# Patient Record
Sex: Female | Born: 1950
Health system: Southern US, Community
[De-identification: ages and names within clinical notes are randomized; demographics above are authoritative.]

## PROBLEM LIST (undated history)

## (undated) DIAGNOSIS — K219 Gastro-esophageal reflux disease without esophagitis: Secondary | ICD-10-CM

## (undated) DIAGNOSIS — I5022 Chronic systolic (congestive) heart failure: Secondary | ICD-10-CM

## (undated) DIAGNOSIS — H269 Unspecified cataract: Secondary | ICD-10-CM

## (undated) DIAGNOSIS — I341 Nonrheumatic mitral (valve) prolapse: Secondary | ICD-10-CM

## (undated) DIAGNOSIS — I219 Acute myocardial infarction, unspecified: Secondary | ICD-10-CM

## (undated) DIAGNOSIS — G4733 Obstructive sleep apnea (adult) (pediatric): Secondary | ICD-10-CM

## (undated) DIAGNOSIS — F419 Anxiety disorder, unspecified: Secondary | ICD-10-CM

## (undated) DIAGNOSIS — Z974 Presence of external hearing-aid: Secondary | ICD-10-CM

## (undated) DIAGNOSIS — M5135 Other intervertebral disc degeneration, thoracolumbar region: Secondary | ICD-10-CM

## (undated) DIAGNOSIS — K7581 Nonalcoholic steatohepatitis (NASH): Secondary | ICD-10-CM

## (undated) DIAGNOSIS — K7402 Hepatic fibrosis, advanced fibrosis: Secondary | ICD-10-CM

## (undated) DIAGNOSIS — Z87442 Personal history of urinary calculi: Secondary | ICD-10-CM

## (undated) DIAGNOSIS — D869 Sarcoidosis, unspecified: Secondary | ICD-10-CM

## (undated) DIAGNOSIS — I252 Old myocardial infarction: Secondary | ICD-10-CM

## (undated) DIAGNOSIS — K760 Fatty (change of) liver, not elsewhere classified: Secondary | ICD-10-CM

## (undated) DIAGNOSIS — I10 Essential (primary) hypertension: Secondary | ICD-10-CM

## (undated) DIAGNOSIS — R748 Abnormal levels of other serum enzymes: Secondary | ICD-10-CM

## (undated) DIAGNOSIS — Z8719 Personal history of other diseases of the digestive system: Secondary | ICD-10-CM

## (undated) DIAGNOSIS — R7303 Prediabetes: Secondary | ICD-10-CM

## (undated) DIAGNOSIS — Z9889 Other specified postprocedural states: Secondary | ICD-10-CM

## (undated) DIAGNOSIS — K8020 Calculus of gallbladder without cholecystitis without obstruction: Secondary | ICD-10-CM

## (undated) DIAGNOSIS — C4490 Unspecified malignant neoplasm of skin, unspecified: Secondary | ICD-10-CM

## (undated) DIAGNOSIS — I4510 Unspecified right bundle-branch block: Secondary | ICD-10-CM

## (undated) DIAGNOSIS — I2510 Atherosclerotic heart disease of native coronary artery without angina pectoris: Secondary | ICD-10-CM

## (undated) DIAGNOSIS — M1990 Unspecified osteoarthritis, unspecified site: Secondary | ICD-10-CM

## (undated) DIAGNOSIS — T7840XA Allergy, unspecified, initial encounter: Secondary | ICD-10-CM

## (undated) HISTORY — PX: CARDIAC CATHETERIZATION: SHX172

## (undated) HISTORY — DX: Hepatic fibrosis, advanced fibrosis: K74.02

## (undated) HISTORY — DX: Atherosclerotic heart disease of native coronary artery without angina pectoris: I25.10

## (undated) HISTORY — DX: Unspecified right bundle-branch block: I45.10

## (undated) HISTORY — DX: Nonalcoholic steatohepatitis (NASH): K75.81

## (undated) HISTORY — DX: Calculus of gallbladder without cholecystitis without obstruction: K80.20

## (undated) HISTORY — DX: Fatty (change of) liver, not elsewhere classified: K76.0

## (undated) HISTORY — DX: Obstructive sleep apnea (adult) (pediatric): G47.33

## (undated) HISTORY — PX: ROTATOR CUFF REPAIR: SHX139

## (undated) HISTORY — DX: Presence of external hearing-aid: Z97.4

## (undated) HISTORY — DX: Old myocardial infarction: I25.2

## (undated) HISTORY — DX: Anxiety disorder, unspecified: F41.9

## (undated) HISTORY — DX: Chronic systolic (congestive) heart failure: I50.22

## (undated) HISTORY — DX: Other intervertebral disc degeneration, thoracolumbar region: M51.35

## (undated) HISTORY — DX: Nonrheumatic mitral (valve) prolapse: I34.1

## (undated) HISTORY — DX: Sarcoidosis, unspecified: D86.9

## (undated) HISTORY — DX: Allergy, unspecified, initial encounter: T78.40XA

## (undated) HISTORY — DX: Abnormal levels of other serum enzymes: R74.8

---

## 1995-11-15 HISTORY — PX: BLADDER SURGERY: SHX569

## 1998-06-25 ENCOUNTER — Other Ambulatory Visit: Admission: RE | Admit: 1998-06-25 | Discharge: 1998-06-25 | Payer: Self-pay | Admitting: Obstetrics & Gynecology

## 1999-09-24 ENCOUNTER — Other Ambulatory Visit: Admission: RE | Admit: 1999-09-24 | Discharge: 1999-09-24 | Payer: Self-pay | Admitting: Obstetrics and Gynecology

## 2000-08-19 ENCOUNTER — Ambulatory Visit (HOSPITAL_COMMUNITY): Admission: RE | Admit: 2000-08-19 | Discharge: 2000-08-19 | Payer: Self-pay | Admitting: Family Medicine

## 2000-08-19 ENCOUNTER — Encounter: Payer: Self-pay | Admitting: Family Medicine

## 2000-10-07 ENCOUNTER — Other Ambulatory Visit: Admission: RE | Admit: 2000-10-07 | Discharge: 2000-10-07 | Payer: Self-pay | Admitting: Obstetrics and Gynecology

## 2001-08-16 HISTORY — PX: KNEE SURGERY: SHX244

## 2001-10-20 ENCOUNTER — Other Ambulatory Visit: Admission: RE | Admit: 2001-10-20 | Discharge: 2001-10-20 | Payer: Self-pay | Admitting: Obstetrics and Gynecology

## 2002-03-16 ENCOUNTER — Ambulatory Visit (HOSPITAL_BASED_OUTPATIENT_CLINIC_OR_DEPARTMENT_OTHER): Admission: RE | Admit: 2002-03-16 | Discharge: 2002-03-16 | Payer: Self-pay | Admitting: Orthopedic Surgery

## 2002-11-29 ENCOUNTER — Ambulatory Visit (HOSPITAL_COMMUNITY): Admission: RE | Admit: 2002-11-29 | Discharge: 2002-11-29 | Payer: Self-pay | Admitting: Obstetrics and Gynecology

## 2002-11-29 ENCOUNTER — Encounter: Payer: Self-pay | Admitting: Obstetrics and Gynecology

## 2003-08-17 HISTORY — PX: SHOULDER SURGERY: SHX246

## 2004-07-13 ENCOUNTER — Ambulatory Visit: Payer: Self-pay | Admitting: Family Medicine

## 2004-07-20 ENCOUNTER — Ambulatory Visit: Payer: Self-pay | Admitting: Internal Medicine

## 2005-02-02 ENCOUNTER — Ambulatory Visit: Payer: Self-pay | Admitting: Family Medicine

## 2005-02-10 ENCOUNTER — Ambulatory Visit: Payer: Self-pay | Admitting: Family Medicine

## 2005-02-18 ENCOUNTER — Ambulatory Visit: Payer: Self-pay | Admitting: Family Medicine

## 2005-03-09 ENCOUNTER — Ambulatory Visit: Payer: Self-pay | Admitting: Family Medicine

## 2005-06-08 ENCOUNTER — Ambulatory Visit: Payer: Self-pay | Admitting: Family Medicine

## 2005-06-22 ENCOUNTER — Ambulatory Visit: Payer: Self-pay | Admitting: Family Medicine

## 2006-03-16 LAB — HM MAMMOGRAPHY: HM Mammogram: NORMAL

## 2006-03-29 ENCOUNTER — Ambulatory Visit: Payer: Self-pay | Admitting: Family Medicine

## 2006-04-29 ENCOUNTER — Ambulatory Visit: Payer: Self-pay | Admitting: Family Medicine

## 2006-11-08 ENCOUNTER — Ambulatory Visit: Payer: Self-pay | Admitting: Family Medicine

## 2006-11-10 ENCOUNTER — Ambulatory Visit: Payer: Self-pay | Admitting: Family Medicine

## 2006-11-11 DIAGNOSIS — M949 Disorder of cartilage, unspecified: Secondary | ICD-10-CM

## 2006-11-11 DIAGNOSIS — M899 Disorder of bone, unspecified: Secondary | ICD-10-CM

## 2009-06-19 ENCOUNTER — Ambulatory Visit: Payer: Self-pay | Admitting: Family Medicine

## 2009-06-19 DIAGNOSIS — N390 Urinary tract infection, site not specified: Secondary | ICD-10-CM

## 2009-06-19 LAB — CONVERTED CEMR LAB
Bilirubin Urine: NEGATIVE
Glucose, Urine, Semiquant: NEGATIVE
Ketones, urine, test strip: NEGATIVE
Nitrite: POSITIVE
Protein, U semiquant: 30
Specific Gravity, Urine: 1.015
Urobilinogen, UA: 0.2
pH: 6

## 2009-06-27 DIAGNOSIS — I059 Rheumatic mitral valve disease, unspecified: Secondary | ICD-10-CM

## 2009-06-27 DIAGNOSIS — J309 Allergic rhinitis, unspecified: Secondary | ICD-10-CM

## 2009-06-27 DIAGNOSIS — K649 Unspecified hemorrhoids: Secondary | ICD-10-CM | POA: Insufficient documentation

## 2009-06-27 DIAGNOSIS — F411 Generalized anxiety disorder: Secondary | ICD-10-CM

## 2009-06-27 DIAGNOSIS — I2510 Atherosclerotic heart disease of native coronary artery without angina pectoris: Secondary | ICD-10-CM

## 2010-01-30 ENCOUNTER — Emergency Department (HOSPITAL_COMMUNITY): Admission: EM | Admit: 2010-01-30 | Discharge: 2010-01-30 | Payer: Self-pay | Admitting: Emergency Medicine

## 2010-08-27 ENCOUNTER — Other Ambulatory Visit: Payer: Self-pay | Admitting: Family Medicine

## 2010-08-27 ENCOUNTER — Ambulatory Visit
Admission: RE | Admit: 2010-08-27 | Discharge: 2010-08-27 | Payer: Self-pay | Source: Home / Self Care | Attending: Family Medicine | Admitting: Family Medicine

## 2010-08-27 DIAGNOSIS — N949 Unspecified condition associated with female genital organs and menstrual cycle: Secondary | ICD-10-CM | POA: Insufficient documentation

## 2010-08-27 DIAGNOSIS — I10 Essential (primary) hypertension: Secondary | ICD-10-CM | POA: Insufficient documentation

## 2010-08-27 LAB — BASIC METABOLIC PANEL
BUN: 14 mg/dL (ref 6–23)
CO2: 30 mEq/L (ref 19–32)
Calcium: 9.4 mg/dL (ref 8.4–10.5)
Chloride: 100 mEq/L (ref 96–112)
Creatinine, Ser: 0.8 mg/dL (ref 0.4–1.2)
GFR: 77.27 mL/min (ref >60.00)
Glucose, Bld: 94 mg/dL (ref 70–99)
Potassium: 3.2 mEq/L — ABNORMAL LOW (ref 3.5–5.1)
Sodium: 140 mEq/L (ref 135–145)

## 2010-08-27 LAB — LIPID PANEL
Cholesterol: 182 mg/dL (ref 0–200)
HDL: 29 mg/dL — ABNORMAL LOW (ref >39.00)
LDL Cholesterol: 128 mg/dL — ABNORMAL HIGH (ref 0–99)
Total CHOL/HDL Ratio: 6
Triglycerides: 126 mg/dL (ref 0.0–149.0)
VLDL: 25.2 mg/dL (ref 0.0–40.0)

## 2010-09-01 ENCOUNTER — Encounter: Payer: Self-pay | Admitting: Family Medicine

## 2010-09-17 NOTE — Assessment & Plan Note (Signed)
Summary: CPX,TRANSFER FROM [facility]   Vital Signs:  Patient profile:   60 year old female Height:      62.25 inches Weight:      153.50 pounds BMI:     27.95 Temp:     98.1 degrees F oral Pulse rate:   63 / minute Pulse rhythm:   regular BP sitting:   140 / 90  (left arm) Cuff size:   regular  Vitals Entered By: Sherrian Divers CMA Deborra Medina) (August 27, 2010 8:25 AM) CC: complete physicial, transfer from [place]   History of Present Illness: 60 yo new to me here for CPX.  Was seeing [facility] years ago, last 2-3 years has been seeing Dr. Tamala Julian with [facility].  Well woman- s/p hysterectomy in 2007 for fibroids. Mammogram scheduled for next week. UTD on vaccinations (awaiting records) and had a colonoscopy in 2008.  Has had some vulvular irriation lately.  Increase in urge incontinence.  s/p bladder sling in 2007.  She is using new panty liners and wonders if that is the cause.  HTN- on Metoprolol 100 mg daily, HCTZ 12. 5 mg daily.  Has not had her medication today. NO CP, blurred vision or SOB.  No LE edema.  Osteopenia- stopped taking her Boniva because it was too expensive.  Supposed to be taking Fosamax but kept forgetting, needs refills.  Not sure when last DEXA was,.  Current Medications (verified): 1)  Glucosamine-Chondroitin  Caps (Glucosamine-Chondroit-Vit C-Mn) .... Otc As Directed. 2)  Calcium Carbonate-Vitamin D 600-400 Mg-Unit  Tabs (Calcium Carbonate-Vitamin D) .... One Daily 3)  Multivitamins   Tabs (Multiple Vitamin) .... One Daily 4)  Metoprolol Tartrate 100 Mg Tabs (Metoprolol Tartrate) .... Take One Tablet By Mouth Daily 5)  Hydrochlorothiazide 25 Mg Tabs (Hydrochlorothiazide) .... Take 1/2 Tablet By Mouth Daily 6)  Meloxicam 15 Mg Tabs (Meloxicam) .... Take One Tablet By Mouth Daily 7)  Ciprofloxacin Hcl 500 Mg Tabs (Ciprofloxacin Hcl) .... Take 1 Two Times A Day For Uti 8)  Fish Oil 1200 Mg Caps (Omega-3 Fatty Acids) .... Take One Tablet By Mouth  Daily 9)  Vitamin D3 1000 Unit Tabs (Cholecalciferol) .... Take One Tablet By Mouth Daily 10)  Claritin 10 Mg Tabs (Loratadine) .... Take One Tablet By Mouth Daily 11)  Womens Multivitamin Plus  Tabs (Multiple Vitamins-Minerals) .... Take One Tablet By Mouth Daily 12)  Clobetasol Propionate 0.05 % Crea (Clobetasol Propionate) .... Apply On Area Nightly X 4 Weeks or Until Rash Has Resolved. 13)  Alendronate Sodium 70 Mg  Tabs (Alendronate Sodium) .Marland Kitchen.. 1 By Mouth Q Week . Take With 8 Ounces of Water On and Empty Stomach  Allergies: 1)  ! Codeine 2)  ! Penicillin  Past History:  Past Medical History: Last updated: 06/27/2009 Mitral valve prolapse hemorrhoids CAD allergic rhinitis anxiety osteopenia 11/11/2006  Past Surgical History: Last updated: 06/27/2009 04-17-97 Echo EF 55% trivial MVP ant leaflet normal LVF 11-15-1995 TAH -HRT 11-15-1995 Anterior bladder neck suspension, stress urinary incontinence 12-14-1992 2 D Echo 1989 Flex sig 09-16-2000 Treadmill stress + 09-16-2000 Cardiolite negative 2003 Knee surgery torn meniscus 12-15-2002 colonoscopy 11-11-2006 Dexa  Family History: Last updated: 06/27/2009 Father: Hypertension Mother: MVP Siblings: one brother- no health issues noted  Social History: Last updated: 06/19/2009 Marital Status: Married--divorced Children:  Occupation:worked  at [employer], now in peds at front desk  Risk Factors: Smoking Status: never (06/27/2009)  Review of Systems      See HPI General:  Denies malaise. Eyes:  Denies blurring.  ENT:  Denies difficulty swallowing. CV:  Denies chest pain or discomfort. Resp:  Denies shortness of breath. GI:  Denies abdominal pain. GU:  Complains of nocturia and urinary frequency. MS:  Denies joint pain, joint redness, and joint swelling. Derm:  Complains of rash. Neuro:  Denies headaches. Psych:  Denies anxiety and depression. Endo:  Denies cold intolerance and heat intolerance. Heme:  Denies  abnormal bruising and bleeding.  Physical Exam  General:  alert, well-developed, well-nourished, and well-hydrated.   Head:  normocephalic and atraumatic.   Eyes:  vision grossly intact, pupils equal, pupils round, and pupils reactive to light.   Ears:  R ear normal and L ear normal.   Nose:  nose piercing noted.   Mouth:  good dentition.   Neck:  No deformities, masses, or tenderness noted. Lungs:  Normal respiratory effort, chest expands symmetrically. Lungs are clear to auscultation, no crackles or wheezes. Heart:  normal rate, regular rhythm, and no murmur.   Abdomen:  Bowel sounds positive,abdomen soft and non-tender without masses, organomegaly or hernias noted. Rectal:  no external abnormalities.   Genitalia:  irritated, raised plaque on left vuluvlar area.no vaginal discharge and mucosa pink and moist.   Msk:  No deformity or scoliosis noted of thoracic or lumbar spine.   Extremities:  no edema Neurologic:  alert & oriented X3 and gait normal.   Skin:  Intact without suspicious lesions or rashes Psych:  normally interactive and subdued.     Impression & Recommendations:  Problem # 1:  Preventive Health Care (ICD-V70.0) Reviewed preventive care protocols, scheduled due services, and updated immunizations Discussed nutrition, exercise, diet, and healthy lifestyle.  Awaiting records, will check labs today.  Problem # 2:  HYPERTENSION, BENIGN (ICD-401.1) Assessment: Unchanged BMET today. Her updated medication list for this problem includes:    Metoprolol Tartrate 100 Mg Tabs (Metoprolol tartrate) .Marland Kitchen... Take one tablet by mouth daily    Hydrochlorothiazide 25 Mg Tabs (Hydrochlorothiazide) .Marland Kitchen... Take 1/2 tablet by mouth daily  Orders: Venipuncture (49449) TLB-BMP (Basic Metabolic Panel-BMET) ([id number])  Problem # 3:  OSTEOPENIA (ICD-733.90)  The following medications were removed from the medication list:    Boniva 150 Mg Tabs (Ibandronate sodium) ..... One   tablet monthly Her updated medication list for this problem includes:    Calcium Carbonate-vitamin D 600-400 Mg-unit Tabs (Calcium carbonate-vitamin d) ..... One daily    Vitamin D3 1000 Unit Tabs (Cholecalciferol) .Marland Kitchen... Take one tablet by mouth daily    Alendronate Sodium 70 Mg Tabs (Alendronate sodium) .Marland Kitchen... 1 by mouth q week . take with 8 ounces of water on and empty stomach  Problem # 4:  OSTEOPENIA (ICD-733.90) Assessment: Unchanged refilled her Alendronate.  Likely needs repeat DEXA, awaiting records. The following medications were removed from the medication list:    Boniva 150 Mg Tabs (Ibandronate sodium) ..... One  tablet monthly Her updated medication list for this problem includes:    Calcium Carbonate-vitamin D 600-400 Mg-unit Tabs (Calcium carbonate-vitamin d) ..... One daily    Vitamin D3 1000 Unit Tabs (Cholecalciferol) .Marland Kitchen... Take one tablet by mouth daily    Alendronate Sodium 70 Mg Tabs (Alendronate sodium) .Marland Kitchen... 1 by mouth q week . take with 8 ounces of water on and empty stomach  Problem # 5:  UNSPEC SYMPTOM ASSOC W/FEMALE GENITAL ORGANS (ICD-625.9) Assessment: New ? lichen planus vs contact dermatitis. advised using scented free panty liners, clobetasol nightly.  Follow up in 1-2 months.  Complete Medication List: 1)  Glucosamine-chondroitin Caps (Glucosamine-chondroit-vit  c-mn) .... Otc as directed. 2)  Calcium Carbonate-vitamin D 600-400 Mg-unit Tabs (Calcium carbonate-vitamin d) .... One daily 3)  Multivitamins Tabs (Multiple vitamin) .... One daily 4)  Metoprolol Tartrate 100 Mg Tabs (Metoprolol tartrate) .... Take one tablet by mouth daily 5)  Hydrochlorothiazide 25 Mg Tabs (Hydrochlorothiazide) .... Take 1/2 tablet by mouth daily 6)  Meloxicam 15 Mg Tabs (Meloxicam) .... Take one tablet by mouth daily 7)  Ciprofloxacin Hcl 500 Mg Tabs (Ciprofloxacin hcl) .... Take 1 two times a day for uti 8)  Fish Oil 1200 Mg Caps (Omega-3 fatty acids) .... Take one tablet by  mouth daily 9)  Vitamin D3 1000 Unit Tabs (Cholecalciferol) .... Take one tablet by mouth daily 10)  Claritin 10 Mg Tabs (Loratadine) .... Take one tablet by mouth daily 11)  Womens Multivitamin Plus Tabs (Multiple vitamins-minerals) .... Take one tablet by mouth daily 12)  Clobetasol Propionate 0.05 % Crea (Clobetasol propionate) .... Apply on area nightly x 4 weeks or until rash has resolved. 13)  Alendronate Sodium 70 Mg Tabs (Alendronate sodium) .Marland Kitchen.. 1 by mouth q week . take with 8 ounces of water on and empty stomach  Other Orders: TLB-Lipid Panel ([id number])  Patient Instructions: 1)  Great to meet you. 2)  Please follow up in 1-2 months for your rash.   Prescriptions: ALENDRONATE SODIUM 70 MG  TABS (ALENDRONATE SODIUM) 1 by mouth q week . Take with 8 ounces of water on and empty stomach  #12 x 3   Entered and Authorized by:   Arnette Norris MD   Signed by:   Arnette Norris MD on 08/27/2010   Method used:   Electronically to        [facility] (retail)       [address]       [place], [place]  [address]       Ph: [phone]       Fax: [phone]   RxID:   [id number] CLOBETASOL PROPIONATE 0.05 % CREA (CLOBETASOL PROPIONATE) Apply on area nightly x 4 weeks or until rash has resolved.  #30 g x 0   Entered and Authorized by:   Arnette Norris MD   Signed by:   Arnette Norris MD on 08/27/2010   Method used:   Electronically to        [facility] (retail)       [address]       [place], [place]  [address]       Ph: [phone]       Fax: [phone]   RxID:   [id number]    Orders Added: 1)  Venipuncture [29924] 2)  TLB-Lipid Panel [[id number]] 3)  TLB-BMP (Basic Metabolic Panel-BMET) [[id number]] 4)  Est. Patient 40-64 years [99396] 5)  Est. Patient Level III [79892]    Current Allergies (reviewed today): ! CODEINE ! PENICILLIN  Herpes Zoster Result Date:  11/19/2009 Herpes Zoster Result:  historical Colonoscopy Result Date:  08/31/2006 Colonoscopy  Result:  historical PAP Next Due:  Not Indicated

## 2010-09-22 ENCOUNTER — Ambulatory Visit (INDEPENDENT_AMBULATORY_CARE_PROVIDER_SITE_OTHER): Payer: BC Managed Care – PPO | Admitting: Family Medicine

## 2010-09-22 ENCOUNTER — Encounter: Payer: Self-pay | Admitting: Family Medicine

## 2010-09-22 DIAGNOSIS — H5710 Ocular pain, unspecified eye: Secondary | ICD-10-CM | POA: Insufficient documentation

## 2010-09-23 NOTE — Miscellaneous (Signed)
Summary: ROI  ROI   Imported By: Jamelle Haring 09/18/2010 08:39:04  _____________________________________________________________________  External Attachment:    Type:   Image     Comment:   External Document

## 2010-09-25 ENCOUNTER — Telehealth: Payer: Self-pay | Admitting: Family Medicine

## 2010-10-01 NOTE — Assessment & Plan Note (Signed)
Summary: EYE/CLE  [employer]   Vital Signs:  Patient profile:   60 year old female Height:      62.25 inches Weight:      157.50 pounds BMI:     28.68 Temp:     98.6 degrees F oral Pulse rate:   67 / minute Pulse rhythm:   regular BP sitting:   110 / 84  (right arm) Cuff size:   regular  Vitals Entered By: Sherrian Divers CMA Deborra Medina) (September 22, 2010 8:20 AM) CC: eye irritated   History of Present Illness: 60 yo here for 1 week of right eye irritation.  Noticed it in the middle of the day. She wears contacts, so has not warn them for past few days. Conjunctiva is pink, sensitive to light, tearing a little. Yesterday, felt a little burred vision that has resolved.  Itches a little. No discharge.    Current Medications (verified): 1)  Glucosamine-Chondroitin  Caps (Glucosamine-Chondroit-Vit C-Mn) .... Otc As Directed. 2)  Calcium Carbonate-Vitamin D 600-400 Mg-Unit  Tabs (Calcium Carbonate-Vitamin D) .... One Daily 3)  Multivitamins   Tabs (Multiple Vitamin) .... One Daily 4)  Metoprolol Tartrate 100 Mg Tabs (Metoprolol Tartrate) .... Take One Tablet By Mouth Daily 5)  Hydrochlorothiazide 25 Mg Tabs (Hydrochlorothiazide) .... Take 1/2 Tablet By Mouth Daily 6)  Meloxicam 15 Mg Tabs (Meloxicam) .... Take One Tablet By Mouth Daily 7)  Ciprofloxacin Hcl 500 Mg Tabs (Ciprofloxacin Hcl) .... Take 1 Two Times A Day For Uti 8)  Fish Oil 1200 Mg Caps (Omega-3 Fatty Acids) .... Take One Tablet By Mouth Daily 9)  Vitamin D3 1000 Unit Tabs (Cholecalciferol) .... Take One Tablet By Mouth Daily 10)  Claritin 10 Mg Tabs (Loratadine) .... Take One Tablet By Mouth Daily 11)  Womens Multivitamin Plus  Tabs (Multiple Vitamins-Minerals) .... Take One Tablet By Mouth Daily 12)  Clobetasol Propionate 0.05 % Crea (Clobetasol Propionate) .... Apply On Area Nightly X 4 Weeks or Until Rash Has Resolved. 13)  Alendronate Sodium 70 Mg  Tabs (Alendronate Sodium) .Marland Kitchen.. 1 By Mouth Q Week . Take With 8 Ounces of  Water On and Empty Stomach 14)  Bleph-10 10 % Soln (Sulfacetamide Sodium) .Marland Kitchen.. 1-2 Drops in Affected Eye Three Times A Day X 7 Days  Allergies: 1)  ! Codeine 2)  ! Penicillin  Past History:  Past Medical History: Last updated: 06/27/2009 Mitral valve prolapse hemorrhoids CAD allergic rhinitis anxiety osteopenia 11/11/2006  Past Surgical History: Last updated: 06/27/2009 04-17-97 Echo EF 55% trivial MVP ant leaflet normal LVF 11-15-1995 TAH -HRT 11-15-1995 Anterior bladder neck suspension, stress urinary incontinence 12-14-1992 2 D Echo 1989 Flex sig 09-16-2000 Treadmill stress + 09-16-2000 Cardiolite negative 2003 Knee surgery torn meniscus 12-15-2002 colonoscopy 11-11-2006 Dexa  Family History: Last updated: 06/27/2009 Father: Hypertension Mother: MVP Siblings: one brother- no health issues noted  Social History: Last updated: 06/19/2009 Marital Status: Married--divorced Children:  Occupation:worked  at [employer], now in peds at front desk  Risk Factors: Smoking Status: never (06/27/2009)  Review of Systems      See HPI Eyes:  Complains of blurring, eye irritation, itching, and light sensitivity; denies discharge and double vision.  Physical Exam  General:  alert, well-developed, well-nourished, and well-hydrated.   Eyes:  Right conjunctival injection, some tearing. no photophobia. PERRL Psych:  normally interactive and subdued.     Impression & Recommendations:  Problem # 1:  EYE PAIN, RIGHT (ICD-379.91) Assessment New Most consistent with possible corneal abrasion.  Advised going  to see her optometrist right away for dilated eye exam.  Will give abx drops until she can see him. Red flags given requiring emergent follow up.  Complete Medication List: 1)  Glucosamine-chondroitin Caps (Glucosamine-chondroit-vit c-mn) .... Otc as directed. 2)  Calcium Carbonate-vitamin D 600-400 Mg-unit Tabs (Calcium carbonate-vitamin d) .... One daily 3)  Multivitamins  Tabs (Multiple vitamin) .... One daily 4)  Metoprolol Tartrate 100 Mg Tabs (Metoprolol tartrate) .... Take one tablet by mouth daily 5)  Hydrochlorothiazide 25 Mg Tabs (Hydrochlorothiazide) .... Take 1/2 tablet by mouth daily 6)  Meloxicam 15 Mg Tabs (Meloxicam) .... Take one tablet by mouth daily 7)  Ciprofloxacin Hcl 500 Mg Tabs (Ciprofloxacin hcl) .... Take 1 two times a day for uti 8)  Fish Oil 1200 Mg Caps (Omega-3 fatty acids) .... Take one tablet by mouth daily 9)  Vitamin D3 1000 Unit Tabs (Cholecalciferol) .... Take one tablet by mouth daily 10)  Claritin 10 Mg Tabs (Loratadine) .... Take one tablet by mouth daily 11)  Womens Multivitamin Plus Tabs (Multiple vitamins-minerals) .... Take one tablet by mouth daily 12)  Clobetasol Propionate 0.05 % Crea (Clobetasol propionate) .... Apply on area nightly x 4 weeks or until rash has resolved. 13)  Alendronate Sodium 70 Mg Tabs (Alendronate sodium) .Marland Kitchen.. 1 by mouth q week . take with 8 ounces of water on and empty stomach 14)  Bleph-10 10 % Soln (Sulfacetamide sodium) .Marland Kitchen.. 1-2 drops in affected eye three times a day x 7 days Prescriptions: BLEPH-10 10 % SOLN (SULFACETAMIDE SODIUM) 1-2 drops in affected eye three times a day x 7 days  #1 x 0   Entered and Authorized by:   Arnette Norris MD   Signed by:   Arnette Norris MD on 09/22/2010   Method used:   Electronically to        [facility] (retail)       [address]       [place], [place]  [address]       Ph: [phone]       Fax: [phone]   RxID:   [id number]    Orders Added: 1)  Est. Patient Level III [25427]    Current Allergies (reviewed today): ! CODEINE ! PENICILLIN

## 2010-10-01 NOTE — Progress Notes (Signed)
Summary: requests flonase, albuterol  Phone Note Refill Request Call back at Home Phone [phone] Message from:  Patient  Refills Requested: Medication #1:  albuterol inhaler  Medication #2:  flonase Phoned requests from pt.  She says she has gotten these in the past from [facility], they are not on med list.  Uses [facility].  OK to wait until 2/13.  Initial call taken by: Marty Heck CMA, AAMA,  September 25, 2010 11:35 AM    New/Updated Medications: FLUTICASONE PROPIONATE 50 MCG/ACT  SUSP (FLUTICASONE PROPIONATE) 2 sprays each nostril once daily PROAIR HFA 108 (90 BASE) MCG/ACT  AERS (ALBUTEROL SULFATE) 2 inh q4h as needed shortness of breath Prescriptions: PROAIR HFA 108 (90 BASE) MCG/ACT  AERS (ALBUTEROL SULFATE) 2 inh q4h as needed shortness of breath  #1 x 0   Entered and Authorized by:   Arnette Norris MD   Signed by:   Arnette Norris MD on 09/26/2010   Method used:   Electronically to        [facility] (retail)       [address]       [place], [place]  [address]       Ph: [phone]       Fax: [phone]   RxID:   [id number] FLUTICASONE PROPIONATE 50 MCG/ACT  SUSP (FLUTICASONE PROPIONATE) 2 sprays each nostril once daily  #1 vial x 3   Entered and Authorized by:   Arnette Norris MD   Signed by:   Arnette Norris MD on 09/26/2010   Method used:   Electronically to        [facility] (retail)       [address]       [place], [place]  [address]       Ph: [phone]       Fax: [phone]   RxID:   [id number]

## 2010-10-06 ENCOUNTER — Telehealth: Payer: Self-pay | Admitting: Family Medicine

## 2010-10-13 NOTE — Progress Notes (Signed)
Summary: regarding metoprolol  Phone Note Call from Patient Call back at Home Phone [phone]   Caller: Patient Summary of Call: Pt says when she got her metoprolol refilled it was for tartrate and she says she has always taken the succinate.  Chart says it was changed on 08/27/10, but I didnt see anything in the chart note from that date.  [facility]ses [facility]. Initial call taken by: Marty Heck CMA, [place],  October 06, 2010 9:26 AM  Follow-up for Phone Call        not sure why it was changed but should not cause harm.  Please refill her old refill and change in chart. Arnette Norris MD  October 06, 2010 9:30 AM  Spoke with April at [facility], according to there records patient has been getting succinate.  Refilled this medication # 30 with 6 refills and changed it on medication list.  Patient notified.  Follow-up by: Sherrian Divers CMA Deborra Medina),  October 06, 2010 9:49 AM    New/Updated Medications: METOPROLOL SUCCINATE 100 MG XR24H-TAB (METOPROLOL SUCCINATE) take one tablet by mouth daily  Current Allergies (reviewed today): ! CODEINE ! PENICILLIN

## 2010-10-14 ENCOUNTER — Encounter: Payer: Self-pay | Admitting: Family Medicine

## 2010-11-01 LAB — COMPREHENSIVE METABOLIC PANEL
ALT: 29 U/L (ref 0–35)
AST: 27 U/L (ref 0–37)
Albumin: 3.8 g/dL (ref 3.5–5.2)
Alkaline Phosphatase: 97 U/L (ref 39–117)
BUN: 14 mg/dL (ref 6–23)
Chloride: 98 mEq/L (ref 96–112)
GFR calc Af Amer: >60 mL/min (ref >60)
Potassium: 3.7 mEq/L (ref 3.5–5.1)
Sodium: 135 mEq/L (ref 135–145)
Total Bilirubin: 1.1 mg/dL (ref 0.3–1.2)

## 2010-11-01 LAB — URINALYSIS, ROUTINE W REFLEX MICROSCOPIC
Glucose, UA: NEGATIVE mg/dL
Hgb urine dipstick: NEGATIVE
Ketones, ur: NEGATIVE mg/dL
Protein, ur: NEGATIVE mg/dL
pH: 5.5 (ref 5.0–8.0)

## 2010-11-01 LAB — DIFFERENTIAL
Basophils Absolute: 0 10*3/uL (ref 0.0–0.1)
Basophils Relative: 0 % (ref 0–1)
Eosinophils Absolute: 0 10*3/uL (ref 0.0–0.7)
Eosinophils Relative: 0 % (ref 0–5)
Monocytes Absolute: 1.2 10*3/uL — ABNORMAL HIGH (ref 0.1–1.0)

## 2010-11-01 LAB — CK TOTAL AND CKMB (NOT AT ARMC): CK, MB: 0.6 ng/mL (ref 0.3–4.0)

## 2010-11-01 LAB — CBC
HCT: 41.6 % (ref 36.0–46.0)
Hemoglobin: 14 g/dL (ref 12.0–15.0)
RDW: 14.2 % (ref 11.5–15.5)

## 2011-01-01 ENCOUNTER — Other Ambulatory Visit: Payer: Self-pay | Admitting: *Deleted

## 2011-01-01 MED ORDER — HYDROCHLOROTHIAZIDE 25 MG PO TABS: ORAL_TABLET | ORAL | Status: DC

## 2011-01-01 NOTE — Op Note (Signed)
   [name], Hannah Vasquez                             ACCOUNT NO.:  [id number]   MEDICAL RECORD NO.:  [id number]                   PATIENT TYPE:   LOCATION:                                       FACILITY:   PHYSICIAN:  Yvette Rack., M.D.             DATE OF BIRTH:   DATE OF PROCEDURE:  DATE OF DISCHARGE:                                 OPERATIVE REPORT   INDICATIONS:  A 60 year old, MRI-proven left knee medial meniscus tear with  symptoms felt to be amenable to outpatient surgery.   PREOPERATIVE DIAGNOSIS:  Complex tear posterior horn of medial meniscus,  left knee.   POSTOPERATIVE DIAGNOSIS:  Complex tear posterior horn of medial meniscus,  left knee.   OPERATION:  Partial medial meniscectomy (40%).   SURGEON:  Lockie Pares, M.D.   ANESTHESIA:  General.   DESCRIPTION OF PROCEDURE:  Arthroscope through superior medial inferolateral  portal.  Systematic inspection of the patellofemoral joint, lateral  compartment of ACL, lateral meniscus was normal.  The pathology was confined  to the medial side of the knee, specifically the medial articular cartilage  was normal.  There was a complex tear of the posterior horn of the medial  meniscus.  It was more of a horizontal cleavage tear with extension into  some radial components.  This required resection of most of the posterior  horn, although a stable rim was left posteriorly.  It was estimated at most  that 40% of the meniscus was removed.  This was done with a combination of  basket forceps and motorized shaving instrument.  Again, the articular  cartilage was essentially normal.  Portals were closed with nylon and she  had Marcaine and morphine 25 cc with the majority of the local into the  joint.  Lightly compressive sterile dressing applied.                                               Yvette Rack., M.D.    WDC/MEDQ  D:  03/16/2002  T:  03/21/2002  Job:  [id number]

## 2011-01-04 ENCOUNTER — Other Ambulatory Visit: Payer: Self-pay | Admitting: *Deleted

## 2011-01-04 NOTE — Telephone Encounter (Signed)
Rx called to [facility] for HCTZ.

## 2011-05-17 ENCOUNTER — Other Ambulatory Visit: Payer: Self-pay | Admitting: *Deleted

## 2011-05-17 MED ORDER — METOPROLOL SUCCINATE ER 100 MG PO TB24: 100.0000 mg | ORAL_TABLET | Freq: Every day | ORAL | Status: DC

## 2011-05-17 NOTE — Telephone Encounter (Signed)
Rx sent electronically to pharmacy.  Medication added to med list.

## 2011-05-17 NOTE — Telephone Encounter (Signed)
Received refill request for Metoprolol, medication not on med list.  Patient last seen 09/2010.  Please advise.

## 2011-05-17 NOTE — Telephone Encounter (Signed)
Ok to refill. Please add to list.

## 2011-05-27 ENCOUNTER — Ambulatory Visit (INDEPENDENT_AMBULATORY_CARE_PROVIDER_SITE_OTHER): Payer: BC Managed Care – PPO

## 2011-05-27 DIAGNOSIS — Z23 Encounter for immunization: Secondary | ICD-10-CM

## 2011-09-08 ENCOUNTER — Encounter: Payer: Self-pay | Admitting: Family Medicine

## 2011-09-09 ENCOUNTER — Encounter: Payer: Self-pay | Admitting: Family Medicine

## 2011-09-09 ENCOUNTER — Ambulatory Visit (INDEPENDENT_AMBULATORY_CARE_PROVIDER_SITE_OTHER): Payer: BC Managed Care – PPO | Admitting: Family Medicine

## 2011-09-09 VITALS — BP 140/80 | HR 56 | Temp 97.7°F | Ht 62.0 in | Wt 142.8 lb

## 2011-09-09 DIAGNOSIS — Z0000 Encounter for general adult medical examination without abnormal findings: Secondary | ICD-10-CM

## 2011-09-09 DIAGNOSIS — F411 Generalized anxiety disorder: Secondary | ICD-10-CM

## 2011-09-09 DIAGNOSIS — Z1231 Encounter for screening mammogram for malignant neoplasm of breast: Secondary | ICD-10-CM

## 2011-09-09 DIAGNOSIS — M899 Disorder of bone, unspecified: Secondary | ICD-10-CM

## 2011-09-09 DIAGNOSIS — Z23 Encounter for immunization: Secondary | ICD-10-CM

## 2011-09-09 DIAGNOSIS — I10 Essential (primary) hypertension: Secondary | ICD-10-CM

## 2011-09-09 DIAGNOSIS — I2510 Atherosclerotic heart disease of native coronary artery without angina pectoris: Secondary | ICD-10-CM

## 2011-09-09 DIAGNOSIS — Z136 Encounter for screening for cardiovascular disorders: Secondary | ICD-10-CM

## 2011-09-09 LAB — BASIC METABOLIC PANEL
BUN: 17 mg/dL (ref 6–23)
Chloride: 103 mEq/L (ref 96–112)
Glucose, Bld: 98 mg/dL (ref 70–99)
Potassium: 3.5 mEq/L (ref 3.5–5.1)
Sodium: 141 mEq/L (ref 135–145)

## 2011-09-09 LAB — LIPID PANEL
Cholesterol: 216 mg/dL — ABNORMAL HIGH (ref 0–200)
HDL: 32.4 mg/dL — ABNORMAL LOW (ref >39.00)
Total CHOL/HDL Ratio: 7
VLDL: 30 mg/dL (ref 0.0–40.0)

## 2011-09-09 MED ORDER — TRAMADOL HCL 50 MG PO TABS: 50.0000 mg | ORAL_TABLET | Freq: Every day | ORAL | Status: DC

## 2011-09-09 NOTE — Progress Notes (Signed)
61 yo here for CPX.  Well woman- s/p hysterectomy in 2007 for fibroids. Mammogram scheduled for next week.  UTD on vaccinations and had a colonoscopy in 2008.  HTN- on Metoprolol 100 mg daily, HCTZ 12. 5 mg daily.  Has not had her medication today. NO CP, blurred vision or SOB.  No LE edema.  Osteopenia- stopped taking her Boniva because it was too expensive.  Taking Fosamax, due for DEXA scan.  Patient Active Problem List  Diagnoses  . ANXIETY STATE, UNSPECIFIED  . CAD  . MITRAL VALVE PROLAPSE  . HEMORRHOIDS  . ALLERGIC RHINITIS  . UTI  . OSTEOPENIA  . HYPERTENSION, BENIGN  . UNSPEC SYMPTOM ASSOC W/FEMALE GENITAL ORGANS  . EYE PAIN, RIGHT  . Routine general medical examination at a health care facility   Past Medical History  Diagnosis Date  . Mitral valve prolapse   . Hemorrhoids   . CAD (coronary artery disease)   . Allergy   . Anxiety   . Osteoporosis    Past Surgical History  Procedure Date  . Knee surgery 2003    torn meniscus  . Bladder surgery 11/15/1995    bladder neck suspension, urinary incontinence   History  Substance Use Topics  . Smoking status: Not on file  . Smokeless tobacco: Not on file  . Alcohol Use:    Family History  Problem Relation Age of Onset  . Mitral valve prolapse Mother   . Hypertension Father    Allergies  Allergen Reactions  . Codeine     REACTION: nausea and vomiting  . Penicillins     REACTION: rash   Current Outpatient Prescriptions on File Prior to Visit  Medication Sig Dispense Refill  . albuterol (PROVENTIL HFA;VENTOLIN HFA) 108 (90 BASE) MCG/ACT inhaler Inhale 2 puffs into the lungs every 4 (four) hours as needed.      Marland Kitchen alendronate (FOSAMAX) 70 MG tablet Take 70 mg by mouth every 7 (seven) days. Take with a full glass of water on an empty stomach.      . Calcium Carbonate-Vit D-Min 600-400 MG-UNIT TABS Take 1 tablet by mouth daily.      . Cholecalciferol (VITAMIN D3) 1000 UNITS CAPS Take 1 capsule by mouth  daily.      . fluticasone (FLONASE) 50 MCG/ACT nasal spray Place 2 sprays into the nose daily.      Marland Kitchen glucosamine-chondroitin 500-400 MG tablet OTC as directed      . hydrochlorothiazide 25 MG tablet Take one half by mouth daily  30 tablet  6  . loratadine (CLARITIN) 10 MG tablet Take 10 mg by mouth daily.      . meloxicam (MOBIC) 15 MG tablet Take 15 mg by mouth daily.      . metoprolol (TOPROL-XL) 100 MG 24 hr tablet Take 1 tablet (100 mg total) by mouth daily.  30 tablet  6  . Multiple Vitamin (MULTIVITAMIN) tablet Take 1 tablet by mouth daily.      . Omega-3 Fatty Acids (FISH OIL) 1200 MG CAPS Take 1 capsule by mouth daily.       The PMH, PSH, Social History, Family History, Medications, and allergies have been reviewed in [facility] and have been updated if relevant.  Review of Systems       See HPI Patient reports no  vision/ hearing changes,anorexia, weight change, fever ,adenopathy, persistant / recurrent hoarseness, swallowing issues, chest pain, edema,persistant / recurrent cough, hemoptysis, dyspnea(rest, exertional, paroxysmal nocturnal), gastrointestinal  bleeding (melena, rectal bleeding),  abdominal pain, excessive heart burn, GU symptoms(dysuria, hematuria, pyuria, voiding/incontinence  Issues) syncope, focal weakness, severe memory loss, concerning skin lesions, depression, anxiety, abnormal bruising/bleeding, major joint swelling, breast masses or abnormal vaginal bleeding.     Physical Exam BP 140/80  Pulse 56  Temp(Src) 97.7 F (36.5 C) (Oral)  Ht 5' 2"  (1.575 m)  Wt 142 lb 12 oz (64.751 kg)  BMI 26.11 kg/m2  General:  alert, well-developed, well-nourished, and well-hydrated.   Head:  normocephalic and atraumatic.   Eyes:  vision grossly intact, pupils equal, pupils round, and pupils reactive to light.   Ears:  R ear normal and L ear normal.   Nose:  nose piercing noted.   Mouth:  good dentition.   Neck:  No deformities, masses, or tenderness noted. Lungs:  Normal  respiratory effort, chest expands symmetrically. Lungs are clear to auscultation, no crackles or wheezes. Heart:  normal rate, regular rhythm, and no murmur.   Abdomen:  Bowel sounds positive,abdomen soft and non-tender without masses, organomegaly or hernias noted. Msk:  No deformity or scoliosis noted of thoracic or lumbar spine.   Extremities:  no edema Neurologic:  alert & oriented X3 and gait normal.   Skin:  Intact without suspicious lesions or rashes Psych:  normally interactive and subdued.    Assessment and Plan: 1. HYPERTENSION, BENIGN  Basic Metabolic Panel (BMET)  2. Routine general medical examination at a health care facility  Reviewed preventive care protocols, scheduled due services, and updated immunizations Discussed nutrition, exercise, diet, and healthy lifestyle.   MM Digital Screening Lipid Profile  3. OSTEOPENIA  DG Bone Density

## 2011-09-09 NOTE — Patient Instructions (Signed)
Good to see you. Please stop by to see Rosaria Ferries on your way out.

## 2011-09-09 NOTE — Progress Notes (Signed)
Addended by: Cathlean Cower on: 09/09/2011 09:09 AM   Modules accepted: Orders

## 2011-09-23 ENCOUNTER — Encounter: Payer: Self-pay | Admitting: Family Medicine

## 2011-12-06 ENCOUNTER — Other Ambulatory Visit: Payer: Self-pay | Admitting: *Deleted

## 2011-12-06 NOTE — Telephone Encounter (Signed)
Faxed request from [facility], last filled 11/09/11.

## 2011-12-07 MED ORDER — TRAMADOL HCL 50 MG PO TABS: 50.0000 mg | ORAL_TABLET | Freq: Every day | ORAL | Status: DC

## 2011-12-07 NOTE — Telephone Encounter (Signed)
Tramadol called to [facility].

## 2011-12-20 ENCOUNTER — Other Ambulatory Visit: Payer: Self-pay | Admitting: *Deleted

## 2011-12-20 MED ORDER — METOPROLOL SUCCINATE ER 100 MG PO TB24: 100.0000 mg | ORAL_TABLET | Freq: Every day | ORAL | Status: DC

## 2011-12-20 NOTE — Telephone Encounter (Signed)
Received faxed refill request from pharmacy. Refill sent to pharmacy electronically. 

## 2011-12-23 ENCOUNTER — Encounter: Payer: Self-pay | Admitting: Family Medicine

## 2011-12-23 ENCOUNTER — Encounter: Payer: Self-pay | Admitting: *Deleted

## 2011-12-24 ENCOUNTER — Telehealth: Payer: Self-pay | Admitting: Family Medicine

## 2011-12-24 MED ORDER — TRAMADOL HCL 50 MG PO TABS: 50.0000 mg | ORAL_TABLET | Freq: Three times a day (TID) | ORAL | Status: DC | PRN

## 2011-12-24 NOTE — Telephone Encounter (Signed)
Hannah Vasquez, Hannah Vasquez is calling about Medication: Tramadol 50 mgs 1 PO at bedtime. Prescribed by Dr. Deborra Medina. Pt states she was instructed that she could could take 2 tabs daily and she is out of medication. Issued 60 day supply on 11/09/11 and is asking for a refill. Pharmacy is not able to refill unless another order is faxed. PLEASE ASK DR. ARON IF 2 TABS HAVE BEEN ORDERED DAILY AND CALLBACK [phone],

## 2011-12-24 NOTE — Telephone Encounter (Signed)
Rx changed and sent electronically to [facility].

## 2012-01-19 ENCOUNTER — Other Ambulatory Visit: Payer: Self-pay | Admitting: *Deleted

## 2012-01-19 MED ORDER — HYDROCHLOROTHIAZIDE 25 MG PO TABS: ORAL_TABLET | ORAL | Status: DC

## 2012-04-19 ENCOUNTER — Encounter: Payer: Self-pay | Admitting: Family Medicine

## 2012-04-19 ENCOUNTER — Ambulatory Visit (INDEPENDENT_AMBULATORY_CARE_PROVIDER_SITE_OTHER): Payer: BC Managed Care – PPO | Admitting: Family Medicine

## 2012-04-19 VITALS — BP 124/82 | HR 62 | Temp 98.1°F | Wt 145.0 lb

## 2012-04-19 DIAGNOSIS — R21 Rash and other nonspecific skin eruption: Secondary | ICD-10-CM

## 2012-04-19 MED ORDER — DOXYCYCLINE HYCLATE 100 MG PO CAPS: 100.0000 mg | ORAL_CAPSULE | Freq: Two times a day (BID) | ORAL | Status: AC

## 2012-04-19 NOTE — Progress Notes (Signed)
  Subjective:    Patient ID: Hannah Vasquez, female    DOB: 10-31-1950, 61 y.o.   MRN: [id number]  HPI CC: new rash  Daughter of Rush Farmer.  1 wk h/o rash that started on left face.  Now seems to be spreading throughout body - arms, legs, abdomen, trunk.  Left side of face feels swollen.  Some feeling of fullness left lower cheek, some tenderness.  Feels like pimple coming.  Mildly itchy and "sunburn feeling".  No new medicines, lotions, detergents, soaps, shampoos.  No fevers/chills, abd pain, vomiting, oral lesions.  Mild joint aches and nausea intermittently.  Last night did have feeling of malaise associated with diarrhea and stomach upset.  Doesn't think has been bitten by insects. Has been sleeping at home, same bed. Did sleep on father's couch last week.  Has been in and out of hospital/ER with father.  Has had zostavax.  Has not had shingles in past.  No recent tick bites.  Past Medical History  Diagnosis Date  . Mitral valve prolapse   . Hemorrhoids   . CAD (coronary artery disease)   . Allergy   . Anxiety   . Osteoporosis     Review of Systems Per HPI    Objective:   Physical Exam  Nursing note and vitals reviewed. Constitutional: She appears well-developed and well-nourished. No distress.  Skin: Skin is warm and dry. Rash noted.  no oral lesions. Left face with erythematous macules on left forehead, under left eye and left lower chin (which is hardened).  Rest of body with diffuse slightly pruritic maculopapular rash on legs and feet, arms and abdomen, neck line, some on lower back.  Spares palms/soles.    Assessment & Plan:

## 2012-04-19 NOTE — Assessment & Plan Note (Signed)
Unclear etiology. I think 2 different things going on - left skin erythema concerning for cellulitis so will treat with doxycycline.   Rest of skin rash possible viral exanthem or bug bites/chiggers? Although not significantly pruritic.  Monitor for now.

## 2012-04-19 NOTE — Patient Instructions (Signed)
I think this is 2 different things going on. Some concern for skin infection on left side of face - so start antibiotic (doxycycline twice daily for 10 days.) I wonder if rest of skin rash is due to viral infection which should get better with time, or if it could be due to bug bites - wash all clothing and bedding. Let us know if not improving as expected.

## 2012-04-21 ENCOUNTER — Telehealth: Payer: Self-pay | Admitting: Family Medicine

## 2012-04-21 MED ORDER — VALACYCLOVIR HCL 1 G PO TABS: 1000.0000 mg | ORAL_TABLET | Freq: Three times a day (TID) | ORAL | Status: DC

## 2012-04-21 NOTE — Telephone Encounter (Signed)
Pt presents today with father - worsening rash on face, nonblanching.  ?shingles.  Itchy and burning continued, spreading.  Also with temporal HA. Doxy has not helped - will stop. Body rash improved. Start valtrex.  Discussed this with pt - sent valtrex 1gm tid x7 d to pharmacy. I've asked her to return early next week for recheck.

## 2012-04-28 ENCOUNTER — Telehealth: Payer: Self-pay

## 2012-04-28 NOTE — Telephone Encounter (Signed)
Pt said due to father in rehab she cannot come today for an appt. Advised pt about [facility]; pt would rather be seen early Monday morning. appt given 04/21/12 at 8:45 am with Dr Glori Bickers. Pt understands if condition changes or worsens to call for appt at [facility] or go to [facility] if needed.

## 2012-04-28 NOTE — Telephone Encounter (Signed)
If not better , I recommend she come in for re evaluation.

## 2012-04-28 NOTE — Telephone Encounter (Signed)
Let her know in light of new rash - I feel strongly that she not wait until Monday - and go to sat clinic (urgent care if that is not possible) Please sched with sat clinic if you can

## 2012-04-28 NOTE — Telephone Encounter (Signed)
Finished Valtrex 04/27/12; some places began to dry up but on 04/26/12 new break outs all over body. Places are itching and feel like sunburn. Please advise. [place].

## 2012-04-28 NOTE — Telephone Encounter (Signed)
Patient notified. She wants to wait til Monday

## 2012-05-01 ENCOUNTER — Encounter: Payer: Self-pay | Admitting: Family Medicine

## 2012-05-01 ENCOUNTER — Ambulatory Visit (INDEPENDENT_AMBULATORY_CARE_PROVIDER_SITE_OTHER): Payer: BC Managed Care – PPO | Admitting: Family Medicine

## 2012-05-01 ENCOUNTER — Other Ambulatory Visit: Payer: Self-pay | Admitting: *Deleted

## 2012-05-01 VITALS — BP 154/84 | HR 58 | Temp 97.9°F | Ht 62.0 in | Wt 144.5 lb

## 2012-05-01 DIAGNOSIS — R21 Rash and other nonspecific skin eruption: Secondary | ICD-10-CM

## 2012-05-01 MED ORDER — TRAMADOL HCL 50 MG PO TABS: 50.0000 mg | ORAL_TABLET | Freq: Three times a day (TID) | ORAL | Status: DC | PRN

## 2012-05-01 MED ORDER — PREDNISONE 10 MG PO TABS: ORAL_TABLET | ORAL | Status: DC

## 2012-05-01 NOTE — Progress Notes (Signed)
Subjective:    Patient ID: Hannah Vasquez, female    DOB: 09-28-1950, 61 y.o.   MRN: [id number]  HPI Here for f/u of rash on L face Seen by Dr Feliberto Gottron put on doxycyline for poss cellulitis- then valtrex after it worsened for poss shingles That is getting better Headache is overall improved , not completely gone   She also had another rash all over  ? What is causing it  No new products at all Takes claritin every day- that is not new   Finished px meds - done with valtrex and doxy   Tries not to scratch- but very itchy Has not been out in the sun  No fever  Does not feel sick   In distant past her dermatologist Dr Radford Pax- investigated rash ? Similar- did a bx-? Where those records are     Stress- father has a fracture in back  Also cva in ICU  Thinks stress may be a factor in her rash  Patient Active Problem List  Diagnosis  . ANXIETY STATE, UNSPECIFIED  . CAD  . MITRAL VALVE PROLAPSE  . HEMORRHOIDS  . ALLERGIC RHINITIS  . UTI  . OSTEOPENIA  . HYPERTENSION, BENIGN  . UNSPEC SYMPTOM ASSOC W/FEMALE GENITAL ORGANS  . EYE PAIN, RIGHT  . Routine general medical examination at a health care facility  . Skin rash   Past Medical History  Diagnosis Date  . Mitral valve prolapse   . Hemorrhoids   . CAD (coronary artery disease)   . Allergy   . Anxiety   . Osteoporosis    Past Surgical History  Procedure Date  . Knee surgery 2003    torn meniscus  . Bladder surgery 11/15/1995    bladder neck suspension, urinary incontinence   History  Substance Use Topics  . Smoking status: Never Smoker   . Smokeless tobacco: Not on file  . Alcohol Use: Not on file   Family History  Problem Relation Age of Onset  . Mitral valve prolapse Mother   . Hypertension Father    Allergies  Allergen Reactions  . Codeine     REACTION: nausea and vomiting  . Penicillins     REACTION: rash   Current Outpatient Prescriptions on File Prior to Visit  Medication Sig Dispense  Refill  . albuterol (PROVENTIL HFA;VENTOLIN HFA) 108 (90 BASE) MCG/ACT inhaler Inhale 2 puffs into the lungs every 4 (four) hours as needed.      Marland Kitchen aspirin 81 MG tablet Take 160 mg by mouth daily.      . Calcium Carbonate-Vit D-Min 600-400 MG-UNIT TABS Take 1 tablet by mouth daily.      . diclofenac (VOLTAREN) 50 MG EC tablet Take 50 mg by mouth as needed.       . fluticasone (FLONASE) 50 MCG/ACT nasal spray Place 2 sprays into the nose daily.      . hydrochlorothiazide (HYDRODIURIL) 25 MG tablet Take one half by mouth daily  30 tablet  7  . loratadine (CLARITIN) 10 MG tablet Take 10 mg by mouth daily.      . metoprolol succinate (TOPROL-XL) 100 MG 24 hr tablet Take 1 tablet (100 mg total) by mouth daily.  30 tablet  6  . Multiple Vitamin (MULTIVITAMIN) tablet Take 1 tablet by mouth daily.      . Omega-3 Fatty Acids (FISH OIL) 1200 MG CAPS Take 1 capsule by mouth daily.      . traMADol (ULTRAM) 50 MG tablet Take 1  tablet (50 mg total) by mouth every 8 (eight) hours as needed for pain.  90 tablet  3  . Cholecalciferol (VITAMIN D3) 1000 UNITS CAPS Take 1 capsule by mouth daily.      . valACYclovir (VALTREX) 1000 MG tablet Take 1 tablet (1,000 mg total) by mouth 3 (three) times daily.  21 tablet  0            Review of Systems    Review of Systems  Constitutional: Negative for fever, appetite change, fatigue and unexpected weight change.  Eyes: Negative for pain and visual disturbance.  Respiratory: Negative for cough and shortness of breath.   Cardiovascular: Negative for cp or palpitations    Gastrointestinal: Negative for nausea, diarrhea and constipation.  Genitourinary: Negative for urgency and frequency.  Skin: Negative for pallor and pos for rash and itching  Neurological: Negative for weakness, light-headedness, numbness and pos for headache that is improved  Hematological: Negative for adenopathy. Does not bruise/bleed easily.  Psychiatric/Behavioral: Negative for dysphoric  mood. The patient is not nervous/anxious.      Objective:   Physical Exam  Constitutional: She appears well-developed and well-nourished. No distress.  HENT:  Head: Normocephalic and atraumatic.  Mouth/Throat: Oropharynx is clear and moist.  Eyes: Conjunctivae normal and EOM are normal. Pupils are equal, round, and reactive to light. Right eye exhibits no discharge. Left eye exhibits no discharge. No scleral icterus.  Neck: Normal range of motion. Neck supple.  Cardiovascular: Normal rate and regular rhythm.   Pulmonary/Chest: Effort normal and breath sounds normal. No respiratory distress. She has no wheezes. She has no rales.  Abdominal: Soft. Bowel sounds are normal.  Musculoskeletal: She exhibits no edema and no tenderness.       No acute joint changes   Lymphadenopathy:    She has no cervical adenopathy.  Neurological: She is alert. She has normal reflexes. No cranial nerve deficit.  Skin: Skin is warm and dry. Rash noted. There is erythema. No pallor.       Rash on L face is resolved   Rash on neck and body- is maculopapular with some confluent areas, no vesicles or weeping  Apparent on neck/ chest/ trunk/ proximal arms and legs No excoriation    Psychiatric: She has a normal mood and affect.          Assessment & Plan:

## 2012-05-01 NOTE — Assessment & Plan Note (Signed)
2 rashes The facial rash - which was suspected to be zoster (or infx) is resolved after valtrex  The body rash is worse -maculopapular (perhaps a few whelps?) Disc poss etiol incl allergies or stress Will tx with low dose prednisone taper Disc product selection Also trial of zyrtec instead of claritin Get old records from Dr Radford Pax

## 2012-05-01 NOTE — Patient Instructions (Addendum)
Take the prednisone as directed  No fragrance or color in your products - use dove soap for sensitive skin, no fabric softener , and use detergents with the word "free", and lubriderm for sensitive skin for moisturizer  Update me if not a lot better within a week  You may want to try zyrtec instead of claritin just to see if it works better Keep cool

## 2012-05-01 NOTE — Telephone Encounter (Signed)
Faxed refill request from [facility], last filled 03/17/12.

## 2012-05-15 ENCOUNTER — Ambulatory Visit (INDEPENDENT_AMBULATORY_CARE_PROVIDER_SITE_OTHER): Payer: BC Managed Care – PPO | Admitting: Family Medicine

## 2012-05-15 ENCOUNTER — Encounter: Payer: Self-pay | Admitting: Family Medicine

## 2012-05-15 VITALS — BP 152/72 | HR 56 | Temp 97.8°F | Wt 145.0 lb

## 2012-05-15 DIAGNOSIS — R21 Rash and other nonspecific skin eruption: Secondary | ICD-10-CM

## 2012-05-15 MED ORDER — TRAMADOL HCL 50 MG PO TABS: 50.0000 mg | ORAL_TABLET | Freq: Three times a day (TID) | ORAL | Status: DC | PRN

## 2012-05-15 MED ORDER — FLUOCINONIDE-E 0.05 % EX CREA: TOPICAL_CREAM | Freq: Two times a day (BID) | CUTANEOUS | Status: DC

## 2012-05-15 NOTE — Patient Instructions (Addendum)
Great to see you. I am glad your dad is finally home. Continue Claritin, let's try topical lidex to the rash areas.  Keep me posted.

## 2012-05-15 NOTE — Progress Notes (Signed)
Subjective:    Patient ID: Hannah Vasquez, female    DOB: 08/11/1951, 61 y.o.   MRN: [id number]  HPI Here for f/u of rash and not feeling well.  Saw Dr Darnell Level on 9/4 for rash on L face.  Originally put on doxycyline for poss cellulitis- then valtrex after it worsened for poss shingles That is getting better  She also had another rash all over- saw Dr. Glori Bickers on 9/16 for this rash.  She had already finished both doxy and valtrex by the time she saw Dr. Glori Bickers. Rash was itchy- takes Claritin daily, switched to zyrtec to see if would help.  Did have zostavax when she turned 58.  Prednisone taper helped.  Rash almost completely resolved.  Does have a few areas still, very itching.    Under extreme stress-father has been sick.   Staying with him on and off in ICU- finally home.    Does have a little left sided facial headache.      Patient Active Problem List  Diagnosis  . ANXIETY STATE, UNSPECIFIED  . CAD  . MITRAL VALVE PROLAPSE  . HEMORRHOIDS  . ALLERGIC RHINITIS  . UTI  . OSTEOPENIA  . HYPERTENSION, BENIGN  . UNSPEC SYMPTOM ASSOC W/FEMALE GENITAL ORGANS  . EYE PAIN, RIGHT  . Routine general medical examination at a health care facility  . Skin rash   Past Medical History  Diagnosis Date  . Mitral valve prolapse   . Hemorrhoids   . CAD (coronary artery disease)   . Allergy   . Anxiety   . Osteoporosis    Past Surgical History  Procedure Date  . Knee surgery 2003    torn meniscus  . Bladder surgery 11/15/1995    bladder neck suspension, urinary incontinence   History  Substance Use Topics  . Smoking status: Never Smoker   . Smokeless tobacco: Not on file  . Alcohol Use: Not on file   Family History  Problem Relation Age of Onset  . Mitral valve prolapse Mother   . Hypertension Father    Allergies  Allergen Reactions  . Codeine     REACTION: nausea and vomiting  . Penicillins     REACTION: rash   Current Outpatient Prescriptions on File Prior to Visit    Medication Sig Dispense Refill  . albuterol (PROVENTIL HFA;VENTOLIN HFA) 108 (90 BASE) MCG/ACT inhaler Inhale 2 puffs into the lungs every 4 (four) hours as needed.      Marland Kitchen aspirin 81 MG tablet Take 160 mg by mouth daily.      . Calcium Carbonate-Vit D-Min 600-400 MG-UNIT TABS Take 1 tablet by mouth daily.      . Cholecalciferol (VITAMIN D3) 1000 UNITS CAPS Take 1 capsule by mouth daily.      . diclofenac (VOLTAREN) 50 MG EC tablet Take 50 mg by mouth as needed.       . fluticasone (FLONASE) 50 MCG/ACT nasal spray Place 2 sprays into the nose daily.      . hydrochlorothiazide (HYDRODIURIL) 25 MG tablet Take one half by mouth daily  30 tablet  7  . loratadine (CLARITIN) 10 MG tablet Take 10 mg by mouth daily.      . metoprolol succinate (TOPROL-XL) 100 MG 24 hr tablet Take 1 tablet (100 mg total) by mouth daily.  30 tablet  6  . Multiple Vitamin (MULTIVITAMIN) tablet Take 1 tablet by mouth daily.      . Omega-3 Fatty Acids (FISH OIL) 1200 MG CAPS  Take 1 capsule by mouth daily.      . predniSONE (DELTASONE) 10 MG tablet Take 3 pills once daily by mouth for 3 days, then 2 pills once daily for 3 days, then 1 pill once daily for 3 days then stop  18 tablet  0  . traMADol (ULTRAM) 50 MG tablet Take 1 tablet (50 mg total) by mouth every 8 (eight) hours as needed for pain.  90 tablet  0  . valACYclovir (VALTREX) 1000 MG tablet Take 1 tablet (1,000 mg total) by mouth 3 (three) times daily.  21 tablet  0            Review of Systems    Review of Systems  Constitutional: Negative for fever, appetite change, fatigue and unexpected weight change.  Eyes: Negative for pain and visual disturbance.  Respiratory: Negative for cough and shortness of breath.   Cardiovascular: Negative for cp or palpitations    Gastrointestinal: Negative for nausea, diarrhea and constipation.  Genitourinary: Negative for urgency and frequency.  Skin: Negative for pallor and pos for rash and itching  Neurological:  Negative for weakness, light-headedness, numbness and pos for headache that is improved  Hematological: Negative for adenopathy. Does not bruise/bleed easily.  Psychiatric/Behavioral: Negative for dysphoric mood. The patient is not nervous/anxious.      Objective:   Physical Exam  BP 152/72  Pulse 56  Temp 97.8 F (36.6 C)  Wt 145 lb (65.772 kg)  Constitutional: She appears well-developed and well-nourished. No distress.  HENT:  Head: Normocephalic and atraumatic.  Mouth/Throat: Oropharynx is clear and moist.  Eyes: Conjunctivae normal and EOM are normal. Pupils are equal, round, and reactive to light. Right eye exhibits no discharge. Left eye exhibits no discharge. No scleral icterus.  Neck: Normal range of motion. Neck supple.  Cardiovascular: Normal rate and regular rhythm.   Pulmonary/Chest: Effort normal and breath sounds normal. No respiratory distress. She has no wheezes. She has no rales.  Abdominal: Soft. Bowel sounds are normal.  Musculoskeletal: She exhibits no edema and no tenderness.       No acute joint changes   Lymphadenopathy:    She has no cervical adenopathy.  Neurological: She is alert. She has normal reflexes. No cranial nerve deficit.  Skin: Skin is warm and dry. Rash noted. There is erythema. No pallor.       Rash on L face is resolved  Maculopapaular rash on lower back, no weeping or erythema. Rest of rash resolved.  Psychiatric: She has a normal mood and affect.       Assessment & Plan:   1. Skin rash   New- improving. ?allergic vs stress. Try topical lidex, continue claritin. Will hopefully improve now that her dad is home. She will call me and update me with symptoms. Next step would be referral back to derm. The patient indicates understanding of these issues and agrees with the plan.

## 2012-06-16 ENCOUNTER — Other Ambulatory Visit: Payer: Self-pay

## 2012-06-16 MED ORDER — METOPROLOL SUCCINATE ER 100 MG PO TB24: 100.0000 mg | ORAL_TABLET | Freq: Every day | ORAL | Status: DC

## 2012-06-16 NOTE — Telephone Encounter (Signed)
Pt visiting [facility] for 5 days left Toprol XL at home asked for 5 pills called to [facility] [phone].Medication phoned to pharmacy as instructed.Pt notified by phone.

## 2012-07-18 ENCOUNTER — Other Ambulatory Visit: Payer: Self-pay | Admitting: *Deleted

## 2012-07-18 MED ORDER — TRAMADOL HCL 50 MG PO TABS: 50.0000 mg | ORAL_TABLET | Freq: Three times a day (TID) | ORAL | Status: DC | PRN

## 2012-07-18 MED ORDER — METOPROLOL SUCCINATE ER 100 MG PO TB24: 100.0000 mg | ORAL_TABLET | Freq: Every day | ORAL | Status: DC

## 2012-07-18 NOTE — Addendum Note (Signed)
Addended by: Daralene Milch C on: 07/18/2012 01:03 PM   Modules accepted: Orders

## 2012-07-18 NOTE — Addendum Note (Signed)
Addended by: Marchia Bond on: 07/18/2012 09:59 AM   Modules accepted: Orders

## 2012-08-03 ENCOUNTER — Other Ambulatory Visit: Payer: Self-pay | Admitting: *Deleted

## 2012-08-03 MED ORDER — DICLOFENAC SODIUM 75 MG PO TBEC: 75.0000 mg | DELAYED_RELEASE_TABLET | Freq: Two times a day (BID) | ORAL | Status: DC

## 2012-08-03 NOTE — Telephone Encounter (Signed)
Received refill request for diclofenac 75 mg bid, dosage and instructions were different than the historical dose we had on file of 50 mg qd prn, but pt last had the 75 mg bid dose filled on 06/21/12. Is it ok to refill?

## 2012-10-17 ENCOUNTER — Other Ambulatory Visit: Payer: Self-pay | Admitting: *Deleted

## 2012-10-17 NOTE — Telephone Encounter (Signed)
Faxed refill request from [facility], last filled 07/18/12.

## 2012-10-18 MED ORDER — TRAMADOL HCL 50 MG PO TABS: 50.0000 mg | ORAL_TABLET | Freq: Three times a day (TID) | ORAL | Status: DC | PRN

## 2012-10-18 NOTE — Telephone Encounter (Signed)
Refill phoned in to [facility].

## 2012-10-30 ENCOUNTER — Other Ambulatory Visit: Payer: Self-pay | Admitting: *Deleted

## 2012-10-30 MED ORDER — DICLOFENAC SODIUM 75 MG PO TBEC: 75.0000 mg | DELAYED_RELEASE_TABLET | Freq: Two times a day (BID) | ORAL | Status: DC

## 2012-10-30 NOTE — Telephone Encounter (Signed)
Faxed refill request from [facility], last filled 60 on 06/21/12.

## 2012-11-07 ENCOUNTER — Encounter: Payer: Self-pay | Admitting: Internal Medicine

## 2012-11-07 ENCOUNTER — Encounter: Payer: Self-pay | Admitting: Family Medicine

## 2012-11-07 ENCOUNTER — Ambulatory Visit (INDEPENDENT_AMBULATORY_CARE_PROVIDER_SITE_OTHER): Payer: BC Managed Care – PPO | Admitting: Family Medicine

## 2012-11-07 VITALS — BP 120/80 | HR 60 | Temp 97.6°F | Ht 62.25 in | Wt 147.0 lb

## 2012-11-07 DIAGNOSIS — I2510 Atherosclerotic heart disease of native coronary artery without angina pectoris: Secondary | ICD-10-CM

## 2012-11-07 DIAGNOSIS — Z1211 Encounter for screening for malignant neoplasm of colon: Secondary | ICD-10-CM

## 2012-11-07 DIAGNOSIS — M949 Disorder of cartilage, unspecified: Secondary | ICD-10-CM

## 2012-11-07 DIAGNOSIS — Z136 Encounter for screening for cardiovascular disorders: Secondary | ICD-10-CM

## 2012-11-07 DIAGNOSIS — E559 Vitamin D deficiency, unspecified: Secondary | ICD-10-CM

## 2012-11-07 DIAGNOSIS — F411 Generalized anxiety disorder: Secondary | ICD-10-CM

## 2012-11-07 DIAGNOSIS — Z0000 Encounter for general adult medical examination without abnormal findings: Secondary | ICD-10-CM

## 2012-11-07 DIAGNOSIS — I10 Essential (primary) hypertension: Secondary | ICD-10-CM

## 2012-11-07 LAB — COMPREHENSIVE METABOLIC PANEL
ALT: 23 U/L (ref 0–35)
Alkaline Phosphatase: 112 U/L (ref 39–117)
Glucose, Bld: 110 mg/dL — ABNORMAL HIGH (ref 70–99)
Sodium: 141 mEq/L (ref 135–145)
Total Bilirubin: 0.6 mg/dL (ref 0.3–1.2)
Total Protein: 7.3 g/dL (ref 6.0–8.3)

## 2012-11-07 LAB — CBC WITH DIFFERENTIAL/PLATELET
Basophils Absolute: 0 10*3/uL (ref 0.0–0.1)
Basophils Relative: 0.6 % (ref 0.0–3.0)
Eosinophils Absolute: 0.3 10*3/uL (ref 0.0–0.7)
Lymphocytes Relative: 36.5 % (ref 12.0–46.0)
MCHC: 34.3 g/dL (ref 30.0–36.0)
Neutrophils Relative %: 52 % (ref 43.0–77.0)
RBC: 4.89 Mil/uL (ref 3.87–5.11)

## 2012-11-07 LAB — LIPID PANEL
Cholesterol: 197 mg/dL (ref 0–200)
LDL Cholesterol: 138 mg/dL — ABNORMAL HIGH (ref 0–99)
VLDL: 33 mg/dL (ref 0.0–40.0)

## 2012-11-07 MED ORDER — FLUTICASONE PROPIONATE 50 MCG/ACT NA SUSP: 2.0000 | Freq: Every day | NASAL | Status: DC

## 2012-11-07 MED ORDER — FLUOCINONIDE-E 0.05 % EX CREA: TOPICAL_CREAM | Freq: Two times a day (BID) | CUTANEOUS | Status: DC

## 2012-11-07 MED ORDER — HYDROCHLOROTHIAZIDE 25 MG PO TABS: ORAL_TABLET | ORAL | Status: DC

## 2012-11-07 MED ORDER — METOPROLOL SUCCINATE ER 100 MG PO TB24: 100.0000 mg | ORAL_TABLET | Freq: Every day | ORAL | Status: DC

## 2012-11-07 MED ORDER — TRAMADOL HCL 50 MG PO TABS: 50.0000 mg | ORAL_TABLET | Freq: Three times a day (TID) | ORAL | Status: DC | PRN

## 2012-11-07 MED ORDER — ALBUTEROL SULFATE HFA 108 (90 BASE) MCG/ACT IN AERS: 2.0000 | INHALATION_SPRAY | RESPIRATORY_TRACT | Status: DC | PRN

## 2012-11-07 NOTE — Patient Instructions (Addendum)
Great to see you. We will call you with your lab results.  Please stop by to see Rosaria Ferries on your way out to set up your gastroenterology referral.

## 2012-11-07 NOTE — Progress Notes (Signed)
62 yo pleasant female here for CPX.    Well woman- s/p hysterectomy in 2007 for fibroids. Mammogram due in May.  UTD on vaccinations and had a colonoscopy in 2008.  HTN- on Metoprolol 100 mg daily, HCTZ 12. 5 mg daily.  H NO CP, blurred vision or SOB.  No LE edema.  Osteopenia-  Taking Fosamax, DEXA last year.  Patient Active Problem List  Diagnosis  . ANXIETY STATE, UNSPECIFIED  . CAD  . MITRAL VALVE PROLAPSE  . HEMORRHOIDS  . ALLERGIC RHINITIS  . OSTEOPENIA  . HYPERTENSION, BENIGN  . UNSPEC SYMPTOM ASSOC W/FEMALE GENITAL ORGANS  . EYE PAIN, RIGHT  . Routine general medical examination at a health care facility  . Skin rash   Past Medical History  Diagnosis Date  . Mitral valve prolapse   . Hemorrhoids   . CAD (coronary artery disease)   . Allergy   . Anxiety   . Osteoporosis    Past Surgical History  Procedure Laterality Date  . Knee surgery  2003    torn meniscus  . Bladder surgery  11/15/1995    bladder neck suspension, urinary incontinence   History  Substance Use Topics  . Smoking status: Never Smoker   . Smokeless tobacco: Not on file  . Alcohol Use: Not on file   Family History  Problem Relation Age of Onset  . Mitral valve prolapse Mother   . Hypertension Father    Allergies  Allergen Reactions  . Codeine     REACTION: nausea and vomiting  . Penicillins     REACTION: rash   Current Outpatient Prescriptions on File Prior to Visit  Medication Sig Dispense Refill  . albuterol (PROVENTIL HFA;VENTOLIN HFA) 108 (90 BASE) MCG/ACT inhaler Inhale 2 puffs into the lungs every 4 (four) hours as needed.      Marland Kitchen aspirin 81 MG tablet Take 160 mg by mouth daily.      . Calcium Carbonate-Vit D-Min 600-400 MG-UNIT TABS Take 1 tablet by mouth daily.      . Cholecalciferol (VITAMIN D3) 1000 UNITS CAPS Take 1 capsule by mouth daily.      . diclofenac (VOLTAREN) 75 MG EC tablet Take 1 tablet (75 mg total) by mouth 2 (two) times daily.  60 tablet  0  .  fluocinonide-emollient (LIDEX-E) 0.05 % cream Apply topically 2 (two) times daily.  30 g  0  . fluticasone (FLONASE) 50 MCG/ACT nasal spray Place 2 sprays into the nose daily.      . hydrochlorothiazide (HYDRODIURIL) 25 MG tablet Take one half by mouth daily  30 tablet  7  . loratadine (CLARITIN) 10 MG tablet Take 10 mg by mouth daily.      . metoprolol succinate (TOPROL-XL) 100 MG 24 hr tablet Take 1 tablet (100 mg total) by mouth daily.  30 tablet  6  . Multiple Vitamin (MULTIVITAMIN) tablet Take 1 tablet by mouth daily.      . Omega-3 Fatty Acids (FISH OIL) 1200 MG CAPS Take 1 capsule by mouth daily.      . traMADol (ULTRAM) 50 MG tablet Take 1 tablet (50 mg total) by mouth every 8 (eight) hours as needed for pain.  90 tablet  0  . valACYclovir (VALTREX) 1000 MG tablet Take 1 tablet (1,000 mg total) by mouth 3 (three) times daily.  21 tablet  0   No current facility-administered medications on file prior to visit.   The PMH, PSH, Social History, Family History, Medications, and allergies  have been reviewed in [facility], and have been updated if relevant.  Review of Systems       See HPI Patient reports no  vision/ hearing changes,anorexia, weight change, fever ,adenopathy, persistant / recurrent hoarseness, swallowing issues, chest pain, edema,persistant / recurrent cough, hemoptysis, dyspnea(rest, exertional, paroxysmal nocturnal), gastrointestinal  bleeding (melena, rectal bleeding), abdominal pain, excessive heart burn, GU symptoms(dysuria, hematuria, pyuria, voiding/incontinence  Issues) syncope, focal weakness, severe memory loss, concerning skin lesions, depression, anxiety, abnormal bruising/bleeding, major joint swelling, breast masses or abnormal vaginal bleeding.     Physical Exam BP 120/80  Pulse 60  Temp(Src) 97.6 F (36.4 C)  Ht 5' 2.25" (1.581 m)  Wt 147 lb (66.679 kg)  BMI 26.68 kg/m2 Wt Readings from Last 3 Encounters:  11/07/12 147 lb (66.679 kg)  05/15/12 145 lb (65.772  kg)  05/01/12 144 lb 8 oz (65.545 kg)     General:  alert, well-developed, well-nourished, and well-hydrated.   Head:  normocephalic and atraumatic.   Eyes:  vision grossly intact, pupils equal, pupils round, and pupils reactive to light.   Ears:  R ear normal and L ear normal.   Nose:  nose piercing noted.   Mouth:  good dentition.   Neck:  No deformities, masses, or tenderness noted. Lungs:  Normal respiratory effort, chest expands symmetrically. Lungs are clear to auscultation, no crackles or wheezes. Heart:  normal rate, regular rhythm, and no murmur.   Abdomen:  Bowel sounds positive,abdomen soft and non-tender without masses, organomegaly or hernias noted. Msk:  No deformity or scoliosis noted of thoracic or lumbar spine.   Extremities:  no edema Neurologic:  alert & oriented X3 and gait normal.   Skin:  Intact without suspicious lesions or rashes Psych:  normally interactive and subdued.    Assessment and Plan:  1. HYPERTENSION, BENIGN Stable on current meds.  2. OSTEOPENIA On Fosamax.   Repeat DEXA next year.  3. Routine general medical examination at a health care facility Reviewed preventive care protocols, scheduled due services, and updated immunizations Discussed nutrition, exercise, diet, and healthy lifestyle.  - Comprehensive metabolic panel - CBC with Differential  4. Special screening for malignant neoplasms, colon  - Ambulatory referral to Gastroenterology  5. Screening for ischemic heart disease  - Lipid Panel  6. Unspecified vitamin D deficiency  - Vitamin D 25 hydroxy

## 2012-11-21 ENCOUNTER — Ambulatory Visit (INDEPENDENT_AMBULATORY_CARE_PROVIDER_SITE_OTHER): Payer: BC Managed Care – PPO | Admitting: Family Medicine

## 2012-11-21 VITALS — BP 140/80 | HR 57 | Temp 98.1°F | Wt 146.0 lb

## 2012-11-21 DIAGNOSIS — N390 Urinary tract infection, site not specified: Secondary | ICD-10-CM

## 2012-11-21 DIAGNOSIS — J069 Acute upper respiratory infection, unspecified: Secondary | ICD-10-CM

## 2012-11-21 NOTE — Progress Notes (Signed)
SUBJECTIVE:  Hannah Vasquez is a 62 y.o. female who complains of coryza, congestion, sneezing, sore throat, headache and bilateral sinus pain for 2 days.  Her grand daughter was just diagnosed with strep throat.   She denies a history of fevers, sweats, vomiting and weakness and denies a history of asthma. Patient denies smoke cigarettes.   Patient Active Problem List  Diagnosis  . ANXIETY STATE, UNSPECIFIED  . CAD  . MITRAL VALVE PROLAPSE  . HEMORRHOIDS  . ALLERGIC RHINITIS  . OSTEOPENIA  . HYPERTENSION, BENIGN  . UNSPEC SYMPTOM ASSOC W/FEMALE GENITAL ORGANS  . EYE PAIN, RIGHT   Past Medical History  Diagnosis Date  . Mitral valve prolapse   . Hemorrhoids   . CAD (coronary artery disease)   . Allergy   . Anxiety   . Osteoporosis    Past Surgical History  Procedure Laterality Date  . Knee surgery  2003    torn meniscus  . Bladder surgery  11/15/1995    bladder neck suspension, urinary incontinence   History  Substance Use Topics  . Smoking status: Never Smoker   . Smokeless tobacco: Not on file  . Alcohol Use: Not on file   Family History  Problem Relation Age of Onset  . Mitral valve prolapse Mother   . Hypertension Father    Allergies  Allergen Reactions  . Codeine     REACTION: nausea and vomiting  . Penicillins     REACTION: rash   Current Outpatient Prescriptions on File Prior to Visit  Medication Sig Dispense Refill  . albuterol (PROVENTIL HFA;VENTOLIN HFA) 108 (90 BASE) MCG/ACT inhaler Inhale 2 puffs into the lungs every 4 (four) hours as needed.  1 Inhaler  1  . aspirin 81 MG tablet Take 160 mg by mouth daily.      . Calcium Carbonate-Vit D-Min 600-400 MG-UNIT TABS Take 1 tablet by mouth daily.      . Cholecalciferol (VITAMIN D3) 1000 UNITS CAPS Take 1 capsule by mouth daily.      . diclofenac (VOLTAREN) 75 MG EC tablet Take 1 tablet (75 mg total) by mouth 2 (two) times daily.  60 tablet  0  . fluocinonide-emollient (LIDEX-E) 0.05 % cream Apply topically  2 (two) times daily.  30 g  0  . fluticasone (FLONASE) 50 MCG/ACT nasal spray Place 2 sprays into the nose daily.  16 g  1  . hydrochlorothiazide (HYDRODIURIL) 25 MG tablet Take one half by mouth daily  30 tablet  7  . loratadine (CLARITIN) 10 MG tablet Take 10 mg by mouth daily.      . metoprolol succinate (TOPROL-XL) 100 MG 24 hr tablet Take 1 tablet (100 mg total) by mouth daily.  30 tablet  6  . Multiple Vitamin (MULTIVITAMIN) tablet Take 1 tablet by mouth daily.      . Omega-3 Fatty Acids (FISH OIL) 1200 MG CAPS Take 1 capsule by mouth daily.      . traMADol (ULTRAM) 50 MG tablet Take 1 tablet (50 mg total) by mouth every 8 (eight) hours as needed for pain.  90 tablet  0  . valACYclovir (VALTREX) 1000 MG tablet Take 1 tablet (1,000 mg total) by mouth 3 (three) times daily.  21 tablet  0   No current facility-administered medications on file prior to visit.   The PMH, PSH, Social History, Family History, Medications, and allergies have been reviewed in [facility], and have been updated if relevant.  OBJECTIVE: BP 140/80  Pulse 57  Temp(Src) 98.1 F (36.7 C)  Wt 146 lb (66.225 kg)  BMI 26.49 kg/m2  SpO2 98%  She appears well, vital signs are as noted. Ears normal.  Throat and pharynx normal.  Neck supple. No adenopathy in the neck. Nose is congested. Sinuses non tender. The chest is clear, without wheezes or rales, + wet cough.  ASSESSMENT:  viral upper respiratory illness  PLAN: Rapid strep neg and symptoms not consistent. Symptomatic therapy suggested: push fluids, rest and return office visit prn if symptoms persist or worsen. Lack of antibiotic effectiveness discussed with her. Call or return to clinic prn if these symptoms worsen or fail to improve as anticipated.

## 2012-11-21 NOTE — Addendum Note (Signed)
Addended by: Ricki Miller on: 11/21/2012 10:25 AM   Modules accepted: Orders

## 2012-11-21 NOTE — Patient Instructions (Addendum)
   Treat sympotmatically with Mucinex, nasal saline irrigation, and Tylenol/Ibuprofen for next 3 or 4 days.  Continue claritin and flonase as directed.     Call if not improving as expected in 5-7 days.

## 2012-11-21 NOTE — Addendum Note (Signed)
Addended by: Ricki Miller on: 11/21/2012 10:14 AM   Modules accepted: Orders

## 2012-11-23 ENCOUNTER — Telehealth: Payer: Self-pay

## 2012-11-23 LAB — CULTURE, GROUP A STREP: Organism ID, Bacteria: NORMAL

## 2012-11-23 MED ORDER — AZITHROMYCIN 250 MG PO TABS: ORAL_TABLET | ORAL | Status: DC

## 2012-11-23 NOTE — Telephone Encounter (Signed)
Sorry to hear that.  Rx for zpack sent to her pharmacy.  Please call us with an update.

## 2012-11-23 NOTE — Telephone Encounter (Signed)
Advised patient

## 2012-11-23 NOTE — Telephone Encounter (Signed)
Pt seen 11/21/12; pt said feeling worse, productive cough with brown phlegm; hurts both sides of chest when coughs and hard to breathe because coughing so much. Pt does not have a thermometer; pt said skin feels warm - hot and pt having chills. S/T is very sore and swollen; difficult to swallow food.pt has dizziness and continues with sinus pain. Pt taking mucinex,claritin and using flonase and nasal saline irrigation. Pt request antibiotic sent to [facility].Please advise.

## 2012-11-27 ENCOUNTER — Telehealth: Payer: Self-pay | Admitting: Family Medicine

## 2012-11-27 ENCOUNTER — Encounter: Payer: Self-pay | Admitting: Family Medicine

## 2012-11-27 ENCOUNTER — Ambulatory Visit (INDEPENDENT_AMBULATORY_CARE_PROVIDER_SITE_OTHER): Payer: BC Managed Care – PPO | Admitting: Family Medicine

## 2012-11-27 VITALS — BP 120/80 | HR 62 | Temp 97.8°F | Wt 143.0 lb

## 2012-11-27 DIAGNOSIS — J069 Acute upper respiratory infection, unspecified: Secondary | ICD-10-CM

## 2012-11-27 MED ORDER — PREDNISONE 20 MG PO TABS: ORAL_TABLET | ORAL | Status: DC

## 2012-11-27 MED ORDER — LEVOFLOXACIN 500 MG PO TABS: 500.0000 mg | ORAL_TABLET | Freq: Every day | ORAL | Status: DC

## 2012-11-27 NOTE — Telephone Encounter (Signed)
Patient has appt today with Dr. Deborra Medina.

## 2012-11-27 NOTE — Telephone Encounter (Signed)
Patient Information:  Caller Name: Hannah Vasquez  Phone: [phone]  Patient: Hannah Vasquez  Gender: Female  DOB: 13-Mar-1951  Age: 62 Years  PCP: Arnette Norris [facility])  Office Follow Up:  Does the office need to follow up with this patient?: No  Instructions For The Office: N/A  RN Note:  Cough began after returned for [employer].  Grandaughter has strep throat.  Albuterol helps wheezing for 4-6 hours. Using Albuterol BID because she is "afraid of it," does not want to "over medicate."  Chest feels tight with mild shortnesss of breath. Weakness present with brief dizziness when stands. No thermometer; certian she has intermittent fevers.  Advised to begin using Albuterol MDI 2 puffs by inhalation every 4-6 hours prn wheezing, chest tightness or hard coughing.  Symptoms  Reason For Call & Symptoms: Continued cough, worse at night and wheezing. Completed Zpack 11/27/12.  Sore throat recuring.  Reviewed Health History In EMR: Yes  Reviewed Medications In EMR: Yes  Reviewed Allergies In EMR: Yes  Reviewed Surgeries / Procedures: Yes  Date of Onset of Symptoms: 11/18/2012  Treatments Tried: Zpack, Nasal saline, Albuterol MDI  Treatments Tried Worked: No  Any Fever: Yes  Fever Taken: Tactile  Fever Time Of Reading: 06:00:00  Fever Last Reading: N/A  Guideline(s) Used:  Asthma Attack  Disposition Per Guideline:   See Today in Office  Reason For Disposition Reached:   Fever > 100.5 F (38.1 C) and over 15 years of age  Advice Given:  Quick-Relief Asthma Medicine:   Start your quick-relief medicine (e.g., albuterol, salbutamol) at the first sign of any coughing or shortness of breath (don't wait for wheezing). Use your inhaler (2 puffs each time) or nebulizer every 4 hours. Continue the quick-relief medicine until you have not wheezed or coughed for 48 hours.  The best "cough medicine" for an adult with asthma is always the asthma medicine (Note: Don't use cough suppressants, but  cough drops may help a tickly cough).  Call Back If:  You become worse.  Patient Will Follow Care Advice:  YES  Appointment Scheduled:  11/27/2012 12:15:00 Appointment Scheduled Provider:  Arnette Norris [facility])

## 2012-11-27 NOTE — Progress Notes (Signed)
SUBJECTIVE:  Hannah Vasquez is a 62 y.o. female here for follow up/not getting better.  I initially saw her on 4/8 for who complains of coryza, congestion, sneezing, sore throat, headache and bilateral sinus pain for 2 days.  Her grand daughter was just diagnosed with strep throat.  Rapid strep was neg.  Advised supportive care for viral respiratory illness.  She called back two days later with fever and worsening sore throat.  I sent rx for zpack which she finished yesterday.  She has been wheezing all weekend and feels very tired.  Cough is non productive.  No increased WOB. She is using her rescue inhaler as needed.  Patient Active Problem List  Diagnosis  . ANXIETY STATE, UNSPECIFIED  . CAD  . MITRAL VALVE PROLAPSE  . HEMORRHOIDS  . ALLERGIC RHINITIS  . OSTEOPENIA  . HYPERTENSION, BENIGN  . UNSPEC SYMPTOM ASSOC W/FEMALE GENITAL ORGANS  . EYE PAIN, RIGHT   Past Medical History  Diagnosis Date  . Mitral valve prolapse   . Hemorrhoids   . CAD (coronary artery disease)   . Allergy   . Anxiety   . Osteoporosis    Past Surgical History  Procedure Laterality Date  . Knee surgery  2003    torn meniscus  . Bladder surgery  11/15/1995    bladder neck suspension, urinary incontinence   History  Substance Use Topics  . Smoking status: Never Smoker   . Smokeless tobacco: Not on file  . Alcohol Use: Not on file   Family History  Problem Relation Age of Onset  . Mitral valve prolapse Mother   . Hypertension Father    Allergies  Allergen Reactions  . Codeine     REACTION: nausea and vomiting  . Penicillins     REACTION: rash   Current Outpatient Prescriptions on File Prior to Visit  Medication Sig Dispense Refill  . albuterol (PROVENTIL HFA;VENTOLIN HFA) 108 (90 BASE) MCG/ACT inhaler Inhale 2 puffs into the lungs every 4 (four) hours as needed.  1 Inhaler  1  . aspirin 81 MG tablet Take 160 mg by mouth daily.      Marland Kitchen azithromycin (ZITHROMAX) 250 MG tablet 2 tabs by  mouth on day 1 followed by 1 tab by mouth daily days 2-5  6 tablet  0  . Calcium Carbonate-Vit D-Min 600-400 MG-UNIT TABS Take 1 tablet by mouth daily.      . Cholecalciferol (VITAMIN D3) 1000 UNITS CAPS Take 1 capsule by mouth daily.      . diclofenac (VOLTAREN) 75 MG EC tablet Take 1 tablet (75 mg total) by mouth 2 (two) times daily.  60 tablet  0  . fluocinonide-emollient (LIDEX-E) 0.05 % cream Apply topically 2 (two) times daily.  30 g  0  . fluticasone (FLONASE) 50 MCG/ACT nasal spray Place 2 sprays into the nose daily.  16 g  1  . hydrochlorothiazide (HYDRODIURIL) 25 MG tablet Take one half by mouth daily  30 tablet  7  . loratadine (CLARITIN) 10 MG tablet Take 10 mg by mouth daily.      . metoprolol succinate (TOPROL-XL) 100 MG 24 hr tablet Take 1 tablet (100 mg total) by mouth daily.  30 tablet  6  . Multiple Vitamin (MULTIVITAMIN) tablet Take 1 tablet by mouth daily.      . Omega-3 Fatty Acids (FISH OIL) 1200 MG CAPS Take 1 capsule by mouth daily.      . traMADol (ULTRAM) 50 MG tablet Take 1 tablet (  50 mg total) by mouth every 8 (eight) hours as needed for pain.  90 tablet  0  . valACYclovir (VALTREX) 1000 MG tablet Take 1 tablet (1,000 mg total) by mouth 3 (three) times daily.  21 tablet  0   No current facility-administered medications on file prior to visit.   The PMH, PSH, Social History, Family History, Medications, and allergies have been reviewed in [facility], and have been updated if relevant.  OBJECTIVE: BP 120/80  Pulse 62  Temp(Src) 97.8 F (36.6 C)  Wt 143 lb (64.864 kg)  BMI 25.95 kg/m2  SpO2 97%  She appears well, vital signs are as noted. Ears normal.  Throat and pharynx normal.  Neck supple. No adenopathy in the neck. Nose is congested. Sinuses non tender. The chest is clear, without wheezes or rales, + wet cough.  ASSESSMENT/PLAN:  1.  URI  Lungs actually clear on exam.  She used her inhaler prior to coming in today. Oxygen saturation looks good and reassuring.   Will treat with course of prednisone for inflammation, Avelox. The patient indicates understanding of these issues and agrees with the plan.

## 2012-11-27 NOTE — Patient Instructions (Addendum)
Good to see you. We are starting Prednisone - take as directed. Take Levaquin 500 mg daily x 7 days.  Continue albuterol as needed.  Call me in the next day or two with an update.

## 2012-12-01 ENCOUNTER — Emergency Department (HOSPITAL_COMMUNITY): Payer: BC Managed Care – PPO

## 2012-12-01 ENCOUNTER — Emergency Department (HOSPITAL_COMMUNITY)
Admission: EM | Admit: 2012-12-01 | Discharge: 2012-12-01 | Disposition: A | Discharge status: Home / Self Care | Payer: BC Managed Care – PPO | Attending: Emergency Medicine | Admitting: Emergency Medicine

## 2012-12-01 ENCOUNTER — Encounter (HOSPITAL_COMMUNITY): Payer: Self-pay | Admitting: Emergency Medicine

## 2012-12-01 ENCOUNTER — Telehealth: Payer: Self-pay | Admitting: Family Medicine

## 2012-12-01 DIAGNOSIS — Z79899 Other long term (current) drug therapy: Secondary | ICD-10-CM | POA: Insufficient documentation

## 2012-12-01 DIAGNOSIS — Z7982 Long term (current) use of aspirin: Secondary | ICD-10-CM | POA: Insufficient documentation

## 2012-12-01 DIAGNOSIS — Z792 Long term (current) use of antibiotics: Secondary | ICD-10-CM | POA: Insufficient documentation

## 2012-12-01 DIAGNOSIS — F411 Generalized anxiety disorder: Secondary | ICD-10-CM | POA: Insufficient documentation

## 2012-12-01 DIAGNOSIS — R0609 Other forms of dyspnea: Secondary | ICD-10-CM | POA: Insufficient documentation

## 2012-12-01 DIAGNOSIS — Z8679 Personal history of other diseases of the circulatory system: Secondary | ICD-10-CM | POA: Insufficient documentation

## 2012-12-01 DIAGNOSIS — I2510 Atherosclerotic heart disease of native coronary artery without angina pectoris: Secondary | ICD-10-CM | POA: Insufficient documentation

## 2012-12-01 DIAGNOSIS — IMO0002 Reserved for concepts with insufficient information to code with codable children: Secondary | ICD-10-CM | POA: Insufficient documentation

## 2012-12-01 DIAGNOSIS — R0600 Dyspnea, unspecified: Secondary | ICD-10-CM

## 2012-12-01 DIAGNOSIS — I10 Essential (primary) hypertension: Secondary | ICD-10-CM | POA: Insufficient documentation

## 2012-12-01 DIAGNOSIS — R0989 Other specified symptoms and signs involving the circulatory and respiratory systems: Secondary | ICD-10-CM | POA: Insufficient documentation

## 2012-12-01 DIAGNOSIS — M810 Age-related osteoporosis without current pathological fracture: Secondary | ICD-10-CM | POA: Insufficient documentation

## 2012-12-01 HISTORY — DX: Essential (primary) hypertension: I10

## 2012-12-01 LAB — BASIC METABOLIC PANEL
BUN: 21 mg/dL (ref 6–23)
GFR calc non Af Amer: 74 mL/min — ABNORMAL LOW (ref >90)
Glucose, Bld: 178 mg/dL — ABNORMAL HIGH (ref 70–99)
Potassium: 3.2 mEq/L — ABNORMAL LOW (ref 3.5–5.1)

## 2012-12-01 LAB — CBC WITH DIFFERENTIAL/PLATELET
Eosinophils Absolute: 0 10*3/uL (ref 0.0–0.7)
Hemoglobin: 13.2 g/dL (ref 12.0–15.0)
Lymphs Abs: 2.2 10*3/uL (ref 0.7–4.0)
MCH: 28.3 pg (ref 26.0–34.0)
MCV: 83.1 fL (ref 78.0–100.0)
Monocytes Relative: 2 % — ABNORMAL LOW (ref 3–12)
Neutrophils Relative %: 77 % (ref 43–77)
RBC: 4.67 MIL/uL (ref 3.87–5.11)

## 2012-12-01 MED ORDER — POTASSIUM CHLORIDE CRYS ER 20 MEQ PO TBCR
40.0000 meq | EXTENDED_RELEASE_TABLET | Freq: Once | ORAL | Status: AC
  Administered 2012-12-01: 40 meq via ORAL
  Filled 2012-12-01: qty 2

## 2012-12-01 MED ORDER — IOHEXOL 350 MG/ML SOLN
100.0000 mL | Freq: Once | INTRAVENOUS | Status: AC | PRN
  Administered 2012-12-01: 100 mL via INTRAVENOUS

## 2012-12-01 MED ORDER — SODIUM CHLORIDE 0.9 % IV SOLN
INTRAVENOUS | Status: DC
  Administered 2012-12-01: 14:00:00 via INTRAVENOUS

## 2012-12-01 NOTE — ED Provider Notes (Signed)
History     CSN: [id number]  Arrival date & time 12/01/12  1339   First MD Initiated Contact with Patient 12/01/12 1430      Chief Complaint  Patient presents with  . Shortness of Breath    (Consider location/radiation/quality/duration/timing/severity/associated sxs/prior treatment) Patient is a 62 y.o. female presenting with shortness of breath. The history is provided by the patient.  Shortness of Breath  patient here complaining of shortness of breath and weakness x5 days. Does note increased cough and congestion. Saw her Dr. recently and was placed on Levaquin and symptoms have not improved. Does note chest tightness but only with coughing. Denies any anginal type pain. No vomiting or diarrhea. No leg pain or swelling. Symptoms are worse with exertion and better with rest. Denies any peripheral edema. Denies any rashes at this time. No headache or photophobia or neck pain.  Past Medical History  Diagnosis Date  . Mitral valve prolapse   . Hemorrhoids   . CAD (coronary artery disease)   . Allergy   . Anxiety   . Osteoporosis   . Hypertension     Past Surgical History  Procedure Laterality Date  . Knee surgery  2003    torn meniscus  . Bladder surgery  11/15/1995    bladder neck suspension, urinary incontinence    Family History  Problem Relation Age of Onset  . Mitral valve prolapse Mother   . Hypertension Father     History  Substance Use Topics  . Smoking status: Never Smoker   . Smokeless tobacco: Not on file  . Alcohol Use: Not on file    OB History   Grav Para Term Preterm Abortions TAB SAB Ect Mult Living                  Review of Systems  Respiratory: Positive for shortness of breath.   All other systems reviewed and are negative.    Allergies  Codeine and Penicillins  Home Medications   Current Outpatient Rx  Name  Route  Sig  Dispense  Refill  . albuterol (PROVENTIL HFA;VENTOLIN HFA) 108 (90 BASE) MCG/ACT inhaler   Inhalation  Inhale 2 puffs into the lungs every 4 (four) hours as needed.   1 Inhaler   1   . aspirin EC 81 MG tablet   Oral   Take 81 mg by mouth every morning.          . Calcium Carbonate-Vit D-Min 600-400 MG-UNIT TABS   Oral   Take 1 tablet by mouth every morning.          . Cholecalciferol (VITAMIN D3) 1000 UNITS CAPS   Oral   Take 1 capsule by mouth every morning.          . diclofenac (VOLTAREN) 75 MG EC tablet   Oral   Take 1 tablet (75 mg total) by mouth 2 (two) times daily.   60 tablet   0   . fluocinonide-emollient (LIDEX-E) 0.05 % cream   Topical   Apply 1 application topically daily as needed (to legs for rash).         . fluticasone (FLONASE) 50 MCG/ACT nasal spray   Nasal   Place 2 sprays into the nose daily.   16 g   1   . hydrochlorothiazide (HYDRODIURIL) 25 MG tablet   Oral   Take 12.5 mg by mouth every morning. Take one half by mouth daily         . levofloxacin (  LEVAQUIN) 500 MG tablet   Oral   Take 500 mg by mouth every morning.         . loratadine (CLARITIN) 10 MG tablet   Oral   Take 10 mg by mouth every morning.          . metoprolol succinate (TOPROL-XL) 100 MG 24 hr tablet   Oral   Take 100 mg by mouth every morning.         . Multiple Vitamin (MULTIVITAMIN) tablet   Oral   Take 1 tablet by mouth every morning.          . Omega-3 Fatty Acids (FISH OIL) 1200 MG CAPS   Oral   Take 1 capsule by mouth every morning.          . predniSONE (DELTASONE) 20 MG tablet   Oral   Take 10-40 mg by mouth daily. 2 tablets by mouth daily x 4 days, then 1 tablet by mouth daily x 3 days, then 1/2 tablet by mouth daily x 2 days and stop.         . traMADol (ULTRAM) 50 MG tablet   Oral   Take 1 tablet (50 mg total) by mouth every 8 (eight) hours as needed for pain.   90 tablet   0     BP 164/82  Pulse 62  Temp(Src) 99 F (37.2 C) (Oral)  SpO2 100%  Physical Exam  Nursing note and vitals reviewed. Constitutional: She is  oriented to person, place, and time. She appears well-developed and well-nourished.  Non-toxic appearance. No distress.  HENT:  Head: Normocephalic and atraumatic.  Eyes: Conjunctivae, EOM and lids are normal. Pupils are equal, round, and reactive to light.  Neck: Normal range of motion. Neck supple. No tracheal deviation present. No mass present.  Cardiovascular: Normal rate, regular rhythm and normal heart sounds.  Exam reveals no gallop.   No murmur heard. Pulmonary/Chest: Effort normal and breath sounds normal. No stridor. No respiratory distress. She has no decreased breath sounds. She has no wheezes. She has no rhonchi. She has no rales.  Abdominal: Soft. Normal appearance and bowel sounds are normal. She exhibits no distension. There is no tenderness. There is no rebound and no CVA tenderness.  Musculoskeletal: Normal range of motion. She exhibits no edema and no tenderness.  Neurological: She is alert and oriented to person, place, and time. She has normal strength. No cranial nerve deficit or sensory deficit. GCS eye subscore is 4. GCS verbal subscore is 5. GCS motor subscore is 6.  Skin: Skin is warm and dry. No abrasion and no rash noted.  Psychiatric: She has a normal mood and affect. Her speech is normal and behavior is normal.    ED Course  Procedures (including critical care time)  Labs Reviewed  CBC WITH DIFFERENTIAL  BASIC METABOLIC PANEL   Dg Chest 2 View  12/01/2012  *RADIOLOGY REPORT*  Clinical Data: Chest pain, cough  CHEST - 2 VIEW  Comparison: 01/30/2010  Findings: Cardiomediastinal silhouette is stable.  No acute infiltrate or pleural effusion.  No pulmonary edema.  Bony thorax is unremarkable.  IMPRESSION: No active disease.  No significant change.   Original Report Authenticated By: Lahoma Crocker, M.D.      No diagnosis found.    MDM   Date: 12/01/2012  Rate: 59  Rhythm: normal sinus rhythm  QRS Axis: normal  Intervals: normal  ST/T Wave abnormalities:  normal  Conduction Disutrbances:right bundle branch block  Narrative Interpretation:  Old EKG Reviewed: unchanged  3:58 PM Negative for PE or infiltrate. Patient has medications already for bronchitis and will continue those.Doubt that Patient has ACS.        Leota Jacobsen, MD 12/01/12 [phone]

## 2012-12-01 NOTE — ED Notes (Signed)
Patient transported to CT 

## 2012-12-01 NOTE — ED Notes (Signed)
Pt complains of shortness of breath and weakness and is currently being treated for pneumonia. Pt is currently taking antibiotics and steroids and states "I just feel like it is worse"

## 2012-12-01 NOTE — Telephone Encounter (Signed)
Patient Information:  Caller Name: Hannah Vasquez  Phone: [phone]  Patient: Hannah Vasquez  Gender: Female  DOB: 05/05/1951  Age: 62 Years  PCP: Arnette Norris [facility])  Office Follow Up:  Does the office need to follow up with this patient?: No  Instructions For The Office: N/A  RN Note:  Pt was diagnosed w/ URI on 4-14, MD thought Pt may have pneumonia.  Pt has finished Zpack and has 2 days of Levaquin remaining.  Pt is not feeling better.  States she has Chest tightness, fatique, headache and lightheadedness.  Cough is productive, yellow to brown sputum.  Pt has constant Chest tightness, ED dispo. No same appts on 4-18, discussed ED dispo, office agreed w/ dispo.  Pt will go to [facility].  Symptoms  Reason For Call & Symptoms: Pneumonia F/U  Reviewed Health History In EMR: Yes  Reviewed Medications In EMR: Yes  Reviewed Allergies In EMR: Yes  Reviewed Surgeries / Procedures: Yes  Date of Onset of Symptoms: 11/27/2012  Treatments Tried: Zpack and Levaquin, Mucinex  Treatments Tried Worked: No  Any Fever: Yes  Fever Taken: Tactile  Fever Time Of Reading: 12:06:29  Fever Last Reading: N/A  Guideline(s) Used:  Cough  Disposition Per Guideline:   Go to ED Now  Reason For Disposition Reached:   Chest pain present when not coughing  Advice Given:  N/A  Patient Will Follow Care Advice:  YES

## 2012-12-01 NOTE — ED Notes (Signed)
Patient transported to X-ray 

## 2012-12-04 ENCOUNTER — Encounter: Payer: Self-pay | Admitting: *Deleted

## 2012-12-06 ENCOUNTER — Other Ambulatory Visit: Payer: Self-pay | Admitting: *Deleted

## 2012-12-06 MED ORDER — DICLOFENAC SODIUM 75 MG PO TBEC: 75.0000 mg | DELAYED_RELEASE_TABLET | Freq: Two times a day (BID) | ORAL | Status: DC

## 2012-12-06 NOTE — Telephone Encounter (Signed)
Last filled 10/30/12

## 2012-12-07 ENCOUNTER — Telehealth: Payer: Self-pay

## 2012-12-07 NOTE — Telephone Encounter (Signed)
Pt left v/m; pt has completed antibiotic and prednisone. Pt has been treated for pneumonia; pt still tired and slight cough; pt request call back with further instructions or does pt need more medication.Please advise. [facility].

## 2012-12-07 NOTE — Telephone Encounter (Signed)
Advised patient as instructed.

## 2012-12-07 NOTE — Telephone Encounter (Signed)
Cough and fatigue can linger. i would not change anything for now as long as she is afebrile and symptoms are improving. Keep [place] posted.

## 2013-01-01 ENCOUNTER — Other Ambulatory Visit: Payer: Self-pay | Admitting: *Deleted

## 2013-01-01 MED ORDER — DICLOFENAC SODIUM 75 MG PO TBEC: 75.0000 mg | DELAYED_RELEASE_TABLET | Freq: Two times a day (BID) | ORAL | Status: DC

## 2013-01-01 MED ORDER — TRAMADOL HCL 50 MG PO TABS: 50.0000 mg | ORAL_TABLET | Freq: Three times a day (TID) | ORAL | Status: DC | PRN

## 2013-01-01 NOTE — Telephone Encounter (Signed)
Faxed refill requests from [facility] for voltaren, last filled 4/23, and tramadol, last filled 11/19/14.

## 2013-01-19 ENCOUNTER — Encounter: Payer: BC Managed Care – PPO | Admitting: Internal Medicine

## 2013-02-01 ENCOUNTER — Other Ambulatory Visit: Payer: Self-pay | Admitting: Family Medicine

## 2013-02-01 NOTE — Telephone Encounter (Signed)
Electronic refill request. Please advise.

## 2013-02-05 ENCOUNTER — Ambulatory Visit (INDEPENDENT_AMBULATORY_CARE_PROVIDER_SITE_OTHER): Payer: BC Managed Care – PPO | Admitting: Family Medicine

## 2013-02-05 ENCOUNTER — Ambulatory Visit (INDEPENDENT_AMBULATORY_CARE_PROVIDER_SITE_OTHER)
Admission: RE | Admit: 2013-02-05 | Discharge: 2013-02-05 | Disposition: A | Discharge status: Home / Self Care | Payer: BC Managed Care – PPO | Source: Ambulatory Visit | Attending: Family Medicine | Admitting: Family Medicine

## 2013-02-05 ENCOUNTER — Other Ambulatory Visit: Payer: Self-pay | Admitting: Family Medicine

## 2013-02-05 ENCOUNTER — Encounter: Payer: Self-pay | Admitting: Family Medicine

## 2013-02-05 VITALS — BP 138/80 | HR 68 | Temp 98.1°F | Wt 149.0 lb

## 2013-02-05 DIAGNOSIS — M5416 Radiculopathy, lumbar region: Secondary | ICD-10-CM

## 2013-02-05 DIAGNOSIS — M545 Low back pain: Secondary | ICD-10-CM

## 2013-02-05 DIAGNOSIS — IMO0002 Reserved for concepts with insufficient information to code with codable children: Secondary | ICD-10-CM

## 2013-02-05 MED ORDER — PREDNISONE 20 MG PO TABS: ORAL_TABLET | ORAL | Status: DC

## 2013-02-05 NOTE — Progress Notes (Signed)
SUBJECTIVE:  Hannah Vasquez is a 62 y.o. female who complains of low back pain for 1 month(s), positional with bending or lifting, with radiation down the legs. Precipitating factors: none recalled by the patient. Prior history of back problems: recurrent self limited episodes of low back pain in the past. There is no numbness in the legs.  Patient Active Problem List   Diagnosis Date Noted  . EYE PAIN, RIGHT 09/22/2010  . HYPERTENSION, BENIGN 08/27/2010  . UNSPEC SYMPTOM ASSOC W/FEMALE GENITAL ORGANS 08/27/2010  . ANXIETY STATE, UNSPECIFIED 06/27/2009  . CAD 06/27/2009  . MITRAL VALVE PROLAPSE 06/27/2009  . HEMORRHOIDS 06/27/2009  . ALLERGIC RHINITIS 06/27/2009  . OSTEOPENIA 11/11/2006   Past Medical History  Diagnosis Date  . Mitral valve prolapse   . Hemorrhoids   . CAD (coronary artery disease)   . Allergy   . Anxiety   . Osteoporosis   . Hypertension    Past Surgical History  Procedure Laterality Date  . Knee surgery  2003    torn meniscus  . Bladder surgery  11/15/1995    bladder neck suspension, urinary incontinence   History  Substance Use Topics  . Smoking status: Never Smoker   . Smokeless tobacco: Not on file  . Alcohol Use: Not on file   Family History  Problem Relation Age of Onset  . Mitral valve prolapse Mother   . Hypertension Father    Allergies  Allergen Reactions  . Codeine     REACTION: nausea and vomiting  . Penicillins     REACTION: rash   Current Outpatient Prescriptions on File Prior to Visit  Medication Sig Dispense Refill  . albuterol (PROVENTIL HFA;VENTOLIN HFA) 108 (90 BASE) MCG/ACT inhaler Inhale 2 puffs into the lungs every 4 (four) hours as needed.  1 Inhaler  1  . aspirin EC 81 MG tablet Take 81 mg by mouth every morning.       . Calcium Carbonate-Vit D-Min 600-400 MG-UNIT TABS Take 1 tablet by mouth every morning.       . Cholecalciferol (VITAMIN D3) 1000 UNITS CAPS Take 1 capsule by mouth every morning.       . diclofenac  (VOLTAREN) 75 MG EC tablet TAKE ONE (1) TABLET BY MOUTH TWO (2) TIMES DAILY  60 tablet  0  . fluocinonide-emollient (LIDEX-E) 0.05 % cream Apply 1 application topically daily as needed (to legs for rash).      . fluticasone (FLONASE) 50 MCG/ACT nasal spray Place 2 sprays into the nose daily.  16 g  1  . hydrochlorothiazide (HYDRODIURIL) 25 MG tablet Take 12.5 mg by mouth every morning. Take one half by mouth daily      . loratadine (CLARITIN) 10 MG tablet Take 10 mg by mouth every morning.       . metoprolol succinate (TOPROL-XL) 100 MG 24 hr tablet Take 100 mg by mouth every morning.      . Multiple Vitamin (MULTIVITAMIN) tablet Take 1 tablet by mouth every morning.       . Omega-3 Fatty Acids (FISH OIL) 1200 MG CAPS Take 1 capsule by mouth every morning.       . traMADol (ULTRAM) 50 MG tablet TAKE 1 TABLET EVERY 8 HOURS AS NEEDED FOR PAIN  90 tablet  0   No current facility-administered medications on file prior to visit.   The PMH, PSH, Social History, Family History, Medications, and allergies have been reviewed in [facility], and have been updated if relevant.  OBJECTIVE: BP  138/80  Pulse 68  Temp(Src) 98.1 F (36.7 C)  Wt 149 lb (67.586 kg)  BMI 27.04 kg/m2  Patient appears to be in mild to moderate pain, antalgic gait noted. Lumbosacral spine area reveals no local tenderness or mass.  Painful and reduced LS ROM noted. Straight leg raise is positive at 90 degrees on left. DTR's, motor strength and sensation normal, including heel and toe gait.  Peripheral pulses are palpable. X-Ray: ordered, but results not yet available.  ASSESSMENT:  herniated disc likely at L5-S1 and with radiculopathy  PLAN: For acute pain, rest, intermittent application of heat (do not sleep on heating pad), analgesics and muscle relaxants are recommended. Discussed longer term treatment plan of prn NSAID's and discussed a home back care exercise program with flexion exercise routine. Proper lifting with avoidance of  heavy lifting discussed.  Xray today given duration of symptoms along with prednisone dose pack. Likely will need MRI. The patient indicates understanding of these issues and agrees with the plan.

## 2013-02-05 NOTE — Patient Instructions (Addendum)
Good to see you. Please take Prednisone as directed. We will call you with your xray results.

## 2013-02-06 ENCOUNTER — Telehealth: Payer: Self-pay | Admitting: Family Medicine

## 2013-02-06 MED ORDER — ALPRAZOLAM 0.25 MG PO TABS: ORAL_TABLET | ORAL | Status: DC

## 2013-02-06 NOTE — Telephone Encounter (Signed)
Alprazolam called to [facility], left message on voice mail advising patient that script has been called to her pharmacy.

## 2013-02-06 NOTE — Telephone Encounter (Signed)
Patient is claustrophobic and needs meds to have the MRI, please call her in something for nerves to her pharmacy.

## 2013-02-06 NOTE — Telephone Encounter (Signed)
Please phone in rx as entered below.

## 2013-02-11 ENCOUNTER — Ambulatory Visit
Admission: RE | Admit: 2013-02-11 | Discharge: 2013-02-11 | Disposition: A | Discharge status: Home / Self Care | Payer: BC Managed Care – PPO | Source: Ambulatory Visit | Attending: Family Medicine | Admitting: Family Medicine

## 2013-02-11 DIAGNOSIS — M545 Low back pain: Secondary | ICD-10-CM

## 2013-02-12 ENCOUNTER — Telehealth: Payer: Self-pay | Admitting: *Deleted

## 2013-02-12 ENCOUNTER — Other Ambulatory Visit: Payer: Self-pay | Admitting: Family Medicine

## 2013-02-12 DIAGNOSIS — IMO0002 Reserved for concepts with insufficient information to code with codable children: Secondary | ICD-10-CM

## 2013-02-12 NOTE — Telephone Encounter (Signed)
Left message asking patient to call back

## 2013-02-12 NOTE — Telephone Encounter (Signed)
Pt was given prednisone for back pain.  This helped for awhile but pain is bad again today.  She has one dose left and is asking for something to help with the pain until she can get in with an orthopaedic doctor.  [facility].

## 2013-02-12 NOTE — Telephone Encounter (Signed)
I would advise Tylenol/Alleve for pain after she finishes the prednisone.

## 2013-02-12 NOTE — Telephone Encounter (Signed)
Advised patient.  She says she has been taking tramadol and diclofenac and that doesn't touch the pain, so she says she doubts tylenol or alleve will help either.  Please advise.

## 2013-02-13 NOTE — Telephone Encounter (Signed)
Pt is seeing ortho this afternoon.  She says pain is almost unbearable.

## 2013-02-13 NOTE — Telephone Encounter (Signed)
Sorry to hear that.  Let's see what ortho says since she is seeing them today.

## 2013-02-13 NOTE — Telephone Encounter (Signed)
When is her ortho appt?

## 2013-02-13 NOTE — Telephone Encounter (Signed)
Advised patient

## 2013-06-07 ENCOUNTER — Ambulatory Visit (INDEPENDENT_AMBULATORY_CARE_PROVIDER_SITE_OTHER): Payer: BC Managed Care – PPO

## 2013-06-07 DIAGNOSIS — Z23 Encounter for immunization: Secondary | ICD-10-CM

## 2013-06-12 ENCOUNTER — Encounter: Payer: Self-pay | Admitting: Family Medicine

## 2013-06-18 ENCOUNTER — Ambulatory Visit (INDEPENDENT_AMBULATORY_CARE_PROVIDER_SITE_OTHER): Payer: BC Managed Care – PPO | Admitting: Internal Medicine

## 2013-06-18 ENCOUNTER — Encounter: Payer: Self-pay | Admitting: Internal Medicine

## 2013-06-18 VITALS — BP 128/80 | HR 69 | Temp 98.6°F | Resp 14 | Wt 156.0 lb

## 2013-06-18 DIAGNOSIS — R0789 Other chest pain: Secondary | ICD-10-CM

## 2013-06-18 NOTE — Progress Notes (Signed)
Subjective:    Patient ID: Hannah Vasquez, female    DOB: May 22, 1951, 62 y.o.   MRN: [id number]  HPI In past year, had shingles, pneumonia, herniated disc (injections and PT)  In the past month, has felt exhausted Feels hoarse Gets sense of SOB---has to take a deep breath Gets feeling of nasal numbness and congestion -- "like after a dental visit" Sense of "film over my eyes--- everything is blurred and spins around"--- not true vertigo (more like lightheaded) Now with rash again--worried about shingles again (that was fairly widespread?)  Has had some chest tightness---uncomfortable feeling Has been sedentary due to back History of MVP---she isn't sure about ischemic heart disease. No [facility] records available Some palpitations---rest or exertion. Fairly brief No syncope but occasional dizziness No leg swelling  Current Outpatient Prescriptions on File Prior to Visit  Medication Sig Dispense Refill  . albuterol (PROVENTIL HFA;VENTOLIN HFA) 108 (90 BASE) MCG/ACT inhaler Inhale 2 puffs into the lungs every 4 (four) hours as needed.  1 Inhaler  1  . ALPRAZolam (XANAX) 0.25 MG tablet 1-2 tabs 30 minutes prior to procedure  10 tablet  0  . aspirin EC 81 MG tablet Take 81 mg by mouth every morning.       . Calcium Carbonate-Vit D-Min 600-400 MG-UNIT TABS Take 1 tablet by mouth every morning.       . Cholecalciferol (VITAMIN D3) 1000 UNITS CAPS Take 1 capsule by mouth every morning.       . fluocinonide-emollient (LIDEX-E) 0.05 % cream Apply 1 application topically daily as needed (to legs for rash).      . fluticasone (FLONASE) 50 MCG/ACT nasal spray Place 2 sprays into the nose daily.  16 g  1  . hydrochlorothiazide (HYDRODIURIL) 25 MG tablet Take 12.5 mg by mouth every morning. Take one half by mouth daily      . loratadine (CLARITIN) 10 MG tablet Take 10 mg by mouth every morning.       . metoprolol succinate (TOPROL-XL) 100 MG 24 hr tablet Take 100 mg by mouth every morning.      .  Multiple Vitamin (MULTIVITAMIN) tablet Take 1 tablet by mouth every morning.       . Omega-3 Fatty Acids (FISH OIL) 1200 MG CAPS Take 1 capsule by mouth every morning.        No current facility-administered medications on file prior to visit.    Allergies  Allergen Reactions  . Codeine     REACTION: nausea and vomiting  . Penicillins     REACTION: rash    Past Medical History  Diagnosis Date  . Mitral valve prolapse   . Hemorrhoids   . CAD (coronary artery disease)   . Allergy   . Anxiety   . Osteoporosis   . Hypertension     Past Surgical History  Procedure Laterality Date  . Knee surgery  2003    torn meniscus  . Bladder surgery  11/15/1995    bladder neck suspension, urinary incontinence    Family History  Problem Relation Age of Onset  . Mitral valve prolapse Mother   . Hypertension Father     History   Social History  . Marital Status: Divorced    Spouse Name: N/A    Number of Children: 2  . Years of Education: N/A   Occupational History  . Medical office---front desk     Retired   Social History Main Topics  . Smoking status: Never Smoker   .  Smokeless tobacco: Not on file  . Alcohol Use: Not on file  . Drug Use: Not on file  . Sexual Activity: Not on file   Other Topics Concern  . Not on file   Social History Narrative  . No narrative on file   Review of Systems Sleeps poorly---2-3 hours at night (this is clearly worse than her usual). Winds up napping briefly during the day Has gained 6# in the past month----though she joined weight watchers "everything is out of control" Appetite is okay Never depressed before but now down at times due to the pain in her back. No prolonged down mood No clear cut fever but feels hot at times    Objective:   Physical Exam  Constitutional: She appears well-developed and well-nourished. No distress.  HENT:  Mouth/Throat: Oropharynx is clear and moist. No oropharyngeal exudate.  Neck: Normal range of  motion. Neck supple. No thyromegaly present.  Cardiovascular: Normal rate, regular rhythm, normal heart sounds and intact distal pulses.  Exam reveals no gallop.   No murmur heard. Pulmonary/Chest: Effort normal and breath sounds normal. No respiratory distress. She has no wheezes. She has no rales.  Abdominal: Soft. She exhibits no mass. There is no tenderness.  No HSM  Musculoskeletal: She exhibits no edema and no tenderness.  Lymphadenopathy:    She has no cervical adenopathy.  Skin: No rash noted. No erythema.  Psychiatric:  Not overtly depressed but seems uneasy Appropriate speech and dress Insight and judgement do not appear to be impaired          Assessment & Plan:

## 2013-06-18 NOTE — Patient Instructions (Signed)
Please try over the counter melatonin to help sleep. Start with 31m and if no improvement after a week, increase to 667m If that doesn't help and you are still not sleeping better, please call and we can try a mild prescription medication for this.

## 2013-06-18 NOTE — Assessment & Plan Note (Addendum)
Very vague Doesn't sound ischemic EKG shows only RBBB which is unchanged  Has vague SOB as well Multiple other mild somatic complaints that may be related to multiple medical issues and back pain Not sleeping well Discussed regular hours---can try melatonin and consider trazodone Might consider tricyclic--may help her back pain also

## 2013-10-03 ENCOUNTER — Other Ambulatory Visit: Payer: Self-pay | Admitting: Family Medicine

## 2013-11-08 ENCOUNTER — Ambulatory Visit (INDEPENDENT_AMBULATORY_CARE_PROVIDER_SITE_OTHER): Payer: Medicare HMO | Admitting: Family Medicine

## 2013-11-08 ENCOUNTER — Encounter: Payer: Self-pay | Admitting: Family Medicine

## 2013-11-08 ENCOUNTER — Ambulatory Visit (INDEPENDENT_AMBULATORY_CARE_PROVIDER_SITE_OTHER)
Admission: RE | Admit: 2013-11-08 | Discharge: 2013-11-08 | Disposition: A | Discharge status: Home / Self Care | Payer: Medicare HMO | Source: Ambulatory Visit | Attending: Family Medicine | Admitting: Family Medicine

## 2013-11-08 VITALS — BP 138/78 | HR 58 | Temp 97.7°F | Ht 61.5 in | Wt 158.5 lb

## 2013-11-08 DIAGNOSIS — G609 Hereditary and idiopathic neuropathy, unspecified: Secondary | ICD-10-CM

## 2013-11-08 DIAGNOSIS — M899 Disorder of bone, unspecified: Secondary | ICD-10-CM

## 2013-11-08 DIAGNOSIS — R0789 Other chest pain: Secondary | ICD-10-CM

## 2013-11-08 DIAGNOSIS — M949 Disorder of cartilage, unspecified: Secondary | ICD-10-CM

## 2013-11-08 DIAGNOSIS — R0609 Other forms of dyspnea: Secondary | ICD-10-CM

## 2013-11-08 DIAGNOSIS — I10 Essential (primary) hypertension: Secondary | ICD-10-CM

## 2013-11-08 DIAGNOSIS — Z0000 Encounter for general adult medical examination without abnormal findings: Secondary | ICD-10-CM | POA: Insufficient documentation

## 2013-11-08 DIAGNOSIS — Z23 Encounter for immunization: Secondary | ICD-10-CM

## 2013-11-08 DIAGNOSIS — R0989 Other specified symptoms and signs involving the circulatory and respiratory systems: Secondary | ICD-10-CM

## 2013-11-08 DIAGNOSIS — Z1211 Encounter for screening for malignant neoplasm of colon: Secondary | ICD-10-CM

## 2013-11-08 DIAGNOSIS — Z136 Encounter for screening for cardiovascular disorders: Secondary | ICD-10-CM

## 2013-11-08 LAB — COMPREHENSIVE METABOLIC PANEL
ALK PHOS: 98 U/L (ref 39–117)
ALT: 21 U/L (ref 0–35)
AST: 20 U/L (ref 0–37)
Albumin: 4.1 g/dL (ref 3.5–5.2)
BUN: 19 mg/dL (ref 6–23)
CO2: 29 mEq/L (ref 19–32)
CREATININE: 0.9 mg/dL (ref 0.4–1.2)
Calcium: 9.4 mg/dL (ref 8.4–10.5)
Chloride: 104 mEq/L (ref 96–112)
GFR: 65.09 mL/min (ref >60.00)
Glucose, Bld: 101 mg/dL — ABNORMAL HIGH (ref 70–99)
Potassium: 3.6 mEq/L (ref 3.5–5.1)
Sodium: 140 mEq/L (ref 135–145)
Total Bilirubin: 0.6 mg/dL (ref 0.3–1.2)
Total Protein: 7.4 g/dL (ref 6.0–8.3)

## 2013-11-08 LAB — CBC WITH DIFFERENTIAL/PLATELET
BASOS PCT: 0.6 % (ref 0.0–3.0)
Basophils Absolute: 0 10*3/uL (ref 0.0–0.1)
EOS PCT: 4 % (ref 0.0–5.0)
Eosinophils Absolute: 0.3 10*3/uL (ref 0.0–0.7)
HEMATOCRIT: 42.5 % (ref 36.0–46.0)
HEMOGLOBIN: 14.1 g/dL (ref 12.0–15.0)
LYMPHS ABS: 3.2 10*3/uL (ref 0.7–4.0)
Lymphocytes Relative: 39 % (ref 12.0–46.0)
MCHC: 33.1 g/dL (ref 30.0–36.0)
MCV: 84.3 fl (ref 78.0–100.0)
MONO ABS: 0.6 10*3/uL (ref 0.1–1.0)
Monocytes Relative: 7.4 % (ref 3.0–12.0)
NEUTROS ABS: 4 10*3/uL (ref 1.4–7.7)
Neutrophils Relative %: 49 % (ref 43.0–77.0)
Platelets: 316 10*3/uL (ref 150.0–400.0)
RBC: 5.04 Mil/uL (ref 3.87–5.11)
RDW: 14.1 % (ref 11.5–14.6)
WBC: 8.2 10*3/uL (ref 4.5–10.5)

## 2013-11-08 LAB — BRAIN NATRIURETIC PEPTIDE: PRO B NATRI PEPTIDE: 20 pg/mL (ref 0.0–100.0)

## 2013-11-08 LAB — VITAMIN B12: VITAMIN B 12: 232 pg/mL (ref 211–911)

## 2013-11-08 LAB — TSH: TSH: 2.23 u[IU]/mL (ref 0.35–5.50)

## 2013-11-08 LAB — LIPID PANEL
CHOL/HDL RATIO: 6
Cholesterol: 189 mg/dL (ref 0–200)
HDL: 30.4 mg/dL — ABNORMAL LOW (ref >39.00)
LDL CALC: 124 mg/dL — AB (ref 0–99)
TRIGLYCERIDES: 175 mg/dL — AB (ref 0.0–149.0)
VLDL: 35 mg/dL (ref 0.0–40.0)

## 2013-11-08 MED ORDER — FLUTICASONE PROPIONATE 50 MCG/ACT NA SUSP: 2.0000 | Freq: Every day | NASAL | Status: DC

## 2013-11-08 MED ORDER — ALBUTEROL SULFATE HFA 108 (90 BASE) MCG/ACT IN AERS: 2.0000 | INHALATION_SPRAY | RESPIRATORY_TRACT | Status: DC | PRN

## 2013-11-08 MED ORDER — FLUOCINONIDE-E 0.05 % EX CREA: 1.0000 "application " | TOPICAL_CREAM | Freq: Every day | CUTANEOUS | Status: DC | PRN

## 2013-11-08 MED ORDER — LORATADINE 10 MG PO TABS: 10.0000 mg | ORAL_TABLET | Freq: Every morning | ORAL | Status: AC

## 2013-11-08 MED ORDER — HYDROCHLOROTHIAZIDE 25 MG PO TABS: 12.5000 mg | ORAL_TABLET | Freq: Every morning | ORAL | Status: DC

## 2013-11-08 MED ORDER — METOPROLOL SUCCINATE ER 100 MG PO TB24: ORAL_TABLET | ORAL | Status: DC

## 2013-11-08 NOTE — Assessment & Plan Note (Signed)
The patients weight, height, BMI and visual acuity have been recorded in the chart I have made referrals, counseling and provided education to the patient based review of the above and I have provided the pt with a written personalized care plan for preventive services.  Pneumovax today cologuard- declining colonoscopy at this time due to back pain.

## 2013-11-08 NOTE — Addendum Note (Signed)
Addended by: Modena Nunnery on: 11/08/2013 11:28 AM   Modules accepted: Orders

## 2013-11-08 NOTE — Progress Notes (Signed)
Pre visit review using our clinic review tool, if applicable. No additional management support is needed unless otherwise documented below in the visit note. 

## 2013-11-08 NOTE — Patient Instructions (Addendum)
Good to see you. We will call you with your lab and xray results.  Please stop by to see Rosaria Ferries on your way out to set up your referral.

## 2013-11-08 NOTE — Progress Notes (Signed)
63 yo pleasant female here for Welcome to medicare physical.  I have personally reviewed the Medicare Annual Wellness questionnaire and have noted 1. The patient's medical and social history 2. Their use of alcohol, tobacco or illicit drugs 3. Their current medications and supplements 4. The patient's functional ability including ADL's, fall risks, home safety risks and hearing or visual             impairment. 5. Diet and physical activities 6. Evidence for depression or mood disorders  End of life wishes discussed and updated in Social History.  Has had a rough year- has  had shingles, pneumonia, herniated disc (injections and PT). Saw Rich Letvak in 06/2013 while I was on maternity leave for SOB- EKG unchanged, felt it was more related to insomnia and chronic medical complaints.  Continues to have these symptoms.  DOE is persistent- "really bad" when exercises with silver sneakers.  Well woman- s/p hysterectomy in 2007 for fibroids. Mammogram 06/11/13 DEXA 09/24/11- osteopenia- due.  Stopped taking fosamax this year due to her back issues.  Colonoscopy 12/20/2002- 10 year recall- was due in 12/2012. Zoster 11/19/2009  Due for labs as well.   Patient Active Problem List   Diagnosis Date Noted  . Chest tightness 06/18/2013  . EYE PAIN, RIGHT 09/22/2010  . HYPERTENSION, BENIGN 08/27/2010  . UNSPEC SYMPTOM ASSOC W/FEMALE GENITAL ORGANS 08/27/2010  . ANXIETY STATE, UNSPECIFIED 06/27/2009  . CAD 06/27/2009  . MITRAL VALVE PROLAPSE 06/27/2009  . HEMORRHOIDS 06/27/2009  . ALLERGIC RHINITIS 06/27/2009  . OSTEOPENIA 11/11/2006   Past Medical History  Diagnosis Date  . Mitral valve prolapse   . Hemorrhoids   . CAD (coronary artery disease)   . Allergy   . Anxiety   . Osteoporosis   . Hypertension    Past Surgical History  Procedure Laterality Date  . Knee surgery  2003    torn meniscus  . Bladder surgery  11/15/1995    bladder neck suspension, urinary incontinence   History   Substance Use Topics  . Smoking status: Never Smoker   . Smokeless tobacco: Not on file  . Alcohol Use: Not on file   Family History  Problem Relation Age of Onset  . Mitral valve prolapse Mother   . Hypertension Father    Allergies  Allergen Reactions  . Codeine     REACTION: nausea and vomiting  . Penicillins     REACTION: rash   Current Outpatient Prescriptions on File Prior to Visit  Medication Sig Dispense Refill  . albuterol (PROVENTIL HFA;VENTOLIN HFA) 108 (90 BASE) MCG/ACT inhaler Inhale 2 puffs into the lungs every 4 (four) hours as needed.  1 Inhaler  1  . ALPRAZolam (XANAX) 0.25 MG tablet 1-2 tabs 30 minutes prior to procedure  10 tablet  0  . aspirin EC 81 MG tablet Take 81 mg by mouth every morning.       . Calcium Carbonate-Vit D-Min 600-400 MG-UNIT TABS Take 1 tablet by mouth every morning.       . Cholecalciferol (VITAMIN D3) 1000 UNITS CAPS Take 1 capsule by mouth every morning.       . fluocinonide-emollient (LIDEX-E) 0.05 % cream Apply 1 application topically daily as needed (to legs for rash).      . fluticasone (FLONASE) 50 MCG/ACT nasal spray Place 2 sprays into the nose daily.  16 g  1  . hydrochlorothiazide (HYDRODIURIL) 25 MG tablet Take 12.5 mg by mouth every morning. Take one half by mouth daily      .  loratadine (CLARITIN) 10 MG tablet Take 10 mg by mouth every morning.       . metoprolol succinate (TOPROL-XL) 100 MG 24 hr tablet TAKE ONE (1) TABLET BY MOUTH EACH DAY  30 tablet  0  . Multiple Vitamin (MULTIVITAMIN) tablet Take 1 tablet by mouth every morning.       . Omega-3 Fatty Acids (FISH OIL) 1200 MG CAPS Take 1 capsule by mouth every morning.        No current facility-administered medications on file prior to visit.   The PMH, PSH, Social History, Family History, Medications, and allergies have been reviewed in [facility], and have been updated if relevant.  Review of Systems       See HPI  +cough, no hemoptysis +fatigue +reflux +no blood in her  stool +anxiety, no depression  +HA +back pain -no Weakness    Physical Exam Ht 5' 1.5" (1.562 m)  Wt 158 lb 8 oz (71.895 kg)  BMI 29.47 kg/m2 Wt Readings from Last 3 Encounters:  11/08/13 158 lb 8 oz (71.895 kg)  06/18/13 156 lb (70.761 kg)  02/05/13 149 lb (67.586 kg)     General:  alert, well-developed, well-nourished, and well-hydrated.   Head:  normocephalic and atraumatic.   Eyes:  vision grossly intact, pupils equal, pupils round, and pupils reactive to light.   Ears:  R ear normal and L ear normal.   Nose:  nose piercing noted.   Mouth:  good dentition.   Neck:  No deformities, masses, or tenderness noted. Lungs:  Normal respiratory effort, chest expands symmetrically. Lungs are clear to auscultation, no crackles or wheezes. Heart:  normal rate, regular rhythm, and no murmur.   Abdomen:  Bowel sounds positive,abdomen soft and non-tender without masses, organomegaly or hernias noted. Msk:  No deformity or scoliosis noted of thoracic or lumbar spine.   Extremities:  no edema Neurologic:  alert & oriented X3 and gait normal.   Skin:  Intact without suspicious lesions or rashes Psych:  normally interactive and subdued.    Assessment and Plan:

## 2013-11-08 NOTE — Assessment & Plan Note (Signed)
Off fosamax. Will re order dexa.

## 2013-11-08 NOTE — Assessment & Plan Note (Signed)
Well controlled. No changes.

## 2013-11-08 NOTE — Assessment & Plan Note (Signed)
With DOE- persistent, if not progressive. EKG- RBBB unchanged.  Symptoms do warrant cardio referral/stress test. Check labs as well today.  CXR.  Orders Placed This Encounter  Procedures  . DG Bone Density  . DG Chest 2 View  . Cologuard  . Comprehensive metabolic panel  . TSH  . CBC with Differential  . Lipid panel  . Brain natriuretic peptide  . Vitamin B12  . Ambulatory referral to Cardiac Electrophysiology  . EKG 12-Lead

## 2013-11-14 ENCOUNTER — Telehealth: Payer: Self-pay

## 2013-11-14 NOTE — Telephone Encounter (Signed)
Pt left v/m requesting cb about recent lab results and testing.

## 2013-11-15 NOTE — Telephone Encounter (Signed)
Spoke to pt who states that she is wanting more information about her cologuard.

## 2013-11-15 NOTE — Telephone Encounter (Signed)
I do not see results from cologuard in Epic.

## 2013-11-15 NOTE — Telephone Encounter (Signed)
Pt left v/m requesting cb.

## 2013-11-15 NOTE — Telephone Encounter (Signed)
Lm on pts vm requesting a call back

## 2013-11-20 NOTE — Telephone Encounter (Signed)
Spoke to pt and advised per Dr Deborra Medina that we will not be using cologuard but instead will be using the normal hemoccult cards.

## 2013-11-26 ENCOUNTER — Institutional Professional Consult (permissible substitution): Payer: Medicare HMO | Admitting: Cardiology

## 2013-11-28 ENCOUNTER — Encounter: Payer: Self-pay | Admitting: Internal Medicine

## 2013-12-13 ENCOUNTER — Other Ambulatory Visit: Payer: Self-pay | Admitting: Family Medicine

## 2013-12-13 ENCOUNTER — Institutional Professional Consult (permissible substitution): Payer: Medicare HMO | Admitting: Cardiology

## 2013-12-19 ENCOUNTER — Ambulatory Visit (INDEPENDENT_AMBULATORY_CARE_PROVIDER_SITE_OTHER): Payer: Medicare HMO | Admitting: Cardiology

## 2013-12-19 ENCOUNTER — Encounter: Payer: Self-pay | Admitting: Cardiology

## 2013-12-19 VITALS — BP 144/66 | HR 64 | Ht 61.5 in | Wt 158.4 lb

## 2013-12-19 DIAGNOSIS — I341 Nonrheumatic mitral (valve) prolapse: Secondary | ICD-10-CM | POA: Insufficient documentation

## 2013-12-19 DIAGNOSIS — I10 Essential (primary) hypertension: Secondary | ICD-10-CM

## 2013-12-19 DIAGNOSIS — I059 Rheumatic mitral valve disease, unspecified: Secondary | ICD-10-CM

## 2013-12-19 DIAGNOSIS — R0989 Other specified symptoms and signs involving the circulatory and respiratory systems: Secondary | ICD-10-CM

## 2013-12-19 DIAGNOSIS — R0600 Dyspnea, unspecified: Secondary | ICD-10-CM

## 2013-12-19 DIAGNOSIS — R0609 Other forms of dyspnea: Secondary | ICD-10-CM

## 2013-12-19 DIAGNOSIS — I4510 Unspecified right bundle-branch block: Secondary | ICD-10-CM

## 2013-12-19 NOTE — Patient Instructions (Signed)
Your physician has requested that you have an echocardiogram. Echocardiography is a painless test that uses sound waves to create images of your heart. It provides your doctor with information about the size and shape of your heart and how well your heart's chambers and valves are working. This procedure takes approximately one hour. There are no restrictions for this procedure.  Your physician recommends that you continue on your current medications as directed. Please refer to the Current Medication list given to you today.    Your physician wants you to follow-up in: 1 year with Dr. Dawna Part will receive a reminder letter in the mail two months in advance. If you don't receive a letter, please call our office to schedule the follow-up appointment.

## 2013-12-19 NOTE — Progress Notes (Signed)
[facility] [address], [address] [place], [place]  [address] Phone: [phone] Fax:  [phone]  Date:  12/19/2013   ID:  Raegan, Winders [birth year], MRN [id number]  PCP:  Arnette Norris, MD   History of Present Illness: Hannah Vasquez is a 63 y.o. female here for the evaluation of chest tightness. In the prior year she has had shingles, pneumonia, back pain, exhaustion.  9/13 - shingles then PNA. Said she had shingles all over?. Started on leg. A doctor at the hospital when she was visiting her father turned to her and asked her what was wrong. He then got a second opinion and told her that she had shingles. 5/14 - back pain, injections, PT 3/15 - feeling so out of breath, walking from here to door. Fatigue.   3 weeks ago just to walk was complete out of breath. No chest pain. At times feels chest tightness. When SOB happens, sometimes just talking at rest, needs to stop and take a deep breath. Felt winded.   Currently, she appears very comfortable. I witnessed her walking through the clinic without difficulty.  Her father, 72 has had a rough year as well. He is now back to exercise.  In 2008 she underwent nuclear stress test which was low risk with no ischemia and normal ejection fraction. She also had an echocardiogram which showed normal ejection fraction and mild mitral valve prolapse.   Wt Readings from Last 3 Encounters:  12/19/13 158 lb 6.4 oz (71.85 kg)  11/08/13 158 lb 8 oz (71.895 kg)  06/18/13 156 lb (70.761 kg)     Past Medical History  Diagnosis Date  . Mitral valve prolapse   . Hemorrhoids   . CAD (coronary artery disease)   . Allergy   . Anxiety   . Osteoporosis   . Hypertension     Past Surgical History  Procedure Laterality Date  . Knee surgery  2003    torn meniscus  . Bladder surgery  11/15/1995    bladder neck suspension, urinary incontinence    Current Outpatient Prescriptions  Medication Sig Dispense Refill  . albuterol (PROVENTIL  HFA;VENTOLIN HFA) 108 (90 BASE) MCG/ACT inhaler Inhale 2 puffs into the lungs every 4 (four) hours as needed.  1 Inhaler  1  . ALPRAZolam (XANAX) 0.25 MG tablet 1-2 tabs 30 minutes prior to procedure  10 tablet  0  . aspirin EC 81 MG tablet Take 81 mg by mouth every morning.       . Calcium Carbonate-Vit D-Min 600-400 MG-UNIT TABS Take 1 tablet by mouth every morning.       . Cholecalciferol (VITAMIN D3) 1000 UNITS CAPS Take 1 capsule by mouth every morning.       . fluocinonide-emollient (LIDEX-E) 0.05 % cream Apply 1 application topically daily as needed (to legs for rash).  30 g  2  . fluticasone (FLONASE) 50 MCG/ACT nasal spray Place 2 sprays into both nostrils as needed.      . hydrochlorothiazide (HYDRODIURIL) 25 MG tablet Take 0.5 tablets (12.5 mg total) by mouth every morning. Take one half by mouth daily  30 tablet  1  . loratadine (CLARITIN) 10 MG tablet Take 1 tablet (10 mg total) by mouth every morning.  30 tablet  2  . metoprolol succinate (TOPROL-XL) 100 MG 24 hr tablet TAKE ONE (1) TABLET BY MOUTH EACH DAY  30 tablet  2  . Multiple Vitamin (MULTIVITAMIN) tablet Take 1 tablet by  mouth every morning.       . Omega-3 Fatty Acids (FISH OIL) 1200 MG CAPS Take 1 capsule by mouth every morning.        No current facility-administered medications for this visit.    Allergies:    Allergies  Allergen Reactions  . Codeine     REACTION: nausea and vomiting  . Penicillins     REACTION: rash    Social History:  The patient  reports that she has never smoked. She does not have any smokeless tobacco history on file.  Retired Family History  Problem Relation Age of Onset  . Mitral valve prolapse Mother   . Hypertension Father     ROS:  Please see the history of present illness.   No HA, no fevers.    All other systems reviewed and negative.   PHYSICAL EXAM: VS:  BP 144/66  Pulse 64  Ht 5' 1.5" (1.562 m)  Wt 158 lb 6.4 oz (71.85 kg)  BMI 29.45 kg/m2  SpO2 99% Well nourished,  well developed, in no acute distress HEENT: normal, [place]/AT, EOMI Neck: no JVD, normal carotid upstroke, no bruit Cardiac:  normal S1, S2; RRR; soft systolic murmur apex Lungs:  clear to auscultation bilaterally, no wheezing, rhonchi or rales Abd: soft, nontender, no hepatomegaly, no bruits Ext: no edema, 2+ distal pulses Skin: warm and dry GU: deferred Neuro: no focal abnormalities noted, AAO x 3, mildly anxious  EKG:  SR RBBB.   Chronic LABS: LDL 124, creatinine 0.9, hemoglobin 14.1, vitamin B12 232, TSH 2.23  ASSESSMENT AND PLAN:  1. Shortness of breath-check an echocardiogram given her prior history of mitral valve prolapse with mild mitral regurgitation. I do not hear any worsening murmur however I would like to make sure that she does not have more significant mitral regurgitation. 2. Continue to encourage exercise, [employer], silver sneakers. I'm comfortable with her proceeding with this gradually increasing her stamina. 3. Mitral valve prolapse-previously mild in 2008 4. Hypertension-medications reviewed. 5. RBBB - chronic 6. 1 year follow up.  Signed, Candee Furbish, MD [facility]  12/19/2013 1:53 PM

## 2013-12-25 ENCOUNTER — Encounter: Payer: Self-pay | Admitting: Family Medicine

## 2014-01-03 ENCOUNTER — Other Ambulatory Visit: Payer: Self-pay

## 2014-01-03 ENCOUNTER — Ambulatory Visit (HOSPITAL_COMMUNITY): Payer: Medicare HMO | Attending: Cardiovascular Disease | Admitting: Radiology

## 2014-01-03 ENCOUNTER — Telehealth: Payer: Self-pay

## 2014-01-03 DIAGNOSIS — R0989 Other specified symptoms and signs involving the circulatory and respiratory systems: Principal | ICD-10-CM | POA: Insufficient documentation

## 2014-01-03 DIAGNOSIS — R079 Chest pain, unspecified: Secondary | ICD-10-CM

## 2014-01-03 DIAGNOSIS — R0609 Other forms of dyspnea: Secondary | ICD-10-CM | POA: Insufficient documentation

## 2014-01-03 DIAGNOSIS — R0602 Shortness of breath: Secondary | ICD-10-CM

## 2014-01-03 DIAGNOSIS — I341 Nonrheumatic mitral (valve) prolapse: Secondary | ICD-10-CM

## 2014-01-03 DIAGNOSIS — I059 Rheumatic mitral valve disease, unspecified: Secondary | ICD-10-CM

## 2014-01-03 NOTE — Telephone Encounter (Signed)
Spoke to pt and informed her of results.

## 2014-01-03 NOTE — Telephone Encounter (Signed)
Pt left v/m requesting cb with results of Bone density test.

## 2014-01-03 NOTE — Progress Notes (Signed)
Echocardiogram performed.  

## 2014-01-03 NOTE — Telephone Encounter (Signed)
She has low bone mass (osteopenia)- continue good intake of calcium and vit d, weight bearing exercise and repeat bone density in 2 years.

## 2014-03-05 ENCOUNTER — Other Ambulatory Visit: Payer: Self-pay | Admitting: Family Medicine

## 2014-04-05 ENCOUNTER — Other Ambulatory Visit: Payer: Self-pay | Admitting: Family Medicine

## 2014-05-03 ENCOUNTER — Other Ambulatory Visit: Payer: Self-pay | Admitting: Family Medicine

## 2014-06-12 ENCOUNTER — Ambulatory Visit (INDEPENDENT_AMBULATORY_CARE_PROVIDER_SITE_OTHER): Payer: Medicare HMO

## 2014-06-12 DIAGNOSIS — Z23 Encounter for immunization: Secondary | ICD-10-CM

## 2014-06-25 ENCOUNTER — Telehealth: Payer: Self-pay | Admitting: Family Medicine

## 2014-06-25 ENCOUNTER — Encounter: Payer: Self-pay | Admitting: Internal Medicine

## 2014-06-25 ENCOUNTER — Ambulatory Visit (INDEPENDENT_AMBULATORY_CARE_PROVIDER_SITE_OTHER): Payer: Medicare HMO | Admitting: Internal Medicine

## 2014-06-25 VITALS — BP 130/70 | HR 58 | Temp 98.5°F | Ht 61.5 in | Wt 159.8 lb

## 2014-06-25 DIAGNOSIS — T7840XA Allergy, unspecified, initial encounter: Secondary | ICD-10-CM

## 2014-06-25 DIAGNOSIS — I10 Essential (primary) hypertension: Secondary | ICD-10-CM

## 2014-06-25 MED ORDER — METHYLPREDNISOLONE ACETATE 40 MG/ML IJ SUSP
40.0000 mg | Freq: Once | INTRAMUSCULAR | Status: AC
  Administered 2014-06-25: 40 mg via INTRAMUSCULAR

## 2014-06-25 NOTE — Patient Instructions (Addendum)
Take the benadryl as we discussed.    Steroid taper as directed.    Call tomorrow with update.

## 2014-06-25 NOTE — Telephone Encounter (Signed)
Patient Information:  Caller Name: [name]  Phone: [phone]  Patient: Hannah Vasquez  Gender: Female  DOB: [birth year]  Age: 63 Years  PCP: Arnette Norris [facility])  Office Follow Up:  Does the office need to follow up with this patient?: No  Instructions For The Office: N/A  RN Note:  Onset 11/7 with raised skin on face.  Pt reports that face neck and scalp are involved.  Left side of face has a "pimple", Right side of face is the raised skin areas.  She reports that pain is moderate and burning.  Pt is Afebrile.She report that she does have a history of shingles in this same place.  Review of Epic on 04/2012 she was seen and at first Clindamycin was given and followed with Valtrex.  [facility] does not have appt for today.  Pt agrees to [place] at [facility] [place] station.  Appt scheduled for 11:15 with Dr Nicki Reaper for evaluation.  Call back information given  Symptoms  Reason For Call & Symptoms: Rash -facem scalp and neck  Reviewed Health History In EMR: Yes  Reviewed Medications In EMR: Yes  Reviewed Allergies In EMR: Yes  Reviewed Surgeries / Procedures: Yes  Date of Onset of Symptoms: 06/22/2014  Guideline(s) Used:  Rash or Redness - Localized  Disposition Per Guideline:   See Today in Office  Reason For Disposition Reached:   Painful rash and has multiple small blisters grouped together in one area of body (i.e., dermatomal distribution or "band" or "stripe")  Advice Given:  Call Back If:  You become worse.  Patient Will Follow Care Advice:  YES  Appointment Scheduled:  06/25/2014 11:15:00 Appointment Scheduled Provider:  Other

## 2014-06-25 NOTE — Progress Notes (Signed)
Pre visit review using our clinic review tool, if applicable. No additional management support is needed unless otherwise documented below in the visit note. 

## 2014-06-26 ENCOUNTER — Telehealth: Payer: Self-pay | Admitting: *Deleted

## 2014-06-26 ENCOUNTER — Telehealth: Payer: Self-pay | Admitting: Internal Medicine

## 2014-06-26 ENCOUNTER — Encounter: Payer: Self-pay | Admitting: Internal Medicine

## 2014-06-26 DIAGNOSIS — T7840XA Allergy, unspecified, initial encounter: Secondary | ICD-10-CM | POA: Insufficient documentation

## 2014-06-26 NOTE — Telephone Encounter (Signed)
The patient called to inform the physician that everything is the same and that she is no worse . She stated hopefully when she finishes her dose today she will see improvement.

## 2014-06-26 NOTE — Progress Notes (Signed)
Subjective:    Patient ID: Hannah Vasquez, female    DOB: Jan 04, 1951, 63 y.o.   MRN: [id number]  HPI 63 year old female with past history of hypertension and MVP who comes in today as a work in with concerns regarding a rash.  Pt of Dr Deborra Medina.  She states that starting Sunday, she noticed a lesion on her right cheek.  Since then, she has developed a more diffuse rash on her cheeks, under her chin and spread to her forehead.  She also feels like her scalp is itching and having some itching her her lower legs and arms.  No rash noted on her back, abdomen, chest, arms or legs.  She does describe that the left side of her throat feels like when has had novacaine.  No tongue swelling.  No sob.  Has occurred since she ate breakfast this am.  She denies any new soaps, shampoos, hair dye or other products.  No new medication.  She has not eaten anything unusual.  Not on ACE inhibitor.  Put alcohol on her face yesterday, to help the rash.  No vomiting.     Past Medical History  Diagnosis Date  . Mitral valve prolapse   . Hemorrhoids   . CAD (coronary artery disease)   . Allergy   . Anxiety   . Osteoporosis   . Hypertension     Current Outpatient Prescriptions on File Prior to Visit  Medication Sig Dispense Refill  . albuterol (PROVENTIL HFA;VENTOLIN HFA) 108 (90 BASE) MCG/ACT inhaler Inhale 2 puffs into the lungs every 4 (four) hours as needed. 1 Inhaler 1  . aspirin EC 81 MG tablet Take 81 mg by mouth every morning.     . Calcium Carbonate-Vit D-Min 600-400 MG-UNIT TABS Take 1 tablet by mouth every morning.     . Cholecalciferol (VITAMIN D3) 1000 UNITS CAPS Take 1 capsule by mouth every morning.     . fluocinonide-emollient (LIDEX-E) 0.05 % cream Apply 1 application topically daily as needed (to legs for rash). 30 g 2  . fluticasone (FLONASE) 50 MCG/ACT nasal spray Place 2 sprays into both nostrils as needed.    . hydrochlorothiazide (HYDRODIURIL) 25 MG tablet TAKE 1/2 (HALF) A TABLET BY MOUTH  EVERY MORNING. 45 tablet 0  . loratadine (CLARITIN) 10 MG tablet Take 1 tablet (10 mg total) by mouth every morning. 30 tablet 2  . metoprolol succinate (TOPROL-XL) 100 MG 24 hr tablet TAKE 1 TABLET BY MOUTH DAILY 30 tablet 2  . Multiple Vitamin (MULTIVITAMIN) tablet Take 1 tablet by mouth every morning.     . Omega-3 Fatty Acids (FISH OIL) 1200 MG CAPS Take 1 capsule by mouth every morning.      No current facility-administered medications on file prior to visit.    Review of Systems No headache or dizziness.  Rash over face and below chin (including neck) as outlined.  Sensation change in her throat, which she feels has worsened over the last two hours.  No tongue swelling.  Eating.  No vomiting.  No sob.  No chest pain or tightness.  States was diagnosed with shingles a couple of years ago and felt like this when first started.  No new contacts or exposures.       Objective:   Physical Exam Filed Vitals:   06/25/14 1113  BP: 130/70  Pulse: 58  Temp: 98.5 F (28.46 C)   63 year old female in no acute distress.   HEENT:  Nares-  clear.  Oropharynx - without lesions.  No tongue swelling.  No lip swelling.   NECK:  Supple.  Nontender.   HEART:  Appears to be regular. LUNGS:  No crackles or wheezing audible.  Respirations even and unlabored.  SKIN:  Diffuse erythematous rash noted over there face, forehead and up into her hair line.  Also noted beneath her chin and on her neck.  No rash noted on her trunk, arms or legs.  Appears to be c/w an allergic reaction.           Assessment & Plan:  1. Allergic reaction, initial encounter Rash appears to be more c/w an allergic reaction.  Does not appear to be c/w shingles.  She is experiencing a change in sensation in her throat.  She was given methylPREDNISolone acetate (DEPO-MEDROL) injection 40 mg; Inject 1 mL (40 mg total) into the muscle once here in the office.  Was observed for approximately 1.5-2 hours.  No progression of her symptoms.   She actually felt some better prior to leaving.  Rash looked improved.  Will have her continue her antihistamine in the am and add low dose benadryl in the evening.  Call with update in the am.  Follow closely.  Any change or worsening symptoms, she is to be evaluated.  She was comfortable with this plan.    2. HYPERTENSION, BENIGN Blood pressure controlled.  Follow.

## 2014-06-26 NOTE — Telephone Encounter (Signed)
Since she is on steroids, I would hold on a topical steroid cream (since involving the face).  Do no put any more alcohol on her face.  Let us know if persistent problems.

## 2014-06-26 NOTE — Telephone Encounter (Signed)
Pt.notified

## 2014-06-26 NOTE — Telephone Encounter (Signed)
See below

## 2014-06-26 NOTE — Telephone Encounter (Signed)
Sent to wrong provider (re-routed to Dr. Nicki Reaper since she saw patient this week)

## 2014-06-26 NOTE — Telephone Encounter (Signed)
Pt wants to know if there is anything that she can use topically on her rash. Pt was seen yesterday, please advise. (pt afraid that it will leave a scar or something because it is so dry)

## 2014-06-26 NOTE — Telephone Encounter (Signed)
[place].  Thanks for update.  Keep [place] posted.

## 2014-07-03 ENCOUNTER — Other Ambulatory Visit: Payer: Self-pay | Admitting: Family Medicine

## 2014-07-05 ENCOUNTER — Ambulatory Visit (INDEPENDENT_AMBULATORY_CARE_PROVIDER_SITE_OTHER): Payer: Medicare HMO | Admitting: Family Medicine

## 2014-07-05 ENCOUNTER — Encounter: Payer: Self-pay | Admitting: Family Medicine

## 2014-07-05 ENCOUNTER — Telehealth: Payer: Self-pay | Admitting: Internal Medicine

## 2014-07-05 VITALS — BP 128/74 | HR 55 | Temp 98.3°F | Wt 158.0 lb

## 2014-07-05 DIAGNOSIS — R21 Rash and other nonspecific skin eruption: Secondary | ICD-10-CM | POA: Insufficient documentation

## 2014-07-05 MED ORDER — PREDNISONE 20 MG PO TABS: ORAL_TABLET | ORAL | Status: DC

## 2014-07-05 NOTE — Progress Notes (Signed)
BP 128/74 mmHg  Pulse 55  Temp(Src) 98.3 F (36.8 C) (Oral)  Wt 158 lb (71.668 kg)  SpO2 98%   CC: rash  Subjective:    Patient ID: Hannah Vasquez, female    DOB: 1950/09/11, 63 y.o.   MRN: [id number]  HPI: Hannah Vasquez is a 63 y.o. female presenting on 07/05/2014 for Rash   2 wk h/o rash to upper chest.   Seen 06/25/2014 by Dr Nicki Reaper with facial rash and dx allergic reaction of unknown trigger, treated with depo medrol injection and steroid taper and antihistamine. Finished steroid taper on Monday.  Now facial rash has resolved but persistent itching. Now with chest rash that has worsened over last 2-3 days. Describes burning pain.  No new lotions, detergents, soaps, shampoos, foods, medications.  No sun exposure.  No bonfires, no outdoor exposure. No similar rash in family members.   No fevers/chills, nausea, oral lesions.  She had similar event 04/2012 ?folliculitis vs shingles treated with doxy then valtrex course. H/o remote derm eval for spot on lip by Dr Radford Pax - but that was different to current rash.  She did receive zostavax at age 35yo.   Relevant past medical, surgical, family and social history reviewed and updated as indicated.  Allergies and medications reviewed and updated. Current Outpatient Prescriptions on File Prior to Visit  Medication Sig  . albuterol (PROVENTIL HFA;VENTOLIN HFA) 108 (90 BASE) MCG/ACT inhaler Inhale 2 puffs into the lungs every 4 (four) hours as needed.  Marland Kitchen aspirin EC 81 MG tablet Take 81 mg by mouth every morning.   . Calcium Carbonate-Vit D-Min 600-400 MG-UNIT TABS Take 1 tablet by mouth every morning.   . Cholecalciferol (VITAMIN D3) 1000 UNITS CAPS Take 1 capsule by mouth every morning.   . fluocinonide-emollient (LIDEX-E) 0.05 % cream Apply 1 application topically daily as needed (to legs for rash).  . fluticasone (FLONASE) 50 MCG/ACT nasal spray Place 2 sprays into both nostrils as needed.  . hydrochlorothiazide (HYDRODIURIL) 25 MG  tablet TAKE 1/2 (HALF) A TABLET BY MOUTH EVERY MORNING.  Marland Kitchen loratadine (CLARITIN) 10 MG tablet Take 1 tablet (10 mg total) by mouth every morning.  . metoprolol succinate (TOPROL-XL) 100 MG 24 hr tablet TAKE 1 TABLET BY MOUTH DAILY  . Multiple Vitamin (MULTIVITAMIN) tablet Take 1 tablet by mouth every morning.   . Omega-3 Fatty Acids (FISH OIL) 1200 MG CAPS Take 1 capsule by mouth every morning.    No current facility-administered medications on file prior to visit.    Review of Systems Per HPI unless specifically indicated above    Objective:    BP 128/74 mmHg  Pulse 55  Temp(Src) 98.3 F (36.8 C) (Oral)  Wt 158 lb (71.668 kg)  SpO2 98%  Physical Exam  Constitutional: She appears well-developed and well-nourished. No distress.  Skin: Skin is warm and dry. Rash noted. There is erythema.  Erythematous maculopapular rash on anterior chest with spreading into neck and behind bilateral ears and with some confluent areas on chest, pruritic and tender  Nursing note and vitals reviewed.      Assessment & Plan:   Problem List Items Addressed This Visit    Rash and nonspecific skin eruption - Primary    Still consistent with irritant or contact dermatitis (steroid responsive)but unknown trigger. This is similar to prior prolonged skin rash episode she had 04/2012. Will extend steroid taper and refer to derm for further evaluation. Pt agrees with plan.    Relevant Orders  Ambulatory referral to Dermatology       Follow up plan: Return if symptoms worsen or fail to improve.

## 2014-07-05 NOTE — Assessment & Plan Note (Signed)
Still consistent with irritant or contact dermatitis (steroid responsive)but unknown trigger. This is similar to prior prolonged skin rash episode she had 04/2012. Will extend steroid taper and refer to derm for further evaluation. Pt agrees with plan.

## 2014-07-05 NOTE — Telephone Encounter (Signed)
Pt has already been scheduled with Dr Danise Mina for today

## 2014-07-05 NOTE — Telephone Encounter (Signed)
I did not see her for this and I am not in the office today.  Unfortunately she would need to be seen for further tx.  Please schedule her with one of my partners today.

## 2014-07-05 NOTE — Telephone Encounter (Signed)
Patient Information:  Caller Name: Legna  Phone: [phone]  Patient: Hannah Vasquez  Gender: Female  DOB: 11-Feb-1951  Age: 63 Years  PCP: Einar Pheasant  Office Follow Up:  Does the office need to follow up with this patient?: Yes  Instructions For The Office: Rash on face  RN Note:  Patient calling regarding recent diagnosis of allergic reaction.  She has completed all med's and denies throat discomfort now, but continues with rash on face which has now spread to neck.  States "I have had shingles in the past and this feels like it did at that time."  describes as itchy, painful and feels like a bad sunburn."  Symptoms  Reason For Call & Symptoms: rash of face and neck  Reviewed Health History In EMR: Yes  Reviewed Medications In EMR: Yes  Reviewed Allergies In EMR: Yes  Reviewed Surgeries / Procedures: Yes  Date of Onset of Symptoms: 06/25/2014  Guideline(s) Used:  Rash or Redness - Localized  Disposition Per Guideline:   See Today in Office  Reason For Disposition Reached:   Painful rash and has multiple small blisters grouped together in one area of body (i.e., dermatomal distribution or "band" or "stripe")  Advice Given:  Call Back If:  Rash spreads or becomes worse  You become worse.  Patient Will Follow Care Advice:  YES

## 2014-07-05 NOTE — Telephone Encounter (Signed)
Please advise. Pt notified that this message will be sent to Dr. Deborra Medina for follow-up & she should be hearing from her office soon.

## 2014-07-05 NOTE — Progress Notes (Signed)
Pre visit review using our clinic review tool, if applicable. No additional management support is needed unless otherwise documented below in the visit note. 

## 2014-07-05 NOTE — Patient Instructions (Signed)
I think this is a contact or irritant dermatitis (reaction to something you're being exposed to). Let's extend steroid course and we will refer you back to [facility] dermatology. Pass by Rosaria Ferries or Alayasia's office for this appointment.

## 2014-07-30 ENCOUNTER — Ambulatory Visit (INDEPENDENT_AMBULATORY_CARE_PROVIDER_SITE_OTHER): Payer: Medicare HMO | Admitting: Family Medicine

## 2014-07-30 ENCOUNTER — Ambulatory Visit: Payer: Medicare HMO | Admitting: Internal Medicine

## 2014-07-30 ENCOUNTER — Encounter: Payer: Self-pay | Admitting: Family Medicine

## 2014-07-30 VITALS — BP 110/68 | HR 62 | Temp 98.2°F | Wt 160.5 lb

## 2014-07-30 DIAGNOSIS — J209 Acute bronchitis, unspecified: Secondary | ICD-10-CM

## 2014-07-30 MED ORDER — BENZONATATE 200 MG PO CAPS: 200.0000 mg | ORAL_CAPSULE | Freq: Two times a day (BID) | ORAL | Status: DC | PRN

## 2014-07-30 MED ORDER — AZITHROMYCIN 250 MG PO TABS: ORAL_TABLET | ORAL | Status: DC

## 2014-07-30 NOTE — Progress Notes (Signed)
Pre visit review using our clinic review tool, if applicable. No additional management support is needed unless otherwise documented below in the visit note. 

## 2014-07-30 NOTE — Progress Notes (Signed)
SUBJECTIVE:  Hannah Vasquez is a 63 y.o. female who complains of coryza, congestion, sore throat, post nasal drip, dry cough, SOB, fatigue and bilateral sinus pain for 15 days. She denies a history of fevers and myalgias and admits to a history of asthma. Patient denies smoke cigarettes.   Using her albuterol prn more frequently past few days.  Still using inhaled steroid (flonase) and Claritin daily. Had a recent "allergic rash" per dermatology.  Was seen for this rash last month by Dr. Nicki Reaper and Dr. Darnell Level and was eventually sent to dermatology. Notes reviewed.  Current Outpatient Prescriptions on File Prior to Visit  Medication Sig Dispense Refill  . albuterol (PROVENTIL HFA;VENTOLIN HFA) 108 (90 BASE) MCG/ACT inhaler Inhale 2 puffs into the lungs every 4 (four) hours as needed. 1 Inhaler 1  . aspirin EC 81 MG tablet Take 81 mg by mouth every morning.     . Calcium Carbonate-Vit D-Min 600-400 MG-UNIT TABS Take 1 tablet by mouth every morning.     . Cholecalciferol (VITAMIN D3) 1000 UNITS CAPS Take 1 capsule by mouth every morning.     . fluocinonide-emollient (LIDEX-E) 0.05 % cream Apply 1 application topically daily as needed (to legs for rash). 30 g 2  . fluticasone (FLONASE) 50 MCG/ACT nasal spray Place 2 sprays into both nostrils as needed.    . hydrochlorothiazide (HYDRODIURIL) 25 MG tablet TAKE 1/2 (HALF) A TABLET BY MOUTH EVERY MORNING. 45 tablet 0  . loratadine (CLARITIN) 10 MG tablet Take 1 tablet (10 mg total) by mouth every morning. 30 tablet 2  . metoprolol succinate (TOPROL-XL) 100 MG 24 hr tablet TAKE 1 TABLET BY MOUTH DAILY 30 tablet 0  . Multiple Vitamin (MULTIVITAMIN) tablet Take 1 tablet by mouth every morning.     . Omega-3 Fatty Acids (FISH OIL) 1200 MG CAPS Take 1 capsule by mouth every morning.      No current facility-administered medications on file prior to visit.    Allergies  Allergen Reactions  . Codeine     REACTION: nausea and vomiting  . Penicillins    REACTION: rash    Past Medical History  Diagnosis Date  . Mitral valve prolapse   . Hemorrhoids   . CAD (coronary artery disease)   . Allergy   . Anxiety   . Osteoporosis   . Hypertension     Past Surgical History  Procedure Laterality Date  . Knee surgery  2003    torn meniscus  . Bladder surgery  11/15/1995    bladder neck suspension, urinary incontinence    Family History  Problem Relation Age of Onset  . Mitral valve prolapse Mother   . Hypertension Father     History   Social History  . Marital Status: Divorced    Spouse Name: N/A    Number of Children: 2  . Years of Education: N/A   Occupational History  . Medical office---front desk     Retired   Social History Main Topics  . Smoking status: Never Smoker   . Smokeless tobacco: Not on file  . Alcohol Use: Not on file  . Drug Use: Not on file  . Sexual Activity: Not on file   Other Topics Concern  . Not on file   Social History Narrative   Has a living will.   HPOA- daughter.   She would desire CPR.  Would desire life support if necessary and not futile.         The PMH,  PSH, Social History, Family History, Medications, and allergies have been reviewed in [facility], and have been updated if relevant.  OBJECTIVE: BP 110/68 mmHg  Pulse 62  Temp(Src) 98.2 F (36.8 C) (Oral)  Wt 160 lb 8 oz (72.802 kg)  SpO2 96%  She appears well, vital signs are as noted. Ears normal.  Throat and pharynx normal.  Neck supple. No adenopathy in the neck. Nose is congested. Sinuses non tender. Faint exp wheezes on right  ASSESSMENT:  bronchitis  PLAN: Given duration and progression of symptoms, will treat for bacterial sinusitis. PCN allergic- Zpack and as needed Tesslon perles. Symptomatic therapy suggested: push fluids, rest and return office visit prn if symptoms persist or worsen. Call or return to clinic prn if these symptoms worsen or fail to improve as anticipated.

## 2014-07-30 NOTE — Patient Instructions (Signed)
Great to see you. Happy Holidays.  Take Zpack as directed. Continue your other medications.  Tessalon perles as needed for cough. Keep me updated.

## 2014-08-01 ENCOUNTER — Telehealth: Payer: Self-pay

## 2014-08-01 NOTE — Telephone Encounter (Signed)
Pt left v/m; pt was seen on 07/30/14 with bronchitis and pt has been taking zpak;today pt started with S/T scratchy early morning and has worsened as the day has gone on and rt earache. Pt has slight h/a and dizziness on and off; no fever; the cough has improved. Pt wants to know if can get med especially for earache at pharmacy or OTC. Pt request cb. [place]. Dr Deborra Medina does not return to office until 08/05/14.

## 2014-08-01 NOTE — Telephone Encounter (Signed)
Would suggest ibuprofen 44m twice daily with meals for R earache. If persistent pain despite this let [place] may need stronger oral pain med or re evaluation. Unfortunately there's no longer an ear drop specific for earache that I know of.

## 2014-08-01 NOTE — Telephone Encounter (Signed)
Message left advising patient.

## 2014-08-20 ENCOUNTER — Other Ambulatory Visit: Payer: Self-pay

## 2014-08-20 MED ORDER — METOPROLOL SUCCINATE ER 100 MG PO TB24: 100.0000 mg | ORAL_TABLET | Freq: Every day | ORAL | Status: DC

## 2014-08-20 NOTE — Telephone Encounter (Signed)
Pt left v/m requesting refill toprol to [facility]; pt previously advised needed appt prior to more refills; pt has CPX scheduled 11/27/2014. Is it OK to refill until CPX? Pt request cb when refilled.

## 2014-10-15 ENCOUNTER — Other Ambulatory Visit: Payer: Self-pay | Admitting: Family Medicine

## 2014-11-27 ENCOUNTER — Encounter: Payer: Self-pay | Admitting: Family Medicine

## 2014-11-27 ENCOUNTER — Ambulatory Visit (INDEPENDENT_AMBULATORY_CARE_PROVIDER_SITE_OTHER): Payer: Medicare HMO | Admitting: Family Medicine

## 2014-11-27 VITALS — BP 130/72 | HR 65 | Temp 98.1°F | Ht 62.25 in | Wt 159.2 lb

## 2014-11-27 DIAGNOSIS — Z0000 Encounter for general adult medical examination without abnormal findings: Secondary | ICD-10-CM

## 2014-11-27 DIAGNOSIS — F411 Generalized anxiety disorder: Secondary | ICD-10-CM

## 2014-11-27 DIAGNOSIS — I059 Rheumatic mitral valve disease, unspecified: Secondary | ICD-10-CM | POA: Diagnosis not present

## 2014-11-27 DIAGNOSIS — Z23 Encounter for immunization: Secondary | ICD-10-CM

## 2014-11-27 DIAGNOSIS — R5383 Other fatigue: Secondary | ICD-10-CM | POA: Diagnosis not present

## 2014-11-27 DIAGNOSIS — Z1211 Encounter for screening for malignant neoplasm of colon: Secondary | ICD-10-CM

## 2014-11-27 DIAGNOSIS — I10 Essential (primary) hypertension: Secondary | ICD-10-CM

## 2014-11-27 LAB — CBC WITH DIFFERENTIAL/PLATELET
BASOS ABS: 0.1 10*3/uL (ref 0.0–0.1)
Basophils Relative: 0.6 % (ref 0.0–3.0)
EOS ABS: 0.5 10*3/uL (ref 0.0–0.7)
EOS PCT: 5 % (ref 0.0–5.0)
HEMATOCRIT: 42 % (ref 36.0–46.0)
Hemoglobin: 14.3 g/dL (ref 12.0–15.0)
Lymphocytes Relative: 32.8 % (ref 12.0–46.0)
Lymphs Abs: 3 10*3/uL (ref 0.7–4.0)
MCHC: 34 g/dL (ref 30.0–36.0)
MCV: 80.3 fl (ref 78.0–100.0)
MONO ABS: 0.5 10*3/uL (ref 0.1–1.0)
Monocytes Relative: 5.9 % (ref 3.0–12.0)
Neutro Abs: 5.1 10*3/uL (ref 1.4–7.7)
Neutrophils Relative %: 55.7 % (ref 43.0–77.0)
Platelets: 302 10*3/uL (ref 150.0–400.0)
RBC: 5.23 Mil/uL — ABNORMAL HIGH (ref 3.87–5.11)
RDW: 14.1 % (ref 11.5–15.5)
WBC: 9.1 10*3/uL (ref 4.0–10.5)

## 2014-11-27 LAB — COMPREHENSIVE METABOLIC PANEL
ALBUMIN: 4 g/dL (ref 3.5–5.2)
ALK PHOS: 126 U/L — AB (ref 39–117)
ALT: 19 U/L (ref 0–35)
AST: 18 U/L (ref 0–37)
BUN: 15 mg/dL (ref 6–23)
CALCIUM: 9.5 mg/dL (ref 8.4–10.5)
CO2: 28 mEq/L (ref 19–32)
Chloride: 102 mEq/L (ref 96–112)
Creatinine, Ser: 0.89 mg/dL (ref 0.40–1.20)
GFR: 67.41 mL/min (ref >60.00)
Glucose, Bld: 107 mg/dL — ABNORMAL HIGH (ref 70–99)
Potassium: 3.7 mEq/L (ref 3.5–5.1)
Sodium: 139 mEq/L (ref 135–145)
Total Bilirubin: 0.5 mg/dL (ref 0.2–1.2)
Total Protein: 7.5 g/dL (ref 6.0–8.3)

## 2014-11-27 LAB — VITAMIN D 25 HYDROXY (VIT D DEFICIENCY, FRACTURES): VITD: 39.48 ng/mL (ref 30.00–100.00)

## 2014-11-27 LAB — LIPID PANEL
CHOLESTEROL: 168 mg/dL (ref 0–200)
HDL: 27.9 mg/dL — AB (ref >39.00)
LDL Cholesterol: 102 mg/dL — ABNORMAL HIGH (ref 0–99)
NONHDL: 140.1
Total CHOL/HDL Ratio: 6
Triglycerides: 189 mg/dL — ABNORMAL HIGH (ref 0.0–149.0)
VLDL: 37.8 mg/dL (ref 0.0–40.0)

## 2014-11-27 LAB — TSH: TSH: 2.93 u[IU]/mL (ref 0.35–4.50)

## 2014-11-27 LAB — VITAMIN B12: Vitamin B-12: 183 pg/mL — ABNORMAL LOW (ref 211–911)

## 2014-11-27 MED ORDER — METOPROLOL SUCCINATE ER 100 MG PO TB24: 100.0000 mg | ORAL_TABLET | Freq: Every day | ORAL | Status: DC

## 2014-11-27 NOTE — Patient Instructions (Signed)
Great to see you. We will call you with your lab results. Please schedule your mammogram and return your stool cards as advised.

## 2014-11-27 NOTE — Assessment & Plan Note (Signed)
Well controlled.  NO changes made.

## 2014-11-27 NOTE — Progress Notes (Signed)
64 yo pleasant female here for medicare wellness visit.  I have personally reviewed the Medicare Annual Wellness questionnaire and have noted 1. The patient's medical and social history 2. Their use of alcohol, tobacco or illicit drugs 3. Their current medications and supplements 4. The patient's functional ability including ADL's, fall risks, home safety risks and hearing or visual             impairment. 5. Diet and physical activities 6. Evidence for depression or mood disorders  End of life wishes discussed and updated in Social History.  The roster of all physicians providing medical care to patient - is listed in the CareTeams section of the chart.  Remote h/o hysterectomy Influenza vaccine 06/12/14 Mammogram 06/08/14 Tdap 09/09/11 Zoster 11/19/09 Pneumovax 11/08/13 Colonoscopy 12/20/2002 (Dr. Olevia Perches)- 10 year recall- was due in 12/2012.  Has been more tired lately.  Father is on home hospice now- she is his primary care giver.  Still remaining active with her grandchildren as well to keep the normalciy in their lives.  Not much time for herself.  Saw Dr. Marlou Porch last year- echo from 01/03/14 reviewed- Normal EF.  HTN- BP has been well controlled with HCTZ at current dose. Denies HA, blurred vision, CP or SOB.  Lab Results  Component Value Date   CHOL 189 11/08/2013   HDL 30.40* 11/08/2013   LDLCALC 124* 11/08/2013   LDLDIRECT 151.0 09/09/2011   TRIG 175.0* 11/08/2013   CHOLHDL 6 11/08/2013   Lab Results  Component Value Date   CREATININE 0.9 11/08/2013   Lab Results  Component Value Date   WBC 8.2 11/08/2013   HGB 14.1 11/08/2013   HCT 42.5 11/08/2013   MCV 84.3 11/08/2013   PLT 316.0 11/08/2013   Lab Results  Component Value Date   TSH 2.23 11/08/2013      Patient Active Problem List   Diagnosis Date Noted  . Medicare annual wellness visit, subsequent 11/27/2014  . Rash and nonspecific skin eruption 07/05/2014  . Allergic reaction 06/26/2014  . RBBB  12/19/2013  . Mitral valve prolapse   . Welcome to Medicare preventive visit 11/08/2013  . Chest tightness 06/18/2013  . HYPERTENSION, BENIGN 08/27/2010  . UNSPEC SYMPTOM ASSOC W/FEMALE GENITAL ORGANS 08/27/2010  . Anxiety state 06/27/2009  . CAD 06/27/2009  . MITRAL VALVE PROLAPSE 06/27/2009  . HEMORRHOIDS 06/27/2009  . ALLERGIC RHINITIS 06/27/2009  . OSTEOPENIA 11/11/2006   Past Medical History  Diagnosis Date  . Mitral valve prolapse   . Hemorrhoids   . CAD (coronary artery disease)   . Allergy   . Anxiety   . Osteoporosis   . Hypertension    Past Surgical History  Procedure Laterality Date  . Knee surgery  2003    torn meniscus  . Bladder surgery  11/15/1995    bladder neck suspension, urinary incontinence   History  Substance Use Topics  . Smoking status: Never Smoker   . Smokeless tobacco: Not on file  . Alcohol Use: Not on file   Family History  Problem Relation Age of Onset  . Mitral valve prolapse Mother   . Hypertension Father    Allergies  Allergen Reactions  . Codeine     REACTION: nausea and vomiting  . Penicillins     REACTION: rash   Current Outpatient Prescriptions on File Prior to Visit  Medication Sig Dispense Refill  . albuterol (PROVENTIL HFA;VENTOLIN HFA) 108 (90 BASE) MCG/ACT inhaler Inhale 2 puffs into the lungs every 4 (four) hours as  needed. 1 Inhaler 1  . aspirin EC 81 MG tablet Take 81 mg by mouth every morning.     . Calcium Carbonate-Vit D-Min 600-400 MG-UNIT TABS Take 1 tablet by mouth every morning.     . Cholecalciferol (VITAMIN D3) 1000 UNITS CAPS Take 1 capsule by mouth every morning.     Marland Kitchen desonide (DESOWEN) 0.05 % cream Apply topically 2 (two) times daily.    . fluocinonide-emollient (LIDEX-E) 0.05 % cream Apply 1 application topically daily as needed (to legs for rash). 30 g 2  . fluticasone (FLONASE) 50 MCG/ACT nasal spray Place 2 sprays into both nostrils as needed.    . hydrochlorothiazide (HYDRODIURIL) 25 MG tablet TAKE  ONE-HALF TABLET BY MOUTH EVERY MORNING 45 tablet 0  . ketoconazole (NIZORAL) 2 % cream Apply 1 application topically daily.    Marland Kitchen loratadine (CLARITIN) 10 MG tablet Take 1 tablet (10 mg total) by mouth every morning. 30 tablet 2  . Multiple Vitamin (MULTIVITAMIN) tablet Take 1 tablet by mouth every morning.     . Omega-3 Fatty Acids (FISH OIL) 1200 MG CAPS Take 1 capsule by mouth every morning.      No current facility-administered medications on file prior to visit.   The PMH, PSH, Social History, Family History, Medications, and allergies have been reviewed in [facility], and have been updated if relevant.  Review of Systems       See HPI  Review of Systems  Constitutional: Positive for fatigue.  HENT: Negative.   Eyes: Negative.   Respiratory: Negative.   Cardiovascular: Negative.   Gastrointestinal: Negative.   Endocrine: Negative.   Genitourinary: Negative.   Musculoskeletal: Negative.   Skin: Negative.   Allergic/Immunologic: Negative.   Neurological: Negative.   Hematological: Negative.   Psychiatric/Behavioral: Negative.   All other systems reviewed and are negative.      Physical Exam BP 130/72 mmHg  Pulse 65  Temp(Src) 98.1 F (36.7 C) (Oral)  Ht 5' 2.25" (1.581 m)  Wt 159 lb 4 oz (72.235 kg)  BMI 28.90 kg/m2  SpO2 97% Wt Readings from Last 3 Encounters:  11/27/14 159 lb 4 oz (72.235 kg)  07/30/14 160 lb 8 oz (72.802 kg)  07/05/14 158 lb (71.668 kg)     General:  Well-developed,well-nourished,in no acute distress; alert,appropriate and cooperative throughout examination Head:  normocephalic and atraumatic.   Eyes:  vision grossly intact, pupils equal, pupils round, and pupils reactive to light.   Ears:  R ear normal and L ear normal.   Nose:  no external deformity.   Mouth:  good dentition.   Neck:  No deformities, masses, or tenderness noted. Breasts:  No mass, nodules, thickening, tenderness, bulging, retraction, inflamation, nipple discharge or skin  changes noted.   Lungs:  Normal respiratory effort, chest expands symmetrically. Lungs are clear to auscultation, no crackles or wheezes. Heart:  Normal rate and regular rhythm. Systolic murmur, unchanged. Abdomen:  Bowel sounds positive,abdomen soft and non-tender without masses, organomegaly or hernias noted. Msk:  No deformity or scoliosis noted of thoracic or lumbar spine.   Extremities:  No clubbing, cyanosis, edema, or deformity noted with normal full range of motion of all joints.   Neurologic:  alert & oriented X3 and gait normal.   Skin:  Intact without suspicious lesions or rashes Cervical Nodes:  No lymphadenopathy noted Axillary Nodes:  No palpable lymphadenopathy Psych:  Cognition and judgment appear intact. Alert and cooperative with normal attention span and concentration. No apparent delusions, illusions, hallucinations

## 2014-11-27 NOTE — Progress Notes (Signed)
Pre visit review using our clinic review tool, if applicable. No additional management support is needed unless otherwise documented below in the visit note. 

## 2014-11-27 NOTE — Assessment & Plan Note (Addendum)
The patients weight, height, BMI and visual acuity have been recorded in the chart I have made referrals, counseling and provided education to the patient based review of the above and I have provided the pt with a written personalized care plan for preventive services.  Prevnar 13 today.  Mammogram ordered.  IFOB.  Orders Placed This Encounter  Procedures  . Fecal occult blood, imunochemical  . CBC with Differential/Platelet  . Comprehensive metabolic panel  . Lipid panel  . TSH  . Vitamin B12  . Vitamin D, 25-hydroxy

## 2014-11-27 NOTE — Assessment & Plan Note (Signed)
Under more stress but she feels she is coping ok.  Offered my support and suggested she talk with hospice counselors as well. The patient indicates understanding of these issues and agrees with the plan.

## 2014-11-27 NOTE — Addendum Note (Signed)
Addended by: Modena Nunnery on: 11/27/2014 09:08 AM   Modules accepted: Orders

## 2014-11-27 NOTE — Addendum Note (Signed)
Addended by: Ellamae Sia on: 11/27/2014 11:45 AM   Modules accepted: Orders

## 2014-11-27 NOTE — Assessment & Plan Note (Signed)
New- likely multifactorial. Feels she is sleeping ok but she is under more stress. Will check other labs today to rule out other possible contributing factors. The patient indicates understanding of these issues and agrees with the plan.

## 2014-11-27 NOTE — Assessment & Plan Note (Signed)
Followed by Dr. Marlou Porch.

## 2014-12-02 ENCOUNTER — Encounter: Payer: Self-pay | Admitting: *Deleted

## 2014-12-03 ENCOUNTER — Other Ambulatory Visit (INDEPENDENT_AMBULATORY_CARE_PROVIDER_SITE_OTHER): Payer: Medicare HMO

## 2014-12-03 DIAGNOSIS — Z1211 Encounter for screening for malignant neoplasm of colon: Secondary | ICD-10-CM | POA: Diagnosis not present

## 2014-12-03 LAB — FECAL OCCULT BLOOD, IMMUNOCHEMICAL: FECAL OCCULT BLD: NEGATIVE

## 2015-01-14 ENCOUNTER — Other Ambulatory Visit: Payer: Self-pay | Admitting: Family Medicine

## 2015-04-08 ENCOUNTER — Other Ambulatory Visit (INDEPENDENT_AMBULATORY_CARE_PROVIDER_SITE_OTHER): Payer: Medicare HMO

## 2015-04-08 DIAGNOSIS — E538 Deficiency of other specified B group vitamins: Secondary | ICD-10-CM

## 2015-04-08 LAB — VITAMIN B12: Vitamin B-12: 870 pg/mL (ref 211–911)

## 2015-04-09 ENCOUNTER — Encounter: Payer: Self-pay | Admitting: Family Medicine

## 2015-05-09 ENCOUNTER — Ambulatory Visit (INDEPENDENT_AMBULATORY_CARE_PROVIDER_SITE_OTHER): Payer: Medicare HMO

## 2015-05-09 DIAGNOSIS — Z23 Encounter for immunization: Secondary | ICD-10-CM | POA: Diagnosis not present

## 2015-07-28 ENCOUNTER — Ambulatory Visit (INDEPENDENT_AMBULATORY_CARE_PROVIDER_SITE_OTHER): Payer: Medicare HMO | Admitting: Family Medicine

## 2015-07-28 ENCOUNTER — Encounter: Payer: Self-pay | Admitting: Family Medicine

## 2015-07-28 VITALS — BP 142/80 | HR 61 | Temp 97.4°F | Wt 144.2 lb

## 2015-07-28 DIAGNOSIS — R059 Cough, unspecified: Secondary | ICD-10-CM

## 2015-07-28 DIAGNOSIS — R05 Cough: Secondary | ICD-10-CM

## 2015-07-28 NOTE — Progress Notes (Signed)
SUBJECTIVE:  Hannah Vasquez is a 64 y.o. female who complains of post nasal drip and dry cough for 8 days. She denies a history of anorexia, chest pain, chills, dizziness, fatigue, fevers, myalgias, nausea, shortness of breath, sweats and vomiting and denies a history of asthma. Patient denies smoke cigarettes.   Current Outpatient Prescriptions on File Prior to Visit  Medication Sig Dispense Refill  . albuterol (PROVENTIL HFA;VENTOLIN HFA) 108 (90 BASE) MCG/ACT inhaler Inhale 2 puffs into the lungs every 4 (four) hours as needed. 1 Inhaler 1  . aspirin EC 81 MG tablet Take 81 mg by mouth every morning.     . Calcium Carbonate-Vit D-Min 600-400 MG-UNIT TABS Take 1 tablet by mouth every morning.     . Cholecalciferol (VITAMIN D3) 1000 UNITS CAPS Take 1 capsule by mouth every morning.     Marland Kitchen desonide (DESOWEN) 0.05 % cream Apply topically 2 (two) times daily.    . fluocinonide-emollient (LIDEX-E) 0.05 % cream Apply 1 application topically daily as needed (to legs for rash). 30 g 2  . fluticasone (FLONASE) 50 MCG/ACT nasal spray Place 2 sprays into both nostrils as needed.    . hydrochlorothiazide (HYDRODIURIL) 25 MG tablet TAKE ONE-HALF TABLET BY MOUTH EVERY MORNING 45 tablet 1  . ketoconazole (NIZORAL) 2 % cream Apply 1 application topically daily.    Marland Kitchen loratadine (CLARITIN) 10 MG tablet Take 1 tablet (10 mg total) by mouth every morning. 30 tablet 2  . metoprolol succinate (TOPROL-XL) 100 MG 24 hr tablet Take 1 tablet (100 mg total) by mouth daily. Take with or immediately following a meal. 30 tablet 10  . Multiple Vitamin (MULTIVITAMIN) tablet Take 1 tablet by mouth every morning.     . naproxen (NAPROSYN) 500 MG tablet Take 500 mg by mouth 2 (two) times daily with a meal.    . Omega-3 Fatty Acids (FISH OIL) 1200 MG CAPS Take 1 capsule by mouth every morning.      No current facility-administered medications on file prior to visit.    Allergies  Allergen Reactions  . Codeine     REACTION:  nausea and vomiting  . Penicillins     REACTION: rash    Past Medical History  Diagnosis Date  . Mitral valve prolapse   . Hemorrhoids   . CAD (coronary artery disease)   . Allergy   . Anxiety   . Osteoporosis   . Hypertension     Past Surgical History  Procedure Laterality Date  . Knee surgery  2003    torn meniscus  . Bladder surgery  11/15/1995    bladder neck suspension, urinary incontinence    Family History  Problem Relation Age of Onset  . Mitral valve prolapse Mother   . Hypertension Father     Social History   Social History  . Marital Status: Divorced    Spouse Name: N/A  . Number of Children: 2  . Years of Education: N/A   Occupational History  . Medical office---front desk     Retired   Social History Main Topics  . Smoking status: Never Smoker   . Smokeless tobacco: Not on file  . Alcohol Use: Not on file  . Drug Use: Not on file  . Sexual Activity: Not on file   Other Topics Concern  . Not on file   Social History Narrative   Has a living will.   HPOA- daughter.   She would desire CPR.  Would desire life support if  necessary and not futile.         The PMH, PSH, Social History, Family History, Medications, and allergies have been reviewed in [facility], and have been updated if relevant.  OBJECTIVE: BP 142/80 mmHg  Pulse 61  Temp(Src) 97.4 F (36.3 C) (Oral)  Wt 144 lb 4 oz (65.431 kg)  SpO2 98%  She appears well, vital signs are as noted. Ears normal.  Throat and pharynx normal.  Neck supple. No adenopathy in the neck. Nose is congested. Sinuses non tender. The chest is clear, without wheezes or rales.  ASSESSMENT:  viral upper respiratory illness  PLAN: Symptomatic therapy suggested: push fluids, rest and return office visit prn if symptoms persist or worsen. Lack of antibiotic effectiveness discussed with her. Call or return to clinic prn if these symptoms worsen or fail to improve as anticipated.

## 2015-07-28 NOTE — Patient Instructions (Signed)
Great to see you. Please drink lots of fluids. Take mucinex for next few days.  Keep me updated.  Happy Holidays.

## 2015-07-28 NOTE — Progress Notes (Signed)
Pre visit review using our clinic review tool, if applicable. No additional management support is needed unless otherwise documented below in the visit note. 

## 2015-07-30 ENCOUNTER — Other Ambulatory Visit: Payer: Self-pay | Admitting: Family Medicine

## 2015-08-17 HISTORY — PX: MOHS SURGERY: SUR867

## 2015-09-16 ENCOUNTER — Encounter: Payer: Self-pay | Admitting: Family Medicine

## 2015-09-16 ENCOUNTER — Ambulatory Visit (INDEPENDENT_AMBULATORY_CARE_PROVIDER_SITE_OTHER): Payer: PPO | Admitting: Family Medicine

## 2015-09-16 VITALS — BP 130/82 | HR 65 | Temp 98.3°F | Ht 62.0 in | Wt 145.5 lb

## 2015-09-16 DIAGNOSIS — Z0000 Encounter for general adult medical examination without abnormal findings: Secondary | ICD-10-CM

## 2015-09-16 DIAGNOSIS — R94120 Abnormal auditory function study: Secondary | ICD-10-CM | POA: Diagnosis not present

## 2015-09-16 DIAGNOSIS — Z1159 Encounter for screening for other viral diseases: Secondary | ICD-10-CM | POA: Diagnosis not present

## 2015-09-16 DIAGNOSIS — I10 Essential (primary) hypertension: Secondary | ICD-10-CM

## 2015-09-16 LAB — CBC WITH DIFFERENTIAL/PLATELET
BASOS ABS: 0 10*3/uL (ref 0.0–0.1)
Basophils Relative: 0.4 % (ref 0.0–3.0)
EOS ABS: 0.3 10*3/uL (ref 0.0–0.7)
EOS PCT: 3.5 % (ref 0.0–5.0)
HCT: 42.3 % (ref 36.0–46.0)
Hemoglobin: 14 g/dL (ref 12.0–15.0)
LYMPHS ABS: 3 10*3/uL (ref 0.7–4.0)
Lymphocytes Relative: 34.9 % (ref 12.0–46.0)
MCHC: 33.1 g/dL (ref 30.0–36.0)
MCV: 81.6 fl (ref 78.0–100.0)
MONOS PCT: 5.3 % (ref 3.0–12.0)
Monocytes Absolute: 0.5 10*3/uL (ref 0.1–1.0)
Neutro Abs: 4.9 10*3/uL (ref 1.4–7.7)
Neutrophils Relative %: 55.9 % (ref 43.0–77.0)
PLATELETS: 309 10*3/uL (ref 150.0–400.0)
RBC: 5.18 Mil/uL — ABNORMAL HIGH (ref 3.87–5.11)
RDW: 14 % (ref 11.5–15.5)
WBC: 8.7 10*3/uL (ref 4.0–10.5)

## 2015-09-16 LAB — COMPREHENSIVE METABOLIC PANEL
ALT: 14 U/L (ref 0–35)
AST: 18 U/L (ref 0–37)
Albumin: 4.2 g/dL (ref 3.5–5.2)
Alkaline Phosphatase: 106 U/L (ref 39–117)
BILIRUBIN TOTAL: 0.6 mg/dL (ref 0.2–1.2)
BUN: 15 mg/dL (ref 6–23)
CO2: 31 meq/L (ref 19–32)
Calcium: 9.7 mg/dL (ref 8.4–10.5)
Chloride: 106 mEq/L (ref 96–112)
Creatinine, Ser: 0.74 mg/dL (ref 0.40–1.20)
GFR: 83.21 mL/min (ref >60.00)
Glucose, Bld: 98 mg/dL (ref 70–99)
Potassium: 3.8 mEq/L (ref 3.5–5.1)
SODIUM: 143 meq/L (ref 135–145)
Total Protein: 7.5 g/dL (ref 6.0–8.3)

## 2015-09-16 LAB — LIPID PANEL
CHOLESTEROL: 178 mg/dL (ref 0–200)
HDL: 31.5 mg/dL — ABNORMAL LOW (ref >39.00)
LDL Cholesterol: 109 mg/dL — ABNORMAL HIGH (ref 0–99)
NonHDL: 146.81
Total CHOL/HDL Ratio: 6
Triglycerides: 188 mg/dL — ABNORMAL HIGH (ref 0.0–149.0)
VLDL: 37.6 mg/dL (ref 0.0–40.0)

## 2015-09-16 LAB — TSH: TSH: 3.34 u[IU]/mL (ref 0.35–4.50)

## 2015-09-16 NOTE — Patient Instructions (Signed)
Great to see you. We will call you with your results from today and you can view them online as well.

## 2015-09-16 NOTE — Addendum Note (Signed)
Addended by: Lucille Passy on: 09/16/2015 08:01 AM   Modules accepted: Orders, SmartSet

## 2015-09-16 NOTE — Progress Notes (Signed)
Pre visit review using our clinic review tool, if applicable. No additional management support is needed unless otherwise documented below in the visit note. 

## 2015-09-16 NOTE — Progress Notes (Addendum)
65 yo pleasant female here for medicare wellness visit.  I have personally reviewed the Medicare Annual Wellness questionnaire and have noted 1. The patient's medical and social history 2. Their use of alcohol, tobacco or illicit drugs 3. Their current medications and supplements 4. The patient's functional ability including ADL's, fall risks, home safety risks and hearing or visual             impairment. 5. Diet and physical activities 6. Evidence for depression or mood disorders  End of life wishes discussed and updated in Social History.  The roster of all physicians providing medical care to patient - is listed in the CareTeams section of the chart.  Remote h/o hysterectomy Influenza vaccine 05/09/15 Mammogram 04/08/15 Tdap 09/09/11 Zoster 11/19/09 Pneumovax 11/08/13 Prevnar 13 11/27/14 Colonoscopy 08/2006  Saw her dermatologist in 08/10/15  Father passed away in 02/07/15.  Feels she is coping well with this.  He was sick for some time.  CAD- Last saw Dr. Marlou Porch 01/03/14 reviewed- Normal EF.  HTN- BP has been well controlled with HCTZ at current dose. Denies HA, blurred vision, CP or SOB.   Working on her weight.  Just joined [employer] and has been cutting back on calories.  Lab Results  Component Value Date   CHOL 168 11/27/2014   HDL 27.90* 11/27/2014   LDLCALC 102* 11/27/2014   LDLDIRECT 151.0 09/09/2011   TRIG 189.0* 11/27/2014   CHOLHDL 6 11/27/2014   Lab Results  Component Value Date   CREATININE 0.89 11/27/2014   Lab Results  Component Value Date   WBC 9.1 11/27/2014   HGB 14.3 11/27/2014   HCT 42.0 11/27/2014   MCV 80.3 11/27/2014   PLT 302.0 11/27/2014   Lab Results  Component Value Date   TSH 2.93 11/27/2014      Patient Active Problem List   Diagnosis Date Noted  . Medicare annual wellness visit, initial 09/16/2015  . Fatigue 11/27/2014  . Allergic reaction 06/26/2014  . RBBB 12/19/2013  . Mitral valve prolapse   . HYPERTENSION, BENIGN  08/27/2010  . UNSPEC SYMPTOM ASSOC W/FEMALE GENITAL ORGANS 08/27/2010  . Anxiety state 06/27/2009  . CAD 06/27/2009  . Mitral valve disorder 06/27/2009  . HEMORRHOIDS 06/27/2009  . ALLERGIC RHINITIS 06/27/2009  . OSTEOPENIA 11/11/2006   Past Medical History  Diagnosis Date  . Mitral valve prolapse   . Hemorrhoids   . CAD (coronary artery disease)   . Allergy   . Anxiety   . Osteoporosis   . Hypertension    Past Surgical History  Procedure Laterality Date  . Knee surgery  2003    torn meniscus  . Bladder surgery  11/15/1995    bladder neck suspension, urinary incontinence   Social History  Substance Use Topics  . Smoking status: Never Smoker   . Smokeless tobacco: None  . Alcohol Use: None   Family History  Problem Relation Age of Onset  . Mitral valve prolapse Mother   . Hypertension Father    Allergies  Allergen Reactions  . Codeine     REACTION: nausea and vomiting  . Penicillins     REACTION: rash   Current Outpatient Prescriptions on File Prior to Visit  Medication Sig Dispense Refill  . albuterol (PROVENTIL HFA;VENTOLIN HFA) 108 (90 BASE) MCG/ACT inhaler Inhale 2 puffs into the lungs every 4 (four) hours as needed. 1 Inhaler 1  . aspirin EC 81 MG tablet Take 81 mg by mouth every morning.     . Calcium  Carbonate-Vit D-Min 600-400 MG-UNIT TABS Take 1 tablet by mouth every morning.     . Cholecalciferol (VITAMIN D3) 1000 UNITS CAPS Take 1 capsule by mouth every morning.     Marland Kitchen desonide (DESOWEN) 0.05 % cream Apply topically 2 (two) times daily.    . fluocinonide-emollient (LIDEX-E) 0.05 % cream Apply 1 application topically daily as needed (to legs for rash). 30 g 2  . fluticasone (FLONASE) 50 MCG/ACT nasal spray Place 2 sprays into both nostrils as needed.    . hydrochlorothiazide (HYDRODIURIL) 25 MG tablet TAKE ONE-HALF TABLET BY MOUTH EVERY MORNING 45 tablet 1  . ketoconazole (NIZORAL) 2 % cream Apply 1 application topically daily.    Marland Kitchen loratadine (CLARITIN)  10 MG tablet Take 1 tablet (10 mg total) by mouth every morning. 30 tablet 2  . metoprolol succinate (TOPROL-XL) 100 MG 24 hr tablet Take 1 tablet (100 mg total) by mouth daily. Take with or immediately following a meal. 30 tablet 10  . Multiple Vitamin (MULTIVITAMIN) tablet Take 1 tablet by mouth every morning.     . naproxen (NAPROSYN) 500 MG tablet Take 500 mg by mouth 2 (two) times daily with a meal.    . Omega-3 Fatty Acids (FISH OIL) 1200 MG CAPS Take 1 capsule by mouth every morning.      No current facility-administered medications on file prior to visit.   The PMH, PSH, Social History, Family History, Medications, and allergies have been reviewed in [facility], and have been updated if relevant.  Review of Systems       See HPI  Review of Systems  Constitutional: Negative for fatigue.  HENT: Negative.   Eyes: Negative.   Respiratory: Negative.   Cardiovascular: Negative.   Gastrointestinal: Negative.   Endocrine: Negative.   Genitourinary: Negative.   Musculoskeletal: Negative.   Skin: Negative.   Allergic/Immunologic: Negative.   Neurological: Negative.   Hematological: Negative.   Psychiatric/Behavioral: Negative.   All other systems reviewed and are negative.      Physical Exam BP 130/82 mmHg  Pulse 65  Temp(Src) 98.3 F (36.8 C) (Oral)  Ht 5' 2"  (1.575 m)  Wt 145 lb 8 oz (65.998 kg)  BMI 26.61 kg/m2  SpO2 97% Wt Readings from Last 3 Encounters:  09/16/15 145 lb 8 oz (65.998 kg)  07/28/15 144 lb 4 oz (65.431 kg)  11/27/14 159 lb 4 oz (72.235 kg)     General:  Well-developed,well-nourished,in no acute distress; alert,appropriate and cooperative throughout examination Head:  normocephalic and atraumatic.   Eyes:  vision grossly intact, pupils equal, pupils round, and pupils reactive to light.   Ears:  R ear normal and L ear normal.   Nose:  no external deformity.   Mouth:  good dentition.   Neck:  No deformities, masses, or tenderness noted. Breasts:  No  mass, nodules, thickening, tenderness, bulging, retraction, inflamation, nipple discharge or skin changes noted.   Lungs:  Normal respiratory effort, chest expands symmetrically. Lungs are clear to auscultation, no crackles or wheezes. Heart:  Normal rate and regular rhythm. Systolic murmur, unchanged. Abdomen:  Bowel sounds positive,abdomen soft and non-tender without masses, organomegaly or hernias noted. Msk:  No deformity or scoliosis noted of thoracic or lumbar spine.   Extremities:  No clubbing, cyanosis, edema, or deformity noted with normal full range of motion of all joints.   Neurologic:  alert & oriented X3 and gait normal.   Skin:  Intact without suspicious lesions or rashes Cervical Nodes:  No lymphadenopathy noted  Axillary Nodes:  No palpable lymphadenopathy Psych:  Cognition and judgment appear intact. Alert and cooperative with normal attention span and concentration. No apparent delusions, illusions, hallucinations

## 2015-09-16 NOTE — Assessment & Plan Note (Signed)
Well controlled on current rx. No changes made today. 

## 2015-09-16 NOTE — Assessment & Plan Note (Addendum)
The patients weight, height, BMI and visual acuity have been recorded in the chart.  Cognitive function assessed.   I have made referrals, counseling and provided education to the patient based review of the above and I have provided the pt with a written personalized care plan for preventive services.  Orders Placed This Encounter  Procedures  . CBC with Differential/Platelet  . Comprehensive metabolic panel  . Lipid panel  . TSH  . Hepatitis C Antibody

## 2015-09-17 ENCOUNTER — Encounter: Payer: Self-pay | Admitting: *Deleted

## 2015-09-17 LAB — HEPATITIS C ANTIBODY: HCV Ab: NEGATIVE

## 2015-09-26 ENCOUNTER — Encounter: Payer: Self-pay | Admitting: Primary Care

## 2015-09-26 ENCOUNTER — Other Ambulatory Visit: Payer: Self-pay | Admitting: Family Medicine

## 2015-09-26 ENCOUNTER — Ambulatory Visit (INDEPENDENT_AMBULATORY_CARE_PROVIDER_SITE_OTHER): Payer: PPO | Admitting: Primary Care

## 2015-09-26 VITALS — BP 144/84 | HR 65 | Temp 98.0°F | Ht 62.0 in | Wt 143.4 lb

## 2015-09-26 DIAGNOSIS — R110 Nausea: Secondary | ICD-10-CM

## 2015-09-26 MED ORDER — ONDANSETRON 4 MG PO TBDP: 4.0000 mg | ORAL_TABLET | Freq: Three times a day (TID) | ORAL | Status: DC | PRN

## 2015-09-26 NOTE — Progress Notes (Signed)
Pre visit review using our clinic review tool, if applicable. No additional management support is needed unless otherwise documented below in the visit note. 

## 2015-09-26 NOTE — Progress Notes (Signed)
Subjective:    Patient ID: Hannah Vasquez, female    DOB: 05/16/1951, 65 y.o.   MRN: [id number]  HPI  Hannah Vasquez is a 65 year old female who presents today with a chief complaint of abdominal pain. She describes her pain as cramping that is located to the generlized abdomen and has been present since 3 am on Thursday morning. She also reports nausea, diarrhea, fatigue, and headache.   She realized that she ate some pimento cheese on Wednesday night that had been recalled. She was recently treated (and completed Sunday) with a 10 day course of antibiotics for a recently dental procedure. Overall her diarrhea has improved but she continues to feel nauseated and fatigued.  Her last episode of diarrhea was this morning. She's eaten scrambled eggs and drank some Coke today without diarrhea or vomiting.   Review of Systems  Constitutional: Positive for fatigue. Negative for fever and chills.  Gastrointestinal: Positive for nausea, abdominal pain and diarrhea. Negative for vomiting.  Neurological: Negative for weakness.       Past Medical History  Diagnosis Date  . Mitral valve prolapse   . Hemorrhoids   . CAD (coronary artery disease)   . Allergy   . Anxiety   . Osteoporosis   . Hypertension     Social History   Social History  . Marital Status: Divorced    Spouse Name: N/A  . Number of Children: 2  . Years of Education: N/A   Occupational History  . Medical office---front desk     Retired   Social History Main Topics  . Smoking status: Never Smoker   . Smokeless tobacco: Not on file  . Alcohol Use: Not on file  . Drug Use: Not on file  . Sexual Activity: Not on file   Other Topics Concern  . Not on file   Social History Narrative   Has a living will.   HPOA- daughter.   She would desire CPR.  Would desire life support if necessary and not futile.          Past Surgical History  Procedure Laterality Date  . Knee surgery  2003    torn meniscus  . Bladder surgery   11/15/1995    bladder neck suspension, urinary incontinence    Family History  Problem Relation Age of Onset  . Mitral valve prolapse Mother   . Hypertension Father     Allergies  Allergen Reactions  . Codeine     REACTION: nausea and vomiting  . Penicillins     REACTION: rash    Current Outpatient Prescriptions on File Prior to Visit  Medication Sig Dispense Refill  . aspirin EC 81 MG tablet Take 81 mg by mouth every morning.     . Calcium Carbonate-Vit D-Min 600-400 MG-UNIT TABS Take 1 tablet by mouth every morning.     . Cholecalciferol (VITAMIN D3) 1000 UNITS CAPS Take 1 capsule by mouth every morning.     Marland Kitchen desonide (DESOWEN) 0.05 % cream Apply topically 2 (two) times daily.    . fluocinonide-emollient (LIDEX-E) 0.05 % cream Apply 1 application topically daily as needed (to legs for rash). 30 g 2  . fluticasone (FLONASE) 50 MCG/ACT nasal spray Place 2 sprays into both nostrils as needed.    . hydrochlorothiazide (HYDRODIURIL) 25 MG tablet TAKE ONE-HALF TABLET BY MOUTH EVERY MORNING 45 tablet 1  . ketoconazole (NIZORAL) 2 % cream Apply 1 application topically daily.    Marland Kitchen loratadine (CLARITIN)  10 MG tablet Take 1 tablet (10 mg total) by mouth every morning. 30 tablet 2  . metoprolol succinate (TOPROL-XL) 100 MG 24 hr tablet Take 1 tablet (100 mg total) by mouth daily. Take with or immediately following a meal. 30 tablet 10  . Multiple Vitamin (MULTIVITAMIN) tablet Take 1 tablet by mouth every morning.     . naproxen (NAPROSYN) 500 MG tablet Take 500 mg by mouth 2 (two) times daily with a meal.    . Omega-3 Fatty Acids (FISH OIL) 1200 MG CAPS Take 1 capsule by mouth every morning.      No current facility-administered medications on file prior to visit.    BP 144/84 mmHg  Pulse 65  Temp(Src) 98 F (36.7 C) (Oral)  Ht 5' 2"  (1.575 m)  Wt 143 lb 6.4 oz (65.046 kg)  BMI 26.22 kg/m2  SpO2 99%    Objective:   Physical Exam  Constitutional: She appears well-nourished.  She does not appear ill.  HENT:  Right Ear: Tympanic membrane and ear canal normal.  Left Ear: Tympanic membrane and ear canal normal.  Nose: Right sinus exhibits no maxillary sinus tenderness and no frontal sinus tenderness. Left sinus exhibits no maxillary sinus tenderness and no frontal sinus tenderness.  Mouth/Throat: Oropharynx is clear and moist.  Neck: Neck supple.  Cardiovascular: Normal rate and regular rhythm.   Pulmonary/Chest: Effort normal and breath sounds normal.  Abdominal: Soft. Bowel sounds are normal.  Generalized discomfort, no clear place for tenderness.  Skin: Skin is warm and dry.          Assessment & Plan:  Diarrhea:  Also with abdominal cramping, fatigue, and nausea since 3 am Thursday morning. She ate Pimento cheese on Wednesday night that has since been recalled. Overall diarrhea improved. She's tolerating liquids and solids. Exam unremarkable which is reassuring, does not appear ill.  Will treat with supportive measures. Clear liquid diet, Zofran PRN, advanced diet slowly with bland foods. Return precautions provided.

## 2015-09-26 NOTE — Patient Instructions (Signed)
You may take the ondansetron medication as needed for nausea. Place one tablet in your mouth every 8 hours as needed for nausea.  Slowly advance your diet and increase consumption of water to stay hydrated.  Take a look at the information below regarding a bland diet.  Please call if you develop fevers >101, persistent vomiting, unable to keep fluids or solids today, bloody diarrhea.  It was a pleasure meeting you!  Food Choices to Help Relieve Diarrhea, Adult When you have diarrhea, the foods you eat and your eating habits are very important. Choosing the right foods and drinks can help relieve diarrhea. Also, because diarrhea can last up to 7 days, you need to replace lost fluids and electrolytes (such as sodium, potassium, and chloride) in order to help prevent dehydration.  WHAT GENERAL GUIDELINES DO I NEED TO FOLLOW?  Slowly drink 1 cup (8 oz) of fluid for each episode of diarrhea. If you are getting enough fluid, your urine will be clear or pale yellow.  Eat starchy foods. Some good choices include white rice, white toast, pasta, low-fiber cereal, baked potatoes (without the skin), saltine crackers, and bagels.  Avoid large servings of any cooked vegetables.  Limit fruit to two servings per day. A serving is  cup or 1 small piece.  Choose foods with less than 2 g of fiber per serving.  Limit fats to less than 8 tsp (38 g) per day.  Avoid fried foods.  Eat foods that have probiotics in them. Probiotics can be found in certain dairy products.  Avoid foods and beverages that may increase the speed at which food moves through the stomach and intestines (gastrointestinal tract). Things to avoid include:  High-fiber foods, such as dried fruit, raw fruits and vegetables, nuts, seeds, and whole grain foods.  Spicy foods and high-fat foods.  Foods and beverages sweetened with high-fructose corn syrup, honey, or sugar alcohols such as xylitol, sorbitol, and mannitol. WHAT FOODS ARE  RECOMMENDED? Grains White rice. White, [place], or pita breads (fresh or toasted), including plain rolls, buns, or bagels. White pasta. Saltine, soda, or graham crackers. Pretzels. Low-fiber cereal. Cooked cereals made with water (such as cornmeal, farina, or cream cereals). Plain muffins. Matzo. Melba toast. Zwieback.  Vegetables Potatoes (without the skin). Strained tomato and vegetable juices. Most well-cooked and canned vegetables without seeds. Tender lettuce. Fruits Cooked or canned applesauce, apricots, cherries, fruit cocktail, grapefruit, peaches, pears, or plums. Fresh bananas, apples without skin, cherries, grapes, cantaloupe, grapefruit, peaches, oranges, or plums.  Meat and Other Protein Products Baked or boiled chicken. Eggs. Tofu. Fish. Seafood. Smooth peanut butter. Ground or well-cooked tender beef, ham, veal, lamb, pork, or poultry.  Dairy Plain yogurt, kefir, and unsweetened liquid yogurt. Lactose-free milk, buttermilk, or soy milk. Plain hard cheese. Beverages Sport drinks. Clear broths. Diluted fruit juices (except prune). Regular, caffeine-free sodas such as ginger ale. Water. Decaffeinated teas. Oral rehydration solutions. Sugar-free beverages not sweetened with sugar alcohols. Other Bouillon, broth, or soups made from recommended foods.  The items listed above may not be a complete list of recommended foods or beverages. Contact your dietitian for more options. WHAT FOODS ARE NOT RECOMMENDED? Grains Whole grain, whole wheat, bran, or rye breads, rolls, pastas, crackers, and cereals. Wild or brown rice. Cereals that contain more than 2 g of fiber per serving. Corn tortillas or taco shells. Cooked or dry oatmeal. Granola. Popcorn. Vegetables Raw vegetables. Cabbage, broccoli, Brussels sprouts, artichokes, baked beans, beet greens, corn, kale, legumes, peas, sweet potatoes,  and yams. Potato skins. Cooked spinach and cabbage. Fruits Dried fruit, including raisins and dates.  Raw fruits. Stewed or dried prunes. Fresh apples with skin, apricots, mangoes, pears, raspberries, and strawberries.  Meat and Other Protein Products Chunky peanut butter. Nuts and seeds. Beans and lentils. Berniece Salines.  Dairy High-fat cheeses. Milk, chocolate milk, and beverages made with milk, such as milk shakes. Cream. Ice cream. Sweets and Desserts Sweet rolls, doughnuts, and sweet breads. Pancakes and waffles. Fats and Oils Butter. Cream sauces. Margarine. Salad oils. Plain salad dressings. Olives. Avocados.  Beverages Caffeinated beverages (such as coffee, tea, soda, or energy drinks). Alcoholic beverages. Fruit juices with pulp. Prune juice. Soft drinks sweetened with high-fructose corn syrup or sugar alcohols. Other Coconut. Hot sauce. Chili powder. Mayonnaise. Gravy. Cream-based or milk-based soups.  The items listed above may not be a complete list of foods and beverages to avoid. Contact your dietitian for more information. WHAT SHOULD I DO IF I BECOME DEHYDRATED? Diarrhea can sometimes lead to dehydration. Signs of dehydration include dark urine and dry mouth and skin. If you think you are dehydrated, you should rehydrate with an oral rehydration solution. These solutions can be purchased at pharmacies, retail stores, or online.  Drink -1 cup (120-240 mL) of oral rehydration solution each time you have an episode of diarrhea. If drinking this amount makes your diarrhea worse, try drinking smaller amounts more often. For example, drink 1-3 tsp (5-15 mL) every 5-10 minutes.  A general rule for staying hydrated is to drink 1-2 L of fluid per day. Talk to your health care provider about the specific amount you should be drinking each day. Drink enough fluids to keep your urine clear or pale yellow.   This information is not intended to replace advice given to you by your health care provider. Make sure you discuss any questions you have with your health care provider.   Document Released:  10/23/2003 Document Revised: 08/23/2014 Document Reviewed: 06/25/2013 Elsevier Interactive Patient Education [employer].

## 2015-10-16 ENCOUNTER — Other Ambulatory Visit: Payer: Self-pay | Admitting: Physical Medicine and Rehabilitation

## 2015-10-16 DIAGNOSIS — M545 Low back pain: Secondary | ICD-10-CM

## 2015-10-16 DIAGNOSIS — M5416 Radiculopathy, lumbar region: Secondary | ICD-10-CM | POA: Diagnosis not present

## 2015-10-21 DIAGNOSIS — H9313 Tinnitus, bilateral: Secondary | ICD-10-CM | POA: Diagnosis not present

## 2015-10-21 DIAGNOSIS — J387 Other diseases of larynx: Secondary | ICD-10-CM | POA: Diagnosis not present

## 2015-10-21 DIAGNOSIS — H903 Sensorineural hearing loss, bilateral: Secondary | ICD-10-CM | POA: Diagnosis not present

## 2015-10-25 ENCOUNTER — Ambulatory Visit
Admission: RE | Admit: 2015-10-25 | Discharge: 2015-10-25 | Disposition: A | Discharge status: Home / Self Care | Payer: PPO | Source: Ambulatory Visit | Attending: Physical Medicine and Rehabilitation | Admitting: Physical Medicine and Rehabilitation

## 2015-10-25 DIAGNOSIS — M5136 Other intervertebral disc degeneration, lumbar region: Secondary | ICD-10-CM | POA: Diagnosis not present

## 2015-10-25 DIAGNOSIS — M545 Low back pain: Secondary | ICD-10-CM

## 2015-11-04 DIAGNOSIS — M545 Low back pain: Secondary | ICD-10-CM | POA: Diagnosis not present

## 2015-11-04 DIAGNOSIS — G609 Hereditary and idiopathic neuropathy, unspecified: Secondary | ICD-10-CM | POA: Diagnosis not present

## 2015-11-04 DIAGNOSIS — M5416 Radiculopathy, lumbar region: Secondary | ICD-10-CM | POA: Diagnosis not present

## 2015-11-25 ENCOUNTER — Encounter: Payer: Medicare HMO | Admitting: Family Medicine

## 2015-12-15 ENCOUNTER — Other Ambulatory Visit: Payer: Self-pay | Admitting: Family Medicine

## 2015-12-17 DIAGNOSIS — D1801 Hemangioma of skin and subcutaneous tissue: Secondary | ICD-10-CM | POA: Diagnosis not present

## 2015-12-17 DIAGNOSIS — C44319 Basal cell carcinoma of skin of other parts of face: Secondary | ICD-10-CM | POA: Diagnosis not present

## 2015-12-17 DIAGNOSIS — D235 Other benign neoplasm of skin of trunk: Secondary | ICD-10-CM | POA: Diagnosis not present

## 2015-12-17 DIAGNOSIS — L720 Epidermal cyst: Secondary | ICD-10-CM | POA: Diagnosis not present

## 2016-01-05 DIAGNOSIS — C44311 Basal cell carcinoma of skin of nose: Secondary | ICD-10-CM | POA: Diagnosis not present

## 2016-01-07 ENCOUNTER — Telehealth: Payer: Self-pay | Admitting: *Deleted

## 2016-01-07 DIAGNOSIS — E2839 Other primary ovarian failure: Secondary | ICD-10-CM

## 2016-01-07 NOTE — Telephone Encounter (Signed)
Patient received a call from [facility] that she was due for her next Bone Density Scan.  Her last one was done 11/29/13.  She will call back to schedule if needed.  Please advise.

## 2016-01-13 NOTE — Telephone Encounter (Signed)
Order placed

## 2016-02-05 DIAGNOSIS — C44319 Basal cell carcinoma of skin of other parts of face: Secondary | ICD-10-CM | POA: Diagnosis not present

## 2016-02-05 DIAGNOSIS — Z85828 Personal history of other malignant neoplasm of skin: Secondary | ICD-10-CM | POA: Diagnosis not present

## 2016-02-05 HISTORY — PX: BASAL CELL CARCINOMA EXCISION: SHX1214

## 2016-02-11 ENCOUNTER — Other Ambulatory Visit: Payer: Self-pay | Admitting: Family Medicine

## 2016-03-17 ENCOUNTER — Encounter: Payer: Self-pay | Admitting: Family Medicine

## 2016-03-17 ENCOUNTER — Telehealth: Payer: Self-pay | Admitting: Family Medicine

## 2016-03-17 ENCOUNTER — Ambulatory Visit (INDEPENDENT_AMBULATORY_CARE_PROVIDER_SITE_OTHER): Payer: PPO | Admitting: Family Medicine

## 2016-03-17 VITALS — BP 177/88 | HR 56 | Temp 98.0°F | Ht 62.0 in | Wt 155.0 lb

## 2016-03-17 DIAGNOSIS — H8110 Benign paroxysmal vertigo, unspecified ear: Secondary | ICD-10-CM | POA: Diagnosis not present

## 2016-03-17 MED ORDER — MECLIZINE HCL 25 MG PO TABS: 25.0000 mg | ORAL_TABLET | ORAL | 0 refills | Status: DC | PRN

## 2016-03-17 NOTE — Telephone Encounter (Signed)
Patient Name: Hannah Vasquez DOB: 06/07/1951 Initial Comment Has been light headed for the last week. Nurse Assessment Nurse: Vallery Sa, RN, Cathy Date/Time (Eastern Time): 03/17/2016 9:27:22 AM Confirm and document reason for call. If symptomatic, describe symptoms. You must click the next button to save text entered. ---[name] states she has had dizziness on and off the past week that became Vertigo yesterday. No severe breathing difficulty. No chest pain. No fever. Alert and responsive. She developed pain with swallowing on and off several weeks ago that became worse recently. Has the patient traveled out of the country within the last 30 days? ---No Does the patient have any new or worsening symptoms? ---Yes Will a triage be completed? ---Yes Related visit to physician within the last 2 weeks? ---No Does the PT have any chronic conditions? (i.e. diabetes, asthma, etc.) ---Yes List chronic conditions. ---Mitral Valve Prolapse, High Blood Pressure, Basal Cell Carcinoma removed a few months ago Is this a behavioral health or substance abuse call? ---No Guidelines Guideline Title Affirmed Question Affirmed Notes Dizziness - Vertigo [1] Dizziness (vertigo) present now AND [2] one or more stroke risk factors (i.e., hypertension, diabetes, prior stroke/TIA/ heart attack) (Exception: prior physician evaluation for this AND no different/worse than usual) Final Disposition User Go to ED Now (or PCP triage) Vallery Sa, RN, Tye [place] Comments Scheduled for 11am appointment today with Dr. Sarajane Jews. Referrals REFERRED TO PCP OFFICE Disagree/Comply: Comply

## 2016-03-17 NOTE — Progress Notes (Signed)
   Subjective:    Patient ID: Hannah Vasquez, female    DOB: [birth year], 65 y.o.   MRN: [id number]  HPI Here for 2 days of intermittent dizziness that makes it seem like the room is spinning around. She has had some sinus pressure as well but not a headache or ST or cough or fever. She takes Claritin daily. No nausea or vomiting.    Review of Systems  Constitutional: Negative.   HENT: Positive for congestion and sinus pressure. Negative for ear pain, postnasal drip, sore throat and tinnitus.   Eyes: Negative.   Respiratory: Negative.   Cardiovascular: Negative.   Neurological: Positive for dizziness. Negative for tremors, seizures, syncope, facial asymmetry, speech difficulty, weakness, light-headedness, numbness and headaches.       Objective:   Physical Exam  Constitutional: She is oriented to person, place, and time. She appears well-developed and well-nourished. No distress.  HENT:  Right Ear: External ear normal.  Left Ear: External ear normal.  Nose: Nose normal.  Mouth/Throat: Oropharynx is clear and moist.  Eyes: Conjunctivae and EOM are normal. Pupils are equal, round, and reactive to light.  Neck: No thyromegaly present.  Cardiovascular: Normal rate, regular rhythm, normal heart sounds and intact distal pulses.   Pulmonary/Chest: Effort normal and breath sounds normal.  Lymphadenopathy:    She has no cervical adenopathy.  Neurological: She is alert and oriented to person, place, and time. No cranial nerve deficit. Coordination normal.          Assessment & Plan:  Vertigo. She will drink plenty of fluids and use Meclizine prn.  Laurey Morale, MD

## 2016-03-17 NOTE — Telephone Encounter (Signed)
Pt has appt 03/17/16 with Dr Sarajane Jews

## 2016-03-17 NOTE — Progress Notes (Signed)
Pre visit review using our clinic review tool, if applicable. No additional management support is needed unless otherwise documented below in the visit note. 

## 2016-03-22 ENCOUNTER — Encounter: Payer: Self-pay | Admitting: Family Medicine

## 2016-03-22 ENCOUNTER — Ambulatory Visit (INDEPENDENT_AMBULATORY_CARE_PROVIDER_SITE_OTHER): Payer: PPO | Admitting: Family Medicine

## 2016-03-22 DIAGNOSIS — H8111 Benign paroxysmal vertigo, right ear: Secondary | ICD-10-CM

## 2016-03-22 DIAGNOSIS — H8110 Benign paroxysmal vertigo, unspecified ear: Secondary | ICD-10-CM | POA: Insufficient documentation

## 2016-03-22 DIAGNOSIS — R42 Dizziness and giddiness: Secondary | ICD-10-CM | POA: Diagnosis not present

## 2016-03-22 MED ORDER — DEXAMETHASONE SODIUM PHOSPHATE 10 MG/ML IJ SOLN
10.0000 mg | Freq: Once | INTRAMUSCULAR | Status: AC
  Administered 2016-03-22: 10 mg via INTRAMUSCULAR

## 2016-03-22 NOTE — Assessment & Plan Note (Signed)
Likely secondary to fluid behind right TM IM decadron given in office to help with inflammation of canal. Refer to vestibular rehab. Call or return to clinic prn if these symptoms worsen or fail to improve as anticipated. The patient indicates understanding of these issues and agrees with the plan.

## 2016-03-22 NOTE — Progress Notes (Signed)
Subjective:   Patient ID: Hannah Vasquez, female    DOB: 1951/06/28, 65 y.o.   MRN: [id number]  Hannah Vasquez is a pleasant 65 y.o. year old female who presents to clinic today with Follow-up  on 03/22/2016  HPI:  Saw Dr. Sarajane Jews on 03/17/16 for dizziness.  Note reviewed.  At that [place], she complained of intermittent dizziness/sensation of room spinning x 2 days.  No nausea or vomiting.  Exam unremarkable. Advised to push fluids, meclizine prn.  Feels a little better but still very symptomatic- dizziness worse with changes in head positions.  Right ear is hurting as well.  Feels unsteady when she walks.  Current Outpatient Prescriptions on File Prior to Visit  Medication Sig Dispense Refill  . aspirin EC 81 MG tablet Take 81 mg by mouth every morning.     . Calcium Carbonate-Vit D-Min 600-400 MG-UNIT TABS Take 1 tablet by mouth every morning.     . Cholecalciferol (VITAMIN D3) 1000 UNITS CAPS Take 1 capsule by mouth every morning.     Marland Kitchen desonide (DESOWEN) 0.05 % cream Apply topically 2 (two) times daily.    . fluocinonide-emollient (LIDEX-E) 0.05 % cream Apply 1 application topically daily as needed (to legs for rash). 30 g 2  . fluticasone (FLONASE) 50 MCG/ACT nasal spray Place 2 sprays into both nostrils as needed.    . hydrochlorothiazide (HYDRODIURIL) 12.5 MG tablet TAKE ONE TABLET BY MOUTH EVERY MORNING 90 tablet 1  . ketoconazole (NIZORAL) 2 % cream Apply 1 application topically daily.    Marland Kitchen loratadine (CLARITIN) 10 MG tablet Take 1 tablet (10 mg total) by mouth every morning. 30 tablet 2  . meclizine (ANTIVERT) 25 MG tablet Take 1 tablet (25 mg total) by mouth every 4 (four) hours as needed for dizziness. 60 tablet 0  . metoprolol succinate (TOPROL-XL) 100 MG 24 hr tablet TAKE 1 TABLET BY MOUTH DAILY. TAKE WITH OR IMMEDIATELY FOLLOWING A MEAL 30 tablet 5  . Multiple Vitamin (MULTIVITAMIN) tablet Take 1 tablet by mouth every morning.     . naproxen (NAPROSYN) 500 MG tablet Take 500  mg by mouth 2 (two) times daily with a meal.    . Omega-3 Fatty Acids (FISH OIL) 1200 MG CAPS Take 1 capsule by mouth every morning.     . ondansetron (ZOFRAN ODT) 4 MG disintegrating tablet Take 1 tablet (4 mg total) by mouth every 8 (eight) hours as needed for nausea or vomiting. 20 tablet 0  . PROAIR HFA 108 (90 Base) MCG/ACT inhaler INHALE 2 PUFFS INTO THE LUNGS EVERY 4 HOURS AS NEEDED 8.5 g 5   No current facility-administered medications on file prior to visit.     Allergies  Allergen Reactions  . Codeine     REACTION: nausea and vomiting  . Penicillins     REACTION: rash    Past Medical History:  Diagnosis Date  . Allergy   . Anxiety   . CAD (coronary artery disease)   . Hemorrhoids   . Hypertension   . Mitral valve prolapse   . Osteoporosis     Past Surgical History:  Procedure Laterality Date  . BLADDER SURGERY  11/15/1995   bladder neck suspension, urinary incontinence  . KNEE SURGERY  2003   torn meniscus    Family History  Problem Relation Age of Onset  . Mitral valve prolapse Mother   . Hypertension Father     Social History   Social History  . Marital status: Divorced  Spouse name: N/A  . Number of children: 2  . Years of education: N/A   Occupational History  . Medical office---front desk     Retired   Social History Main Topics  . Smoking status: Never Smoker  . Smokeless tobacco: Not on file  . Alcohol use Not on file  . Drug use: Unknown  . Sexual activity: Not on file   Other Topics Concern  . Not on file   Social History Narrative   Has a living will.   HPOA- daughter.   She would desire CPR.  Would desire life support if necessary and not futile.         The PMH, PSH, Social History, Family History, Medications, and allergies have been reviewed in [facility], and have been updated if relevant.    Review of Systems  HENT: Positive for ear pain. Negative for tinnitus.   Gastrointestinal: Negative for nausea.  Neurological:  Positive for dizziness. Negative for tremors, syncope, facial asymmetry, speech difficulty, weakness, light-headedness, numbness and headaches.  All other systems reviewed and are negative.      Objective:    BP 136/82   Pulse 63   Temp 98.7 F (37.1 C) (Oral)   Wt 154 lb 12 oz (70.2 kg)   SpO2 98%   BMI 28.30 kg/m    Physical Exam  Constitutional: She is oriented to person, place, and time. She appears well-developed and well-nourished. No distress.  HENT:  Head: Normocephalic.  Right Ear: A middle ear effusion is present.  Left Ear:  No middle ear effusion.  Eyes:  + dix hallpike With nystagmus  Cardiovascular: Normal rate.   Pulmonary/Chest: Effort normal.  Musculoskeletal: Normal range of motion.  Neurological: She is alert and oriented to person, place, and time. No cranial nerve deficit.  Skin: Skin is warm. She is not diaphoretic.  Psychiatric: She has a normal mood and affect. Her behavior is normal. Judgment and thought content normal.  Nursing note and vitals reviewed.         Assessment & Plan:   Dizziness and giddiness No Follow-up on file.

## 2016-03-22 NOTE — Progress Notes (Signed)
Pre visit review using our clinic review tool, if applicable. No additional management support is needed unless otherwise documented below in the visit note. 

## 2016-03-22 NOTE — Patient Instructions (Signed)
Good to see you. Please stop by to see Rosaria Ferries on your way out.

## 2016-03-22 NOTE — Addendum Note (Signed)
Addended by: Modena Nunnery on: 03/22/2016 12:20 PM   Modules accepted: Orders

## 2016-03-23 DIAGNOSIS — H2512 Age-related nuclear cataract, left eye: Secondary | ICD-10-CM | POA: Diagnosis not present

## 2016-03-23 DIAGNOSIS — H2511 Age-related nuclear cataract, right eye: Secondary | ICD-10-CM | POA: Diagnosis not present

## 2016-03-23 DIAGNOSIS — H02839 Dermatochalasis of unspecified eye, unspecified eyelid: Secondary | ICD-10-CM | POA: Diagnosis not present

## 2016-03-23 DIAGNOSIS — H18411 Arcus senilis, right eye: Secondary | ICD-10-CM | POA: Diagnosis not present

## 2016-03-23 DIAGNOSIS — H2513 Age-related nuclear cataract, bilateral: Secondary | ICD-10-CM | POA: Diagnosis not present

## 2016-03-29 ENCOUNTER — Ambulatory Visit: Payer: PPO | Attending: Family Medicine | Admitting: Physical Therapy

## 2016-03-29 ENCOUNTER — Encounter: Payer: Self-pay | Admitting: Physical Therapy

## 2016-03-29 ENCOUNTER — Telehealth: Payer: Self-pay | Admitting: Family Medicine

## 2016-03-29 DIAGNOSIS — H8111 Benign paroxysmal vertigo, right ear: Secondary | ICD-10-CM | POA: Insufficient documentation

## 2016-03-29 DIAGNOSIS — R42 Dizziness and giddiness: Secondary | ICD-10-CM | POA: Diagnosis not present

## 2016-03-29 MED ORDER — ONDANSETRON HCL 4 MG PO TABS: 4.0000 mg | ORAL_TABLET | Freq: Three times a day (TID) | ORAL | 0 refills | Status: DC | PRN

## 2016-03-29 NOTE — Patient Instructions (Signed)
Tip Card 1.The goal of habituation training is to assist in decreasing symptoms of vertigo, dizziness, or nausea provoked by specific head and body motions. 2.These exercises may initially increase symptoms; however, be persistent and work through symptoms. With repetition and time, the exercises will assist in reducing or eliminating symptoms. 3.Exercises should be stopped and discussed with the therapist if you experience any of the following: - Sudden change or fluctuation in hearing - New onset of ringing in the ears, or increase in current intensity - Any fluid discharge from the ear - Severe pain in neck or back - Extreme nausea  Copyright  VHI. All rights reserved.  Rolling   With pillow under head, start on back. Roll to your right side.  Hold until dizziness stops, plus 20 seconds and then roll to the left side.  Hold until dizziness stops, plus 20 seconds.  Repeat sequence 5 times per session. Do 2 sessions per day.  Copyright  VHI. All rights reserved.

## 2016-03-29 NOTE — Telephone Encounter (Signed)
Pt called to request a refill on her Zofran.  Can you please call her or refill as appropriate.  Thanks!

## 2016-03-29 NOTE — Therapy (Signed)
[facility] [address] [place], [place], [address] Phone: [phone]   Fax:  [phone]  Physical Therapy Evaluation  Patient Details  Name: ATIYAH BAUER MRN: [id number] Date of Birth: 1951/03/26 Referring Provider: Arnette Norris, MD  Encounter Date: 03/29/2016      PT End of Session - 03/29/16 1245    Visit Number 1   Number of Visits 7  eval + 6 visits   Date for PT Re-Evaluation 04/28/16   Authorization Type [employer]   Authorization Time Period G Codes required   PT Start Time 1148   PT Stop Time 1232   PT Time Calculation (min) 44 min   Activity Tolerance Other (comment)  Patient limited by nausea   Behavior During Therapy [facility] for tasks assessed/performed      Past Medical History:  Diagnosis Date  . Allergy   . Anxiety   . CAD (coronary artery disease)   . Hemorrhoids   . Hypertension   . Mitral valve prolapse   . Osteoporosis     Past Surgical History:  Procedure Laterality Date  . BLADDER SURGERY  11/15/1995   bladder neck suspension, urinary incontinence  . KNEE SURGERY  2003   torn meniscus  . MOHS SURGERY Right 2017   right side of face  . SHOULDER SURGERY Right 2005    There were no vitals filed for this visit.       Subjective Assessment - 03/29/16 1156    Subjective Vertigo started approx 2 weeks ago when pt was out shopping. Nausea and dizziness worse with head movement (to R > L), with sit > stand, and with getting OOB.    Pertinent History PMH significant for: CAD, mitral valve prolapse, HTN, osteopenia, RBBB, L cataract surgery scheduled for 04/2014   Patient Stated Goals "To get rid of the dizziness and nausea."   Currently in Pain? No/denies  No pain, but a throbbing sensation in head            [facility] PT Assessment - 03/29/16 0001      Assessment   Medical Diagnosis Benign Paroxysmal Positional Vertigo, Right   Referring Provider Arnette Norris, MD   Onset  Date/Surgical Date 03/16/16     Precautions   Precautions None     Restrictions   Weight Bearing Restrictions No     Balance Screen   Has the patient fallen in the past 6 months Yes   How many times? 2   Has the patient had a decrease in activity level because of a fear of falling?  Yes   Is the patient reluctant to leave their home because of a fear of falling?  No     [employer] residence   Living Arrangements Other (Comment)  lives with daughter and grandaughter   Type of [place] Access Level entry   [place] None   Additional Comments Has 1 small step with 2 rails to enter kitchen.            Vestibular Assessment - 03/29/16 0001      Vestibular Assessment   General Observation No meclizine today.      Symptom Behavior   Type of Dizziness "Funny feeling in head"   Frequency of Dizziness all the time   Duration of Dizziness 3-5 minutes   Aggravating Factors Activity in general;Supine to sit;Sit to stand;Turning body quickly;Turning head  quickly   Relieving Factors Lying supine;Head stationary     Occulomotor Exam   Occulomotor Alignment Normal   Spontaneous Absent   Gaze-induced Absent   Smooth Pursuits Intact   Saccades Intact   Comment (+) and symptomatic R Head Thrust Test     Vestibulo-Occular Reflex   VOR 1 Head Only (x 1 viewing) Increased dizziness, nausea with horizontal x1 viewing.   VOR Cancellation Normal   Comment Convergence appears WNL.     Positional Testing   Sidelying Test Sidelying Right;Sidelying Left   Horizontal Canal Testing Horizontal Canal Right;Horizontal Canal Left;Horizontal Canal Right Intensity;Horizontal Canal Left Intensity     Sidelying Right   Sidelying Right Duration "Sick on the stomach," per patient; but no nystagmus visible   Sidelying Right Symptoms No nystagmus     Sidelying Left   Sidelying Left Duration < 5 seconds of symptoms; no  nystagmus visible   Sidelying Left Symptoms No nystagmus     Horizontal Canal Right   Horizontal Canal Right Duration <10 seconds    Horizontal Canal Right Symptoms Geotrophic;Nystagmus     Horizontal Canal Left   Horizontal Canal Left Duration NA   Horizontal Canal Left Symptoms Normal     Horizontal Canal Right Intensity   Horizontal Canal Right Intensity Moderate     Horizontal Canal Left Intensity   Left Intensity Comment NA                Vestibular Treatment/Exercise - 03/29/16 0001      Vestibular Treatment/Exercise   Vestibular Treatment Provided Canalith Repositioning   Canalith Repositioning Canal Roll Right     Canal Roll Right   Number of Reps  1   Overall Response  Improved Symptoms   Response Details  Reassessment reveals trace nystagmus (x3 beats) on R side accompanied by nausea.               PT Education - 03/29/16 1239    Education provided Yes   Education Details PT eval findings, goals, and POC. Explained BPPV and what to expect after this session. Initiated habituation HEP; see Pt Instructions for details.    Person(s) Educated Patient   Methods Explanation;Demonstration;Handout   Comprehension Verbalized understanding          PT Short Term Goals - 03/29/16 1251      PT SHORT TERM GOAL #1   Title STG's = LTG's           PT Long Term Goals - 03/29/16 1251      PT LONG TERM GOAL #1   Title Positional vertigo testing will be negative to indicate resolved BPPV.   (Target: 04/26/16)     PT LONG TERM GOAL #2   Title Pt will demonstrate independence with habituation HEP to maximize functional gains made in PT.   (04/26/16)     PT LONG TERM GOAL #3   Title Pt will decrease DHI score from 76 to < / = 58 to indicate significant decrease in pt-perceived disability due to dizziness.  (04/26/16)     PT LONG TERM GOAL #4   Title Assess strength and balance, if indicated, after vertigo clears.  (04/26/16)               Plan  - 03/29/16 1247    Clinical Impression Statement Pt is a 65 y/o F referred to vestibular PT to address vertigo. PMH significant for: CAD, mitral valve prolapse, HTN, osteopenia, RBBB. PT evaluation reveals the following: (+)  and symptomatic R Head Thrust Test; horizontal canal testing with geotropic nystagmus on R accompanied by concordant dizziness. Unable to rule out R horizontal canalithiasis and vestibular hypofunction. Pt will benefit from skilled outpatient PT 2x/week for 2 weeks followed by 1x/week for 2 weeks to address vertigo.    Rehab Potential Good   PT Frequency Other (comment)  2x/week for 2 weeks, then 1x/week for 2 weeks   PT Duration Other (comment)  See above.   PT Treatment/Interventions ADLs/Self Care Home Management;Canalith Repostioning;Vestibular;Gait training;Stair training;Functional mobility training;Therapeutic activities;Patient/family education;Neuromuscular re-education;Therapeutic exercise;Balance training   PT Next Visit Plan Reassess for BPPV (R HC) and treat prn. Expand on vestibular HEP (x1 viewing) prn.   PT Home Exercise Plan rolling for HC habituation   Consulted and Agree with Plan of Care Patient      Patient will benefit from skilled therapeutic intervention in order to improve the following deficits and impairments:  Dizziness  Visit Diagnosis: BPPV (benign paroxysmal positional vertigo), right - Plan: PT plan of care cert/re-cert  Dizziness and giddiness - Plan: PT plan of care cert/re-cert      G-Codes - 82/50/03 1254    Functional Assessment Tool Used DHI = 76   Functional Limitation Self care   Self Care Current Status (B0488) At least 60 percent but less than 80 percent impaired, limited or restricted   Self Care Goal Status (Q9169) At least 40 percent but less than 60 percent impaired, limited or restricted       Problem List Patient Active Problem List   Diagnosis Date Noted  . Dizziness and giddiness 03/22/2016  . Benign paroxysmal  positional vertigo 03/22/2016  . Medicare annual wellness visit, initial 09/16/2015  . Abnormal hearing screen 09/16/2015  . Fatigue 11/27/2014  . Allergic reaction 06/26/2014  . RBBB 12/19/2013  . Mitral valve prolapse   . HYPERTENSION, BENIGN 08/27/2010  . UNSPEC SYMPTOM ASSOC W/FEMALE GENITAL ORGANS 08/27/2010  . Anxiety state 06/27/2009  . CAD 06/27/2009  . Mitral valve disorder 06/27/2009  . HEMORRHOIDS 06/27/2009  . ALLERGIC RHINITIS 06/27/2009  . OSTEOPENIA 11/11/2006    Billie Ruddy, PT, DPT [facility] [address] [place], [place], [address] Phone: [phone]   Fax:  [phone] 03/29/16, 12:57 PM  Name: LUWANA BUTRICK MRN: [id number] Date of Birth: Oct 19, 1950

## 2016-03-29 NOTE — Telephone Encounter (Signed)
eRx sent to [facility].

## 2016-04-05 ENCOUNTER — Ambulatory Visit: Payer: PPO | Admitting: Rehabilitative and Restorative Service Providers"

## 2016-04-05 ENCOUNTER — Ambulatory Visit: Payer: PPO | Admitting: Physical Therapy

## 2016-04-05 DIAGNOSIS — H8111 Benign paroxysmal vertigo, right ear: Secondary | ICD-10-CM

## 2016-04-05 DIAGNOSIS — R42 Dizziness and giddiness: Secondary | ICD-10-CM

## 2016-04-05 NOTE — Patient Instructions (Signed)
Tip Card 1.The goal of habituation training is to assist in decreasing symptoms of vertigo, dizziness, or nausea provoked by specific head and body motions. 2.These exercises may initially increase symptoms; however, be persistent and work through symptoms. With repetition and time, the exercises will assist in reducing or eliminating symptoms. 3.Exercises should be stopped and discussed with the therapist if you experience any of the following: - Sudden change or fluctuation in hearing - New onset of ringing in the ears, or increase in current intensity - Any fluid discharge from the ear - Severe pain in neck or back - Extreme nausea  Copyright  VHI. All rights reserved.  Rolling   With pillow under head, start on back. Roll to your right side.  Hold until dizziness stops, plus 20 seconds and then roll to the left side.  Hold until dizziness stops, plus 20 seconds.  Repeat sequence 5 times per session. Do 2 sessions per day.  Copyright  VHI. All rights reserved.  Sit to Side-Lying   Sit on edge of bed. Lie down onto the right side and hold until dizziness stops, plus 20 seconds.  Return to sitting and wait until dizziness stops, plus 20 seconds.  Repeat to the left side. Repeat sequence 5 times per session. Do 2 sessions per day.  Copyright  VHI. All rights reserved.  Gaze Stabilization: Tip Card 1.Target must remain in focus, not blurry, and appear stationary while head is in motion. 2.Perform exercises with small head movements (45 to either side of midline). 3.Increase speed of head motion so long as target is in focus. 4.If you wear eyeglasses, be sure you can see target through lens (therapist will give specific instructions for bifocal / progressive lenses). 5.These exercises may provoke dizziness or nausea. Work through these symptoms. If too dizzy, slow head movement slightly. Rest between each exercise. 6.Exercises demand concentration; avoid distractions. 7.For safety,  perform standing exercises close to a counter, wall, corner, or next to someone.  Copyright  VHI. All rights reserved.  Gaze Stabilization: Standing Feet Apart   Feet shoulder width apart, keeping eyes on target on wall 3 feet away, tilt head down slightly and move head side to side for 30 seconds. Repeat while moving head up and down for 30 seconds. Do 2 sessions per day.   Copyright  VHI. All rights reserved.  Feet Together (Compliant Surface) Varied Arm Positions - Eyes Closed    Stand on compliant surface: _pillow_______ with feet together and arms at your side. Close eyes and visualize upright position. Hold_30___ seconds. Repeat __3__ times per session. Do _1-2___ sessions per day.  Copyright  VHI. All rights reserved.   Feet Together (Compliant Surface) Head Motion - Eyes Open    With eyes open, standing on compliant surface: __pillow______, feet together, move head slowly: side to side. Repeat __10__ times per session. Do __1-2__ sessions per day.  Copyright  VHI. All rights reserved.

## 2016-04-05 NOTE — Therapy (Signed)
[facility] [address] [place], [place], [address] Phone: [phone]   Fax:  [phone]  Physical Therapy Treatment  Patient Details  Name: Hannah Vasquez MRN: [id number] Date of Birth: [birth year] Referring Provider: Arnette Norris, MD  Encounter Date: 04/05/2016      PT End of Session - 04/05/16 1934    Visit Number 2   Number of Visits 7  eval + 6 visits   Date for PT Re-Evaluation 04/28/16   Authorization Type [employer]   Authorization Time Period G Codes required   PT Start Time [phone]   PT Stop Time 1017   PT Time Calculation (min) 39 min   Activity Tolerance Patient tolerated treatment well   Behavior During Therapy [facility] for tasks assessed/performed      Past Medical History:  Diagnosis Date  . Allergy   . Anxiety   . CAD (coronary artery disease)   . Hemorrhoids   . Hypertension   . Mitral valve prolapse   . Osteoporosis     Past Surgical History:  Procedure Laterality Date  . BLADDER SURGERY  11/15/1995   bladder neck suspension, urinary incontinence  . KNEE SURGERY  2003   torn meniscus  . MOHS SURGERY Right 2017   right side of face  . SHOULDER SURGERY Right 2005    There were no vitals filed for this visit.      Subjective Assessment - 04/05/16 0940    Subjective The patient is scheduled for L cateracts surgery on 04/30/2016.  She feels overall dizziness is improved (less and less, but not gone).  She notices dizziness when standing up or moving her head.  She still notes minimal symptoms with rolling in bed.                  Vestibular Assessment - 04/05/16 0945      Vestibular Assessment   General Observation The patient did not take vertigo medication / meclizine today.       Positional Testing   Sidelying Test Sidelying Right;Sidelying Left   Horizontal Canal Testing Horizontal Canal Right;Horizontal Canal Left     Sidelying Right   Sidelying Right Duration nausea  with minimal geotropic nystagmus noted  spinning with return to sitting   Sidelying Right Symptoms --  R horizontal/geotropic nystagmus     Sidelying Left   Sidelying Left Duration mild dizziness, no nausea x 20 seconds   Sidelying Left Symptoms No nystagmus  viewed in room light     Horizontal Canal Right   Horizontal Canal Right Duration reports trace sensation of mild dizziness, no nystagmus viewed in room light   Horizontal Canal Right Symptoms Normal     Horizontal Canal Left   Horizontal Canal Left Duration none   Horizontal Canal Left Symptoms Normal                 [facility] Adult PT Treatment/Exercise - 04/05/16 1884      Neuro Re-ed    Neuro Re-ed Details  Performed corner balance activities with eyes closed on compliant surfaces and provided for HEP.  Education re: goal of exercises for habituation of trace dizziness with positonal changes as well as addressing sensitivity to movement and diminished gaze that was noted during initial evaluation.          Vestibular Treatment/Exercise - 04/05/16 0955      Vestibular Treatment/Exercise   Vestibular Treatment Provided Habituation;Gaze   Habituation Exercises Laruth Bouchard Daroff;Horizontal Roll;Standing Horizontal Head  Turns   Gaze Exercises X1 Viewing Horizontal;X1 Viewing Vertical     Nestor Lewandowsky   Number of Reps  3   Symptom Description  patient reports symptom improvement with repetition     Horizontal Roll   Symptom Description  Recommended  continue horizontal roll at home.      Standing Horizontal Head Turns   Number of Reps  10   Symptom Description  with feet together on pillow in corner for balance.      X1 Viewing Horizontal   Foot Position standing with feet apart   Comments performed x 30 seconds with cues on ROM and speed of movement + visual fixation on target.     X1 Viewing Vertical   Foot Position standing with feet apart   Comments performed x 30 seconds x 1 rep               PT  Education - 04/05/16 1933    Education provided Yes   Education Details HEP: Added brandt daroff habituation, standing VOR x 1 gaze horiz/vertical, standing in corner with feet together + eyes closed, standing with feet together + head turns.   Person(s) Educated Patient   Methods Explanation;Handout   Comprehension Verbalized understanding;Returned demonstration          PT Short Term Goals - 03/29/16 1251      PT SHORT TERM GOAL #1   Title STG's = LTG's           PT Long Term Goals - 03/29/16 1251      PT LONG TERM GOAL #1   Title Positional vertigo testing will be negative to indicate resolved BPPV.   (Target: 04/26/16)     PT LONG TERM GOAL #2   Title Pt will demonstrate independence with habituation HEP to maximize functional gains made in PT.   (04/26/16)     PT LONG TERM GOAL #3   Title Pt will decrease DHI score from 76 to < / = 58 to indicate significant decrease in pt-perceived disability due to dizziness.  (04/26/16)     PT LONG TERM GOAL #4   Title Assess strength and balance, if indicated, after vertigo clears.  (04/26/16)               Plan - 04/05/16 1937    Clinical Impression Statement The patient has no nystagmus viewed today during horiozntal roll test, however has mild sensation of dizziness with nausea worse with R sidelying.  PT added brandt daroff habituation, as well as gaze activities.  PT also addressed functional use of vestibular system for balance with corner balance activities.  The patient expresses improvement through diminished intensity of symptoms, as well as dec'd need for nausea/vertigo meds since first treatment.  Continue to LTGs.    Rehab Potential Good   PT Treatment/Interventions ADLs/Self Care Home Management;Canalith Repostioning;Vestibular;Gait training;Stair training;Functional mobility training;Therapeutic activities;Patient/family education;Neuromuscular re-education;Therapeutic exercise;Balance training   PT Next Visit Plan  Reassess positional symptoms, check HEP and modify as indicated.     Consulted and Agree with Plan of Care Patient      Patient will benefit from skilled therapeutic intervention in order to improve the following deficits and impairments:     Visit Diagnosis: BPPV (benign paroxysmal positional vertigo), right  Dizziness and giddiness     Problem List Patient Active Problem List   Diagnosis Date Noted  . Dizziness and giddiness 03/22/2016  . Benign paroxysmal positional vertigo 03/22/2016  . Medicare annual wellness visit,  initial 09/16/2015  . Abnormal hearing screen 09/16/2015  . Fatigue 11/27/2014  . Allergic reaction 06/26/2014  . RBBB 12/19/2013  . Mitral valve prolapse   . HYPERTENSION, BENIGN 08/27/2010  . UNSPEC SYMPTOM ASSOC W/FEMALE GENITAL ORGANS 08/27/2010  . Anxiety state 06/27/2009  . CAD 06/27/2009  . Mitral valve disorder 06/27/2009  . HEMORRHOIDS 06/27/2009  . ALLERGIC RHINITIS 06/27/2009  . OSTEOPENIA 11/11/2006    Ameliyah Sarno, PT 04/05/2016, 7:39 PM  [facility] [address] [place], [place], [address] Phone: [phone]   Fax:  [phone]  Name: Hannah Vasquez MRN: [id number] Date of Birth: January 15, 1951

## 2016-04-07 ENCOUNTER — Ambulatory Visit: Payer: PPO | Admitting: Physical Therapy

## 2016-04-12 ENCOUNTER — Other Ambulatory Visit: Payer: Self-pay

## 2016-04-12 ENCOUNTER — Ambulatory Visit: Payer: PPO | Admitting: Physical Therapy

## 2016-04-12 DIAGNOSIS — R42 Dizziness and giddiness: Secondary | ICD-10-CM

## 2016-04-12 DIAGNOSIS — M8589 Other specified disorders of bone density and structure, multiple sites: Secondary | ICD-10-CM | POA: Diagnosis not present

## 2016-04-12 DIAGNOSIS — H8111 Benign paroxysmal vertigo, right ear: Secondary | ICD-10-CM | POA: Diagnosis not present

## 2016-04-12 DIAGNOSIS — M8580 Other specified disorders of bone density and structure, unspecified site: Secondary | ICD-10-CM | POA: Diagnosis not present

## 2016-04-12 DIAGNOSIS — Z1231 Encounter for screening mammogram for malignant neoplasm of breast: Secondary | ICD-10-CM | POA: Diagnosis not present

## 2016-04-12 MED ORDER — MECLIZINE HCL 25 MG PO TABS: 25.0000 mg | ORAL_TABLET | ORAL | 0 refills | Status: DC | PRN

## 2016-04-12 MED ORDER — ONDANSETRON HCL 4 MG PO TABS: 4.0000 mg | ORAL_TABLET | Freq: Three times a day (TID) | ORAL | 0 refills | Status: DC | PRN

## 2016-04-12 NOTE — Therapy (Addendum)
[facility] [address] [place], [place], [address] Phone: [phone]   Fax:  [phone]  Physical Therapy Treatment  Patient Details  Name: Hannah Vasquez MRN: [id number] Date of Birth: 02-16-1951 Referring Provider: Arnette Norris, MD  Encounter Date: 04/12/2016      PT End of Session - 04/12/16 2011    Visit Number 3   Number of Visits 7   Date for PT Re-Evaluation 04/28/16   Authorization Type [employer]   Authorization Time Period G Codes required   PT Start Time 1539   PT Stop Time 1611  no further treatment indicated at this time   PT Time Calculation (min) 32 min   Activity Tolerance Patient tolerated treatment well   Behavior During Therapy [facility] for tasks assessed/performed      Past Medical History:  Diagnosis Date  . Allergy   . Anxiety   . CAD (coronary artery disease)   . Hemorrhoids   . Hypertension   . Mitral valve prolapse   . Osteoporosis     Past Surgical History:  Procedure Laterality Date  . BLADDER SURGERY  11/15/1995   bladder neck suspension, urinary incontinence  . KNEE SURGERY  2003   torn meniscus  . MOHS SURGERY Right 2017   right side of face  . SHOULDER SURGERY Right 2005    There were no vitals filed for this visit.      Subjective Assessment - 04/12/16 1543    Subjective "There are days when I feel so much better, but on days like today, I don't feel great."   Pertinent History PMH significant for: CAD, mitral valve prolapse, HTN, osteopenia, RBBB, L cataract surgery scheduled for 04/2014   Patient Stated Goals "To get rid of the dizziness and nausea."   Currently in Pain? Yes   Pain Score 5    Pain Location Hand   Pain Orientation Posterior;Lower   Pain Descriptors / Indicators Headache   Pain Type Chronic pain   Pain Onset Today   Pain Frequency Constant   Aggravating Factors  movement   Pain Relieving Factors relaxation                Vestibular  Assessment - 04/12/16 0001      Positional Testing   Sidelying Test Sidelying Right;Sidelying Left   Horizontal Canal Testing Horizontal Canal Right;Horizontal Canal Left     Sidelying Right   Sidelying Right Duration nausea    Sidelying Right Symptoms No nystagmus     Sidelying Left   Sidelying Left Duration mild dizziness   Sidelying Left Symptoms No nystagmus     Horizontal Canal Right   Horizontal Canal Right Duration Mild nausea; no spinning   Horizontal Canal Right Symptoms Normal     Horizontal Canal Left   Horizontal Canal Left Duration Moderate nausea; no spinning   Horizontal Canal Left Symptoms Normal                  Vestibular Treatment/Exercise - 04/12/16 0001      Vestibular Treatment/Exercise   Vestibular Treatment Provided Habituation;Gaze   Habituation Exercises Brandt Daroff;Horizontal Roll   Gaze Exercises X1 Viewing Horizontal;X1 Viewing Vertical     Nestor Lewandowsky   Number of Reps  2   Symptom Description  After finishing the second rep, pt states, "I'm feeling much better; even the headache is not as bad."     Horizontal Roll   Number of Reps  2  Symptom Description  Reviewed horizontal rolling for habituation, as pt continues to experience symptoms with rolling.     Standing Horizontal Head Turns   Number of Reps  10   Symptom Description  feet together, standing on 1 pillow     X1 Viewing Horizontal   Foot Position standing; feet shoulder width apart   Reps 1   Comments x30 seconds; cueing for technique     X1 Viewing Vertical   Foot Position standing; feet shoulder width apart   Reps 1   Comments x30 seconds               PT Education - 04/12/16 2007    Education provided Yes   Education Details HEP reviewed/progressed; see Pt Instructions.   Person(s) Educated Patient   Methods Explanation;Demonstration;Verbal cues;Handout   Comprehension Verbalized understanding;Returned demonstration          PT Short Term  Goals - 03/29/16 1251      PT SHORT TERM GOAL #1   Title STG's = LTG's           PT Long Term Goals - 03/29/16 1251      PT LONG TERM GOAL #1   Title Positional vertigo testing will be negative to indicate resolved BPPV.   (Target: 04/26/16)     PT LONG TERM GOAL #2   Title Pt will demonstrate independence with habituation HEP to maximize functional gains made in PT.   (04/26/16)     PT LONG TERM GOAL #3   Title Pt will decrease DHI score from 76 to < / = 58 to indicate significant decrease in pt-perceived disability due to dizziness.  (04/26/16)     PT LONG TERM GOAL #4   Title Assess strength and balance, if indicated, after vertigo clears.  (04/26/16)               Plan - 04/12/16 2011    Clinical Impression Statement Pt continues to experience symptoms of motion sensitivity (no true vertigo, no nystagmus) with testing of B posterior and horizontal canals. Session focused on reviewing current HEP; pt required cueing for technique with exercises added at previous session. Progressed corner balance, as appropriate. Note that pt's headache pain rating decreased from 5/10 to 0/10 within session after performing habituation exercises.    Rehab Potential Good   PT Frequency Other (comment)  2x/week for 2 weeks, then 1x/week for 2 weeks   PT Duration Other (comment)  See above   PT Treatment/Interventions ADLs/Self Care Home Management;Canalith Repostioning;Vestibular;Gait training;Stair training;Functional mobility training;Therapeutic activities;Patient/family education;Neuromuscular re-education;Therapeutic exercise;Balance training   PT Next Visit Plan Reassess positional symptoms, check HEP and modify as indicated.     Consulted and Agree with Plan of Care Patient      Patient will benefit from skilled therapeutic intervention in order to improve the following deficits and impairments:  Dizziness  Visit Diagnosis: Dizziness and giddiness     Problem List Patient  Active Problem List   Diagnosis Date Noted  . Dizziness and giddiness 03/22/2016  . Benign paroxysmal positional vertigo 03/22/2016  . Medicare annual wellness visit, initial 09/16/2015  . Abnormal hearing screen 09/16/2015  . Fatigue 11/27/2014  . Allergic reaction 06/26/2014  . RBBB 12/19/2013  . Mitral valve prolapse   . HYPERTENSION, BENIGN 08/27/2010  . UNSPEC SYMPTOM ASSOC W/FEMALE GENITAL ORGANS 08/27/2010  . Anxiety state 06/27/2009  . CAD 06/27/2009  . Mitral valve disorder 06/27/2009  . HEMORRHOIDS 06/27/2009  . ALLERGIC RHINITIS 06/27/2009  .  OSTEOPENIA 11/11/2006    Billie Ruddy, PT, DPT [facility] [address] [place], [place], [address] Phone: [phone]   Fax:  [phone] 04/12/16, 8:18 PM  Name: REILLY MOLCHAN MRN: [id number] Date of Birth: 06/25/1951

## 2016-04-12 NOTE — Telephone Encounter (Signed)
Patient requests refills on Zofran 70m last filled on 03/29/16 for #20 and on Meclizine last filled on 03/17/16 for #60.  Last seen 03/22/16.

## 2016-04-12 NOTE — Patient Instructions (Addendum)
Tip Card 1.The goal of habituation training is to assist in decreasing symptoms of vertigo, dizziness, or nausea provoked by specific head and body motions. 2.These exercises may initially increase symptoms; however, be persistent and work through symptoms. With repetition and time, the exercises will assist in reducing or eliminating symptoms. 3.Exercises should be stopped and discussed with the therapist if you experience any of the following: - Sudden change or fluctuation in hearing - New onset of ringing in the ears, or increase in current intensity - Any fluid discharge from the ear - Severe pain in neck or back - Extreme nausea  Copyright  VHI. All rights reserved.  Rolling   With pillow under head, start on back. Roll to your right side.  Hold until dizziness stops, plus 20 seconds and then roll to the left side.  Hold until dizziness stops, plus 20 seconds.  Repeat sequence 5 times per session. Do 2 sessions per day. You can stop performing this exercise after you have no symptoms with the exercise for 3 consecutive days. Copyright  VHI. All rights reserved.   Sit to Side-Lying   Sit on edge of bed. Lie down onto the right side and hold until dizziness stops, plus 20 seconds.  Return to sitting and wait until dizziness stops, plus 20 seconds.  Repeat to the left side. Repeat sequence 5 times per session. Do 2 sessions per day. You can stop performing this exercise after you have no symptoms with the exercise for 3 consecutive days.  Copyright  VHI. All rights reserved.  Gaze Stabilization: Tip Card 1.Target must remain in focus, not blurry, and appear stationary while head is in motion. 2.Perform exercises with small head movements (45 to either side of midline). 3.Increase speed of head motion so long as target is in focus. 4.If you wear eyeglasses, be sure you can see target through lens (therapist will give specific instructions for bifocal / progressive lenses). 5.These  exercises may provoke dizziness or nausea. Work through these symptoms. If too dizzy, slow head movement slightly. Rest between each exercise. 6.Exercises demand concentration; avoid distractions. 7.For safety, perform standing exercises close to a counter, wall, corner, or next to someone.  Copyright  VHI. All rights reserved.  Gaze Stabilization: Standing Feet Apart   Feet shoulder width apart, keeping eyes on target on wall 3 feet away, tilt head down slightly and move head side to side (as though you're shaking your head "no") for 30 seconds. Repeat while moving head up and down (as though nodding head "yes") for 30 seconds. Do 2 sessions per day.   Feet Together (Compliant Surface) Head Motion - Eyes Open    While standing on compliant surface: __pillow______, feet together, perform the following:  - With eyes open: move head slowly: right to left 10 times.  - With eyes closed: move head slowly: right to left 10 times.   Do __1-2__ sessions per day.  Copyright  VHI. All rights reserved.

## 2016-04-15 ENCOUNTER — Encounter: Payer: Self-pay | Admitting: Family Medicine

## 2016-04-15 ENCOUNTER — Ambulatory Visit (INDEPENDENT_AMBULATORY_CARE_PROVIDER_SITE_OTHER): Payer: PPO | Admitting: Cardiology

## 2016-04-15 ENCOUNTER — Ambulatory Visit: Payer: PPO | Admitting: Physical Therapy

## 2016-04-15 ENCOUNTER — Encounter: Payer: Self-pay | Admitting: Cardiology

## 2016-04-15 ENCOUNTER — Encounter (INDEPENDENT_AMBULATORY_CARE_PROVIDER_SITE_OTHER): Payer: Self-pay

## 2016-04-15 VITALS — BP 120/74 | HR 50 | Ht 62.0 in | Wt 157.8 lb

## 2016-04-15 DIAGNOSIS — I4510 Unspecified right bundle-branch block: Secondary | ICD-10-CM | POA: Diagnosis not present

## 2016-04-15 DIAGNOSIS — I10 Essential (primary) hypertension: Secondary | ICD-10-CM | POA: Diagnosis not present

## 2016-04-15 DIAGNOSIS — R42 Dizziness and giddiness: Secondary | ICD-10-CM

## 2016-04-15 DIAGNOSIS — Z01810 Encounter for preprocedural cardiovascular examination: Secondary | ICD-10-CM | POA: Diagnosis not present

## 2016-04-15 DIAGNOSIS — H8111 Benign paroxysmal vertigo, right ear: Secondary | ICD-10-CM | POA: Diagnosis not present

## 2016-04-15 DIAGNOSIS — R001 Bradycardia, unspecified: Secondary | ICD-10-CM

## 2016-04-15 DIAGNOSIS — I341 Nonrheumatic mitral (valve) prolapse: Secondary | ICD-10-CM

## 2016-04-15 MED ORDER — METOPROLOL SUCCINATE ER 50 MG PO TB24
50.0000 mg | ORAL_TABLET | Freq: Every day | ORAL | 3 refills | Status: DC
Start: 2016-04-15 — End: 2017-04-15

## 2016-04-15 NOTE — Therapy (Signed)
[facility] [address] [place], [place], [address] Phone: [phone]   Fax:  [phone]  Physical Therapy Treatment  Patient Details  Name: Hannah Vasquez MRN: [id number] Date of Birth: 07/14/1951 Referring Provider: Arnette Norris, MD  Encounter Date: 04/15/2016      PT End of Session - 04/15/16 1144    Visit Number 4   Number of Visits 7   Date for PT Re-Evaluation 04/28/16   Authorization Type [employer]   Authorization Time Period G Codes required   PT Start Time 1101   PT Stop Time 1130  Session ended early due to no further treatment indicated at this time   PT Time Calculation (min) 29 min   Activity Tolerance Patient tolerated treatment well   Behavior During Therapy [facility] for tasks assessed/performed      Past Medical History:  Diagnosis Date  . Allergy   . Anxiety   . CAD (coronary artery disease)   . Hemorrhoids   . Hypertension   . Mitral valve prolapse   . Osteoporosis     Past Surgical History:  Procedure Laterality Date  . BLADDER SURGERY  11/15/1995   bladder neck suspension, urinary incontinence  . KNEE SURGERY  2003   torn meniscus  . MOHS SURGERY Right 2017   right side of face  . SHOULDER SURGERY Right 2005    There were no vitals filed for this visit.      Subjective Assessment - 04/15/16 1103    Subjective "I'm still having some dizziness, but it's definitely not as bad. It's getting better little by little."   Pertinent History PMH significant for: CAD, mitral valve prolapse, HTN, osteopenia, RBBB, L cataract surgery scheduled for 04/2014   Patient Stated Goals "To get rid of the dizziness and nausea."   Currently in Pain? No/denies                Vestibular Assessment - 04/15/16 0001      Vestibular Assessment   General Observation No meclizine and no Zofran x2 days.     Positional Testing   Sidelying Test Sidelying Right;Sidelying Left     Sidelying Right    Sidelying Right Duration NA   Sidelying Right Symptoms No nystagmus     Sidelying Left   Sidelying Left Duration NA   Sidelying Left Symptoms No nystagmus     Positional Sensitivities   Rolling Right Mild dizziness   Rolling Left Lightheadedness   Positional Sensitivities Comments 5/10 dizziness with transiton from L sidelying > sit; 4/10 dizziness with transition from R sidelying > sit.                 [facility] PT Treatment/Exercise - 04/15/16 0001      Standardized Balance Assessment   Standardized Balance Assessment Dynamic Gait Index     Dynamic Gait Index   Level Surface Normal   Change in Gait Speed Normal   Gait with Horizontal Head Turns Mild Impairment   Gait with Vertical Head Turns Mild Impairment   Gait and Pivot Turn Mild Impairment  pt reports disequilibrium   Step Over Obstacle Normal   Step Around Obstacles Normal   Steps Mild Impairment   Total Score 20         Vestibular Treatment/Exercise - 04/15/16 0001      Vestibular Treatment/Exercise   Vestibular Treatment Provided Gaze   Gaze Exercises X1 Viewing Horizontal;X1 Viewing Vertical     X1 Viewing Horizontal  Foot Position standing; feet shoulder width apart   Reps 2   Comments x30 seconds, then x45 seconds      X1 Viewing Vertical   Foot Position standing; feet shoulder width   Reps 1   Comments x45 seconds            Balance Exercises - 04/15/16 1139      Balance Exercises: Standing   Standing Eyes Opened Narrow base of support (BOS);Head turns;Foam/compliant surface;5 reps  2 pillows; horiz, vertical head turns x5 each   Standing Eyes Closed Narrow base of support (BOS);Foam/compliant surface;Head turns;Other reps (comment)  2 pillows; horiz, vertical head turns x10 each             PT Short Term Goals - 03/29/16 1251      PT SHORT TERM GOAL #1   Title STG's = LTG's           PT Long Term Goals - 04/15/16 1152      PT LONG TERM GOAL #1   Title  Positional vertigo testing will be negative to indicate resolved BPPV.   (Target: 04/26/16)   Baseline 9/31: positional testing (-) for nystagmus but pt continues to experience motion sensitivity.   Status On-going     PT LONG TERM GOAL #2   Title Pt will demonstrate independence with habituation HEP to maximize functional gains made in PT.   (04/26/16)   Baseline Met 8/31.   Status Achieved     PT LONG TERM GOAL #3   Title Pt will decrease DHI score from 76 to < / = 58 to indicate significant decrease in pt-perceived disability due to dizziness.  (04/26/16)   Status On-going     PT LONG TERM GOAL #4   Title Assess strength and balance, if indicated, after vertigo clears.  (04/26/16)   Baseline 8/31: DGI = 20/24   Status Achieved               Plan - 04/15/16 1145    Clinical Impression Statement All positional testing (-) for nystagmus; however, pt continues to report motion sensitivity with B horizontal rolling and R/L sidelying > sit. Completed DHI with score of 20/24; increased disequilibirum with 180-degree turn during gait and mildly decreased gait stability with horizontal and vertical head turns. Progressed gaze stabilization and corner balance exercises. DGI score = 20/24; no significant dynamic gait instability, but dysequilibrium with turning 180 degrees.   Rehab Potential Good   PT Frequency Other (comment)  2x/week for 2 weeks, then 1x/week for 2 weeks   PT Duration Other (comment)  See above   PT Treatment/Interventions ADLs/Self Care Home Management;Canalith Repostioning;Vestibular;Gait training;Stair training;Functional mobility training;Therapeutic activities;Patient/family education;Neuromuscular re-education;Therapeutic exercise;Balance training   PT Next Visit Plan Consider modifying HEP remove corner balance and add 180 or 360-degree turns. POC: decrease frequency vs. DC   Consulted and Agree with Plan of Care Patient      Patient will benefit from skilled  therapeutic intervention in order to improve the following deficits and impairments:  Dizziness  Visit Diagnosis: Dizziness and giddiness     Problem List Patient Active Problem List   Diagnosis Date Noted  . Dizziness and giddiness 03/22/2016  . Benign paroxysmal positional vertigo 03/22/2016  . Medicare annual wellness visit, initial 09/16/2015  . Abnormal hearing screen 09/16/2015  . Fatigue 11/27/2014  . Allergic reaction 06/26/2014  . RBBB 12/19/2013  . Mitral valve prolapse   . HYPERTENSION, BENIGN 08/27/2010  . UNSPEC SYMPTOM ASSOC W/FEMALE  GENITAL ORGANS 08/27/2010  . Anxiety state 06/27/2009  . CAD 06/27/2009  . Mitral valve disorder 06/27/2009  . HEMORRHOIDS 06/27/2009  . ALLERGIC RHINITIS 06/27/2009  . OSTEOPENIA 11/11/2006   Billie Ruddy, PT, DPT [facility] [address] [place], [place], [address] Phone: [phone]   Fax:  [phone] 04/15/16, 11:55 AM  Name: Hannah Vasquez MRN: [id number] Date of Birth: 1950-12-14

## 2016-04-15 NOTE — Patient Instructions (Addendum)
Tip Card 1.The goal of habituation training is to assist in decreasing symptoms of vertigo, dizziness, or nausea provoked by specific head and body motions. 2.These exercises may initially increase symptoms; however, be persistent and work through symptoms. With repetition and time, the exercises will assist in reducing or eliminating symptoms. 3.Exercises should be stopped and discussed with the therapist if you experience any of the following: - Sudden change or fluctuation in hearing - New onset of ringing in the ears, or increase in current intensity - Any fluid discharge from the ear - Severe pain in neck or back - Extreme nausea  Copyright  VHI. All rights reserved. Rolling   With pillow under head, start on back. Roll to your right side. Hold until dizziness stops, plus 20 seconds and then roll to the left side. Hold until dizziness stops, plus 20 seconds. Repeat sequence 5 times per session. Do 2 sessions per day. You can stop performing this exercise after you have no symptoms with the exercise for 3 consecutive days. Copyright  VHI. All rights reserved.  Sit to Side-Lying   Sit on edge of bed. Lie down onto the right side and hold until dizziness stops, plus 20 seconds. Return to sitting and wait until dizziness stops, plus 20 seconds. Repeat to the left side. Repeat sequence 5 times per session. Do 2 sessions per day. You can stop performing this exercise after you have no symptoms with the exercise for 3 consecutive days.  Copyright  VHI. All rights reserved. Gaze Stabilization: Tip Card 1.Target must remain in focus, not blurry, and appear stationary while head is in motion. 2.Perform exercises with small head movements (45 to either side of midline). 3.Increase speed of head motion so long as target is in focus. 4.If you wear eyeglasses, be sure you can see target through lens (therapist will give specific instructions for bifocal / progressive lenses). 5.These  exercises may provoke dizziness or nausea. Work through these symptoms. If too dizzy, slow head movement slightly. Rest between each exercise. 6.Exercises demand concentration; avoid distractions. 7.For safety, perform standing exercises close to a counter, wall, corner, or next to someone.  Copyright  VHI. All rights reserved. Gaze Stabilization: Standing Feet Apart   Feet shoulder width apart, keeping eyes on target on wall 3 feet away, tilt head down slightly and move head side to side (as though you're shaking your head "no") for 45 seconds. Repeat while moving head up and down (as though nodding head "yes") for 45 seconds. Do 2 sessions per day.   Feet Together (Compliant Surface) Head Motion - Eyes Open    While standing on compliant surface: __2 pillows_____, feet together, perform the following:  - With eyes open: move head slowly: right to left 10 times; up and down 10 times.  - With eyes closed: move head slowly: right to left 10 times. up and down 10 times.   Do __1-2__ sessions per day.

## 2016-04-15 NOTE — Progress Notes (Signed)
[facility]. [address] [place], [place]  [address] Phone: [phone] Fax:  [phone]  Date:  04/15/2016   ID:  Hannah Vasquez, Hannah Vasquez 1951/04/16, MRN [id number]  PCP:  Arnette Norris, MD   History of Present Illness: Hannah Vasquez is a 65 y.o. female here for clinic follow up. Overall she's been doing quite well. She lost a significant amount of weight through exercise down to 142 pounds. She's gained some of this back. Her shortness of breath felt better with decreased weight and increased stamina. Reassuring.  She recently had a dental implant and she will be undergoing eye surgery/cataract surgery soon with Dr. Talbert Forest, 04/30/16. She may proceed with the surgery with low overall cardiac risk.  She also has been battling with occasional episodes of positional vertigo. She is seeing therapy for that. I also noticed that her heart rate upon laying down was 50 bpm. We decided to cut back her Toprol from 100 down to 50.  Previous visits: 9/13 - shingles then PNA. Said she had shingles all over?. Started on leg. A doctor at the hospital when she was visiting her father turned to her and asked her what was wrong. He then got a second opinion and told her that she had shingles. 5/14 - back pain, injections, PT 3/15 - feeling so out of breath, walking from here to door. Fatigue.  Previously out of breath. No chest pain. At times feels chest tightness. When SOB happens, sometimes just talking at rest, needs to stop and take a deep breath. Felt winded.    In 2008 she underwent nuclear stress test which was low risk with no ischemia and normal ejection fraction. She also had an echocardiogram which showed normal ejection fraction and mild mitral valve prolapse.      Wt Readings from Last 3 Encounters:  04/15/16 157 lb 12.8 oz (71.6 kg)  03/22/16 154 lb 12 oz (70.2 kg)  03/17/16 155 lb (70.3 kg)     Past Medical History:  Diagnosis Date  . Allergy   . Anxiety   . CAD (coronary  artery disease)   . Hemorrhoids   . Hypertension   . Mitral valve prolapse   . Osteoporosis     Past Surgical History:  Procedure Laterality Date  . BLADDER SURGERY  11/15/1995   bladder neck suspension, urinary incontinence  . KNEE SURGERY  2003   torn meniscus  . MOHS SURGERY Right 2017   right side of face  . SHOULDER SURGERY Right 2005    Current Outpatient Prescriptions  Medication Sig Dispense Refill  . aspirin EC 81 MG tablet Take 81 mg by mouth every morning.     . Calcium Carbonate-Vit D-Min 600-400 MG-UNIT TABS Take 1 tablet by mouth every morning.     . Cholecalciferol (VITAMIN D3) 1000 UNITS CAPS Take 1 capsule by mouth every morning.     Marland Kitchen desonide (DESOWEN) 0.05 % cream Apply topically 2 (two) times daily.    . fluocinonide-emollient (LIDEX-E) 0.05 % cream Apply 1 application topically daily as needed (to legs for rash). 30 g 2  . fluticasone (FLONASE) 50 MCG/ACT nasal spray Place 2 sprays into both nostrils as needed.    . hydrochlorothiazide (HYDRODIURIL) 12.5 MG tablet TAKE ONE TABLET BY MOUTH EVERY MORNING 90 tablet 1  . ketoconazole (NIZORAL) 2 % cream Apply 1 application topically daily.    Marland Kitchen loratadine (CLARITIN) 10 MG tablet Take 1 tablet (10 mg total) by  mouth every morning. 30 tablet 2  . meclizine (ANTIVERT) 25 MG tablet Take 1 tablet (25 mg total) by mouth every 4 (four) hours as needed for dizziness. 60 tablet 0  . metoprolol succinate (TOPROL-XL) 50 MG 24 hr tablet Take 1 tablet (50 mg total) by mouth daily. Take with or immediately following a meal. 90 tablet 3  . Multiple Vitamin (MULTIVITAMIN) tablet Take 1 tablet by mouth every morning.     . naproxen (NAPROSYN) 500 MG tablet Take 500 mg by mouth 2 (two) times daily with a meal.    . Omega-3 Fatty Acids (FISH OIL) 1200 MG CAPS Take 1 capsule by mouth every morning.     . ondansetron (ZOFRAN ODT) 4 MG disintegrating tablet Take 1 tablet (4 mg total) by mouth every 8 (eight) hours as needed for nausea  or vomiting. 20 tablet 0  . ondansetron (ZOFRAN) 4 MG tablet Take 1 tablet (4 mg total) by mouth every 8 (eight) hours as needed for nausea or vomiting. 20 tablet 0  . pregabalin (LYRICA) 75 MG capsule Take 75 mg by mouth 2 (two) times daily.    Marland Kitchen PROAIR HFA 108 (90 Base) MCG/ACT inhaler INHALE 2 PUFFS INTO THE LUNGS EVERY 4 HOURS AS NEEDED 8.5 g 5   No current facility-administered medications for this visit.     Allergies:    Allergies  Allergen Reactions  . Codeine     REACTION: nausea and vomiting  . Penicillins     REACTION: rash    Social History:  The patient  reports that she has never smoked. She has never used smokeless tobacco.  Retired Family History  Problem Relation Age of Onset  . Mitral valve prolapse Mother   . Hypertension Father     ROS:  Please see the history of present illness.   No HA, no fevers.    All other systems reviewed and negative.   PHYSICAL EXAM: VS:  BP 120/74   Pulse (!) 50   Ht 5' 2"  (1.575 m)   Wt 157 lb 12.8 oz (71.6 kg)   BMI 28.86 kg/m  Well nourished, well developed, in no acute distress  HEENT: normal, [place]/AT, EOMI Neck: no JVD, normal carotid upstroke, no bruit Cardiac:  normal S1, S2; RRR; soft systolic murmur apex, barely audible Lungs:  clear to auscultation bilaterally, no wheezing, rhonchi or rales  Abd: soft, nontender, no hepatomegaly, no bruits  Ext: no edema, 2+ distal pulses Skin: warm and dry  GU: deferred Neuro: no focal abnormalities noted, AAO x 3  EKG:  EKG was ordered today-04/15/16-sinus bradycardia, 50 right bundle branch block personally viewed-no significant change from prior SR RBBB.   Chronic  NUC 2008 - low risk no ischemia.   ECHO: 01/03/14: - The LV function is normal. There is minimal MVP with trace and insignificant MR.  LABS: LDL 124, creatinine 0.9, hemoglobin 14.1, vitamin B12 232, TSH 2.23  ASSESSMENT AND PLAN:  1. Preoperative cardiovascular evaluation-low risk cataract surgery, Dr.  Talbert Forest mid-September. She may proceed with low overall cardiac risk. No medications need to be held. 2. Shortness of breath-resolved-reassuring echo last check. Felt improvement with her symptoms on exercise and weight loss. She is currently doing well. 3. Continue to encourage exercise. I'm comfortable with her proceeding with this gradually increasing her stamina. 4. Mitral valve prolapse-previously mild in 2015, trace MR. Realistically, likely does not need repeat echocardiogram unless murmur significantly changes or symptoms significantly change. 5. Hypertension-medications reviewed. 6. RBBB -  chronic. Given her heart rate of 50, sinus bradycardia, we will go ahead and decrease her Toprol-XL from 100 mg down to 50 mg. She has been on this 100 mg dose for quite some time. This should not significantly affect her blood pressure. 7. BPPV - rehab. Dr. Deborra Medina.  8. When necessary follow up. Please let me know if I can be of any further assistance.  Signed, Candee Furbish, MD [facility]  04/15/2016 8:35 AM

## 2016-04-15 NOTE — Patient Instructions (Signed)
Medication Instructions:  DECREASE: Toprol XL to 50 mg daily. Continue all other medications as listed.    Follow-Up: Follow-up with Dr. Marlou Porch as needed.    If you need a refill on your cardiac medications before your next appointment, please call your pharmacy.

## 2016-04-16 ENCOUNTER — Encounter: Payer: Self-pay | Admitting: Family Medicine

## 2016-04-21 ENCOUNTER — Ambulatory Visit: Payer: PPO | Attending: Family Medicine | Admitting: Physical Therapy

## 2016-04-21 ENCOUNTER — Encounter: Payer: Self-pay | Admitting: *Deleted

## 2016-04-21 DIAGNOSIS — R42 Dizziness and giddiness: Secondary | ICD-10-CM | POA: Diagnosis not present

## 2016-04-21 NOTE — Patient Instructions (Signed)
Hannah Vasquez, Start the exercise below as soon as your eye doctor/surgeon says it's okay to return to normal activity.  Feet Together (Compliant Surface) Head Motion - Eyes Open    Stand with your back to a corner with a stable chair in front of you. While standing on 2 pillows (or 1 couch cushion) with feet together and eyes open, move head slowly: up and down 10 times; right to left 10 times; diagonally from up-right to down-left 10 times; and diagonally from up-left to down-right 10 times. Do _2__ sessions per day.  Copyright  VHI. All rights reserved.

## 2016-04-21 NOTE — Therapy (Signed)
[facility] [address] [place], [place], [address] Phone: [phone]   Fax:  [phone]  Physical Therapy Treatment and Discharge Summary  Patient Details  Name: Hannah Vasquez MRN: [id number] Date of Birth: 1950/09/17 Referring Provider: Arnette Norris, MD  Encounter Date: 04/21/2016      PT End of Session - 04/21/16 0951    Visit Number 5   Number of Visits 7   Date for PT Re-Evaluation 04/28/16   Authorization Type [employer]   Authorization Time Period G Codes required   PT Start Time 0844   PT Stop Time 0927   PT Time Calculation (min) 43 min   Activity Tolerance Patient tolerated treatment well   Behavior During Therapy [facility] for tasks assessed/performed      Past Medical History:  Diagnosis Date  . Allergy   . Anxiety   . CAD (coronary artery disease)   . Hemorrhoids   . Hypertension   . Mitral valve prolapse   . Osteoporosis     Past Surgical History:  Procedure Laterality Date  . BLADDER SURGERY  11/15/1995   bladder neck suspension, urinary incontinence  . KNEE SURGERY  2003   torn meniscus  . MOHS SURGERY Right 2017   right side of face  . SHOULDER SURGERY Right 2005    There were no vitals filed for this visit.      Subjective Assessment - 04/21/16 0845    Subjective Pt reports she is experiencing "hardly any dizziness" at this time. Pt states, "On Saturday and Sunday, I felt better than I've felt in months; but I'm having trouble differentiating the vertigo-dizziness from the imbalance related to my vision." Pt planning to have cataract extraction surgery next week.   Pertinent History PMH significant for: CAD, mitral valve prolapse, HTN, osteopenia, RBBB, L cataract surgery scheduled for 04/2014   Patient Stated Goals "To get rid of the dizziness and nausea."   Currently in Pain? No/denies                Vestibular Assessment - 04/21/16 0001      Positional Testing   Sidelying Test Sidelying Right;Sidelying Left   Horizontal Canal Testing Horizontal Canal Right;Horizontal Canal Left     Sidelying Right   Sidelying Right Duration NA   Sidelying Right Symptoms No nystagmus     Sidelying Left   Sidelying Left Duration NA   Sidelying Left Symptoms No nystagmus     Horizontal Canal Right   Horizontal Canal Right Duration NA   Horizontal Canal Right Symptoms Normal     Horizontal Canal Left   Horizontal Canal Left Duration NA   Horizontal Canal Left Symptoms Normal     Equities trader Comment SOT composite score = 65% (below age/height norm of 68%). Sensory analysis: somatosensory WNL, vestibular WNL; visual = 54%, which is below age/height matched normative value of 85%. SOT findings suggest abnormal ability to use visual input for balance. Strategy analysis: ankle dominant during Condition 4; COG alignment to L of midline with slight posterior preference.                       Balance Exercises - 04/21/16 0948      Balance Exercises: Standing   Standing Eyes Opened Narrow base of support (BOS);Foam/compliant surface;Head turns;Other reps (comment)  2 pillows; horiz, vert, diag turns x10 each   Gait with Head Turns Forward;2 reps;Other (  comment)  2 x50' with horiz head turns; no difficulty           PT Education - 2016-05-20 0955    Education provided Yes   Education Details PT goals, findings, progress, and DC plan. Explained SOT findings and functional implications. HEP provided for visual reliance (to be performed when activity restrictions lifted after surgery).   Person(s) Educated Patient   Methods Explanation;Demonstration;Handout;Verbal cues   Comprehension Verbalized understanding;Returned demonstration          PT Short Term Goals - 03/29/16 1251      PT SHORT TERM GOAL #1   Title STG's = LTG's           PT Long Term Goals - 2016-05-20 4270      PT  LONG TERM GOAL #1   Title Positional vertigo testing will be negative to indicate resolved BPPV.   (Target: 04/26/16)   Baseline 2023-05-21: positional testing (-) and asymptomatic   Status Achieved     PT LONG TERM GOAL #2   Title Pt will demonstrate independence with habituation HEP to maximize functional gains made in PT.   (04/26/16)   Baseline Met 8/31.   Status Achieved     PT LONG TERM GOAL #3   Title Pt will decrease DHI score from 76 to < / = 58 to indicate significant decrease in pt-perceived disability due to dizziness.  (04/26/16)   Baseline 21-May-2023: DHI = 26   Status Achieved     PT LONG TERM GOAL #4   Title Assess strength and balance, if indicated, after vertigo clears.  (04/26/16)   Baseline 8/31: DGI = 20/24   Status Achieved               Plan - May 20, 2016 0956    Clinical Impression Statement All positional testing (-) and asymptomatic. Due to pt report of difficulty discerning vertiginous dizziness of vision-related imbalance, completred SOT today. SOT composite score = 65%, which is below age/height normative value. Pt ability to use somatosensory and vestibular input for balance both WNL. Pt ability to use visual input for balance is below age/height norm. Therefore, conclude that balance impairments/disequilibrium are now related to visual impairments associated with cataracts rather than vestibular impairments. Will DC from PT at this time; pt in full agreement with DC plan. Provided patient with HEP to perform after surgical restrictions limited to increase reliance on visual input for balance.    Consulted and Agree with Plan of Care Patient      Patient will benefit from skilled therapeutic intervention in order to improve the following deficits and impairments:     Visit Diagnosis: Dizziness and giddiness       G-Codes - 05-20-2016 0959    Functional Assessment Tool Used DHI = 26   Functional Limitation Self care   Self Care Goal Status (W2376) At least 40  percent but less than 60 percent impaired, limited or restricted   Self Care Discharge Status [phone]) At least 20 percent but less than 40 percent impaired, limited or restricted      Problem List Patient Active Problem List   Diagnosis Date Noted  . Dizziness and giddiness 03/22/2016  . Benign paroxysmal positional vertigo 03/22/2016  . Medicare annual wellness visit, initial 09/16/2015  . Abnormal hearing screen 09/16/2015  . Fatigue 11/27/2014  . Allergic reaction 06/26/2014  . RBBB 12/19/2013  . Mitral valve prolapse   . HYPERTENSION, BENIGN 08/27/2010  . UNSPEC SYMPTOM ASSOC W/FEMALE GENITAL ORGANS  08/27/2010  . Anxiety state 06/27/2009  . CAD 06/27/2009  . Mitral valve disorder 06/27/2009  . HEMORRHOIDS 06/27/2009  . ALLERGIC RHINITIS 06/27/2009  . OSTEOPENIA 11/11/2006    PHYSICAL THERAPY DISCHARGE SUMMARY  Visits from Start of Care: 5  Current functional level related to goals / functional outcomes: See above.   Remaining deficits: See above.   Education / Equipment: Vestibular/balance HEP and progression. Education on SOT findings, implications.   Plan: Patient agrees to discharge.  Patient goals were met. Patient is being discharged due to meeting the stated rehab goals.  ?????         Billie Ruddy, PT, DPT [facility] [address] [place], [place], [address] Phone: [phone]   Fax:  [phone] 04/21/16, 10:01 AM  Name: Hannah Vasquez MRN: [id number] Date of Birth: 27-Mar-1951

## 2016-04-26 ENCOUNTER — Ambulatory Visit (INDEPENDENT_AMBULATORY_CARE_PROVIDER_SITE_OTHER): Payer: PPO | Admitting: Family Medicine

## 2016-04-26 ENCOUNTER — Encounter: Payer: Self-pay | Admitting: Family Medicine

## 2016-04-26 DIAGNOSIS — R1310 Dysphagia, unspecified: Secondary | ICD-10-CM | POA: Insufficient documentation

## 2016-04-26 NOTE — Progress Notes (Signed)
Subjective:   Patient ID: Hannah Vasquez, female    DOB: 1950/09/21, 65 y.o.   MRN: [id number]  Hannah Vasquez is a pleasant 65 y.o. year old female who presents to clinic today with Gastroesophageal Reflux  on 04/26/2016  HPI:  Dysphagia- intermittent for past 3 months.  Being more frequent.  Occurring with both solids and liquids.  At times, has had to regurgitate the food. Does not smoke or drink ETOH.  Has never had an endoscopy.  Does not have GERD that she is aware of.  Current Outpatient Prescriptions on File Prior to Visit  Medication Sig Dispense Refill  . aspirin EC 81 MG tablet Take 81 mg by mouth every morning.     . Calcium Carbonate-Vit D-Min 600-400 MG-UNIT TABS Take 1 tablet by mouth every morning.     . Cholecalciferol (VITAMIN D3) 1000 UNITS CAPS Take 1 capsule by mouth every morning.     Marland Kitchen desonide (DESOWEN) 0.05 % cream Apply topically 2 (two) times daily.    . fluocinonide-emollient (LIDEX-E) 0.05 % cream Apply 1 application topically daily as needed (to legs for rash). 30 g 2  . fluticasone (FLONASE) 50 MCG/ACT nasal spray Place 2 sprays into both nostrils as needed.    . hydrochlorothiazide (HYDRODIURIL) 12.5 MG tablet TAKE ONE TABLET BY MOUTH EVERY MORNING 90 tablet 1  . ketoconazole (NIZORAL) 2 % cream Apply 1 application topically daily.    Marland Kitchen loratadine (CLARITIN) 10 MG tablet Take 1 tablet (10 mg total) by mouth every morning. 30 tablet 2  . meclizine (ANTIVERT) 25 MG tablet Take 1 tablet (25 mg total) by mouth every 4 (four) hours as needed for dizziness. 60 tablet 0  . metoprolol succinate (TOPROL-XL) 50 MG 24 hr tablet Take 1 tablet (50 mg total) by mouth daily. Take with or immediately following a meal. 90 tablet 3  . Multiple Vitamin (MULTIVITAMIN) tablet Take 1 tablet by mouth every morning.     . naproxen (NAPROSYN) 500 MG tablet Take 500 mg by mouth 2 (two) times daily with a meal.    . Omega-3 Fatty Acids (FISH OIL) 1200 MG CAPS Take 1 capsule by mouth  every morning.     . ondansetron (ZOFRAN ODT) 4 MG disintegrating tablet Take 1 tablet (4 mg total) by mouth every 8 (eight) hours as needed for nausea or vomiting. 20 tablet 0  . ondansetron (ZOFRAN) 4 MG tablet Take 1 tablet (4 mg total) by mouth every 8 (eight) hours as needed for nausea or vomiting. 20 tablet 0  . pregabalin (LYRICA) 75 MG capsule Take 75 mg by mouth 2 (two) times daily.    Marland Kitchen PROAIR HFA 108 (90 Base) MCG/ACT inhaler INHALE 2 PUFFS INTO THE LUNGS EVERY 4 HOURS AS NEEDED 8.5 g 5   No current facility-administered medications on file prior to visit.     Allergies  Allergen Reactions  . Codeine     REACTION: nausea and vomiting  . Penicillins     REACTION: rash    Past Medical History:  Diagnosis Date  . Allergy   . Anxiety   . CAD (coronary artery disease)   . Hemorrhoids   . Hypertension   . Mitral valve prolapse   . Osteoporosis     Past Surgical History:  Procedure Laterality Date  . BLADDER SURGERY  11/15/1995   bladder neck suspension, urinary incontinence  . KNEE SURGERY  2003   torn meniscus  . MOHS SURGERY Right 2017  right side of face  . SHOULDER SURGERY Right 2005    Family History  Problem Relation Age of Onset  . Mitral valve prolapse Mother   . Hypertension Father     Social History   Social History  . Marital status: Divorced    Spouse name: N/A  . Number of children: 2  . Years of education: N/A   Occupational History  . Medical office---front desk     Retired   Social History Main Topics  . Smoking status: Never Smoker  . Smokeless tobacco: Never Used  . Alcohol use Not on file  . Drug use: Unknown  . Sexual activity: Not on file   Other Topics Concern  . Not on file   Social History Narrative   Has a living will.   HPOA- daughter.   She would desire CPR.  Would desire life support if necessary and not futile.         The PMH, PSH, Social History, Family History, Medications, and allergies have been reviewed  in [facility], and have been updated if relevant.   Review of Systems  Constitutional: Negative.   HENT: Positive for trouble swallowing. Negative for sore throat and voice change.   Gastrointestinal: Negative.   All other systems reviewed and are negative.      Objective:    BP 126/68   Pulse 62   Temp 98.1 F (36.7 C) (Oral)   Wt 156 lb (70.8 kg)   SpO2 99%   BMI 28.53 kg/m    Physical Exam  Constitutional: She is oriented to person, place, and time. She appears well-developed and well-nourished. No distress.  HENT:  Head: Normocephalic.  Eyes: Conjunctivae are normal.  Cardiovascular: Normal rate.   Pulmonary/Chest: Effort normal.  Abdominal: Soft. Bowel sounds are normal. She exhibits no distension. There is no tenderness.  Musculoskeletal: Normal range of motion.  Neurological: She is alert and oriented to person, place, and time. No cranial nerve deficit.  Skin: Skin is warm and dry. She is not diaphoretic.  Psychiatric: She has a normal mood and affect. Her behavior is normal. Judgment and thought content normal.  Nursing note and vitals reviewed.         Assessment & Plan:   Dysphagia - Plan: Ambulatory referral to Gastroenterology No Follow-up on file.

## 2016-04-26 NOTE — Patient Instructions (Signed)
Great to see you. Please stop by to see Rosaria Ferries on your out.

## 2016-04-26 NOTE — Assessment & Plan Note (Signed)
New, progressive. Refer to GI for endoscopy- re: rule out esophageal stricture/ malignancy. The patient indicates understanding of these issues and agrees with the plan. Orders Placed This Encounter  Procedures  . Ambulatory referral to Gastroenterology

## 2016-04-26 NOTE — Progress Notes (Signed)
Pre visit review using our clinic review tool, if applicable. No additional management support is needed unless otherwise documented below in the visit note. 

## 2016-04-30 DIAGNOSIS — H2512 Age-related nuclear cataract, left eye: Secondary | ICD-10-CM | POA: Diagnosis not present

## 2016-04-30 DIAGNOSIS — Z961 Presence of intraocular lens: Secondary | ICD-10-CM | POA: Diagnosis not present

## 2016-04-30 HISTORY — PX: CATARACT EXTRACTION: SUR2

## 2016-05-06 ENCOUNTER — Encounter: Payer: PPO | Admitting: Physical Therapy

## 2016-05-21 DIAGNOSIS — R1310 Dysphagia, unspecified: Secondary | ICD-10-CM | POA: Diagnosis not present

## 2016-05-31 ENCOUNTER — Telehealth: Payer: Self-pay | Admitting: Family Medicine

## 2016-05-31 NOTE — Telephone Encounter (Signed)
Pt is aware and will call back next week to schedule

## 2016-05-31 NOTE — Telephone Encounter (Signed)
We will be in clinic tomorrow during that time frame. There is a flu clinic that starts at 2pm and she is more than welcome to come during that time. Otherwise, she may schedule on a day that the flu clinic is open all day and may better suit her needs

## 2016-05-31 NOTE — Telephone Encounter (Signed)
Pt called she has an appointment tomorrow in the area at 9:30 and wanted to know if she could get her flu shot between 8:45 and 9.  Is it ok to schedule

## 2016-06-10 ENCOUNTER — Other Ambulatory Visit: Payer: Self-pay

## 2016-06-10 MED ORDER — PREGABALIN 75 MG PO CAPS: 75.0000 mg | ORAL_CAPSULE | Freq: Two times a day (BID) | ORAL | 0 refills | Status: DC

## 2016-06-10 MED ORDER — NAPROXEN 500 MG PO TABS: 500.0000 mg | ORAL_TABLET | Freq: Two times a day (BID) | ORAL | 0 refills | Status: DC

## 2016-06-10 NOTE — Telephone Encounter (Signed)
Rx faxed to requested pharmacy

## 2016-06-10 NOTE — Telephone Encounter (Signed)
Pt left v/m requesting refill naproxen and lyrica to [facility] pharmacy; Dr Deborra Medina has not filled before; last annual 09/16/15.

## 2016-06-11 ENCOUNTER — Ambulatory Visit (INDEPENDENT_AMBULATORY_CARE_PROVIDER_SITE_OTHER): Payer: PPO

## 2016-06-11 DIAGNOSIS — Z23 Encounter for immunization: Secondary | ICD-10-CM | POA: Diagnosis not present

## 2016-06-15 ENCOUNTER — Encounter: Payer: Self-pay | Admitting: Internal Medicine

## 2016-06-15 ENCOUNTER — Ambulatory Visit (INDEPENDENT_AMBULATORY_CARE_PROVIDER_SITE_OTHER): Payer: PPO | Admitting: Internal Medicine

## 2016-06-15 VITALS — BP 124/82 | HR 58 | Temp 98.4°F | Wt 158.5 lb

## 2016-06-15 DIAGNOSIS — B370 Candidal stomatitis: Secondary | ICD-10-CM

## 2016-06-15 DIAGNOSIS — K1370 Unspecified lesions of oral mucosa: Secondary | ICD-10-CM | POA: Diagnosis not present

## 2016-06-15 MED ORDER — NYSTATIN 100000 UNIT/ML MT SUSP: 5.0000 mL | Freq: Four times a day (QID) | OROMUCOSAL | 0 refills | Status: DC

## 2016-06-15 NOTE — Progress Notes (Signed)
Subjective:    Patient ID: Hannah Vasquez, female    DOB: 1951/02/21, 65 y.o.   MRN: [id number]  HPI  Pt presents to the clinic today with c/o tongue discomfort, discomfort with swallowing, dry mouth and bad odor to breath. She reports this started 4 days ago. She noticed a white coating on her tongue yesterday that she was not able to get off by brushing her tongue and using Listerine. She denies runny nose, ear pain or cough. She denies fever, chills or body aches. She has not had sick contacts that she is aware of. She did recently get her flu shot and not sure if this contributed to it or not.  Review of Systems  Past Medical History:  Diagnosis Date  . Allergy   . Anxiety   . CAD (coronary artery disease)   . Hemorrhoids   . Hypertension   . Mitral valve prolapse   . Osteoporosis     Current Outpatient Prescriptions  Medication Sig Dispense Refill  . aspirin EC 81 MG tablet Take 81 mg by mouth every morning.     . Calcium Carbonate-Vit D-Min 600-400 MG-UNIT TABS Take 1 tablet by mouth every morning.     . Cholecalciferol (VITAMIN D3) 1000 UNITS CAPS Take 1 capsule by mouth every morning.     Marland Kitchen desonide (DESOWEN) 0.05 % cream Apply topically 2 (two) times daily.    . fluocinonide-emollient (LIDEX-E) 0.05 % cream Apply 1 application topically daily as needed (to legs for rash). 30 g 2  . fluticasone (FLONASE) 50 MCG/ACT nasal spray Place 2 sprays into both nostrils as needed.    . hydrochlorothiazide (HYDRODIURIL) 12.5 MG tablet TAKE ONE TABLET BY MOUTH EVERY MORNING 90 tablet 1  . ketoconazole (NIZORAL) 2 % cream Apply 1 application topically daily.    Marland Kitchen loratadine (CLARITIN) 10 MG tablet Take 1 tablet (10 mg total) by mouth every morning. 30 tablet 2  . meclizine (ANTIVERT) 25 MG tablet Take 1 tablet (25 mg total) by mouth every 4 (four) hours as needed for dizziness. 60 tablet 0  . metoprolol succinate (TOPROL-XL) 50 MG 24 hr tablet Take 1 tablet (50 mg total) by mouth daily.  Take with or immediately following a meal. 90 tablet 3  . Multiple Vitamin (MULTIVITAMIN) tablet Take 1 tablet by mouth every morning.     . naproxen (NAPROSYN) 500 MG tablet Take 1 tablet (500 mg total) by mouth 2 (two) times daily with a meal. 60 tablet 0  . Omega-3 Fatty Acids (FISH OIL) 1200 MG CAPS Take 1 capsule by mouth every morning.     . ondansetron (ZOFRAN ODT) 4 MG disintegrating tablet Take 1 tablet (4 mg total) by mouth every 8 (eight) hours as needed for nausea or vomiting. 20 tablet 0  . ondansetron (ZOFRAN) 4 MG tablet Take 1 tablet (4 mg total) by mouth every 8 (eight) hours as needed for nausea or vomiting. 20 tablet 0  . pregabalin (LYRICA) 75 MG capsule Take 1 capsule (75 mg total) by mouth 2 (two) times daily. 60 capsule 0  . PROAIR HFA 108 (90 Base) MCG/ACT inhaler INHALE 2 PUFFS INTO THE LUNGS EVERY 4 HOURS AS NEEDED 8.5 g 5  . omeprazole (PRILOSEC) 40 MG capsule      No current facility-administered medications for this visit.     Allergies  Allergen Reactions  . Codeine     REACTION: nausea and vomiting  . Penicillins     REACTION: rash  Family History  Problem Relation Age of Onset  . Mitral valve prolapse Mother   . Hypertension Father     Social History   Social History  . Marital status: Divorced    Spouse name: N/A  . Number of children: 2  . Years of education: N/A   Occupational History  . Medical office---front desk     Retired   Social History Main Topics  . Smoking status: Never Smoker  . Smokeless tobacco: Never Used  . Alcohol use Not on file  . Drug use: Unknown  . Sexual activity: Not on file   Other Topics Concern  . Not on file   Social History Narrative   Has a living will.   HPOA- daughter.   She would desire CPR.  Would desire life support if necessary and not futile.           Constitutional: Denies fever, malaise, fatigue, headache or abrupt weight changes.  HEENT: Pt reports mouth discomfort. Denies eye pain,  eye redness, ear pain, ringing in the ears, wax buildup, runny nose, nasal congestion, bloody nose, or sore throat. Respiratory: Denies difficulty breathing, shortness of breath, cough or sputum production.    No other specific complaints in a complete review of systems (except as listed in HPI above).     Objective:   Physical Exam  BP 124/82   Pulse (!) 58   Temp 98.4 F (36.9 C) (Oral)   Wt 158 lb 8 oz (71.9 kg)   SpO2 97%   BMI 28.99 kg/m  Wt Readings from Last 3 Encounters:  06/15/16 158 lb 8 oz (71.9 kg)  04/26/16 156 lb (70.8 kg)  04/15/16 157 lb 12.8 oz (71.6 kg)    General: Appears her stated age, well developed, well nourished in NAD. HEENT: Throat/Mouth: Teeth present, mucosa pink but dry. Small scattered white patches noted on tongue.  Neck:  No adenopathy noted. Pulmonary/Chest: Normal effort and positive vesicular breath sounds. No respiratory distress. No wheezes, rales or ronchi noted.   BMET    Component Value Date/Time   NA 143 09/16/2015 0828   K 3.8 09/16/2015 0828   CL 106 09/16/2015 0828   CO2 31 09/16/2015 0828   GLUCOSE 98 09/16/2015 0828   BUN 15 09/16/2015 0828   CREATININE 0.74 09/16/2015 0828   CALCIUM 9.7 09/16/2015 0828   GFRNONAA 74 (L) 12/01/2012 1425   GFRAA 86 (L) 12/01/2012 1425    Lipid Panel     Component Value Date/Time   CHOL 178 09/16/2015 0828   TRIG 188.0 (H) 09/16/2015 0828   HDL 31.50 (L) 09/16/2015 0828   CHOLHDL 6 09/16/2015 0828   VLDL 37.6 09/16/2015 0828   LDLCALC 109 (H) 09/16/2015 0828    CBC    Component Value Date/Time   WBC 8.7 09/16/2015 0828   RBC 5.18 (H) 09/16/2015 0828   HGB 14.0 09/16/2015 0828   HCT 42.3 09/16/2015 0828   PLT 309.0 09/16/2015 0828   MCV 81.6 09/16/2015 0828   MCH 28.3 12/01/2012 1425   MCHC 33.1 09/16/2015 0828   RDW 14.0 09/16/2015 0828   LYMPHSABS 3.0 09/16/2015 0828   MONOABS 0.5 09/16/2015 0828   EOSABS 0.3 09/16/2015 0828   BASOSABS 0.0 09/16/2015 0828    Hgb  A1C No results found for: HGBA1C       Assessment & Plan:   Mouth discomfort, likely oral thrush:  eRx for Nystatin swish and swallow 5 ml's 4 x day Drink plenty of  water  RTC as needed or if symptoms persist or worsen BAITY, REGINA, NP

## 2016-06-15 NOTE — Patient Instructions (Signed)
Thrush, Adult  Thrush, also called oral candidiasis, is a fungal infection that develops in the mouth and throat and on the tongue. It causes white patches to form on the mouth and tongue. Thrush is most common in older adults, but it can occur at any age.   Many cases of thrush are mild, but this infection can also be more serious. Thrush can be a recurring problem for people who have chronic illnesses or who take medicines that limit the body's ability to fight infection. Because these people have difficulty fighting infections, the fungus that causes thrush can spread throughout the body. This can cause life-threatening blood or organ infections.  CAUSES   Thrush is usually caused by a yeast called Candida albicans. This fungus is normally present in small amounts in the mouth and on other mucous membranes. It usually causes no harm. However, when conditions are present that allow the fungus to grow uncontrolled, it invades surrounding tissues and becomes an infection. Less often, other Candida species can also lead to thrush.   RISK FACTORS  Thrush is more likely to develop in the following people:  · People with an impaired ability to fight infection (weakened immune system).    · Older adults.    · People with HIV.    · People with diabetes.    · People with dry mouth (xerostomia).    · Pregnant women.    · People with poor dental care, especially those who have false teeth.    · People who use antibiotic medicines.    SIGNS AND SYMPTOMS   Thrush can be a mild infection that causes no symptoms. If symptoms develop, they may include:   · A burning feeling in the mouth and throat. This can occur at the start of a thrush infection.    · White patches that adhere to the mouth and tongue. The tissue around the patches may be red, raw, and painful. If rubbed (during tooth brushing, for example), the patches and the tissue of the mouth may bleed easily.    · A bad taste in the mouth or difficulty tasting foods.     · Cottony feeling in the mouth.    · Pain during eating and swallowing.  DIAGNOSIS   Your health care provider can usually diagnose thrush by looking in your mouth and asking you questions about your health.   TREATMENT   Medicines that help prevent the growth of fungi (antifungals) are the standard treatment for thrush. These medicines are either applied directly to the affected area (topical) or swallowed (oral). The treatment will depend on the severity of the condition.   Mild Thrush  Mild cases of thrush may clear up with the use of an antifungal mouth rinse or lozenges. Treatment usually lasts about 14 days.   Moderate to Severe Thrush  · More severe thrush infections that have spread to the esophagus are treated with an oral antifungal medicine. A topical antifungal medicine may also be used.    · For some severe infections, a treatment period longer than 14 days may be needed.    · Oral antifungal medicines are almost never used during pregnancy because the fetus may be harmed. However, if a pregnant woman has a rare, severe thrush infection that has spread to her blood, oral antifungal medicines may be used. In this case, the risk of harm to the mother and fetus from the severe thrush infection may be greater than the risk posed by the use of antifungal medicines.    Persistent or Recurrent Thrush  For cases of   thrush that do not go away or keep coming back, treatment may involve the following:   · Treatment may be needed twice as long as the symptoms last.    · Treatment will include both oral and topical antifungal medicines.    · People with weakened immune systems can take an antifungal medicine on a continuous basis to prevent thrush infections.    It is important to treat conditions that make you more likely to get thrush, such as diabetes or HIV.   HOME CARE INSTRUCTIONS   · Only take over-the-counter or prescription medicine as directed by your health care provider. Talk to your health care  provider about an over-the-counter medicine called gentian violet, which kills bacteria and fungi.    · Eat plain, unflavored yogurt as directed by your health care provider. Check the label to make sure the yogurt contains live cultures. This yogurt can help healthy bacteria grow in the mouth that can stop the growth of the fungus that causes thrush.    · Try these measures to help reduce the discomfort of thrush:      Drink cold liquids such as water or iced tea.      Try flavored ice treats or frozen juices.      Eat foods that are easy to swallow, such as gelatin, ice cream, or custard.      If the patches in your mouth are painful, try drinking from a straw.    · Rinse your mouth several times a day with a warm saltwater rinse. You can make the saltwater mixture with 1 tsp (6 g) of salt in 8 fl oz (0.2 L) of warm water.    · If you wear dentures, remove the dentures before going to bed, brush them vigorously, and soak them in a cleaning solution as directed by your health care provider.    · Women who are breastfeeding should clean their nipples with an antifungal medicine as directed by their health care provider. Dry the nipples after breastfeeding. Applying lanolin-containing body lotion may help relieve nipple soreness.    SEEK MEDICAL CARE IF:  · Your symptoms are getting worse or are not improving within 7 days of starting treatment.    · You have symptoms of spreading infection, such as white patches on the skin outside of the mouth.    · You are nursing and you have redness, burning, or pain in the nipples that is not relieved with treatment.    MAKE SURE YOU:  · Understand these instructions.  · Will watch your condition.  · Will get help right away if you are not doing well or get worse.     This information is not intended to replace advice given to you by your health care provider. Make sure you discuss any questions you have with your health care provider.     Document Released: 04/27/2004 Document  Revised: 08/23/2014 Document Reviewed: 03/05/2013  Elsevier Interactive Patient Education ©2016 [employer]

## 2016-06-20 HISTORY — PX: DENTAL SURGERY: SHX609

## 2016-07-07 ENCOUNTER — Telehealth: Payer: Self-pay | Admitting: Family Medicine

## 2016-07-07 NOTE — Telephone Encounter (Signed)
[place] faxed a note to [facility]; did not put note to my desktop or int [facility] portal. Placed note in Dr Deborra Medina in box; content was pt wanted to schedule appt; using inhaler more often; wants to talk with Dr Deborra Medina about breathing. Her legs are getting weaker and has occasional wheezing. Pt has appt with Dr Deborra Medina on 07/12/16 at 12noon.

## 2016-07-07 NOTE — Telephone Encounter (Signed)
I spoke with pt and she made appointment see below reason.  I asked pt twice if she spoke to [facility] about the problems she was having.  She stated she did   heavy breathing wheezing weak legs discuss meds/rbh pt spoke to [facility] and they sent her back to make appointment/rbh [phone]

## 2016-07-12 ENCOUNTER — Ambulatory Visit (INDEPENDENT_AMBULATORY_CARE_PROVIDER_SITE_OTHER): Payer: PPO | Admitting: Family Medicine

## 2016-07-12 ENCOUNTER — Encounter: Payer: Self-pay | Admitting: Family Medicine

## 2016-07-12 DIAGNOSIS — R531 Weakness: Secondary | ICD-10-CM | POA: Diagnosis not present

## 2016-07-12 LAB — CBC WITH DIFFERENTIAL/PLATELET
BASOS ABS: 0.1 10*3/uL (ref 0.0–0.1)
Basophils Relative: 0.5 % (ref 0.0–3.0)
Eosinophils Absolute: 0.3 10*3/uL (ref 0.0–0.7)
Eosinophils Relative: 2.7 % (ref 0.0–5.0)
HEMATOCRIT: 41.9 % (ref 36.0–46.0)
Hemoglobin: 14 g/dL (ref 12.0–15.0)
LYMPHS PCT: 32 % (ref 12.0–46.0)
Lymphs Abs: 3.5 10*3/uL (ref 0.7–4.0)
MCHC: 33.3 g/dL (ref 30.0–36.0)
MCV: 80.6 fl (ref 78.0–100.0)
MONOS PCT: 5.9 % (ref 3.0–12.0)
Monocytes Absolute: 0.7 10*3/uL (ref 0.1–1.0)
Neutro Abs: 6.5 10*3/uL (ref 1.4–7.7)
Neutrophils Relative %: 58.9 % (ref 43.0–77.0)
Platelets: 314 10*3/uL (ref 150.0–400.0)
RBC: 5.2 Mil/uL — AB (ref 3.87–5.11)
RDW: 14.3 % (ref 11.5–15.5)
WBC: 11.1 10*3/uL — ABNORMAL HIGH (ref 4.0–10.5)

## 2016-07-12 LAB — COMPREHENSIVE METABOLIC PANEL
ALK PHOS: 96 U/L (ref 39–117)
ALT: 22 U/L (ref 0–35)
AST: 25 U/L (ref 0–37)
Albumin: 4.3 g/dL (ref 3.5–5.2)
BILIRUBIN TOTAL: 0.4 mg/dL (ref 0.2–1.2)
BUN: 22 mg/dL (ref 6–23)
CO2: 30 meq/L (ref 19–32)
CREATININE: 0.8 mg/dL (ref 0.40–1.20)
Calcium: 9.7 mg/dL (ref 8.4–10.5)
Chloride: 104 mEq/L (ref 96–112)
GFR: 75.87 mL/min (ref >60.00)
GLUCOSE: 83 mg/dL (ref 70–99)
Potassium: 3.7 mEq/L (ref 3.5–5.1)
SODIUM: 142 meq/L (ref 135–145)
TOTAL PROTEIN: 7.6 g/dL (ref 6.0–8.3)

## 2016-07-12 LAB — VITAMIN B12: Vitamin B-12: 1407 pg/mL — ABNORMAL HIGH (ref 211–911)

## 2016-07-12 LAB — TSH: TSH: 2.72 u[IU]/mL (ref 0.35–4.50)

## 2016-07-12 NOTE — Progress Notes (Signed)
Pre visit review using our clinic review tool, if applicable. No additional management support is needed unless otherwise documented below in the visit note. 

## 2016-07-12 NOTE — Progress Notes (Signed)
SUBJECTIVE:  Hannah Vasquez is a 65 y.o. female who complains of wheezing and SOB for 5 days. She denies a history of anorexia and chest pain and admits to a history of asthma. Patient denies smoke cigarettes.  She also complains of feeling weak for the past couple of months. Lab Results  Component Value Date   WBC 8.7 09/16/2015   HGB 14.0 09/16/2015   HCT 42.3 09/16/2015   MCV 81.6 09/16/2015   PLT 309.0 09/16/2015      Current Outpatient Prescriptions on File Prior to Visit  Medication Sig Dispense Refill  . aspirin EC 81 MG tablet Take 81 mg by mouth every morning.     . Calcium Carbonate-Vit D-Min 600-400 MG-UNIT TABS Take 1 tablet by mouth every morning.     . Cholecalciferol (VITAMIN D3) 1000 UNITS CAPS Take 1 capsule by mouth every morning.     Marland Kitchen desonide (DESOWEN) 0.05 % cream Apply topically 2 (two) times daily.    . fluocinonide-emollient (LIDEX-E) 0.05 % cream Apply 1 application topically daily as needed (to legs for rash). 30 g 2  . fluticasone (FLONASE) 50 MCG/ACT nasal spray Place 2 sprays into both nostrils as needed.    . hydrochlorothiazide (HYDRODIURIL) 12.5 MG tablet TAKE ONE TABLET BY MOUTH EVERY MORNING 90 tablet 1  . ketoconazole (NIZORAL) 2 % cream Apply 1 application topically daily.    Marland Kitchen loratadine (CLARITIN) 10 MG tablet Take 1 tablet (10 mg total) by mouth every morning. 30 tablet 2  . meclizine (ANTIVERT) 25 MG tablet Take 1 tablet (25 mg total) by mouth every 4 (four) hours as needed for dizziness. 60 tablet 0  . metoprolol succinate (TOPROL-XL) 50 MG 24 hr tablet Take 1 tablet (50 mg total) by mouth daily. Take with or immediately following a meal. 90 tablet 3  . Multiple Vitamin (MULTIVITAMIN) tablet Take 1 tablet by mouth every morning.     . naproxen (NAPROSYN) 500 MG tablet Take 1 tablet (500 mg total) by mouth 2 (two) times daily with a meal. 60 tablet 0  . nystatin (MYCOSTATIN) 100000 UNIT/ML suspension Take 5 mLs (500,000 Units total) by mouth 4  (four) times daily. 100 mL 0  . Omega-3 Fatty Acids (FISH OIL) 1200 MG CAPS Take 1 capsule by mouth every morning.     Marland Kitchen omeprazole (PRILOSEC) 40 MG capsule     . pregabalin (LYRICA) 75 MG capsule Take 1 capsule (75 mg total) by mouth 2 (two) times daily. 60 capsule 0  . PROAIR HFA 108 (90 Base) MCG/ACT inhaler INHALE 2 PUFFS INTO THE LUNGS EVERY 4 HOURS AS NEEDED 8.5 g 5   No current facility-administered medications on file prior to visit.     Allergies  Allergen Reactions  . Codeine     REACTION: nausea and vomiting  . Penicillins     REACTION: rash    Past Medical History:  Diagnosis Date  . Allergy   . Anxiety   . CAD (coronary artery disease)   . Hemorrhoids   . Hypertension   . Mitral valve prolapse   . Osteoporosis     Past Surgical History:  Procedure Laterality Date  . BLADDER SURGERY  11/15/1995   bladder neck suspension, urinary incontinence  . KNEE SURGERY  2003   torn meniscus  . MOHS SURGERY Right 2017   right side of face  . SHOULDER SURGERY Right 2005    Family History  Problem Relation Age of Onset  . Mitral valve  prolapse Mother   . Hypertension Father     Social History   Social History  . Marital status: Divorced    Spouse name: N/A  . Number of children: 2  . Years of education: N/A   Occupational History  . Medical office---front desk     Retired   Social History Main Topics  . Smoking status: Never Smoker  . Smokeless tobacco: Never Used  . Alcohol use Not on file  . Drug use: Unknown  . Sexual activity: Not on file   Other Topics Concern  . Not on file   Social History Narrative   Has a living will.   HPOA- daughter.   She would desire CPR.  Would desire life support if necessary and not futile.         The PMH, PSH, Social History, Family History, Medications, and allergies have been reviewed in [facility], and have been updated if relevant.  OBJECTIVE: BP (!) 146/82   Pulse 61   Temp 97.8 F (36.6 C) (Oral)   Wt 162  lb 4 oz (73.6 kg)   SpO2 98%   BMI 29.68 kg/m   She appears well, vital signs are as noted. Ears normal.  Throat and pharynx normal.  Neck supple. No adenopathy in the neck. Nose is congested. Sinuses non tender. The chest is clear, without wheezes or rales.

## 2016-07-12 NOTE — Assessment & Plan Note (Signed)
With SOB. Lungs clear on exam, o2 sats and other VSS reassuring. Will check labs for further evaluation. The patient indicates understanding of these issues and agrees with the plan. Orders Placed This Encounter  Procedures  . CBC with Differential/Platelet  . Vitamin B12  . TSH  . Comprehensive metabolic panel

## 2016-07-14 ENCOUNTER — Encounter: Payer: Self-pay | Admitting: *Deleted

## 2016-07-14 ENCOUNTER — Other Ambulatory Visit: Payer: Self-pay | Admitting: *Deleted

## 2016-07-14 MED ORDER — PREGABALIN 75 MG PO CAPS: 75.0000 mg | ORAL_CAPSULE | Freq: Two times a day (BID) | ORAL | 0 refills | Status: DC

## 2016-07-14 NOTE — Telephone Encounter (Signed)
Rx faxed to requested pharmacy. Lyrica cannot be sent electronically

## 2016-07-14 NOTE — Telephone Encounter (Signed)
Pt had CPE in 08/2015, but anxiety has not been discussed, per chart, since 11/2014

## 2016-07-14 NOTE — Telephone Encounter (Signed)
Sent electronically 

## 2016-07-15 DIAGNOSIS — D225 Melanocytic nevi of trunk: Secondary | ICD-10-CM | POA: Diagnosis not present

## 2016-07-15 DIAGNOSIS — L918 Other hypertrophic disorders of the skin: Secondary | ICD-10-CM | POA: Diagnosis not present

## 2016-07-15 DIAGNOSIS — D2239 Melanocytic nevi of other parts of face: Secondary | ICD-10-CM | POA: Diagnosis not present

## 2016-07-15 DIAGNOSIS — D1801 Hemangioma of skin and subcutaneous tissue: Secondary | ICD-10-CM | POA: Diagnosis not present

## 2016-07-15 DIAGNOSIS — L720 Epidermal cyst: Secondary | ICD-10-CM | POA: Diagnosis not present

## 2016-07-15 DIAGNOSIS — Z85828 Personal history of other malignant neoplasm of skin: Secondary | ICD-10-CM | POA: Diagnosis not present

## 2016-07-15 DIAGNOSIS — L821 Other seborrheic keratosis: Secondary | ICD-10-CM | POA: Diagnosis not present

## 2016-08-06 ENCOUNTER — Other Ambulatory Visit: Payer: Self-pay | Admitting: Family Medicine

## 2016-08-19 ENCOUNTER — Other Ambulatory Visit: Payer: Self-pay | Admitting: Family Medicine

## 2016-08-20 ENCOUNTER — Other Ambulatory Visit: Payer: Self-pay | Admitting: Family Medicine

## 2016-08-20 NOTE — Telephone Encounter (Signed)
CALLED IN

## 2016-08-20 NOTE — Telephone Encounter (Signed)
Last refill 07/14/16, last OV 07/12/16. Ok to refill?

## 2016-08-22 ENCOUNTER — Other Ambulatory Visit: Payer: Self-pay | Admitting: Family Medicine

## 2016-09-06 ENCOUNTER — Encounter: Payer: Self-pay | Admitting: Family Medicine

## 2016-09-06 ENCOUNTER — Ambulatory Visit (INDEPENDENT_AMBULATORY_CARE_PROVIDER_SITE_OTHER): Payer: PPO | Admitting: Family Medicine

## 2016-09-06 VITALS — BP 148/72 | HR 69 | Temp 99.0°F | Wt 160.5 lb

## 2016-09-06 DIAGNOSIS — J111 Influenza due to unidentified influenza virus with other respiratory manifestations: Secondary | ICD-10-CM

## 2016-09-06 MED ORDER — OSELTAMIVIR PHOSPHATE 75 MG PO CAPS: 75.0000 mg | ORAL_CAPSULE | Freq: Two times a day (BID) | ORAL | 0 refills | Status: DC

## 2016-09-06 NOTE — Progress Notes (Signed)
SUBJECTIVE:  Hannah Vasquez is a 66 y.o. female who present complaining of flu-like symptoms: fevers, chills, myalgias, congestion, sore throat and cough for 2 days. Denies dyspnea or wheezing.  Daughter has influenza A.   Current Outpatient Prescriptions on File Prior to Visit  Medication Sig Dispense Refill  . aspirin EC 81 MG tablet Take 81 mg by mouth every morning.     . Calcium Carbonate-Vit D-Min 600-400 MG-UNIT TABS Take 1 tablet by mouth every morning.     . Cholecalciferol (VITAMIN D3) 1000 UNITS CAPS Take 1 capsule by mouth every morning.     Marland Kitchen desonide (DESOWEN) 0.05 % cream Apply topically 2 (two) times daily.    . fluocinonide-emollient (LIDEX-E) 0.05 % cream Apply 1 application topically daily as needed (to legs for rash). 30 g 2  . fluticasone (FLONASE) 50 MCG/ACT nasal spray Place 2 sprays into both nostrils as needed.    . hydrochlorothiazide (MICROZIDE) 12.5 MG capsule Take 1 capsule (12.5 mg total) by mouth every morning. COMPLETE PHYSICAL EXAM REQUIRED FOR ANY ADDITIONAL REFILLS 30 capsule 0  . ketoconazole (NIZORAL) 2 % cream Apply 1 application topically daily.    Marland Kitchen loratadine (CLARITIN) 10 MG tablet Take 1 tablet (10 mg total) by mouth every morning. 30 tablet 2  . LYRICA 75 MG capsule TAKE ONE (1) CAPSULE BY MOUTH 2 TIMES DAILY 60 capsule 3  . meclizine (ANTIVERT) 25 MG tablet Take 1 tablet (25 mg total) by mouth every 4 (four) hours as needed for dizziness. 60 tablet 0  . metoprolol succinate (TOPROL-XL) 50 MG 24 hr tablet Take 1 tablet (50 mg total) by mouth daily. Take with or immediately following a meal. 90 tablet 3  . Multiple Vitamin (MULTIVITAMIN) tablet Take 1 tablet by mouth every morning.     . naproxen (NAPROSYN) 500 MG tablet TAKE 1 TABLET BY MOUTH TWICE A DAY WITH A MEAL. 60 tablet 3  . nystatin (MYCOSTATIN) 100000 UNIT/ML suspension Take 5 mLs (500,000 Units total) by mouth 4 (four) times daily. 100 mL 0  . Omega-3 Fatty Acids (FISH OIL) 1200 MG CAPS Take  1 capsule by mouth every morning.     Marland Kitchen omeprazole (PRILOSEC) 40 MG capsule     . PROAIR HFA 108 (90 Base) MCG/ACT inhaler INHALE 2 PUFFS INTO THE LUNGS EVERY 4 HOURS AS NEEDED 8.5 g 5   No current facility-administered medications on file prior to visit.     Allergies  Allergen Reactions  . Codeine     REACTION: nausea and vomiting  . Penicillins     REACTION: rash    Past Medical History:  Diagnosis Date  . Allergy   . Anxiety   . CAD (coronary artery disease)   . Hemorrhoids   . Hypertension   . Mitral valve prolapse   . Osteoporosis     Past Surgical History:  Procedure Laterality Date  . BLADDER SURGERY  11/15/1995   bladder neck suspension, urinary incontinence  . KNEE SURGERY  2003   torn meniscus  . MOHS SURGERY Right 2017   right side of face  . SHOULDER SURGERY Right 2005    Family History  Problem Relation Age of Onset  . Mitral valve prolapse Mother   . Hypertension Father     Social History   Social History  . Marital status: Divorced    Spouse name: N/A  . Number of children: 2  . Years of education: N/A   Occupational History  . Medical  office---front desk     Retired   Social History Main Topics  . Smoking status: Never Smoker  . Smokeless tobacco: Never Used  . Alcohol use Not on file  . Drug use: Unknown  . Sexual activity: Not on file   Other Topics Concern  . Not on file   Social History Narrative   Has a living will.   HPOA- daughter.   She would desire CPR.  Would desire life support if necessary and not futile.         The PMH, PSH, Social History, Family History, Medications, and allergies have been reviewed in [facility], and have been updated if relevant.  OBJECTIVE: BP (!) 148/72   Vasquez 67   Temp 99 F (37.2 C) (Oral)   Wt 160 lb 8 oz (72.8 kg)   SpO2 98%   BMI 29.36 kg/m   Appears moderately ill but not toxic; temperature as noted in vitals. Ears normal. Throat and pharynx normal.  Neck supple. No adenopathy in  the neck. Sinuses non tender. The chest is clear.  ASSESSMENT: Influenza  PLAN: Tamiflu 75 mg twice daily x 5 days.  Symptomatic therapy suggested: call prn if symptoms persist or worsen. Call or return to clinic prn if these symptoms worsen or fail to improve as anticipated.

## 2016-09-06 NOTE — Patient Instructions (Signed)

## 2016-09-06 NOTE — Progress Notes (Signed)
Pre visit review using our clinic review tool, if applicable. No additional management support is needed unless otherwise documented below in the visit note. 

## 2016-09-08 ENCOUNTER — Other Ambulatory Visit: Payer: Self-pay | Admitting: Family Medicine

## 2016-09-08 DIAGNOSIS — I10 Essential (primary) hypertension: Secondary | ICD-10-CM

## 2016-09-08 DIAGNOSIS — Z01419 Encounter for gynecological examination (general) (routine) without abnormal findings: Secondary | ICD-10-CM | POA: Insufficient documentation

## 2016-09-17 ENCOUNTER — Other Ambulatory Visit: Payer: PPO

## 2016-09-17 ENCOUNTER — Ambulatory Visit: Payer: PPO

## 2016-09-19 ENCOUNTER — Other Ambulatory Visit: Payer: Self-pay | Admitting: Family Medicine

## 2016-09-20 ENCOUNTER — Ambulatory Visit (INDEPENDENT_AMBULATORY_CARE_PROVIDER_SITE_OTHER): Payer: PPO | Admitting: Internal Medicine

## 2016-09-20 ENCOUNTER — Encounter: Payer: Self-pay | Admitting: Internal Medicine

## 2016-09-20 ENCOUNTER — Ambulatory Visit (INDEPENDENT_AMBULATORY_CARE_PROVIDER_SITE_OTHER): Payer: PPO

## 2016-09-20 VITALS — BP 128/82 | HR 62 | Temp 98.5°F | Ht 62.0 in | Wt 162.5 lb

## 2016-09-20 VITALS — BP 122/76 | HR 70 | Temp 97.9°F | Resp 12 | Wt 162.0 lb

## 2016-09-20 DIAGNOSIS — J0100 Acute maxillary sinusitis, unspecified: Secondary | ICD-10-CM | POA: Insufficient documentation

## 2016-09-20 DIAGNOSIS — Z01419 Encounter for gynecological examination (general) (routine) without abnormal findings: Secondary | ICD-10-CM | POA: Diagnosis not present

## 2016-09-20 DIAGNOSIS — Z0000 Encounter for general adult medical examination without abnormal findings: Secondary | ICD-10-CM

## 2016-09-20 LAB — COMPREHENSIVE METABOLIC PANEL
ALT: 21 U/L (ref 0–35)
AST: 23 U/L (ref 0–37)
Albumin: 4 g/dL (ref 3.5–5.2)
Alkaline Phosphatase: 106 U/L (ref 39–117)
BUN: 17 mg/dL (ref 6–23)
CO2: 29 meq/L (ref 19–32)
Calcium: 9.2 mg/dL (ref 8.4–10.5)
Chloride: 107 mEq/L (ref 96–112)
Creatinine, Ser: 0.81 mg/dL (ref 0.40–1.20)
GFR: 74.74 mL/min (ref >60.00)
GLUCOSE: 113 mg/dL — AB (ref 70–99)
POTASSIUM: 3.3 meq/L — AB (ref 3.5–5.1)
SODIUM: 144 meq/L (ref 135–145)
TOTAL PROTEIN: 7.4 g/dL (ref 6.0–8.3)
Total Bilirubin: 0.5 mg/dL (ref 0.2–1.2)

## 2016-09-20 LAB — LIPID PANEL
CHOL/HDL RATIO: 6
Cholesterol: 147 mg/dL (ref 0–200)
HDL: 26.3 mg/dL — AB (ref >39.00)
LDL CALC: 84 mg/dL (ref 0–99)
NONHDL: 120.31
Triglycerides: 184 mg/dL — ABNORMAL HIGH (ref 0.0–149.0)
VLDL: 36.8 mg/dL (ref 0.0–40.0)

## 2016-09-20 LAB — CBC WITH DIFFERENTIAL/PLATELET
BASOS ABS: 0 10*3/uL (ref 0.0–0.1)
Basophils Relative: 0.4 % (ref 0.0–3.0)
EOS PCT: 2.5 % (ref 0.0–5.0)
Eosinophils Absolute: 0.3 10*3/uL (ref 0.0–0.7)
HCT: 39.9 % (ref 36.0–46.0)
Hemoglobin: 13.5 g/dL (ref 12.0–15.0)
LYMPHS ABS: 2.9 10*3/uL (ref 0.7–4.0)
Lymphocytes Relative: 28.8 % (ref 12.0–46.0)
MCHC: 33.8 g/dL (ref 30.0–36.0)
MCV: 78.8 fl (ref 78.0–100.0)
MONO ABS: 0.7 10*3/uL (ref 0.1–1.0)
MONOS PCT: 7.1 % (ref 3.0–12.0)
NEUTROS ABS: 6.2 10*3/uL (ref 1.4–7.7)
NEUTROS PCT: 61.2 % (ref 43.0–77.0)
PLATELETS: 388 10*3/uL (ref 150.0–400.0)
RBC: 5.06 Mil/uL (ref 3.87–5.11)
RDW: 14.7 % (ref 11.5–15.5)
WBC: 10.1 10*3/uL (ref 4.0–10.5)

## 2016-09-20 LAB — TSH: TSH: 2.95 u[IU]/mL (ref 0.35–4.50)

## 2016-09-20 MED ORDER — DOXYCYCLINE HYCLATE 100 MG PO TABS: 100.0000 mg | ORAL_TABLET | Freq: Two times a day (BID) | ORAL | 0 refills | Status: DC

## 2016-09-20 NOTE — Progress Notes (Signed)
Pre visit review using our clinic review tool, if applicable. No additional management support is needed unless otherwise documented below in the visit note. 

## 2016-09-20 NOTE — Progress Notes (Signed)
Subjective:   Hannah Vasquez is a 66 y.o. female who presents for Medicare Annual (Subsequent) preventive examination.  Review of Systems:  N/A Cardiac Risk Factors include: advanced age (>85mn, >>29women);hypertension     Objective:     Vitals: BP 128/82 (BP Location: Right Arm, Patient Position: Sitting, Cuff Size: Normal)   Pulse 62   Temp 98.5 F (36.9 C) (Oral)   Ht 5' 2"  (1.575 m) Comment: no shoes  Wt 162 lb 8 oz (73.7 kg)   SpO2 96%   BMI 29.72 kg/m   Body mass index is 29.72 kg/m.   Tobacco History  Smoking Status  . Never Smoker  Smokeless Tobacco  . Never Used     Counseling given: No   Past Medical History:  Diagnosis Date  . Allergy   . Anxiety   . CAD (coronary artery disease)   . Hemorrhoids   . Hypertension   . Mitral valve prolapse   . Osteoporosis    Past Surgical History:  Procedure Laterality Date  . BASAL CELL CARCINOMA EXCISION  02/05/2016   [facility]  . BLADDER SURGERY  11/15/1995   bladder neck suspension, urinary incontinence  . CATARACT EXTRACTION Left 04/30/2016   [facility] DENT[facility]  06/20/2016   Tooth Implant; Dr. BKalman Shan . KNEE SURGERY  2003   torn meniscus  . MOHS SURGERY Right 2017   right side of face  . SHOULDER SURGERY Right 2005   Family History  Problem Relation Age of Onset  . Mitral valve prolapse Mother   . Hypertension Father    History  Sexual Activity  . Sexual activity: Yes    Outpatient Encounter Prescriptions as of 09/20/2016  Medication Sig  . aspirin EC 81 MG tablet Take 81 mg by mouth every morning.   . Calcium Carbonate-Vit D-Min 600-400 MG-UNIT TABS Take 1 tablet by mouth every morning.   . Cholecalciferol (VITAMIN D3) 1000 UNITS CAPS Take 1 capsule by mouth every morning.   .Marland Kitchendesonide (DESOWEN) 0.05 % cream Apply topically 2 (two) times daily.  . fluocinonide-emollient (LIDEX-E) 0.05 % cream Apply 1 application topically daily as needed (to  legs for rash).  . fluticasone (FLONASE) 50 MCG/ACT nasal spray Place 2 sprays into both nostrils as needed.  .Marland Kitchenketoconazole (NIZORAL) 2 % cream Apply 1 application topically daily.  .Marland Kitchenloratadine (CLARITIN) 10 MG tablet Take 1 tablet (10 mg total) by mouth every morning.  .Marland KitchenLYRICA 75 MG capsule TAKE ONE (1) CAPSULE BY MOUTH 2 TIMES DAILY  . meclizine (ANTIVERT) 25 MG tablet Take 1 tablet (25 mg total) by mouth every 4 (four) hours as needed for dizziness.  . metoprolol succinate (TOPROL-XL) 50 MG 24 hr tablet Take 1 tablet (50 mg total) by mouth daily. Take with or immediately following a meal.  . Multiple Vitamin (MULTIVITAMIN) tablet Take 1 tablet by mouth every morning.   . naproxen (NAPROSYN) 500 MG tablet TAKE 1 TABLET BY MOUTH TWICE A DAY WITH A MEAL.  .Marland Kitchennystatin (MYCOSTATIN) 100000 UNIT/ML suspension Take 5 mLs (500,000 Units total) by mouth 4 (four) times daily.  . Omega-3 Fatty Acids (FISH OIL) 1200 MG CAPS Take 1 capsule by mouth every morning.   .Marland Kitchenomeprazole (PRILOSEC) 40 MG capsule   . PROAIR HFA 108 (90 Base) MCG/ACT inhaler INHALE 2 PUFFS INTO THE LUNGS EVERY 4 HOURS AS NEEDED  . [DISCONTINUED] hydrochlorothiazide (MICROZIDE) 12.5 MG capsule Take 1 capsule (12.5  mg total) by mouth every morning. COMPLETE PHYSICAL EXAM REQUIRED FOR ANY ADDITIONAL REFILLS  . [DISCONTINUED] oseltamivir (TAMIFLU) 75 MG capsule Take 1 capsule (75 mg total) by mouth 2 (two) times daily.   No facility-administered encounter medications on file as of 09/20/2016.     Activities of Daily Living In your present state of health, do you have any difficulty performing the following activities: 09/20/2016  Hearing? N  Vision? N  Difficulty concentrating or making decisions? N  Walking or climbing stairs? N  Dressing or bathing? N  Doing errands, shopping? N  Preparing Food and eating ? N  Using the Toilet? N  In the past six months, have you accidently leaked urine? N  Do you have problems with loss of bowel  control? N  Managing your Medications? N  Managing your Finances? N  Housekeeping or managing your Housekeeping? N  Some recent data might be hidden    Patient Care Team: Lucille Passy, MD as PCP - General Jerline Pain, MD as Consulting Physician (Cardiology) Darleen Crocker, MD as Consulting Physician (Ophthalmology) Thelma Comp, OD as Consulting Physician (Optometry) Griselda Miner, MD as Consulting Physician (Dermatology) Renee Pain, DDS as Consulting Physician (Dentistry)    Assessment:     Hearing Screening   125Hz  250Hz  500Hz  1000Hz  2000Hz  3000Hz  4000Hz  6000Hz  8000Hz   Right ear:   40 0 0  40    Left ear:   40 0 0  40    Vision Screening Comments: Last vision exam in Nov 2017  @ [facility]   Exercise Activities and Dietary recommendations Current Exercise Habits: Home exercise routine, Type of exercise: walking, Time (Minutes): 30, Frequency (Times/Week): 3, Weekly Exercise (Minutes/Week): 90, Intensity: Mild, Exercise limited by: None identified  Goals    . Increase physical activity          When weather permits, I will resume walking 30-60 min 2-3 days per week.       Fall Risk Fall Risk  09/20/2016 09/16/2015 11/27/2014 11/27/2014  Falls in the past year? No Yes No No  Number falls in past yr: - 2 or more - -  Injury with Fall? - No - -   Depression Screen PHQ 2/9 Scores 09/20/2016 09/16/2015 11/27/2014 11/27/2014  PHQ - 2 Score 0 0 0 0     Cognitive Function MMSE - Mini Mental State Exam 09/20/2016  Orientation to time 5  Orientation to Place 5  Registration 3  Attention/ Calculation 0  Recall 3  Language- name 2 objects 0  Language- repeat 1  Language- follow 3 step command 3  Language- read & follow direction 0  Write a sentence 0  Copy design 0  Total score 20     PLEASE NOTE: A Mini-Cog screen was completed. Maximum score is 20. A value of 0 denotes this part of Folstein MMSE was not completed or the patient failed this part of the  Mini-Cog screening.   Mini-Cog Screening Orientation to Time - Max 5 pts Orientation to Place - Max 5 pts Registration - Max 3 pts Recall - Max 3 pts Language Repeat - Max 1 pts Language Follow 3 Step Command - Max 3 pts     Immunization History  Administered Date(s) Administered  . Influenza Split 05/27/2011  . Influenza Whole 06/04/2009  . Influenza,inj,Quad PF,36+ Mos 06/07/2013, 06/12/2014, 05/09/2015, 06/11/2016  . Pneumococcal Conjugate-13 11/27/2014  . Pneumococcal Polysaccharide-23 11/08/2013  . Td 08/16/1993  . Tdap 09/09/2011  .  Zoster 11/19/2009   Screening Tests Health Maintenance  Topic Date Due  . COLONOSCOPY  09/07/2026 (Originally 09/08/2016)  . MAMMOGRAM  04/12/2017  . TETANUS/TDAP  09/08/2021  . INFLUENZA VACCINE  Completed  . DEXA SCAN  Completed  . ZOSTAVAX  Completed  . Hepatitis C Screening  Completed  . PNA vac Low Risk Adult  Completed      Plan:     I have personally reviewed and addressed the Medicare Annual Wellness questionnaire and have noted the following in the patient's chart:  A. Medical and social history B. Use of alcohol, tobacco or illicit drugs  C. Current medications and supplements D. Functional ability and status E.  Nutritional status F.  Physical activity G. Advance directives H. List of other physicians I.  Hospitalizations, surgeries, and ER visits in previous 12 months J.  [place] to include hearing, vision, cognitive, depression L. Referrals and appointments - none  In addition, I have reviewed and discussed with patient certain preventive protocols, quality metrics, and best practice recommendations. A written personalized care plan for preventive services as well as general preventive health recommendations were provided to patient.  See attached scanned questionnaire for additional information.   Signed,   Lindell Noe, MHA, BS, LPN Health Coach

## 2016-09-20 NOTE — Progress Notes (Signed)
I reviewed health advisor's note, was available for consultation, and agree with documentation and plan.  

## 2016-09-20 NOTE — Patient Instructions (Signed)
Please start the antibiotic only if you are worsening in the next few days.

## 2016-09-20 NOTE — Progress Notes (Signed)
Subjective:    Patient ID: Hannah Vasquez, female    DOB: Mar 28, 1951, 66 y.o.   MRN: [id number]  HPI Here due to persistent respiratory symptoms Seen ~2 weeks ago for influenza Took the tamiflu-- did finally feel a little better early last week  Then started feeling worse later in the week Continuous headache Nausea and then diarrhea--no vomiting 1 day with frequent loose stools (2 days ago)--this has improved No one else at home with GI symptoms Feels congestion in chest--- bringing up some green sputum still  Low grade fever---feels warm at times No chills or sweats---or rare No SOB No sore throat or ear pain--- but ears feel full  Current Outpatient Prescriptions on File Prior to Visit  Medication Sig Dispense Refill  . aspirin EC 81 MG tablet Take 81 mg by mouth every morning.     . Calcium Carbonate-Vit D-Min 600-400 MG-UNIT TABS Take 1 tablet by mouth every morning.     . Cholecalciferol (VITAMIN D3) 1000 UNITS CAPS Take 1 capsule by mouth every morning.     Marland Kitchen desonide (DESOWEN) 0.05 % cream Apply topically 2 (two) times daily.    . fluocinonide-emollient (LIDEX-E) 0.05 % cream Apply 1 application topically daily as needed (to legs for rash). 30 g 2  . fluticasone (FLONASE) 50 MCG/ACT nasal spray Place 2 sprays into both nostrils as needed.    . hydrochlorothiazide (MICROZIDE) 12.5 MG capsule TAKE ONE CAPSULE BY MOUTH EVERY MORNING 90 capsule 2  . ketoconazole (NIZORAL) 2 % cream Apply 1 application topically daily.    Marland Kitchen loratadine (CLARITIN) 10 MG tablet Take 1 tablet (10 mg total) by mouth every morning. 30 tablet 2  . LYRICA 75 MG capsule TAKE ONE (1) CAPSULE BY MOUTH 2 TIMES DAILY 60 capsule 3  . meclizine (ANTIVERT) 25 MG tablet Take 1 tablet (25 mg total) by mouth every 4 (four) hours as needed for dizziness. 60 tablet 0  . metoprolol succinate (TOPROL-XL) 50 MG 24 hr tablet Take 1 tablet (50 mg total) by mouth daily. Take with or immediately following a meal. 90 tablet  3  . Multiple Vitamin (MULTIVITAMIN) tablet Take 1 tablet by mouth every morning.     . naproxen (NAPROSYN) 500 MG tablet TAKE 1 TABLET BY MOUTH TWICE A DAY WITH A MEAL. 60 tablet 3  . nystatin (MYCOSTATIN) 100000 UNIT/ML suspension Take 5 mLs (500,000 Units total) by mouth 4 (four) times daily. 100 mL 0  . Omega-3 Fatty Acids (FISH OIL) 1200 MG CAPS Take 1 capsule by mouth every morning.     Marland Kitchen omeprazole (PRILOSEC) 40 MG capsule     . oseltamivir (TAMIFLU) 75 MG capsule Take 1 capsule (75 mg total) by mouth 2 (two) times daily. 10 capsule 0  . PROAIR HFA 108 (90 Base) MCG/ACT inhaler INHALE 2 PUFFS INTO THE LUNGS EVERY 4 HOURS AS NEEDED 8.5 g 5   No current facility-administered medications on file prior to visit.     Allergies  Allergen Reactions  . Codeine     REACTION: nausea and vomiting  . Penicillins     REACTION: rash    Past Medical History:  Diagnosis Date  . Allergy   . Anxiety   . CAD (coronary artery disease)   . Hemorrhoids   . Hypertension   . Mitral valve prolapse   . Osteoporosis     Past Surgical History:  Procedure Laterality Date  . BLADDER SURGERY  11/15/1995   bladder neck suspension, urinary  incontinence  . KNEE SURGERY  2003   torn meniscus  . MOHS SURGERY Right 2017   right side of face  . SHOULDER SURGERY Right 2005    Family History  Problem Relation Age of Onset  . Mitral valve prolapse Mother   . Hypertension Father     Social History   Social History  . Marital status: Divorced    Spouse name: N/A  . Number of children: 2  . Years of education: N/A   Occupational History  . Medical office---front desk     Retired   Social History Main Topics  . Smoking status: Never Smoker  . Smokeless tobacco: Never Used  . Alcohol use Not on file  . Drug use: Unknown  . Sexual activity: Yes   Other Topics Concern  . Not on file   Social History Narrative   Has a living will.   HPOA- daughter.   She would desire CPR.  Would desire  life support if necessary and not futile.         Review of Systems  Eating but appetite is off No rash Has used the proair a few times recently---for wheezing Some left maxillary pressure Notes some post nasal drip     Objective:   Physical Exam  Constitutional: She appears well-nourished. No distress.  HENT:  Mouth/Throat: Oropharynx is clear and moist. No oropharyngeal exudate.  No sinus tenderness TMs normal Moderate nasal inflammation  Neck: Neck supple. No thyromegaly present.  Pulmonary/Chest: Effort normal and breath sounds normal. No respiratory distress. She has no wheezes. She has no rales.  Lymphadenopathy:    She has no cervical adenopathy.          Assessment & Plan:

## 2016-09-20 NOTE — Progress Notes (Signed)
PCP notes:   Health maintenance:  Colonscopy - addressed; future appt scheduled per patient  Abnormal screenings:   Hearing - failed  Patient concerns:   Pt reports continued respiratory concerns after acquiring flu approx. 2 wks ago. Pt was seen 09/06/16 by PCP.   Pt also reports increased episodes of diarrhea.  PCP notified. No availability today for acute visits.   Pt agreed to see another provider in office. Same day appt scheduled with Dr. Silvio Pate @ 1045.   Nurse concerns:  None   Next PCP appt:   None; scheduled CPE for 09/21/16 but pt cancelled appt at this time.

## 2016-09-20 NOTE — Assessment & Plan Note (Signed)
Probably the reason for the persistent cough, etc Better than before then Main new illness was apparent GI bug this weekend  Discussed supportive care Start doxy only if worsening in the next few days

## 2016-09-20 NOTE — Patient Instructions (Addendum)
Ms. Blystone , Thank you for taking time to come for your Medicare Wellness Visit. I appreciate your ongoing commitment to your health goals. Please review the following plan we discussed and let me know if I can assist you in the future.   These are the goals we discussed: Goals    . Increase physical activity          When weather permits, I will resume walking 30-60 min 2-3 days per week.        This is a list of the screening recommended for you and due dates:  Health Maintenance  Topic Date Due  . Colon Cancer Screening  09/07/2026*  . Mammogram  04/12/2017  . Tetanus Vaccine  09/08/2021  . Flu Shot  Completed  . DEXA scan (bone density measurement)  Completed  . Shingles Vaccine  Completed  .  Hepatitis C: One time screening is recommended by Center for Disease Control  (CDC) for  adults born from [birth year] through 1967.   Completed  . Pneumonia vaccines  Completed  *Topic was postponed. The date shown is not the original due date.   Preventive Care for Adults  A healthy lifestyle and preventive care can promote health and wellness. Preventive health guidelines for adults include the following key practices.  . A routine yearly physical is a good way to check with your health care provider about your health and preventive screening. It is a chance to share any concerns and updates on your health and to receive a thorough exam.  . Visit your dentist for a routine exam and preventive care every 6 months. Brush your teeth twice a day and floss once a day. Good oral hygiene prevents tooth decay and gum disease.  . The frequency of eye exams is based on your age, health, family medical history, use  of contact lenses, and other factors. Follow your health care provider's ecommendations for frequency of eye exams.  . Eat a healthy diet. Foods like vegetables, fruits, whole grains, low-fat dairy products, and lean protein foods contain the nutrients you need without too many calories.  Decrease your intake of foods high in solid fats, added sugars, and salt. Eat the right amount of calories for you. Get information about a proper diet from your health care provider, if necessary.  . Regular physical exercise is one of the most important things you can do for your health. Most adults should get at least 150 minutes of moderate-intensity exercise (any activity that increases your heart rate and causes you to sweat) each week. In addition, most adults need muscle-strengthening exercises on 2 or more days a week.  Silver Sneakers may be a benefit available to you. To determine eligibility, you may visit the website: www.silversneakers.com or contact program at [phone] Mon-Fri between 8AM-8PM.   . Maintain a healthy weight. The body mass index (BMI) is a screening tool to identify possible weight problems. It provides an estimate of body fat based on height and weight. Your health care provider can find your BMI and can help you achieve or maintain a healthy weight.   For adults 20 years and older: ? A BMI below 18.5 is considered underweight. ? A BMI of 18.5 to 24.9 is normal. ? A BMI of 25 to 29.9 is considered overweight. ? A BMI of 30 and above is considered obese.   . Maintain normal blood lipids and cholesterol levels by exercising and minimizing your intake of saturated fat. Eat a balanced diet  with plenty of fruit and vegetables. Blood tests for lipids and cholesterol should begin at age 65 and be repeated every 5 years. If your lipid or cholesterol levels are high, you are over 50, or you are at high risk for heart disease, you may need your cholesterol levels checked more frequently. Ongoing high lipid and cholesterol levels should be treated with medicines if diet and exercise are not working.  . If you smoke, find out from your health care provider how to quit. If you do not use tobacco, please do not start.  . If you choose to drink alcohol, please do not consume  more than 2 drinks per day. One drink is considered to be 12 ounces (355 mL) of beer, 5 ounces (148 mL) of wine, or 1.5 ounces (44 mL) of liquor.  . If you are 101-35 years old, ask your health care provider if you should take aspirin to prevent strokes.  . Use sunscreen. Apply sunscreen liberally and repeatedly throughout the day. You should seek shade when your shadow is shorter than you. Protect yourself by wearing long sleeves, pants, a wide-brimmed hat, and sunglasses year round, whenever you are outdoors.  . Once a month, do a whole body skin exam, using a mirror to look at the skin on your back. Tell your health care provider of new moles, moles that have irregular borders, moles that are larger than a pencil eraser, or moles that have changed in shape or color.

## 2016-09-21 ENCOUNTER — Encounter: Payer: PPO | Admitting: Family Medicine

## 2016-09-30 ENCOUNTER — Ambulatory Visit (INDEPENDENT_AMBULATORY_CARE_PROVIDER_SITE_OTHER): Payer: PPO | Admitting: Family Medicine

## 2016-09-30 ENCOUNTER — Encounter: Payer: Self-pay | Admitting: Family Medicine

## 2016-09-30 ENCOUNTER — Ambulatory Visit (INDEPENDENT_AMBULATORY_CARE_PROVIDER_SITE_OTHER)
Admission: RE | Admit: 2016-09-30 | Discharge: 2016-09-30 | Disposition: A | Discharge status: Home / Self Care | Payer: PPO | Source: Ambulatory Visit | Attending: Family Medicine | Admitting: Family Medicine

## 2016-09-30 VITALS — BP 142/74 | HR 70 | Temp 98.2°F | Wt 163.5 lb

## 2016-09-30 DIAGNOSIS — J0100 Acute maxillary sinusitis, unspecified: Secondary | ICD-10-CM

## 2016-09-30 DIAGNOSIS — R059 Cough, unspecified: Secondary | ICD-10-CM

## 2016-09-30 DIAGNOSIS — R05 Cough: Secondary | ICD-10-CM | POA: Diagnosis not present

## 2016-09-30 NOTE — Assessment & Plan Note (Signed)
S/p doxycyline- symptoms overall improving with persistent SOB. Will check CXR today. Also advised to call her dentist ASAP today as I do have concerns that her tooth pain is a dental issue at this point. The patient indicates understanding of these issues and agrees with the plan.

## 2016-09-30 NOTE — Progress Notes (Signed)
SUBJECTIVE:  Hannah Vasquez is a 65 y.o. female who complains of persistent cough and left sided tooth pain.  Saw Dr. Silvio Pate on 09/20/16 for persistent URI symptoms- saw Dr. Silvio Pate. Note reviewed. Given rx for doxycyline for sinusitis- finished this 4 days ago.  Cough is a little better but chest still tight and left sided upper tooth pain persists.  No fevers or chills.  Cough remains productive of brownish sputum. She is not a smoker.  Diarrhea has resolved.   Current Outpatient Prescriptions on File Prior to Visit  Medication Sig Dispense Refill  . aspirin EC 81 MG tablet Take 81 mg by mouth every morning.     . Calcium Carbonate-Vit D-Min 600-400 MG-UNIT TABS Take 1 tablet by mouth every morning.     . Cholecalciferol (VITAMIN D3) 1000 UNITS CAPS Take 1 capsule by mouth every morning.     Marland Kitchen desonide (DESOWEN) 0.05 % cream Apply topically 2 (two) times daily.    . fluocinonide-emollient (LIDEX-E) 0.05 % cream Apply 1 application topically daily as needed (to legs for rash). 30 g 2  . fluticasone (FLONASE) 50 MCG/ACT nasal spray Place 2 sprays into both nostrils as needed.    . hydrochlorothiazide (MICROZIDE) 12.5 MG capsule TAKE ONE CAPSULE BY MOUTH EVERY MORNING 90 capsule 2  . ketoconazole (NIZORAL) 2 % cream Apply 1 application topically daily.    Marland Kitchen loratadine (CLARITIN) 10 MG tablet Take 1 tablet (10 mg total) by mouth every morning. 30 tablet 2  . LYRICA 75 MG capsule TAKE ONE (1) CAPSULE BY MOUTH 2 TIMES DAILY 60 capsule 3  . meclizine (ANTIVERT) 25 MG tablet Take 1 tablet (25 mg total) by mouth every 4 (four) hours as needed for dizziness. 60 tablet 0  . metoprolol succinate (TOPROL-XL) 50 MG 24 hr tablet Take 1 tablet (50 mg total) by mouth daily. Take with or immediately following a meal. 90 tablet 3  . Multiple Vitamin (MULTIVITAMIN) tablet Take 1 tablet by mouth every morning.     . naproxen (NAPROSYN) 500 MG tablet TAKE 1 TABLET BY MOUTH TWICE A DAY WITH A MEAL. 60 tablet  3  . nystatin (MYCOSTATIN) 100000 UNIT/ML suspension Take 5 mLs (500,000 Units total) by mouth 4 (four) times daily. 100 mL 0  . Omega-3 Fatty Acids (FISH OIL) 1200 MG CAPS Take 1 capsule by mouth every morning.     Marland Kitchen omeprazole (PRILOSEC) 40 MG capsule     . PROAIR HFA 108 (90 Base) MCG/ACT inhaler INHALE 2 PUFFS INTO THE LUNGS EVERY 4 HOURS AS NEEDED 8.5 g 5   No current facility-administered medications on file prior to visit.     Allergies  Allergen Reactions  . Codeine     REACTION: nausea and vomiting  . Penicillins     REACTION: rash    Past Medical History:  Diagnosis Date  . Allergy   . Anxiety   . CAD (coronary artery disease)   . Hemorrhoids   . Hypertension   . Mitral valve prolapse   . Osteoporosis     Past Surgical History:  Procedure Laterality Date  . BASAL CELL CARCINOMA EXCISION  02/05/2016   Dr. Sarajane Jews, [facility]  . BLADDER SURGERY  11/15/1995   bladder neck suspension, urinary incontinence  . CATARACT EXTRACTION Left 04/30/2016   Dr. Tommy Rainwater, [facility]  . DENTAL SURGERY  06/20/2016   Tooth Implant; Dr. Kalman Shan  . KNEE SURGERY  2003   torn meniscus  . MOHS SURGERY Right  2017   right side of face  . SHOULDER SURGERY Right 2005    Family History  Problem Relation Age of Onset  . Mitral valve prolapse Mother   . Hypertension Father     Social History   Social History  . Marital status: Divorced    Spouse name: N/A  . Number of children: 2  . Years of education: N/A   Occupational History  . Medical office---front desk     Retired   Social History Main Topics  . Smoking status: Never Smoker  . Smokeless tobacco: Never Used  . Alcohol use Not on file  . Drug use: Unknown  . Sexual activity: Yes   Other Topics Concern  . Not on file   Social History Narrative   Has a living will.   HPOA- daughter.   She would desire CPR.  Would desire life support if necessary and not futile.         The PMH, PSH, Social  History, Family History, Medications, and allergies have been reviewed in [facility], and have been updated if relevant.   OBJECTIVE:  BP (!) 142/74   Pulse 70   Temp 98.2 F (36.8 C) (Oral)   Wt 163 lb 8 oz (74.2 kg)   SpO2 97%   BMI 29.90 kg/m   She appears well, vital signs are as noted. Ears normal.  Throat and pharynx normal.  Mouth is tender to outer palpation- left upper teeth. Neck supple. No adenopathy in the neck. Nose is congested. Sinuses non tender. The chest is clear, without wheezes or rales.

## 2016-09-30 NOTE — Progress Notes (Signed)
Pre visit review using our clinic review tool, if applicable. No additional management support is needed unless otherwise documented below in the visit note. 

## 2016-09-30 NOTE — Patient Instructions (Signed)
Great to see you.  I will call you with your xray results.   Please call your denitst today.

## 2016-10-01 ENCOUNTER — Telehealth: Payer: Self-pay | Admitting: Family Medicine

## 2016-10-01 DIAGNOSIS — Z1211 Encounter for screening for malignant neoplasm of colon: Secondary | ICD-10-CM | POA: Diagnosis not present

## 2016-10-01 DIAGNOSIS — R1314 Dysphagia, pharyngoesophageal phase: Secondary | ICD-10-CM | POA: Diagnosis not present

## 2016-10-01 DIAGNOSIS — R1310 Dysphagia, unspecified: Secondary | ICD-10-CM | POA: Diagnosis not present

## 2016-10-01 NOTE — Telephone Encounter (Signed)
See results note. 

## 2016-10-01 NOTE — Telephone Encounter (Signed)
Pt called about chest xray. Please call [phone] Thanks

## 2016-10-05 DIAGNOSIS — Z01419 Encounter for gynecological examination (general) (routine) without abnormal findings: Secondary | ICD-10-CM | POA: Diagnosis not present

## 2016-10-26 ENCOUNTER — Ambulatory Visit: Payer: PPO | Admitting: Family Medicine

## 2016-10-28 ENCOUNTER — Encounter: Payer: Self-pay | Admitting: Family Medicine

## 2016-10-28 ENCOUNTER — Ambulatory Visit (INDEPENDENT_AMBULATORY_CARE_PROVIDER_SITE_OTHER): Payer: PPO | Admitting: Family Medicine

## 2016-10-28 ENCOUNTER — Encounter (INDEPENDENT_AMBULATORY_CARE_PROVIDER_SITE_OTHER): Payer: Self-pay

## 2016-10-28 VITALS — BP 122/84 | HR 72 | Temp 97.8°F | Wt 163.0 lb

## 2016-10-28 DIAGNOSIS — Z01419 Encounter for gynecological examination (general) (routine) without abnormal findings: Secondary | ICD-10-CM | POA: Diagnosis not present

## 2016-10-28 DIAGNOSIS — R300 Dysuria: Secondary | ICD-10-CM | POA: Diagnosis not present

## 2016-10-28 DIAGNOSIS — I10 Essential (primary) hypertension: Secondary | ICD-10-CM | POA: Diagnosis not present

## 2016-10-28 LAB — POC URINALSYSI DIPSTICK (AUTOMATED)
BILIRUBIN UA: NEGATIVE
Blood, UA: NEGATIVE
GLUCOSE UA: NEGATIVE
Ketones, UA: NEGATIVE
Nitrite, UA: NEGATIVE
Protein, UA: NEGATIVE
SPEC GRAV UA: 1.03
UROBILINOGEN UA: 0.2
pH, UA: 5.5

## 2016-10-28 MED ORDER — CIPROFLOXACIN HCL 250 MG PO TABS: 250.0000 mg | ORAL_TABLET | Freq: Two times a day (BID) | ORAL | 0 refills | Status: DC

## 2016-10-28 NOTE — Progress Notes (Signed)
Pre visit review using our clinic review tool, if applicable. No additional management support is needed unless otherwise documented below in the visit note. 

## 2016-10-28 NOTE — Addendum Note (Signed)
Addended by: Pilar Grammes on: 10/28/2016 09:37 AM   Modules accepted: Orders

## 2016-10-28 NOTE — Assessment & Plan Note (Signed)
Well controlled on current rxs. No changes made today.

## 2016-10-28 NOTE — Patient Instructions (Addendum)
Great to see you.  Please take cipro as directed- 1 tablet twice daily for 5 days. We will call you with the results of your urine culture.    Urinary Tract Infection, Adult A urinary tract infection (UTI) is an infection of any part of the urinary tract. The urinary tract includes the:  Kidneys.  Ureters.  Bladder.  Urethra. These organs make, store, and get rid of pee (urine) in the body. Follow these instructions at home:  Take over-the-counter and prescription medicines only as told by your doctor.  If you were prescribed an antibiotic medicine, take it as told by your doctor. Do not stop taking the antibiotic even if you start to feel better.  Avoid the following drinks:  Alcohol.  Caffeine.  Tea.  Carbonated drinks.  Drink enough fluid to keep your pee clear or pale yellow.  Keep all follow-up visits as told by your doctor. This is important.  Make sure to:  Empty your bladder often and completely. Do not to hold pee for long periods of time.  Empty your bladder before and after sex.  Wipe from front to back after a bowel movement if you are female. Use each tissue one time when you wipe. Contact a doctor if:  You have back pain.  You have a fever.  You feel sick to your stomach (nauseous).  You throw up (vomit).  Your symptoms do not get better after 3 days.  Your symptoms go away and then come back. Get help right away if:  You have very bad back pain.  You have very bad lower belly (abdominal) pain.  You are throwing up and cannot keep down any medicines or water. This information is not intended to replace advice given to you by your health care provider. Make sure you discuss any questions you have with your health care provider. Document Released: 01/19/2008 Document Revised: 01/08/2016 Document Reviewed: 06/23/2015 Elsevier Interactive Patient Education  2017 [employer].

## 2016-10-28 NOTE — Assessment & Plan Note (Signed)
UA positive for LE and has classic symptoms of cystitis. Will send in rx for cipro 250 mg twice daily x 5 days. Send urine for cx. The patient indicates understanding of these issues and agrees with the plan.

## 2016-10-28 NOTE — Assessment & Plan Note (Signed)
Reviewed preventive care protocols, scheduled due services, and updated immunizations Discussed nutrition, exercise, diet, and healthy lifestyle.  

## 2016-10-28 NOTE — Progress Notes (Signed)
Subjective:   Patient ID: Hannah Vasquez, female    DOB: 1951-01-10, 66 y.o.   MRN: [id number]  Hannah Vasquez is a pleasant 66 y.o. year old female who presents to clinic today with Medicare Wellness (Part 2. Saw Katha Cabal 09-30-16.); Dysuria (Started 10-25-16. Also having some frequency.); and Mitral Valve Prolapse (Does she still need pre-meds for dental work)  on 10/28/2016  HPI:  Saw Candis Musa, RN for annual medicare wellness visit on 09/20/16. Note reviewed.  She is due for colonoscopy which has already been scheduled.  Dysuria- with increased frequency with urgency for 4 days. No fevers, chills, nausea or vomiting.  HTN- BP has been well controlled with HCTZ and Toprol. Lab Results  Component Value Date   CREATININE 0.81 09/20/2016   BP Readings from Last 3 Encounters:  10/28/16 122/84  09/30/16 (!) 142/74  09/20/16 122/76   GERD- symptoms controlled with PPI  Lab Results  Component Value Date   CHOL 147 09/20/2016   HDL 26.30 (L) 09/20/2016   LDLCALC 84 09/20/2016   LDLDIRECT 151.0 09/09/2011   TRIG 184.0 (H) 09/20/2016   CHOLHDL 6 09/20/2016   Lab Results  Component Value Date   NA 144 09/20/2016   K 3.3 (L) 09/20/2016   CL 107 09/20/2016   CO2 29 09/20/2016   Lab Results  Component Value Date   WBC 10.1 09/20/2016   HGB 13.5 09/20/2016   HCT 39.9 09/20/2016   MCV 78.8 09/20/2016   PLT 388.0 09/20/2016   Lab Results  Component Value Date   TSH 2.95 09/20/2016   Current Outpatient Prescriptions on File Prior to Visit  Medication Sig Dispense Refill  . aspirin EC 81 MG tablet Take 81 mg by mouth every morning.     . Calcium Carbonate-Vit D-Min 600-400 MG-UNIT TABS Take 1 tablet by mouth every morning.     . Cholecalciferol (VITAMIN D3) 1000 UNITS CAPS Take 1 capsule by mouth every morning.     Marland Kitchen desonide (DESOWEN) 0.05 % cream Apply topically 2 (two) times daily.    . fluocinonide-emollient (LIDEX-E) 0.05 % cream Apply 1 application topically daily as  needed (to legs for rash). 30 g 2  . fluticasone (FLONASE) 50 MCG/ACT nasal spray Place 2 sprays into both nostrils as needed.    . hydrochlorothiazide (MICROZIDE) 12.5 MG capsule TAKE ONE CAPSULE BY MOUTH EVERY MORNING 90 capsule 2  . ketoconazole (NIZORAL) 2 % cream Apply 1 application topically daily.    Marland Kitchen loratadine (CLARITIN) 10 MG tablet Take 1 tablet (10 mg total) by mouth every morning. 30 tablet 2  . LYRICA 75 MG capsule TAKE ONE (1) CAPSULE BY MOUTH 2 TIMES DAILY 60 capsule 3  . meclizine (ANTIVERT) 25 MG tablet Take 1 tablet (25 mg total) by mouth every 4 (four) hours as needed for dizziness. 60 tablet 0  . metoprolol succinate (TOPROL-XL) 50 MG 24 hr tablet Take 1 tablet (50 mg total) by mouth daily. Take with or immediately following a meal. 90 tablet 3  . Multiple Vitamin (MULTIVITAMIN) tablet Take 1 tablet by mouth every morning.     . naproxen (NAPROSYN) 500 MG tablet TAKE 1 TABLET BY MOUTH TWICE A DAY WITH A MEAL. 60 tablet 3  . nystatin (MYCOSTATIN) 100000 UNIT/ML suspension Take 5 mLs (500,000 Units total) by mouth 4 (four) times daily. 100 mL 0  . Omega-3 Fatty Acids (FISH OIL) 1200 MG CAPS Take 1 capsule by mouth every morning.     Marland Kitchen  omeprazole (PRILOSEC) 40 MG capsule     . PROAIR HFA 108 (90 Base) MCG/ACT inhaler INHALE 2 PUFFS INTO THE LUNGS EVERY 4 HOURS AS NEEDED 8.5 g 5   No current facility-administered medications on file prior to visit.     Allergies  Allergen Reactions  . Codeine     REACTION: nausea and vomiting  . Penicillins     REACTION: rash    Past Medical History:  Diagnosis Date  . Allergy   . Anxiety   . CAD (coronary artery disease)   . Hemorrhoids   . Hypertension   . Mitral valve prolapse   . Osteoporosis     Past Surgical History:  Procedure Laterality Date  . BASAL CELL CARCINOMA EXCISION  02/05/2016   Dr. Sarajane Jews, [facility]  . BLADDER SURGERY  11/15/1995   bladder neck suspension, urinary incontinence  . CATARACT  EXTRACTION Left 04/30/2016   Dr. Tommy Rainwater, [facility]  . DENTAL SURGERY  06/20/2016   Tooth Implant; Dr. Kalman Shan  . KNEE SURGERY  2003   torn meniscus  . MOHS SURGERY Right 2017   right side of face  . SHOULDER SURGERY Right 2005    Family History  Problem Relation Age of Onset  . Mitral valve prolapse Mother   . Hypertension Father     Social History   Social History  . Marital status: Divorced    Spouse name: N/A  . Number of children: 2  . Years of education: N/A   Occupational History  . Medical office---front desk     Retired   Social History Main Topics  . Smoking status: Never Smoker  . Smokeless tobacco: Never Used  . Alcohol use Not on file  . Drug use: Unknown  . Sexual activity: Yes   Other Topics Concern  . Not on file   Social History Narrative   Has a living will.   HPOA- daughter.   She would desire CPR.  Would desire life support if necessary and not futile.         .reveiwed    Review of Systems  Constitutional: Negative.   HENT: Negative.   Eyes: Negative.   Respiratory: Negative.   Cardiovascular: Negative.   Gastrointestinal: Negative.   Endocrine: Negative.   Genitourinary: Positive for dysuria, frequency and urgency.  Musculoskeletal: Negative.   Skin: Negative.   Allergic/Immunologic: Negative.   Neurological: Negative.   Hematological: Negative.   Psychiatric/Behavioral: Negative.   All other systems reviewed and are negative.      Objective:    BP 122/84 (BP Location: Left Arm, Patient Position: Sitting, Cuff Size: Normal)   Pulse 72   Temp 97.8 F (36.6 C) (Oral)   Wt 163 lb (73.9 kg)   SpO2 96%   BMI 29.81 kg/m    Physical Exam   General:  Well-developed,well-nourished,in no acute distress; alert,appropriate and cooperative throughout examination Head:  normocephalic and atraumatic.   Eyes:  vision grossly intact, PERRL Ears:  R ear normal and L ear normal externally, TMs clear bilaterally Nose:  no  external deformity.   Mouth:  good dentition.   Neck:  No deformities, masses, or tenderness noted. Breasts:  No mass, nodules, thickening, tenderness, bulging, retraction, inflamation, nipple discharge or skin changes noted.   Lungs:  Normal respiratory effort, chest expands symmetrically. Lungs are clear to auscultation, no crackles or wheezes. Heart:  Normal rate and regular rhythm. S1 and S2 normal without gallop, murmur, click, rub or other extra sounds.  Abdomen:  Bowel sounds positive,abdomen soft and non-tender without masses, organomegaly or hernias noted. Msk:  No deformity or scoliosis noted of thoracic or lumbar spine.   Extremities:  No clubbing, cyanosis, edema, or deformity noted with normal full range of motion of all joints.   Neurologic:  alert & oriented X3 and gait normal.   Skin:  Intact without suspicious lesions or rashes Cervical Nodes:  No lymphadenopathy noted Axillary Nodes:  No palpable lymphadenopathy Psych:  Cognition and judgment appear intact. Alert and cooperative with normal attention span and concentration. No apparent delusions, illusions, hallucinations       Assessment & Plan:   Well woman exam  HYPERTENSION, BENIGN No Follow-up on file.

## 2016-10-30 LAB — URINE CULTURE

## 2016-11-01 ENCOUNTER — Other Ambulatory Visit: Payer: Self-pay | Admitting: Family Medicine

## 2016-11-01 MED ORDER — CIPROFLOXACIN HCL 250 MG PO TABS: 250.0000 mg | ORAL_TABLET | Freq: Two times a day (BID) | ORAL | 0 refills | Status: DC

## 2016-11-18 ENCOUNTER — Other Ambulatory Visit: Payer: Self-pay | Admitting: Family Medicine

## 2016-11-23 DIAGNOSIS — H25041 Posterior subcapsular polar age-related cataract, right eye: Secondary | ICD-10-CM | POA: Diagnosis not present

## 2016-11-23 DIAGNOSIS — H02839 Dermatochalasis of unspecified eye, unspecified eyelid: Secondary | ICD-10-CM | POA: Diagnosis not present

## 2016-11-23 DIAGNOSIS — H2511 Age-related nuclear cataract, right eye: Secondary | ICD-10-CM | POA: Diagnosis not present

## 2016-11-23 DIAGNOSIS — H25011 Cortical age-related cataract, right eye: Secondary | ICD-10-CM | POA: Diagnosis not present

## 2016-11-26 ENCOUNTER — Encounter (INDEPENDENT_AMBULATORY_CARE_PROVIDER_SITE_OTHER): Payer: Self-pay

## 2016-11-26 ENCOUNTER — Ambulatory Visit (INDEPENDENT_AMBULATORY_CARE_PROVIDER_SITE_OTHER): Payer: PPO | Admitting: Internal Medicine

## 2016-11-26 ENCOUNTER — Encounter: Payer: Self-pay | Admitting: Internal Medicine

## 2016-11-26 VITALS — BP 130/82 | HR 75 | Temp 98.3°F | Resp 12 | Wt 161.0 lb

## 2016-11-26 DIAGNOSIS — J0100 Acute maxillary sinusitis, unspecified: Secondary | ICD-10-CM | POA: Diagnosis not present

## 2016-11-26 MED ORDER — DOXYCYCLINE HYCLATE 100 MG PO TABS: 100.0000 mg | ORAL_TABLET | Freq: Two times a day (BID) | ORAL | 0 refills | Status: DC

## 2016-11-26 NOTE — Assessment & Plan Note (Signed)
Has been sick only a few days but the skin lesions may be bacterial Will go ahead with Rx with doxy empirically Discussed analgesics

## 2016-11-26 NOTE — Progress Notes (Signed)
Subjective:    Patient ID: Hannah Vasquez, female    DOB: 04/18/1951, 66 y.o.   MRN: [id number]  HPI Here due to respiratory infection  Feels weak all over Some nausea Lots of cough Started 3-4 days ago No fever Some green sputum--seems to come from post nasal drip Can taste blood with PND Ears feel uncomfortable and stuffed Pain in anterior neck  Also with sore on inside of nostril and right ear Goes back about a week  No Rx as yet  Current Outpatient Prescriptions on File Prior to Visit  Medication Sig Dispense Refill  . aspirin EC 81 MG tablet Take 81 mg by mouth every morning.     . Calcium Carbonate-Vit D-Min 600-400 MG-UNIT TABS Take 1 tablet by mouth every morning.     . Cholecalciferol (VITAMIN D3) 1000 UNITS CAPS Take 1 capsule by mouth every morning.     Marland Kitchen desonide (DESOWEN) 0.05 % cream Apply topically 2 (two) times daily.    . fluocinonide-emollient (LIDEX-E) 0.05 % cream Apply 1 application topically daily as needed (to legs for rash). 30 g 2  . fluticasone (FLONASE) 50 MCG/ACT nasal spray Place 2 sprays into both nostrils as needed.    . hydrochlorothiazide (MICROZIDE) 12.5 MG capsule TAKE ONE CAPSULE BY MOUTH EVERY MORNING 90 capsule 2  . ketoconazole (NIZORAL) 2 % cream Apply 1 application topically daily.    Marland Kitchen loratadine (CLARITIN) 10 MG tablet Take 1 tablet (10 mg total) by mouth every morning. 30 tablet 2  . LYRICA 75 MG capsule TAKE ONE (1) CAPSULE BY MOUTH 2 TIMES DAILY 60 capsule 3  . meclizine (ANTIVERT) 25 MG tablet Take 1 tablet (25 mg total) by mouth every 4 (four) hours as needed for dizziness. 60 tablet 0  . metoprolol succinate (TOPROL-XL) 50 MG 24 hr tablet Take 1 tablet (50 mg total) by mouth daily. Take with or immediately following a meal. 90 tablet 3  . Multiple Vitamin (MULTIVITAMIN) tablet Take 1 tablet by mouth every morning.     . naproxen (NAPROSYN) 500 MG tablet TAKE 1 TABLET BY MOUTH TWICE A DAY WITH A MEAL. 60 tablet 3  . nystatin  (MYCOSTATIN) 100000 UNIT/ML suspension Take 5 mLs (500,000 Units total) by mouth 4 (four) times daily. 100 mL 0  . Omega-3 Fatty Acids (FISH OIL) 1200 MG CAPS Take 1 capsule by mouth every morning.     Marland Kitchen omeprazole (PRILOSEC) 40 MG capsule     . PROAIR HFA 108 (90 Base) MCG/ACT inhaler INHALE 2 PUFFS INTO THE LUNGS EVERY 4 HOURS AS NEEDED 8.5 g 5   No current facility-administered medications on file prior to visit.     Allergies  Allergen Reactions  . Codeine     REACTION: nausea and vomiting  . Penicillins     REACTION: rash    Past Medical History:  Diagnosis Date  . Allergy   . Anxiety   . CAD (coronary artery disease)   . Hemorrhoids   . Hypertension   . Mitral valve prolapse   . Osteoporosis     Past Surgical History:  Procedure Laterality Date  . BASAL CELL CARCINOMA EXCISION  02/05/2016   Dr. Sarajane Jews, [facility]  . BLADDER SURGERY  11/15/1995   bladder neck suspension, urinary incontinence  . CATARACT EXTRACTION Left 04/30/2016   Dr. Tommy Rainwater, [facility]  . DENTAL SURGERY  06/20/2016   Tooth Implant; Dr. Kalman Shan  . KNEE SURGERY  2003   torn meniscus  .  MOHS SURGERY Right 2017   right side of face  . SHOULDER SURGERY Right 2005    Family History  Problem Relation Age of Onset  . Mitral valve prolapse Mother   . Hypertension Father     Social History   Social History  . Marital status: Divorced    Spouse name: N/A  . Number of children: 2  . Years of education: N/A   Occupational History  . Medical office---front desk     Retired   Social History Main Topics  . Smoking status: Never Smoker  . Smokeless tobacco: Never Used  . Alcohol use Not on file  . Drug use: Unknown  . Sexual activity: Yes   Other Topics Concern  . Not on file   Social History Narrative   Has a living will.   HPOA- daughter.   She would desire CPR.  Would desire life support if necessary and not futile.           Review of Systems Slight sweats and  chills Gets SOB easy when walking No rash Some headache No vomiting or diarrhea Appetite is off some    Objective:   Physical Exam  Constitutional: She appears well-nourished. No distress.  HENT:   No sinus tenderness Moderate nasal inflammation Can't seen nasal lesion well Slight pharyngeal injection TMs normal  Neck: Neck supple.  Pulmonary/Chest: Effort normal and breath sounds normal. No respiratory distress. She has no wheezes. She has no rales.  Lymphadenopathy:    She has no cervical adenopathy.  Skin:  Crusted lesion on inside of right pinna          Assessment & Plan:

## 2016-11-26 NOTE — Progress Notes (Signed)
Pre visit review using our clinic review tool, if applicable. No additional management support is needed unless otherwise documented below in the visit note. 

## 2016-11-30 ENCOUNTER — Encounter: Payer: Self-pay | Admitting: Family Medicine

## 2016-11-30 ENCOUNTER — Ambulatory Visit (INDEPENDENT_AMBULATORY_CARE_PROVIDER_SITE_OTHER): Payer: PPO | Admitting: Family Medicine

## 2016-11-30 DIAGNOSIS — J069 Acute upper respiratory infection, unspecified: Secondary | ICD-10-CM | POA: Insufficient documentation

## 2016-11-30 MED ORDER — PROMETHAZINE-DM 6.25-15 MG/5ML PO SYRP: 5.0000 mL | ORAL_SOLUTION | Freq: Four times a day (QID) | ORAL | 0 refills | Status: DC | PRN

## 2016-11-30 NOTE — Assessment & Plan Note (Signed)
Likely viral. Exam reassuring. Nasal lesions improved- advised to finish course of doxycyline. Send in rx for promethazine dm- discussed sedation precautions. Call or return to clinic prn if these symptoms worsen or fail to improve as anticipated. The patient indicates understanding of these issues and agrees with the plan.

## 2016-11-30 NOTE — Patient Instructions (Signed)
Great to see you.  Finish doxycyline, take promethazine dm as needed cough (when not driving).  Drink plenty of fluids.  Keep me updated.

## 2016-11-30 NOTE — Progress Notes (Signed)
Pre visit review using our clinic review tool, if applicable. No additional management support is needed unless otherwise documented below in the visit note. 

## 2016-11-30 NOTE — Progress Notes (Signed)
Subjective:   Patient ID: Hannah Vasquez, female    DOB: 04/15/1951, 66 y.o.   MRN: [id number]  Hannah Vasquez is a pleasant 66 y.o. year old female who presents to clinic today with Cough (Has had a cough for a while. Has been seen several times recently. Says she can hear herself breathing. Chest feels heavy/tight)  on 11/30/2016  HPI:  Has had intermittent respiratory illnesses since 08/2016.  Has been seen here three times for this.  Most recently was seen by Dr. Silvio Pate on 11/26/16. Note reviewed. At that time, she was complaining of 3-4 days of feeling week, productive cough, pain in anterior neck and ears.  He felt likely viral but had some skin lesions on her nare that could be bacterial so placed her on doxycycline.  Today, nasal lesions have resolved but other symptoms are worsened- more congestion, more achiness, cough is keeping you up.     Current Outpatient Prescriptions on File Prior to Visit  Medication Sig Dispense Refill  . aspirin EC 81 MG tablet Take 81 mg by mouth every morning.     . Calcium Carbonate-Vit D-Min 600-400 MG-UNIT TABS Take 1 tablet by mouth every morning.     . Cholecalciferol (VITAMIN D3) 1000 UNITS CAPS Take 1 capsule by mouth every morning.     Marland Kitchen desonide (DESOWEN) 0.05 % cream Apply topically 2 (two) times daily.    Marland Kitchen doxycycline (VIBRA-TABS) 100 MG tablet Take 1 tablet (100 mg total) by mouth 2 (two) times daily. 14 tablet 0  . fluocinonide-emollient (LIDEX-E) 0.05 % cream Apply 1 application topically daily as needed (to legs for rash). 30 g 2  . fluticasone (FLONASE) 50 MCG/ACT nasal spray Place 2 sprays into both nostrils as needed.    . hydrochlorothiazide (MICROZIDE) 12.5 MG capsule TAKE ONE CAPSULE BY MOUTH EVERY MORNING 90 capsule 2  . ketoconazole (NIZORAL) 2 % cream Apply 1 application topically daily.    Marland Kitchen loratadine (CLARITIN) 10 MG tablet Take 1 tablet (10 mg total) by mouth every morning. 30 tablet 2  . LYRICA 75 MG capsule TAKE ONE  (1) CAPSULE BY MOUTH 2 TIMES DAILY 60 capsule 3  . meclizine (ANTIVERT) 25 MG tablet Take 1 tablet (25 mg total) by mouth every 4 (four) hours as needed for dizziness. 60 tablet 0  . metoprolol succinate (TOPROL-XL) 50 MG 24 hr tablet Take 1 tablet (50 mg total) by mouth daily. Take with or immediately following a meal. 90 tablet 3  . Multiple Vitamin (MULTIVITAMIN) tablet Take 1 tablet by mouth every morning.     . naproxen (NAPROSYN) 500 MG tablet TAKE 1 TABLET BY MOUTH TWICE A DAY WITH A MEAL. 60 tablet 3  . nystatin (MYCOSTATIN) 100000 UNIT/ML suspension Take 5 mLs (500,000 Units total) by mouth 4 (four) times daily. 100 mL 0  . Omega-3 Fatty Acids (FISH OIL) 1200 MG CAPS Take 1 capsule by mouth every morning.     Marland Kitchen omeprazole (PRILOSEC) 40 MG capsule     . PROAIR HFA 108 (90 Base) MCG/ACT inhaler INHALE 2 PUFFS INTO THE LUNGS EVERY 4 HOURS AS NEEDED 8.5 g 5   No current facility-administered medications on file prior to visit.     Allergies  Allergen Reactions  . Codeine     REACTION: nausea and vomiting  . Penicillins     REACTION: rash    Past Medical History:  Diagnosis Date  . Allergy   . Anxiety   . CAD (  coronary artery disease)   . Hemorrhoids   . Hypertension   . Mitral valve prolapse   . Osteoporosis     Past Surgical History:  Procedure Laterality Date  . BASAL CELL CARCINOMA EXCISION  02/05/2016   Dr. Sarajane Jews, [facility]  . BLADDER SURGERY  11/15/1995   bladder neck suspension, urinary incontinence  . CATARACT EXTRACTION Left 04/30/2016   Dr. Tommy Rainwater, [facility]  . DENTAL SURGERY  06/20/2016   Tooth Implant; Dr. Kalman Shan  . KNEE SURGERY  2003   torn meniscus  . MOHS SURGERY Right 2017   right side of face  . SHOULDER SURGERY Right 2005    Family History  Problem Relation Age of Onset  . Mitral valve prolapse Mother   . Hypertension Father     Social History   Social History  . Marital status: Divorced    Spouse name: N/A  . Number  of children: 2  . Years of education: N/A   Occupational History  . Medical office---front desk     Retired   Social History Main Topics  . Smoking status: Never Smoker  . Smokeless tobacco: Never Used  . Alcohol use Not on file  . Drug use: Unknown  . Sexual activity: Yes   Other Topics Concern  . Not on file   Social History Narrative   Has a living will.   HPOA- daughter.   She would desire CPR.  Would desire life support if necessary and not futile.         The PMH, PSH, Social History, Family History, Medications, and allergies have been reviewed in [facility], and have been updated if relevant.   Review of Systems  Constitutional: Negative for fever.  HENT: Positive for congestion and rhinorrhea.   Respiratory: Positive for cough and shortness of breath.   Cardiovascular: Negative.   Gastrointestinal: Negative.   Musculoskeletal: Negative.   Neurological: Negative.   Hematological: Negative.   Psychiatric/Behavioral: Negative.   All other systems reviewed and are negative.      Objective:    BP 116/80 (BP Location: Left Arm, Patient Position: Sitting, Cuff Size: Large)   Pulse 72   Temp 97.9 F (36.6 C) (Oral)   Wt 159 lb (72.1 kg)   SpO2 96%   BMI 29.08 kg/m    Physical Exam   She appears well, vital signs are as noted. Ears normal.  Throat and pharynx normal.  Neck supple. No adenopathy in the neck. Nose is congested. Sinuses non tender. The chest is clear, without wheezes or rales. .      Assessment & Plan:   Upper respiratory tract infection, unspecified type No Follow-up on file.

## 2016-12-06 DIAGNOSIS — H2511 Age-related nuclear cataract, right eye: Secondary | ICD-10-CM | POA: Diagnosis not present

## 2016-12-06 DIAGNOSIS — Z961 Presence of intraocular lens: Secondary | ICD-10-CM | POA: Diagnosis not present

## 2016-12-14 DIAGNOSIS — M25562 Pain in left knee: Secondary | ICD-10-CM | POA: Diagnosis not present

## 2016-12-14 DIAGNOSIS — M545 Low back pain: Secondary | ICD-10-CM | POA: Diagnosis not present

## 2016-12-14 DIAGNOSIS — M25561 Pain in right knee: Secondary | ICD-10-CM | POA: Diagnosis not present

## 2016-12-21 ENCOUNTER — Other Ambulatory Visit: Payer: Self-pay | Admitting: Family Medicine

## 2016-12-21 NOTE — Telephone Encounter (Signed)
Lyrica called into [place], [place] Phone: [phone]

## 2016-12-21 NOTE — Telephone Encounter (Signed)
Last office visit 11/30/2016.  Last refilled 08/20/2016 for #60 with 3 refills.  Ok to refill?

## 2016-12-28 DIAGNOSIS — R1310 Dysphagia, unspecified: Secondary | ICD-10-CM | POA: Diagnosis not present

## 2016-12-28 DIAGNOSIS — Z1211 Encounter for screening for malignant neoplasm of colon: Secondary | ICD-10-CM | POA: Diagnosis not present

## 2017-01-01 ENCOUNTER — Emergency Department (HOSPITAL_COMMUNITY): Payer: PPO

## 2017-01-01 ENCOUNTER — Encounter (HOSPITAL_COMMUNITY): Payer: Self-pay | Admitting: Emergency Medicine

## 2017-01-01 ENCOUNTER — Inpatient Hospital Stay (HOSPITAL_COMMUNITY)
Admission: EM | Admit: 2017-01-01 | Discharge: 2017-01-04 | DRG: 247 | Disposition: A | Discharge status: Home / Self Care | Payer: PPO | Attending: Cardiovascular Disease | Admitting: Cardiovascular Disease

## 2017-01-01 DIAGNOSIS — I214 Non-ST elevation (NSTEMI) myocardial infarction: Principal | ICD-10-CM | POA: Diagnosis present

## 2017-01-01 DIAGNOSIS — Z85828 Personal history of other malignant neoplasm of skin: Secondary | ICD-10-CM | POA: Diagnosis not present

## 2017-01-01 DIAGNOSIS — R079 Chest pain, unspecified: Secondary | ICD-10-CM | POA: Diagnosis not present

## 2017-01-01 DIAGNOSIS — I255 Ischemic cardiomyopathy: Secondary | ICD-10-CM | POA: Diagnosis present

## 2017-01-01 DIAGNOSIS — Z955 Presence of coronary angioplasty implant and graft: Secondary | ICD-10-CM

## 2017-01-01 DIAGNOSIS — Z6830 Body mass index (BMI) 30.0-30.9, adult: Secondary | ICD-10-CM

## 2017-01-01 DIAGNOSIS — Z7982 Long term (current) use of aspirin: Secondary | ICD-10-CM

## 2017-01-01 DIAGNOSIS — I25110 Atherosclerotic heart disease of native coronary artery with unstable angina pectoris: Secondary | ICD-10-CM | POA: Diagnosis not present

## 2017-01-01 DIAGNOSIS — T502X5A Adverse effect of carbonic-anhydrase inhibitors, benzothiadiazides and other diuretics, initial encounter: Secondary | ICD-10-CM | POA: Diagnosis present

## 2017-01-01 DIAGNOSIS — E876 Hypokalemia: Secondary | ICD-10-CM | POA: Diagnosis not present

## 2017-01-01 DIAGNOSIS — I10 Essential (primary) hypertension: Secondary | ICD-10-CM | POA: Diagnosis present

## 2017-01-01 DIAGNOSIS — M810 Age-related osteoporosis without current pathological fracture: Secondary | ICD-10-CM | POA: Diagnosis present

## 2017-01-01 DIAGNOSIS — I2510 Atherosclerotic heart disease of native coronary artery without angina pectoris: Secondary | ICD-10-CM | POA: Diagnosis not present

## 2017-01-01 DIAGNOSIS — Z9842 Cataract extraction status, left eye: Secondary | ICD-10-CM

## 2017-01-01 DIAGNOSIS — I420 Dilated cardiomyopathy: Secondary | ICD-10-CM | POA: Diagnosis not present

## 2017-01-01 DIAGNOSIS — E785 Hyperlipidemia, unspecified: Secondary | ICD-10-CM | POA: Diagnosis present

## 2017-01-01 DIAGNOSIS — I341 Nonrheumatic mitral (valve) prolapse: Secondary | ICD-10-CM | POA: Diagnosis not present

## 2017-01-01 DIAGNOSIS — K219 Gastro-esophageal reflux disease without esophagitis: Secondary | ICD-10-CM | POA: Diagnosis not present

## 2017-01-01 DIAGNOSIS — I4510 Unspecified right bundle-branch block: Secondary | ICD-10-CM | POA: Diagnosis present

## 2017-01-01 DIAGNOSIS — I252 Old myocardial infarction: Secondary | ICD-10-CM | POA: Diagnosis present

## 2017-01-01 DIAGNOSIS — E669 Obesity, unspecified: Secondary | ICD-10-CM | POA: Diagnosis present

## 2017-01-01 DIAGNOSIS — Z79899 Other long term (current) drug therapy: Secondary | ICD-10-CM | POA: Diagnosis not present

## 2017-01-01 DIAGNOSIS — F411 Generalized anxiety disorder: Secondary | ICD-10-CM | POA: Diagnosis not present

## 2017-01-01 HISTORY — DX: Other specified postprocedural states: Z98.890

## 2017-01-01 HISTORY — DX: Unspecified cataract: H26.9

## 2017-01-01 HISTORY — DX: Personal history of other diseases of the digestive system: Z87.19

## 2017-01-01 LAB — BASIC METABOLIC PANEL
Anion gap: 12 (ref 5–15)
BUN: 15 mg/dL (ref 6–20)
CALCIUM: 8.9 mg/dL (ref 8.9–10.3)
CO2: 25 mmol/L (ref 22–32)
CREATININE: 0.89 mg/dL (ref 0.44–1.00)
Chloride: 104 mmol/L (ref 101–111)
GFR calc Af Amer: >60 mL/min (ref >60)
GLUCOSE: 173 mg/dL — AB (ref 65–99)
POTASSIUM: 2.8 mmol/L — AB (ref 3.5–5.1)
SODIUM: 141 mmol/L (ref 135–145)

## 2017-01-01 LAB — D-DIMER, QUANTITATIVE: D-Dimer, Quant: <0.27 ug/mL-FEU (ref 0.00–0.50)

## 2017-01-01 LAB — PROTIME-INR
INR: 1.13
Prothrombin Time: 14.5 seconds (ref 11.4–15.2)

## 2017-01-01 LAB — CBC
HCT: 38.8 % (ref 36.0–46.0)
Hemoglobin: 12.7 g/dL (ref 12.0–15.0)
MCH: 26.5 pg (ref 26.0–34.0)
MCHC: 32.7 g/dL (ref 30.0–36.0)
MCV: 81 fL (ref 78.0–100.0)
PLATELETS: 292 10*3/uL (ref 150–400)
RBC: 4.79 MIL/uL (ref 3.87–5.11)
RDW: 14.5 % (ref 11.5–15.5)
WBC: 10.4 10*3/uL (ref 4.0–10.5)

## 2017-01-01 LAB — APTT: APTT: 160 s — AB (ref 24–36)

## 2017-01-01 LAB — I-STAT TROPONIN, ED: TROPONIN I, POC: 0.16 ng/mL — AB (ref 0.00–0.08)

## 2017-01-01 MED ORDER — POTASSIUM CHLORIDE CRYS ER 20 MEQ PO TBCR
40.0000 meq | EXTENDED_RELEASE_TABLET | Freq: Once | ORAL | Status: AC
  Administered 2017-01-01: 40 meq via ORAL
  Filled 2017-01-01: qty 2

## 2017-01-01 MED ORDER — FENTANYL CITRATE (PF) 100 MCG/2ML IJ SOLN
50.0000 ug | Freq: Once | INTRAMUSCULAR | Status: AC
  Administered 2017-01-01: 50 ug via INTRAVENOUS
  Filled 2017-01-01: qty 2

## 2017-01-01 MED ORDER — HEPARIN BOLUS VIA INFUSION
4000.0000 [IU] | Freq: Once | INTRAVENOUS | Status: AC
Start: 2017-01-01 — End: 2017-01-01
  Administered 2017-01-01: 4000 [IU] via INTRAVENOUS
  Filled 2017-01-01: qty 4000

## 2017-01-01 MED ORDER — NITROGLYCERIN 0.4 MG SL SUBL
0.4000 mg | SUBLINGUAL_TABLET | SUBLINGUAL | Status: DC | PRN
  Administered 2017-01-01 – 2017-01-03 (×8): 0.4 mg via SUBLINGUAL
  Filled 2017-01-01 (×7): qty 1

## 2017-01-01 MED ORDER — ASPIRIN 81 MG PO CHEW
324.0000 mg | CHEWABLE_TABLET | Freq: Once | ORAL | Status: AC
Start: 2017-01-01 — End: 2017-01-01
  Administered 2017-01-01: 324 mg via ORAL
  Filled 2017-01-01: qty 4

## 2017-01-01 MED ORDER — HEPARIN (PORCINE) IN NACL 100-0.45 UNIT/ML-% IJ SOLN
1000.0000 [IU]/h | INTRAMUSCULAR | Status: DC
  Administered 2017-01-01: 800 [IU]/h via INTRAVENOUS
  Administered 2017-01-02: 1000 [IU]/h via INTRAVENOUS
  Filled 2017-01-01 (×2): qty 250

## 2017-01-01 NOTE — ED Triage Notes (Signed)
Pt reports intermittent midline CP accompanied by lightheadedness for the past 2 weeks. Symptoms began again 2 hours ago. No SOB.

## 2017-01-01 NOTE — Progress Notes (Signed)
ANTICOAGULATION CONSULT NOTE - Initial Consult  Pharmacy Consult for IV heparin Indication: chest pain/ACS  Allergies  Allergen Reactions  . Codeine     REACTION: nausea and vomiting  . Penicillins     REACTION: rash    Patient Measurements: Weight: 165 lb (74.8 kg) Heparin Dosing Weight: 66.3 kg   Vital Signs: Temp: 98 F (36.7 C) (05/19 1845) Temp Source: Oral (05/19 1845) BP: 142/76 (05/19 2030) Pulse Rate: 73 (05/19 2030)  Labs:  Recent Labs  01/01/17 1900  HGB 12.7  HCT 38.8  PLT 292  CREATININE 0.89    Estimated Creatinine Clearance: 57.3 mL/min (by C-G formula based on SCr of 0.89 mg/dL).   Medical History: Past Medical History:  Diagnosis Date  . Allergy   . Anxiety   . CAD (coronary artery disease)   . Cataract   . Hemorrhoids   . Hypertension   . Mitral valve prolapse   . Osteoporosis   . Status post dilation of esophageal narrowing     Medications:  Scheduled:  . fentaNYL (SUBLIMAZE) injection  50 mcg Intravenous Once  . potassium chloride  40 mEq Oral Once    Assessment: Pharmacy is consulted to dose heparin in pt with ACS. Per home med list, pt is not on any blood thinners. Pt is on aspirin 81 mg PO daily. Pt  presents to the Emergency Department complaining of chest pain.  Over the last 2 weeks she has experienced intermittent central chest pain described as a squeezing squeezing sensation. At times the pain radiates to the left or the right. Over the last day her pain has increased in frequency and severity.   Baseline labs:   Hgb 12.7, plt 292  Scr 0.89  PT/INR pending    Goal of Therapy:  Heparin level 0.3-0.7 units/ml Monitor platelets by anticoagulation protocol: Yes   Plan:   Give 4000 units bolus x 1  Start heparin infusion at 800 units/hr  Obtain heparin level 6 hours after start  Monitor for signs and symptoms of bleeding   Royetta Asal, PharmD, BCPS Pager [phone] 01/01/2017 9:12 PM

## 2017-01-01 NOTE — ED Provider Notes (Signed)
[facility] DEPT Provider Note   CSN: [id number] Arrival date & time: 01/01/17  1836     History   Chief Complaint Chief Complaint  Patient presents with  . Chest Pain    HPI Hannah Vasquez is a 66 y.o. female.  The history is provided by the patient. No language interpreter was used.  Chest Pain      Hannah Vasquez is a 66 y.o. female who presents to the Emergency Department complaining of chest pain.  Over the last 2 weeks she has experienced intermittent central chest pain described as a squeezing squeezing sensation. At times the pain radiates to the left or the right. Over the last day her pain has increased in frequency and severity. She has occasional associated dizziness as well as mild headache. No fevers, cough, shortness of breath, nausea, vomiting, pain, leg swelling or pain. 3 days ago she had an esophageal dilation as well as colonoscopy. No biopsies were taken at that time. She does take an aspirin daily and sees Dr. Marlou Porch with cardiology for mitral valve prolapse.   Past Medical History:  Diagnosis Date  . Allergy   . Anxiety   . CAD (coronary artery disease)   . Cataract   . Hemorrhoids   . Hypertension   . Mitral valve prolapse   . Osteoporosis   . Status post dilation of esophageal narrowing     Patient Active Problem List   Diagnosis Date Noted  . NSTEMI (non-ST elevated myocardial infarction) ([place]) 01/01/2017  . Upper respiratory infection 11/30/2016  . Acute non-recurrent maxillary sinusitis 09/20/2016  . RBBB 12/19/2013  . Mitral valve prolapse   . HYPERTENSION, BENIGN 08/27/2010  . Anxiety state 06/27/2009  . CAD 06/27/2009  . Mitral valve disorder 06/27/2009  . HEMORRHOIDS 06/27/2009  . ALLERGIC RHINITIS 06/27/2009  . OSTEOPENIA 11/11/2006    Past Surgical History:  Procedure Laterality Date  . BASAL CELL CARCINOMA EXCISION  02/05/2016   Dr. Sarajane Jews, [facility]  . BLADDER SURGERY  11/15/1995   bladder neck suspension,  urinary incontinence  . CATARACT EXTRACTION Left 04/30/2016   Dr. Tommy Rainwater, [facility]  . DENTAL SURGERY  06/20/2016   Tooth Implant; Dr. Kalman Shan  . KNEE SURGERY  2003   torn meniscus  . MOHS SURGERY Right 2017   right side of face  . SHOULDER SURGERY Right 2005    OB History    No data available       Home Medications    Prior to Admission medications   Medication Sig Start Date End Date Taking? Authorizing Provider  aspirin EC 81 MG tablet Take 81 mg by mouth every morning.    Yes [provider]  Calcium Carbonate-Vit D-Min 600-400 MG-UNIT TABS Take 1 tablet by mouth every morning.    Yes [provider]  Cholecalciferol (VITAMIN D3) 1000 UNITS CAPS Take 1 capsule by mouth every morning.    Yes [provider]  hydrochlorothiazide (MICROZIDE) 12.5 MG capsule TAKE ONE CAPSULE BY MOUTH EVERY MORNING 09/20/16  Yes Lucille Passy, MD  LYRICA 75 MG capsule TAKE ONE CAPSULE BY MOUTH TWICE A DAY 12/21/16  Yes Lucille Passy, MD  metoprolol succinate (TOPROL-XL) 50 MG 24 hr tablet Take 1 tablet (50 mg total) by mouth daily. Take with or immediately following a meal. 04/15/16  Yes Jerline Pain, MD  Multiple Vitamin (MULTIVITAMIN) tablet Take 1 tablet by mouth every morning.    Yes [provider]  Omega-3 Fatty Acids (  FISH OIL) 1200 MG CAPS Take 1 capsule by mouth every morning.    Yes [provider]  omeprazole (PRILOSEC) 40 MG capsule  05/21/16  Yes [provider]  doxycycline (VIBRA-TABS) 100 MG tablet Take 1 tablet (100 mg total) by mouth 2 (two) times daily. Patient not taking: Reported on 01/01/2017 11/26/16   Venia Carbon, MD  fluocinonide-emollient (LIDEX-E) 0.05 % cream Apply 1 application topically daily as needed (to legs for rash). Patient not taking: Reported on 01/01/2017 11/08/13   Lucille Passy, MD  loratadine (CLARITIN) 10 MG tablet Take 1 tablet (10 mg total) by mouth every morning. Patient not taking: Reported on  01/01/2017 11/08/13   Lucille Passy, MD  meclizine (ANTIVERT) 25 MG tablet Take 1 tablet (25 mg total) by mouth every 4 (four) hours as needed for dizziness. 04/12/16   Lucille Passy, MD  naproxen (NAPROSYN) 500 MG tablet TAKE 1 TABLET BY MOUTH TWICE A DAY WITH A MEAL. Patient not taking: Reported on 01/01/2017 08/24/16   Lucille Passy, MD  nystatin (MYCOSTATIN) 100000 UNIT/ML suspension Take 5 mLs (500,000 Units total) by mouth 4 (four) times daily. Patient not taking: Reported on 01/01/2017 06/15/16   Jearld Fenton, NP  PROAIR HFA 108 [phone] Base) MCG/ACT inhaler INHALE 2 PUFFS INTO THE LUNGS EVERY 4 HOURS AS NEEDED Patient taking differently: INHALE 2 PUFFS INTO THE LUNGS EVERY 4 HOURS AS NEEDED FOR SOB AND WHEEZING 09/26/15   Lucille Passy, MD  promethazine-dextromethorphan (PROMETHAZINE-DM) 6.25-15 MG/5ML syrup Take 5 mLs by mouth 4 (four) times daily as needed. Patient not taking: Reported on 01/01/2017 11/30/16   Lucille Passy, MD    Family History Family History  Problem Relation Age of Onset  . Mitral valve prolapse Mother   . Hypertension Father     Social History Social History  Substance Use Topics  . Smoking status: Never Smoker  . Smokeless tobacco: Never Used  . Alcohol use Not on file     Allergies   Codeine and Penicillins   Review of Systems Review of Systems  Cardiovascular: Positive for chest pain.  All other systems reviewed and are negative.    Physical Exam Updated Vital Signs BP 134/63   Pulse 62   Temp 98 F (36.7 C) (Oral)   Resp 16   Wt 165 lb (74.8 kg)   SpO2 96%   BMI 30.18 kg/m   Physical Exam  Constitutional: She is oriented to person, place, and time. She appears well-developed and well-nourished.  HENT:  Head: Normocephalic and atraumatic.  Cardiovascular: Normal rate and regular rhythm.   No murmur heard. Pulmonary/Chest: Effort normal and breath sounds normal. No respiratory distress.  Abdominal: Soft. There is no tenderness. There is  no rebound and no guarding.  Musculoskeletal: She exhibits no edema or tenderness.  Neurological: She is alert and oriented to person, place, and time.  Skin: Skin is warm and dry.  Psychiatric: She has a normal mood and affect. Her behavior is normal.  Nursing note and vitals reviewed.    ED Treatments / Results  Labs (all labs ordered are listed, but only abnormal results are displayed) Labs Reviewed  BASIC METABOLIC PANEL - Abnormal; Notable for the following:       Result Value   Potassium 2.8 (*)    Glucose, Bld 173 (*)    All other components within normal limits  APTT - Abnormal; Notable for the following:    aPTT 160 (*)  All other components within normal limits  I-STAT TROPOININ, ED - Abnormal; Notable for the following:    Troponin i, poc 0.16 (*)    All other components within normal limits  CBC  PROTIME-INR  D-DIMER, QUANTITATIVE (NOT AT [facility])  MAGNESIUM  HEPARIN LEVEL (UNFRACTIONATED)  TROPONIN I    EKG  EKG Interpretation  Date/Time:  Saturday Jan 01 2017 19:28:14 EDT Ventricular Rate:  76 PR Interval:    QRS Duration: 147 QT Interval:  435 QTC Calculation: 490 R Axis:   61 Text Interpretation:  Sinus rhythm Right bundle branch block Confirmed by Hazle Coca [phone]) on 01/01/2017 7:33:47 PM       Radiology Dg Chest 2 View  Result Date: 01/01/2017 CLINICAL DATA:  Chest pain 4 2 weeks EXAM: CHEST  2 VIEW COMPARISON:  09/30/2016 FINDINGS: Cardiac shadow is stable. The lungs are well aerated bilaterally. Minimal left basilar atelectasis is seen. No sizable effusion is noted. No acute bony abnormality is seen. Old left rib fracture is noted. IMPRESSION: Minimal left basilar atelectasis. Electronically Signed   By: Inez Catalina M.D.   On: 01/01/2017 19:02    Procedures Procedures (including critical care time)  Medications Ordered in ED Medications  nitroGLYCERIN (NITROSTAT) SL tablet 0.4 mg (0.4 mg Sublingual Given 01/01/17 2014)  heparin ADULT  infusion 100 units/mL (25000 units/22m sodium chloride 0.45%) (800 Units/hr Intravenous Transfusing/Transfer 01/01/17 2250)  aspirin chewable tablet 324 mg (324 mg Oral Given 01/01/17 1947)  heparin bolus via infusion 4,000 Units (4,000 Units Intravenous Bolus from Bag 01/01/17 2019)  potassium chloride SA (K-DUR,KLOR-CON) CR tablet 40 mEq (40 mEq Oral Given 01/01/17 2147)  fentaNYL (SUBLIMAZE) injection 50 mcg (50 mcg Intravenous Given 01/01/17 2147)     Initial Impression / Assessment and Plan / ED Course  I have reviewed the triage vital signs and the nursing notes.  Pertinent labs & imaging results that were available during my care of the patient were reviewed by me and considered in my medical decision making (see chart for details).     Patient here for evaluation of episodic chest pain. EKG with right bundle branch block that is similar to priors. Patient does have mild pain in the department that did not significantly change with nitroglycerin. Troponin is mildly elevated. Concern for non-ST elevation MI. Discussed with patient findings of studies and need for further evaluation by cardiology. [facility]  Final Clinical Impressions(s) / ED Diagnoses   Final diagnoses:  None    New Prescriptions New Prescriptions   No medications on file     RQuintella Reichert MD 01/01/17 2337

## 2017-01-01 NOTE — ED Notes (Signed)
Dr. Ralene Bathe verbally requested for a repeat EKG.

## 2017-01-02 DIAGNOSIS — E876 Hypokalemia: Secondary | ICD-10-CM

## 2017-01-02 DIAGNOSIS — I10 Essential (primary) hypertension: Secondary | ICD-10-CM

## 2017-01-02 DIAGNOSIS — I4510 Unspecified right bundle-branch block: Secondary | ICD-10-CM

## 2017-01-02 DIAGNOSIS — I341 Nonrheumatic mitral (valve) prolapse: Secondary | ICD-10-CM

## 2017-01-02 DIAGNOSIS — I214 Non-ST elevation (NSTEMI) myocardial infarction: Principal | ICD-10-CM

## 2017-01-02 LAB — LIPID PANEL
CHOL/HDL RATIO: 6.3 ratio
Cholesterol: 144 mg/dL (ref 0–200)
HDL: 23 mg/dL — AB (ref >40)
LDL CALC: 82 mg/dL (ref 0–99)
Triglycerides: 193 mg/dL — ABNORMAL HIGH (ref <150)
VLDL: 39 mg/dL (ref 0–40)

## 2017-01-02 LAB — CBC
HCT: 37 % (ref 36.0–46.0)
Hemoglobin: 12 g/dL (ref 12.0–15.0)
MCH: 26.5 pg (ref 26.0–34.0)
MCHC: 32.4 g/dL (ref 30.0–36.0)
MCV: 81.9 fL (ref 78.0–100.0)
PLATELETS: 239 10*3/uL (ref 150–400)
RBC: 4.52 MIL/uL (ref 3.87–5.11)
RDW: 14.7 % (ref 11.5–15.5)
WBC: 10.2 10*3/uL (ref 4.0–10.5)

## 2017-01-02 LAB — PROTIME-INR
INR: 1.12
PROTHROMBIN TIME: 14.4 s (ref 11.4–15.2)

## 2017-01-02 LAB — TROPONIN I
TROPONIN I: 0.39 ng/mL — AB (ref <0.03)
TROPONIN I: 0.47 ng/mL — AB (ref <0.03)
TROPONIN I: 0.58 ng/mL — AB (ref <0.03)
TROPONIN I: 1.43 ng/mL — AB (ref <0.03)

## 2017-01-02 LAB — BASIC METABOLIC PANEL
Anion gap: 9 (ref 5–15)
BUN: 13 mg/dL (ref 6–20)
CALCIUM: 8.5 mg/dL — AB (ref 8.9–10.3)
CO2: 28 mmol/L (ref 22–32)
CREATININE: 0.74 mg/dL (ref 0.44–1.00)
Chloride: 105 mmol/L (ref 101–111)
GFR calc Af Amer: >60 mL/min (ref >60)
GFR calc non Af Amer: >60 mL/min (ref >60)
GLUCOSE: 112 mg/dL — AB (ref 65–99)
Potassium: 3.3 mmol/L — ABNORMAL LOW (ref 3.5–5.1)
Sodium: 142 mmol/L (ref 135–145)

## 2017-01-02 LAB — HEPARIN LEVEL (UNFRACTIONATED)
HEPARIN UNFRACTIONATED: 0.24 [IU]/mL — AB (ref 0.30–0.70)
HEPARIN UNFRACTIONATED: 0.31 [IU]/mL (ref 0.30–0.70)
HEPARIN UNFRACTIONATED: 0.3 [IU]/mL (ref 0.30–0.70)

## 2017-01-02 LAB — MAGNESIUM: MAGNESIUM: 2 mg/dL (ref 1.7–2.4)

## 2017-01-02 MED ORDER — SODIUM CHLORIDE 0.9 % WEIGHT BASED INFUSION
1.0000 mL/kg/h | INTRAVENOUS | Status: DC
  Administered 2017-01-03: 1 mL/kg/h via INTRAVENOUS

## 2017-01-02 MED ORDER — ASPIRIN EC 81 MG PO TBEC
81.0000 mg | DELAYED_RELEASE_TABLET | Freq: Every morning | ORAL | Status: AC
  Administered 2017-01-02: 81 mg via ORAL
  Filled 2017-01-02: qty 1

## 2017-01-02 MED ORDER — OMEGA-3-ACID ETHYL ESTERS 1 G PO CAPS
1.0000 g | ORAL_CAPSULE | Freq: Every day | ORAL | Status: DC
  Administered 2017-01-02 – 2017-01-04 (×3): 1 g via ORAL
  Filled 2017-01-02 (×3): qty 1

## 2017-01-02 MED ORDER — LORATADINE 10 MG PO TABS
10.0000 mg | ORAL_TABLET | Freq: Every morning | ORAL | Status: DC
  Administered 2017-01-02 – 2017-01-04 (×3): 10 mg via ORAL
  Filled 2017-01-02 (×3): qty 1

## 2017-01-02 MED ORDER — FISH OIL 1200 MG PO CAPS: 1.0000 | ORAL_CAPSULE | Freq: Every morning | ORAL | Status: DC

## 2017-01-02 MED ORDER — SODIUM CHLORIDE 0.9% FLUSH
3.0000 mL | INTRAVENOUS | Status: DC | PRN
Start: 2017-01-02 — End: 2017-01-03

## 2017-01-02 MED ORDER — POTASSIUM CHLORIDE CRYS ER 20 MEQ PO TBCR
40.0000 meq | EXTENDED_RELEASE_TABLET | Freq: Once | ORAL | Status: AC
  Administered 2017-01-02: 40 meq via ORAL
  Filled 2017-01-02: qty 2

## 2017-01-02 MED ORDER — ATORVASTATIN CALCIUM 80 MG PO TABS
80.0000 mg | ORAL_TABLET | Freq: Every day | ORAL | Status: DC
  Administered 2017-01-02 – 2017-01-03 (×2): 80 mg via ORAL
  Filled 2017-01-02 (×2): qty 1

## 2017-01-02 MED ORDER — SODIUM CHLORIDE 0.9% FLUSH
3.0000 mL | Freq: Two times a day (BID) | INTRAVENOUS | Status: DC
  Administered 2017-01-02: 3 mL via INTRAVENOUS

## 2017-01-02 MED ORDER — ONDANSETRON HCL 4 MG/2ML IJ SOLN
4.0000 mg | Freq: Four times a day (QID) | INTRAMUSCULAR | Status: DC | PRN
  Administered 2017-01-03: 4 mg via INTRAVENOUS
  Filled 2017-01-02: qty 2

## 2017-01-02 MED ORDER — ASPIRIN 81 MG PO CHEW
81.0000 mg | CHEWABLE_TABLET | ORAL | Status: AC
  Administered 2017-01-03: 81 mg via ORAL
  Filled 2017-01-02: qty 1

## 2017-01-02 MED ORDER — CALCIUM CARBONATE-VITAMIN D 500-200 MG-UNIT PO TABS
1.0000 | ORAL_TABLET | Freq: Every day | ORAL | Status: DC
  Administered 2017-01-02 – 2017-01-04 (×3): 1 via ORAL
  Filled 2017-01-02 (×3): qty 1

## 2017-01-02 MED ORDER — PREGABALIN 25 MG PO CAPS
75.0000 mg | ORAL_CAPSULE | Freq: Two times a day (BID) | ORAL | Status: DC
  Administered 2017-01-02 – 2017-01-04 (×5): 75 mg via ORAL
  Filled 2017-01-02: qty 1
  Filled 2017-01-02 (×2): qty 3
  Filled 2017-01-02 (×2): qty 1

## 2017-01-02 MED ORDER — SODIUM CHLORIDE 0.9 % IV SOLN: 250.0000 mL | INTRAVENOUS | Status: DC | PRN

## 2017-01-02 MED ORDER — ALBUTEROL SULFATE (2.5 MG/3ML) 0.083% IN NEBU
2.5000 mg | INHALATION_SOLUTION | RESPIRATORY_TRACT | Status: DC
  Filled 2017-01-02: qty 3

## 2017-01-02 MED ORDER — PANTOPRAZOLE SODIUM 40 MG PO TBEC
40.0000 mg | DELAYED_RELEASE_TABLET | Freq: Every day | ORAL | Status: DC
  Administered 2017-01-02 – 2017-01-04 (×3): 40 mg via ORAL
  Filled 2017-01-02 (×3): qty 1

## 2017-01-02 MED ORDER — SODIUM CHLORIDE 0.9 % WEIGHT BASED INFUSION
3.0000 mL/kg/h | INTRAVENOUS | Status: DC
  Administered 2017-01-03: 3 mL/kg/h via INTRAVENOUS

## 2017-01-02 MED ORDER — VITAMIN D 1000 UNITS PO TABS
1000.0000 [IU] | ORAL_TABLET | Freq: Every morning | ORAL | Status: DC
Start: 2017-01-02 — End: 2017-01-04
  Administered 2017-01-02 – 2017-01-04 (×3): 1000 [IU] via ORAL
  Filled 2017-01-02 (×3): qty 1

## 2017-01-02 MED ORDER — CALCIUM CARBONATE-VIT D-MIN 600-400 MG-UNIT PO TABS: 1.0000 | ORAL_TABLET | Freq: Every morning | ORAL | Status: DC

## 2017-01-02 MED ORDER — HYDROCHLOROTHIAZIDE 12.5 MG PO CAPS: 12.5000 mg | ORAL_CAPSULE | Freq: Every morning | ORAL | Status: DC

## 2017-01-02 MED ORDER — ADULT MULTIVITAMIN W/MINERALS CH
1.0000 | ORAL_TABLET | Freq: Every morning | ORAL | Status: DC
Start: 2017-01-02 — End: 2017-01-04
  Administered 2017-01-02 – 2017-01-04 (×3): 1 via ORAL
  Filled 2017-01-02 (×3): qty 1

## 2017-01-02 MED ORDER — MECLIZINE HCL 25 MG PO TABS
25.0000 mg | ORAL_TABLET | ORAL | Status: DC | PRN
  Filled 2017-01-02: qty 1

## 2017-01-02 MED ORDER — METOPROLOL SUCCINATE ER 50 MG PO TB24
50.0000 mg | ORAL_TABLET | Freq: Every day | ORAL | Status: DC
Start: 2017-01-02 — End: 2017-01-04
  Administered 2017-01-02 – 2017-01-04 (×3): 50 mg via ORAL
  Filled 2017-01-02 (×3): qty 1

## 2017-01-02 MED ORDER — NITROGLYCERIN 2 % TD OINT
0.5000 [in_us] | TOPICAL_OINTMENT | Freq: Three times a day (TID) | TRANSDERMAL | Status: DC
  Administered 2017-01-02 – 2017-01-03 (×3): 0.5 [in_us] via TOPICAL
  Filled 2017-01-02: qty 30

## 2017-01-02 MED ORDER — ASPIRIN EC 81 MG PO TBEC
81.0000 mg | DELAYED_RELEASE_TABLET | Freq: Every day | ORAL | Status: DC
  Administered 2017-01-02 – 2017-01-04 (×2): 81 mg via ORAL
  Filled 2017-01-02: qty 1

## 2017-01-02 MED ORDER — ALBUTEROL SULFATE (2.5 MG/3ML) 0.083% IN NEBU: 2.5000 mg | INHALATION_SOLUTION | RESPIRATORY_TRACT | Status: DC | PRN

## 2017-01-02 MED ORDER — ACETAMINOPHEN 325 MG PO TABS
650.0000 mg | ORAL_TABLET | ORAL | Status: DC | PRN
  Administered 2017-01-02 – 2017-01-03 (×5): 650 mg via ORAL
  Filled 2017-01-02 (×5): qty 2

## 2017-01-02 NOTE — H&P (Signed)
Patient ID: Hannah Vasquez MRN: [id number], DOB/AGE: [birth year]   Admit date: 01/01/2017   Primary Physician: Lucille Passy, MD Primary Cardiologist: Marlou Porch Reason for admission: NSTEMI  HPI: The pt is a 66yo female with PMH significant for HTN, MVP, a chronic RBBB, and a recent colonoscopy who presented to the [facility] emergency department with substernal CP. She had been experiencing intermittent CP for the past two weeks, described as a "pressure" over the center of her chest, and occurring both at rest and with exertion.  The discomfort would typically last several minutes before self-resolving, and was intermittently associated with mild nausea.  On the day of presentation she was at rest when the CP returned but did not resolve, prompting presentation to the OSH ED. The discomfort then waxed and waned for several hours before fully resolving, but was not appreciably improved after receiving either SLNG or fentanyl.  Initial w/u was remarkable for a hypokalemia with K=2.8 and a troponin of 0.16.  Given this mildly elevated troponin and risk factors of age and HTN she was started on a heparin gtt and given a full dose ASA, with subsequent request for admission to [facility] for further management.  On arrival here she was CP free and has no other acute complaints   Problem List  Past Medical History:  Diagnosis Date  . Allergy   . Anxiety   . CAD (coronary artery disease)   . Cataract   . Hemorrhoids   . Hypertension   . Mitral valve prolapse   . Osteoporosis   . Status post dilation of esophageal narrowing     Past Surgical History:  Procedure Laterality Date  . BASAL CELL CARCINOMA EXCISION  02/05/2016   Dr. Sarajane Jews, [facility]  . BLADDER SURGERY  11/15/1995   bladder neck suspension, urinary incontinence  . CATARACT EXTRACTION Left 04/30/2016   Dr. Tommy Rainwater, [facility]  . DENTAL SURGERY  06/20/2016   Tooth Implant; Dr. Kalman Shan  . KNEE SURGERY  2003   torn meniscus  . MOHS SURGERY Right 2017   right side of face  . SHOULDER SURGERY Right 2005     Allergies  Allergies  Allergen Reactions  . Codeine     REACTION: nausea and vomiting  . Penicillins     REACTION: rash     Home Medications  Prior to Admission medications   Medication Sig Start Date End Date Taking? Authorizing Provider  aspirin EC 81 MG tablet Take 81 mg by mouth every morning.    Yes [provider]  Calcium Carbonate-Vit D-Min 600-400 MG-UNIT TABS Take 1 tablet by mouth every morning.    Yes [provider]  Cholecalciferol (VITAMIN D3) 1000 UNITS CAPS Take 1 capsule by mouth every morning.    Yes [provider]  hydrochlorothiazide (MICROZIDE) 12.5 MG capsule TAKE ONE CAPSULE BY MOUTH EVERY MORNING 09/20/16  Yes Lucille Passy, MD  LYRICA 75 MG capsule TAKE ONE CAPSULE BY MOUTH TWICE A DAY 12/21/16  Yes Lucille Passy, MD  metoprolol succinate (TOPROL-XL) 50 MG 24 hr tablet Take 1 tablet (50 mg total) by mouth daily. Take with or immediately following a meal. 04/15/16  Yes Jerline Pain, MD  Multiple Vitamin (MULTIVITAMIN) tablet Take 1 tablet by mouth every morning.    Yes [provider]  Omega-3 Fatty Acids (FISH OIL) 1200 MG CAPS Take 1 capsule by mouth every morning.    Yes [provider]  omeprazole ([place])  40 MG capsule  05/21/16  Yes [provider]  doxycycline (VIBRA-TABS) 100 MG tablet Take 1 tablet (100 mg total) by mouth 2 (two) times daily. Patient not taking: Reported on 01/01/2017 11/26/16   Venia Carbon, MD  fluocinonide-emollient (LIDEX-E) 0.05 % cream Apply 1 application topically daily as needed (to legs for rash). Patient not taking: Reported on 01/01/2017 11/08/13   Lucille Passy, MD  loratadine (CLARITIN) 10 MG tablet Take 1 tablet (10 mg total) by mouth every morning. Patient not taking: Reported on 01/01/2017 11/08/13   Lucille Passy, MD  meclizine (ANTIVERT) 25 MG tablet Take 1 tablet  (25 mg total) by mouth every 4 (four) hours as needed for dizziness. 04/12/16   Lucille Passy, MD  naproxen (NAPROSYN) 500 MG tablet TAKE 1 TABLET BY MOUTH TWICE A DAY WITH A MEAL. Patient not taking: Reported on 01/01/2017 08/24/16   Lucille Passy, MD  nystatin (MYCOSTATIN) 100000 UNIT/ML suspension Take 5 mLs (500,000 Units total) by mouth 4 (four) times daily. Patient not taking: Reported on 01/01/2017 06/15/16   Jearld Fenton, NP  PROAIR HFA 108 [phone] Base) MCG/ACT inhaler INHALE 2 PUFFS INTO THE LUNGS EVERY 4 HOURS AS NEEDED Patient taking differently: INHALE 2 PUFFS INTO THE LUNGS EVERY 4 HOURS AS NEEDED FOR SOB AND WHEEZING 09/26/15   Lucille Passy, MD  promethazine-dextromethorphan (PROMETHAZINE-DM) 6.25-15 MG/5ML syrup Take 5 mLs by mouth 4 (four) times daily as needed. Patient not taking: Reported on 01/01/2017 11/30/16   Lucille Passy, MD    Family History  Family History  Problem Relation Age of Onset  . Mitral valve prolapse Mother   . Hypertension Father     Family Status  Relation Status  . Mother (Not Specified)  . Father (Not Specified)     Social History  Social History   Social History  . Marital status: Divorced    Spouse name: N/A  . Number of children: 2  . Years of education: N/A   Occupational History  . Medical office---front desk     Retired   Social History Main Topics  . Smoking status: Never Smoker  . Smokeless tobacco: Never Used  . Alcohol use Not on file  . Drug use: Unknown  . Sexual activity: Yes   Other Topics Concern  . Not on file   Social History Narrative   Has a living will.   HPOA- daughter.   She would desire CPR.  Would desire life support if necessary and not futile.           Review of Systems General:  No chills, fever, night sweats or weight changes.  Cardiovascular:  No chest pain, dyspnea on exertion, edema, orthopnea, palpitations, paroxysmal nocturnal dyspnea. Dermatological: No rash, lesions/masses Respiratory:  No cough, dyspnea Urologic: No hematuria, dysuria Abdominal:   No nausea, vomiting, diarrhea, bright red blood per rectum, melena, or hematemesis Neurologic:  No visual changes, wkns, changes in mental status. All other systems reviewed and are otherwise negative except as noted above.  Physical Exam  Blood pressure (!) 138/52, pulse 64, temperature 98.4 F (36.9 C), temperature source Oral, resp. rate 17, height 5' 2"  (1.575 m), weight 75.6 kg (166 lb 9.6 oz), SpO2 98 %.  General: Pleasant, NAD Psych: Normal affect. Neuro: Alert and oriented X 3. Moves all extremities spontaneously. HEENT: Normal  Neck: Supple without bruits or JVD. Lungs:  CTAB, no w/r/c Heart: RRR, +S1 +S2, no appreciable m/r/g Abdomen: Soft, non-tender,  non-distended, BS + x 4.  Extremities: No clubbing, cyanosis or edema. DP/PT/Radials 2+ and equal bilaterally.  Labs  No results for input(s): CKTOTAL, CKMB, TROPONINI in the last 72 hours. Lab Results  Component Value Date   WBC 10.4 01/01/2017   HGB 12.7 01/01/2017   HCT 38.8 01/01/2017   MCV 81.0 01/01/2017   PLT 292 01/01/2017    Recent Labs Lab 01/01/17 1900  NA 141  K 2.8*  CL 104  CO2 25  BUN 15  CREATININE 0.89  CALCIUM 8.9  GLUCOSE 173*   Lab Results  Component Value Date   CHOL 147 09/20/2016   HDL 26.30 (L) 09/20/2016   LDLCALC 84 09/20/2016   TRIG 184.0 (H) 09/20/2016   Lab Results  Component Value Date   DDIMER <0.27 01/01/2017     Radiology/Studies  Dg Chest 2 View  Result Date: 01/01/2017 CLINICAL DATA:  Chest pain 4 2 weeks EXAM: CHEST  2 VIEW COMPARISON:  09/30/2016 FINDINGS: Cardiac shadow is stable. The lungs are well aerated bilaterally. Minimal left basilar atelectasis is seen. No sizable effusion is noted. No acute bony abnormality is seen. Old left rib fracture is noted. IMPRESSION: Minimal left basilar atelectasis. Electronically Signed   By: Inez Catalina M.D.   On: 01/01/2017 19:02    ECG  Per my review,  tonight's tracing shows NSR with normal axis, a RBBB, and no acute ischemic changes  ASSESSMENT AND PLAN  The pt is a 66yo female with PMH significant for HTN, MVP, a chronic RBBB, and a recent colonoscopy who [facility] with substernal CP after experiencing two weeks of intermittent pain. The pt's CP has both typical and atypical features, but given the mildly elevated troponin level and her the character of her pain today a treatment course for NSTEMI was initiated.  Of note, CAD is auto-populated as a diagnosis in EPIC, but this is not reflected in any of her cardiology or prior cardiac procedures.  She does carry risk factors of age, obesity, and HTN.  # CP; both typical and atypical features, given elevated troponin will treat for NSTEMI as per below - ASA 329m x 1, then 859mPO QDAY - heparin gtt  - hold P2Y12 inhibition for now - metoprolol succinate 5053mO QDAY - atorvastatin 66m7m QDAY - trend troponin until peak - repeat ECG with recurrent CP - TTE in am - NPO for possible cath pending troponin trend  # HTN - metoprolol as per above - HCTZ 12.5mg 18mQDAY  # Hypokalemia; GI losses with recent colon prep - repleted at OSH, recheck now - goal K>4 and Mg>2  # GERD - continue omeprazole   Signed, Yarethzi Branan Clayborne Dana5/20/2018, 1:00 AM

## 2017-01-02 NOTE — Progress Notes (Signed)
Progress Note  Patient Name: ERISHA PAUGH Date of Encounter: 01/02/2017  Primary Cardiologist: Marlou Porch (2015)  Subjective   She has had another episode of chest pressure around 7:30 this morning. Resolved within a few minutes of receiving acetaminophen. Right now pain-free  Describes accelerating angina pectoris for the last 2 weeks.  Inpatient Medications    Scheduled Meds: . albuterol  2.5 mg Inhalation Q4H  . aspirin EC  81 mg Oral q morning - 10a  . atorvastatin  80 mg Oral q1800  . calcium-vitamin D  1 tablet Oral Q breakfast  . cholecalciferol  1,000 Units Oral q morning - 10a  . hydrochlorothiazide  12.5 mg Oral q morning - 10a  . loratadine  10 mg Oral q morning - 10a  . metoprolol succinate  50 mg Oral Daily  . multivitamin with minerals  1 tablet Oral q morning - 10a  . omega-3 acid ethyl esters  1 g Oral Daily  . pantoprazole  40 mg Oral Daily  . pregabalin  75 mg Oral BID   Continuous Infusions: . heparin 950 Units/hr (01/02/17 0315)   PRN Meds: acetaminophen, meclizine, nitroGLYCERIN, ondansetron (ZOFRAN) IV   Vital Signs    Vitals:   01/01/17 2230 01/01/17 2300 01/02/17 0005 01/02/17 0300  BP: (!) 139/55 134/63 (!) 138/52 127/63  Pulse: 63 62 64 (!) 57  Resp: 20 16 17 17   Temp:   98.4 F (36.9 C) 98.2 F (36.8 C)  TempSrc:   Oral Oral  SpO2: 95% 96% 98% 95%  Weight:   166 lb 9.6 oz (75.6 kg)   Height:   5' 2"  (1.575 m)     Intake/Output Summary (Last 24 hours) at 01/02/17 0755 Last data filed at 01/02/17 0300  Gross per 24 hour  Intake            53.33 ml  Output                0 ml  Net            53.33 ml   Filed Weights   01/01/17 1945 01/02/17 0005  Weight: 165 lb (74.8 kg) 166 lb 9.6 oz (75.6 kg)    Telemetry    Sinus rhythm - Personally Reviewed  ECG    Sinus rhythm, old right bundle branch block, nonspecific ST depression in the lateral leads, similar to previous tracing from August 2017 - Personally Reviewed  Physical  Exam  Mildly obese GEN: No acute distress.   Neck: No JVD Cardiac: RRR, widely split S2, no murmurs, rubs, or gallops.  Respiratory: Clear to auscultation bilaterally. GI: Soft, nontender, non-distended  MS: No edema; No deformity. Neuro:  Nonfocal  Psych: Normal affect   Labs    Chemistry Recent Labs Lab 01/01/17 1900  NA 141  K 2.8*  CL 104  CO2 25  GLUCOSE 173*  BUN 15  CREATININE 0.89  CALCIUM 8.9  GFRNONAA >60  GFRAA >60  ANIONGAP 12     Hematology Recent Labs Lab 01/01/17 1900 01/02/17 0641  WBC 10.4 10.2  RBC 4.79 4.52  HGB 12.7 12.0  HCT 38.8 37.0  MCV 81.0 81.9  MCH 26.5 26.5  MCHC 32.7 32.4  RDW 14.5 14.7  PLT 292 239    Cardiac Enzymes Recent Labs Lab 01/01/17 2144 01/02/17 0144  TROPONINI 0.39* 0.47*    Recent Labs Lab 01/01/17 1910  TROPIPOC 0.16*     BNPNo results for input(s): BNP, PROBNP in the  last 168 hours.   DDimer  Recent Labs Lab 01/01/17 2144  DDIMER <0.27     Radiology    Dg Chest 2 View  Result Date: 01/01/2017 CLINICAL DATA:  Chest pain 4 2 weeks EXAM: CHEST  2 VIEW COMPARISON:  09/30/2016 FINDINGS: Cardiac shadow is stable. The lungs are well aerated bilaterally. Minimal left basilar atelectasis is seen. No sizable effusion is noted. No acute bony abnormality is seen. Old left rib fracture is noted. IMPRESSION: Minimal left basilar atelectasis. Electronically Signed   By: Inez Catalina M.D.   On: 01/01/2017 19:02    Cardiac Studies   ECG shows normal sinus rhythm, old right bundle branch block, pre-existing nonspecific lateral ST segment depression  Patient Profile     66 y.o. female without previous known coronary disease presents with unstable angina pectoris and abnormal cardiac enzymes. Also has severe hypokalemia that is likely related to chronic thiazide diuretic therapy.  Assessment & Plan    1. NSTEMI - Small amount of myocardial injury so far, but symptoms are compelling for unstable angina  pectoris. I would proceed directly to coronary angiography. Percutaneous revascularization with recent of stent if indicated.This procedure has been fully reviewed with the patient and written informed consent has been obtained. Meanwhile continue intravenous heparin and will add long-acting nitrates. She is already taking beta blockers. Recent lipid profile showed a fairly normal LDL cholesterol at 82, but very low HDL at 23. We'll place on high-dose statin regardless. 2. HypoK - secondary to thiazide diuretic. Will stop this medication. Consider adding RAAS inhibitor after cath, especially if there is any abnormality in LV function. 3. HTN - fair control 4. RBBB - pre-existing 5. MVP - trivial posterior leaflet prolapse and minimal mitral regurgitation by echo 2015, not evident on physical exam   Signed, Sanda Klein, MD  01/02/2017, 7:55 AM

## 2017-01-02 NOTE — Progress Notes (Signed)
ANTICOAGULATION CONSULT NOTE - Follow-up Consult  Pharmacy Consult for IV heparin Indication: chest pain/ACS  Allergies  Allergen Reactions  . Codeine     REACTION: nausea and vomiting  . Penicillins     REACTION: rash    Patient Measurements: Height: 5' 2"  (157.5 cm) Weight: 166 lb 9.6 oz (75.6 kg) IBW/kg (Calculated) : 50.1 Heparin Dosing Weight: 66.3 kg   Vital Signs: Temp: 98.4 F (36.9 C) (05/20 0005) Temp Source: Oral (05/20 0005) BP: 138/52 (05/20 0005) Pulse Rate: 64 (05/20 0005)  Labs:  Recent Labs  01/01/17 1900 01/01/17 2144 01/02/17 0144  HGB 12.7  --   --   HCT 38.8  --   --   PLT 292  --   --   APTT  --  160*  --   LABPROT  --  14.5  --   INR  --  1.13  --   HEPARINUNFRC  --   --  0.24*  CREATININE 0.89  --   --   TROPONINI  --  0.39* 0.47*    Estimated Creatinine Clearance: 57.6 mL/min (by C-G formula based on SCr of 0.89 mg/dL).  Assessment: 66 y.o. F on heparin for NSTEMI. Heparin level subtherapeutic (0.24) on gtt at 800 units/hr. No issues with line or bleeding reported per RN.  Goal of Therapy:  Heparin level 0.3-0.7 units/ml Monitor platelets by anticoagulation protocol: Yes   Plan: Increase heparin gtt to 950 units/hr  Will f/u 6 hr heparin level  Sherlon Handing, PharmD, BCPS Clinical pharmacist, pager [phone] 01/02/2017 2:58 AM

## 2017-01-02 NOTE — Progress Notes (Signed)
ANTICOAGULATION CONSULT NOTE - Follow-up Consult  Pharmacy Consult for IV heparin Indication: chest pain/ACS  Allergies  Allergen Reactions  . Codeine     REACTION: nausea and vomiting  . Penicillins     REACTION: rash    Patient Measurements: Height: 5' 2"  (157.5 cm) Weight: 166 lb 9.6 oz (75.6 kg) IBW/kg (Calculated) : 50.1 Heparin Dosing Weight: 66.3 kg   Vital Signs: Temp: 98.2 F (36.8 C) (05/20 0300) Temp Source: Oral (05/20 0300) BP: 127/63 (05/20 0300) Pulse Rate: 57 (05/20 0300)  Labs:  Recent Labs  01/01/17 1900 01/01/17 2144 01/02/17 0144 01/02/17 0641  HGB 12.7  --   --  12.0  HCT 38.8  --   --  37.0  PLT 292  --   --  239  APTT  --  160*  --   --   LABPROT  --  14.5  --  14.4  INR  --  1.13  --  1.12  HEPARINUNFRC  --   --  0.24* 0.31  CREATININE 0.89  --   --  0.74  TROPONINI  --  0.39* 0.47* 0.58*    Estimated Creatinine Clearance: 64.1 mL/min (by C-G formula based on SCr of 0.74 mg/dL).  Assessment: 66 y.o. F on heparin for NSTEMI. Heparin level therapeutic (0.31) on gtt at 950 units/hr. This level was drawn ~ 2 hours early based on rate change. H/H stable/ platelets down slightly and no overt signs of bleeding have been noted in chart. Will re-check level ~ 6 hours after last draw to verify that patient will be therapeutic on this rate.   Goal of Therapy:  Heparin level 0.3-0.7 units/ml Monitor platelets by anticoagulation protocol: Yes   Plan: Continue heparin at 950 units/hr 6 hour heparin level Daily heparin level/CBC Monitor for signs/symptoms of bleeding  Uvaldo Bristle, PharmD PGY1 Pharmacy Resident Pager: [phone] 01/02/2017 9:33 AM

## 2017-01-02 NOTE — Progress Notes (Signed)
Received pt from [facility]. Pt alert and oriented, VSS, with heparin gtt infusing. Assessment and admission documentation complete. See flowsheet. Cardiologist fellow paged on pt's arrival. Will continue to monitor.  Jacqlyn Larsen, RN

## 2017-01-02 NOTE — Plan of Care (Signed)
Problem: Pain Managment: Goal: General experience of comfort will improve Outcome: Not Progressing Pt states CP is better controlled. Pain/Pressure rated 3/10 but tolerable. Will closely monitor.

## 2017-01-02 NOTE — Progress Notes (Signed)
Denies CP and SOB at this time. No further c/o expressed at this time.

## 2017-01-02 NOTE — Progress Notes (Signed)
ANTICOAGULATION CONSULT NOTE - Follow-up Consult  Pharmacy Consult for IV heparin Indication: chest pain/ACS  Allergies  Allergen Reactions  . Codeine     REACTION: nausea and vomiting  . Penicillins     REACTION: rash    Patient Measurements: Height: 5' 2"  (157.5 cm) Weight: 166 lb 9.6 oz (75.6 kg) IBW/kg (Calculated) : 50.1 Heparin Dosing Weight: 66.3 kg   Vital Signs: Temp: 98.4 F (36.9 C) (05/20 1329) Temp Source: Oral (05/20 1329) BP: 147/72 (05/20 1329) Pulse Rate: 79 (05/20 1329)  Labs:  Recent Labs  01/01/17 1900 01/01/17 2144 01/02/17 0144 01/02/17 0641 01/02/17 1241  HGB 12.7  --   --  12.0  --   HCT 38.8  --   --  37.0  --   PLT 292  --   --  239  --   APTT  --  160*  --   --   --   LABPROT  --  14.5  --  14.4  --   INR  --  1.13  --  1.12  --   HEPARINUNFRC  --   --  0.24* 0.31 0.30  CREATININE 0.89  --   --  0.74  --   TROPONINI  --  0.39* 0.47* 0.58*  --     Estimated Creatinine Clearance: 64.1 mL/min (by C-G formula based on SCr of 0.74 mg/dL).  Assessment: 66 y.o. F on heparin for NSTEMI. Heparin level therapeutic (0.30) on gtt at 950 units/hr but at the lower end of the range.  No signs of bleeding noted. Will bump up heparin slightly to prevent patient from being subtherapeutic.   Goal of Therapy:  Heparin level 0.3-0.7 units/ml Monitor platelets by anticoagulation protocol: Yes   Plan: Increase heparin to 1000 units/hr Daily heparin level/CBC Monitor for signs/symptoms of bleeding  Uvaldo Bristle, PharmD PGY1 Pharmacy Resident Pager: [phone] 01/02/2017 1:59 PM

## 2017-01-02 NOTE — Progress Notes (Addendum)
1329 - C/O chest pressure 5/10 non radiating associated with mild SOB. Ntg sl 0.4 mg given and O2 applied at 2 l via [place].

## 2017-01-03 ENCOUNTER — Inpatient Hospital Stay (HOSPITAL_COMMUNITY): Payer: PPO

## 2017-01-03 ENCOUNTER — Encounter (HOSPITAL_COMMUNITY)
Admission: EM | Disposition: A | Discharge status: Home / Self Care | Payer: Self-pay | Source: Home / Self Care | Attending: Cardiovascular Disease

## 2017-01-03 DIAGNOSIS — I2510 Atherosclerotic heart disease of native coronary artery without angina pectoris: Secondary | ICD-10-CM

## 2017-01-03 HISTORY — PX: CORONARY STENT INTERVENTION: CATH118234

## 2017-01-03 HISTORY — PX: LEFT HEART CATH AND CORONARY ANGIOGRAPHY: CATH118249

## 2017-01-03 HISTORY — PX: CORONARY ULTRASOUND/IVUS: CATH118244

## 2017-01-03 LAB — BASIC METABOLIC PANEL
ANION GAP: 8 (ref 5–15)
BUN: 11 mg/dL (ref 6–20)
CALCIUM: 8.8 mg/dL — AB (ref 8.9–10.3)
CHLORIDE: 106 mmol/L (ref 101–111)
CO2: 27 mmol/L (ref 22–32)
CREATININE: 0.75 mg/dL (ref 0.44–1.00)
GFR calc non Af Amer: >60 mL/min (ref >60)
Glucose, Bld: 119 mg/dL — ABNORMAL HIGH (ref 65–99)
Potassium: 4 mmol/L (ref 3.5–5.1)
SODIUM: 141 mmol/L (ref 135–145)

## 2017-01-03 LAB — HEMOGLOBIN A1C
Hgb A1c MFr Bld: 6.1 % — ABNORMAL HIGH (ref 4.8–5.6)
Mean Plasma Glucose: 128 mg/dL

## 2017-01-03 LAB — ECHOCARDIOGRAM COMPLETE
HEIGHTINCHES: 62 in
Weight: 2646.4 oz

## 2017-01-03 LAB — POCT ACTIVATED CLOTTING TIME
Activated Clotting Time: 257 seconds
Activated Clotting Time: 307 seconds
Activated Clotting Time: 340 seconds

## 2017-01-03 LAB — HEPARIN LEVEL (UNFRACTIONATED): Heparin Unfractionated: 0.39 IU/mL (ref 0.30–0.70)

## 2017-01-03 SURGERY — LEFT HEART CATH AND CORONARY ANGIOGRAPHY
Anesthesia: LOCAL

## 2017-01-03 MED ORDER — TICAGRELOR 90 MG PO TABS
ORAL_TABLET | ORAL | Status: DC | PRN
  Administered 2017-01-03: 180 mg via ORAL

## 2017-01-03 MED ORDER — TICAGRELOR 90 MG PO TABS
90.0000 mg | ORAL_TABLET | Freq: Two times a day (BID) | ORAL | Status: DC
  Administered 2017-01-03 – 2017-01-04 (×2): 90 mg via ORAL
  Filled 2017-01-03 (×2): qty 1

## 2017-01-03 MED ORDER — MIDAZOLAM HCL 2 MG/2ML IJ SOLN
INTRAMUSCULAR | Status: AC
  Filled 2017-01-03: qty 2

## 2017-01-03 MED ORDER — IOPAMIDOL (ISOVUE-370) INJECTION 76%
INTRAVENOUS | Status: AC
  Filled 2017-01-03: qty 50

## 2017-01-03 MED ORDER — TICAGRELOR 90 MG PO TABS
ORAL_TABLET | ORAL | Status: AC
  Filled 2017-01-03: qty 2

## 2017-01-03 MED ORDER — NITROGLYCERIN 1 MG/10 ML FOR IR/CATH LAB
INTRA_ARTERIAL | Status: AC
  Filled 2017-01-03: qty 10

## 2017-01-03 MED ORDER — ENOXAPARIN SODIUM 40 MG/0.4ML ~~LOC~~ SOLN
40.0000 mg | SUBCUTANEOUS | Status: DC
  Administered 2017-01-04: 40 mg via SUBCUTANEOUS
  Filled 2017-01-03: qty 0.4

## 2017-01-03 MED ORDER — ANGIOPLASTY BOOK
Freq: Once | Status: AC
  Administered 2017-01-03: 22:00:00
  Filled 2017-01-03: qty 1

## 2017-01-03 MED ORDER — FENTANYL CITRATE (PF) 100 MCG/2ML IJ SOLN
INTRAMUSCULAR | Status: DC | PRN
  Administered 2017-01-03: 25 ug via INTRAVENOUS

## 2017-01-03 MED ORDER — IOPAMIDOL (ISOVUE-370) INJECTION 76%
INTRAVENOUS | Status: AC
  Filled 2017-01-03: qty 125

## 2017-01-03 MED ORDER — HEPARIN (PORCINE) IN NACL 2-0.9 UNIT/ML-% IJ SOLN
INTRAMUSCULAR | Status: AC | PRN
  Administered 2017-01-03: 1000 mL

## 2017-01-03 MED ORDER — TIROFIBAN HCL IN NACL 5-0.9 MG/100ML-% IV SOLN
INTRAVENOUS | Status: DC | PRN
  Administered 2017-01-03: 0.15 ug/kg/min via INTRAVENOUS

## 2017-01-03 MED ORDER — HYDRALAZINE HCL 20 MG/ML IJ SOLN: 5.0000 mg | INTRAMUSCULAR | Status: AC | PRN

## 2017-01-03 MED ORDER — SODIUM CHLORIDE 0.9% FLUSH
3.0000 mL | Freq: Two times a day (BID) | INTRAVENOUS | Status: DC
  Administered 2017-01-03: 22:00:00 3 mL via INTRAVENOUS

## 2017-01-03 MED ORDER — LIDOCAINE HCL (PF) 1 % IJ SOLN
INTRAMUSCULAR | Status: DC | PRN
  Administered 2017-01-03: 2 mL

## 2017-01-03 MED ORDER — IOPAMIDOL (ISOVUE-370) INJECTION 76%
INTRAVENOUS | Status: DC | PRN
  Administered 2017-01-03: 175 mL via INTRA_ARTERIAL

## 2017-01-03 MED ORDER — ISOSORBIDE MONONITRATE ER 30 MG PO TB24
15.0000 mg | ORAL_TABLET | Freq: Every day | ORAL | Status: DC
Start: 2017-01-03 — End: 2017-01-04
  Administered 2017-01-03 – 2017-01-04 (×2): 15 mg via ORAL
  Filled 2017-01-03 (×2): qty 1

## 2017-01-03 MED ORDER — NITROGLYCERIN 1 MG/10 ML FOR IR/CATH LAB
INTRA_ARTERIAL | Status: DC | PRN
  Administered 2017-01-03 (×3): 200 ug via INTRACORONARY

## 2017-01-03 MED ORDER — VERAPAMIL HCL 2.5 MG/ML IV SOLN
INTRAVENOUS | Status: AC
  Filled 2017-01-03: qty 2

## 2017-01-03 MED ORDER — SODIUM CHLORIDE 0.9% FLUSH: 3.0000 mL | INTRAVENOUS | Status: DC | PRN

## 2017-01-03 MED ORDER — HEPARIN SODIUM (PORCINE) 1000 UNIT/ML IJ SOLN
INTRAMUSCULAR | Status: DC | PRN
  Administered 2017-01-03: 2000 [IU] via INTRAVENOUS
  Administered 2017-01-03 (×2): 4000 [IU] via INTRAVENOUS

## 2017-01-03 MED ORDER — FUROSEMIDE 10 MG/ML IJ SOLN
20.0000 mg | Freq: Every day | INTRAMUSCULAR | Status: DC
  Administered 2017-01-03: 13:00:00 20 mg via INTRAVENOUS
  Filled 2017-01-03: qty 2

## 2017-01-03 MED ORDER — TIROFIBAN HCL IN NACL 5-0.9 MG/100ML-% IV SOLN
INTRAVENOUS | Status: AC
  Filled 2017-01-03: qty 100

## 2017-01-03 MED ORDER — SODIUM CHLORIDE 0.9 % IV SOLN: 250.0000 mL | INTRAVENOUS | Status: DC | PRN

## 2017-01-03 MED ORDER — HEPARIN SODIUM (PORCINE) 1000 UNIT/ML IJ SOLN
INTRAMUSCULAR | Status: AC
  Filled 2017-01-03: qty 1

## 2017-01-03 MED ORDER — FENTANYL CITRATE (PF) 100 MCG/2ML IJ SOLN
INTRAMUSCULAR | Status: AC
  Filled 2017-01-03: qty 2

## 2017-01-03 MED ORDER — VERAPAMIL HCL 2.5 MG/ML IV SOLN
INTRAVENOUS | Status: DC | PRN
  Administered 2017-01-03: 10 mL via INTRA_ARTERIAL

## 2017-01-03 MED ORDER — MIDAZOLAM HCL 2 MG/2ML IJ SOLN
INTRAMUSCULAR | Status: DC | PRN
Start: 2017-01-03 — End: 2017-01-03
  Administered 2017-01-03: 1 mg via INTRAVENOUS

## 2017-01-03 MED ORDER — TIROFIBAN (AGGRASTAT) BOLUS VIA INFUSION
INTRAVENOUS | Status: DC | PRN
Start: 2017-01-03 — End: 2017-01-03
  Administered 2017-01-03: 1875 ug via INTRAVENOUS

## 2017-01-03 MED ORDER — HEART ATTACK BOUNCING BOOK
Freq: Once | Status: AC
  Administered 2017-01-03: 22:00:00
  Filled 2017-01-03: qty 1

## 2017-01-03 MED ORDER — HEPARIN (PORCINE) IN NACL 2-0.9 UNIT/ML-% IJ SOLN
INTRAMUSCULAR | Status: AC
Start: 2017-01-03 — End: 2017-01-03
  Filled 2017-01-03: qty 1000

## 2017-01-03 MED ORDER — LIDOCAINE HCL (PF) 1 % IJ SOLN
INTRAMUSCULAR | Status: AC
  Filled 2017-01-03: qty 30

## 2017-01-03 SURGICAL SUPPLY — 25 items
BALLN MOZEC 2.0X12 (BALLOONS) ×2
BALLN ~~LOC~~ EUPHORA RX 2.5X15 (BALLOONS) ×2
BALLN ~~LOC~~ EUPHORA RX 2.75X15 (BALLOONS) ×2
BALLN ~~LOC~~ MOZEC 2.25X15 (BALLOONS) ×2
BALLOON MOZEC 2.0X12 (BALLOONS) ×1 IMPLANT
BALLOON ~~LOC~~ EUPHORA RX 2.5X15 (BALLOONS) ×1 IMPLANT
BALLOON ~~LOC~~ EUPHORA RX 2.75X15 (BALLOONS) ×1 IMPLANT
BALLOON ~~LOC~~ MOZEC 2.25X15 (BALLOONS) ×1 IMPLANT
CATH 5FR JL3.5 JR4 ANG PIG MP (CATHETERS) ×2 IMPLANT
CATH LAUNCHER 6FR EBU 3 (CATHETERS) ×2 IMPLANT
CATH OPTICROSS 40MHZ (CATHETERS) ×2 IMPLANT
DEVICE RAD COMP TR BAND LRG (VASCULAR PRODUCTS) ×2 IMPLANT
GLIDESHEATH SLEND SS 6F .021 (SHEATH) ×2 IMPLANT
GUIDEWIRE INQWIRE 1.5J.035X260 (WIRE) ×1 IMPLANT
INQWIRE 1.5J .035X260CM (WIRE) ×2
KIT ENCORE 26 ADVANTAGE (KITS) ×2 IMPLANT
KIT HEART LEFT (KITS) ×2 IMPLANT
PACK CARDIAC CATHETERIZATION (CUSTOM PROCEDURE TRAY) ×2 IMPLANT
SLED PULL BACK IVUS (MISCELLANEOUS) ×2 IMPLANT
STENT RESOLUTE ONYX 2.0X22 (Permanent Stent) ×2 IMPLANT
STENT RESOLUTE ONYX 2.25X30 (Permanent Stent) ×2 IMPLANT
SYR MEDRAD MARK V 150ML (SYRINGE) ×2 IMPLANT
TRANSDUCER W/STOPCOCK (MISCELLANEOUS) ×2 IMPLANT
TUBING CIL FLEX 10 FLL-RA (TUBING) ×2 IMPLANT
WIRE RUNTHROUGH .014X180CM (WIRE) ×2 IMPLANT

## 2017-01-03 NOTE — Plan of Care (Signed)
Problem: Education: Goal: Knowledge of [facility] Education information/materials will improve Outcome: Progressing Pre-Cath education/prep  Problem: Safety: Goal: Ability to remain free from injury will improve Outcome: Progressing Family remains at bedside. Steady while ambulating to bedside.   Problem: Tissue Perfusion: Goal: Risk factors for ineffective tissue perfusion will decrease Outcome: Progressing Intermittent chest pain, resolved with sublingual nitro and 2L [place]. Aggravating factors were talking on phone.

## 2017-01-03 NOTE — Interval H&P Note (Signed)
History and Physical Interval Note:  01/03/2017 9:42 AM  Hannah Vasquez  has presented today for cardiac catheterization, with the diagnosis of NSTEMI. The various methods of treatment have been discussed with the patient and family. After consideration of risks, benefits and other options for treatment, the patient has consented to  Procedure(s): Left Heart Cath and Coronary Angiography (N/A) as a surgical intervention .  The patient's history has been reviewed, patient examined, no change in status, stable for surgery.  I have reviewed the patient's chart and labs.  Questions were answered to the patient's satisfaction.    Cath Lab Visit (complete for each Cath Lab visit)  Clinical Evaluation Leading to the Procedure:   ACS: Yes.   (NSTEMI)  Non-ACS:  N/A  Eben Choinski

## 2017-01-03 NOTE — Progress Notes (Signed)
Pt copay will be $45. Prior auth not required

## 2017-01-03 NOTE — Progress Notes (Signed)
Post Catheterization Note  Date: 01/03/17 Time: 6:15 PM  S: Patient assessed following PCI to LAD in the setting of NSTEMI. She reports feeling better but continues to have mild (1/10) chest discomfort and shortness of breath.  O: Temp:  [98.2 F (36.8 C)-98.8 F (37.1 C)] 98.2 F (36.8 C) (05/21 0300) Pulse Rate:  [54-71] 71 (05/21 1300) Resp:  [9-19] 17 (05/21 1300) BP: (116-172)/(57-85) 164/83 (05/21 1300) SpO2:  [96 %-100 %] 100 % (05/21 1300) Weight:  [75 kg (165 lb 6.4 oz)] 75 kg (165 lb 6.4 oz) (05/21 0300) Gen: Well-developed woman, lying in bed. Multiple family members are at the bedside. Lungs: Diminished breath sounds throughout without wheezes or crackles. Heart: Distant heart sounds. Regular without murmurs. Extremities: Right wrist with TR band in place. No bleeding or hematoma.  EKG: Post PCI EKG demonstrates NSR with RBBB and low voltage.  Echo: Normal LV size with LVEF 40-45% with anterior, anteroseptal, and apical hypokinesis. Grade 1 diastolic dysfunction. Trivial pericardial effusion.  A/P: 66 y/o woman admitted with NSTEMI complicated by acute systolic heart failure secondary to ischemic cardiomyopathy, s/p PCI to the LAD earlier today. CP has improved but is not entirely gone. This may in part be due to residual disease involving ramus and D1. I favor medical therapy of these lesions, given small vessel size. I will add isosorbide mononitrate 15 mg daily, to be uptitrated as tolerated given recent headaches. Continued diuresis will be helpful; pt had vigorous diuresis following furosemide 20 mg IV x 1 today. Addition of ACEI/ARB should be considered tomorrow based on BP and renal function.  Nelva Bush, MD [facility] Pager: [phone]

## 2017-01-03 NOTE — Care Management Note (Addendum)
Case Management Note  Patient Details  Name: Hannah SENSENEY MRN: [id number] Date of Birth: 05/31/1951  Subjective/Objective:   S/p stent intervention, will be on brilintal.  Gave patient co pay amt, and 30 day savings card ,she will get 30 day free from the [employer].  pta indep.                 Action/Plan: NCM awaiting benefit check for brilinta.  Expected Discharge Date:                  Expected Discharge Plan:  Home/Self Care  In-House Referral:     Discharge planning Services  CM Consult  Post Acute Care Choice:    Choice offered to:     DME Arranged:    DME Agency:     HH Arranged:    HH Agency:     Status of Service:  In process, will continue to follow  If discussed at Long Length of Stay Meetings, dates discussed:    Additional Comments:  Zenon Mayo, RN 01/03/2017, 2:46 PM

## 2017-01-03 NOTE — Consult Note (Signed)
           [facility] CM Primary Care Navigator  01/03/2017  Hannah Vasquez 1950/08/23 [id number]   Went to see patientat the bedside to identify possible discharge needsbut staffreports that patient is in Cath Lab for a procedure at this time.   Will attempt to meet with patient at another time when she is available in the room.   For questions, please contact:  Dannielle Huh, BSN, RN- [facility] Primary Care Navigator  Telephone: [phone] [place]

## 2017-01-03 NOTE — Progress Notes (Signed)
  Echocardiogram 2D Echocardiogram has been performed.  Hannah Vasquez 01/03/2017, 4:34 PM

## 2017-01-03 NOTE — Progress Notes (Addendum)
1255 patient complained of mid sternal chest pain described as pressure, burning, in the same area as pain was prior to procedure and started after transfer to this unit. Also feels short of breath. Answered yes when asked, does it feel like you can't catch your breath, lungs clear and O2sat 100 % on room air. Increased pain mid sternal chest with palpation. Patient given coke for possible side effect from Brilinta. Lasix given for fluid overload. Patient short of breath with ambulation to bathroom to void, O2sats remain at 100% on room air after ambulation. Patient states that symptoms are subsiding, chest pressure, burning, nausea and shortness of breath. Voided 700 mls clear yellow urine.  1425 Patient states she feels better, voided 700 mls additional clear yellow urine. Shortness of breath continues, encouraged to drink coke to decrease side effect from Brilinta. 1540 Patient states chest pressure/burning 1/10. 900 mls clear yellow urine, good result from 20 IV lasix, resting quietly.

## 2017-01-03 NOTE — Progress Notes (Signed)
ANTICOAGULATION CONSULT NOTE - Follow-up Consult  Pharmacy Consult for IV heparin Indication: chest pain/ACS  Allergies  Allergen Reactions  . Codeine     REACTION: nausea and vomiting  . Penicillins     REACTION: rash    Patient Measurements: Height: 5' 2"  (157.5 cm) Weight: 165 lb 6.4 oz (75 kg) IBW/kg (Calculated) : 50.1 Heparin Dosing Weight: 66.3 kg   Vital Signs: Temp: 98.2 F (36.8 C) (05/21 0300) Temp Source: Oral (05/21 0300) BP: 142/70 (05/21 0300) Pulse Rate: 67 (05/21 0300)  Labs:  Recent Labs  01/01/17 1900  01/01/17 2144  01/02/17 0144 01/02/17 0641 01/02/17 1241 01/03/17 0413  HGB 12.7  --   --   --   --  12.0  --   --   HCT 38.8  --   --   --   --  37.0  --   --   PLT 292  --   --   --   --  239  --   --   APTT  --   --  160*  --   --   --   --   --   LABPROT  --   --  14.5  --   --  14.4  --   --   INR  --   --  1.13  --   --  1.12  --   --   HEPARINUNFRC  --   --   --   < > 0.24* 0.31 0.30 0.39  CREATININE 0.89  --   --   --   --  0.74  --  0.75  TROPONINI  --   < > 0.39*  --  0.47* 0.58* 1.43*  --   < > = values in this interval not displayed.  Estimated Creatinine Clearance: 63.9 mL/min (by C-G formula based on SCr of 0.75 mg/dL).  Assessment: 66 y.o. F on heparin for NSTEMI.  Heparin level therapeutic, no bleeding noted  Goal of Therapy:  Heparin level 0.3-0.7 units/ml Monitor platelets by anticoagulation protocol: Yes   Plan: Continue heparin at 1000 units / hr Follow up after cath  Thank you Anette Guarneri, PharmD [phone] 01/03/2017 8:23 AM

## 2017-01-03 NOTE — H&P (View-Only) (Signed)
Progress Note  Patient Name: Hannah Vasquez Date of Encounter: 01/02/2017  Primary Cardiologist: Marlou Porch (2015)  Subjective   She has had another episode of chest pressure around 7:30 this morning. Resolved within a few minutes of receiving acetaminophen. Right now pain-free  Describes accelerating angina pectoris for the last 2 weeks.  Inpatient Medications    Scheduled Meds: . albuterol  2.5 mg Inhalation Q4H  . aspirin EC  81 mg Oral q morning - 10a  . atorvastatin  80 mg Oral q1800  . calcium-vitamin D  1 tablet Oral Q breakfast  . cholecalciferol  1,000 Units Oral q morning - 10a  . hydrochlorothiazide  12.5 mg Oral q morning - 10a  . loratadine  10 mg Oral q morning - 10a  . metoprolol succinate  50 mg Oral Daily  . multivitamin with minerals  1 tablet Oral q morning - 10a  . omega-3 acid ethyl esters  1 g Oral Daily  . pantoprazole  40 mg Oral Daily  . pregabalin  75 mg Oral BID   Continuous Infusions: . heparin 950 Units/hr (01/02/17 0315)   PRN Meds: acetaminophen, meclizine, nitroGLYCERIN, ondansetron (ZOFRAN) IV   Vital Signs    Vitals:   01/01/17 2230 01/01/17 2300 01/02/17 0005 01/02/17 0300  BP: (!) 139/55 134/63 (!) 138/52 127/63  Pulse: 63 62 64 (!) 57  Resp: 20 16 17 17   Temp:   98.4 F (36.9 C) 98.2 F (36.8 C)  TempSrc:   Oral Oral  SpO2: 95% 96% 98% 95%  Weight:   166 lb 9.6 oz (75.6 kg)   Height:   5' 2"  (1.575 m)     Intake/Output Summary (Last 24 hours) at 01/02/17 0755 Last data filed at 01/02/17 0300  Gross per 24 hour  Intake            53.33 ml  Output                0 ml  Net            53.33 ml   Filed Weights   01/01/17 1945 01/02/17 0005  Weight: 165 lb (74.8 kg) 166 lb 9.6 oz (75.6 kg)    Telemetry    Sinus rhythm - Personally Reviewed  ECG    Sinus rhythm, old right bundle branch block, nonspecific ST depression in the lateral leads, similar to previous tracing from August 2017 - Personally Reviewed  Physical  Exam  Mildly obese GEN: No acute distress.   Neck: No JVD Cardiac: RRR, widely split S2, no murmurs, rubs, or gallops.  Respiratory: Clear to auscultation bilaterally. GI: Soft, nontender, non-distended  MS: No edema; No deformity. Neuro:  Nonfocal  Psych: Normal affect   Labs    Chemistry Recent Labs Lab 01/01/17 1900  NA 141  K 2.8*  CL 104  CO2 25  GLUCOSE 173*  BUN 15  CREATININE 0.89  CALCIUM 8.9  GFRNONAA >60  GFRAA >60  ANIONGAP 12     Hematology Recent Labs Lab 01/01/17 1900 01/02/17 0641  WBC 10.4 10.2  RBC 4.79 4.52  HGB 12.7 12.0  HCT 38.8 37.0  MCV 81.0 81.9  MCH 26.5 26.5  MCHC 32.7 32.4  RDW 14.5 14.7  PLT 292 239    Cardiac Enzymes Recent Labs Lab 01/01/17 2144 01/02/17 0144  TROPONINI 0.39* 0.47*    Recent Labs Lab 01/01/17 1910  TROPIPOC 0.16*     BNPNo results for input(s): BNP, PROBNP in the  last 168 hours.   DDimer  Recent Labs Lab 01/01/17 2144  DDIMER <0.27     Radiology    Dg Chest 2 View  Result Date: 01/01/2017 CLINICAL DATA:  Chest pain 4 2 weeks EXAM: CHEST  2 VIEW COMPARISON:  09/30/2016 FINDINGS: Cardiac shadow is stable. The lungs are well aerated bilaterally. Minimal left basilar atelectasis is seen. No sizable effusion is noted. No acute bony abnormality is seen. Old left rib fracture is noted. IMPRESSION: Minimal left basilar atelectasis. Electronically Signed   By: Inez Catalina M.D.   On: 01/01/2017 19:02    Cardiac Studies   ECG shows normal sinus rhythm, old right bundle branch block, pre-existing nonspecific lateral ST segment depression  Patient Profile     66 y.o. female without previous known coronary disease presents with unstable angina pectoris and abnormal cardiac enzymes. Also has severe hypokalemia that is likely related to chronic thiazide diuretic therapy.  Assessment & Plan    1. NSTEMI - Small amount of myocardial injury so far, but symptoms are compelling for unstable angina  pectoris. I would proceed directly to coronary angiography. Percutaneous revascularization with recent of stent if indicated.This procedure has been fully reviewed with the patient and written informed consent has been obtained. Meanwhile continue intravenous heparin and will add long-acting nitrates. She is already taking beta blockers. Recent lipid profile showed a fairly normal LDL cholesterol at 82, but very low HDL at 23. We'll place on high-dose statin regardless. 2. HypoK - secondary to thiazide diuretic. Will stop this medication. Consider adding RAAS inhibitor after cath, especially if there is any abnormality in LV function. 3. HTN - fair control 4. RBBB - pre-existing 5. MVP - trivial posterior leaflet prolapse and minimal mitral regurgitation by echo 2015, not evident on physical exam   Signed, Sanda Klein, MD  01/02/2017, 7:55 AM

## 2017-01-03 NOTE — Brief Op Note (Signed)
Brief Cardiac Catheterization Note (Full Report to Follow)  Date: 01/03/2017 Time: 11:43 AM  PATIENT:  Hannah Vasquez  66 y.o. female  PRE-OPERATIVE DIAGNOSIS:  NSTEMI  POST-OPERATIVE DIAGNOSIS:  Same  PROCEDURE:  Procedure(s): Left Heart Cath and Coronary Angiography (N/A)  SURGEON:  Surgeon(s) and Role:    Nelva Bush, MD - Primary  FINDINGS: 1. Significant 2-vessel coronary artery disease, including sequential 99% and 80% mid LAD stenoses with TIMI-2 flow, 90% proximal ramus stenosis (small vessel), and 50-60% mid LCx disease. 20% proximal/mid RCA disease is also noted. 2. Moderately elevated LVEDP. 3. Moderately reduced LVEF with anterior and apical akinesis. 4. Successful PCI to mid and distal LAD with 2 overlapping Resolute Onyx drug-eluting stents.  RECOMMENDATIONS: 1. DAPT with ASA and ticagrelor for at least 12 months, ideally longer. 2. Aggressive secondary prevention. 3. Echo to further evaluate LVEF. 4. Medical therapy for ischemic cardiomyopathy, including diuresis.  Nelva Bush, MD [facility] Pager: [phone]

## 2017-01-04 ENCOUNTER — Encounter (HOSPITAL_COMMUNITY): Payer: Self-pay | Admitting: Internal Medicine

## 2017-01-04 ENCOUNTER — Telehealth: Payer: Self-pay | Admitting: Cardiology

## 2017-01-04 ENCOUNTER — Telehealth: Payer: Self-pay

## 2017-01-04 DIAGNOSIS — I255 Ischemic cardiomyopathy: Secondary | ICD-10-CM

## 2017-01-04 DIAGNOSIS — I420 Dilated cardiomyopathy: Secondary | ICD-10-CM

## 2017-01-04 LAB — CBC
HCT: 38 % (ref 36.0–46.0)
HEMOGLOBIN: 12 g/dL (ref 12.0–15.0)
MCH: 26.1 pg (ref 26.0–34.0)
MCHC: 31.6 g/dL (ref 30.0–36.0)
MCV: 82.8 fL (ref 78.0–100.0)
PLATELETS: 280 10*3/uL (ref 150–400)
RBC: 4.59 MIL/uL (ref 3.87–5.11)
RDW: 14.8 % (ref 11.5–15.5)
WBC: 11.4 10*3/uL — ABNORMAL HIGH (ref 4.0–10.5)

## 2017-01-04 LAB — BASIC METABOLIC PANEL
Anion gap: 9 (ref 5–15)
BUN: 9 mg/dL (ref 6–20)
CALCIUM: 9 mg/dL (ref 8.9–10.3)
CO2: 27 mmol/L (ref 22–32)
CREATININE: 0.99 mg/dL (ref 0.44–1.00)
Chloride: 104 mmol/L (ref 101–111)
GFR calc Af Amer: >60 mL/min (ref >60)
GFR calc non Af Amer: 57 mL/min — ABNORMAL LOW (ref >60)
Glucose, Bld: 115 mg/dL — ABNORMAL HIGH (ref 65–99)
Potassium: 3.4 mmol/L — ABNORMAL LOW (ref 3.5–5.1)
Sodium: 140 mmol/L (ref 135–145)

## 2017-01-04 MED ORDER — NITROGLYCERIN 0.4 MG SL SUBL: 0.4000 mg | SUBLINGUAL_TABLET | SUBLINGUAL | 3 refills | Status: DC | PRN

## 2017-01-04 MED ORDER — ISOSORBIDE MONONITRATE ER 30 MG PO TB24: 15.0000 mg | ORAL_TABLET | Freq: Every day | ORAL | 3 refills | Status: DC

## 2017-01-04 MED ORDER — FUROSEMIDE 20 MG PO TABS
20.0000 mg | ORAL_TABLET | Freq: Every day | ORAL | Status: DC
Start: 2017-01-04 — End: 2017-01-04
  Administered 2017-01-04: 09:00:00 20 mg via ORAL
  Filled 2017-01-04: qty 1

## 2017-01-04 MED ORDER — LOSARTAN POTASSIUM 25 MG PO TABS: 25.0000 mg | ORAL_TABLET | Freq: Every day | ORAL | 3 refills | Status: DC

## 2017-01-04 MED ORDER — TICAGRELOR 90 MG PO TABS: 90.0000 mg | ORAL_TABLET | Freq: Two times a day (BID) | ORAL | 0 refills | Status: DC

## 2017-01-04 MED ORDER — TICAGRELOR 90 MG PO TABS: 90.0000 mg | ORAL_TABLET | Freq: Two times a day (BID) | ORAL | 3 refills | Status: DC

## 2017-01-04 MED ORDER — FUROSEMIDE 20 MG PO TABS: 20.0000 mg | ORAL_TABLET | Freq: Every day | ORAL | 3 refills | Status: DC

## 2017-01-04 MED ORDER — POTASSIUM CHLORIDE CRYS ER 10 MEQ PO TBCR: 10.0000 meq | EXTENDED_RELEASE_TABLET | Freq: Every day | ORAL | 3 refills | Status: DC

## 2017-01-04 MED ORDER — ATORVASTATIN CALCIUM 80 MG PO TABS: 80.0000 mg | ORAL_TABLET | Freq: Every day | ORAL | 3 refills | Status: DC

## 2017-01-04 MED ORDER — POTASSIUM CHLORIDE CRYS ER 20 MEQ PO TBCR
20.0000 meq | EXTENDED_RELEASE_TABLET | Freq: Every day | ORAL | Status: DC
  Administered 2017-01-04: 09:00:00 20 meq via ORAL
  Filled 2017-01-04: qty 1

## 2017-01-04 MED ORDER — LOSARTAN POTASSIUM 50 MG PO TABS
25.0000 mg | ORAL_TABLET | Freq: Every day | ORAL | Status: DC
  Administered 2017-01-04: 25 mg via ORAL
  Filled 2017-01-04: qty 1

## 2017-01-04 NOTE — Telephone Encounter (Signed)
New message    1-2 week TOC appt per Janace Hoard Duke with [facility] Merle on 5/30 at 1130am.

## 2017-01-04 NOTE — Consult Note (Signed)
           [facility] CM Primary Care Navigator  01/04/2017  ADAYA GARMANY 02/28/1951 [id number]   Jeryl Columbia to see patient at her new room location to identify possible discharge needs but she was alreadydischarged.  Patient was discharged home per staff.    Primary care provider's office called (Rena)to notify of patient's discharge and need for post hospital follow-up and transition of care. Notified also of health issues needing follow-up. Made aware to refer patient to [facility] care management ifdeemed necessaryfor services.   For questions, please contact:  Dannielle Huh, BSN, RN- [facility] Primary Care Navigator  Telephone: [phone] [place]

## 2017-01-04 NOTE — Discharge Summary (Signed)
Discharge Summary    Patient ID: Hannah Vasquez,  MRN: [id number], DOB/AGE: 1950-09-25 66 y.o.  Admit date: 01/01/2017 Discharge date: 01/04/2017   Primary Care Provider: Lucille Passy Primary Cardiologist: Dr. Marlou Porch  Discharge Diagnoses    Principal Problem:   NSTEMI (non-ST elevated myocardial infarction) [facility]) Active Problems:   Anxiety state   HYPERTENSION, BENIGN   Allergies Allergies  Allergen Reactions  . Codeine     REACTION: nausea and vomiting  . Penicillins     REACTION: rash     History of Present Illness     The pt is a 66yo female with PMH significant for HTN, MVP, a chronic RBBB, and a recent colonoscopy who presented to the [facility] emergency department on 01/02/17 with substernal CP. She had been experiencing intermittent CP for the past two weeks, described as a "pressure" over the center of her chest, and occurring both at rest and with exertion.  The discomfort would typically last several minutes before self-resolving, and was intermittently associated with mild nausea.  On the day of presentation she was at rest when the CP returned but did not resolve, prompting presentation to the OSH ED. The discomfort then waxed and waned for several hours before fully resolving, but was not appreciably improved after receiving either SLNG or fentanyl.  Initial w/u was remarkable for a hypokalemia with K=2.8 and a troponin of 0.16.  Given this mildly elevated troponin and risk factors of age and HTN she was started on a heparin gtt and given a full dose ASA, with subsequent request for admission to [facility] for further management.  On arrival to [facility] she was CP free and had no other acute complaints  Hospital Course     Consultants: none   She was taken to the cath lab and received 2 stents to her LAD. The patient tolerated the procedure well and has ambulated in the halls without complications. Echocardiogram with ischemic cardiomyopathy and a reduced EF of  40-45%. She was started on lipitor, brilinta, imdur, lasix/K, and losartan at discharge and continued toprol.  Spironolactone was not yet started. Given these new medications, her home HCTZ was held at this time. She was instructed to weigh daily and to record daily blood pressure readings.  Patient seen and examined by Dr. Marlou Porch today and was stable for discharge. All follow up has been arranged.  _____________  Discharge Vitals Blood pressure (!) 149/79, pulse 95, temperature 97.9 F (36.6 C), temperature source Oral, resp. rate (!) 26, height 5' 2"  (1.575 m), weight 160 lb 15 oz (73 kg), SpO2 98 %.  Filed Weights   01/02/17 0005 01/03/17 0300 01/04/17 0609  Weight: 166 lb 9.6 oz (75.6 kg) 165 lb 6.4 oz (75 kg) 160 lb 15 oz (73 kg)    Labs & Radiologic Studies    CBC  Recent Labs  01/02/17 0641 01/04/17 0231  WBC 10.2 11.4*  HGB 12.0 12.0  HCT 37.0 38.0  MCV 81.9 82.8  PLT 239 182   Basic Metabolic Panel  Recent Labs  01/01/17 2144  01/03/17 0413 01/04/17 0231  NA  --   < > 141 140  K  --   < > 4.0 3.4*  CL  --   < > 106 104  CO2  --   < > 27 27  GLUCOSE  --   < > 119* 115*  BUN  --   < > 11 9  CREATININE  --   < >  0.75 0.99  CALCIUM  --   < > 8.8* 9.0  MG 2.0  --   --   --   < > = values in this interval not displayed. Liver Function Tests No results for input(s): AST, ALT, ALKPHOS, BILITOT, PROT, ALBUMIN in the last 72 hours. No results for input(s): LIPASE, AMYLASE in the last 72 hours. Cardiac Enzymes  Recent Labs  01/02/17 0144 01/02/17 0641 01/02/17 1241  TROPONINI 0.47* 0.58* 1.43*   BNP Invalid input(s): POCBNP D-Dimer  Recent Labs  01/01/17 2144  DDIMER <0.27   Hemoglobin A1C  Recent Labs  01/02/17 0144  HGBA1C 6.1*   Fasting Lipid Panel  Recent Labs  01/02/17 0144  CHOL 144  HDL 23*  LDLCALC 82  TRIG 193*  CHOLHDL 6.3   Thyroid Function Tests No results for input(s): TSH, T4TOTAL, T3FREE, THYROIDAB in the last 72  hours.  Invalid input(s): FREET3 _____________  Dg Chest 2 View  Result Date: 01/01/2017 CLINICAL DATA:  Chest pain 4 2 weeks EXAM: CHEST  2 VIEW COMPARISON:  09/30/2016 FINDINGS: Cardiac shadow is stable. The lungs are well aerated bilaterally. Minimal left basilar atelectasis is seen. No sizable effusion is noted. No acute bony abnormality is seen. Old left rib fracture is noted. IMPRESSION: Minimal left basilar atelectasis. Electronically Signed   By: Inez Catalina M.D.   On: 01/01/2017 19:02     Diagnostic Studies/Procedures    Cath 01/03/17 Conclusions: 1. Significant 2-vessel coronary artery disease, including diffuse LAD disease with up to 99% stenosis in the mid portion and TIMI-2 flow, as well as 90% small ramus lesion and 40% proximal/mid LCx disease. 2. Moderately reduced LV contraction (LVEF 35-45%) with anterior and apical akinesis. 3. Moderately elevated left ventricular filling pressure. 4. Successful IVUS-guided PCI to the mid and distal LAD with placement of 2 overlapping Resolute Onyx drug-eluting stents with 0% residual stenosis and TIMI-3 flow.  Recommendations: 1. Dual antiplatelet therapy with aspirin and ticagrelor for at least 12 months, ideally longer. 2. Aggressive secondary prevention. 3. Medical therapy for small ramus intermedius and jailed diagonal branch, as PCI would be challenging given small vessel size. 4. Diuresis and titration of evidence-based heart failure therapy, given moderately reduced LV contraction with elevated filling pressure.    Echocardiogram 01/03/17: Study Conclusions - Left ventricle: The cavity size was normal. Wall thickness was   normal. Systolic function was mildly to moderately reduced. The   estimated ejection fraction was in the range of 40% to 45%.   Hypokinesis of the mid-apicalanteroseptal, anterior, and apical   myocardium. Doppler parameters are consistent with abnormal left   ventricular relaxation (grade 1 diastolic  dysfunction). Doppler   parameters are consistent with elevated mean left atrial filling   pressure. - Pericardium, extracardiac: A trivial pericardial effusion was   identified.   Disposition   Pt is being discharged home today in good condition.  Follow-up Plans & Appointments    Follow-up Information    Burtis Junes, NP Follow up on 01/12/2017.   Specialties:  Nurse Practitioner, Interventional Cardiology, Cardiology, Radiology Why:  11:30am for hospital follow up and TOC appt Contact information: [place]. [address] [place] [address] [phone]          Discharge Instructions    Amb Referral to Cardiac Rehabilitation    Complete by:  As directed    Diagnosis:   Coronary Stents NSTEMI     Diet - low sodium heart healthy    Complete  by:  As directed    Discharge instructions    Complete by:  As directed    No driving for 1 week. No lifting over 10 lbs for 2 weeks. No sexual activity for 2 weeks. You may return to work in 1 week. Keep procedure site clean & dry. If you notice increased pain, swelling, bleeding or pus, call/return!  You may shower, but no soaking baths/hot tubs/pools for 1 week.   Increase activity slowly    Complete by:  As directed       Discharge Medications   Current Discharge Medication List    START taking these medications   Details  atorvastatin (LIPITOR) 80 MG tablet Take 1 tablet (80 mg total) by mouth daily at 6 PM. Qty: 30 tablet, Refills: 3    furosemide (LASIX) 20 MG tablet Take 1 tablet (20 mg total) by mouth daily. Qty: 30 tablet, Refills: 3    isosorbide mononitrate (IMDUR) 30 MG 24 hr tablet Take 0.5 tablets (15 mg total) by mouth daily. Qty: 30 tablet, Refills: 3    losartan (COZAAR) 25 MG tablet Take 1 tablet (25 mg total) by mouth daily. Qty: 30 tablet, Refills: 3    nitroGLYCERIN (NITROSTAT) 0.4 MG SL tablet Place 1 tablet (0.4 mg total) under the tongue every 5 (five) minutes as needed for chest  pain. Qty: 25 tablet, Refills: 3    potassium chloride SA (K-DUR,KLOR-CON) 10 MEQ tablet Take 1 tablet (10 mEq total) by mouth daily. Qty: 30 tablet, Refills: 3    ticagrelor (BRILINTA) 90 MG TABS tablet Take 1 tablet (90 mg total) by mouth 2 (two) times daily. Qty: 60 tablet, Refills: 0      CONTINUE these medications which have NOT CHANGED   Details  aspirin EC 81 MG tablet Take 81 mg by mouth every morning.     Calcium Carbonate-Vit D-Min 600-400 MG-UNIT TABS Take 1 tablet by mouth every morning.     Cholecalciferol (VITAMIN D3) 1000 UNITS CAPS Take 1 capsule by mouth every morning.     LYRICA 75 MG capsule TAKE ONE CAPSULE BY MOUTH TWICE A DAY Qty: 60 capsule, Refills: 0    metoprolol succinate (TOPROL-XL) 50 MG 24 hr tablet Take 1 tablet (50 mg total) by mouth daily. Take with or immediately following a meal. Qty: 90 tablet, Refills: 3    Multiple Vitamin (MULTIVITAMIN) tablet Take 1 tablet by mouth every morning.     Omega-3 Fatty Acids (FISH OIL) 1200 MG CAPS Take 1 capsule by mouth every morning.     omeprazole (PRILOSEC) 40 MG capsule     doxycycline (VIBRA-TABS) 100 MG tablet Take 1 tablet (100 mg total) by mouth 2 (two) times daily. Qty: 14 tablet, Refills: 0    fluocinonide-emollient (LIDEX-E) 0.05 % cream Apply 1 application topically daily as needed (to legs for rash). Qty: 30 g, Refills: 2    loratadine (CLARITIN) 10 MG tablet Take 1 tablet (10 mg total) by mouth every morning. Qty: 30 tablet, Refills: 2    meclizine (ANTIVERT) 25 MG tablet Take 1 tablet (25 mg total) by mouth every 4 (four) hours as needed for dizziness. Qty: 60 tablet, Refills: 0    nystatin (MYCOSTATIN) 100000 UNIT/ML suspension Take 5 mLs (500,000 Units total) by mouth 4 (four) times daily. Qty: 100 mL, Refills: 0    PROAIR HFA 108 (90 Base) MCG/ACT inhaler INHALE 2 PUFFS INTO THE LUNGS EVERY 4 HOURS AS NEEDED Qty: 8.5 g, Refills: 5  promethazine-dextromethorphan (PROMETHAZINE-DM)  6.25-15 MG/5ML syrup Take 5 mLs by mouth 4 (four) times daily as needed. Qty: 118 mL, Refills: 0      STOP taking these medications     hydrochlorothiazide (MICROZIDE) 12.5 MG capsule      naproxen (NAPROSYN) 500 MG tablet          Aspirin prescribed at discharge?  Yes High Intensity Statin Prescribed? (Lipitor 40-26m or Crestor 20-470m: Yes Beta Blocker Prescribed? Yes For EF <40%, was ACEI/ARB Prescribed? Yes ADP Receptor Inhibitor Prescribed? (i.e. Plavix etc.-Includes Medically Managed Patients): Yes For EF <40%, Aldosterone Inhibitor Prescribed? No: assess BP Was EF assessed during THIS hospitalization? Yes Was Cardiac Rehab II ordered? (Included Medically managed Patients): Yes   Outstanding Labs/Studies   BMP for K at follow up.  Repeat echocardiogram in 3 months  Duration of Discharge Encounter   Greater than 30 minutes including physician time.  Signed, AnTami Linuke PA-C 01/04/2017, 9:13 AM  Personally seen and examined. Agree with above.  Primary Cardiologist: SkMarlou PorchSubjective   Mild Dyspnea on exertion, no chest pressure.   Inpatient Medications    Scheduled Meds: . aspirin EC  81 mg Oral Daily  . atorvastatin  80 mg Oral q1800  . calcium-vitamin D  1 tablet Oral Q breakfast  . cholecalciferol  1,000 Units Oral q morning - 10a  . enoxaparin (LOVENOX) injection  40 mg Subcutaneous Q24H  . furosemide  20 mg Intravenous Daily  . isosorbide mononitrate  15 mg Oral Daily  . loratadine  10 mg Oral q morning - 10a  . metoprolol succinate  50 mg Oral Daily  . multivitamin with minerals  1 tablet Oral q morning - 10a  . omega-3 acid ethyl esters  1 g Oral Daily  . pantoprazole  40 mg Oral Daily  . pregabalin  75 mg Oral BID  . sodium chloride flush  3 mL Intravenous Q12H  . ticagrelor  90 mg Oral BID   Continuous Infusions: . sodium chloride     PRN Meds: sodium chloride, acetaminophen, albuterol, meclizine, nitroGLYCERIN, ondansetron  (ZOFRAN) IV, sodium chloride flush   Vital Signs          Vitals:   01/03/17 1500 01/03/17 1600 01/03/17 2006 01/04/17 0609  BP: (!) 151/76 (!) 147/67 (!) 140/50 140/76  Pulse: 74 73 79 81  Resp: 20 15 19 18   Temp:   98.2 F (36.8 C) 97.8 F (36.6 C)  TempSrc:   Oral Oral  SpO2: 96% 98% 96% 96%  Weight:    160 lb 15 oz (73 kg)  Height:        Intake/Output Summary (Last 24 hours) at 01/04/17 0829 Last data filed at 01/04/17 [id number] per 24 hour  Intake             1200 ml  Output             2700 ml  Net            -1500 ml        Filed Weights   01/02/17 0005 01/03/17 0300 01/04/17 0609  Weight: 166 lb 9.6 oz (75.6 kg) 165 lb 6.4 oz (75 kg) 160 lb 15 oz (73 kg)    Telemetry    NSR - Personally Reviewed  ECG    NSR - Personally Reviewed  Physical Exam   GEN:No acute distress.   Neck:No JVD Cardiac:RRR, no murmurs, rubs, or gallops.  Respiratory:Clear to auscultation bilaterally. GIYQ:MVHQ  nontender, non-distended  MS:No edema; No deformity. Cath site normal Neuro:Nonfocal  Psych: Normal affect   Labs    Chemistry Last Labs    Recent Labs Lab 01/02/17 0641 01/03/17 0413 01/04/17 0231  NA 142 141 140  K 3.3* 4.0 3.4*  CL 105 106 104  CO2 28 27 27   GLUCOSE 112* 119* 115*  BUN 13 11 9   CREATININE 0.74 0.75 0.99  CALCIUM 8.5* 8.8* 9.0  GFRNONAA >60 >60 57*  GFRAA >60 >60 >60  ANIONGAP 9 8 9        Hematology Last Labs    Recent Labs Lab 01/01/17 1900 01/02/17 0641 01/04/17 0231  WBC 10.4 10.2 11.4*  RBC 4.79 4.52 4.59  HGB 12.7 12.0 12.0  HCT 38.8 37.0 38.0  MCV 81.0 81.9 82.8  MCH 26.5 26.5 26.1  MCHC 32.7 32.4 31.6  RDW 14.5 14.7 14.8  PLT 292 239 280      Cardiac Enzymes Last Labs    Recent Labs Lab 01/01/17 2144 01/02/17 0144 01/02/17 0641 01/02/17 1241  TROPONINI 0.39* 0.47* 0.58* 1.43*      Recent Labs Lab 01/01/17 1910  TROPIPOC 0.16*     BNP Last Labs   No results  for input(s): BNP, PROBNP in the last 168 hours.     DDimer  Last Labs    Recent Labs Lab 01/01/17 2144  DDIMER <0.27       Radiology    Imaging Results (Last 48 hours)  No results found.    Cardiac Studies   Cath 01/03/17  Conclusions: 5. Significant 2-vessel coronary artery disease, including diffuse LAD disease with up to 99% stenosis in the mid portion and TIMI-2 flow, as well as 90% small ramus lesion and 40% proximal/mid LCx disease. 6. Moderately reduced LV contraction (LVEF 35-45%) with anterior and apical akinesis. 7. Moderately elevated left ventricular filling pressure. 8. Successful IVUS-guided PCI to the mid and distal LAD with placement of 2 overlapping Resolute Onyx drug-eluting stents with 0% residual stenosis and TIMI-3 flow.  Recommendations: 5. Dual antiplatelet therapy with aspirin and ticagrelor for at least 12 months, ideally longer. 6. Aggressive secondary prevention. 7. Medical therapy for small ramus intermedius and jailed diagonal branch, as PCI would be challenging given small vessel size. 8. Diuresis and titration of evidence-based heart failure therapy, given moderately reduced LV contraction with elevated filling pressure.   Patient Profile     66 y.o. female with non-ST elevation myocardial infarction, ischemic cardio myopathy ejection fraction approximate 40% with anterior and apical akinesis with ECI to the mid LAD 2 separate long stents, DES  Assessment & Plan    Non-ST elevation myocardial infarction  - PCI to LAD 2  - Residual small caliber ramus lesion, medically managed.  - Continue dual antiplatelet therapy, high intensity statin, beta blocker  Ischemic cardiomyopathy/dilated cardio myopathy  - Beta blocker, we will add angiotensin receptor blocker, losartan 25 mg once a day  - EF approximate 40%  - We will reassess ejection fraction in approximately 3 months  - Adding Lasix 20 mg once a day. LVEDP was elevated. Mild  dyspnea on exertion noted.  Obesity  - Encourage weight loss  Hyperlipidemia  - Statin  Right bundle branch block  - Chronic  Essential hypertension  - Adding losartan 25 mg. Lasix 20 mg.  Okay with discharge with close clinic follow-up in 7 days TOC with me or APP  Signed, Candee Furbish, MD

## 2017-01-04 NOTE — Progress Notes (Signed)
CARDIAC REHAB PHASE I   PRE:  Rate/Rhythm: 92 SR  BP:  Supine:   Sitting: 149/79  Standing:    SaO2: 99%RA  MODE:  Ambulation: 540 ft   POST:  Rate/Rhythm: 108-112 ST  BP:  Supine:   Sitting: 183/79  Standing:    SaO2: 100%RA 0750-0915 Pt walked 540 ft with steady gait. A little lightheaded and a little SOB. Chest pressure light and did not increase with activity. MI and CHF ed completed with pt and daughters. Stressed importance of brilinta with stent, 2000 mg sodium restriction, daily weights. Reviewed NTG use, MI restrictions, watching carbs with A1C of 6.1, ex ed, signs of CHF. Discussed CRP 2 and will refer to [facility]. Dr Marlou Porch in to see pt and aware of BP and slightly SOB.   Hannah Good, RN BSN  01/04/2017 9:11 AM

## 2017-01-04 NOTE — Telephone Encounter (Signed)
Primary care navigator with [facility] left v/m that pt was discharged from [facility] 01/04/17 to home and will need call for TCM with f/u appt with Dr Deborra Medina. While in the hospital pt developed hypocalemia, low K. A potassium f/u labs and repeat EKG in 3 months. If pt has any [facility] care needs that is determined by the doctor refer pt to [facility]. FYI to Dr Deborra Medina and Earl Lagos for TCM.

## 2017-01-04 NOTE — Progress Notes (Signed)
Progress Note  Patient Name: Hannah Vasquez Date of Encounter: 01/04/2017  Primary Cardiologist: Marlou Porch  Subjective   Mild Dyspnea on exertion, no chest pressure.   Inpatient Medications    Scheduled Meds: . aspirin EC  81 mg Oral Daily  . atorvastatin  80 mg Oral q1800  . calcium-vitamin D  1 tablet Oral Q breakfast  . cholecalciferol  1,000 Units Oral q morning - 10a  . enoxaparin (LOVENOX) injection  40 mg Subcutaneous Q24H  . furosemide  20 mg Intravenous Daily  . isosorbide mononitrate  15 mg Oral Daily  . loratadine  10 mg Oral q morning - 10a  . metoprolol succinate  50 mg Oral Daily  . multivitamin with minerals  1 tablet Oral q morning - 10a  . omega-3 acid ethyl esters  1 g Oral Daily  . pantoprazole  40 mg Oral Daily  . pregabalin  75 mg Oral BID  . sodium chloride flush  3 mL Intravenous Q12H  . ticagrelor  90 mg Oral BID   Continuous Infusions: . sodium chloride     PRN Meds: sodium chloride, acetaminophen, albuterol, meclizine, nitroGLYCERIN, ondansetron (ZOFRAN) IV, sodium chloride flush   Vital Signs    Vitals:   01/03/17 1500 01/03/17 1600 01/03/17 2006 01/04/17 0609  BP: (!) 151/76 (!) 147/67 (!) 140/50 140/76  Pulse: 74 73 79 81  Resp: 20 15 19 18   Temp:   98.2 F (36.8 C) 97.8 F (36.6 C)  TempSrc:   Oral Oral  SpO2: 96% 98% 96% 96%  Weight:    160 lb 15 oz (73 kg)  Height:        Intake/Output Summary (Last 24 hours) at 01/04/17 0829 Last data filed at 01/04/17 4888  Gross per 24 hour  Intake             1200 ml  Output             2700 ml  Net            -1500 ml   Filed Weights   01/02/17 0005 01/03/17 0300 01/04/17 0609  Weight: 166 lb 9.6 oz (75.6 kg) 165 lb 6.4 oz (75 kg) 160 lb 15 oz (73 kg)    Telemetry    NSR - Personally Reviewed  ECG    NSR - Personally Reviewed  Physical Exam   GEN: No acute distress.   Neck: No JVD Cardiac: RRR, no murmurs, rubs, or gallops.  Respiratory: Clear to auscultation  bilaterally. GI: Soft, nontender, non-distended  MS: No edema; No deformity. Cath site normal Neuro:  Nonfocal  Psych: Normal affect   Labs    Chemistry Recent Labs Lab 01/02/17 0641 01/03/17 0413 01/04/17 0231  NA 142 141 140  K 3.3* 4.0 3.4*  CL 105 106 104  CO2 28 27 27   GLUCOSE 112* 119* 115*  BUN 13 11 9   CREATININE 0.74 0.75 0.99  CALCIUM 8.5* 8.8* 9.0  GFRNONAA >60 >60 57*  GFRAA >60 >60 >60  ANIONGAP 9 8 9      Hematology Recent Labs Lab 01/01/17 1900 01/02/17 0641 01/04/17 0231  WBC 10.4 10.2 11.4*  RBC 4.79 4.52 4.59  HGB 12.7 12.0 12.0  HCT 38.8 37.0 38.0  MCV 81.0 81.9 82.8  MCH 26.5 26.5 26.1  MCHC 32.7 32.4 31.6  RDW 14.5 14.7 14.8  PLT 292 239 280    Cardiac Enzymes Recent Labs Lab 01/01/17 2144 01/02/17 0144 01/02/17 0641 01/02/17 1241  TROPONINI 0.39* 0.47* 0.58* 1.43*    Recent Labs Lab 01/01/17 1910  TROPIPOC 0.16*     BNPNo results for input(s): BNP, PROBNP in the last 168 hours.   DDimer  Recent Labs Lab 01/01/17 2144  DDIMER <0.27     Radiology    No results found.  Cardiac Studies   Cath 01/03/17  Conclusions: 1. Significant 2-vessel coronary artery disease, including diffuse LAD disease with up to 99% stenosis in the mid portion and TIMI-2 flow, as well as 90% small ramus lesion and 40% proximal/mid LCx disease. 2. Moderately reduced LV contraction (LVEF 35-45%) with anterior and apical akinesis. 3. Moderately elevated left ventricular filling pressure. 4. Successful IVUS-guided PCI to the mid and distal LAD with placement of 2 overlapping Resolute Onyx drug-eluting stents with 0% residual stenosis and TIMI-3 flow.  Recommendations: 1. Dual antiplatelet therapy with aspirin and ticagrelor for at least 12 months, ideally longer. 2. Aggressive secondary prevention. 3. Medical therapy for small ramus intermedius and jailed diagonal branch, as PCI would be challenging given small vessel size. 4. Diuresis and  titration of evidence-based heart failure therapy, given moderately reduced LV contraction with elevated filling pressure.   Patient Profile     66 y.o. female with non-ST elevation myocardial infarction, ischemic cardio myopathy ejection fraction approximate 40% with anterior and apical akinesis with ECI to the mid LAD 2 separate long stents, DES  Assessment & Plan    Non-ST elevation myocardial infarction  - PCI to LAD 2  - Residual small caliber ramus lesion, medically managed.  - Continue dual antiplatelet therapy, high intensity statin, beta blocker  Ischemic cardiomyopathy/dilated cardio myopathy  - Beta blocker, we will add angiotensin receptor blocker, losartan 25 mg once a day  - EF approximate 40%  - We will reassess ejection fraction in approximately 3 months  - Adding Lasix 20 mg once a day. LVEDP was elevated. Mild dyspnea on exertion noted.  Obesity  - Encourage weight loss  Hyperlipidemia  - Statin  Right bundle branch block  - Chronic  Essential hypertension  - Adding losartan 25 mg. Lasix 20 mg.  Okay with discharge with close clinic follow-up in 7 days TOC with me or APP  Signed, Candee Furbish, MD  01/04/2017, 8:29 AM

## 2017-01-04 NOTE — Care Management Important Message (Signed)
Important Message  Patient Details  Name: Hannah Vasquez MRN: [id number] Date of Birth: July 22, 1951   Medicare Important Message Given:  Yes    Brinda Focht Montine Circle 01/04/2017, 11:58 AM

## 2017-01-05 ENCOUNTER — Telehealth (HOSPITAL_COMMUNITY): Payer: Self-pay

## 2017-01-05 ENCOUNTER — Telehealth: Payer: Self-pay | Admitting: *Deleted

## 2017-01-05 NOTE — Telephone Encounter (Signed)
Patient contacted regarding discharge from [facility] on 01/04/17.  Patient understands to follow up with provider Truitt Merle, NP on 5/30 at 11:30 am at [facility]. Patient understands discharge instructions? Yes Patient understands medications and regiment? Yes  Patient understands to bring all medications to this visit? Yes  I answered patient's medication questions to her satisfaction and the satisfaction of patient's daughter.

## 2017-01-05 NOTE — Telephone Encounter (Signed)
Lm requesting return call to complete TCM and confirm hosp f/u appt

## 2017-01-05 NOTE — Telephone Encounter (Signed)
S/w Levada Dy with [employer] to verify insurance benefits Copay $45, No Coinsurance or Deductible. Out of Pocket $3400.00, pt has met $440.00. Reference # [id number].... KJ

## 2017-01-06 ENCOUNTER — Encounter: Payer: Self-pay | Admitting: Family Medicine

## 2017-01-06 ENCOUNTER — Ambulatory Visit (INDEPENDENT_AMBULATORY_CARE_PROVIDER_SITE_OTHER): Payer: PPO | Admitting: Family Medicine

## 2017-01-06 VITALS — BP 140/90 | HR 86 | Temp 98.6°F | Wt 158.5 lb

## 2017-01-06 DIAGNOSIS — I214 Non-ST elevation (NSTEMI) myocardial infarction: Secondary | ICD-10-CM

## 2017-01-06 DIAGNOSIS — E876 Hypokalemia: Secondary | ICD-10-CM | POA: Diagnosis not present

## 2017-01-06 LAB — CBC WITH DIFFERENTIAL/PLATELET
BASOS PCT: 0.3 % (ref 0.0–3.0)
Basophils Absolute: 0 10*3/uL (ref 0.0–0.1)
EOS ABS: 0.2 10*3/uL (ref 0.0–0.7)
Eosinophils Relative: 2.4 % (ref 0.0–5.0)
HCT: 40.7 % (ref 36.0–46.0)
HEMOGLOBIN: 13.4 g/dL (ref 12.0–15.0)
Lymphocytes Relative: 28.9 % (ref 12.0–46.0)
Lymphs Abs: 2.7 10*3/uL (ref 0.7–4.0)
MCHC: 32.9 g/dL (ref 30.0–36.0)
MCV: 81 fl (ref 78.0–100.0)
MONO ABS: 0.6 10*3/uL (ref 0.1–1.0)
Monocytes Relative: 6.8 % (ref 3.0–12.0)
Neutro Abs: 5.7 10*3/uL (ref 1.4–7.7)
Neutrophils Relative %: 61.6 % (ref 43.0–77.0)
Platelets: 342 10*3/uL (ref 150.0–400.0)
RBC: 5.02 Mil/uL (ref 3.87–5.11)
RDW: 15.8 % — AB (ref 11.5–15.5)
WBC: 9.3 10*3/uL (ref 4.0–10.5)

## 2017-01-06 LAB — COMPREHENSIVE METABOLIC PANEL
ALBUMIN: 4.3 g/dL (ref 3.5–5.2)
ALK PHOS: 96 U/L (ref 39–117)
ALT: 41 U/L — AB (ref 0–35)
AST: 41 U/L — AB (ref 0–37)
BUN: 15 mg/dL (ref 6–23)
CO2: 27 mEq/L (ref 19–32)
CREATININE: 0.89 mg/dL (ref 0.40–1.20)
Calcium: 10 mg/dL (ref 8.4–10.5)
Chloride: 101 mEq/L (ref 96–112)
GFR: 66.99 mL/min (ref >60.00)
Glucose, Bld: 104 mg/dL — ABNORMAL HIGH (ref 70–99)
Potassium: 3.3 mEq/L — ABNORMAL LOW (ref 3.5–5.1)
SODIUM: 137 meq/L (ref 135–145)
TOTAL PROTEIN: 7.8 g/dL (ref 6.0–8.3)
Total Bilirubin: 0.8 mg/dL (ref 0.2–1.2)

## 2017-01-06 NOTE — Progress Notes (Signed)
Subjective:   Patient ID: Hannah Vasquez, female    DOB: Aug 09, 1951, 66 y.o.   MRN: [id number]  Hannah Vasquez is a pleasant 66 y.o. year old female who presents to clinic today with Hospitalization Follow-up  on 01/06/2017  HPI:  Admitted to [facility] 5/19- 01/04/17. Notes reviewed.   Presented to ER after having intermittent CP for two weeks that acutely worsened over the past day. Pain was at rest and with exertion.  Was not relieved by [facility] or fentanyl.  Potassium was 2.8 in ER and Troponin was 0.16.  Started on ASA and heparin gtt.  She was then taken to the cath lab and received 2 stents to her LAD.  Echocardiogram with ischemic cardiomyopathy and a reduced EF of 40-45%.   She was started on lipitor, brilinta, imdur, lasix/K, and losartan at discharge and continued toprol.    Given these new medications, her home HCTZ was held. She was instructed to weigh daily and to record daily blood pressure readings but she does not have a reliable home BP cuff yet.  She has appt to see cardiology on 01/12/17.  Chest pain has not reoccured the way that it felt prior to admission but does still have episodes of SOB. Current Outpatient Prescriptions on File Prior to Visit  Medication Sig Dispense Refill  . aspirin EC 81 MG tablet Take 81 mg by mouth every morning.     Marland Kitchen atorvastatin (LIPITOR) 80 MG tablet Take 1 tablet (80 mg total) by mouth daily at 6 PM. 30 tablet 3  . Calcium Carbonate-Vit D-Min 600-400 MG-UNIT TABS Take 1 tablet by mouth every morning.     . Cholecalciferol (VITAMIN D3) 1000 UNITS CAPS Take 1 capsule by mouth every morning.     Marland Kitchen doxycycline (VIBRA-TABS) 100 MG tablet Take 1 tablet (100 mg total) by mouth 2 (two) times daily. 14 tablet 0  . fluocinonide-emollient (LIDEX-E) 0.05 % cream Apply 1 application topically daily as needed (to legs for rash). 30 g 2  . furosemide (LASIX) 20 MG tablet Take 1 tablet (20 mg total) by mouth daily. 30 tablet 3  . isosorbide mononitrate  (IMDUR) 30 MG 24 hr tablet Take 0.5 tablets (15 mg total) by mouth daily. 30 tablet 3  . loratadine (CLARITIN) 10 MG tablet Take 1 tablet (10 mg total) by mouth every morning. 30 tablet 2  . losartan (COZAAR) 25 MG tablet Take 1 tablet (25 mg total) by mouth daily. 30 tablet 3  . LYRICA 75 MG capsule TAKE ONE CAPSULE BY MOUTH TWICE A DAY 60 capsule 0  . meclizine (ANTIVERT) 25 MG tablet Take 1 tablet (25 mg total) by mouth every 4 (four) hours as needed for dizziness. 60 tablet 0  . metoprolol succinate (TOPROL-XL) 50 MG 24 hr tablet Take 1 tablet (50 mg total) by mouth daily. Take with or immediately following a meal. 90 tablet 3  . Multiple Vitamin (MULTIVITAMIN) tablet Take 1 tablet by mouth every morning.     . nitroGLYCERIN (NITROSTAT) 0.4 MG SL tablet Place 1 tablet (0.4 mg total) under the tongue every 5 (five) minutes as needed for chest pain. 25 tablet 3  . nystatin (MYCOSTATIN) 100000 UNIT/ML suspension Take 5 mLs (500,000 Units total) by mouth 4 (four) times daily. 100 mL 0  . Omega-3 Fatty Acids (FISH OIL) 1200 MG CAPS Take 1 capsule by mouth every morning.     Marland Kitchen omeprazole (PRILOSEC) 40 MG capsule     . potassium chloride  SA (K-DUR,KLOR-CON) 10 MEQ tablet Take 1 tablet (10 mEq total) by mouth daily. 30 tablet 3  . PROAIR HFA 108 (90 Base) MCG/ACT inhaler INHALE 2 PUFFS INTO THE LUNGS EVERY 4 HOURS AS NEEDED (Patient taking differently: INHALE 2 PUFFS INTO THE LUNGS EVERY 4 HOURS AS NEEDED FOR SOB AND WHEEZING) 8.5 g 5  . promethazine-dextromethorphan (PROMETHAZINE-DM) 6.25-15 MG/5ML syrup Take 5 mLs by mouth 4 (four) times daily as needed. 118 mL 0  . ticagrelor (BRILINTA) 90 MG TABS tablet Take 1 tablet (90 mg total) by mouth 2 (two) times daily. 60 tablet 0   No current facility-administered medications on file prior to visit.     Allergies  Allergen Reactions  . Codeine     REACTION: nausea and vomiting  . Penicillins     REACTION: rash    Past Medical History:  Diagnosis  Date  . Allergy   . Anxiety   . CAD (coronary artery disease)   . Cataract   . Hemorrhoids   . Hypertension   . Mitral valve prolapse   . Osteoporosis   . Status post dilation of esophageal narrowing     Past Surgical History:  Procedure Laterality Date  . BASAL CELL CARCINOMA EXCISION  02/05/2016   Dr. Sarajane Jews, [facility] D[facility]at[facility]  . BLADDER SURGERY  11/15/1995   bladder neck suspension, urinary incontinence  . CATARACT EXTRACTION Left 04/30/2016   Dr. Tommy Rainwater, [place] N/A 01/03/2017   Procedure: Coronary Stent Intervention;  Surgeon: Nelva Bush, MD;  Location: [place] [facility];  Service: Cardiovascular;  Laterality: N/A;  . DENTAL SURGERY  06/20/2016   Tooth Implant; Dr. Kalman Shan  . INTRAVASCULAR ULTRASOUND/IVUS N/A 01/03/2017   Procedure: Intravascular Ultrasound/IVUS;  Surgeon: Nelva Bush, MD;  Location: [place] [facility];  Service: Cardiovascular;  Laterality: N/A;  . KNEE SURGERY  2003   torn meniscus  . LEFT HEART CATH AND CORONARY ANGIOGRAPHY N/A 01/03/2017   Procedure: Left Heart Cath and Coronary Angiography;  Surgeon: Nelva Bush, MD;  Location: [place] [facility];  Service: Cardiovascular;  Laterality: N/A;  . MOHS SURGERY Right 2017   right side of face  . SHOULDER SURGERY Right 2005    Family History  Problem Relation Age of Onset  . Mitral valve prolapse Mother   . Hypertension Father     Social History   Social History  . Marital status: Divorced    Spouse name: N/A  . Number of children: 2  . Years of education: N/A   Occupational History  . Medical office---front desk     Retired   Social History Main Topics  . Smoking status: Never Smoker  . Smokeless tobacco: Never Used  . Alcohol use Not on file  . Drug use: Unknown  . Sexual activity: Yes   Other Topics Concern  . Not on file   Social History Narrative   Has a living will.   HPOA- daughter.   She would desire CPR.  Would  desire life support if necessary and not futile.         The PMH, PSH, Social History, Family History, Medications, and allergies have been reviewed in [facility], and have been updated if relevant.   Review of Systems  Constitutional: Negative.   HENT: Negative.   Respiratory: Positive for shortness of breath. Negative for chest tightness.   Cardiovascular: Negative.   Gastrointestinal: Negative.   Endocrine: Negative.   Genitourinary: Negative.   Musculoskeletal: Negative.  Allergic/Immunologic: Negative.   Neurological: Negative.   Hematological: Negative.   Psychiatric/Behavioral: Negative.   All other systems reviewed and are negative.      Objective:    BP 140/90   Pulse 86   Wt 158 lb 8 oz (71.9 kg)   SpO2 98%   BMI 28.99 kg/m    Physical Exam  Constitutional: She is oriented to person, place, and time. She appears well-developed and well-nourished. No distress.  HENT:  Head: Normocephalic and atraumatic.  Eyes: Conjunctivae are normal.  Cardiovascular: Normal rate and regular rhythm.   Pulmonary/Chest: Effort normal and breath sounds normal.  Musculoskeletal: Normal range of motion. She exhibits no edema.  Neurological: She is alert and oriented to person, place, and time. No cranial nerve deficit. Coordination normal.  Skin: Skin is warm and dry. She is not diaphoretic.  Psychiatric: She has a normal mood and affect. Her behavior is normal. Judgment and thought content normal.  Nursing note and vitals reviewed.         Assessment & Plan:   NSTEMI (non-ST elevated myocardial infarction) ([place]) - Plan: Comprehensive metabolic panel  Hypokalemia - Plan: Comprehensive metabolic panel No Follow-up on file.

## 2017-01-06 NOTE — Assessment & Plan Note (Signed)
Continue potassium supplementation. Check potassium today.

## 2017-01-06 NOTE — Assessment & Plan Note (Signed)
S/p cath, stent placement. Continue current rxs. Keep appt with cardiology scheduled for next week. The patient indicates understanding of these issues and agrees with the plan.

## 2017-01-06 NOTE — Patient Instructions (Signed)
Great to see you.  I will call you with your results.  Please keep your appointment with cardiology.

## 2017-01-11 ENCOUNTER — Encounter: Payer: Self-pay | Admitting: Nurse Practitioner

## 2017-01-12 ENCOUNTER — Encounter: Payer: Self-pay | Admitting: Nurse Practitioner

## 2017-01-12 ENCOUNTER — Telehealth: Payer: Self-pay | Admitting: *Deleted

## 2017-01-12 ENCOUNTER — Encounter (INDEPENDENT_AMBULATORY_CARE_PROVIDER_SITE_OTHER): Payer: Self-pay

## 2017-01-12 ENCOUNTER — Ambulatory Visit (INDEPENDENT_AMBULATORY_CARE_PROVIDER_SITE_OTHER): Payer: PPO | Admitting: Nurse Practitioner

## 2017-01-12 VITALS — BP 128/80 | HR 73 | Ht 62.0 in | Wt 159.8 lb

## 2017-01-12 DIAGNOSIS — R0602 Shortness of breath: Secondary | ICD-10-CM

## 2017-01-12 DIAGNOSIS — I2109 ST elevation (STEMI) myocardial infarction involving other coronary artery of anterior wall: Secondary | ICD-10-CM | POA: Diagnosis not present

## 2017-01-12 MED ORDER — LOSARTAN POTASSIUM 50 MG PO TABS: 50.0000 mg | ORAL_TABLET | Freq: Every day | ORAL | 3 refills | Status: DC

## 2017-01-12 MED ORDER — PRASUGREL HCL 10 MG PO TABS: 10.0000 mg | ORAL_TABLET | Freq: Every day | ORAL | 3 refills | Status: DC

## 2017-01-12 NOTE — Progress Notes (Signed)
CARDIOLOGY OFFICE NOTE  Date:  01/12/2017    Hannah Vasquez Date of Birth: 1951-07-18 Medical Record #[id number]  PCP:  Lucille Passy, MD  Cardiologist:  [facility]   Chief Complaint  Patient presents with  . Coronary Artery Disease    TOC visit - seen for Dr. Marlou Porch    History of Present Illness: Hannah Vasquez is a 66 y.o. female who presents today for a post hospital/TOV visit. Seen for Dr. Marlou Porch.   She has a history of HTN, MVP, a chronic RBBB.   She presented to the [place] ER on 01/02/17 with substernal CP. She had been experiencing intermittent CP for the prior two weeks, described as a "pressure" over the center of her chest, and occurring both at rest and with exertion. On the day of presentation she was at rest when the CP returned but did not resolve, prompting presentation to the OSH ED. The discomfort then waxed and waned for several hours before fully resolving, but was not appreciably improved after receiving either SLNG or fentanyl. Initial w/u was remarkable for a hypokalemia with K=2.8 and a troponin of 0.16. Given this mildly elevated troponin and risk factors of age and HTN she was started on a heparin gtt and given a full dose ASA, with subsequent request for admission to [facility] for further management. On arrival to [facility] she was CP free and had no other acute complaints.   She was taken to the cath lab and received 2 stents to her LAD.  Echocardiogram showed ischemic cardiomyopathy with a reduced EF at 40-45%. She was started on lipitor, brilinta, imdur, lasix/K, and losartan at discharge and continued on toprol. Spironolactone was not yet started. Given these new medications, her home HCTZ was held.    Comes in today. Here with her daughter. Lots of issues. She has had multiple issues recently - shingles, pneumonia, etc. And now the heart attack. Can't get her strength back. Feels short of breath perhaps - very hard for her to describe - notes its hard to get  a deep breath - worse over the past few days. Taking Brilinta with Coke - no help. No actual chest pressure (which was her presenting symptom) but did have some of this shortness of breath prior to going to the hospital. No swelling. No bloating. Can lie flat in the bed at night. Wakes up however due to feeling gaspy. Weight has been stable. She is trying to work on her diet. Wants to lose weight. Interested in cardiac rehab but quite fatigued right now.   Past Medical History:  Diagnosis Date  . Allergy   . Anxiety   . CAD (coronary artery disease)   . Cataract   . Hemorrhoids   . Hypertension   . Mitral valve prolapse   . Osteoporosis   . Status post dilation of esophageal narrowing     Past Surgical History:  Procedure Laterality Date  . BASAL CELL CARCINOMA EXCISION  02/05/2016   Dr. Sarajane Jews, [facility]ermat[facility]  . BLADDER SURGERY  11/15/1995   bladder neck suspension, urinary incontinence  . CATARACT EXTRACTION Left 04/30/2016   Dr. Tommy Rainwater, [place] N/A 01/03/2017   Procedure: Coronary Stent Intervention;  Surgeon: Nelva Bush, MD;  Location: [place] [facility];  Service: Cardiovascular;  Laterality: N/A;  . DENTAL SURGERY  06/20/2016   Tooth Implant; Dr. Kalman Shan  . INTRAVASCULAR ULTRASOUND/IVUS N/A 01/03/2017   Procedure: Intravascular Ultrasound/IVUS;  Surgeon: Nelva Bush, MD;  Location: [place] [facility];  Service: Cardiovascular;  Laterality: N/A;  . KNEE SURGERY  2003   torn meniscus  . LEFT HEART CATH AND CORONARY ANGIOGRAPHY N/A 01/03/2017   Procedure: Left Heart Cath and Coronary Angiography;  Surgeon: Nelva Bush, MD;  Location: [place] [facility];  Service: Cardiovascular;  Laterality: N/A;  . MOHS SURGERY Right 2017   right side of face  . SHOULDER SURGERY Right 2005     Medications: Current Outpatient Prescriptions  Medication Sig Dispense Refill  . aspirin EC 81 MG tablet Take 81 mg by mouth every morning.      Marland Kitchen atorvastatin (LIPITOR) 80 MG tablet Take 1 tablet (80 mg total) by mouth daily at 6 PM. 30 tablet 3  . Cholecalciferol (VITAMIN D3) 1000 UNITS CAPS Take 1 capsule by mouth every morning.     . furosemide (LASIX) 20 MG tablet Take 1 tablet (20 mg total) by mouth daily. 30 tablet 3  . isosorbide mononitrate (IMDUR) 30 MG 24 hr tablet Take 0.5 tablets (15 mg total) by mouth daily. 30 tablet 3  . loratadine (CLARITIN) 10 MG tablet Take 1 tablet (10 mg total) by mouth every morning. 30 tablet 2  . LYRICA 75 MG capsule TAKE ONE CAPSULE BY MOUTH TWICE A DAY 60 capsule 0  . meclizine (ANTIVERT) 25 MG tablet Take 1 tablet (25 mg total) by mouth every 4 (four) hours as needed for dizziness. 60 tablet 0  . metoprolol succinate (TOPROL-XL) 50 MG 24 hr tablet Take 1 tablet (50 mg total) by mouth daily. Take with or immediately following a meal. 90 tablet 3  . Multiple Vitamin (MULTIVITAMIN) tablet Take 1 tablet by mouth every morning.     . nitroGLYCERIN (NITROSTAT) 0.4 MG SL tablet Place 1 tablet (0.4 mg total) under the tongue every 5 (five) minutes as needed for chest pain. 25 tablet 3  . Omega-3 Fatty Acids (FISH OIL) 1200 MG CAPS Take 1 capsule by mouth every morning.     Marland Kitchen omeprazole (PRILOSEC) 40 MG capsule     . potassium chloride SA (K-DUR,KLOR-CON) 10 MEQ tablet Take 1 tablet (10 mEq total) by mouth daily. 30 tablet 3  . PROAIR HFA 108 (90 Base) MCG/ACT inhaler INHALE 2 PUFFS INTO THE LUNGS EVERY 4 HOURS AS NEEDED (Patient taking differently: INHALE 2 PUFFS INTO THE LUNGS EVERY 4 HOURS AS NEEDED FOR SOB AND WHEEZING) 8.5 g 5  . losartan (COZAAR) 50 MG tablet Take 1 tablet (50 mg total) by mouth daily. 90 tablet 3  . prasugrel (EFFIENT) 10 MG TABS tablet Take 1 tablet (10 mg total) by mouth daily. Take 6 pills today - total of 60 mg and then one pill daily starting tomorrow 96 tablet 3   No current facility-administered medications for this visit.     Allergies: Allergies  Allergen Reactions   . Codeine     REACTION: nausea and vomiting  . Penicillins     REACTION: rash    Social History: The patient  reports that she has never smoked. She has never used smokeless tobacco. She reports that she does not use drugs.   Family History: The patient's family history includes Hypertension in her father; Mitral valve prolapse in her mother.   Review of Systems: Please see the history of present illness.   Otherwise, the review of systems is positive for none.   All other systems are reviewed and negative.   Physical Exam: VS:  BP 130/62 (BP Location: Left Arm, Patient Position: Sitting, Cuff Size: Normal)   Pulse 73   Ht 5' 2"  (1.575 m)   Wt 159 lb 12.8 oz (72.5 kg)   BMI 29.23 kg/m  .  BMI Body mass index is 29.23 kg/m.  Wt Readings from Last 3 Encounters:  01/12/17 159 lb 12.8 oz (72.5 kg)  01/06/17 158 lb 8 oz (71.9 kg)  01/04/17 160 lb 15 oz (73 kg)    General: Pleasant. Well developed, well nourished and in no acute distress.   HEENT: Normal.  Neck: Supple, no JVD, carotid bruits, or masses noted.  Cardiac: Regular rate and rhythm. No murmurs, rubs, or gallops. No edema.  Respiratory:  Lungs are clear to auscultation bilaterally with normal work of breathing.  GI: Soft and nontender.  MS: No deformity or atrophy. Gait and ROM intact.  Skin: Warm and dry. Color is normal.  Neuro:  Strength and sensation are intact and no gross focal deficits noted.  Psych: Alert, appropriate and with normal affect.   LABORATORY DATA:  EKG:  EKG is ordered today. This shows NSR with RBBB and inferolateral T wave changes. T wave changes actually look better. Reviewed with Dr. Irish Lack today.   Lab Results  Component Value Date   WBC 9.3 01/06/2017   HGB 13.4 01/06/2017   HCT 40.7 01/06/2017   PLT 342.0 01/06/2017   GLUCOSE 104 (H) 01/06/2017   CHOL 144 01/02/2017   TRIG 193 (H) 01/02/2017   HDL 23 (L) 01/02/2017   LDLDIRECT 151.0 09/09/2011   LDLCALC 82 01/02/2017   ALT  41 (H) 01/06/2017   AST 41 (H) 01/06/2017   NA 137 01/06/2017   K 3.3 (L) 01/06/2017   CL 101 01/06/2017   CREATININE 0.89 01/06/2017   BUN 15 01/06/2017   CO2 27 01/06/2017   TSH 2.95 09/20/2016   INR 1.12 01/02/2017   HGBA1C 6.1 (H) 01/02/2017    BNP (last 3 results) No results for input(s): BNP in the last 8760 hours.  ProBNP (last 3 results) No results for input(s): PROBNP in the last 8760 hours.   Other Studies Reviewed Today: Cath 01/03/17 Conclusions: 1. Significant 2-vessel coronary artery disease, including diffuse LAD disease with up to 99% stenosis in the mid portion and TIMI-2 flow, as well as 90% small ramus lesion and 40% proximal/mid LCx disease. 2. Moderately reduced LV contraction (LVEF 35-45%) with anterior and apical akinesis. 3. Moderately elevated left ventricular filling pressure. 4. Successful IVUS-guided PCI to the mid and distal LAD with placement of 2 overlapping Resolute Onyx drug-eluting stents with 0% residual stenosis and TIMI-3 flow.  Recommendations: 1. Dual antiplatelet therapy with aspirin and ticagrelor for at least 12 months, ideally longer. 2. Aggressive secondary prevention. 3. Medical therapy for small ramus intermedius and jailed diagonal branch, as PCI would be challenging given small vessel size. 4. Diuresis and titration of evidence-based heart failure therapy, given moderately reduced LV contraction with elevated filling pressure.    Echocardiogram 01/03/17:  Study Conclusions - Left ventricle: The cavity size was normal. Wall thickness was normal. Systolic function was mildly to moderately reduced. The estimated ejection fraction was in the range of 40% to 45%. Hypokinesis of the mid-apicalanteroseptal, anterior, and apical myocardium. Doppler parameters are consistent with abnormal left ventricular relaxation (grade 1 diastolic dysfunction). Doppler parameters are consistent with elevated mean left atrial  filling pressure. - Pericardium, extracardiac: A trivial pericardial effusion was identified.    Assessment/Plan: 1. CAD - recent cath/PCI  with stent x 2 to the LAD - on DAPT - no actual chest pain/pressure but shortness of breath persisting - changing to Effient today - will reload with 60 mg and then 10 mg a day. Lab today. Hold on cardiac rehab for now. I would hope her symptoms will improve rather quickly with med changes. Extra lasix today and tomorrow. Hold on referral to cardiac rehab for now - would like to get her symptoms improved.   2. Newly diagnosed ICM - lots of BP to work with - would hold on adding Aldactone until we see her lab - increasing ARB today. Reviewed daily weights, restrict salt, etc.  Plan to repeat her echo in 3 months.   3. HTN - ARB increased today.   4. HLD - on high dose statin. Needs follow up fasting labs in 4 to 6 weeks.   5. Obesity - discussed at length. She seems motivated to make changes.   Current medicines are reviewed with the patient today.  The patient does not have concerns regarding medicines other than what has been noted above.  The following changes have been made:  See above.  Labs/ tests ordered today include:    Orders Placed This Encounter  Procedures  . Basic metabolic panel  . CBC  . Pro b natriuretic peptide (BNP)  . EKG 12-Lead     Disposition:   FU with me in about 10 days. Will get her to see Dr. Marlou Porch in 4 weeks with fasting labs.   Patient is agreeable to this plan and will call if any problems develop in the interim.   SignedTruitt Merle, NP  01/12/2017 12:34 PM  [place] [address] [place], [place]  [address] Phone: [phone] Fax: [phone]

## 2017-01-12 NOTE — Patient Instructions (Addendum)
We will be checking the following labs today - BMET, CBC, BNP   Medication Instructions:    Continue with your current medicines. BUT  Increase the Losartan to 50 mg a day - you can take 2 of your 25 mg to use up - the RX for the 50 mg is at your drug store  Extra dose of Lasix today and tomorrow  STOP Brilinta  START Effient - this is at the drug store - you will take 6 pills today (52m) and then one (186m pill daily thereafter      Testing/Procedures To Be Arranged:  N/A  Follow-Up:   See me in about 10 days  Dr. SkMarlou Porchn 4 weeks with fasting labs.     Other Special Instructions:   Weigh daily - If your weight goes up by 2 or more pounds overnight then take an extra lasix  Limit salt to less than 2000 mg per day Here are my tips to lose weight:  1. Drink only water. You do not need milk, juice, tea, soda or diet soda.  2. Do not eat anything "white". This includes white bread, potatoes, rice or mayo  3. Stay away from fried foods and sweets  4. Your portion should be the size of the palm of your hand.  5. Know what your weaknesses are and avoid.   6. Find an exercise you like and do it every day for 45 to 60 minutes.         DASH Eating Plan DASH stands for "Dietary Approaches to Stop Hypertension." The DASH eating plan is a healthy eating plan that has been shown to reduce high blood pressure (hypertension). It may also reduce your risk for type 2 diabetes, heart disease, and stroke. The DASH eating plan may also help with weight loss. What are tips for following this plan? General guidelines   Avoid eating more than 2,300 mg (milligrams) of salt (sodium) a day. If you have hypertension, you may need to reduce your sodium intake to 1,500 mg a day.  Limit alcohol intake to no more than 1 drink a day for nonpregnant women and 2 drinks a day for men. One drink equals 12 oz of beer, 5 oz of wine, or 1 oz of hard liquor.  Work with your health care provider  to maintain a healthy body weight or to lose weight. Ask what an ideal weight is for you.  Get at least 30 minutes of exercise that causes your heart to beat faster (aerobic exercise) most days of the week. Activities may include walking, swimming, or biking.  Work with your health care provider or diet and nutrition specialist (dietitian) to adjust your eating plan to your individual calorie needs. Reading food labels   Check food labels for the amount of sodium per serving. Choose foods with less than 5 percent of the Daily Value of sodium. Generally, foods with less than 300 mg of sodium per serving fit into this eating plan.  To find whole grains, look for the word "whole" as the first word in the ingredient list. Shopping   Buy products labeled as "low-sodium" or "no salt added."  Buy fresh foods. Avoid canned foods and premade or frozen meals. Cooking   Avoid adding salt when cooking. Use salt-free seasonings or herbs instead of table salt or sea salt. Check with your health care provider or pharmacist before using salt substitutes.  Do not fry foods. Cook foods using healthy methods such as baking,  boiling, grilling, and broiling instead.  Cook with heart-healthy oils, such as olive, canola, soybean, or sunflower oil. Meal planning    Eat a balanced diet that includes:  5 or more servings of fruits and vegetables each day. At each meal, try to fill half of your plate with fruits and vegetables.  Up to 6-8 servings of whole grains each day.  Less than 6 oz of lean meat, poultry, or fish each day. A 3-oz serving of meat is about the same size as a deck of cards. One egg equals 1 oz.  2 servings of low-fat dairy each day.  A serving of nuts, seeds, or beans 5 times each week.  Heart-healthy fats. Healthy fats called Omega-3 fatty acids are found in foods such as flaxseeds and coldwater fish, like sardines, salmon, and mackerel.  Limit how much you eat of the  following:  Canned or prepackaged foods.  Food that is high in trans fat, such as fried foods.  Food that is high in saturated fat, such as fatty meat.  Sweets, desserts, sugary drinks, and other foods with added sugar.  Full-fat dairy products.  Do not salt foods before eating.  Try to eat at least 2 vegetarian meals each week.  Eat more home-cooked food and less restaurant, buffet, and fast food.  When eating at a restaurant, ask that your food be prepared with less salt or no salt, if possible. What foods are recommended? The items listed may not be a complete list. Talk with your dietitian about what dietary choices are best for you. Grains  Whole-grain or whole-wheat bread. Whole-grain or whole-wheat pasta. Brown rice. [place]ena Morrow. Bulgur. Whole-grain and low-sodium cereals. Pita bread. Low-fat, low-sodium crackers. Whole-wheat flour tortillas. Vegetables  Fresh or frozen vegetables (raw, steamed, roasted, or grilled). Low-sodium or reduced-sodium tomato and vegetable juice. Low-sodium or reduced-sodium tomato sauce and tomato paste. Low-sodium or reduced-sodium canned vegetables. Fruits  All fresh, dried, or frozen fruit. Canned fruit in natural juice (without added sugar). Meat and other protein foods  Skinless chicken or [place]. Ground chicken or [place]. Pork with fat trimmed off. Fish and seafood. Egg whites. Dried beans, peas, or lentils. Unsalted nuts, nut butters, and seeds. Unsalted canned beans. Lean cuts of beef with fat trimmed off. Low-sodium, lean deli meat. Dairy  Low-fat (1%) or fat-free (skim) milk. Fat-free, low-fat, or reduced-fat cheeses. Nonfat, low-sodium ricotta or cottage cheese. Low-fat or nonfat yogurt. Low-fat, low-sodium cheese. Fats and oils  Soft margarine without trans fats. Vegetable oil. Low-fat, reduced-fat, or light mayonnaise and salad dressings (reduced-sodium). Canola, safflower, olive, soybean, and sunflower oils. Avocado. Seasoning and  other foods  Herbs. Spices. Seasoning mixes without salt. Unsalted popcorn and pretzels. Fat-free sweets. What foods are not recommended? The items listed may not be a complete list. Talk with your dietitian about what dietary choices are best for you. Grains  Baked goods made with fat, such as croissants, muffins, or some breads. Dry pasta or rice meal packs. Vegetables  Creamed or fried vegetables. Vegetables in a cheese sauce. Regular canned vegetables (not low-sodium or reduced-sodium). Regular canned tomato sauce and paste (not low-sodium or reduced-sodium). Regular tomato and vegetable juice (not low-sodium or reduced-sodium). Angie Fava. Olives. Fruits  Canned fruit in a light or heavy syrup. Fried fruit. Fruit in cream or butter sauce. Meat and other protein foods  Fatty cuts of meat. Ribs. Fried meat. Berniece Salines. Sausage. Bologna and other processed lunch meats. Salami. Fatback. Hotdogs. Bratwurst. Salted nuts and seeds. Canned beans  with added salt. Canned or smoked fish. Whole eggs or egg yolks. Chicken or [place] with skin. Dairy  Whole or 2% milk, cream, and half-and-half. Whole or full-fat cream cheese. Whole-fat or sweetened yogurt. Full-fat cheese. Nondairy creamers. Whipped toppings. Processed cheese and cheese spreads. Fats and oils  Butter. Stick margarine. Lard. Shortening. Ghee. Bacon fat. Tropical oils, such as coconut, palm kernel, or palm oil. Seasoning and other foods  Salted popcorn and pretzels. Onion salt, garlic salt, seasoned salt, table salt, and sea salt. Worcestershire sauce. Tartar sauce. Barbecue sauce. Teriyaki sauce. Soy sauce, including reduced-sodium. Steak sauce. Canned and packaged gravies. Fish sauce. Oyster sauce. Cocktail sauce. Horseradish that you find on the shelf. Ketchup. Mustard. Meat flavorings and tenderizers. Bouillon cubes. Hot sauce and Tabasco sauce. Premade or packaged marinades. Premade or packaged taco seasonings. Relishes. Regular salad  dressings. Where to find more information:  National Heart, Lung, and [place]: https://wilson-eaton.com/  American Heart Association: www.heart.org Summary  The DASH eating plan is a healthy eating plan that has been shown to reduce high blood pressure (hypertension). It may also reduce your risk for type 2 diabetes, heart disease, and stroke.  With the DASH eating plan, you should limit salt (sodium) intake to 2,300 mg a day. If you have hypertension, you may need to reduce your sodium intake to 1,500 mg a day.  When on the DASH eating plan, aim to eat more fresh fruits and vegetables, whole grains, lean proteins, low-fat dairy, and heart-healthy fats.  Work with your health care provider or diet and nutrition specialist (dietitian) to adjust your eating plan to your individual calorie needs. This information is not intended to replace advice given to you by your health care provider. Make sure you discuss any questions you have with your health care provider. Document Released: 07/22/2011 Document Revised: 07/26/2016 Document Reviewed: 07/26/2016 Elsevier Interactive Patient Education  2017 [employer].     If you need a refill on your cardiac medications before your next appointment, please call your pharmacy.   Call the [place] office at [phone] if you have any questions, problems or concerns.

## 2017-01-12 NOTE — Telephone Encounter (Signed)
Pt's daughter per [facility]) needed clarification on pt's diuretic. Clarification given. Sated appreciation.

## 2017-01-13 ENCOUNTER — Telehealth: Payer: Self-pay | Admitting: Nurse Practitioner

## 2017-01-13 LAB — CBC
Hematocrit: 38.3 % (ref 34.0–46.6)
Hemoglobin: 12.8 g/dL (ref 11.1–15.9)
MCH: 26.8 pg (ref 26.6–33.0)
MCHC: 33.4 g/dL (ref 31.5–35.7)
MCV: 80 fL (ref 79–97)
Platelets: 447 10*3/uL — ABNORMAL HIGH (ref 150–379)
RBC: 4.77 x10E6/uL (ref 3.77–5.28)
RDW: 15.7 % — ABNORMAL HIGH (ref 12.3–15.4)
WBC: 11.5 10*3/uL — ABNORMAL HIGH (ref 3.4–10.8)

## 2017-01-13 LAB — BASIC METABOLIC PANEL
BUN/Creatinine Ratio: 17 (ref 12–28)
BUN: 14 mg/dL (ref 8–27)
CO2: 25 mmol/L (ref 18–29)
Calcium: 9.8 mg/dL (ref 8.7–10.3)
Chloride: 102 mmol/L (ref 96–106)
Creatinine, Ser: 0.83 mg/dL (ref 0.57–1.00)
GFR calc Af Amer: 84 mL/min/{1.73_m2} (ref >59)
GFR calc non Af Amer: 73 mL/min/{1.73_m2} (ref >59)
Glucose: 96 mg/dL (ref 65–99)
Potassium: 3.5 mmol/L (ref 3.5–5.2)
Sodium: 143 mmol/L (ref 134–144)

## 2017-01-13 LAB — PRO B NATRIURETIC PEPTIDE: NT-Pro BNP: 417 pg/mL — ABNORMAL HIGH (ref 0–301)

## 2017-01-13 NOTE — Telephone Encounter (Signed)
New message    Pt is returning call to [facility].

## 2017-01-13 NOTE — Telephone Encounter (Signed)
Went over pt's lab results.

## 2017-01-14 NOTE — Telephone Encounter (Signed)
Follow Up Call   Mrs. Rhodus is calling to find out if she can go to her hair appointment to get a wash, color and get her nails and feet done . Wants to know if its too soon to get those things done . Also she has someone that will drive her . Please call .Marland Kitchen    Thanks

## 2017-01-14 NOTE — Telephone Encounter (Signed)
S/w pt is aware pt can do whatever is tolerable. Driving restrictions were not talked about at ov, pt can drive if feels comfortable. Pt is appreciative of the phone call.

## 2017-01-18 ENCOUNTER — Encounter: Payer: Self-pay | Admitting: Cardiology

## 2017-01-24 ENCOUNTER — Other Ambulatory Visit: Payer: Self-pay | Admitting: Family Medicine

## 2017-01-24 ENCOUNTER — Encounter: Payer: Self-pay | Admitting: Nurse Practitioner

## 2017-01-24 ENCOUNTER — Ambulatory Visit (INDEPENDENT_AMBULATORY_CARE_PROVIDER_SITE_OTHER): Payer: PPO | Admitting: Nurse Practitioner

## 2017-01-24 VITALS — BP 128/80 | HR 78 | Ht 62.0 in | Wt 156.1 lb

## 2017-01-24 DIAGNOSIS — I5022 Chronic systolic (congestive) heart failure: Secondary | ICD-10-CM | POA: Diagnosis not present

## 2017-01-24 DIAGNOSIS — I2109 ST elevation (STEMI) myocardial infarction involving other coronary artery of anterior wall: Secondary | ICD-10-CM | POA: Diagnosis not present

## 2017-01-24 DIAGNOSIS — I10 Essential (primary) hypertension: Secondary | ICD-10-CM

## 2017-01-24 DIAGNOSIS — I259 Chronic ischemic heart disease, unspecified: Secondary | ICD-10-CM

## 2017-01-24 MED ORDER — ISOSORBIDE MONONITRATE ER 30 MG PO TB24: 30.0000 mg | ORAL_TABLET | Freq: Every day | ORAL | 3 refills | Status: DC

## 2017-01-24 NOTE — Progress Notes (Addendum)
CARDIOLOGY OFFICE NOTE  Date:  01/24/2017    Hannah Vasquez Date of Birth: 10-Nov-1950 Medical Record #[id number]  PCP:  Lucille Passy, MD  Cardiologist:  Marisa Cyphers  Chief Complaint  Patient presents with  . Coronary Artery Disease    Follow up visit - seen for Dr. Marlou Porch    History of Present Illness: Hannah Vasquez is a 66 y.o. female who presents today for a follow up visit. Seen for Dr. Marlou Porch.   She has a history of HTN, MVP, a chronic RBBB.   She presented to the [place] ER on 01/02/17 with substernal CP. She had been experiencing intermittent CP for the prior two weeks, described as a "pressure" over the center of her chest, and occurring both at rest and with exertion. On the day of presentation she was at rest when the CP returned but did not resolve, prompting presentation to the OSH ED. The discomfort then waxed and waned for several hours before fully resolving, but was not appreciably improved after receiving either SLNG or fentanyl. Initial w/u was remarkable for a hypokalemia with K=2.8 and a troponin of 0.16. Given this mildly elevated troponin and risk factors of age and HTN she was started on a heparin gtt and given a full dose ASA, with subsequent request for admission to [facility] for further management. On arrival to [facility] was CP free and hadno other acute complaints.   She was taken to the cath lab and received 2 stents to her LAD.  Echocardiogram showed ischemic cardiomyopathy with a reduced EF at 40-45%. She was started on lipitor, brilinta, imdur, lasix/K, and losartan at discharge and continued on toprol. Spironolactone was not yet started. Given these new medications, her home HCTZ was held.    I saw her back for her TOC visit last month - lots of issues. Short of breath. Changed her Brilinta to Efffient. Increased her diuretics for a few days.   Comes in today. Here with multiple family members today. Improved "some". Still with lots of  vague symptoms. She tells me that she has more energy "sometimes". No chest pain or pressure and her breathing is better. But still - sometimes does not feel well. She is not really exercising. She is trying to be more active in the house and that tires her. Little dizzy at times. Little headache. Some indigestion - no longer on her PPI - ok to restart by me. She has not started driving. Admits that she is quite fearful of a relapse.   Past Medical History:  Diagnosis Date  . Allergy   . Anxiety   . CAD (coronary artery disease)   . Cataract   . Hemorrhoids   . Hypertension   . Mitral valve prolapse   . Osteoporosis   . Status post dilation of esophageal narrowing     Past Surgical History:  Procedure Laterality Date  . BASAL CELL CARCINOMA EXCISION  02/05/2016   Dr. Sarajane Jews, [facility]  . BLADDER SURGERY  11/15/1995   bladder neck suspension, urinary incontinence  . CATARACT EXTRACTION Left 04/30/2016   Dr. Tommy Rainwater, [place] N/A 01/03/2017   Procedure: Coronary Stent Intervention;  Surgeon: Nelva Bush, MD;  Location: [place] [facility];  Service: Cardiovascular;  Laterality: N/A;  . DENTAL SURGERY  06/20/2016   Tooth Implant; Dr. Kalman Shan  . INTRAVASCULAR ULTRASOUND/IVUS N/A 01/03/2017   Procedure: Intravascular Ultrasound/IVUS;  Surgeon: Nelva Bush, MD;  Location: [place] [facility];  Service: Cardiovascular;  Laterality: N/A;  . KNEE SURGERY  2003   torn meniscus  . LEFT HEART CATH AND CORONARY ANGIOGRAPHY N/A 01/03/2017   Procedure: Left Heart Cath and Coronary Angiography;  Surgeon: Nelva Bush, MD;  Location: [place] [facility];  Service: Cardiovascular;  Laterality: N/A;  . MOHS SURGERY Right 2017   right side of face  . SHOULDER SURGERY Right 2005     Medications: Current Outpatient Prescriptions  Medication Sig Dispense Refill  . aspirin EC 81 MG tablet Take 81 mg by mouth every morning.     Marland Kitchen atorvastatin  (LIPITOR) 80 MG tablet Take 1 tablet (80 mg total) by mouth daily at 6 PM. 30 tablet 3  . Cholecalciferol (VITAMIN D3) 1000 UNITS CAPS Take 1 capsule by mouth every morning.     . furosemide (LASIX) 20 MG tablet Take 1 tablet (20 mg total) by mouth daily. 30 tablet 3  . isosorbide mononitrate (IMDUR) 30 MG 24 hr tablet Take 1 tablet (30 mg total) by mouth daily. 30 tablet 3  . loratadine (CLARITIN) 10 MG tablet Take 1 tablet (10 mg total) by mouth every morning. 30 tablet 2  . losartan (COZAAR) 50 MG tablet Take 1 tablet (50 mg total) by mouth daily. 90 tablet 3  . LYRICA 75 MG capsule TAKE ONE CAPSULE BY MOUTH TWICE A DAY 60 capsule 0  . meclizine (ANTIVERT) 25 MG tablet Take 1 tablet (25 mg total) by mouth every 4 (four) hours as needed for dizziness. 60 tablet 0  . metoprolol succinate (TOPROL-XL) 50 MG 24 hr tablet Take 1 tablet (50 mg total) by mouth daily. Take with or immediately following a meal. 90 tablet 3  . Multiple Vitamin (MULTIVITAMIN) tablet Take 1 tablet by mouth every morning.     . nitroGLYCERIN (NITROSTAT) 0.4 MG SL tablet Place 1 tablet (0.4 mg total) under the tongue every 5 (five) minutes as needed for chest pain. 25 tablet 3  . Omega-3 Fatty Acids (FISH OIL) 1200 MG CAPS Take 1 capsule by mouth every morning.     Marland Kitchen omeprazole (PRILOSEC) 40 MG capsule Take 40 mg by mouth daily.     . potassium chloride SA (K-DUR,KLOR-CON) 10 MEQ tablet Take 1 tablet (10 mEq total) by mouth daily. 30 tablet 3  . PROAIR HFA 108 (90 Base) MCG/ACT inhaler INHALE 2 PUFFS INTO THE LUNGS EVERY 4 HOURS AS NEEDED (Patient taking differently: INHALE 2 PUFFS INTO THE LUNGS EVERY 4 HOURS AS NEEDED FOR SOB AND WHEEZING) 8.5 g 5  . prasugrel (EFFIENT) 10 MG TABS tablet Take 10 mg by mouth daily.     No current facility-administered medications for this visit.     Allergies: Allergies  Allergen Reactions  . Codeine     REACTION: nausea and vomiting  . Penicillins     REACTION: rash    Social  History: The patient  reports that she has never smoked. She has never used smokeless tobacco. She reports that she does not use drugs.   Family History: The patient's family history includes Hypertension in her father; Mitral valve prolapse in her mother.   Review of Systems: Please see the history of present illness.   Otherwise, the review of systems is positive for none.   All other systems are reviewed and negative.   Physical Exam: VS:  BP 128/80 (BP Location: Left Arm, Patient Position: Sitting, Cuff Size: Normal)   Pulse 78   Ht  5' 2"  (1.575 m)   Wt 156 lb 1.9 oz (70.8 kg)   SpO2 96%   BMI 28.55 kg/m  .  BMI Body mass index is 28.55 kg/m.  Wt Readings from Last 3 Encounters:  01/24/17 156 lb 1.9 oz (70.8 kg)  01/12/17 159 lb 12.8 oz (72.5 kg)  01/06/17 158 lb 8 oz (71.9 kg)    General: Anxious. Alert and in no acute distress.   HEENT: Normal.  Neck: Supple, no JVD, carotid bruits, or masses noted.  Cardiac: Regular rate and rhythm. No murmurs, rubs, or gallops. No edema.  Respiratory:  Lungs are clear to auscultation bilaterally with normal work of breathing.  GI: Soft and nontender.  MS: No deformity or atrophy. Gait and ROM intact.  Skin: Warm and dry. Color is normal.  Neuro:  Strength and sensation are intact and no gross focal deficits noted.  Psych: Alert, appropriate and with normal affect.   LABORATORY DATA:  EKG:  EKG is not ordered today.  Lab Results  Component Value Date   WBC 11.5 (H) 01/12/2017   HGB 12.8 01/12/2017   HCT 38.3 01/12/2017   PLT 447 (H) 01/12/2017   GLUCOSE 96 01/12/2017   CHOL 144 01/02/2017   TRIG 193 (H) 01/02/2017   HDL 23 (L) 01/02/2017   LDLDIRECT 151.0 09/09/2011   LDLCALC 82 01/02/2017   ALT 41 (H) 01/06/2017   AST 41 (H) 01/06/2017   NA 143 01/12/2017   K 3.5 01/12/2017   CL 102 01/12/2017   CREATININE 0.83 01/12/2017   BUN 14 01/12/2017   CO2 25 01/12/2017   TSH 2.95 09/20/2016   INR 1.12 01/02/2017   HGBA1C  6.1 (H) 01/02/2017      BNP (last 3 results) No results for input(s): BNP in the last 8760 hours.  ProBNP (last 3 results)  Recent Labs  01/12/17 1232  PROBNP 417*     Other Studies Reviewed Today:  Cath 01/03/17 Conclusions: 1. Significant 2-vessel coronary artery disease, including diffuse LAD disease with up to 99% stenosis in the mid portion and TIMI-2 flow, as well as 90% small ramus lesion and 40% proximal/mid LCx disease. 2. Moderately reduced LV contraction (LVEF 35-45%) with anterior and apical akinesis. 3. Moderately elevated left ventricular filling pressure. 4. Successful IVUS-guided PCI to the mid and distal LAD with placement of 2 overlapping Resolute Onyx drug-eluting stents with 0% residual stenosis and TIMI-3 flow.  Recommendations: 1. Dual antiplatelet therapy with aspirin and ticagrelor for at least 12 months, ideally longer. 2. Aggressive secondary prevention. 3. Medical therapy for small ramus intermedius and jailed diagonal branch, as PCI would be challenging given small vessel size. 4. Diuresis and titration of evidence-based heart failure therapy, given moderately reduced LV contraction with elevated filling pressure.   Echocardiogram 01/03/17:  Study Conclusions - Left ventricle: The cavity size was normal. Wall thickness was normal. Systolic function was mildly to moderately reduced. The estimated ejection fraction was in the range of 40% to 45%. Hypokinesis of the mid-apicalanteroseptal, anterior, and apical myocardium. Doppler parameters are consistent with abnormal left ventricular relaxation (grade 1 diastolic dysfunction). Doppler parameters are consistent with elevated mean left atrial filling pressure. - Pericardium, extracardiac: A trivial pericardial effusion was identified.   Assessment/Plan: 1. CAD - recent NSTEMI with cath/PCI with stent x 2 to the LAD - on DAPT - switched over to Effient due to possible  reaction with Brilinta - seems to be better. I think that overall she has improved but my  concern is that her "fearfulness" is going to keep her from actually progressing. I am going to add back her PPI, increase her Imdur and allow her to increase her activities. Ok to drive. Ok to attend cardiac rehab. Explained that if she was not able to progress then may need to consider repeat cath.   2. Newly diagnosed ICM - ARB increased at her last visit. Will need follow up echo in a few months to recheck. Imdur increased today. Weight is down. Recheck BNP today.   3. HTN - ARB increased at last visit - BP has improved -   4. HLD - on high dose statin. Needs follow up fasting labs in 4 to 6 weeks.   5. Obesity - down 3 pounds. She is changing her diet - family very encouraging as well.   6. Indigestion - restart PPI  Current medicines are reviewed with the patient today.  The patient does not have concerns regarding medicines other than what has been noted above.  The following changes have been made:  See above.  Labs/ tests ordered today include:    Orders Placed This Encounter  Procedures  . Basic metabolic panel  . Hepatic function panel  . CBC  . Pro b natriuretic peptide (BNP)     Disposition:   FU with Dr. Marlou Porch in a few weeks.   Patient is agreeable to this plan and will call if any problems develop in the interim.   SignedTruitt Merle, NP  01/24/2017 2:48 PM  [place] [address] [place], [place]  [address] Phone: [phone] Fax: [phone]

## 2017-01-24 NOTE — Addendum Note (Signed)
Addended by: Juanda Crumble R on: 01/24/2017 02:09 PM   Modules accepted: Orders

## 2017-01-24 NOTE — Patient Instructions (Addendum)
We will be checking the following labs today - BMET, CBC, HPF, and BNP   Medication Instructions:    Continue with your current medicines.   Ok to take OTC Prilosec, Nexium, Prevacid as prescribed.   I am increasing the Isosorbide to a whole tablet each day    Testing/Procedures To Be Arranged:  N/A  Follow-Up:   See Dr. Marlou Porch in a few weeks with fasting labs.     Other Special Instructions:   Start walking every day - start at 5 minutes twice a day and increase by a minute a day  I will send a message to cardiac rehab.     If you need a refill on your cardiac medications before your next appointment, please call your pharmacy.   Call the [place] office at [phone] if you have any questions, problems or concerns.

## 2017-01-25 ENCOUNTER — Other Ambulatory Visit: Payer: Self-pay | Admitting: Nurse Practitioner

## 2017-01-25 LAB — BASIC METABOLIC PANEL
BUN/Creatinine Ratio: 17 (ref 12–28)
BUN: 14 mg/dL (ref 8–27)
CO2: 23 mmol/L (ref 20–29)
Calcium: 10.1 mg/dL (ref 8.7–10.3)
Chloride: 104 mmol/L (ref 96–106)
Creatinine, Ser: 0.81 mg/dL (ref 0.57–1.00)
GFR calc Af Amer: 86 mL/min/{1.73_m2} (ref >59)
GFR calc non Af Amer: 75 mL/min/{1.73_m2} (ref >59)
Glucose: 88 mg/dL (ref 65–99)
Potassium: 4.1 mmol/L (ref 3.5–5.2)
Sodium: 144 mmol/L (ref 134–144)

## 2017-01-25 LAB — CBC
Hematocrit: 39.8 % (ref 34.0–46.6)
Hemoglobin: 13.5 g/dL (ref 11.1–15.9)
MCH: 27.6 pg (ref 26.6–33.0)
MCHC: 33.9 g/dL (ref 31.5–35.7)
MCV: 81 fL (ref 79–97)
Platelets: 397 10*3/uL — ABNORMAL HIGH (ref 150–379)
RBC: 4.89 x10E6/uL (ref 3.77–5.28)
RDW: 16.3 % — ABNORMAL HIGH (ref 12.3–15.4)
WBC: 11.4 10*3/uL — ABNORMAL HIGH (ref 3.4–10.8)

## 2017-01-25 LAB — HEPATIC FUNCTION PANEL
ALT: 29 IU/L (ref 0–32)
AST: 41 IU/L — ABNORMAL HIGH (ref 0–40)
Albumin: 4.6 g/dL (ref 3.6–4.8)
Alkaline Phosphatase: 115 IU/L (ref 39–117)
Bilirubin Total: 0.5 mg/dL (ref 0.0–1.2)
Bilirubin, Direct: 0.15 mg/dL (ref 0.00–0.40)
Total Protein: 7.6 g/dL (ref 6.0–8.5)

## 2017-01-25 LAB — PRO B NATRIURETIC PEPTIDE: NT-Pro BNP: 310 pg/mL — ABNORMAL HIGH (ref 0–301)

## 2017-01-25 NOTE — Telephone Encounter (Signed)
Faxed to  [facility], [place] - [address]  [address] [place] [address]  Phone: [phone] Fax: [phone]

## 2017-01-25 NOTE — Telephone Encounter (Signed)
Last refill 12/21/16 #60 Last OV 01/06/17 Ok to refill?

## 2017-01-26 ENCOUNTER — Telehealth: Payer: Self-pay

## 2017-01-26 NOTE — Telephone Encounter (Signed)
Pt left v/m wanting to know if should schedule bone density in 2018 or 2019. Per result note of bone density done on 04/16/16 Dr Deborra Medina recommended next bone density in 2 years which would be 2019. Per DPR left detailed message with this info. FYI to Dr Deborra Medina.

## 2017-01-26 NOTE — Telephone Encounter (Signed)
Pt was notified.  

## 2017-01-26 NOTE — Telephone Encounter (Signed)
Ok to schedule in 2019.

## 2017-02-07 ENCOUNTER — Other Ambulatory Visit: Payer: Self-pay | Admitting: *Deleted

## 2017-02-07 MED ORDER — ISOSORBIDE MONONITRATE ER 30 MG PO TB24: 30.0000 mg | ORAL_TABLET | Freq: Every day | ORAL | 3 refills | Status: DC

## 2017-02-11 ENCOUNTER — Ambulatory Visit: Payer: PPO | Admitting: Cardiology

## 2017-02-21 ENCOUNTER — Other Ambulatory Visit: Payer: Self-pay | Admitting: Family Medicine

## 2017-02-22 ENCOUNTER — Encounter (HOSPITAL_COMMUNITY): Payer: Self-pay

## 2017-02-22 ENCOUNTER — Encounter (HOSPITAL_COMMUNITY)
Admission: RE | Admit: 2017-02-22 | Discharge: 2017-02-22 | Disposition: A | Discharge status: Home / Self Care | Payer: PPO | Source: Ambulatory Visit | Attending: Cardiology | Admitting: Cardiology

## 2017-02-22 ENCOUNTER — Telehealth: Payer: Self-pay | Admitting: Physician Assistant

## 2017-02-22 VITALS — BP 144/66 | HR 58 | Ht 62.0 in | Wt 157.2 lb

## 2017-02-22 DIAGNOSIS — M810 Age-related osteoporosis without current pathological fracture: Secondary | ICD-10-CM | POA: Diagnosis not present

## 2017-02-22 DIAGNOSIS — I2510 Atherosclerotic heart disease of native coronary artery without angina pectoris: Secondary | ICD-10-CM | POA: Diagnosis not present

## 2017-02-22 DIAGNOSIS — I214 Non-ST elevation (NSTEMI) myocardial infarction: Secondary | ICD-10-CM

## 2017-02-22 DIAGNOSIS — Z79899 Other long term (current) drug therapy: Secondary | ICD-10-CM | POA: Insufficient documentation

## 2017-02-22 DIAGNOSIS — I252 Old myocardial infarction: Secondary | ICD-10-CM | POA: Diagnosis not present

## 2017-02-22 DIAGNOSIS — I10 Essential (primary) hypertension: Secondary | ICD-10-CM | POA: Insufficient documentation

## 2017-02-22 DIAGNOSIS — Z7982 Long term (current) use of aspirin: Secondary | ICD-10-CM | POA: Diagnosis not present

## 2017-02-22 DIAGNOSIS — F419 Anxiety disorder, unspecified: Secondary | ICD-10-CM | POA: Insufficient documentation

## 2017-02-22 DIAGNOSIS — Z955 Presence of coronary angioplasty implant and graft: Secondary | ICD-10-CM | POA: Diagnosis not present

## 2017-02-22 NOTE — Progress Notes (Signed)
Cardiac Individual Treatment Plan  Patient Details  Name: Hannah Vasquez MRN: [id number] Date of Birth: October 29, 1950 Referring Provider:     [place] from 02/22/2017 in [place]  Referring Provider  Candee Furbish MD      Initial Encounter Date:    [place] from 02/22/2017 in [place]  Date  02/22/17  Referring Provider  Candee Furbish MD      Visit Diagnosis: 01/03/17 Stented coronary artery DES LAD  01/02/17 NSTEMI (non-ST elevated myocardial infarction) [facility])  Patient's Home Medications on Admission:  Current Outpatient Prescriptions:  .  aspirin EC 81 MG tablet, Take 81 mg by mouth every morning. , Disp: , Rfl:  .  atorvastatin (LIPITOR) 80 MG tablet, Take 1 tablet (80 mg total) by mouth daily at 6 PM., Disp: 30 tablet, Rfl: 3 .  Cholecalciferol (VITAMIN D3) 1000 UNITS CAPS, Take 1 capsule by mouth every morning. , Disp: , Rfl:  .  furosemide (LASIX) 20 MG tablet, Take 1 tablet (20 mg total) by mouth daily., Disp: 30 tablet, Rfl: 3 .  isosorbide mononitrate (IMDUR) 30 MG 24 hr tablet, Take 1 tablet (30 mg total) by mouth daily., Disp: 90 tablet, Rfl: 3 .  loratadine (CLARITIN) 10 MG tablet, Take 1 tablet (10 mg total) by mouth every morning., Disp: 30 tablet, Rfl: 2 .  losartan (COZAAR) 50 MG tablet, Take 1 tablet (50 mg total) by mouth daily., Disp: 90 tablet, Rfl: 3 .  metoprolol succinate (TOPROL-XL) 50 MG 24 hr tablet, Take 1 tablet (50 mg total) by mouth daily. Take with or immediately following a meal., Disp: 90 tablet, Rfl: 3 .  Multiple Vitamin (MULTIVITAMIN) tablet, Take 1 tablet by mouth every morning. , Disp: , Rfl:  .  nitroGLYCERIN (NITROSTAT) 0.4 MG SL tablet, Place 1 tablet (0.4 mg total) under the tongue every 5 (five) minutes as needed for chest pain., Disp: 25 tablet, Rfl: 3 .  Omega-3 Fatty Acids (FISH OIL) 1200 MG CAPS, Take 1 capsule by mouth every  morning. , Disp: , Rfl:  .  omeprazole (PRILOSEC) 40 MG capsule, Take 40 mg by mouth daily. , Disp: , Rfl:  .  potassium chloride SA (K-DUR,KLOR-CON) 10 MEQ tablet, Take 1 tablet (10 mEq total) by mouth daily., Disp: 30 tablet, Rfl: 3 .  prasugrel (EFFIENT) 10 MG TABS tablet, Take 10 mg by mouth daily., Disp: , Rfl:  .  PROAIR HFA 108 (90 Base) MCG/ACT inhaler, INHALE 2 PUFFS INTO THE LUNGS EVERY 4 HOURS AS NEEDED (Patient taking differently: INHALE 2 PUFFS INTO THE LUNGS EVERY 4 HOURS AS NEEDED FOR SOB AND WHEEZING), Disp: 8.5 g, Rfl: 5 .  LYRICA 75 MG capsule, TAKE ONE (1) CAPSULE BY MOUTH 2 TIMES DAILY, Disp: 60 capsule, Rfl: 0 .  meclizine (ANTIVERT) 25 MG tablet, Take 1 tablet (25 mg total) by mouth every 4 (four) hours as needed for dizziness. (Patient not taking: Reported on 02/22/2017), Disp: 60 tablet, Rfl: 0  Past Medical History: Past Medical History:  Diagnosis Date  . Allergy   . Anxiety   . CAD (coronary artery disease)   . Cataract   . Hemorrhoids   . Hypertension   . Mitral valve prolapse   . Osteoporosis   . Status post dilation of esophageal narrowing     Tobacco Use: History  Smoking Status  . Never Smoker  Smokeless Tobacco  . Never Used  Labs: Recent Review Flowsheet Data    Labs for ITP Cardiac and Pulmonary Rehab Latest Ref Rng & Units 11/08/2013 11/27/2014 09/16/2015 09/20/2016 01/02/2017   Cholestrol 0 - 200 mg/dL 189 168 178 147 144   LDLCALC 0 - 99 mg/dL 124(H) 102(H) 109(H) 84 82   LDLDIRECT mg/dL - - - - -   HDL >40 mg/dL 30.40(L) 27.90(L) 31.50(L) 26.30(L) 23(L)   Trlycerides <150 mg/dL 175.0(H) 189.0(H) 188.0(H) 184.0(H) 193(H)   Hemoglobin A1c 4.8 - 5.6 % - - - - 6.1(H)      Capillary Blood Glucose: No results found for: GLUCAP   Exercise Target Goals: Date: 02/22/17  Exercise Program Goal: Individual exercise prescription set with THRR, safety & activity barriers. Participant demonstrates ability to understand and report RPE using BORG  scale, to self-measure pulse accurately, and to acknowledge the importance of the exercise prescription.  Exercise Prescription Goal: Starting with aerobic activity 30 plus minutes a day, 3 days per week for initial exercise prescription. Provide home exercise prescription and guidelines that participant acknowledges understanding prior to discharge.  Activity Barriers & Risk Stratification:     Activity Barriers & Cardiac Risk Stratification - 02/22/17 0944      Activity Barriers & Cardiac Risk Stratification   Activity Barriers Other (comment);Joint Problems;Deconditioning;Back Problems;Muscular Weakness   Comments B knee scope ~10years ago, mid to low back and hip/gluteal pain, R shoulder surgery in 2006   Cardiac Risk Stratification High      6 Minute Walk:     6 Minute Walk    Row Name 02/22/17 1437         6 Minute Walk   Phase Initial     Distance 1403 feet     Walk Time 6 minutes     # of Rest Breaks 0     MPH 2.66     METS 2.8     RPE 12     VO2 Peak 9.81     Symptoms Yes (comment)     Comments c/o lightheadedness     Resting HR 58 bpm     Resting BP 144/64     Max Ex. HR 81 bpm     Max Ex. BP 124/74     2 Minute Post BP 122/72        Oxygen Initial Assessment:   Oxygen Re-Evaluation:   Oxygen Discharge (Final Oxygen Re-Evaluation):   Initial Exercise Prescription:     Initial Exercise Prescription - 02/22/17 1400      Date of Initial Exercise RX and Referring Provider   Date 02/22/17   Referring Provider Candee Furbish MD     Bike   Level 0.5   Minutes 10   METs 2.35     NuStep   Level 3   SPM 80   Minutes 10   METs 2     Track   Laps 10   Minutes 10   METs 2.74     Prescription Details   Frequency (times per week) 3   Duration Progress to 30 minutes of continuous aerobic without signs/symptoms of physical distress     Intensity   THRR 40-80% of Max Heartrate 61-122   Ratings of Perceived Exertion 11-15   Perceived Dyspnea  0-4     Progression   Progression Continue to progress workloads to maintain intensity without signs/symptoms of physical distress.     Resistance Training   Training Prescription Yes   Weight 2lbs   Reps 10-15  Perform Capillary Blood Glucose checks as needed.  Exercise Prescription Changes:   Exercise Comments:   Exercise Goals and Review:     Exercise Goals    Row Name 02/22/17 0948             Exercise Goals   Increase Physical Activity Yes       Intervention Provide advice, education, support and counseling about physical activity/exercise needs.;Develop an individualized exercise prescription for aerobic and resistive training based on initial evaluation findings, risk stratification, comorbidities and participant's personal goals.       Expected Outcomes Achievement of increased cardiorespiratory fitness and enhanced flexibility, muscular endurance and strength shown through measurements of functional capacity and personal statement of participant.       Increase Strength and Stamina Yes  increase walking tolerance and be able to keep up with grandkids and their activities. Learn activity limitations       Intervention Provide advice, education, support and counseling about physical activity/exercise needs.;Develop an individualized exercise prescription for aerobic and resistive training based on initial evaluation findings, risk stratification, comorbidities and participant's personal goals.       Expected Outcomes Achievement of increased cardiorespiratory fitness and enhanced flexibility, muscular endurance and strength shown through measurements of functional capacity and personal statement of participant.          Exercise Goals Re-Evaluation :    Discharge Exercise Prescription (Final Exercise Prescription Changes):   Nutrition:  Target Goals: Understanding of nutrition guidelines, daily intake of sodium <1596m, cholesterol <2081m calories 30% from  fat and 7% or less from saturated fats, daily to have 5 or more servings of fruits and vegetables.  Biometrics:     Pre Biometrics - 02/22/17 1534      Pre Biometrics   Height 5' 2"  (1.575 m)   Weight 157 lb 3 oz (71.3 kg)   Waist Circumference 39 inches   Hip Circumference 42.5 inches   Waist to Hip Ratio 0.92 %   BMI (Calculated) 28.8   Triceps Skinfold 31 mm   % Body Fat 31.2 %   Grip Strength 28 kg   Flexibility 12 in   Single Leg Stand 22 seconds       Nutrition Therapy Plan and Nutrition Goals:     Nutrition Therapy & Goals - 02/22/17 1340      Nutrition Therapy   Diet Therapeutic Lifestyle Changes     Personal Nutrition Goals   Nutrition Goal Pt to identify food quantities necessary to achieve weight loss of 6-24 lb (2.7-10.9 kg) at graduation from cardiac rehab.    Personal Goal #2 Pt to continue working toward incorporating heart healthy foods/making heart healthy lifestyle choices in her diet      Intervention Plan   Intervention Prescribe, educate and counsel regarding individualized specific dietary modifications aiming towards targeted core components such as weight, hypertension, lipid management, diabetes, heart failure and other comorbidities.   Expected Outcomes Short Term Goal: Understand basic principles of dietary content, such as calories, fat, sodium, cholesterol and nutrients.;Long Term Goal: Adherence to prescribed nutrition plan.      Nutrition Discharge: Nutrition Scores:     Nutrition Assessments - 02/22/17 1340      MEDFICTS Scores   Pre Score 7      Nutrition Goals Re-Evaluation:   Nutrition Goals Re-Evaluation:   Nutrition Goals Discharge (Final Nutrition Goals Re-Evaluation):   Psychosocial: Target Goals: Acknowledge presence or absence of significant depression and/or stress, maximize coping skills, provide positive support system.  Participant is able to verbalize types and ability to use techniques and skills needed for  reducing stress and depression.  Initial Review & Psychosocial Screening:     Initial Psych Review & Screening - 02/22/17 1018      Initial Review   Current issues with None Identified     Family Dynamics   Good Support System? Yes  daughter and granddaughter    Comments upon brief assessment, no psychosocial needs identified, no interventions necessary      Barriers   Psychosocial barriers to participate in program There are no identifiable barriers or psychosocial needs.     Screening Interventions   Interventions Encouraged to exercise;Provide feedback about the scores to participant      Quality of Life Scores:     Quality of Life - 02/22/17 1019      Quality of Life Scores   Health/Function Pre 24.4 %   Socioeconomic Pre 30 %   Psych/Spiritual Pre 30 %   Family Pre 28.8 %   GLOBAL Pre 27.4 %      PHQ-9: Recent Review Flowsheet Data    Depression screen [facility] 2/9 09/20/2016 09/16/2015 11/27/2014 11/27/2014   Decreased Interest 0 0 0 0   Down, Depressed, Hopeless 0 0 0 0   PHQ - 2 Score 0 0 0 0     Interpretation of Total Score  Total Score Depression Severity:  1-4 = Minimal depression, 5-9 = Mild depression, 10-14 = Moderate depression, 15-19 = Moderately severe depression, 20-27 = Severe depression   Psychosocial Evaluation and Intervention:   Psychosocial Re-Evaluation:   Psychosocial Discharge (Final Psychosocial Re-Evaluation):   Vocational Rehabilitation: Provide vocational rehab assistance to qualifying candidates.   Vocational Rehab Evaluation & Intervention:     Vocational Rehab - 02/22/17 1017      Initial Vocational Rehab Evaluation & Intervention   Assessment shows need for Vocational Rehabilitation Yes      Education: Education Goals: Education classes will be provided on a weekly basis, covering required topics. Participant will state understanding/return demonstration of topics presented.  Learning Barriers/Preferences:      Learning Barriers/Preferences - 02/22/17 0943      Learning Barriers/Preferences   Learning Barriers Sight  cataract surgery      Education Topics: Count Your Pulse:  -Group instruction provided by verbal instruction, demonstration, patient participation and written materials to support subject.  Instructors address importance of being able to find your pulse and how to count your pulse when at home without a heart monitor.  Patients get hands on experience counting their pulse with staff help and individually.   Heart Attack, Angina, and Risk Factor Modification:  -Group instruction provided by verbal instruction, video, and written materials to support subject.  Instructors address signs and symptoms of angina and heart attacks.    Also discuss risk factors for heart disease and how to make changes to improve heart health risk factors.   Functional Fitness:  -Group instruction provided by verbal instruction, demonstration, patient participation, and written materials to support subject.  Instructors address safety measures for doing things around the house.  Discuss how to get up and down off the floor, how to pick things up properly, how to safely get out of a chair without assistance, and balance training.   Meditation and Mindfulness:  -Group instruction provided by verbal instruction, patient participation, and written materials to support subject.  Instructor addresses importance of mindfulness and meditation practice to help reduce stress and improve awareness.  Instructor also leads participants through a meditation exercise.    Stretching for Flexibility and Mobility:  -Group instruction provided by verbal instruction, patient participation, and written materials to support subject.  Instructors lead participants through series of stretches that are designed to increase flexibility thus improving mobility.  These stretches are additional exercise for major muscle groups that are  typically performed during regular warm up and cool down.   Hands Only CPR:  -Group verbal, video, and participation provides a basic overview of AHA guidelines for community CPR. Role-play of emergencies allow participants the opportunity to practice calling for help and chest compression technique with discussion of AED use.   Hypertension: -Group verbal and written instruction that provides a basic overview of hypertension including the most recent diagnostic guidelines, risk factor reduction with self-care instructions and medication management.    Nutrition I class: Heart Healthy Eating:  -Group instruction provided by PowerPoint slides, verbal discussion, and written materials to support subject matter. The instructor gives an explanation and review of the Therapeutic Lifestyle Changes diet recommendations, which includes a discussion on lipid goals, dietary fat, sodium, fiber, plant stanol/sterol esters, sugar, and the components of a well-balanced, healthy diet.   CARDIAC REHAB PHASE II ORIENTATION from 02/22/2017 in [place]  Date  02/22/17  Educator  RD  Instruction Review Code  Not applicable      Nutrition II class: Lifestyle Skills:  -Group instruction provided by PowerPoint slides, verbal discussion, and written materials to support subject matter. The instructor gives an explanation and review of label reading, grocery shopping for heart health, heart healthy recipe modifications, and ways to make healthier choices when eating out.   CARDIAC REHAB PHASE II ORIENTATION from 02/22/2017 in [place]  Date  02/22/17  Educator  RD  Instruction Review Code  Not applicable      Diabetes Question & Answer:  -Group instruction provided by PowerPoint slides, verbal discussion, and written materials to support subject matter. The instructor gives an explanation and review of diabetes co-morbidities, pre- and  post-prandial blood glucose goals, pre-exercise blood glucose goals, signs, symptoms, and treatment of hypoglycemia and hyperglycemia, and foot care basics.   Diabetes Blitz:  -Group instruction provided by PowerPoint slides, verbal discussion, and written materials to support subject matter. The instructor gives an explanation and review of the physiology behind type 1 and type 2 diabetes, diabetes medications and rational behind using different medications, pre- and post-prandial blood glucose recommendations and Hemoglobin A1c goals, diabetes diet, and exercise including blood glucose guidelines for exercising safely.    Portion Distortion:  -Group instruction provided by PowerPoint slides, verbal discussion, written materials, and food models to support subject matter. The instructor gives an explanation of serving size versus portion size, changes in portions sizes over the last 20 years, and what consists of a serving from each food group.   Stress Management:  -Group instruction provided by verbal instruction, video, and written materials to support subject matter.  Instructors review role of stress in heart disease and how to cope with stress positively.     Exercising on Your Own:  -Group instruction provided by verbal instruction, power point, and written materials to support subject.  Instructors discuss benefits of exercise, components of exercise, frequency and intensity of exercise, and end points for exercise.  Also discuss use of nitroglycerin and activating EMS.  Review options of places to exercise outside of rehab.  Review guidelines for sex with heart  disease.   Cardiac Drugs I:  -Group instruction provided by verbal instruction and written materials to support subject.  Instructor reviews cardiac drug classes: antiplatelets, anticoagulants, beta blockers, and statins.  Instructor discusses reasons, side effects, and lifestyle considerations for each drug class.   Cardiac  Drugs II:  -Group instruction provided by verbal instruction and written materials to support subject.  Instructor reviews cardiac drug classes: angiotensin converting enzyme inhibitors (ACE-I), angiotensin II receptor blockers (ARBs), nitrates, and calcium channel blockers.  Instructor discusses reasons, side effects, and lifestyle considerations for each drug class.   Anatomy and Physiology of the Circulatory System:  Group verbal and written instruction and models provide basic cardiac anatomy and physiology, with the coronary electrical and arterial systems. Review of: AMI, Angina, Valve disease, Heart Failure, Peripheral Artery Disease, Cardiac Arrhythmia, Pacemakers, and the ICD.   Other Education:  -Group or individual verbal, written, or video instructions that support the educational goals of the cardiac rehab program.   Knowledge Questionnaire Score:     Knowledge Questionnaire Score - 02/22/17 1017      Knowledge Questionnaire Score   Pre Score 20/24      Core Components/Risk Factors/Patient Goals at Admission:     Personal Goals and Risk Factors at Admission - 02/22/17 1529      Core Components/Risk Factors/Patient Goals on Admission   Hypertension Yes   Intervention Provide education on lifestyle modifcations including regular physical activity/exercise, weight management, moderate sodium restriction and increased consumption of fresh fruit, vegetables, and low fat dairy, alcohol moderation, and smoking cessation.;Monitor prescription use compliance.   Expected Outcomes Short Term: Continued assessment and intervention until BP is < 140/1m HG in hypertensive participants. < 130/867mHG in hypertensive participants with diabetes, heart failure or chronic kidney disease.   Stress Yes   Intervention Offer individual and/or small group education and counseling on adjustment to heart disease, stress management and health-related lifestyle change. Teach and support self-help  strategies.;Refer participants experiencing significant psychosocial distress to appropriate mental health specialists for further evaluation and treatment. When possible, include family members and significant others in education/counseling sessions.   Expected Outcomes Short Term: Participant demonstrates changes in health-related behavior, relaxation and other stress management skills, ability to obtain effective social support, and compliance with psychotropic medications if prescribed.;Long Term: Emotional wellbeing is indicated by absence of clinically significant psychosocial distress or social isolation.      Core Components/Risk Factors/Patient Goals Review:    Core Components/Risk Factors/Patient Goals at Discharge (Final Review):    ITP Comments:     ITP Comments    Row Name 02/22/17 1017           ITP Comments Dr. TrFransico HimMedical Director           Comments: Patient attended orientation from 0800 to 1130 to review rules and guidelines for program. Completed 6 minute walk test, Intitial ITP, and exercise prescription.  VSS. Telemetry-Sinus Rhythm with a bundle branch block this has been previously documented. LiZakyraeported experiencing feeling lightheaded towards the end of her walk test..Sitting and standing blood pressure checked. See previous progress note for further documentation. Hannah Vasquez's symptoms resolved after she was given a snack and some lemonade.Hannah PallRN,BSN 02/22/2017 3:54 PM

## 2017-02-22 NOTE — Telephone Encounter (Signed)
Faxed to  [facility], [place]: [phone]

## 2017-02-22 NOTE — Telephone Encounter (Signed)
Please call her and let her know that I have reviewed this note - as well as my last note - would continue to monitor her BP. She has a visit with Dr. Marlou Porch next Monday - would keep that for now.   Burtis Junes, RN, [place] [address] [place], [place]  [address] [phone]

## 2017-02-22 NOTE — Telephone Encounter (Signed)
Hannah Vasquez with cardiac rehab paged hospital team regarding outpatient at cardiac rehab, also states she spoke to triage at our office. She wanted to make [place] aware that patient reported she's had some lightheadness ever since her MI. No syncope reported. Office note with Truitt Merle reviewed - pt expressed some fearfulness/anxiety noted regarding possibility of recurrent event as well as intermittent dizziness. Vitals were stable during cardiac rehab session with BPs 120s/70s, HR 58, NSR. Did a walk test without any difficulty, no CP, no gait instability. Does report her visual acuity isn't quite as good ever since MI but no focal abnormality. Last week she hit her head leaning back on a headboard but did not sustain any significant injury or trauma and no focal sx afterwards. Will forward to Truitt Merle who has met patient twice and may have a better idea of how to proceed to decide regarding any further workup needed.  Euell Schiff PA-C

## 2017-02-22 NOTE — Telephone Encounter (Signed)
Last refill 01/25/17 Last OV 01/06/17 Ok to refill?

## 2017-02-22 NOTE — Progress Notes (Signed)
Cardiac Rehab Medication Review by a Pharmacist  Does the patient  feel that his/her medications are working for him/her?  yes  Has the patient been experiencing any side effects to the medications prescribed?  no  Does the patient measure his/her own blood pressure or blood glucose at home?  yes   Does the patient have any problems obtaining medications due to transportation or finances?   no  Understanding of regimen: good Understanding of indications: good Potential of compliance: good   Pharmacist comments: Patient did not have questions or concerns regarding her medications. She expressed being new to her medication regimem and is getting used to taking them all.     Hannah Vasquez, PharmD Pharmacy Resident 02/22/2017 9:06 AM

## 2017-02-22 NOTE — Progress Notes (Signed)
Hannah Vasquez 66 y.o. female       Nutrition Screen & Note  1. 01/02/17 Stented coronary artery   2. 01/01/17 NSTEMI (non-ST elevated myocardial infarction) [facility])    Past Medical History:  Diagnosis Date  . Allergy   . Anxiety   . CAD (coronary artery disease)   . Cataract   . Hemorrhoids   . Hypertension   . Mitral valve prolapse   . Osteoporosis   . Status post dilation of esophageal narrowing    Meds reviewed.  HT: Ht Readings from Last 1 Encounters:  01/24/17 5' 2"  (1.575 m)    WT: Wt Readings from Last 3 Encounters:  01/24/17 156 lb 1.9 oz (70.8 kg)  01/12/17 159 lb 12.8 oz (72.5 kg)  01/06/17 158 lb 8 oz (71.9 kg)     BMI 28.8   Current tobacco use? No  Labs:  Lipid Panel     Component Value Date/Time   CHOL 144 01/02/2017 0144   TRIG 193 (H) 01/02/2017 0144   HDL 23 (L) 01/02/2017 0144   CHOLHDL 6.3 01/02/2017 0144   VLDL 39 01/02/2017 0144   LDLCALC 82 01/02/2017 0144   LDLDIRECT 151.0 09/09/2011 0919    Lab Results  Component Value Date   HGBA1C 6.1 (H) 01/02/2017   CBG (last 3)  No results for input(s): GLUCAP in the last 72 hours.  Nutrition Note Spoke with pt. Nutrition plan and goals discussed with pt. Pt is following Step 2 of the Therapeutic Lifestyle Changes diet. Pt wants to lose wt. Pt was following the Weight Watcher's diet before her heart event and has recently been trying to lose wt by making healthier food choices. Wt loss tips reviewed. Pt is pre-diabetic according to her last A1c. Pt expressed understanding of the information reviewed. Pt aware of nutrition education classes offered and plans on attending nutrition classes.  Nutrition Diagnosis ? Food-and nutrition-related knowledge deficit related to lack of exposure to information as related to diagnosis of: ? CVD ? Pre-DM ? Overweight related to excessive energy intake as evidenced by a BMI of 28.8  Nutrition Intervention ? Pt's individual nutrition plan reviewed with  pt. ? Benefits of adopting Therapeutic Lifestyle Changes discussed when Medficts reviewed.   ? Pt given handouts for: ? Nutrition I class ? Nutrition II class   Nutrition Goal(s):  ? Pt to identify food quantities necessary to achieve weight loss of 6-24 lb (2.7-10.9 kg) at graduation from cardiac rehab.  ? Pt to continue working toward incorporating heart healthy foods/making heart healthy lifestyle choices in her diet  Plan:  Pt to attend nutrition classes ? Nutrition I ? Nutrition II ? Portion Distortion  Will provide client-centered nutrition education as part of interdisciplinary care.   Monitor and evaluate progress toward nutrition goal with team.  Derek Mound, M.Ed, RD, LDN, CDE 02/22/2017 1:21 PM

## 2017-02-22 NOTE — Progress Notes (Signed)
Patient is here today for orientation and her walk test. Hannah Vasquez reports that she has been feeling a little lightheaded this morning. This has been an ongoing issue. Hannah Vasquez reported these symptoms towards the end of her walk test.  Sitting blood pressure 122/70 with a heart rate 58. Standing blood pressure 122/78 standing. Hannah Vasquez denies chest pain. Telemetry rhythm Sinus with a bundle branch block. Hannah Vasquez also reports that she hit  The back of her head this past Friday against a night stand while rolling over in bed this past Friday while she was out of town. Hannah Vasquez says she feels sore on the left side of her head otherwise patient has no other complaints. Dr Kingsley Plan nurse Jeannene Patella called and notified.Hannah Vasquez [facility] called and notified. Hinton Dyer said she will contact Truitt Merle NP about Hannah Vasquez's symptoms. Hannah Vasquez was given a banana and lemonade and reported feeling better after meeting with the dietitian. Hannah Vasquez left cardiac rehab without further complaints. Exit blood pressure 118/70. Oxygen saturation 97% on room air.Barnet Pall, RN,BSN 02/22/2017 3:12 PM

## 2017-02-23 NOTE — Telephone Encounter (Signed)
S/w pt is having a better day today.  Stated Cecille Rubin did review cardiac rehab note and last ov note and would just keep a list of bp readings and bring to ov with Dr. Kingsley Plan on Monday.

## 2017-02-28 ENCOUNTER — Encounter: Payer: Self-pay | Admitting: Cardiology

## 2017-02-28 ENCOUNTER — Encounter (HOSPITAL_COMMUNITY): Payer: PPO

## 2017-02-28 ENCOUNTER — Ambulatory Visit (INDEPENDENT_AMBULATORY_CARE_PROVIDER_SITE_OTHER): Payer: PPO | Admitting: Cardiology

## 2017-02-28 VITALS — BP 130/70 | HR 72 | Ht 62.0 in | Wt 152.6 lb

## 2017-02-28 DIAGNOSIS — E7800 Pure hypercholesterolemia, unspecified: Secondary | ICD-10-CM

## 2017-02-28 DIAGNOSIS — I214 Non-ST elevation (NSTEMI) myocardial infarction: Secondary | ICD-10-CM

## 2017-02-28 DIAGNOSIS — Z79899 Other long term (current) drug therapy: Secondary | ICD-10-CM

## 2017-02-28 DIAGNOSIS — I341 Nonrheumatic mitral (valve) prolapse: Secondary | ICD-10-CM | POA: Diagnosis not present

## 2017-02-28 DIAGNOSIS — R0602 Shortness of breath: Secondary | ICD-10-CM

## 2017-02-28 DIAGNOSIS — I2510 Atherosclerotic heart disease of native coronary artery without angina pectoris: Secondary | ICD-10-CM

## 2017-02-28 MED ORDER — FUROSEMIDE 20 MG PO TABS: 20.0000 mg | ORAL_TABLET | ORAL | 3 refills | Status: DC | PRN

## 2017-02-28 NOTE — Patient Instructions (Signed)
Medication Instructions:  You may take your Furosemide on a as needed basis. Continue all other medications listed.  Labwork: Please have blood work today (Lipid, CBC and CMP)  Testing/Procedures: Your physician has requested that you have an echocardiogram. Echocardiography is a painless test that uses sound waves to create images of your heart. It provides your doctor with information about the size and shape of your heart and how well your heart's chambers and valves are working. This procedure takes approximately one hour. There are no restrictions for this procedure.  Please limit your fluid intake to no more than 1.5 liters (1,500 ml) a day. Follow-Up: Follow up in 4 months with Dr Marlou Porch.  If you need a refill on your cardiac medications before your next appointment, please call your pharmacy.  Thank you for choosing [facility]!!

## 2017-02-28 NOTE — Progress Notes (Signed)
[place]. [address] [place], [place]  [address] Phone: [phone] Fax:  [phone]   Date:  02/28/2017   ID:  Hannah Vasquez 03-30-1951, MRN [id number]  PCP:  Hannah Passy, MD   History of Present Illness: Hannah Vasquez is a 66 y.o. female here for clinic follow up after non ST elevation myocardial infarction on 01/01/17 stating that she had chest pressure that waxed and waned then returned, troponin was 0.16. She went to the Cath Lab and received 2 stents to the LAD and showed an EF of 40-45%.Residual small ramus dz and jailed diag.   In review of Hannah Vasquez note from 01/12/17 she had pneumonia, shingles, heart attack, can get her strength back, short of breath Overall she's been doing quite well. She lost a significant amount of weight through exercise down to 142 pounds. She's gained some of this back. Her shortness of breath felt better with decreased weight and increased stamina. Reassuring. She was changed to Effient.  She had perhaps some mild transient blurry vision. She had cataract surgery previously. Blood pressure has fluctuated but currently normal.  Previous visits: 9/13 - shingles then PNA. Said she had shingles all over?. Started on leg. A doctor at the hospital when she was visiting her father turned to her and asked her what was wrong. He then got a second opinion and told her that she had shingles. 5/14 - back pain, injections, PT 3/15 - feeling so out of breath, walking from here to door. Fatigue.  Previously out of breath. No chest pain. At times feels chest tightness. When SOB happens, sometimes just talking at rest, needs to stop and take a deep breath. Felt winded.    In 2008 she underwent nuclear stress test which was low risk with no ischemia and normal ejection fraction. She also had an echocardiogram which showed normal ejection fraction and mild mitral valve prolapse.      Wt Readings from Last 3 Encounters:  02/28/17 152 lb 9.6 oz  (69.2 kg)  02/22/17 157 lb 3 oz (71.3 kg)  01/24/17 156 lb 1.9 oz (70.8 kg)     Past Medical History:  Diagnosis Date  . Allergy   . Anxiety   . CAD (coronary artery disease)   . Cataract   . Hemorrhoids   . Hypertension   . Mitral valve prolapse   . Osteoporosis   . Status post dilation of esophageal narrowing     Past Surgical History:  Procedure Laterality Date  . BASAL CELL CARCINOMA EXCISION  02/05/2016   Dr. Sarajane Jews, [facility]  . BLADDER SURGERY  11/15/1995   bladder neck suspension, urinary incontinence  . CARDIAC CATHETERIZATION  01/03/2017 DES LAD  . CATARACT EXTRACTION Left 04/30/2016   Dr. Tommy Rainwater, [place] N/A 01/03/2017   Procedure: Coronary Stent Intervention;  Surgeon: Nelva Bush, MD;  Location: [place] [facility];  Service: Cardiovascular;  Laterality: N/A;  . DENTAL SURGERY  06/20/2016   Tooth Implant; Dr. Kalman Shan  . INTRAVASCULAR ULTRASOUND/IVUS N/A 01/03/2017   Procedure: Intravascular Ultrasound/IVUS;  Surgeon: Nelva Bush, MD;  Location: [place] [facility];  Service: Cardiovascular;  Laterality: N/A;  . KNEE SURGERY  2003   torn meniscus  . LEFT HEART CATH AND CORONARY ANGIOGRAPHY N/A 01/03/2017   Procedure: Left Heart Cath and Coronary Angiography;  Surgeon: Nelva Bush, MD;  Location: [place] [facility];  Service: Cardiovascular;  Laterality: N/A;  .  MOHS SURGERY Right 2017   right side of face  . SHOULDER SURGERY Right 2005    Current Outpatient Prescriptions  Medication Sig Dispense Refill  . albuterol (PROVENTIL HFA;VENTOLIN HFA) 108 (90 Base) MCG/ACT inhaler Inhale 1 puff into the lungs every 6 (six) hours as needed for wheezing or shortness of breath.    Marland Kitchen albuterol (PROVENTIL) (2.5 MG/3ML) 0.083% nebulizer solution Take 2.5 mg by nebulization every 6 (six) hours as needed for wheezing or shortness of breath.    Marland Kitchen aspirin EC 81 MG tablet Take 81 mg by mouth every morning.     Marland Kitchen  atorvastatin (LIPITOR) 80 MG tablet Take 1 tablet (80 mg total) by mouth daily at 6 PM. 30 tablet 3  . Cholecalciferol (VITAMIN D3) 1000 UNITS CAPS Take 1 capsule by mouth every morning.     . furosemide (LASIX) 20 MG tablet Take 1 tablet (20 mg total) by mouth daily. 30 tablet 3  . isosorbide mononitrate (IMDUR) 30 MG 24 hr tablet Take 1 tablet (30 mg total) by mouth daily. 90 tablet 3  . loratadine (CLARITIN) 10 MG tablet Take 1 tablet (10 mg total) by mouth every morning. 30 tablet 2  . losartan (COZAAR) 50 MG tablet Take 1 tablet (50 mg total) by mouth daily. 90 tablet 3  . LYRICA 75 MG capsule TAKE ONE (1) CAPSULE BY MOUTH 2 TIMES DAILY 60 capsule 0  . metoprolol succinate (TOPROL-XL) 50 MG 24 hr tablet Take 1 tablet (50 mg total) by mouth daily. Take with or immediately following a meal. 90 tablet 3  . Multiple Vitamin (MULTIVITAMIN) tablet Take 1 tablet by mouth every morning.     . nitroGLYCERIN (NITROSTAT) 0.4 MG SL tablet Place 1 tablet (0.4 mg total) under the tongue every 5 (five) minutes as needed for chest pain. 25 tablet 3  . Omega-3 Fatty Acids (FISH OIL) 1200 MG CAPS Take 1 capsule by mouth every morning.     Marland Kitchen omeprazole (PRILOSEC) 40 MG capsule Take 40 mg by mouth daily.     . potassium chloride SA (K-DUR,KLOR-CON) 10 MEQ tablet Take 1 tablet (10 mEq total) by mouth daily. 30 tablet 3  . prasugrel (EFFIENT) 10 MG TABS tablet Take 10 mg by mouth daily.     No current facility-administered medications for this visit.     Allergies:    Allergies  Allergen Reactions  . Codeine     REACTION: nausea and vomiting  . Penicillins Nausea And Vomiting    REACTION: rash    Social History:  The patient  reports that she has never smoked. She has never used smokeless tobacco. She reports that she does not drink alcohol or use drugs.  Retired Family History  Problem Relation Age of Onset  . Mitral valve prolapse Mother   . Hypertension Father     ROS:  Please see the history of  present illness.   No HA, no fevers.    All other systems reviewed and negative.   PHYSICAL EXAM: VS:  BP 130/70   Pulse 72   Ht 5' 2"  (1.575 m)   Wt 152 lb 9.6 oz (69.2 kg)   LMP  (LMP Unknown)   BMI 27.91 kg/m  GEN: Well nourished, well developed, in no acute distress  HEENT: normal  Neck: no JVD, carotid bruits, or masses Cardiac: RRR; no murmurs, rubs, or gallops,no edema  Respiratory:  clear to auscultation bilaterally, normal work of breathing GI: soft, nontender, nondistended, + BS MS:  no deformity or atrophy  Skin: warm and dry, no rash Neuro:  Alert and Oriented x 3, Strength and sensation are intact Psych: euthymic mood, full affect   EKG:  04/15/16-sinus bradycardia, 50 right bundle branch block personally viewed-no significant change from prior SR RBBB.   Chronic  NUC 2008 - low risk no ischemia.   ECHO: 01/03/14: - The LV function is normal. There is minimal MVP with trace and insignificant MR.  LABS: LDL 124, creatinine 0.9, hemoglobin 14.1, vitamin B12 232, TSH 2.23  Cath Cath 01/03/17 Conclusions: 1. Significant 2-vessel coronary artery disease, including diffuse LAD disease with up to 99% stenosis in the mid portion and TIMI-2 flow, as well as 90% small ramus lesion and 40% proximal/mid LCx disease. 2. Moderately reduced LV contraction (LVEF 35-45%) with anterior and apical akinesis. 3. Moderately elevated left ventricular filling pressure. 4. Successful IVUS-guided PCI to the mid and distal LAD with placement of 2 overlapping Resolute Onyx drug-eluting stents with 0% residual stenosis and TIMI-3 flow.  Recommendations: 1. Dual antiplatelet therapy with aspirin and ticagrelor for at least 12 months, ideally longer. 2. Aggressive secondary prevention. 3. Medical therapy for small ramus intermedius and jailed diagonal branch, as PCI would be challenging given small vessel size. 4. Diuresis and titration of evidence-based heart failure therapy, given  moderately reduced LV contraction with elevated filling pressure.   Echocardiogram 01/03/17:  Study Conclusions - Left ventricle: The cavity size was normal. Wall thickness was normal. Systolic function was mildly to moderately reduced. The estimated ejection fraction was in the range of 40% to 45%. Hypokinesis of the mid-apicalanteroseptal, anterior, and apical myocardium. Doppler parameters are consistent with abnormal left ventricular relaxation (grade 1 diastolic dysfunction). Doppler parameters are consistent with elevated mean left atrial filling pressure. - Pericardium, extracardiac: A trivial pericardial effusion was identified.   ASSESSMENT AND PLAN:   Non-ST elevation myocardial infarction  - LAD stents 2, residual ramus disease, jailed diagonal.  - Switch from Brilinta to Effient. Some shortness of breath symptoms.  - Starting cardiac rehabilitation.  - Repeat echocardiogram, EF was 40-45% with anteroseptal wall hypokinesis. But see if there is any improvement with post revascularization.  Hyperlipidemia  - Continue with high intensity statin therapy. Checking lipid panel and LFTs today.  Shortness of breath  - Improved but still somewhat present.  - Continue with isosorbide low-dose.  - I will give her the liberty to take Lasix when necessary at this point  - Cardiac rehabilitation.  No significant mitral valve prolapse was seen on prior echo.  Signed, Candee Furbish, MD [facility]  02/28/2017 12:03 PM

## 2017-03-01 LAB — LIPID PANEL
CHOL/HDL RATIO: 3.8 ratio (ref 0.0–4.4)
Cholesterol, Total: 102 mg/dL (ref 100–199)
HDL: 27 mg/dL — AB (ref >39)
LDL CALC: 44 mg/dL (ref 0–99)
TRIGLYCERIDES: 156 mg/dL — AB (ref 0–149)
VLDL CHOLESTEROL CAL: 31 mg/dL (ref 5–40)

## 2017-03-01 LAB — CBC
HEMOGLOBIN: 12.7 g/dL (ref 11.1–15.9)
Hematocrit: 37.8 % (ref 34.0–46.6)
MCH: 27 pg (ref 26.6–33.0)
MCHC: 33.6 g/dL (ref 31.5–35.7)
MCV: 80 fL (ref 79–97)
PLATELETS: 347 10*3/uL (ref 150–379)
RBC: 4.71 x10E6/uL (ref 3.77–5.28)
RDW: 15.8 % — ABNORMAL HIGH (ref 12.3–15.4)
WBC: 9.8 10*3/uL (ref 3.4–10.8)

## 2017-03-01 LAB — COMPREHENSIVE METABOLIC PANEL
ALT: 24 IU/L (ref 0–32)
AST: 28 IU/L (ref 0–40)
Albumin/Globulin Ratio: 1.6 (ref 1.2–2.2)
Albumin: 4.7 g/dL (ref 3.6–4.8)
Alkaline Phosphatase: 136 IU/L — ABNORMAL HIGH (ref 39–117)
BILIRUBIN TOTAL: 0.7 mg/dL (ref 0.0–1.2)
BUN/Creatinine Ratio: 18 (ref 12–28)
BUN: 15 mg/dL (ref 8–27)
CHLORIDE: 103 mmol/L (ref 96–106)
CO2: 24 mmol/L (ref 20–29)
Calcium: 9.6 mg/dL (ref 8.7–10.3)
Creatinine, Ser: 0.85 mg/dL (ref 0.57–1.00)
GFR calc Af Amer: 81 mL/min/{1.73_m2} (ref >59)
GFR calc non Af Amer: 71 mL/min/{1.73_m2} (ref >59)
GLOBULIN, TOTAL: 2.9 g/dL (ref 1.5–4.5)
Glucose: 99 mg/dL (ref 65–99)
POTASSIUM: 3.7 mmol/L (ref 3.5–5.2)
SODIUM: 144 mmol/L (ref 134–144)
Total Protein: 7.6 g/dL (ref 6.0–8.5)

## 2017-03-02 ENCOUNTER — Encounter (HOSPITAL_COMMUNITY)
Admission: RE | Admit: 2017-03-02 | Discharge: 2017-03-02 | Disposition: A | Discharge status: Home / Self Care | Payer: PPO | Source: Ambulatory Visit | Attending: Cardiology | Admitting: Cardiology

## 2017-03-02 DIAGNOSIS — I214 Non-ST elevation (NSTEMI) myocardial infarction: Secondary | ICD-10-CM

## 2017-03-02 DIAGNOSIS — I252 Old myocardial infarction: Secondary | ICD-10-CM | POA: Diagnosis not present

## 2017-03-02 DIAGNOSIS — Z955 Presence of coronary angioplasty implant and graft: Secondary | ICD-10-CM

## 2017-03-02 NOTE — Progress Notes (Signed)
Cardiac Individual Treatment Plan  Patient Details  Name: Hannah Vasquez MRN: [id number] Date of Birth: 04-Mar-1951 Referring Provider:     [place] from 02/22/2017 in [place]  Referring Provider  Candee Furbish MD      Initial Encounter Date:    [place] from 02/22/2017 in [place]  Date  02/22/17  Referring Provider  Candee Furbish MD      Visit Diagnosis: 01/03/17 Stented coronary artery DES LAD  01/02/17 NSTEMI (non-ST elevated myocardial infarction) [facility])  Patient's Home Medications on Admission:  Current Outpatient Prescriptions:  .  albuterol (PROVENTIL HFA;VENTOLIN HFA) 108 (90 Base) MCG/ACT inhaler, Inhale 1 puff into the lungs every 6 (six) hours as needed for wheezing or shortness of breath., Disp: , Rfl:  .  albuterol (PROVENTIL) (2.5 MG/3ML) 0.083% nebulizer solution, Take 2.5 mg by nebulization every 6 (six) hours as needed for wheezing or shortness of breath., Disp: , Rfl:  .  aspirin EC 81 MG tablet, Take 81 mg by mouth every morning. , Disp: , Rfl:  .  atorvastatin (LIPITOR) 80 MG tablet, Take 1 tablet (80 mg total) by mouth daily at 6 PM., Disp: 30 tablet, Rfl: 3 .  Cholecalciferol (VITAMIN D3) 1000 UNITS CAPS, Take 1 capsule by mouth every morning. , Disp: , Rfl:  .  furosemide (LASIX) 20 MG tablet, Take 1 tablet (20 mg total) by mouth as needed., Disp: 30 tablet, Rfl: 3 .  isosorbide mononitrate (IMDUR) 30 MG 24 hr tablet, Take 1 tablet (30 mg total) by mouth daily., Disp: 90 tablet, Rfl: 3 .  loratadine (CLARITIN) 10 MG tablet, Take 1 tablet (10 mg total) by mouth every morning., Disp: 30 tablet, Rfl: 2 .  losartan (COZAAR) 50 MG tablet, Take 1 tablet (50 mg total) by mouth daily., Disp: 90 tablet, Rfl: 3 .  LYRICA 75 MG capsule, TAKE ONE (1) CAPSULE BY MOUTH 2 TIMES DAILY, Disp: 60 capsule, Rfl: 0 .  metoprolol succinate (TOPROL-XL) 50 MG 24 hr tablet,  Take 1 tablet (50 mg total) by mouth daily. Take with or immediately following a meal., Disp: 90 tablet, Rfl: 3 .  Multiple Vitamin (MULTIVITAMIN) tablet, Take 1 tablet by mouth every morning. , Disp: , Rfl:  .  nitroGLYCERIN (NITROSTAT) 0.4 MG SL tablet, Place 1 tablet (0.4 mg total) under the tongue every 5 (five) minutes as needed for chest pain., Disp: 25 tablet, Rfl: 3 .  Omega-3 Fatty Acids (FISH OIL) 1200 MG CAPS, Take 1 capsule by mouth every morning. , Disp: , Rfl:  .  omeprazole (PRILOSEC) 40 MG capsule, Take 40 mg by mouth daily. , Disp: , Rfl:  .  potassium chloride SA (K-DUR,KLOR-CON) 10 MEQ tablet, Take 1 tablet (10 mEq total) by mouth daily., Disp: 30 tablet, Rfl: 3 .  prasugrel (EFFIENT) 10 MG TABS tablet, Take 10 mg by mouth daily., Disp: , Rfl:   Past Medical History: Past Medical History:  Diagnosis Date  . Allergy   . Anxiety   . CAD (coronary artery disease)   . Cataract   . Hemorrhoids   . Hypertension   . Mitral valve prolapse   . Osteoporosis   . Status post dilation of esophageal narrowing     Tobacco Use: History  Smoking Status  . Never Smoker  Smokeless Tobacco  . Never Used    Labs: Recent Review Flowsheet Data    Labs for ITP  Cardiac and Pulmonary Rehab Latest Ref Rng & Units 11/27/2014 09/16/2015 09/20/2016 01/02/2017 02/28/2017   Cholestrol 100 - 199 mg/dL 168 178 147 144 102   LDLCALC 0 - 99 mg/dL 102(H) 109(H) 84 82 44   LDLDIRECT mg/dL - - - - -   HDL >39 mg/dL 27.90(L) 31.50(L) 26.30(L) 23(L) 27(L)   Trlycerides 0 - 149 mg/dL 189.0(H) 188.0(H) 184.0(H) 193(H) 156(H)   Hemoglobin A1c 4.8 - 5.6 % - - - 6.1(H) -      Capillary Blood Glucose: No results found for: GLUCAP   Exercise Target Goals:    Exercise Program Goal: Individual exercise prescription set with THRR, safety & activity barriers. Participant demonstrates ability to understand and report RPE using BORG scale, to self-measure pulse accurately, and to acknowledge the importance  of the exercise prescription.  Exercise Prescription Goal: Starting with aerobic activity 30 plus minutes a day, 3 days per week for initial exercise prescription. Provide home exercise prescription and guidelines that participant acknowledges understanding prior to discharge.  Activity Barriers & Risk Stratification:     Activity Barriers & Cardiac Risk Stratification - 02/22/17 0944      Activity Barriers & Cardiac Risk Stratification   Activity Barriers Other (comment);Joint Problems;Deconditioning;Back Problems;Muscular Weakness   Comments B knee scope ~10years ago, mid to low back and hip/gluteal pain, R shoulder surgery in 2006   Cardiac Risk Stratification High      6 Minute Walk:     6 Minute Walk    Row Name 02/22/17 1437         6 Minute Walk   Phase Initial     Distance 1403 feet     Walk Time 6 minutes     # of Rest Breaks 0     MPH 2.66     METS 2.8     RPE 12     VO2 Peak 9.81     Symptoms Yes (comment)     Comments c/o lightheadedness     Resting HR 58 bpm     Resting BP 144/64     Max Ex. HR 81 bpm     Max Ex. BP 124/74     2 Minute Post BP 122/72        Oxygen Initial Assessment:   Oxygen Re-Evaluation:   Oxygen Discharge (Final Oxygen Re-Evaluation):   Initial Exercise Prescription:     Initial Exercise Prescription - 02/22/17 1400      Date of Initial Exercise RX and Referring Provider   Date 02/22/17   Referring Provider Candee Furbish MD     Bike   Level 0.5   Minutes 10   METs 2.35     NuStep   Level 3   SPM 80   Minutes 10   METs 2     Track   Laps 10   Minutes 10   METs 2.74     Prescription Details   Frequency (times per week) 3   Duration Progress to 30 minutes of continuous aerobic without signs/symptoms of physical distress     Intensity   THRR 40-80% of Max Heartrate 61-122   Ratings of Perceived Exertion 11-15   Perceived Dyspnea 0-4     Progression   Progression Continue to progress workloads to  maintain intensity without signs/symptoms of physical distress.     Resistance Training   Training Prescription Yes   Weight 2lbs   Reps 10-15      Perform Capillary Blood Glucose checks as  needed.  Exercise Prescription Changes:   Exercise Comments:   Exercise Goals and Review:      Exercise Goals    Row Name 02/22/17 0948             Exercise Goals   Increase Physical Activity Yes       Intervention Provide advice, education, support and counseling about physical activity/exercise needs.;Develop an individualized exercise prescription for aerobic and resistive training based on initial evaluation findings, risk stratification, comorbidities and participant's personal goals.       Expected Outcomes Achievement of increased cardiorespiratory fitness and enhanced flexibility, muscular endurance and strength shown through measurements of functional capacity and personal statement of participant.       Increase Strength and Stamina Yes  increase walking tolerance and be able to keep up with grandkids and their activities. Learn activity limitations       Intervention Provide advice, education, support and counseling about physical activity/exercise needs.;Develop an individualized exercise prescription for aerobic and resistive training based on initial evaluation findings, risk stratification, comorbidities and participant's personal goals.       Expected Outcomes Achievement of increased cardiorespiratory fitness and enhanced flexibility, muscular endurance and strength shown through measurements of functional capacity and personal statement of participant.          Exercise Goals Re-Evaluation :    Discharge Exercise Prescription (Final Exercise Prescription Changes):   Nutrition:  Target Goals: Understanding of nutrition guidelines, daily intake of sodium <1531m, cholesterol <2061m calories 30% from fat and 7% or less from saturated fats, daily to have 5 or more  servings of fruits and vegetables.  Biometrics:     Pre Biometrics - 02/22/17 1534      Pre Biometrics   Height 5' 2"  (1.575 m)   Weight 157 lb 3 oz (71.3 kg)   Waist Circumference 39 inches   Hip Circumference 42.5 inches   Waist to Hip Ratio 0.92 %   BMI (Calculated) 28.8   Triceps Skinfold 31 mm   % Body Fat 31.2 %   Grip Strength 28 kg   Flexibility 12 in   Single Leg Stand 22 seconds       Nutrition Therapy Plan and Nutrition Goals:     Nutrition Therapy & Goals - 02/22/17 1340      Nutrition Therapy   Diet Therapeutic Lifestyle Changes     Personal Nutrition Goals   Nutrition Goal Pt to identify food quantities necessary to achieve weight loss of 6-24 lb (2.7-10.9 kg) at graduation from cardiac rehab.    Personal Goal #2 Pt to continue working toward incorporating heart healthy foods/making heart healthy lifestyle choices in her diet      Intervention Plan   Intervention Prescribe, educate and counsel regarding individualized specific dietary modifications aiming towards targeted core components such as weight, hypertension, lipid management, diabetes, heart failure and other comorbidities.   Expected Outcomes Short Term Goal: Understand basic principles of dietary content, such as calories, fat, sodium, cholesterol and nutrients.;Long Term Goal: Adherence to prescribed nutrition plan.      Nutrition Discharge: Nutrition Scores:     Nutrition Assessments - 02/22/17 1340      MEDFICTS Scores   Pre Score 7      Nutrition Goals Re-Evaluation:   Nutrition Goals Re-Evaluation:   Nutrition Goals Discharge (Final Nutrition Goals Re-Evaluation):   Psychosocial: Target Goals: Acknowledge presence or absence of significant depression and/or stress, maximize coping skills, provide positive support system. Participant is able to verbalize  types and ability to use techniques and skills needed for reducing stress and depression.  Initial Review & Psychosocial  Screening:     Initial Psych Review & Screening - 02/22/17 1018      Initial Review   Current issues with None Identified     Family Dynamics   Good Support System? Yes  daughter and granddaughter    Comments upon brief assessment, no psychosocial needs identified, no interventions necessary      Barriers   Psychosocial barriers to participate in program There are no identifiable barriers or psychosocial needs.     Screening Interventions   Interventions Encouraged to exercise;Provide feedback about the scores to participant      Quality of Life Scores:     Quality of Life - 02/22/17 1019      Quality of Life Scores   Health/Function Pre 24.4 %   Socioeconomic Pre 30 %   Psych/Spiritual Pre 30 %   Family Pre 28.8 %   GLOBAL Pre 27.4 %      PHQ-9: Recent Review Flowsheet Data    Depression screen [facility] 2/9 03/02/2017 09/20/2016 09/16/2015 11/27/2014 11/27/2014   Decreased Interest 0 0 0 0 0   Down, Depressed, Hopeless 0 0 0 0 0   PHQ - 2 Score 0 0 0 0 0     Interpretation of Total Score  Total Score Depression Severity:  1-4 = Minimal depression, 5-9 = Mild depression, 10-14 = Moderate depression, 15-19 = Moderately severe depression, 20-27 = Severe depression   Psychosocial Evaluation and Intervention:   Psychosocial Re-Evaluation:   Psychosocial Discharge (Final Psychosocial Re-Evaluation):   Vocational Rehabilitation: Provide vocational rehab assistance to qualifying candidates.   Vocational Rehab Evaluation & Intervention:     Vocational Rehab - 03/02/17 1247      Initial Vocational Rehab Evaluation & Intervention   Assessment shows need for Vocational Rehabilitation No     Vocational Rehab Re-Evaulation   Comments --  Jamekia is retired and does not need vocational rehab at this time.      Education: Education Goals: Education classes will be provided on a weekly basis, covering required topics. Participant will state understanding/return  demonstration of topics presented.  Learning Barriers/Preferences:     Learning Barriers/Preferences - 02/22/17 0943      Learning Barriers/Preferences   Learning Barriers Sight  cataract surgery      Education Topics: Count Your Pulse:  -Group instruction provided by verbal instruction, demonstration, patient participation and written materials to support subject.  Instructors address importance of being able to find your pulse and how to count your pulse when at home without a heart monitor.  Patients get hands on experience counting their pulse with staff help and individually.   Heart Attack, Angina, and Risk Factor Modification:  -Group instruction provided by verbal instruction, video, and written materials to support subject.  Instructors address signs and symptoms of angina and heart attacks.    Also discuss risk factors for heart disease and how to make changes to improve heart health risk factors.   Functional Fitness:  -Group instruction provided by verbal instruction, demonstration, patient participation, and written materials to support subject.  Instructors address safety measures for doing things around the house.  Discuss how to get up and down off the floor, how to pick things up properly, how to safely get out of a chair without assistance, and balance training.   Meditation and Mindfulness:  -Group instruction provided by verbal instruction, patient participation,  and written materials to support subject.  Instructor addresses importance of mindfulness and meditation practice to help reduce stress and improve awareness.  Instructor also leads participants through a meditation exercise.    Stretching for Flexibility and Mobility:  -Group instruction provided by verbal instruction, patient participation, and written materials to support subject.  Instructors lead participants through series of stretches that are designed to increase flexibility thus improving mobility.   These stretches are additional exercise for major muscle groups that are typically performed during regular warm up and cool down.   Hands Only CPR:  -Group verbal, video, and participation provides a basic overview of AHA guidelines for community CPR. Role-play of emergencies allow participants the opportunity to practice calling for help and chest compression technique with discussion of AED use.   Hypertension: -Group verbal and written instruction that provides a basic overview of hypertension including the most recent diagnostic guidelines, risk factor reduction with self-care instructions and medication management.    Nutrition I class: Heart Healthy Eating:  -Group instruction provided by PowerPoint slides, verbal discussion, and written materials to support subject matter. The instructor gives an explanation and review of the Therapeutic Lifestyle Changes diet recommendations, which includes a discussion on lipid goals, dietary fat, sodium, fiber, plant stanol/sterol esters, sugar, and the components of a well-balanced, healthy diet.   CARDIAC REHAB PHASE II EXERCISE from 03/02/2017 in [place]  Date  02/22/17  Educator  RD  Instruction Review Code  Not applicable      Nutrition II class: Lifestyle Skills:  -Group instruction provided by PowerPoint slides, verbal discussion, and written materials to support subject matter. The instructor gives an explanation and review of label reading, grocery shopping for heart health, heart healthy recipe modifications, and ways to make healthier choices when eating out.   CARDIAC REHAB PHASE II EXERCISE from 03/02/2017 in [place]  Date  02/22/17  Educator  RD  Instruction Review Code  Not applicable      Diabetes Question & Answer:  -Group instruction provided by PowerPoint slides, verbal discussion, and written materials to support subject matter. The instructor gives an  explanation and review of diabetes co-morbidities, pre- and post-prandial blood glucose goals, pre-exercise blood glucose goals, signs, symptoms, and treatment of hypoglycemia and hyperglycemia, and foot care basics.   Diabetes Blitz:  -Group instruction provided by PowerPoint slides, verbal discussion, and written materials to support subject matter. The instructor gives an explanation and review of the physiology behind type 1 and type 2 diabetes, diabetes medications and rational behind using different medications, pre- and post-prandial blood glucose recommendations and Hemoglobin A1c goals, diabetes diet, and exercise including blood glucose guidelines for exercising safely.    Portion Distortion:  -Group instruction provided by PowerPoint slides, verbal discussion, written materials, and food models to support subject matter. The instructor gives an explanation of serving size versus portion size, changes in portions sizes over the last 20 years, and what consists of a serving from each food group.   Stress Management:  -Group instruction provided by verbal instruction, video, and written materials to support subject matter.  Instructors review role of stress in heart disease and how to cope with stress positively.     Exercising on Your Own:  -Group instruction provided by verbal instruction, power point, and written materials to support subject.  Instructors discuss benefits of exercise, components of exercise, frequency and intensity of exercise, and end points for exercise.  Also discuss  use of nitroglycerin and activating EMS.  Review options of places to exercise outside of rehab.  Review guidelines for sex with heart disease.   CARDIAC REHAB PHASE II EXERCISE from 03/02/2017 in [place]  Date  03/02/17  Instruction Review Code  2- meets goals/outcomes      Cardiac Drugs I:  -Group instruction provided by verbal instruction and written materials to  support subject.  Instructor reviews cardiac drug classes: antiplatelets, anticoagulants, beta blockers, and statins.  Instructor discusses reasons, side effects, and lifestyle considerations for each drug class.   Cardiac Drugs II:  -Group instruction provided by verbal instruction and written materials to support subject.  Instructor reviews cardiac drug classes: angiotensin converting enzyme inhibitors (ACE-I), angiotensin II receptor blockers (ARBs), nitrates, and calcium channel blockers.  Instructor discusses reasons, side effects, and lifestyle considerations for each drug class.   Anatomy and Physiology of the Circulatory System:  Group verbal and written instruction and models provide basic cardiac anatomy and physiology, with the coronary electrical and arterial systems. Review of: AMI, Angina, Valve disease, Heart Failure, Peripheral Artery Disease, Cardiac Arrhythmia, Pacemakers, and the ICD.   Other Education:  -Group or individual verbal, written, or video instructions that support the educational goals of the cardiac rehab program.   Knowledge Questionnaire Score:     Knowledge Questionnaire Score - 02/22/17 1017      Knowledge Questionnaire Score   Pre Score 20/24      Core Components/Risk Factors/Patient Goals at Admission:     Personal Goals and Risk Factors at Admission - 02/22/17 1529      Core Components/Risk Factors/Patient Goals on Admission   Hypertension Yes   Intervention Provide education on lifestyle modifcations including regular physical activity/exercise, weight management, moderate sodium restriction and increased consumption of fresh fruit, vegetables, and low fat dairy, alcohol moderation, and smoking cessation.;Monitor prescription use compliance.   Expected Outcomes Short Term: Continued assessment and intervention until BP is < 140/1m HG in hypertensive participants. < 130/835mHG in hypertensive participants with diabetes, heart failure or  chronic kidney disease.   Stress Yes   Intervention Offer individual and/or small group education and counseling on adjustment to heart disease, stress management and health-related lifestyle change. Teach and support self-help strategies.;Refer participants experiencing significant psychosocial distress to appropriate mental health specialists for further evaluation and treatment. When possible, include family members and significant others in education/counseling sessions.   Expected Outcomes Short Term: Participant demonstrates changes in health-related behavior, relaxation and other stress management skills, ability to obtain effective social support, and compliance with psychotropic medications if prescribed.;Long Term: Emotional wellbeing is indicated by absence of clinically significant psychosocial distress or social isolation.      Core Components/Risk Factors/Patient Goals Review:    Core Components/Risk Factors/Patient Goals at Discharge (Final Review):    ITP Comments:     ITP Comments    Row Name 02/22/17 1017           ITP Comments Dr. TrFransico HimMedical Director           Comments: [facility] cardiac rehab today.  Pt tolerated light exercise without difficulty. VSS, telemetry-Sinus Rhythm, asymptomatic.  Medication list reconciled. Pt denies barriers to medicaiton compliance.  PSYCHOSOCIAL ASSESSMENT:  PHQ-0. Pt exhibits positive coping skills, hopeful outlook with supportive family. No psychosocial needs identified at this time, no psychosocial interventions necessary.    Pt enjoys spending times with children and reading.   Pt oriented to exercise equipment and  routine.  Elijah is off to a good start.Barnet Pall, RN,BSN 03/02/2017 12:49 PM  Understanding verbalized.

## 2017-03-04 ENCOUNTER — Encounter (HOSPITAL_COMMUNITY)
Admission: RE | Admit: 2017-03-04 | Discharge: 2017-03-04 | Disposition: A | Discharge status: Home / Self Care | Payer: PPO | Source: Ambulatory Visit | Attending: Cardiology | Admitting: Cardiology

## 2017-03-04 DIAGNOSIS — I252 Old myocardial infarction: Secondary | ICD-10-CM | POA: Diagnosis not present

## 2017-03-04 DIAGNOSIS — I214 Non-ST elevation (NSTEMI) myocardial infarction: Secondary | ICD-10-CM

## 2017-03-04 DIAGNOSIS — Z955 Presence of coronary angioplasty implant and graft: Secondary | ICD-10-CM

## 2017-03-07 ENCOUNTER — Encounter (HOSPITAL_COMMUNITY)
Admission: RE | Admit: 2017-03-07 | Discharge: 2017-03-07 | Disposition: A | Discharge status: Home / Self Care | Payer: PPO | Source: Ambulatory Visit | Attending: Cardiology | Admitting: Cardiology

## 2017-03-07 DIAGNOSIS — I252 Old myocardial infarction: Secondary | ICD-10-CM | POA: Diagnosis not present

## 2017-03-07 DIAGNOSIS — Z955 Presence of coronary angioplasty implant and graft: Secondary | ICD-10-CM

## 2017-03-07 DIAGNOSIS — I214 Non-ST elevation (NSTEMI) myocardial infarction: Secondary | ICD-10-CM

## 2017-03-09 ENCOUNTER — Encounter (HOSPITAL_COMMUNITY)
Admission: RE | Admit: 2017-03-09 | Discharge: 2017-03-09 | Disposition: A | Discharge status: Home / Self Care | Payer: PPO | Source: Ambulatory Visit | Attending: Cardiology | Admitting: Cardiology

## 2017-03-09 DIAGNOSIS — I252 Old myocardial infarction: Secondary | ICD-10-CM | POA: Diagnosis not present

## 2017-03-09 DIAGNOSIS — I214 Non-ST elevation (NSTEMI) myocardial infarction: Secondary | ICD-10-CM

## 2017-03-09 DIAGNOSIS — Z955 Presence of coronary angioplasty implant and graft: Secondary | ICD-10-CM

## 2017-03-10 ENCOUNTER — Ambulatory Visit (HOSPITAL_COMMUNITY): Payer: PPO | Attending: Cardiology

## 2017-03-10 ENCOUNTER — Other Ambulatory Visit: Payer: Self-pay

## 2017-03-10 DIAGNOSIS — I2510 Atherosclerotic heart disease of native coronary artery without angina pectoris: Secondary | ICD-10-CM | POA: Diagnosis not present

## 2017-03-10 DIAGNOSIS — I358 Other nonrheumatic aortic valve disorders: Secondary | ICD-10-CM | POA: Insufficient documentation

## 2017-03-10 DIAGNOSIS — I4510 Unspecified right bundle-branch block: Secondary | ICD-10-CM | POA: Diagnosis not present

## 2017-03-10 DIAGNOSIS — I119 Hypertensive heart disease without heart failure: Secondary | ICD-10-CM | POA: Insufficient documentation

## 2017-03-10 DIAGNOSIS — I252 Old myocardial infarction: Secondary | ICD-10-CM | POA: Insufficient documentation

## 2017-03-10 DIAGNOSIS — I341 Nonrheumatic mitral (valve) prolapse: Secondary | ICD-10-CM

## 2017-03-11 ENCOUNTER — Telehealth: Payer: Self-pay | Admitting: Cardiology

## 2017-03-11 ENCOUNTER — Encounter (HOSPITAL_COMMUNITY)
Admission: RE | Admit: 2017-03-11 | Discharge: 2017-03-11 | Disposition: A | Discharge status: Home / Self Care | Payer: PPO | Source: Ambulatory Visit | Attending: Cardiology | Admitting: Cardiology

## 2017-03-11 DIAGNOSIS — I214 Non-ST elevation (NSTEMI) myocardial infarction: Secondary | ICD-10-CM

## 2017-03-11 DIAGNOSIS — I252 Old myocardial infarction: Secondary | ICD-10-CM | POA: Diagnosis not present

## 2017-03-11 DIAGNOSIS — Z955 Presence of coronary angioplasty implant and graft: Secondary | ICD-10-CM

## 2017-03-11 NOTE — Telephone Encounter (Signed)
°  Follow Up  Calling to follow up on echocardiogram results. Please call.

## 2017-03-11 NOTE — Telephone Encounter (Signed)
Patient informed of echo results, patient verbalizes understanding. Will route to PCP per her request.

## 2017-03-14 ENCOUNTER — Encounter (HOSPITAL_COMMUNITY)
Admission: RE | Admit: 2017-03-14 | Discharge: 2017-03-14 | Disposition: A | Discharge status: Home / Self Care | Payer: PPO | Source: Ambulatory Visit | Attending: Cardiology | Admitting: Cardiology

## 2017-03-14 DIAGNOSIS — Z955 Presence of coronary angioplasty implant and graft: Secondary | ICD-10-CM

## 2017-03-14 DIAGNOSIS — I214 Non-ST elevation (NSTEMI) myocardial infarction: Secondary | ICD-10-CM

## 2017-03-14 DIAGNOSIS — I252 Old myocardial infarction: Secondary | ICD-10-CM | POA: Diagnosis not present

## 2017-03-16 ENCOUNTER — Encounter (HOSPITAL_COMMUNITY)
Admission: RE | Admit: 2017-03-16 | Discharge: 2017-03-16 | Disposition: A | Discharge status: Home / Self Care | Payer: PPO | Source: Ambulatory Visit | Attending: Cardiology | Admitting: Cardiology

## 2017-03-16 DIAGNOSIS — I10 Essential (primary) hypertension: Secondary | ICD-10-CM | POA: Diagnosis not present

## 2017-03-16 DIAGNOSIS — Z7982 Long term (current) use of aspirin: Secondary | ICD-10-CM | POA: Diagnosis not present

## 2017-03-16 DIAGNOSIS — M810 Age-related osteoporosis without current pathological fracture: Secondary | ICD-10-CM | POA: Insufficient documentation

## 2017-03-16 DIAGNOSIS — I214 Non-ST elevation (NSTEMI) myocardial infarction: Secondary | ICD-10-CM

## 2017-03-16 DIAGNOSIS — F419 Anxiety disorder, unspecified: Secondary | ICD-10-CM | POA: Diagnosis not present

## 2017-03-16 DIAGNOSIS — I252 Old myocardial infarction: Secondary | ICD-10-CM | POA: Insufficient documentation

## 2017-03-16 DIAGNOSIS — I2510 Atherosclerotic heart disease of native coronary artery without angina pectoris: Secondary | ICD-10-CM | POA: Diagnosis not present

## 2017-03-16 DIAGNOSIS — Z955 Presence of coronary angioplasty implant and graft: Secondary | ICD-10-CM | POA: Diagnosis not present

## 2017-03-16 DIAGNOSIS — Z79899 Other long term (current) drug therapy: Secondary | ICD-10-CM | POA: Diagnosis not present

## 2017-03-16 NOTE — Progress Notes (Signed)
Reviewed home exercise program with pt.  Discussed mode/frequency/intensity of exercise, RPE scale, THRR and weather conditions for exercising outdoors.  Also discussed signs and symptoms, NTG use and when to call Dr./911.  Pt verbalized understanding.  Cleda Mccreedy, MS ACSM RCEP 03/16/17 1123

## 2017-03-18 ENCOUNTER — Encounter (HOSPITAL_COMMUNITY)
Admission: RE | Admit: 2017-03-18 | Discharge: 2017-03-18 | Disposition: A | Discharge status: Home / Self Care | Payer: PPO | Source: Ambulatory Visit | Attending: Cardiology | Admitting: Cardiology

## 2017-03-18 DIAGNOSIS — I214 Non-ST elevation (NSTEMI) myocardial infarction: Secondary | ICD-10-CM

## 2017-03-18 DIAGNOSIS — Z955 Presence of coronary angioplasty implant and graft: Secondary | ICD-10-CM

## 2017-03-18 DIAGNOSIS — I252 Old myocardial infarction: Secondary | ICD-10-CM | POA: Diagnosis not present

## 2017-03-21 ENCOUNTER — Encounter (HOSPITAL_COMMUNITY)
Admission: RE | Admit: 2017-03-21 | Discharge: 2017-03-21 | Disposition: A | Discharge status: Home / Self Care | Payer: PPO | Source: Ambulatory Visit | Attending: Cardiology | Admitting: Cardiology

## 2017-03-21 DIAGNOSIS — I252 Old myocardial infarction: Secondary | ICD-10-CM | POA: Diagnosis not present

## 2017-03-21 DIAGNOSIS — Z955 Presence of coronary angioplasty implant and graft: Secondary | ICD-10-CM

## 2017-03-21 DIAGNOSIS — I214 Non-ST elevation (NSTEMI) myocardial infarction: Secondary | ICD-10-CM

## 2017-03-21 NOTE — Progress Notes (Signed)
Hannah Vasquez 66 y.o. female       Nutrition Note  1. 01/02/17 NSTEMI (non-ST elevated myocardial infarction) ([place])   2. 01/03/17 Stented coronary artery DES LAD    Nutrition Note Spoke with pt. Nutrition plan and goals discussed with pt. Pt concerned she is not losing wt; feels she is gaining "a small amount every time I come to rehab." Pt wt today 155.8 lb (70.8 kg). Pt wt is down ~1 lb since admission. Wt loss tips reviewed. Pt states she is getting bored with the foods she's eating and wants some meal ideas. Pt is pre-diabetic according to her last A1c. Per discussion, pt was unaware of pre-diabetes. Pre-diabetes discussed. Pt encouraged to follow-up with her PCP re: pre-diabetes. Pt expressed understanding of the information reviewed. Pt aware of nutrition education classes offered and plans on attending nutrition classes.  Nutrition Diagnosis ? Food-and nutrition-related knowledge deficit related to lack of exposure to information as related to diagnosis of: ? CVD ? Pre-DM ? Overweight related to excessive energy intake as evidenced by a BMI of 28.8  Nutrition Intervention ? Pt's individual nutrition plan reviewed with pt. ? Handouts given for: 1200 kcal, 5 day menu ideas; on-line resources for menu ideas  Nutrition Goal(s):  ? Pt to identify food quantities necessary to achieve weight loss of 6-24 lb (2.7-10.9 kg) at graduation from cardiac rehab. - working toward goal; wt down 1 lb since admission ? Pt to continue working toward incorporating heart healthy foods/making heart healthy lifestyle choices in her diet - meeting at this time Plan:  Pt to attend nutrition classes ? Nutrition I ? Nutrition II ? Portion Distortion  Will provide client-centered nutrition education as part of interdisciplinary care.   Monitor and evaluate progress toward nutrition goal with team.  Derek Mound, M.Ed, RD, LDN, CDE 03/21/2017 11:34 AM

## 2017-03-23 ENCOUNTER — Encounter (HOSPITAL_COMMUNITY)
Admission: RE | Admit: 2017-03-23 | Discharge: 2017-03-23 | Disposition: A | Discharge status: Home / Self Care | Payer: PPO | Source: Ambulatory Visit | Attending: Cardiology | Admitting: Cardiology

## 2017-03-23 DIAGNOSIS — Z955 Presence of coronary angioplasty implant and graft: Secondary | ICD-10-CM

## 2017-03-23 DIAGNOSIS — I252 Old myocardial infarction: Secondary | ICD-10-CM | POA: Diagnosis not present

## 2017-03-23 DIAGNOSIS — I214 Non-ST elevation (NSTEMI) myocardial infarction: Secondary | ICD-10-CM

## 2017-03-25 ENCOUNTER — Other Ambulatory Visit: Payer: Self-pay | Admitting: Family Medicine

## 2017-03-25 ENCOUNTER — Encounter (HOSPITAL_COMMUNITY)
Admission: RE | Admit: 2017-03-25 | Discharge: 2017-03-25 | Disposition: A | Discharge status: Home / Self Care | Payer: PPO | Source: Ambulatory Visit | Attending: Cardiology | Admitting: Cardiology

## 2017-03-25 DIAGNOSIS — Z955 Presence of coronary angioplasty implant and graft: Secondary | ICD-10-CM

## 2017-03-25 DIAGNOSIS — I214 Non-ST elevation (NSTEMI) myocardial infarction: Secondary | ICD-10-CM

## 2017-03-25 DIAGNOSIS — I252 Old myocardial infarction: Secondary | ICD-10-CM | POA: Diagnosis not present

## 2017-03-25 NOTE — Telephone Encounter (Signed)
Yes okay to refill one time only.

## 2017-03-25 NOTE — Telephone Encounter (Signed)
Last Rx 02/22/2017. Last OV 12/2016. pls route back to correct CMA

## 2017-03-28 ENCOUNTER — Encounter (HOSPITAL_COMMUNITY)
Admission: RE | Admit: 2017-03-28 | Discharge: 2017-03-28 | Disposition: A | Discharge status: Home / Self Care | Payer: PPO | Source: Ambulatory Visit | Attending: Cardiology | Admitting: Cardiology

## 2017-03-28 DIAGNOSIS — I214 Non-ST elevation (NSTEMI) myocardial infarction: Secondary | ICD-10-CM

## 2017-03-28 DIAGNOSIS — Z955 Presence of coronary angioplasty implant and graft: Secondary | ICD-10-CM

## 2017-03-28 DIAGNOSIS — I252 Old myocardial infarction: Secondary | ICD-10-CM | POA: Diagnosis not present

## 2017-03-28 NOTE — Telephone Encounter (Signed)
Pt has called back asking about the refill.

## 2017-03-28 NOTE — Telephone Encounter (Signed)
Rx called to pharmacy as instructed. 

## 2017-03-30 ENCOUNTER — Encounter (HOSPITAL_COMMUNITY)
Admission: RE | Admit: 2017-03-30 | Discharge: 2017-03-30 | Disposition: A | Discharge status: Home / Self Care | Payer: PPO | Source: Ambulatory Visit | Attending: Cardiology | Admitting: Cardiology

## 2017-03-30 DIAGNOSIS — Z955 Presence of coronary angioplasty implant and graft: Secondary | ICD-10-CM

## 2017-03-30 DIAGNOSIS — I252 Old myocardial infarction: Secondary | ICD-10-CM | POA: Diagnosis not present

## 2017-03-30 DIAGNOSIS — I214 Non-ST elevation (NSTEMI) myocardial infarction: Secondary | ICD-10-CM

## 2017-03-30 NOTE — Progress Notes (Signed)
Cardiac Individual Treatment Plan  Patient Details  Name: Hannah Vasquez MRN: [id number] Date of Birth: [birth year] Referring Provider:     [place] from 02/22/2017 in [place]  Referring Provider  Candee Furbish MD      Initial Encounter Date:    [place] from 02/22/2017 in [place]  Date  02/22/17  Referring Provider  Candee Furbish MD      Visit Diagnosis: 01/02/17 NSTEMI (non-ST elevated myocardial infarction) ([place])  01/03/17 Stented coronary artery DES LAD  Patient's Home Medications on Admission:  Current Outpatient Prescriptions:  .  albuterol (PROVENTIL HFA;VENTOLIN HFA) 108 (90 Base) MCG/ACT inhaler, Inhale 1 puff into the lungs every 6 (six) hours as needed for wheezing or shortness of breath., Disp: , Rfl:  .  albuterol (PROVENTIL) (2.5 MG/3ML) 0.083% nebulizer solution, Take 2.5 mg by nebulization every 6 (six) hours as needed for wheezing or shortness of breath., Disp: , Rfl:  .  aspirin EC 81 MG tablet, Take 81 mg by mouth every morning. , Disp: , Rfl:  .  atorvastatin (LIPITOR) 80 MG tablet, Take 1 tablet (80 mg total) by mouth daily at 6 PM., Disp: 30 tablet, Rfl: 3 .  Cholecalciferol (VITAMIN D3) 1000 UNITS CAPS, Take 1 capsule by mouth every morning. , Disp: , Rfl:  .  furosemide (LASIX) 20 MG tablet, Take 1 tablet (20 mg total) by mouth as needed., Disp: 30 tablet, Rfl: 3 .  isosorbide mononitrate (IMDUR) 30 MG 24 hr tablet, Take 1 tablet (30 mg total) by mouth daily., Disp: 90 tablet, Rfl: 3 .  loratadine (CLARITIN) 10 MG tablet, Take 1 tablet (10 mg total) by mouth every morning., Disp: 30 tablet, Rfl: 2 .  losartan (COZAAR) 50 MG tablet, Take 1 tablet (50 mg total) by mouth daily., Disp: 90 tablet, Rfl: 3 .  LYRICA 75 MG capsule, TAKE 1 CAPSULE BY MOUTH TWICE DAILY, Disp: 60 capsule, Rfl: 0 .  metoprolol succinate (TOPROL-XL) 50 MG 24 hr tablet, Take 1  tablet (50 mg total) by mouth daily. Take with or immediately following a meal., Disp: 90 tablet, Rfl: 3 .  Multiple Vitamin (MULTIVITAMIN) tablet, Take 1 tablet by mouth every morning. , Disp: , Rfl:  .  nitroGLYCERIN (NITROSTAT) 0.4 MG SL tablet, Place 1 tablet (0.4 mg total) under the tongue every 5 (five) minutes as needed for chest pain., Disp: 25 tablet, Rfl: 3 .  Omega-3 Fatty Acids (FISH OIL) 1200 MG CAPS, Take 1 capsule by mouth every morning. , Disp: , Rfl:  .  omeprazole (PRILOSEC) 40 MG capsule, Take 40 mg by mouth daily. , Disp: , Rfl:  .  potassium chloride SA (K-DUR,KLOR-CON) 10 MEQ tablet, Take 1 tablet (10 mEq total) by mouth daily., Disp: 30 tablet, Rfl: 3 .  prasugrel (EFFIENT) 10 MG TABS tablet, Take 10 mg by mouth daily., Disp: , Rfl:   Past Medical History: Past Medical History:  Diagnosis Date  . Allergy   . Anxiety   . CAD (coronary artery disease)   . Cataract   . Hemorrhoids   . Hypertension   . Mitral valve prolapse   . Osteoporosis   . Status post dilation of esophageal narrowing     Tobacco Use: History  Smoking Status  . Never Smoker  Smokeless Tobacco  . Never Used    Labs: Recent Review Flowsheet Data    Labs for ITP Cardiac and  Pulmonary Rehab Latest Ref Rng & Units 11/27/2014 09/16/2015 09/20/2016 01/02/2017 02/28/2017   Cholestrol 100 - 199 mg/dL 168 178 147 144 102   LDLCALC 0 - 99 mg/dL 102(H) 109(H) 84 82 44   LDLDIRECT mg/dL - - - - -   HDL >39 mg/dL 27.90(L) 31.50(L) 26.30(L) 23(L) 27(L)   Trlycerides 0 - 149 mg/dL 189.0(H) 188.0(H) 184.0(H) 193(H) 156(H)   Hemoglobin A1c 4.8 - 5.6 % - - - 6.1(H) -      Capillary Blood Glucose: No results found for: GLUCAP   Exercise Target Goals:    Exercise Program Goal: Individual exercise prescription set with THRR, safety & activity barriers. Participant demonstrates ability to understand and report RPE using BORG scale, to self-measure pulse accurately, and to acknowledge the importance of the  exercise prescription.  Exercise Prescription Goal: Starting with aerobic activity 30 plus minutes a day, 3 days per week for initial exercise prescription. Provide home exercise prescription and guidelines that participant acknowledges understanding prior to discharge.  Activity Barriers & Risk Stratification:     Activity Barriers & Cardiac Risk Stratification - 02/22/17 0944      Activity Barriers & Cardiac Risk Stratification   Activity Barriers Other (comment);Joint Problems;Deconditioning;Back Problems;Muscular Weakness   Comments B knee scope ~10years ago, mid to low back and hip/gluteal pain, R shoulder surgery in 2006   Cardiac Risk Stratification High      6 Minute Walk:     6 Minute Walk    Row Name 02/22/17 1437         6 Minute Walk   Phase Initial     Distance 1403 feet     Walk Time 6 minutes     # of Rest Breaks 0     MPH 2.66     METS 2.8     RPE 12     VO2 Peak 9.81     Symptoms Yes (comment)     Comments c/o lightheadedness     Resting HR 58 bpm     Resting BP 144/64     Max Ex. HR 81 bpm     Max Ex. BP 124/74     2 Minute Post BP 122/72        Oxygen Initial Assessment:   Oxygen Re-Evaluation:   Oxygen Discharge (Final Oxygen Re-Evaluation):   Initial Exercise Prescription:     Initial Exercise Prescription - 02/22/17 1400      Date of Initial Exercise RX and Referring Provider   Date 02/22/17   Referring Provider Candee Furbish MD     Bike   Level 0.5   Minutes 10   METs 2.35     NuStep   Level 3   SPM 80   Minutes 10   METs 2     Track   Laps 10   Minutes 10   METs 2.74     Prescription Details   Frequency (times per week) 3   Duration Progress to 30 minutes of continuous aerobic without signs/symptoms of physical distress     Intensity   THRR 40-80% of Max Heartrate 61-122   Ratings of Perceived Exertion 11-15   Perceived Dyspnea 0-4     Progression   Progression Continue to progress workloads to maintain  intensity without signs/symptoms of physical distress.     Resistance Training   Training Prescription Yes   Weight 2lbs   Reps 10-15      Perform Capillary Blood Glucose checks as needed.  Exercise Prescription Changes:     Exercise Prescription Changes    Row Name 03/25/17 1400             Response to Exercise   Blood Pressure (Admit) 116/82       Blood Pressure (Exercise) 140/74       Blood Pressure (Exit) 120/70       Heart Rate (Admit) 75 bpm       Heart Rate (Exercise) 96 bpm       Heart Rate (Exit) 70 bpm       Rating of Perceived Exertion (Exercise) 13       Duration Progress to 45 minutes of aerobic exercise without signs/symptoms of physical distress       Intensity THRR unchanged         Progression   Progression Continue to progress workloads to maintain intensity without signs/symptoms of physical distress.       Average METs 2.9         Resistance Training   Training Prescription Yes       Weight 3lb       Reps 10-15         Bike   Level -       Minutes -       METs -         Recumbant Bike   Level 3       Minutes 10       METs 2.5         NuStep   Level 3       SPM 80       Minutes 10       METs 2.9         Track   Laps 13       Minutes 10       METs 3.26         Home Exercise Plan   Plans to continue exercise at Home (comment)       Frequency Add 3 additional days to program exercise sessions.          Exercise Comments:     Exercise Comments    Row Name 03/25/17 1403           Exercise Comments Reviewed goals with pt.           Exercise Goals and Review:     Exercise Goals    Row Name 02/22/17 0948             Exercise Goals   Increase Physical Activity Yes       Intervention Provide advice, education, support and counseling about physical activity/exercise needs.;Develop an individualized exercise prescription for aerobic and resistive training based on initial evaluation findings, risk stratification,  comorbidities and participant's personal goals.       Expected Outcomes Achievement of increased cardiorespiratory fitness and enhanced flexibility, muscular endurance and strength shown through measurements of functional capacity and personal statement of participant.       Increase Strength and Stamina Yes  increase walking tolerance and be able to keep up with grandkids and their activities. Learn activity limitations       Intervention Provide advice, education, support and counseling about physical activity/exercise needs.;Develop an individualized exercise prescription for aerobic and resistive training based on initial evaluation findings, risk stratification, comorbidities and participant's personal goals.       Expected Outcomes Achievement of increased cardiorespiratory fitness and enhanced flexibility, muscular endurance and strength shown  through measurements of functional capacity and personal statement of participant.          Exercise Goals Re-Evaluation :     Exercise Goals Re-Evaluation    Row Name 03/25/17 1402             Exercise Goal Re-Evaluation   Exercise Goals Review Increase Physical Activity;Increase Strenth and Stamina       Comments Pt is doing great with exercise and tolerating workload increases well.         Expected Outcomes Continue with exercise Rx and gradually increase workloads in order to increase strength and stamina           Discharge Exercise Prescription (Final Exercise Prescription Changes):     Exercise Prescription Changes - 03/25/17 1400      Response to Exercise   Blood Pressure (Admit) 116/82   Blood Pressure (Exercise) 140/74   Blood Pressure (Exit) 120/70   Heart Rate (Admit) 75 bpm   Heart Rate (Exercise) 96 bpm   Heart Rate (Exit) 70 bpm   Rating of Perceived Exertion (Exercise) 13   Duration Progress to 45 minutes of aerobic exercise without signs/symptoms of physical distress   Intensity THRR unchanged     Progression    Progression Continue to progress workloads to maintain intensity without signs/symptoms of physical distress.   Average METs 2.9     Resistance Training   Training Prescription Yes   Weight 3lb   Reps 10-15     Bike   Level --   Minutes --   METs --     Recumbant Bike   Level 3   Minutes 10   METs 2.5     NuStep   Level 3   SPM 80   Minutes 10   METs 2.9     Track   Laps 13   Minutes 10   METs 3.26     Home Exercise Plan   Plans to continue exercise at Home (comment)   Frequency Add 3 additional days to program exercise sessions.      Nutrition:  Target Goals: Understanding of nutrition guidelines, daily intake of sodium <1536m, cholesterol <2015m calories 30% from fat and 7% or less from saturated fats, daily to have 5 or more servings of fruits and vegetables.  Biometrics:     Pre Biometrics - 02/22/17 1534      Pre Biometrics   Height 5' 2"  (1.575 m)   Weight 157 lb 3 oz (71.3 kg)   Waist Circumference 39 inches   Hip Circumference 42.5 inches   Waist to Hip Ratio 0.92 %   BMI (Calculated) 28.8   Triceps Skinfold 31 mm   % Body Fat 31.2 %   Grip Strength 28 kg   Flexibility 12 in   Single Leg Stand 22 seconds       Nutrition Therapy Plan and Nutrition Goals:     Nutrition Therapy & Goals - 02/22/17 1340      Nutrition Therapy   Diet Therapeutic Lifestyle Changes     Personal Nutrition Goals   Nutrition Goal Pt to identify food quantities necessary to achieve weight loss of 6-24 lb (2.7-10.9 kg) at graduation from cardiac rehab.    Personal Goal #2 Pt to continue working toward incorporating heart healthy foods/making heart healthy lifestyle choices in her diet      Intervention Plan   Intervention Prescribe, educate and counsel regarding individualized specific dietary modifications aiming towards targeted core components such as  weight, hypertension, lipid management, diabetes, heart failure and other comorbidities.   Expected Outcomes  Short Term Goal: Understand basic principles of dietary content, such as calories, fat, sodium, cholesterol and nutrients.;Long Term Goal: Adherence to prescribed nutrition plan.      Nutrition Discharge: Nutrition Scores:     Nutrition Assessments - 02/22/17 1340      MEDFICTS Scores   Pre Score 7      Nutrition Goals Re-Evaluation:     Nutrition Goals Re-Evaluation    Row Name 03/21/17 1140             Goals   Current Weight 156 lb 1.4 oz (70.8 kg)       Nutrition Goal Pt to identify food quantities necessary to achieve weight loss of 6-24 lb (2.7-10.9 kg) at graduation from cardiac rehab.        Comment Pt wt is down 1 lb since admission. Continue working toward wt loss goal. Pt is continuing to work toward incorporating heart healthy foods/making heart healthy diet choices.   Handouts for 5 day menu ideas and on-line heart healthy recipe resources given         Personal Goal #2 Re-Evaluation   Personal Goal #2 Pt to continue working toward incorporating heart healthy foods/making heart healthy lifestyle choices in her diet           Nutrition Goals Re-Evaluation:     Nutrition Goals Re-Evaluation    [place] Name 03/21/17 1140             Goals   Current Weight 156 lb 1.4 oz (70.8 kg)       Nutrition Goal Pt to identify food quantities necessary to achieve weight loss of 6-24 lb (2.7-10.9 kg) at graduation from cardiac rehab.        Comment Pt wt is down 1 lb since admission. Continue working toward wt loss goal. Pt is continuing to work toward incorporating heart healthy foods/making heart healthy diet choices.   Handouts for 5 day menu ideas and on-line heart healthy recipe resources given         Personal Goal #2 Re-Evaluation   Personal Goal #2 Pt to continue working toward incorporating heart healthy foods/making heart healthy lifestyle choices in her diet           Nutrition Goals Discharge (Final Nutrition Goals Re-Evaluation):     Nutrition Goals  Re-Evaluation - 03/21/17 1140      Goals   Current Weight 156 lb 1.4 oz (70.8 kg)   Nutrition Goal Pt to identify food quantities necessary to achieve weight loss of 6-24 lb (2.7-10.9 kg) at graduation from cardiac rehab.    Comment Pt wt is down 1 lb since admission. Continue working toward wt loss goal. Pt is continuing to work toward incorporating heart healthy foods/making heart healthy diet choices.   Handouts for 5 day menu ideas and on-line heart healthy recipe resources given     Personal Goal #2 Re-Evaluation   Personal Goal #2 Pt to continue working toward incorporating heart healthy foods/making heart healthy lifestyle choices in her diet       Psychosocial: Target Goals: Acknowledge presence or absence of significant depression and/or stress, maximize coping skills, provide positive support system. Participant is able to verbalize types and ability to use techniques and skills needed for reducing stress and depression.  Initial Review & Psychosocial Screening:     Initial Psych Review & Screening - 02/22/17 1018      Initial  Review   Current issues with None Identified     Family Dynamics   Good Support System? Yes  daughter and granddaughter    Comments upon brief assessment, no psychosocial needs identified, no interventions necessary      Barriers   Psychosocial barriers to participate in program There are no identifiable barriers or psychosocial needs.     Screening Interventions   Interventions Encouraged to exercise;Provide feedback about the scores to participant      Quality of Life Scores:     Quality of Life - 02/22/17 1019      Quality of Life Scores   Health/Function Pre 24.4 %   Socioeconomic Pre 30 %   Psych/Spiritual Pre 30 %   Family Pre 28.8 %   GLOBAL Pre 27.4 %      PHQ-9: Recent Review Flowsheet Data    Depression screen [facility] 2/9 03/02/2017 09/20/2016 09/16/2015 11/27/2014 11/27/2014   Decreased Interest 0 0 0 0 0   Down, Depressed,  Hopeless 0 0 0 0 0   PHQ - 2 Score 0 0 0 0 0     Interpretation of Total Score  Total Score Depression Severity:  1-4 = Minimal depression, 5-9 = Mild depression, 10-14 = Moderate depression, 15-19 = Moderately severe depression, 20-27 = Severe depression   Psychosocial Evaluation and Intervention:   Psychosocial Re-Evaluation:     Psychosocial Re-Evaluation    Row Name 03/30/17 8119             Psychosocial Re-Evaluation   Current issues with Current Stress Concerns       Interventions Stress management education;Encouraged to attend Cardiac Rehabilitation for the exercise       Continue Psychosocial Services  No Follow up required         Initial Review   Source of Stress Concerns None Identified          Psychosocial Discharge (Final Psychosocial Re-Evaluation):     Psychosocial Re-Evaluation - 03/30/17 1838      Psychosocial Re-Evaluation   Current issues with Current Stress Concerns   Interventions Stress management education;Encouraged to attend Cardiac Rehabilitation for the exercise   Continue Psychosocial Services  No Follow up required     Initial Review   Source of Stress Concerns None Identified      Vocational Rehabilitation: Provide vocational rehab assistance to qualifying candidates.   Vocational Rehab Evaluation & Intervention:     Vocational Rehab - 03/02/17 1247      Initial Vocational Rehab Evaluation & Intervention   Assessment shows need for Vocational Rehabilitation No     Vocational Rehab Re-Evaulation   Comments --  Felisa is retired and does not need vocational rehab at this time.      Education: Education Goals: Education classes will be provided on a weekly basis, covering required topics. Participant will state understanding/return demonstration of topics presented.  Learning Barriers/Preferences:     Learning Barriers/Preferences - 02/22/17 0943      Learning Barriers/Preferences   Learning Barriers Sight  cataract  surgery      Education Topics: Count Your Pulse:  -Group instruction provided by verbal instruction, demonstration, patient participation and written materials to support subject.  Instructors address importance of being able to find your pulse and how to count your pulse when at home without a heart monitor.  Patients get hands on experience counting their pulse with staff help and individually.   Heart Attack, Angina, and Risk Factor Modification:  -Group instruction provided  by verbal instruction, video, and written materials to support subject.  Instructors address signs and symptoms of angina and heart attacks.    Also discuss risk factors for heart disease and how to make changes to improve heart health risk factors.   CARDIAC REHAB PHASE II EXERCISE from 03/23/2017 in [place]  Date  03/09/17  Instruction Review Code  2- meets goals/outcomes      Functional Fitness:  -Group instruction provided by verbal instruction, demonstration, patient participation, and written materials to support subject.  Instructors address safety measures for doing things around the house.  Discuss how to get up and down off the floor, how to pick things up properly, how to safely get out of a chair without assistance, and balance training.   CARDIAC REHAB PHASE II EXERCISE from 03/23/2017 in [place]  Date  03/04/17  Instruction Review Code  2- meets goals/outcomes      Meditation and Mindfulness:  -Group instruction provided by verbal instruction, patient participation, and written materials to support subject.  Instructor addresses importance of mindfulness and meditation practice to help reduce stress and improve awareness.  Instructor also leads participants through a meditation exercise.    Stretching for Flexibility and Mobility:  -Group instruction provided by verbal instruction, patient participation, and written materials to  support subject.  Instructors lead participants through series of stretches that are designed to increase flexibility thus improving mobility.  These stretches are additional exercise for major muscle groups that are typically performed during regular warm up and cool down.   Hands Only CPR:  -Group verbal, video, and participation provides a basic overview of AHA guidelines for community CPR. Role-play of emergencies allow participants the opportunity to practice calling for help and chest compression technique with discussion of AED use.   Hypertension: -Group verbal and written instruction that provides a basic overview of hypertension including the most recent diagnostic guidelines, risk factor reduction with self-care instructions and medication management.    Nutrition I class: Heart Healthy Eating:  -Group instruction provided by PowerPoint slides, verbal discussion, and written materials to support subject matter. The instructor gives an explanation and review of the Therapeutic Lifestyle Changes diet recommendations, which includes a discussion on lipid goals, dietary fat, sodium, fiber, plant stanol/sterol esters, sugar, and the components of a well-balanced, healthy diet.   CARDIAC REHAB PHASE II EXERCISE from 03/23/2017 in [place]  Date  03/22/17  Educator  RD  Instruction Review Code  2- meets goals/outcomes      Nutrition II class: Lifestyle Skills:  -Group instruction provided by PowerPoint slides, verbal discussion, and written materials to support subject matter. The instructor gives an explanation and review of label reading, grocery shopping for heart health, heart healthy recipe modifications, and ways to make healthier choices when eating out.   CARDIAC REHAB PHASE II EXERCISE from 03/23/2017 in [place]  Date  02/22/17  Educator  RD  Instruction Review Code  Not applicable      Diabetes Question &  Answer:  -Group instruction provided by PowerPoint slides, verbal discussion, and written materials to support subject matter. The instructor gives an explanation and review of diabetes co-morbidities, pre- and post-prandial blood glucose goals, pre-exercise blood glucose goals, signs, symptoms, and treatment of hypoglycemia and hyperglycemia, and foot care basics.   CARDIAC REHAB PHASE II EXERCISE from 03/23/2017 in [place]  Date  03/18/17  Educator  RD  Instruction Review Code  2- meets goals/outcomes      Diabetes Blitz:  -Group instruction provided by PowerPoint slides, verbal discussion, and written materials to support subject matter. The instructor gives an explanation and review of the physiology behind type 1 and type 2 diabetes, diabetes medications and rational behind using different medications, pre- and post-prandial blood glucose recommendations and Hemoglobin A1c goals, diabetes diet, and exercise including blood glucose guidelines for exercising safely.    Portion Distortion:  -Group instruction provided by PowerPoint slides, verbal discussion, written materials, and food models to support subject matter. The instructor gives an explanation of serving size versus portion size, changes in portions sizes over the last 20 years, and what consists of a serving from each food group.   Stress Management:  -Group instruction provided by verbal instruction, video, and written materials to support subject matter.  Instructors review role of stress in heart disease and how to cope with stress positively.     Exercising on Your Own:  -Group instruction provided by verbal instruction, power point, and written materials to support subject.  Instructors discuss benefits of exercise, components of exercise, frequency and intensity of exercise, and end points for exercise.  Also discuss use of nitroglycerin and activating EMS.  Review options of places to  exercise outside of rehab.  Review guidelines for sex with heart disease.   CARDIAC REHAB PHASE II EXERCISE from 03/23/2017 in [place]  Date  03/02/17  Instruction Review Code  2- meets goals/outcomes      Cardiac Drugs I:  -Group instruction provided by verbal instruction and written materials to support subject.  Instructor reviews cardiac drug classes: antiplatelets, anticoagulants, beta blockers, and statins.  Instructor discusses reasons, side effects, and lifestyle considerations for each drug class.   CARDIAC REHAB PHASE II EXERCISE from 03/23/2017 in [place]  Date  03/23/17  Instruction Review Code  2- meets goals/outcomes      Cardiac Drugs II:  -Group instruction provided by verbal instruction and written materials to support subject.  Instructor reviews cardiac drug classes: angiotensin converting enzyme inhibitors (ACE-I), angiotensin II receptor blockers (ARBs), nitrates, and calcium channel blockers.  Instructor discusses reasons, side effects, and lifestyle considerations for each drug class.   Anatomy and Physiology of the Circulatory System:  Group verbal and written instruction and models provide basic cardiac anatomy and physiology, with the coronary electrical and arterial systems. Review of: AMI, Angina, Valve disease, Heart Failure, Peripheral Artery Disease, Cardiac Arrhythmia, Pacemakers, and the ICD.   CARDIAC REHAB PHASE II EXERCISE from 03/23/2017 in [place]  Date  03/16/17  Educator  RN  Instruction Review Code  2- meets goals/outcomes      Other Education:  -Group or individual verbal, written, or video instructions that support the educational goals of the cardiac rehab program.   Knowledge Questionnaire Score:     Knowledge Questionnaire Score - 02/22/17 1017      Knowledge Questionnaire Score   Pre Score 20/24      Core Components/Risk  Factors/Patient Goals at Admission:     Personal Goals and Risk Factors at Admission - 02/22/17 1529      Core Components/Risk Factors/Patient Goals on Admission   Hypertension Yes   Intervention Provide education on lifestyle modifcations including regular physical activity/exercise, weight management, moderate sodium restriction and increased consumption of fresh fruit, vegetables, and low fat dairy, alcohol  moderation, and smoking cessation.;Monitor prescription use compliance.   Expected Outcomes Short Term: Continued assessment and intervention until BP is < 140/15m HG in hypertensive participants. < 130/869mHG in hypertensive participants with diabetes, heart failure or chronic kidney disease.   Stress Yes   Intervention Offer individual and/or small group education and counseling on adjustment to heart disease, stress management and health-related lifestyle change. Teach and support self-help strategies.;Refer participants experiencing significant psychosocial distress to appropriate mental health specialists for further evaluation and treatment. When possible, include family members and significant others in education/counseling sessions.   Expected Outcomes Short Term: Participant demonstrates changes in health-related behavior, relaxation and other stress management skills, ability to obtain effective social support, and compliance with psychotropic medications if prescribed.;Long Term: Emotional wellbeing is indicated by absence of clinically significant psychosocial distress or social isolation.      Core Components/Risk Factors/Patient Goals Review:    Core Components/Risk Factors/Patient Goals at Discharge (Final Review):    ITP Comments:     ITP Comments    Row Name 02/22/17 1017           ITP Comments Dr. TrFransico HimMedical Director           Comments: LiKerons making expected progress toward personal goals after completing 14 sessions. Recommend continued  exercise and life style modification education including  stress management and relaxation techniques to decrease cardiac risk profile. MaBarnet PallRN,BSN 03/30/2017 6:40 PM

## 2017-03-31 ENCOUNTER — Telehealth: Payer: Self-pay | Admitting: Cardiology

## 2017-03-31 ENCOUNTER — Telehealth: Payer: Self-pay | Admitting: Family Medicine

## 2017-03-31 NOTE — Telephone Encounter (Signed)
I was unable to reach pt by phone but I did speak with Sandy(DPR signed) and she would call me back. Sandy did cb and she finally reached pt and pt will cb to speak with me. Pt called back and pt said has not had diarrhea since early this morning; the nausea is better and if symptoms worsen she would rather go to UC instead of another [facility] office. FYI to Dr Deborra Medina.

## 2017-03-31 NOTE — Telephone Encounter (Signed)
Patient Name: Hannah Vasquez DOB: 1950/12/03 Initial Comment Caller states she has fatigue, diarrhea and nausea. Nurse Assessment Nurse: Jimmye Norman, RN, Whitney Date/Time (Eastern Time): 03/31/2017 9:19:42 AM Confirm and document reason for call. If symptomatic, describe symptoms. ---Caller states she has fatigue, diarrhea throughout the night x1. This morning she had nausea and vomiting x1. states she has been tired all week, is in cardio-rehab and blood pressure and everything had seemed normal. unknown temperature- does not feel like she has a fever. still feels nauseated. Does the patient have any new or worsening symptoms? ---Yes Will a triage be completed? ---Yes Related visit to physician within the last 2 weeks? ---No Does the PT have any chronic conditions? (i.e. diabetes, asthma, etc.) ---Yes List chronic conditions. ---history of heart attack (may 2018), hypertension, GERD, allergies, high cholesterol Is this a behavioral health or substance abuse call? ---No Guidelines Guideline Title Affirmed Question Affirmed Notes Vomiting MILD vomiting with diarrhea Final Disposition User See Physician within 4 Hours (or PCP triage) Jimmye Norman, RN, Whitney Comments No appt available with PCP or at Primary office. Referrals GO TO FACILITY REFUSED Disagree/Comply: Disagree Disagree/Comply Reason: Wait and see

## 2017-03-31 NOTE — Telephone Encounter (Signed)
Patient c/o feeling bad all week and said that early this morning she had nausea, diarrhea x's 2, felt sick on her stomach and dry heaving with with some yellow liquid vomit. Patient said she didn't sleep well last night. No diarrhea since 6:00 am today. Patient said her BP has been fine during Cardiac Rehab this week.   No c/o fever, chills, chest pain, jaw pain, sob or dizziness.  Patient said she rested most of day and feel somewhat better. Patient said she called her PCP this morning and they didn't have any openings to see her.   Patient instructed to contact her PCP again for an evaluation and if she continues to have diarrhea, n/v that she should not go to Cardiac Rehab in the morning but if she continued to feel better that she should go to Cardiac Rehab tomorrow.  Patient verbalized understanding of plan.

## 2017-03-31 NOTE — Telephone Encounter (Signed)
New message       Patient c/o fatigue, headache, nausea (vomit once today) and diarrhea x2 today.  She is scheduled to go to cardiac rehab in the morning.  Calling to talk to a nurse to see if this is heart related and if she should go to cardiac rehab tomorrow.

## 2017-04-01 ENCOUNTER — Ambulatory Visit: Payer: PPO | Admitting: Family Medicine

## 2017-04-01 ENCOUNTER — Encounter (HOSPITAL_COMMUNITY)
Admission: RE | Admit: 2017-04-01 | Discharge: 2017-04-01 | Disposition: A | Discharge status: Home / Self Care | Payer: PPO | Source: Ambulatory Visit | Attending: Cardiology | Admitting: Cardiology

## 2017-04-01 DIAGNOSIS — I214 Non-ST elevation (NSTEMI) myocardial infarction: Secondary | ICD-10-CM

## 2017-04-01 DIAGNOSIS — I252 Old myocardial infarction: Secondary | ICD-10-CM | POA: Diagnosis not present

## 2017-04-01 DIAGNOSIS — Z955 Presence of coronary angioplasty implant and graft: Secondary | ICD-10-CM

## 2017-04-04 ENCOUNTER — Encounter (HOSPITAL_COMMUNITY)
Admission: RE | Admit: 2017-04-04 | Discharge: 2017-04-04 | Disposition: A | Discharge status: Home / Self Care | Payer: PPO | Source: Ambulatory Visit | Attending: Cardiology | Admitting: Cardiology

## 2017-04-04 DIAGNOSIS — I252 Old myocardial infarction: Secondary | ICD-10-CM | POA: Diagnosis not present

## 2017-04-04 DIAGNOSIS — Z955 Presence of coronary angioplasty implant and graft: Secondary | ICD-10-CM

## 2017-04-04 DIAGNOSIS — I214 Non-ST elevation (NSTEMI) myocardial infarction: Secondary | ICD-10-CM

## 2017-04-06 ENCOUNTER — Encounter (HOSPITAL_COMMUNITY)
Admission: RE | Admit: 2017-04-06 | Discharge: 2017-04-06 | Disposition: A | Discharge status: Home / Self Care | Payer: PPO | Source: Ambulatory Visit | Attending: Cardiology | Admitting: Cardiology

## 2017-04-06 DIAGNOSIS — I252 Old myocardial infarction: Secondary | ICD-10-CM | POA: Diagnosis not present

## 2017-04-06 DIAGNOSIS — I214 Non-ST elevation (NSTEMI) myocardial infarction: Secondary | ICD-10-CM

## 2017-04-06 DIAGNOSIS — Z955 Presence of coronary angioplasty implant and graft: Secondary | ICD-10-CM

## 2017-04-08 ENCOUNTER — Encounter (HOSPITAL_COMMUNITY)
Admission: RE | Admit: 2017-04-08 | Discharge: 2017-04-08 | Disposition: A | Discharge status: Home / Self Care | Payer: PPO | Source: Ambulatory Visit | Attending: Cardiology | Admitting: Cardiology

## 2017-04-08 DIAGNOSIS — Z955 Presence of coronary angioplasty implant and graft: Secondary | ICD-10-CM

## 2017-04-08 DIAGNOSIS — I214 Non-ST elevation (NSTEMI) myocardial infarction: Secondary | ICD-10-CM

## 2017-04-08 DIAGNOSIS — I252 Old myocardial infarction: Secondary | ICD-10-CM | POA: Diagnosis not present

## 2017-04-11 ENCOUNTER — Encounter (HOSPITAL_COMMUNITY)
Admission: RE | Admit: 2017-04-11 | Discharge: 2017-04-11 | Disposition: A | Discharge status: Home / Self Care | Payer: PPO | Source: Ambulatory Visit | Attending: Cardiology | Admitting: Cardiology

## 2017-04-11 DIAGNOSIS — Z955 Presence of coronary angioplasty implant and graft: Secondary | ICD-10-CM

## 2017-04-11 DIAGNOSIS — I252 Old myocardial infarction: Secondary | ICD-10-CM | POA: Diagnosis not present

## 2017-04-11 DIAGNOSIS — I214 Non-ST elevation (NSTEMI) myocardial infarction: Secondary | ICD-10-CM

## 2017-04-13 ENCOUNTER — Encounter (HOSPITAL_COMMUNITY)
Admission: RE | Admit: 2017-04-13 | Discharge: 2017-04-13 | Disposition: A | Discharge status: Home / Self Care | Payer: PPO | Source: Ambulatory Visit | Attending: Cardiology | Admitting: Cardiology

## 2017-04-13 DIAGNOSIS — Z955 Presence of coronary angioplasty implant and graft: Secondary | ICD-10-CM

## 2017-04-13 DIAGNOSIS — I252 Old myocardial infarction: Secondary | ICD-10-CM | POA: Diagnosis not present

## 2017-04-13 DIAGNOSIS — I214 Non-ST elevation (NSTEMI) myocardial infarction: Secondary | ICD-10-CM

## 2017-04-15 ENCOUNTER — Other Ambulatory Visit: Payer: Self-pay | Admitting: Cardiology

## 2017-04-15 ENCOUNTER — Encounter (HOSPITAL_COMMUNITY)
Admission: RE | Admit: 2017-04-15 | Discharge: 2017-04-15 | Disposition: A | Discharge status: Home / Self Care | Payer: PPO | Source: Ambulatory Visit | Attending: Cardiology | Admitting: Cardiology

## 2017-04-15 DIAGNOSIS — I252 Old myocardial infarction: Secondary | ICD-10-CM | POA: Diagnosis not present

## 2017-04-15 DIAGNOSIS — I214 Non-ST elevation (NSTEMI) myocardial infarction: Secondary | ICD-10-CM

## 2017-04-15 DIAGNOSIS — Z955 Presence of coronary angioplasty implant and graft: Secondary | ICD-10-CM

## 2017-04-20 ENCOUNTER — Encounter (HOSPITAL_COMMUNITY)
Admission: RE | Admit: 2017-04-20 | Discharge: 2017-04-20 | Disposition: A | Discharge status: Home / Self Care | Payer: PPO | Source: Ambulatory Visit | Attending: Cardiology | Admitting: Cardiology

## 2017-04-20 DIAGNOSIS — I2510 Atherosclerotic heart disease of native coronary artery without angina pectoris: Secondary | ICD-10-CM | POA: Insufficient documentation

## 2017-04-20 DIAGNOSIS — Z955 Presence of coronary angioplasty implant and graft: Secondary | ICD-10-CM | POA: Diagnosis not present

## 2017-04-20 DIAGNOSIS — I10 Essential (primary) hypertension: Secondary | ICD-10-CM | POA: Insufficient documentation

## 2017-04-20 DIAGNOSIS — Z79899 Other long term (current) drug therapy: Secondary | ICD-10-CM | POA: Diagnosis not present

## 2017-04-20 DIAGNOSIS — I252 Old myocardial infarction: Secondary | ICD-10-CM | POA: Diagnosis not present

## 2017-04-20 DIAGNOSIS — Z7982 Long term (current) use of aspirin: Secondary | ICD-10-CM | POA: Insufficient documentation

## 2017-04-20 DIAGNOSIS — M810 Age-related osteoporosis without current pathological fracture: Secondary | ICD-10-CM | POA: Insufficient documentation

## 2017-04-20 DIAGNOSIS — I214 Non-ST elevation (NSTEMI) myocardial infarction: Secondary | ICD-10-CM

## 2017-04-20 DIAGNOSIS — F419 Anxiety disorder, unspecified: Secondary | ICD-10-CM | POA: Insufficient documentation

## 2017-04-21 ENCOUNTER — Encounter: Payer: Self-pay | Admitting: Family Medicine

## 2017-04-21 ENCOUNTER — Ambulatory Visit (INDEPENDENT_AMBULATORY_CARE_PROVIDER_SITE_OTHER): Payer: PPO | Admitting: Family Medicine

## 2017-04-21 VITALS — BP 138/76 | HR 65 | Temp 98.1°F | Resp 16 | Ht 62.0 in | Wt 151.8 lb

## 2017-04-21 DIAGNOSIS — R300 Dysuria: Secondary | ICD-10-CM

## 2017-04-21 DIAGNOSIS — M545 Low back pain, unspecified: Secondary | ICD-10-CM

## 2017-04-21 DIAGNOSIS — M549 Dorsalgia, unspecified: Secondary | ICD-10-CM | POA: Insufficient documentation

## 2017-04-21 LAB — COMPREHENSIVE METABOLIC PANEL
ALT: 16 U/L (ref 0–35)
AST: 21 U/L (ref 0–37)
Albumin: 4.1 g/dL (ref 3.5–5.2)
Alkaline Phosphatase: 122 U/L — ABNORMAL HIGH (ref 39–117)
BILIRUBIN TOTAL: 0.6 mg/dL (ref 0.2–1.2)
BUN: 11 mg/dL (ref 6–23)
CALCIUM: 9.6 mg/dL (ref 8.4–10.5)
CO2: 29 meq/L (ref 19–32)
Chloride: 108 mEq/L (ref 96–112)
Creatinine, Ser: 0.85 mg/dL (ref 0.40–1.20)
GFR: 70.58 mL/min (ref >60.00)
Glucose, Bld: 109 mg/dL — ABNORMAL HIGH (ref 70–99)
Potassium: 4 mEq/L (ref 3.5–5.1)
Sodium: 144 mEq/L (ref 135–145)
Total Protein: 7.3 g/dL (ref 6.0–8.3)

## 2017-04-21 LAB — POCT URINALYSIS DIPSTICK
Bilirubin, UA: NEGATIVE
GLUCOSE UA: NEGATIVE
Ketones, UA: NEGATIVE
NITRITE UA: POSITIVE
PH UA: 6 (ref 5.0–8.0)
Protein, UA: NEGATIVE
RBC UA: NEGATIVE
Spec Grav, UA: 1.025 (ref 1.010–1.025)
UROBILINOGEN UA: 0.2 U/dL

## 2017-04-21 MED ORDER — CIPROFLOXACIN HCL 250 MG PO TABS: 250.0000 mg | ORAL_TABLET | Freq: Two times a day (BID) | ORAL | 0 refills | Status: DC

## 2017-04-21 NOTE — Progress Notes (Deleted)
Subjective:   Patient ID: Hannah Vasquez, female    DOB: 26-Apr-1951, 67 y.o.   MRN: [id number]  Hannah Vasquez is a pleasant 66 y.o. year old female who presents to clinic today with No chief complaint on file.  on 04/21/2017  HPI:    Current Outpatient Prescriptions on File Prior to Visit  Medication Sig Dispense Refill  . albuterol (PROVENTIL HFA;VENTOLIN HFA) 108 (90 Base) MCG/ACT inhaler Inhale 1 puff into the lungs every 6 (six) hours as needed for wheezing or shortness of breath.    Marland Kitchen albuterol (PROVENTIL) (2.5 MG/3ML) 0.083% nebulizer solution Take 2.5 mg by nebulization every 6 (six) hours as needed for wheezing or shortness of breath.    Marland Kitchen aspirin EC 81 MG tablet Take 81 mg by mouth every morning.     Marland Kitchen atorvastatin (LIPITOR) 80 MG tablet Take 1 tablet (80 mg total) by mouth daily at 6 PM. 30 tablet 3  . Cholecalciferol (VITAMIN D3) 1000 UNITS CAPS Take 1 capsule by mouth every morning.     . furosemide (LASIX) 20 MG tablet Take 1 tablet (20 mg total) by mouth as needed. 30 tablet 3  . isosorbide mononitrate (IMDUR) 30 MG 24 hr tablet Take 1 tablet (30 mg total) by mouth daily. 90 tablet 3  . loratadine (CLARITIN) 10 MG tablet Take 1 tablet (10 mg total) by mouth every morning. 30 tablet 2  . losartan (COZAAR) 50 MG tablet Take 1 tablet (50 mg total) by mouth daily. 90 tablet 3  . LYRICA 75 MG capsule TAKE 1 CAPSULE BY MOUTH TWICE DAILY 60 capsule 0  . metoprolol succinate (TOPROL-XL) 50 MG 24 hr tablet Take 1 tablet (50 mg) by mouth daily with or immediately following a meal. Please keep 06/28/17 appt for future refills 90 tablet 0  . Multiple Vitamin (MULTIVITAMIN) tablet Take 1 tablet by mouth every morning.     . nitroGLYCERIN (NITROSTAT) 0.4 MG SL tablet Place 1 tablet (0.4 mg total) under the tongue every 5 (five) minutes as needed for chest pain. 25 tablet 3  . Omega-3 Fatty Acids (FISH OIL) 1200 MG CAPS Take 1 capsule by mouth every morning.     Marland Kitchen omeprazole (PRILOSEC) 40 MG  capsule Take 40 mg by mouth daily.     . potassium chloride SA (K-DUR,KLOR-CON) 10 MEQ tablet Take 1 tablet (10 mEq total) by mouth daily. 30 tablet 3  . prasugrel (EFFIENT) 10 MG TABS tablet Take 10 mg by mouth daily.     No current facility-administered medications on file prior to visit.     Allergies  Allergen Reactions  . Codeine     REACTION: nausea and vomiting  . Penicillins Nausea And Vomiting    REACTION: rash    Past Medical History:  Diagnosis Date  . Allergy   . Anxiety   . CAD (coronary artery disease)   . Cataract   . Hemorrhoids   . Hypertension   . Mitral valve prolapse   . Osteoporosis   . Status post dilation of esophageal narrowing     Past Surgical History:  Procedure Laterality Date  . BASAL CELL CARCINOMA EXCISION  02/05/2016   Dr. Sarajane Jews, [facility] Dermatology  . BLADDER SURGERY  11/15/1995   bladder neck suspension, urinary incontinence  . CARDIAC CATHETERIZATION  01/03/2017 DES LAD  . CATARACT EXTRACTION Left 04/30/2016   Dr. Tommy Rainwater, [place] N/A 01/03/2017   Procedure: Coronary Stent Intervention;  Surgeon: End,  Harrell Gave, MD;  Location: [place] [facility];  Service: Cardiovascular;  Laterality: N/A;  . DENTAL SURGERY  06/20/2016   Tooth Implant; Dr. Kalman Shan  . INTRAVASCULAR ULTRASOUND/IVUS N/A 01/03/2017   Procedure: Intravascular Ultrasound/IVUS;  Surgeon: Nelva Bush, MD;  Location: [place] [facility];  Service: Cardiovascular;  Laterality: N/A;  . KNEE SURGERY  2003   torn meniscus  . LEFT HEART CATH AND CORONARY ANGIOGRAPHY N/A 01/03/2017   Procedure: Left Heart Cath and Coronary Angiography;  Surgeon: Nelva Bush, MD;  Location: [place] [facility];  Service: Cardiovascular;  Laterality: N/A;  . MOHS SURGERY Right 2017   right side of face  . SHOULDER SURGERY Right 2005    Family History  Problem Relation Age of Onset  . Mitral valve prolapse Mother   . Hypertension Father     Social  History   Social History  . Marital status: Divorced    Spouse name: N/A  . Number of children: 2  . Years of education: N/A   Occupational History  . Medical office---front desk     Retired   Social History Main Topics  . Smoking status: Never Smoker  . Smokeless tobacco: Never Used  . Alcohol use No  . Drug use: No  . Sexual activity: Yes   Other Topics Concern  . Not on file   Social History Narrative   Has a living will.   HPOA- daughter.   She would desire CPR.  Would desire life support if necessary and not futile.         The PMH, PSH, Social History, Family History, Medications, and allergies have been reviewed in [facility], and have been updated if relevant.   Review of Systems     Objective:    LMP  (LMP Unknown)    Physical Exam        Assessment & Plan:   No diagnosis found. No Follow-up on file.

## 2017-04-21 NOTE — Progress Notes (Signed)
SUBJECTIVE: Hannah Vasquez is a 66 y.o. female who complains of urinary frequency, urgency, low back pain and dysuria intermittently for a few weeks without fever, chills, or abnormal vaginal discharge or bleeding.   Current Outpatient Prescriptions on File Prior to Visit  Medication Sig Dispense Refill  . albuterol (PROVENTIL HFA;VENTOLIN HFA) 108 (90 Base) MCG/ACT inhaler Inhale 1 puff into the lungs every 6 (six) hours as needed for wheezing or shortness of breath.    Marland Kitchen albuterol (PROVENTIL) (2.5 MG/3ML) 0.083% nebulizer solution Take 2.5 mg by nebulization every 6 (six) hours as needed for wheezing or shortness of breath.    Marland Kitchen aspirin EC 81 MG tablet Take 81 mg by mouth every morning.     Marland Kitchen atorvastatin (LIPITOR) 80 MG tablet Take 1 tablet (80 mg total) by mouth daily at 6 PM. 30 tablet 3  . Cholecalciferol (VITAMIN D3) 1000 UNITS CAPS Take 1 capsule by mouth every morning.     . furosemide (LASIX) 20 MG tablet Take 1 tablet (20 mg total) by mouth as needed. 30 tablet 3  . isosorbide mononitrate (IMDUR) 30 MG 24 hr tablet Take 1 tablet (30 mg total) by mouth daily. 90 tablet 3  . loratadine (CLARITIN) 10 MG tablet Take 1 tablet (10 mg total) by mouth every morning. 30 tablet 2  . losartan (COZAAR) 50 MG tablet Take 1 tablet (50 mg total) by mouth daily. 90 tablet 3  . LYRICA 75 MG capsule TAKE 1 CAPSULE BY MOUTH TWICE DAILY 60 capsule 0  . metoprolol succinate (TOPROL-XL) 50 MG 24 hr tablet Take 1 tablet (50 mg) by mouth daily with or immediately following a meal. Please keep 06/28/17 appt for future refills 90 tablet 0  . Multiple Vitamin (MULTIVITAMIN) tablet Take 1 tablet by mouth every morning.     . nitroGLYCERIN (NITROSTAT) 0.4 MG SL tablet Place 1 tablet (0.4 mg total) under the tongue every 5 (five) minutes as needed for chest pain. 25 tablet 3  . Omega-3 Fatty Acids (FISH OIL) 1200 MG CAPS Take 1 capsule by mouth every morning.     Marland Kitchen omeprazole (PRILOSEC) 40 MG capsule Take 40 mg by  mouth daily.     . potassium chloride SA (K-DUR,KLOR-CON) 10 MEQ tablet Take 1 tablet (10 mEq total) by mouth daily. 30 tablet 3  . prasugrel (EFFIENT) 10 MG TABS tablet Take 10 mg by mouth daily.     No current facility-administered medications on file prior to visit.     Allergies  Allergen Reactions  . Codeine     REACTION: nausea and vomiting  . Penicillins Nausea And Vomiting    REACTION: rash    Past Medical History:  Diagnosis Date  . Allergy   . Anxiety   . CAD (coronary artery disease)   . Cataract   . Hemorrhoids   . Hypertension   . Mitral valve prolapse   . Osteoporosis   . Status post dilation of esophageal narrowing     Past Surgical History:  Procedure Laterality Date  . BASAL CELL CARCINOMA EXCISION  02/05/2016   Dr. Sarajane Jews, [facility] Dermatology  . BLADDER SURGERY  11/15/1995   bladder neck suspension, urinary incontinence  . CARDIAC CATHETERIZATION  01/03/2017 DES LAD  . CATARACT EXTRACTION Left 04/30/2016   Dr. Tommy Rainwater, [place] N/A 01/03/2017   Procedure: Coronary Stent Intervention;  Surgeon: Nelva Bush, MD;  Location: [place] [facility];  Service: Cardiovascular;  Laterality: N/A;  .  DENTAL SURGERY  06/20/2016   Tooth Implant; Dr. Kalman Shan  . INTRAVASCULAR ULTRASOUND/IVUS N/A 01/03/2017   Procedure: Intravascular Ultrasound/IVUS;  Surgeon: Nelva Bush, MD;  Location: [place] [facility];  Service: Cardiovascular;  Laterality: N/A;  . KNEE SURGERY  2003   torn meniscus  . LEFT HEART CATH AND CORONARY ANGIOGRAPHY N/A 01/03/2017   Procedure: Left Heart Cath and Coronary Angiography;  Surgeon: Nelva Bush, MD;  Location: [place] [facility];  Service: Cardiovascular;  Laterality: N/A;  . MOHS SURGERY Right 2017   right side of face  . SHOULDER SURGERY Right 2005    Family History  Problem Relation Age of Onset  . Mitral valve prolapse Mother   . Hypertension Father     Social History   Social  History  . Marital status: Divorced    Spouse name: N/A  . Number of children: 2  . Years of education: N/A   Occupational History  . Medical office---front desk     Retired   Social History Main Topics  . Smoking status: Never Smoker  . Smokeless tobacco: Never Used  . Alcohol use No  . Drug use: No  . Sexual activity: Yes   Other Topics Concern  . Not on file   Social History Narrative   Has a living will.   HPOA- daughter.   She would desire CPR.  Would desire life support if necessary and not futile.         The PMH, PSH, Social History, Family History, Medications, and allergies have been reviewed in [facility], and have been updated if relevant.   OBJECTIVE:  BP 138/76   Pulse 65   Temp 98.1 F (36.7 C) (Oral)   Resp 16   Ht 5' 2"  (1.575 m)   Wt 151 lb 12.8 oz (68.9 kg)   LMP  (LMP Unknown)   SpO2 97%   BMI 27.76 kg/m    Appears well, in no apparent distress.  Vital signs are normal. The abdomen is soft without tenderness, guarding, mass, rebound or organomegaly. No CVA tenderness or inguinal adenopathy noted. Urine dipstick shows positive for WBC's, positive for nitrates and positive for leukocytes.    ASSESSMENT: UTI   PLAN: Treatment per orders - cipro 250 mg twice daily x 5 days, also push fluids, may use Pyridium OTC prn. Call or return to clinic prn if these symptoms worsen or fail to improve as anticipated.

## 2017-04-22 ENCOUNTER — Encounter (HOSPITAL_COMMUNITY)
Admission: RE | Admit: 2017-04-22 | Discharge: 2017-04-22 | Disposition: A | Discharge status: Home / Self Care | Payer: PPO | Source: Ambulatory Visit | Attending: Cardiology | Admitting: Cardiology

## 2017-04-22 ENCOUNTER — Telehealth: Payer: Self-pay | Admitting: Cardiology

## 2017-04-22 ENCOUNTER — Other Ambulatory Visit: Payer: Self-pay | Admitting: Cardiology

## 2017-04-22 DIAGNOSIS — I214 Non-ST elevation (NSTEMI) myocardial infarction: Secondary | ICD-10-CM

## 2017-04-22 DIAGNOSIS — I252 Old myocardial infarction: Secondary | ICD-10-CM | POA: Diagnosis not present

## 2017-04-22 DIAGNOSIS — Z955 Presence of coronary angioplasty implant and graft: Secondary | ICD-10-CM

## 2017-04-22 NOTE — Telephone Encounter (Signed)
Pt to continue to take as needed on days when she takes Furosemide. RX was refill today #90 X 3

## 2017-04-22 NOTE — Telephone Encounter (Signed)
New message     potassium chloride (K-DUR) 10 MEQ tablet  Is the patient suppose to be taking this?    If so she will need a refill   *STAT* If patient is at the pharmacy, call can be transferred to refill team.   1. Which medications need to be refilled? (please list name of each medication and dose if known)  potassium chloride (K-DUR) 10 MEQ tablet  2. Which pharmacy/location (including street and city if local pharmacy) is medication to be sent to? [facility] pharmacy [place] [phone]   3. Do they need a 30 day or 90 day supply?  [place]

## 2017-04-24 LAB — URINE CULTURE
MICRO NUMBER:: 80979613
SPECIMEN QUALITY: ADEQUATE

## 2017-04-25 ENCOUNTER — Encounter (HOSPITAL_COMMUNITY)
Admission: RE | Admit: 2017-04-25 | Discharge: 2017-04-25 | Disposition: A | Discharge status: Home / Self Care | Payer: PPO | Source: Ambulatory Visit | Attending: Cardiology | Admitting: Cardiology

## 2017-04-25 ENCOUNTER — Other Ambulatory Visit: Payer: Self-pay

## 2017-04-25 DIAGNOSIS — Z955 Presence of coronary angioplasty implant and graft: Secondary | ICD-10-CM

## 2017-04-25 DIAGNOSIS — I214 Non-ST elevation (NSTEMI) myocardial infarction: Secondary | ICD-10-CM

## 2017-04-25 DIAGNOSIS — I252 Old myocardial infarction: Secondary | ICD-10-CM | POA: Diagnosis not present

## 2017-04-25 MED ORDER — ATORVASTATIN CALCIUM 80 MG PO TABS: 80.0000 mg | ORAL_TABLET | Freq: Every day | ORAL | 6 refills | Status: DC

## 2017-04-27 ENCOUNTER — Encounter (HOSPITAL_COMMUNITY)
Admission: RE | Admit: 2017-04-27 | Discharge: 2017-04-27 | Disposition: A | Discharge status: Home / Self Care | Payer: PPO | Source: Ambulatory Visit | Attending: Cardiology | Admitting: Cardiology

## 2017-04-27 DIAGNOSIS — Z955 Presence of coronary angioplasty implant and graft: Secondary | ICD-10-CM

## 2017-04-27 DIAGNOSIS — I252 Old myocardial infarction: Secondary | ICD-10-CM | POA: Diagnosis not present

## 2017-04-27 DIAGNOSIS — I214 Non-ST elevation (NSTEMI) myocardial infarction: Secondary | ICD-10-CM

## 2017-04-28 ENCOUNTER — Telehealth: Payer: Self-pay | Admitting: *Deleted

## 2017-04-28 ENCOUNTER — Encounter: Payer: Self-pay | Admitting: Family Medicine

## 2017-04-28 ENCOUNTER — Ambulatory Visit (INDEPENDENT_AMBULATORY_CARE_PROVIDER_SITE_OTHER): Payer: PPO | Admitting: Family Medicine

## 2017-04-28 DIAGNOSIS — M5135 Other intervertebral disc degeneration, thoracolumbar region: Secondary | ICD-10-CM | POA: Diagnosis not present

## 2017-04-28 DIAGNOSIS — N390 Urinary tract infection, site not specified: Secondary | ICD-10-CM | POA: Insufficient documentation

## 2017-04-28 DIAGNOSIS — N3000 Acute cystitis without hematuria: Secondary | ICD-10-CM

## 2017-04-28 MED ORDER — TRAMADOL HCL 50 MG PO TABS: 50.0000 mg | ORAL_TABLET | Freq: Three times a day (TID) | ORAL | 0 refills | Status: DC | PRN

## 2017-04-28 MED ORDER — PREGABALIN 75 MG PO CAPS: 75.0000 mg | ORAL_CAPSULE | Freq: Every day | ORAL | 0 refills | Status: DC

## 2017-04-28 MED ORDER — TRAZODONE HCL 50 MG PO TABS: 25.0000 mg | ORAL_TABLET | Freq: Every evening | ORAL | 3 refills | Status: DC | PRN

## 2017-04-28 NOTE — Telephone Encounter (Signed)
Spoke with pharmacist at [facility].  He states they do not have a trazodone prescription on file for Ms. Homan.  Look like Rx might have been sent to [facility].  Bea at [facility] notified by telephone to cancel trazodone prescription.

## 2017-04-28 NOTE — Assessment & Plan Note (Signed)
Deteriorated.  And likely the source of her current pain. Will try to wean down Lyrica as it is expensive for her and not effective. ?trial of gabapentin the future. We agreed to decrease dose of Lyrica to 75 mg daily, give her an rx for tramadol to use for severe pain and refer her back to Dr. Ron Agee- re:? Epidural injections, other rx. The patient indicates understanding of these issues and agrees with the plan.

## 2017-04-28 NOTE — Patient Instructions (Signed)
Let's try to wean off lyrica- start by taking Lyrica 75 mg once daily for a couple of weeks and update me.  Tramadol as needed for pain.  Please call Dr. Ron Agee to discuss back pain and how to move forward.

## 2017-04-28 NOTE — Progress Notes (Signed)
Subjective:   Patient ID: Hannah Vasquez, female    DOB: February 26, 1951, 66 y.o.   MRN: [id number]  Hannah Vasquez is a pleasant 66 y.o. year old female who presents to clinic today with Follow-up (30 minute-multiple issues); Back Pain; and Feet Numb & Tingling  on 04/28/2017  HPI:  Treated for UTI last week. Some symptoms improving but still having upper and lower back pain.  Upper and lower back pain- was followed by Dr. Ron Agee- s/p steroid injections in 04/2015.  Has been on Lyrica 75 mg twice daily for at least two years.  Feels no longer effective.  Having tingling in feet and worsening back pain.  Years ago was on tramadol and this helped with severe pain.  MRI lumbar spine 10/25/15-  Show images for MR Lumbar Spine Wo Contrast  Study Result   CLINICAL DATA:  Low back pain for 2 years. Weakness in the legs. Prior steroid injections, most recently 04/2015.  EXAM: MRI LUMBAR SPINE WITHOUT CONTRAST  TECHNIQUE: Multiplanar, multisequence MR imaging of the lumbar spine was performed. No intravenous contrast was administered.  COMPARISON:  02/11/2013  FINDINGS: Vertebral alignment is unchanged. Vertebral body heights are preserved. There is diffuse lumbar disc desiccation. Mild to moderate disc space narrowing is again seen at L4-5 and L5-S1 with minimal to mild narrowing at L2-3 and L3-4. L1 and L4 vertebral body hemangiomas are unchanged. No significant vertebral marrow edema is seen. The conus medullaris is normal in signal and terminates at L1. Paraspinal soft tissues are unremarkable.  T12-L1: Only imaged sagittally. Small central disc extrusion with slight superior migration, stable to minimally smaller than on the prior study and without associated stenosis.  L1-2:  Negative.  L2-3:  Minimal disc bulging without stenosis, unchanged.  L3-4: Mild disc bulging slightly asymmetric to the left and mild facet and ligamentum flavum hypertrophy result in minimal  left neural foraminal stenosis without significant spinal stenosis, unchanged. A small left foraminal/ extraforaminal annular fissure is again seen.  L4-5: Circumferential disc bulging and moderate facet and ligamentum flavum hypertrophy result in mild left greater than right lateral recess stenosis, mild spinal stenosis, and moderate right and mild left neural foraminal stenosis, unchanged.  L5-S1: Circumferential disc bulging, broad central disc protrusion, ligamentum flavum hypertrophy, and moderate facet hypertrophy result in mild left greater than right neural foraminal stenosis without spinal stenosis, unchanged.  IMPRESSION: Unchanged multilevel disc and facet degeneration, greatest at L4-5 where there is mild spinal canal and lateral recess stenosis and moderate right neural foraminal stenosis.   Electronically Signed   By: Logan Bores M.D.   On: 10/26/2015 11:19          Current Outpatient Prescriptions on File Prior to Visit  Medication Sig Dispense Refill  . albuterol (PROVENTIL HFA;VENTOLIN HFA) 108 (90 Base) MCG/ACT inhaler Inhale 1 puff into the lungs every 6 (six) hours as needed for wheezing or shortness of breath.    Marland Kitchen aspirin EC 81 MG tablet Take 81 mg by mouth every morning.     Marland Kitchen atorvastatin (LIPITOR) 80 MG tablet Take 1 tablet (80 mg total) by mouth daily at 6 PM. 30 tablet 6  . Cholecalciferol (VITAMIN D3) 1000 UNITS CAPS Take 1 capsule by mouth every morning.     . isosorbide mononitrate (IMDUR) 30 MG 24 hr tablet Take 1 tablet (30 mg total) by mouth daily. 90 tablet 3  . loratadine (CLARITIN) 10 MG tablet Take 1 tablet (10 mg total) by mouth every morning.  30 tablet 2  . metoprolol succinate (TOPROL-XL) 50 MG 24 hr tablet Take 1 tablet (50 mg) by mouth daily with or immediately following a meal. Please keep 06/28/17 appt for future refills 90 tablet 0  . Multiple Vitamin (MULTIVITAMIN) tablet Take 1 tablet by mouth every morning.     .  nitroGLYCERIN (NITROSTAT) 0.4 MG SL tablet Place 1 tablet (0.4 mg total) under the tongue every 5 (five) minutes as needed for chest pain. 25 tablet 3  . Omega-3 Fatty Acids (FISH OIL) 1200 MG CAPS Take 1 capsule by mouth every morning.     Marland Kitchen omeprazole (PRILOSEC) 40 MG capsule Take 40 mg by mouth daily.     . prasugrel (EFFIENT) 10 MG TABS tablet Take 10 mg by mouth daily.    . furosemide (LASIX) 20 MG tablet Take 1 tablet (20 mg total) by mouth as needed. (Patient not taking: Reported on 04/28/2017) 30 tablet 3  . losartan (COZAAR) 50 MG tablet Take 1 tablet (50 mg total) by mouth daily. 90 tablet 3  . potassium chloride (K-DUR) 10 MEQ tablet Take 1 tablet (10 mEq total) by mouth daily. (Patient not taking: Reported on 04/28/2017) 90 tablet 3   No current facility-administered medications on file prior to visit.     Allergies  Allergen Reactions  . Codeine     REACTION: nausea and vomiting  . Penicillins Nausea And Vomiting    REACTION: rash    Past Medical History:  Diagnosis Date  . Allergy   . Anxiety   . CAD (coronary artery disease)   . Cataract   . Hemorrhoids   . Hypertension   . Mitral valve prolapse   . Osteoporosis   . Status post dilation of esophageal narrowing     Past Surgical History:  Procedure Laterality Date  . BASAL CELL CARCINOMA EXCISION  02/05/2016   Dr. Sarajane Jews, [facility]  . BLADDER SURGERY  11/15/1995   bladder neck suspension, urinary incontinence  . CARDIAC CATHETERIZATION  01/03/2017 DES LAD  . CATARACT EXTRACTION Left 04/30/2016   Dr. Tommy Rainwater, [place] N/A 01/03/2017   Procedure: Coronary Stent Intervention;  Surgeon: Nelva Bush, MD;  Location: [place] [facility];  Service: Cardiovascular;  Laterality: N/A;  . DENTAL SURGERY  06/20/2016   Tooth Implant; Dr. Kalman Shan  . INTRAVASCULAR ULTRASOUND/IVUS N/A 01/03/2017   Procedure: Intravascular Ultrasound/IVUS;  Surgeon: Nelva Bush, MD;   Location: [place] [facility];  Service: Cardiovascular;  Laterality: N/A;  . KNEE SURGERY  2003   torn meniscus  . LEFT HEART CATH AND CORONARY ANGIOGRAPHY N/A 01/03/2017   Procedure: Left Heart Cath and Coronary Angiography;  Surgeon: Nelva Bush, MD;  Location: [place] [facility];  Service: Cardiovascular;  Laterality: N/A;  . MOHS SURGERY Right 2017   right side of face  . SHOULDER SURGERY Right 2005    Family History  Problem Relation Age of Onset  . Mitral valve prolapse Mother   . Hypertension Father     Social History   Social History  . Marital status: Divorced    Spouse name: N/A  . Number of children: 2  . Years of education: N/A   Occupational History  . Medical office---front desk     Retired   Social History Main Topics  . Smoking status: Never Smoker  . Smokeless tobacco: Never Used  . Alcohol use No  . Drug use: No  . Sexual activity: Yes   Other Topics  Concern  . Not on file   Social History Narrative   Has a living will.   HPOA- daughter.   She would desire CPR.  Would desire life support if necessary and not futile.         The PMH, PSH, Social History, Family History, Medications, and allergies have been reviewed in [facility], and have been updated if relevant.   Review of Systems  Constitutional: Negative.   HENT: Negative.   Cardiovascular: Negative.   Gastrointestinal: Negative.   Genitourinary: Negative.   Musculoskeletal: Positive for back pain.  Neurological: Positive for numbness.  Hematological: Negative.   Psychiatric/Behavioral: Negative.   All other systems reviewed and are negative.      Objective:    BP 132/68   Pulse 62   Temp 98.1 F (36.7 C) (Oral)   Ht 5' 2"  (1.575 m)   Wt 151 lb (68.5 kg)   LMP  (LMP Unknown)   BMI 27.62 kg/m    Physical Exam        Assessment & Plan:   Acute cystitis without hematuria No Follow-up on file.

## 2017-04-28 NOTE — Progress Notes (Signed)
Cardiac Individual Treatment Plan  Patient Details  Name: Hannah Vasquez MRN: [id number] Date of Birth: 1951/03/31 Referring Provider:     [facility] from 02/22/2017 in [place]  Referring Provider  Candee Furbish MD      Initial Encounter Date:    [facility] from 02/22/2017 in [facility]  Date  02/22/17  Referring Provider  Candee Furbish MD      Visit Diagnosis: 01/02/17 NSTEMI (non-ST elevated myocardial infarction) ([place])  01/03/17 Stented coronary artery DES LAD  Patient's Home Medications on Admission:  Current Outpatient Prescriptions:  .  albuterol (PROVENTIL HFA;VENTOLIN HFA) 108 (90 Base) MCG/ACT inhaler, Inhale 1 puff into the lungs every 6 (six) hours as needed for wheezing or shortness of breath., Disp: , Rfl:  .  albuterol (PROVENTIL) (2.5 MG/3ML) 0.083% nebulizer solution, Take 2.5 mg by nebulization every 6 (six) hours as needed for wheezing or shortness of breath., Disp: , Rfl:  .  aspirin EC 81 MG tablet, Take 81 mg by mouth every morning. , Disp: , Rfl:  .  atorvastatin (LIPITOR) 80 MG tablet, Take 1 tablet (80 mg total) by mouth daily at 6 PM., Disp: 30 tablet, Rfl: 6 .  Cholecalciferol (VITAMIN D3) 1000 UNITS CAPS, Take 1 capsule by mouth every morning. , Disp: , Rfl:  .  ciprofloxacin (CIPRO) 250 MG tablet, Take 1 tablet (250 mg total) by mouth 2 (two) times daily., Disp: 10 tablet, Rfl: 0 .  furosemide (LASIX) 20 MG tablet, Take 1 tablet (20 mg total) by mouth as needed., Disp: 30 tablet, Rfl: 3 .  isosorbide mononitrate (IMDUR) 30 MG 24 hr tablet, Take 1 tablet (30 mg total) by mouth daily., Disp: 90 tablet, Rfl: 3 .  loratadine (CLARITIN) 10 MG tablet, Take 1 tablet (10 mg total) by mouth every morning., Disp: 30 tablet, Rfl: 2 .  losartan (COZAAR) 50 MG tablet, Take 1 tablet (50 mg total) by mouth daily., Disp: 90 tablet, Rfl: 3 .  LYRICA 75 MG capsule,  TAKE 1 CAPSULE BY MOUTH TWICE DAILY, Disp: 60 capsule, Rfl: 0 .  metoprolol succinate (TOPROL-XL) 50 MG 24 hr tablet, Take 1 tablet (50 mg) by mouth daily with or immediately following a meal. Please keep 06/28/17 appt for future refills, Disp: 90 tablet, Rfl: 0 .  Multiple Vitamin (MULTIVITAMIN) tablet, Take 1 tablet by mouth every morning. , Disp: , Rfl:  .  nitroGLYCERIN (NITROSTAT) 0.4 MG SL tablet, Place 1 tablet (0.4 mg total) under the tongue every 5 (five) minutes as needed for chest pain., Disp: 25 tablet, Rfl: 3 .  Omega-3 Fatty Acids (FISH OIL) 1200 MG CAPS, Take 1 capsule by mouth every morning. , Disp: , Rfl:  .  omeprazole (PRILOSEC) 40 MG capsule, Take 40 mg by mouth daily. , Disp: , Rfl:  .  potassium chloride (K-DUR) 10 MEQ tablet, Take 1 tablet (10 mEq total) by mouth daily., Disp: 90 tablet, Rfl: 3 .  prasugrel (EFFIENT) 10 MG TABS tablet, Take 10 mg by mouth daily., Disp: , Rfl:   Past Medical History: Past Medical History:  Diagnosis Date  . Allergy   . Anxiety   . CAD (coronary artery disease)   . Cataract   . Hemorrhoids   . Hypertension   . Mitral valve prolapse   . Osteoporosis   . Status post dilation of esophageal narrowing     Tobacco Use: History  Smoking  Status  . Never Smoker  Smokeless Tobacco  . Never Used    Labs: Recent Review Flowsheet Data    Labs for ITP Cardiac and Pulmonary Rehab Latest Ref Rng & Units 11/27/2014 09/16/2015 09/20/2016 01/02/2017 02/28/2017   Cholestrol 100 - 199 mg/dL 168 178 147 144 102   LDLCALC 0 - 99 mg/dL 102(H) 109(H) 84 82 44   LDLDIRECT mg/dL - - - - -   HDL >39 mg/dL 27.90(L) 31.50(L) 26.30(L) 23(L) 27(L)   Trlycerides 0 - 149 mg/dL 189.0(H) 188.0(H) 184.0(H) 193(H) 156(H)   Hemoglobin A1c 4.8 - 5.6 % - - - 6.1(H) -      Capillary Blood Glucose: No results found for: GLUCAP   Exercise Target Goals:    Exercise Program Goal: Individual exercise prescription set with THRR, safety & activity barriers.  Participant demonstrates ability to understand and report RPE using BORG scale, to self-measure pulse accurately, and to acknowledge the importance of the exercise prescription.  Exercise Prescription Goal: Starting with aerobic activity 30 plus minutes a day, 3 days per week for initial exercise prescription. Provide home exercise prescription and guidelines that participant acknowledges understanding prior to discharge.  Activity Barriers & Risk Stratification:     Activity Barriers & Cardiac Risk Stratification - 02/22/17 0944      Activity Barriers & Cardiac Risk Stratification   Activity Barriers Other (comment);Joint Problems;Deconditioning;Back Problems;Muscular Weakness   Comments B knee scope ~10years ago, mid to low back and hip/gluteal pain, R shoulder surgery in 2006   Cardiac Risk Stratification High      6 Minute Walk:     6 Minute Walk    Row Name 02/22/17 1437         6 Minute Walk   Phase Initial     Distance 1403 feet     Walk Time 6 minutes     # of Rest Breaks 0     MPH 2.66     METS 2.8     RPE 12     VO2 Peak 9.81     Symptoms Yes (comment)     Comments c/o lightheadedness     Resting HR 58 bpm     Resting BP 144/64     Max Ex. HR 81 bpm     Max Ex. BP 124/74     2 Minute Post BP 122/72        Oxygen Initial Assessment:   Oxygen Re-Evaluation:   Oxygen Discharge (Final Oxygen Re-Evaluation):   Initial Exercise Prescription:     Initial Exercise Prescription - 02/22/17 1400      Date of Initial Exercise RX and Referring Provider   Date 02/22/17   Referring Provider Candee Furbish MD     Bike   Level 0.5   Minutes 10   METs 2.35     NuStep   Level 3   SPM 80   Minutes 10   METs 2     Track   Laps 10   Minutes 10   METs 2.74     Prescription Details   Frequency (times per week) 3   Duration Progress to 30 minutes of continuous aerobic without signs/symptoms of physical distress     Intensity   THRR 40-80% of Max  Heartrate 61-122   Ratings of Perceived Exertion 11-15   Perceived Dyspnea 0-4     Progression   Progression Continue to progress workloads to maintain intensity without signs/symptoms of physical distress.  Resistance Training   Training Prescription Yes   Weight 2lbs   Reps 10-15      Perform Capillary Blood Glucose checks as needed.  Exercise Prescription Changes:     Exercise Prescription Changes    Row Name 03/25/17 1400 04/11/17 1700 04/26/17 1000         Response to Exercise   Blood Pressure (Admit) 116/82 122/70 128/70     Blood Pressure (Exercise) 140/74 132/64 142/66     Blood Pressure (Exit) 120/70 112/82 140/68     Heart Rate (Admit) 75 bpm 75 bpm 71 bpm     Heart Rate (Exercise) 96 bpm 108 bpm 92 bpm     Heart Rate (Exit) 70 bpm 81 bpm 69 bpm     Rating of Perceived Exertion (Exercise) 13 12 12      Symptoms  -  - none     Duration Progress to 45 minutes of aerobic exercise without signs/symptoms of physical distress Progress to 45 minutes of aerobic exercise without signs/symptoms of physical distress Progress to 45 minutes of aerobic exercise without signs/symptoms of physical distress     Intensity THRR unchanged THRR unchanged THRR unchanged       Progression   Progression Continue to progress workloads to maintain intensity without signs/symptoms of physical distress. Continue to progress workloads to maintain intensity without signs/symptoms of physical distress. Continue to progress workloads to maintain intensity without signs/symptoms of physical distress.     Average METs 2.9 3.1 3       Resistance Training   Training Prescription Yes Yes Yes     Weight 3lb 3lb 3lb     Reps 10-15 10-15 10-15     Time  -  - 10 Minutes       Interval Training   Interval Training  -  - No       Treadmill   MPH  -  - -     Grade  -  - -     Minutes  -  - -     METs  -  - -       Bike   Level -  - -     Minutes -  - -     METs -  - -       Recumbant Bike    Level 3 3.3 3.3     Minutes 10 10 10      METs 2.5 3 2.8       NuStep   Level 3 4 4      SPM 80 80 80     Minutes 10 10 10      METs 2.9 3.3 2.9       Track   Laps 13 11 13      Minutes 10 10 10      METs 3.26 2.92 3.26       Home Exercise Plan   Plans to continue exercise at Home (comment) Home (comment) Home (comment)     Frequency Add 3 additional days to program exercise sessions. Add 3 additional days to program exercise sessions. Add 3 additional days to program exercise sessions.     Initial Home Exercises Provided  - 03/16/17 03/16/17        Exercise Comments:     Exercise Comments    Row Name 03/25/17 1403 04/27/17 1107         Exercise Comments Reviewed goals with pt.  Reviewed METs and goals with patient.  Exercise Goals and Review:     Exercise Goals    Row Name 02/22/17 0948             Exercise Goals   Increase Physical Activity Yes       Intervention Provide advice, education, support and counseling about physical activity/exercise needs.;Develop an individualized exercise prescription for aerobic and resistive training based on initial evaluation findings, risk stratification, comorbidities and participant's personal goals.       Expected Outcomes Achievement of increased cardiorespiratory fitness and enhanced flexibility, muscular endurance and strength shown through measurements of functional capacity and personal statement of participant.       Increase Strength and Stamina Yes  increase walking tolerance and be able to keep up with grandkids and their activities. Learn activity limitations       Intervention Provide advice, education, support and counseling about physical activity/exercise needs.;Develop an individualized exercise prescription for aerobic and resistive training based on initial evaluation findings, risk stratification, comorbidities and participant's personal goals.       Expected Outcomes Achievement of increased  cardiorespiratory fitness and enhanced flexibility, muscular endurance and strength shown through measurements of functional capacity and personal statement of participant.          Exercise Goals Re-Evaluation :     Exercise Goals Re-Evaluation    Row Name 03/25/17 1402 04/27/17 1108           Exercise Goal Re-Evaluation   Exercise Goals Review Increase Physical Activity;Increase Strenth and Stamina Increase Physical Activity;Increase Strength and Stamina;Able to understand and use rate of perceived exertion (RPE) scale;Knowledge and understanding of Target Heart Rate Range (THRR)      Comments Pt is doing great with exercise and tolerating workload increases well.   Reviewed THRR and RPE scale with patient. Pt states she's getting stronger.      Expected Outcomes Continue with exercise Rx and gradually increase workloads in order to increase strength and stamina Increase workloads as tolerated to increase strength and stamina.          Discharge Exercise Prescription (Final Exercise Prescription Changes):     Exercise Prescription Changes - 04/26/17 1000      Response to Exercise   Blood Pressure (Admit) 128/70   Blood Pressure (Exercise) 142/66   Blood Pressure (Exit) 140/68   Heart Rate (Admit) 71 bpm   Heart Rate (Exercise) 92 bpm   Heart Rate (Exit) 69 bpm   Rating of Perceived Exertion (Exercise) 12   Symptoms none   Duration Progress to 45 minutes of aerobic exercise without signs/symptoms of physical distress   Intensity THRR unchanged     Progression   Progression Continue to progress workloads to maintain intensity without signs/symptoms of physical distress.   Average METs 3     Resistance Training   Training Prescription Yes   Weight 3lb   Reps 10-15   Time 10 Minutes     Interval Training   Interval Training No     Treadmill   MPH --   Grade --   Minutes --   METs --     Bike   Level --   Minutes --   METs --     Recumbant Bike   Level 3.3    Minutes 10   METs 2.8     NuStep   Level 4   SPM 80   Minutes 10   METs 2.9     Track   Laps 13   Minutes 10  METs 3.26     Home Exercise Plan   Plans to continue exercise at Home (comment)   Frequency Add 3 additional days to program exercise sessions.   Initial Home Exercises Provided 03/16/17      Nutrition:  Target Goals: Understanding of nutrition guidelines, daily intake of sodium <1549m, cholesterol <2045m calories 30% from fat and 7% or less from saturated fats, daily to have 5 or more servings of fruits and vegetables.  Biometrics:     Pre Biometrics - 02/22/17 1534      Pre Biometrics   Height 5' 2"  (1.575 m)   Weight 157 lb 3 oz (71.3 kg)   Waist Circumference 39 inches   Hip Circumference 42.5 inches   Waist to Hip Ratio 0.92 %   BMI (Calculated) 28.8   Triceps Skinfold 31 mm   % Body Fat 31.2 %   Grip Strength 28 kg   Flexibility 12 in   Single Leg Stand 22 seconds       Nutrition Therapy Plan and Nutrition Goals:     Nutrition Therapy & Goals - 02/22/17 1340      Nutrition Therapy   Diet Therapeutic Lifestyle Changes     Personal Nutrition Goals   Nutrition Goal Pt to identify food quantities necessary to achieve weight loss of 6-24 lb (2.7-10.9 kg) at graduation from cardiac rehab.    Personal Goal #2 Pt to continue working toward incorporating heart healthy foods/making heart healthy lifestyle choices in her diet      Intervention Plan   Intervention Prescribe, educate and counsel regarding individualized specific dietary modifications aiming towards targeted core components such as weight, hypertension, lipid management, diabetes, heart failure and other comorbidities.   Expected Outcomes Short Term Goal: Understand basic principles of dietary content, such as calories, fat, sodium, cholesterol and nutrients.;Long Term Goal: Adherence to prescribed nutrition plan.      Nutrition Discharge: Nutrition Scores:     Nutrition  Assessments - 02/22/17 1340      MEDFICTS Scores   Pre Score 7      Nutrition Goals Re-Evaluation:     Nutrition Goals Re-Evaluation    Row Name 03/21/17 1140             Goals   Current Weight 156 lb 1.4 oz (70.8 kg)       Nutrition Goal Pt to identify food quantities necessary to achieve weight loss of 6-24 lb (2.7-10.9 kg) at graduation from cardiac rehab.        Comment Pt wt is down 1 lb since admission. Continue working toward wt loss goal. Pt is continuing to work toward incorporating heart healthy foods/making heart healthy diet choices.   Handouts for 5 day menu ideas and on-line heart healthy recipe resources given         Personal Goal #2 Re-Evaluation   Personal Goal #2 Pt to continue working toward incorporating heart healthy foods/making heart healthy lifestyle choices in her diet           Nutrition Goals Re-Evaluation:     Nutrition Goals Re-Evaluation    [place] 03/21/17 1140             Goals   Current Weight 156 lb 1.4 oz (70.8 kg)       Nutrition Goal Pt to identify food quantities necessary to achieve weight loss of 6-24 lb (2.7-10.9 kg) at graduation from cardiac rehab.        Comment Pt wt is down 1  lb since admission. Continue working toward wt loss goal. Pt is continuing to work toward incorporating heart healthy foods/making heart healthy diet choices.   Handouts for 5 day menu ideas and on-line heart healthy recipe resources given         Personal Goal #2 Re-Evaluation   Personal Goal #2 Pt to continue working toward incorporating heart healthy foods/making heart healthy lifestyle choices in her diet           Nutrition Goals Discharge (Final Nutrition Goals Re-Evaluation):     Nutrition Goals Re-Evaluation - 03/21/17 1140      Goals   Current Weight 156 lb 1.4 oz (70.8 kg)   Nutrition Goal Pt to identify food quantities necessary to achieve weight loss of 6-24 lb (2.7-10.9 kg) at graduation from cardiac rehab.    Comment Pt wt is  down 1 lb since admission. Continue working toward wt loss goal. Pt is continuing to work toward incorporating heart healthy foods/making heart healthy diet choices.   Handouts for 5 day menu ideas and on-line heart healthy recipe resources given     Personal Goal #2 Re-Evaluation   Personal Goal #2 Pt to continue working toward incorporating heart healthy foods/making heart healthy lifestyle choices in her diet       Psychosocial: Target Goals: Acknowledge presence or absence of significant depression and/or stress, maximize coping skills, provide positive support system. Participant is able to verbalize types and ability to use techniques and skills needed for reducing stress and depression.  Initial Review & Psychosocial Screening:     Initial Psych Review & Screening - 02/22/17 1018      Initial Review   Current issues with None Identified     Family Dynamics   Good Support System? Yes  daughter and granddaughter    Comments upon brief assessment, no psychosocial needs identified, no interventions necessary      Barriers   Psychosocial barriers to participate in program There are no identifiable barriers or psychosocial needs.     Screening Interventions   Interventions Encouraged to exercise;Provide feedback about the scores to participant      Quality of Life Scores:     Quality of Life - 02/22/17 1019      Quality of Life Scores   Health/Function Pre 24.4 %   Socioeconomic Pre 30 %   Psych/Spiritual Pre 30 %   Family Pre 28.8 %   GLOBAL Pre 27.4 %      PHQ-9: Recent Review Flowsheet Data    Depression screen [facility] 2/9 03/02/2017 09/20/2016 09/16/2015 11/27/2014 11/27/2014   Decreased Interest 0 0 0 0 0   Down, Depressed, Hopeless 0 0 0 0 0   PHQ - 2 Score 0 0 0 0 0     Interpretation of Total Score  Total Score Depression Severity:  1-4 = Minimal depression, 5-9 = Mild depression, 10-14 = Moderate depression, 15-19 = Moderately severe depression, 20-27 = Severe  depression   Psychosocial Evaluation and Intervention:   Psychosocial Re-Evaluation:     Psychosocial Re-Evaluation    Row Name 03/30/17 1838 04/28/17 0857           Psychosocial Re-Evaluation   Current issues with Current Stress Concerns Current Stress Concerns      Comments  - Reggie has enjoyed participating in phase 2 cardiac rehab and does not feel as stressed as she did initially      Interventions Stress management education;Encouraged to attend Cardiac Rehabilitation for the exercise Stress management  education;Encouraged to attend Cardiac Rehabilitation for the exercise      Continue Psychosocial Services  No Follow up required No Follow up required        Initial Review   Source of Stress Concerns None Identified None Identified         Psychosocial Discharge (Final Psychosocial Re-Evaluation):     Psychosocial Re-Evaluation - 04/28/17 0857      Psychosocial Re-Evaluation   Current issues with Current Stress Concerns   Comments Zaniah has enjoyed participating in phase 2 cardiac rehab and does not feel as stressed as she did initially   Interventions Stress management education;Encouraged to attend Cardiac Rehabilitation for the exercise   Continue Psychosocial Services  No Follow up required     Initial Review   Source of Stress Concerns None Identified      Vocational Rehabilitation: Provide vocational rehab assistance to qualifying candidates.   Vocational Rehab Evaluation & Intervention:     Vocational Rehab - 03/02/17 1247      Initial Vocational Rehab Evaluation & Intervention   Assessment shows need for Vocational Rehabilitation No     Vocational Rehab Re-Evaulation   Comments --  Charlyne is retired and does not need vocational rehab at this time.      Education: Education Goals: Education classes will be provided on a weekly basis, covering required topics. Participant will state understanding/return demonstration of topics  presented.  Learning Barriers/Preferences:     Learning Barriers/Preferences - 02/22/17 0943      Learning Barriers/Preferences   Learning Barriers Sight  cataract surgery      Education Topics: Count Your Pulse:  -Group instruction provided by verbal instruction, demonstration, patient participation and written materials to support subject.  Instructors address importance of being able to find your pulse and how to count your pulse when at home without a heart monitor.  Patients get hands on experience counting their pulse with staff help and individually.   Heart Attack, Angina, and Risk Factor Modification:  -Group instruction provided by verbal instruction, video, and written materials to support subject.  Instructors address signs and symptoms of angina and heart attacks.    Also discuss risk factors for heart disease and how to make changes to improve heart health risk factors.   CARDIAC REHAB PHASE II EXERCISE from 04/08/2017 in [place]  Date  03/09/17  Instruction Review Code  2- meets goals/outcomes      Functional Fitness:  -Group instruction provided by verbal instruction, demonstration, patient participation, and written materials to support subject.  Instructors address safety measures for doing things around the house.  Discuss how to get up and down off the floor, how to pick things up properly, how to safely get out of a chair without assistance, and balance training.   CARDIAC REHAB PHASE II EXERCISE from 04/08/2017 in [place]  Date  04/01/17  Instruction Review Code  2- meets goals/outcomes      Meditation and Mindfulness:  -Group instruction provided by verbal instruction, patient participation, and written materials to support subject.  Instructor addresses importance of mindfulness and meditation practice to help reduce stress and improve awareness.  Instructor also leads participants through a  meditation exercise.    CARDIAC REHAB PHASE II EXERCISE from 04/08/2017 in [place]  Date  03/30/17  Instruction Review Code  2- meets goals/outcomes      Stretching for Flexibility and Mobility:  -Group instruction  provided by verbal instruction, patient participation, and written materials to support subject.  Instructors lead participants through series of stretches that are designed to increase flexibility thus improving mobility.  These stretches are additional exercise for major muscle groups that are typically performed during regular warm up and cool down.   CARDIAC REHAB PHASE II EXERCISE from 04/08/2017 in [place]  Date  04/08/17  Educator  EP  Instruction Review Code  2- meets goals/outcomes      Hands Only CPR:  -Group verbal, video, and participation provides a basic overview of AHA guidelines for community CPR. Role-play of emergencies allow participants the opportunity to practice calling for help and chest compression technique with discussion of AED use.   Hypertension: -Group verbal and written instruction that provides a basic overview of hypertension including the most recent diagnostic guidelines, risk factor reduction with self-care instructions and medication management.    Nutrition I class: Heart Healthy Eating:  -Group instruction provided by PowerPoint slides, verbal discussion, and written materials to support subject matter. The instructor gives an explanation and review of the Therapeutic Lifestyle Changes diet recommendations, which includes a discussion on lipid goals, dietary fat, sodium, fiber, plant stanol/sterol esters, sugar, and the components of a well-balanced, healthy diet.   CARDIAC REHAB PHASE II EXERCISE from 04/08/2017 in [place]  Date  03/22/17  Educator  RD  Instruction Review Code  2- meets goals/outcomes      Nutrition II class:  Lifestyle Skills:  -Group instruction provided by PowerPoint slides, verbal discussion, and written materials to support subject matter. The instructor gives an explanation and review of label reading, grocery shopping for heart health, heart healthy recipe modifications, and ways to make healthier choices when eating out.   CARDIAC REHAB PHASE II EXERCISE from 04/08/2017 in [place]  Date  02/22/17  Educator  RD  Instruction Review Code  Not applicable      Diabetes Question & Answer:  -Group instruction provided by PowerPoint slides, verbal discussion, and written materials to support subject matter. The instructor gives an explanation and review of diabetes co-morbidities, pre- and post-prandial blood glucose goals, pre-exercise blood glucose goals, signs, symptoms, and treatment of hypoglycemia and hyperglycemia, and foot care basics.   CARDIAC REHAB PHASE II EXERCISE from 04/08/2017 in [place] [facility]  Date  03/18/17  Educator  RD  Instruction Review Code  2- meets goals/outcomes      Diabetes Blitz:  -Group instruction provided by PowerPoint slides, verbal discussion, and written materials to support subject matter. The instructor gives an explanation and review of the physiology behind type 1 and type 2 diabetes, diabetes medications and rational behind using different medications, pre- and post-prandial blood glucose recommendations and Hemoglobin A1c goals, diabetes diet, and exercise including blood glucose guidelines for exercising safely.    Portion Distortion:  -Group instruction provided by PowerPoint slides, verbal discussion, written materials, and food models to support subject matter. The instructor gives an explanation of serving size versus portion size, changes in portions sizes over the last 20 years, and what consists of a serving from each food group.   CARDIAC REHAB PHASE II EXERCISE from 04/08/2017 in [place]  Date  04/06/17  Educator  RD  Instruction Review Code  2- meets goals/outcomes      Stress Management:  -Group instruction provided by verbal instruction, video, and written materials to  support subject matter.  Instructors review role of stress in heart disease and how to cope with stress positively.     Exercising on Your Own:  -Group instruction provided by verbal instruction, power point, and written materials to support subject.  Instructors discuss benefits of exercise, components of exercise, frequency and intensity of exercise, and end points for exercise.  Also discuss use of nitroglycerin and activating EMS.  Review options of places to exercise outside of rehab.  Review guidelines for sex with heart disease.   CARDIAC REHAB PHASE II EXERCISE from 04/08/2017 in [place]  Date  03/02/17  Instruction Review Code  2- meets goals/outcomes      Cardiac Drugs I:  -Group instruction provided by verbal instruction and written materials to support subject.  Instructor reviews cardiac drug classes: antiplatelets, anticoagulants, beta blockers, and statins.  Instructor discusses reasons, side effects, and lifestyle considerations for each drug class.   CARDIAC REHAB PHASE II EXERCISE from 04/08/2017 in [place]  Date  03/23/17  Instruction Review Code  2- meets goals/outcomes      Cardiac Drugs II:  -Group instruction provided by verbal instruction and written materials to support subject.  Instructor reviews cardiac drug classes: angiotensin converting enzyme inhibitors (ACE-I), angiotensin II receptor blockers (ARBs), nitrates, and calcium channel blockers.  Instructor discusses reasons, side effects, and lifestyle considerations for each drug class.   Anatomy and Physiology of the Circulatory System:  Group verbal and written instruction and models provide basic cardiac anatomy  and physiology, with the coronary electrical and arterial systems. Review of: AMI, Angina, Valve disease, Heart Failure, Peripheral Artery Disease, Cardiac Arrhythmia, Pacemakers, and the ICD.   CARDIAC REHAB PHASE II EXERCISE from 04/08/2017 in [place]  Date  03/16/17  Educator  RN  Instruction Review Code  2- meets goals/outcomes      Other Education:  -Group or individual verbal, written, or video instructions that support the educational goals of the cardiac rehab program.   Knowledge Questionnaire Score:     Knowledge Questionnaire Score - 02/22/17 1017      Knowledge Questionnaire Score   Pre Score 20/24      Core Components/Risk Factors/Patient Goals at Admission:     Personal Goals and Risk Factors at Admission - 02/22/17 1529      Core Components/Risk Factors/Patient Goals on Admission   Hypertension Yes   Intervention Provide education on lifestyle modifcations including regular physical activity/exercise, weight management, moderate sodium restriction and increased consumption of fresh fruit, vegetables, and low fat dairy, alcohol moderation, and smoking cessation.;Monitor prescription use compliance.   Expected Outcomes Short Term: Continued assessment and intervention until BP is < 140/28m HG in hypertensive participants. < 130/816mHG in hypertensive participants with diabetes, heart failure or chronic kidney disease.   Stress Yes   Intervention Offer individual and/or small group education and counseling on adjustment to heart disease, stress management and health-related lifestyle change. Teach and support self-help strategies.;Refer participants experiencing significant psychosocial distress to appropriate mental health specialists for further evaluation and treatment. When possible, include family members and significant others in education/counseling sessions.   Expected Outcomes Short Term: Participant demonstrates changes in  health-related behavior, relaxation and other stress management skills, ability to obtain effective social support, and compliance with psychotropic medications if prescribed.;Long Term: Emotional wellbeing is indicated by absence of clinically significant psychosocial distress or social isolation.      Core Components/Risk Factors/Patient  Goals Review:      Goals and Risk Factor Review    Row Name 04/28/17 0854             Core Components/Risk Factors/Patient Goals Review   Personal Goals Review Lipids;Hypertension;Stress       Review Annella is doing well with exercise at cardiac rehab. Vital signs have beens table       Expected Outcomes Jakita will continue to exercise, take her medcations as prescribed and follow a heart healthy diet. Will continue to porvide resources for stress managment as needed          Core Components/Risk Factors/Patient Goals at Discharge (Final Review):      Goals and Risk Factor Review - 04/28/17 0854      Core Components/Risk Factors/Patient Goals Review   Personal Goals Review Lipids;Hypertension;Stress   Review Kairee is doing well with exercise at cardiac rehab. Vital signs have beens table   Expected Outcomes Gudrun will continue to exercise, take her medcations as prescribed and follow a heart healthy diet. Will continue to porvide resources for stress managment as needed      ITP Comments:     ITP Comments    Row Name 02/22/17 1017           ITP Comments Dr. Fransico Him, Medical Director           Comments: Saraann is making expected progress toward personal goals after completing 24sessions. Recommend continued exercise and life style modification education including  stress management and relaxation techniques to decrease cardiac risk profile. Gina continues to do well with exercise at cardiac rehab.Barnet Pall, RN,BSN 04/28/2017 9:07 AM

## 2017-04-28 NOTE — Telephone Encounter (Signed)
-----   Message from Lucille Passy, MD sent at 04/28/2017 11:14 AM EDT ----- Please call [facility]- cancel trazodone order, keep lyrica order. Thanks!

## 2017-04-28 NOTE — Progress Notes (Signed)
Subjective:   Patient ID: Hannah Vasquez, female    DOB: 01-Apr-1951, 66 y.o.   MRN: [id number]  Hannah Vasquez is a pleasant 66 y.o. year old female who presents to clinic today with Follow-up (30 minute-multiple issues); Back Pain; and Feet Numb & Tingling  on 04/28/2017  HPI:   Current Outpatient Prescriptions on File Prior to Visit  Medication Sig Dispense Refill  . albuterol (PROVENTIL HFA;VENTOLIN HFA) 108 (90 Base) MCG/ACT inhaler Inhale 1 puff into the lungs every 6 (six) hours as needed for wheezing or shortness of breath.    Marland Kitchen aspirin EC 81 MG tablet Take 81 mg by mouth every morning.     Marland Kitchen atorvastatin (LIPITOR) 80 MG tablet Take 1 tablet (80 mg total) by mouth daily at 6 PM. 30 tablet 6  . Cholecalciferol (VITAMIN D3) 1000 UNITS CAPS Take 1 capsule by mouth every morning.     . isosorbide mononitrate (IMDUR) 30 MG 24 hr tablet Take 1 tablet (30 mg total) by mouth daily. 90 tablet 3  . loratadine (CLARITIN) 10 MG tablet Take 1 tablet (10 mg total) by mouth every morning. 30 tablet 2  . LYRICA 75 MG capsule TAKE 1 CAPSULE BY MOUTH TWICE DAILY 60 capsule 0  . metoprolol succinate (TOPROL-XL) 50 MG 24 hr tablet Take 1 tablet (50 mg) by mouth daily with or immediately following a meal. Please keep 06/28/17 appt for future refills 90 tablet 0  . Multiple Vitamin (MULTIVITAMIN) tablet Take 1 tablet by mouth every morning.     . nitroGLYCERIN (NITROSTAT) 0.4 MG SL tablet Place 1 tablet (0.4 mg total) under the tongue every 5 (five) minutes as needed for chest pain. 25 tablet 3  . Omega-3 Fatty Acids (FISH OIL) 1200 MG CAPS Take 1 capsule by mouth every morning.     Marland Kitchen omeprazole (PRILOSEC) 40 MG capsule Take 40 mg by mouth daily.     . prasugrel (EFFIENT) 10 MG TABS tablet Take 10 mg by mouth daily.    . furosemide (LASIX) 20 MG tablet Take 1 tablet (20 mg total) by mouth as needed. (Patient not taking: Reported on 04/28/2017) 30 tablet 3  . losartan (COZAAR) 50 MG tablet Take 1 tablet (50  mg total) by mouth daily. 90 tablet 3  . potassium chloride (K-DUR) 10 MEQ tablet Take 1 tablet (10 mEq total) by mouth daily. (Patient not taking: Reported on 04/28/2017) 90 tablet 3   No current facility-administered medications on file prior to visit.     Allergies  Allergen Reactions  . Codeine     REACTION: nausea and vomiting  . Penicillins Nausea And Vomiting    REACTION: rash    Past Medical History:  Diagnosis Date  . Allergy   . Anxiety   . CAD (coronary artery disease)   . Cataract   . Hemorrhoids   . Hypertension   . Mitral valve prolapse   . Osteoporosis   . Status post dilation of esophageal narrowing     Past Surgical History:  Procedure Laterality Date  . BASAL CELL CARCINOMA EXCISION  02/05/2016   Dr. Sarajane Jews, [facility]  . BLADDER SURGERY  11/15/1995   bladder neck suspension, urinary incontinence  . CARDIAC CATHETERIZATION  01/03/2017 [facility]  . CATARACT EXTRACTION Left 04/30/2016   Dr. Tommy Rainwater, [place] N/A 01/03/2017   Procedure: Coronary Stent Intervention;  Surgeon: Nelva Bush, MD;  Location: [place] [facility];  Service: Cardiovascular;  Laterality: N/A;  . DENTAL SURGERY  06/20/2016   Tooth Implant; Dr. Kalman Shan  . INTRAVASCULAR ULTRASOUND/IVUS N/A 01/03/2017   Procedure: Intravascular Ultrasound/IVUS;  Surgeon: Nelva Bush, MD;  Location: [place] [facility];  Service: Cardiovascular;  Laterality: N/A;  . KNEE SURGERY  2003   torn meniscus  . LEFT HEART CATH AND CORONARY ANGIOGRAPHY N/A 01/03/2017   Procedure: Left Heart Cath and Coronary Angiography;  Surgeon: Nelva Bush, MD;  Location: [place] [facility];  Service: Cardiovascular;  Laterality: N/A;  . MOHS SURGERY Right 2017   right side of face  . SHOULDER SURGERY Right 2005    Family History  Problem Relation Age of Onset  . Mitral valve prolapse Mother   . Hypertension Father     Social History   Social History  . Marital  status: Divorced    Spouse name: N/A  . Number of children: 2  . Years of education: N/A   Occupational History  . Medical office---front desk     Retired   Social History Main Topics  . Smoking status: Never Smoker  . Smokeless tobacco: Never Used  . Alcohol use No  . Drug use: No  . Sexual activity: Yes   Other Topics Concern  . Not on file   Social History Narrative   Has a living will.   HPOA- daughter.   She would desire CPR.  Would desire life support if necessary and not futile.         The PMH, PSH, Social History, Family History, Medications, and allergies have been reviewed in [facility], and have been updated if relevant.   Review of Systems     Objective:    BP 132/68   Pulse 62   Temp 98.1 F (36.7 C) (Oral)   Ht 5' 2"  (1.575 m)   Wt 151 lb (68.5 kg)   LMP  (LMP Unknown)   BMI 27.62 kg/m    Physical Exam        Assessment & Plan:   Acute cystitis without hematuria No Follow-up on file.

## 2017-04-29 ENCOUNTER — Encounter (HOSPITAL_COMMUNITY)
Admission: RE | Admit: 2017-04-29 | Discharge: 2017-04-29 | Disposition: A | Discharge status: Home / Self Care | Payer: PPO | Source: Ambulatory Visit | Attending: Cardiology | Admitting: Cardiology

## 2017-04-29 DIAGNOSIS — I252 Old myocardial infarction: Secondary | ICD-10-CM | POA: Diagnosis not present

## 2017-04-29 DIAGNOSIS — I214 Non-ST elevation (NSTEMI) myocardial infarction: Secondary | ICD-10-CM

## 2017-04-29 DIAGNOSIS — Z955 Presence of coronary angioplasty implant and graft: Secondary | ICD-10-CM

## 2017-05-02 ENCOUNTER — Encounter (HOSPITAL_COMMUNITY)
Admission: RE | Admit: 2017-05-02 | Discharge: 2017-05-02 | Disposition: A | Discharge status: Home / Self Care | Payer: PPO | Source: Ambulatory Visit | Attending: Cardiology | Admitting: Cardiology

## 2017-05-02 DIAGNOSIS — I252 Old myocardial infarction: Secondary | ICD-10-CM | POA: Diagnosis not present

## 2017-05-02 DIAGNOSIS — I214 Non-ST elevation (NSTEMI) myocardial infarction: Secondary | ICD-10-CM

## 2017-05-02 DIAGNOSIS — Z955 Presence of coronary angioplasty implant and graft: Secondary | ICD-10-CM

## 2017-05-04 ENCOUNTER — Encounter (HOSPITAL_COMMUNITY)
Admission: RE | Admit: 2017-05-04 | Discharge: 2017-05-04 | Disposition: A | Discharge status: Home / Self Care | Payer: PPO | Source: Ambulatory Visit | Attending: Cardiology | Admitting: Cardiology

## 2017-05-04 DIAGNOSIS — I252 Old myocardial infarction: Secondary | ICD-10-CM | POA: Diagnosis not present

## 2017-05-04 DIAGNOSIS — I214 Non-ST elevation (NSTEMI) myocardial infarction: Secondary | ICD-10-CM

## 2017-05-04 DIAGNOSIS — Z955 Presence of coronary angioplasty implant and graft: Secondary | ICD-10-CM

## 2017-05-06 ENCOUNTER — Encounter (HOSPITAL_COMMUNITY)
Admission: RE | Admit: 2017-05-06 | Discharge: 2017-05-06 | Disposition: A | Discharge status: Home / Self Care | Payer: PPO | Source: Ambulatory Visit | Attending: Cardiology | Admitting: Cardiology

## 2017-05-06 DIAGNOSIS — I214 Non-ST elevation (NSTEMI) myocardial infarction: Secondary | ICD-10-CM

## 2017-05-06 DIAGNOSIS — I252 Old myocardial infarction: Secondary | ICD-10-CM | POA: Diagnosis not present

## 2017-05-06 DIAGNOSIS — Z955 Presence of coronary angioplasty implant and graft: Secondary | ICD-10-CM

## 2017-05-09 ENCOUNTER — Encounter (HOSPITAL_COMMUNITY)
Admission: RE | Admit: 2017-05-09 | Discharge: 2017-05-09 | Disposition: A | Discharge status: Home / Self Care | Payer: PPO | Source: Ambulatory Visit | Attending: Cardiology | Admitting: Cardiology

## 2017-05-09 DIAGNOSIS — I252 Old myocardial infarction: Secondary | ICD-10-CM | POA: Diagnosis not present

## 2017-05-09 DIAGNOSIS — I214 Non-ST elevation (NSTEMI) myocardial infarction: Secondary | ICD-10-CM

## 2017-05-09 DIAGNOSIS — Z955 Presence of coronary angioplasty implant and graft: Secondary | ICD-10-CM

## 2017-05-11 ENCOUNTER — Encounter (HOSPITAL_COMMUNITY)
Admission: RE | Admit: 2017-05-11 | Discharge: 2017-05-11 | Disposition: A | Discharge status: Home / Self Care | Payer: PPO | Source: Ambulatory Visit | Attending: Cardiology | Admitting: Cardiology

## 2017-05-11 DIAGNOSIS — Z955 Presence of coronary angioplasty implant and graft: Secondary | ICD-10-CM

## 2017-05-11 DIAGNOSIS — I214 Non-ST elevation (NSTEMI) myocardial infarction: Secondary | ICD-10-CM

## 2017-05-11 DIAGNOSIS — I252 Old myocardial infarction: Secondary | ICD-10-CM | POA: Diagnosis not present

## 2017-05-13 ENCOUNTER — Encounter (HOSPITAL_COMMUNITY)
Admission: RE | Admit: 2017-05-13 | Discharge: 2017-05-13 | Disposition: A | Discharge status: Home / Self Care | Payer: PPO | Source: Ambulatory Visit | Attending: Cardiology | Admitting: Cardiology

## 2017-05-13 DIAGNOSIS — I214 Non-ST elevation (NSTEMI) myocardial infarction: Secondary | ICD-10-CM

## 2017-05-13 DIAGNOSIS — I252 Old myocardial infarction: Secondary | ICD-10-CM | POA: Diagnosis not present

## 2017-05-13 DIAGNOSIS — Z955 Presence of coronary angioplasty implant and graft: Secondary | ICD-10-CM

## 2017-05-16 ENCOUNTER — Encounter (HOSPITAL_COMMUNITY)
Admission: RE | Admit: 2017-05-16 | Discharge: 2017-05-16 | Disposition: A | Discharge status: Home / Self Care | Payer: PPO | Source: Ambulatory Visit | Attending: Cardiology | Admitting: Cardiology

## 2017-05-16 DIAGNOSIS — Z79899 Other long term (current) drug therapy: Secondary | ICD-10-CM | POA: Insufficient documentation

## 2017-05-16 DIAGNOSIS — Z7982 Long term (current) use of aspirin: Secondary | ICD-10-CM | POA: Insufficient documentation

## 2017-05-16 DIAGNOSIS — F419 Anxiety disorder, unspecified: Secondary | ICD-10-CM | POA: Diagnosis not present

## 2017-05-16 DIAGNOSIS — Z955 Presence of coronary angioplasty implant and graft: Secondary | ICD-10-CM

## 2017-05-16 DIAGNOSIS — I252 Old myocardial infarction: Secondary | ICD-10-CM | POA: Diagnosis not present

## 2017-05-16 DIAGNOSIS — I10 Essential (primary) hypertension: Secondary | ICD-10-CM | POA: Insufficient documentation

## 2017-05-16 DIAGNOSIS — M810 Age-related osteoporosis without current pathological fracture: Secondary | ICD-10-CM | POA: Insufficient documentation

## 2017-05-16 DIAGNOSIS — I214 Non-ST elevation (NSTEMI) myocardial infarction: Secondary | ICD-10-CM

## 2017-05-16 DIAGNOSIS — I2510 Atherosclerotic heart disease of native coronary artery without angina pectoris: Secondary | ICD-10-CM | POA: Insufficient documentation

## 2017-05-18 ENCOUNTER — Encounter (HOSPITAL_COMMUNITY)
Admission: RE | Admit: 2017-05-18 | Discharge: 2017-05-18 | Disposition: A | Discharge status: Home / Self Care | Payer: PPO | Source: Ambulatory Visit | Attending: Cardiology | Admitting: Cardiology

## 2017-05-18 DIAGNOSIS — I252 Old myocardial infarction: Secondary | ICD-10-CM | POA: Diagnosis not present

## 2017-05-18 DIAGNOSIS — I214 Non-ST elevation (NSTEMI) myocardial infarction: Secondary | ICD-10-CM

## 2017-05-18 DIAGNOSIS — Z955 Presence of coronary angioplasty implant and graft: Secondary | ICD-10-CM

## 2017-05-20 ENCOUNTER — Encounter (HOSPITAL_COMMUNITY)
Admission: RE | Admit: 2017-05-20 | Discharge: 2017-05-20 | Disposition: A | Discharge status: Home / Self Care | Payer: PPO | Source: Ambulatory Visit | Attending: Cardiology | Admitting: Cardiology

## 2017-05-20 DIAGNOSIS — Z955 Presence of coronary angioplasty implant and graft: Secondary | ICD-10-CM

## 2017-05-20 DIAGNOSIS — I252 Old myocardial infarction: Secondary | ICD-10-CM | POA: Diagnosis not present

## 2017-05-20 DIAGNOSIS — I214 Non-ST elevation (NSTEMI) myocardial infarction: Secondary | ICD-10-CM

## 2017-05-23 ENCOUNTER — Encounter (HOSPITAL_COMMUNITY): Payer: PPO

## 2017-05-25 ENCOUNTER — Encounter (HOSPITAL_COMMUNITY): Payer: PPO

## 2017-05-25 ENCOUNTER — Encounter (HOSPITAL_COMMUNITY): Payer: Self-pay | Admitting: *Deleted

## 2017-05-25 NOTE — Progress Notes (Signed)
Cardiac Individual Treatment Plan  Patient Details  Name: Hannah Vasquez MRN: [id number] Date of Birth: 02/08/1951 Referring Provider:     CARDIAC REHAB PHASE II ORIENTATION from 02/22/2017 in [place]  Referring Provider  Candee Furbish MD      Initial Encounter Date:    CARDIAC REHAB PHASE II ORIENTATION from 02/22/2017 in [place]  Date  02/22/17  Referring Provider  Candee Furbish MD      Visit Diagnosis: No diagnosis found.  Patient's Home Medications on Admission:  Current Outpatient Prescriptions:  .  albuterol (PROVENTIL HFA;VENTOLIN HFA) 108 (90 Base) MCG/ACT inhaler, Inhale 1 puff into the lungs every 6 (six) hours as needed for wheezing or shortness of breath., Disp: , Rfl:  .  aspirin EC 81 MG tablet, Take 81 mg by mouth every morning. , Disp: , Rfl:  .  atorvastatin (LIPITOR) 80 MG tablet, Take 1 tablet (80 mg total) by mouth daily at 6 PM., Disp: 30 tablet, Rfl: 6 .  Cholecalciferol (VITAMIN D3) 1000 UNITS CAPS, Take 1 capsule by mouth every morning. , Disp: , Rfl:  .  furosemide (LASIX) 20 MG tablet, Take 1 tablet (20 mg total) by mouth as needed. (Patient not taking: Reported on 04/28/2017), Disp: 30 tablet, Rfl: 3 .  isosorbide mononitrate (IMDUR) 30 MG 24 hr tablet, Take 1 tablet (30 mg total) by mouth daily., Disp: 90 tablet, Rfl: 3 .  loratadine (CLARITIN) 10 MG tablet, Take 1 tablet (10 mg total) by mouth every morning., Disp: 30 tablet, Rfl: 2 .  losartan (COZAAR) 50 MG tablet, Take 1 tablet (50 mg total) by mouth daily., Disp: 90 tablet, Rfl: 3 .  metoprolol succinate (TOPROL-XL) 50 MG 24 hr tablet, Take 1 tablet (50 mg) by mouth daily with or immediately following a meal. Please keep 06/28/17 appt for future refills, Disp: 90 tablet, Rfl: 0 .  Multiple Vitamin (MULTIVITAMIN) tablet, Take 1 tablet by mouth every morning. , Disp: , Rfl:  .  nitroGLYCERIN (NITROSTAT) 0.4 MG SL tablet, Place 1 tablet (0.4  mg total) under the tongue every 5 (five) minutes as needed for chest pain., Disp: 25 tablet, Rfl: 3 .  Omega-3 Fatty Acids (FISH OIL) 1200 MG CAPS, Take 1 capsule by mouth every morning. , Disp: , Rfl:  .  omeprazole (PRILOSEC) 40 MG capsule, Take 40 mg by mouth daily. , Disp: , Rfl:  .  potassium chloride (K-DUR) 10 MEQ tablet, Take 1 tablet (10 mEq total) by mouth daily. (Patient not taking: Reported on 04/28/2017), Disp: 90 tablet, Rfl: 3 .  prasugrel (EFFIENT) 10 MG TABS tablet, Take 10 mg by mouth daily., Disp: , Rfl:  .  pregabalin (LYRICA) 75 MG capsule, Take 1 capsule (75 mg total) by mouth daily., Disp: 30 capsule, Rfl: 0 .  traMADol (ULTRAM) 50 MG tablet, Take 1 tablet (50 mg total) by mouth every 8 (eight) hours as needed., Disp: 60 tablet, Rfl: 0  Past Medical History: Past Medical History:  Diagnosis Date  . Allergy   . Anxiety   . CAD (coronary artery disease)   . Cataract   . Hemorrhoids   . Hypertension   . Mitral valve prolapse   . Osteoporosis   . Status post dilation of esophageal narrowing     Tobacco Use: History  Smoking Status  . Never Smoker  Smokeless Tobacco  . Never Used    Labs: Recent Review [employer]  for ITP Cardiac and Pulmonary Rehab Latest Ref Rng & Units 11/27/2014 09/16/2015 09/20/2016 01/02/2017 02/28/2017   Cholestrol 100 - 199 mg/dL 168 178 147 144 102   LDLCALC 0 - 99 mg/dL 102(H) 109(H) 84 82 44   LDLDIRECT mg/dL - - - - -   HDL >39 mg/dL 27.90(L) 31.50(L) 26.30(L) 23(L) 27(L)   Trlycerides 0 - 149 mg/dL 189.0(H) 188.0(H) 184.0(H) 193(H) 156(H)   Hemoglobin A1c 4.8 - 5.6 % - - - 6.1(H) -      Capillary Blood Glucose: No results found for: GLUCAP   Exercise Target Goals:    Exercise Program Goal: Individual exercise prescription set with THRR, safety & activity barriers. Participant demonstrates ability to understand and report RPE using BORG scale, to self-measure pulse accurately, and to acknowledge the importance of  the exercise prescription.  Exercise Prescription Goal: Starting with aerobic activity 30 plus minutes a day, 3 days per week for initial exercise prescription. Provide home exercise prescription and guidelines that participant acknowledges understanding prior to discharge.  Activity Barriers & Risk Stratification:   6 Minute Walk:     6 Minute Walk    Row Name 05/13/17 1112         6 Minute Walk   Phase Discharge     Distance 1651 feet     Distance Feet Change 248 ft     Walk Time 6 minutes     # of Rest Breaks 0     MPH 3.21     METS 3.37     RPE 12     VO2 Peak 14.12     Symptoms No     Resting HR 74 bpm     Resting BP 118/74     Max Ex. HR 110 bpm     Max Ex. BP 182/75        Oxygen Initial Assessment:   Oxygen Re-Evaluation:   Oxygen Discharge (Final Oxygen Re-Evaluation):   Initial Exercise Prescription:   Perform Capillary Blood Glucose checks as needed.  Exercise Prescription Changes:     Exercise Prescription Changes    Row Name 03/25/17 1400 04/11/17 1700 04/26/17 1000 05/09/17 1700 05/20/17 0950     Response to Exercise   Blood Pressure (Admit) 116/82 122/70 128/70 124/72 124/72   Blood Pressure (Exercise) 140/74 132/64 142/66 136/70 124/70   Blood Pressure (Exit) 120/70 112/82 140/68 128/72 124/72   Heart Rate (Admit) 75 bpm 75 bpm 71 bpm 62 bpm 73 bpm   Heart Rate (Exercise) 96 bpm 108 bpm 92 bpm 85 bpm 100 bpm   Heart Rate (Exit) 70 bpm 81 bpm 69 bpm 61 bpm 72 bpm   Rating of Perceived Exertion (Exercise) 13 12 12 12 13    Symptoms  -  - none none none   Duration Progress to 45 minutes of aerobic exercise without signs/symptoms of physical distress Progress to 45 minutes of aerobic exercise without signs/symptoms of physical distress Progress to 45 minutes of aerobic exercise without signs/symptoms of physical distress Progress to 45 minutes of aerobic exercise without signs/symptoms of physical distress Progress to 45 minutes of aerobic  exercise without signs/symptoms of physical distress   Intensity THRR unchanged THRR unchanged THRR unchanged THRR unchanged THRR unchanged     Progression   Progression Continue to progress workloads to maintain intensity without signs/symptoms of physical distress. Continue to progress workloads to maintain intensity without signs/symptoms of physical distress. Continue to progress workloads to maintain intensity without signs/symptoms of physical distress. Continue  to progress workloads to maintain intensity without signs/symptoms of physical distress. Continue to progress workloads to maintain intensity without signs/symptoms of physical distress.   Average METs 2.9 3.1 3 3 3      Resistance Training   Training Prescription Yes Yes Yes Yes Yes   Weight 3lb 3lb 3lb 4lb 5lbs   Reps 10-15 10-15 10-15 10-15 10-15   Time  -  - 10 Minutes 10 Minutes 10 Minutes     Interval Training   Interval Training  -  - No No No     Treadmill   MPH  -  - -  -  -   Grade  -  - -  -  -   Minutes  -  - -  -  -   METs  -  - -  -  -     Bike   Level -  - -  -  -   Minutes -  - -  -  -   METs -  - -  -  -     Recumbant Bike   Level 3 3.3 3.3 3.3 3.5   Minutes 10 10 10 10 10    METs 2.5 3 2.8 2.7 2.6     NuStep   Level 3 4 4 4 5    SPM 80 80 80 80 85   Minutes 10 10 10 10 10    METs 2.9 3.3 2.9 2.6 3.2     Track   Laps 13 11 13 16 13    Minutes 10 10 10 10 10    METs 3.26 2.92 3.26 3.79 3.26     Home Exercise Plan   Plans to continue exercise at Home (comment) Home (comment) Home (comment) Home (comment) Home (comment)   Frequency Add 3 additional days to program exercise sessions. Add 3 additional days to program exercise sessions. Add 3 additional days to program exercise sessions. Add 3 additional days to program exercise sessions. Add 3 additional days to program exercise sessions.   Initial Home Exercises Provided  - 03/16/17 03/16/17 03/16/17 03/16/17      Exercise Comments:      Exercise Comments    Row Name 03/25/17 1403 04/27/17 1107 05/11/17 1112 05/20/17 0950     Exercise Comments Reviewed goals with pt.  Reviewed METs and goals with patient. Reviewed METs with patient. Patient is making good progress with exercise.       Exercise Goals and Review:   Exercise Goals Re-Evaluation :     Exercise Goals Re-Evaluation    Row Name 03/25/17 1402 04/27/17 1108 05/20/17 0950         Exercise Goal Re-Evaluation   Exercise Goals Review Increase Physical Activity;Increase Strenth and Stamina Increase Physical Activity;Increase Strength and Stamina;Able to understand and use rate of perceived exertion (RPE) scale;Knowledge and understanding of Target Heart Rate Range (THRR) Increase Physical Activity;Understanding of Exercise Prescription     Comments Pt is doing great with exercise and tolerating workload increases well.   Reviewed THRR and RPE scale with patient. Pt states she's getting stronger. [facility] [employer] [facility] graduation from the [facility] program.     Expected Outcomes Continue with exercise Rx and gradually increase workloads in order to increase strength and stamina Increase workloads as tolerated to increase strength and stamina. Patient will exercise 30 minutes 5-7 days/week to maintain health and fitness benefits.         Discharge  Exercise Prescription (Final Exercise Prescription Changes):     Exercise Prescription Changes - 05/20/17 0950      Response to Exercise   Blood Pressure (Admit) 124/72   Blood Pressure (Exercise) 124/70   Blood Pressure (Exit) 124/72   Heart Rate (Admit) 73 bpm   Heart Rate (Exercise) 100 bpm   Heart Rate (Exit) 72 bpm   Rating of Perceived Exertion (Exercise) 13   Symptoms none   Duration Progress to 45 minutes of aerobic exercise without signs/symptoms of physical distress   Intensity THRR unchanged     Progression   Progression Continue to progress workloads to  maintain intensity without signs/symptoms of physical distress.   Average METs 3     Resistance Training   Training Prescription Yes   Weight 5lbs   Reps 10-15   Time 10 Minutes     Interval Training   Interval Training No     Recumbant Bike   Level 3.5   Minutes 10   METs 2.6     NuStep   Level 5   SPM 85   Minutes 10   METs 3.2     Track   Laps 13   Minutes 10   METs 3.26     Home Exercise Plan   Plans to continue exercise at Home (comment)   Frequency Add 3 additional days to program exercise sessions.   Initial Home Exercises Provided 03/16/17      Nutrition:  Target Goals: Understanding of nutrition guidelines, daily intake of sodium <1585m, cholesterol <2086m calories 30% from fat and 7% or less from saturated fats, daily to have 5 or more servings of fruits and vegetables.  Biometrics:    Nutrition Therapy Plan and Nutrition Goals:   Nutrition Discharge: Nutrition Scores:   Nutrition Goals Re-Evaluation:   Nutrition Goals Re-Evaluation:   Nutrition Goals Discharge (Final Nutrition Goals Re-Evaluation):   Psychosocial: Target Goals: Acknowledge presence or absence of significant depression and/or stress, maximize coping skills, provide positive support system. Participant is able to verbalize types and ability to use techniques and skills needed for reducing stress and depression.  Initial Review & Psychosocial Screening:   Quality of Life Scores:   PHQ-9: Recent Review Flowsheet Data    [facility] 03/02/2017 09/20/2016 09/16/2015 11/27/2014 11/27/2014   Decreased Interest 0 0 0 0 0   Down, Depressed, Hopeless 0 0 0 0 0   PHQ - 2 Score 0 0 0 0 0     Interpretation of Total Score  Total Score Depression Severity:  1-4 = Minimal depression, 5-9 = Mild depression, 10-14 = Moderate depression, 15-19 = Moderately severe depression, 20-27 = Severe depression   Psychosocial Evaluation and Intervention:   Psychosocial  Re-Evaluation:     Psychosocial Re-Evaluation    Row Name 03/30/17 1838 04/28/17 0857 05/25/17 1727         Psychosocial Re-Evaluation   Current issues with Current Stress Concerns Current Stress Concerns Current Stress Concerns     Comments  - Hannah Vasquez enjoyed participating in phase 2 cardiac rehab and does not feel as stressed as she did initially [place] enjoyed participating in phase 2 cardiac rehab and does not feel as stressed as she did initially     Interventions Stress management education;Encouraged to attend Cardiac Rehabilitation for the exercise Stress management education;Encouraged to attend Cardiac Rehabilitation for the exercise Stress management education;Encouraged to attend Cardiac Rehabilitation for the exercise     Continue Psychosocial Services  No Follow up required No Follow up required No Follow up required       Initial Review   Source of Stress Concerns None Identified None Identified None Identified        Psychosocial Discharge (Final Psychosocial Re-Evaluation):     Psychosocial Re-Evaluation - 05/25/17 1727      Psychosocial Re-Evaluation   Current issues with Current Stress Concerns   Comments Hannah Vasquez has enjoyed participating in phase 2 cardiac rehab and does not feel as stressed as she did initially   Interventions Stress management education;Encouraged to attend Cardiac Rehabilitation for the exercise   Continue Psychosocial Services  No Follow up required     Initial Review   Source of Stress Concerns None Identified      Vocational Rehabilitation: Provide vocational rehab assistance to qualifying candidates.   Vocational Rehab Evaluation & Intervention:   Education: Education Goals: Education classes will be provided on a weekly basis, covering required topics. Participant will state understanding/return demonstration of topics presented.  Learning Barriers/Preferences:   Education Topics: Count Your Pulse:  -Group instruction  provided by verbal instruction, demonstration, patient participation and written materials to support subject.  Instructors address importance of being able to find your pulse and how to count your pulse when at home without a heart monitor.  Patients get hands on experience counting their pulse with staff help and individually.   Heart Attack, Angina, and Risk Factor Modification:  -Group instruction provided by verbal instruction, video, and written materials to support subject.  Instructors address signs and symptoms of angina and heart attacks.    Also discuss risk factors for heart disease and how to make changes to improve heart health risk factors.   CARDIAC REHAB PHASE II EXERCISE from 05/06/2017 in [place]  Date  03/09/17  Instruction Review Code  2- meets goals/outcomes      Functional Fitness:  -Group instruction provided by verbal instruction, demonstration, patient participation, and written materials to support subject.  Instructors address safety measures for doing things around the house.  Discuss how to get up and down off the floor, how to pick things up properly, how to safely get out of a chair without assistance, and balance training.   CARDIAC REHAB PHASE II EXERCISE from 05/06/2017 in [place]  Date  05/06/17  Instruction Review Code  2- meets goals/outcomes      Meditation and Mindfulness:  -Group instruction provided by verbal instruction, patient participation, and written materials to support subject.  Instructor addresses importance of mindfulness and meditation practice to help reduce stress and improve awareness.  Instructor also leads participants through a meditation exercise.    CARDIAC REHAB PHASE II EXERCISE from 05/06/2017 in [place]  Date  03/30/17  Instruction Review Code  2- meets goals/outcomes      Stretching for Flexibility and Mobility:  -Group  instruction provided by verbal instruction, patient participation, and written materials to support subject.  Instructors lead participants through series of stretches that are designed to increase flexibility thus improving mobility.  These stretches are additional exercise for major muscle groups that are typically performed during regular warm up and cool down.   CARDIAC REHAB PHASE II EXERCISE from 05/06/2017 in [facility]  Date  04/08/17  Educator  EP  Instruction Review Code  2- meets goals/outcomes      Hands Only CPR:  -Group verbal, video, and participation  provides a basic overview of AHA guidelines for community CPR. Role-play of emergencies allow participants the opportunity to practice calling for help and chest compression technique with discussion of AED use.   Hypertension: -Group verbal and written instruction that provides a basic overview of hypertension including the most recent diagnostic guidelines, risk factor reduction with self-care instructions and medication management.    Nutrition I class: Heart Healthy Eating:  -Group instruction provided by PowerPoint slides, verbal discussion, and written materials to support subject matter. The instructor gives an explanation and review of the Therapeutic Lifestyle Changes diet recommendations, which includes a discussion on lipid goals, dietary fat, sodium, fiber, plant stanol/sterol esters, sugar, and the components of a well-balanced, healthy diet.   CARDIAC REHAB PHASE II EXERCISE from 05/06/2017 in [place]  Date  03/22/17  Educator  RD  Instruction Review Code  2- meets goals/outcomes      Nutrition II class: Lifestyle Skills:  -Group instruction provided by PowerPoint slides, verbal discussion, and written materials to support subject matter. The instructor gives an explanation and review of label reading, grocery shopping for heart health, heart healthy  recipe modifications, and ways to make healthier choices when eating out.   CARDIAC REHAB PHASE II EXERCISE from 05/06/2017 in [place]  Date  02/22/17  Educator  RD  Instruction Review Code  Not applicable      Diabetes Question & Answer:  -Group instruction provided by PowerPoint slides, verbal discussion, and written materials to support subject matter. The instructor gives an explanation and review of diabetes co-morbidities, pre- and post-prandial blood glucose goals, pre-exercise blood glucose goals, signs, symptoms, and treatment of hypoglycemia and hyperglycemia, and foot care basics.   CARDIAC REHAB PHASE II EXERCISE from 05/06/2017 in [place]  Date  03/18/17  Educator  RD  Instruction Review Code  2- meets goals/outcomes      Diabetes Blitz:  -Group instruction provided by PowerPoint slides, verbal discussion, and written materials to support subject matter. The instructor gives an explanation and review of the physiology behind type 1 and type 2 diabetes, diabetes medications and rational behind using different medications, pre- and post-prandial blood glucose recommendations and Hemoglobin A1c goals, diabetes diet, and exercise including blood glucose guidelines for exercising safely.    Portion Distortion:  -Group instruction provided by PowerPoint slides, verbal discussion, written materials, and food models to support subject matter. The instructor gives an explanation of serving size versus portion size, changes in portions sizes over the last 20 years, and what consists of a serving from each food group.   CARDIAC REHAB PHASE II EXERCISE from 05/06/2017 in [place]  Date  04/06/17  Educator  RD  Instruction Review Code  2- meets goals/outcomes      Stress Management:  -Group instruction provided by verbal instruction, video, and written materials to support subject  matter.  Instructors review role of stress in heart disease and how to cope with stress positively.     Exercising on Your Own:  -Group instruction provided by verbal instruction, power point, and written materials to support subject.  Instructors discuss benefits of exercise, components of exercise, frequency and intensity of exercise, and end points for exercise.  Also discuss use of nitroglycerin and activating EMS.  Review options of places to exercise outside of rehab.  Review guidelines for sex with heart disease.   CARDIAC REHAB PHASE II EXERCISE from  05/06/2017 in [place]  Date  03/02/17  Instruction Review Code  2- meets goals/outcomes      Cardiac Drugs I:  -Group instruction provided by verbal instruction and written materials to support subject.  Instructor reviews cardiac drug classes: antiplatelets, anticoagulants, beta blockers, and statins.  Instructor discusses reasons, side effects, and lifestyle considerations for each drug class.   CARDIAC REHAB PHASE II EXERCISE from 05/06/2017 in [place]  Date  03/23/17  Instruction Review Code  2- meets goals/outcomes      Cardiac Drugs II:  -Group instruction provided by verbal instruction and written materials to support subject.  Instructor reviews cardiac drug classes: angiotensin converting enzyme inhibitors (ACE-I), angiotensin II receptor blockers (ARBs), nitrates, and calcium channel blockers.  Instructor discusses reasons, side effects, and lifestyle considerations for each drug class.   Anatomy and Physiology of the Circulatory System:  Group verbal and written instruction and models provide basic cardiac anatomy and physiology, with the coronary electrical and arterial systems. Review of: AMI, Angina, Valve disease, Heart Failure, Peripheral Artery Disease, Cardiac Arrhythmia, Pacemakers, and the ICD.   CARDIAC REHAB PHASE II EXERCISE from 05/06/2017 in [place]  Date  03/16/17  Educator  RN  Instruction Review Code  2- meets goals/outcomes      Other Education:  -Group or individual verbal, written, or video instructions that support the educational goals of the cardiac rehab program.   Knowledge Questionnaire Score:   Core Components/Risk Factors/Patient Goals at Admission:   Core Components/Risk Factors/Patient Goals Review:      Goals and Risk Factor Review    Row Name 04/28/17 0854 05/25/17 1726           Core Components/Risk Factors/Patient Goals Review   Personal Goals Review Lipids;Hypertension;Stress Lipids;Hypertension;Stress      Review Hannah Vasquez is doing well with exercise at cardiac rehab. Vital signs have beens table Hannah Vasquez is doing well with exercise at cardiac rehab. Vital signs have beens table      Expected Outcomes Hannah Vasquez will continue to exercise, take her medcations as prescribed and follow a heart healthy diet. Will continue to porvide resources for stress managment as needed Hannah Vasquez will continue to exercise, take her medcations as prescribed and follow a heart healthy diet. Will continue to porvide resources for stress managment as needed         Core Components/Risk Factors/Patient Goals at Discharge (Final Review):      Goals and Risk Factor Review - 05/25/17 1726      Core Components/Risk Factors/Patient Goals Review   Personal Goals Review Lipids;Hypertension;Stress   Review Hannah Vasquez is doing well with exercise at cardiac rehab. Vital signs have beens table   Expected Outcomes Hannah Vasquez will continue to exercise, take her medcations as prescribed and follow a heart healthy diet. Will continue to porvide resources for stress managment as needed      ITP Comments:   Comments: Hannah Vasquez is making expected progress toward personal goals after completing 35 sessions. Recommend continued exercise and life style modification education including  stress management and relaxation techniques  to decrease cardiac risk profile. Hannah Vasquez is out of town this week and will graduate on Monday.Barnet Pall, RN,BSN 05/25/2017 5:29 PM

## 2017-05-27 ENCOUNTER — Encounter (HOSPITAL_COMMUNITY): Payer: PPO

## 2017-05-30 ENCOUNTER — Encounter (HOSPITAL_COMMUNITY): Admission: RE | Admit: 2017-05-30 | Payer: PPO | Source: Ambulatory Visit

## 2017-06-01 ENCOUNTER — Encounter (HOSPITAL_COMMUNITY)
Admission: RE | Admit: 2017-06-01 | Discharge: 2017-06-01 | Disposition: A | Discharge status: Home / Self Care | Payer: PPO | Source: Ambulatory Visit | Attending: Cardiology | Admitting: Cardiology

## 2017-06-01 VITALS — BP 120/70 | HR 71 | Ht 62.0 in | Wt 148.4 lb

## 2017-06-01 DIAGNOSIS — I252 Old myocardial infarction: Secondary | ICD-10-CM | POA: Diagnosis not present

## 2017-06-01 DIAGNOSIS — Z955 Presence of coronary angioplasty implant and graft: Secondary | ICD-10-CM

## 2017-06-01 DIAGNOSIS — I214 Non-ST elevation (NSTEMI) myocardial infarction: Secondary | ICD-10-CM

## 2017-06-01 NOTE — Progress Notes (Signed)
Discharge Progress Report  Patient Details  Name: Hannah Vasquez MRN: [id number] Date of Birth: 1951/03/20 Referring Provider:     CARDIAC REHAB PHASE II ORIENTATION from 02/22/2017 in [place]  Referring Provider  Candee Furbish MD       Number of Visits: 36  Reason for Discharge:  Patient independent in their exercise.  Smoking History:  Social History   Tobacco Use  Smoking Status Never Smoker  Smokeless Tobacco Never Used    Diagnosis:  01/03/17 Stented coronary artery DES LAD  01/02/17 NSTEMI (non-ST elevated myocardial infarction) [facility])  ADL UCSD:   Initial Exercise Prescription: Initial Exercise Prescription - 02/22/17 1400      Date of Initial Exercise RX and Referring Provider   Date  02/22/17    Referring Provider  Candee Furbish MD      Bike   Level  0.5    Minutes  10    METs  2.35      NuStep   Level  3    SPM  80    Minutes  10    METs  2      Track   Laps  10    Minutes  10    METs  2.74      Prescription Details   Frequency (times per week)  3    Duration  Progress to 30 minutes of continuous aerobic without signs/symptoms of physical distress      Intensity   THRR 40-80% of Max Heartrate  61-122    Ratings of Perceived Exertion  11-15    Perceived Dyspnea  0-4      Progression   Progression  Continue to progress workloads to maintain intensity without signs/symptoms of physical distress.      Resistance Training   Training Prescription  Yes    Weight  2lbs    Reps  10-15       Discharge Exercise Prescription (Final Exercise Prescription Changes): Exercise Prescription Changes - 06/01/17 1100      Response to Exercise   Blood Pressure (Admit)  120/70    Blood Pressure (Exercise)  148/80    Blood Pressure (Exit)  138/70    Heart Rate (Admit)  71 bpm    Heart Rate (Exercise)  111 bpm    Heart Rate (Exit)  73 bpm    Rating of Perceived Exertion (Exercise)  12    Symptoms  none    Duration   Progress to 45 minutes of aerobic exercise without signs/symptoms of physical distress    Intensity  THRR unchanged      Progression   Progression  Continue to progress workloads to maintain intensity without signs/symptoms of physical distress.    Average METs  3      Resistance Training   Training Prescription  Yes    Weight  5lbs    Reps  10-15    Time  10 Minutes      Interval Training   Interval Training  No      Recumbant Bike   Level  3.5    Minutes  10    METs  2.6      NuStep   Level  5    SPM  85    Minutes  10    METs  3.2      Track   Laps  13    Minutes  10    METs  3.26  Home Exercise Plan   Plans to continue exercise at  Home (comment)    Frequency  Add 3 additional days to program exercise sessions.    Initial Home Exercises Provided  03/16/17       Functional Capacity: 6 Minute Walk    Row Name 02/22/17 1437 05/13/17 1112       6 Minute Walk   Phase  Initial  Discharge    Distance  1403 feet  1651 feet    Distance Feet Change  -  248 ft    Walk Time  6 minutes  6 minutes    # of Rest Breaks  0  0    MPH  2.66  3.21    METS  2.8  3.37    RPE  12  12    VO2 Peak  9.81  14.12    Symptoms  Yes (comment)  No    Comments  c/o lightheadedness  -    Resting HR  58 bpm  74 bpm    Resting BP  144/64  118/74    Max Ex. HR  81 bpm  110 bpm    Max Ex. BP  124/74  182/75    2 Minute Post BP  122/72  -       Psychological, QOL, Others - Outcomes: PHQ 2/9: Depression screen [facility] 2/9 06/01/2017 03/02/2017 09/20/2016 09/16/2015 11/27/2014  Decreased Interest 0 0 0 0 0  Down, Depressed, Hopeless 0 0 0 0 0  PHQ - 2 Score 0 0 0 0 0    Quality of Life: Quality of Life - 05/20/17 1000      Quality of Life Scores   Health/Function Pre  24.4 %    Health/Function Post  25.38 %    Health/Function % Change  4.02 %    Socioeconomic Pre  30 %    Socioeconomic Post  25.75 %    Socioeconomic % Change   -14.17 %    Psych/Spiritual Pre  30 %     Psych/Spiritual Post  27.43 %    Psych/Spiritual % Change  -8.57 %    Family Pre  28.8 %    Family Post  25.5 %    Family % Change  -11.46 %    GLOBAL Pre  27.4 %    GLOBAL Post  25.95 %    GLOBAL % Change  -5.29 %       Personal Goals: Goals established at orientation with interventions provided to work toward goal. Personal Goals and Risk Factors at Admission - 02/22/17 1529      Core Components/Risk Factors/Patient Goals on Admission   Hypertension  Yes    Intervention  Provide education on lifestyle modifcations including regular physical activity/exercise, weight management, moderate sodium restriction and increased consumption of fresh fruit, vegetables, and low fat dairy, alcohol moderation, and smoking cessation.;Monitor prescription use compliance.    Expected Outcomes  Short Term: Continued assessment and intervention until BP is < 140/52m HG in hypertensive participants. < 130/835mHG in hypertensive participants with diabetes, heart failure or chronic kidney disease.    Stress  Yes    Intervention  Offer individual and/or small group education and counseling on adjustment to heart disease, stress management and health-related lifestyle change. Teach and support self-help strategies.;Refer participants experiencing significant psychosocial distress to appropriate mental health specialists for further evaluation and treatment. When possible, include family members and significant others in education/counseling sessions.    Expected Outcomes  Short  Term: Participant demonstrates changes in health-related behavior, relaxation and other stress management skills, ability to obtain effective social support, and compliance with psychotropic medications if prescribed.;Long Term: Emotional wellbeing is indicated by absence of clinically significant psychosocial distress or social isolation.        Personal Goals Discharge: Goals and Risk Factor Review    Row Name 04/28/17 0854 05/25/17  1726 06/01/17 1045         Core Components/Risk Factors/Patient Goals Review   Personal Goals Review  Lipids;Hypertension;Stress  Lipids;Hypertension;Stress  Lipids;Hypertension;Stress     Review  Jessey is doing well with exercise at cardiac rehab. Vital signs have beens table  Babita is doing well with exercise at cardiac rehab. Vital signs have beens table  Davin is doing well with exercise at cardiac rehab. Vital signs have beens table     Expected Outcomes  Shelda will continue to exercise, take her medcations as prescribed and follow a heart healthy diet. Will continue to porvide resources for stress managment as needed  Adreona will continue to exercise, take her medcations as prescribed and follow a heart healthy diet. Will continue to porvide resources for stress managment as needed  [facility] Shavaun will continue to take her medications as presribed for HTN, Hyperlipedemia and her effiant        Exercise Goals and Review: Exercise Goals    Row Name 02/22/17 0948             Exercise Goals   Increase Physical Activity  Yes       Intervention  Provide advice, education, support and counseling about physical activity/exercise needs.;Develop an individualized exercise prescription for aerobic and resistive training based on initial evaluation findings, risk stratification, comorbidities and participant's personal goals.       Expected Outcomes  Achievement of increased cardiorespiratory fitness and enhanced flexibility, muscular endurance and strength shown through measurements of functional capacity and personal statement of participant.       Increase Strength and Stamina  Yes increase walking tolerance and be able to keep up with grandkids and their activities. Learn activity limitations       Intervention  Provide advice, education, support and counseling about physical activity/exercise needs.;Develop an individualized exercise prescription for aerobic  and resistive training based on initial evaluation findings, risk stratification, comorbidities and participant's personal goals.       Expected Outcomes  Achievement of increased cardiorespiratory fitness and enhanced flexibility, muscular endurance and strength shown through measurements of functional capacity and personal statement of participant.          Nutrition & Weight - Outcomes: Pre Biometrics - 02/22/17 1534      Pre Biometrics   Height  _0  (1.575 m)    Weight  157 lb 3 oz (71.3 kg)    Waist Circumference  39 inches    Hip Circumference  42.5 inches    Waist to Hip Ratio  0.92 %    BMI (Calculated)  28.8    Triceps Skinfold  31 mm    % Body Fat  31.2 %    Grip Strength  28 kg    Flexibility  12 in    Single Leg Stand  22 seconds      Post Biometrics - 06/01/17 1120       Post  Biometrics   Height  _1  (1.575 m)    Weight  148 lb 5.9 oz (67.3 kg)    Waist  Circumference  34.5 inches    Hip Circumference  41 inches    Waist to Hip Ratio  0.84 %    BMI (Calculated)  27.13    Triceps Skinfold  28 mm    % Body Fat  38.8 %    Grip Strength  28 kg    Flexibility  13 in    Single Leg Stand  11 seconds       Nutrition: Nutrition Therapy & Goals - 02/22/17 1340      Nutrition Therapy   Diet  Therapeutic Lifestyle Changes      Personal Nutrition Goals   Nutrition Goal  Pt to identify food quantities necessary to achieve weight loss of 6-24 lb (2.7-10.9 kg) at graduation from cardiac rehab.     Personal Goal #2  Pt to continue working toward incorporating heart healthy foods/making heart healthy lifestyle choices in her diet       Intervention Plan   Intervention  Prescribe, educate and counsel regarding individualized specific dietary modifications aiming towards targeted core components such as weight, hypertension, lipid management, diabetes, heart failure and other comorbidities.    Expected Outcomes  Short Term Goal: Understand basic principles of dietary  content, such as calories, fat, sodium, cholesterol and nutrients.;Long Term Goal: Adherence to prescribed nutrition plan.       Nutrition Discharge: Nutrition Assessments - 06/03/17 0905      MEDFICTS Scores   Pre Score  7    Post Score  6    Score Difference  -1       Education Questionnaire Score: Knowledge Questionnaire Score - 05/20/17 1000      Knowledge Questionnaire Score   Pre Score  20/24    Post Score  23/24       Goals reviewed with patient; copy given to patient.Raylan graduated from cardiac rehab program today with completion of 36 exercise sessions in Phase II. Pt maintained good attendance and progressed nicely during her participation in rehab as evidenced by increased MET level.   Medication list reconciled. Repeat  PHQ score- 0 .  Pt has made significant lifestyle changes and should be commended for her success. Jearldean feels she has achieved her goals during cardiac rehab.   [facility] Avonna has lost 11 pounds  during her participation in the program. Deborah increased her distance in her post walk test ECG.We are proud of Jamila's progress and her weight loss. Barnet Pall, RN,BSN 06/21/2017 11:38 AM

## 2017-06-03 ENCOUNTER — Ambulatory Visit (INDEPENDENT_AMBULATORY_CARE_PROVIDER_SITE_OTHER): Payer: PPO

## 2017-06-03 DIAGNOSIS — Z23 Encounter for immunization: Secondary | ICD-10-CM | POA: Diagnosis not present

## 2017-06-28 ENCOUNTER — Encounter: Payer: Self-pay | Admitting: Cardiology

## 2017-06-28 ENCOUNTER — Ambulatory Visit: Payer: PPO | Admitting: Cardiology

## 2017-06-28 VITALS — BP 136/68 | HR 80 | Ht 62.0 in | Wt 148.4 lb

## 2017-06-28 DIAGNOSIS — E7800 Pure hypercholesterolemia, unspecified: Secondary | ICD-10-CM | POA: Diagnosis not present

## 2017-06-28 DIAGNOSIS — R0602 Shortness of breath: Secondary | ICD-10-CM | POA: Diagnosis not present

## 2017-06-28 DIAGNOSIS — I259 Chronic ischemic heart disease, unspecified: Secondary | ICD-10-CM | POA: Diagnosis not present

## 2017-06-28 DIAGNOSIS — I214 Non-ST elevation (NSTEMI) myocardial infarction: Secondary | ICD-10-CM | POA: Diagnosis not present

## 2017-06-28 NOTE — Patient Instructions (Signed)

## 2017-06-28 NOTE — Progress Notes (Signed)
[facility]. [address], [address] [place], [place]  [address] Phone: [phone] Fax:  [phone]   Date:  06/28/2017   ID:  Hannah, Vasquez [birth year], MRN [id number]  PCP:  Lucille Passy, MD   History of Present Illness: Hannah Vasquez is a 66 y.o. female here for clinic follow up after non ST elevation myocardial infarction on 01/01/17 stating that she had chest pressure that waxed and waned then returned, troponin was 0.16. She went to the Cath Lab and received 2 stents to the LAD and showed an EF of 40-45%.Residual small ramus dz and jailed diag.   In review of Hannah Vasquez's note from 01/12/17 she had pneumonia, shingles, heart attack, can get her strength back, short of breath Overall she's been doing quite well. She lost a significant amount of weight through exercise down to 142 pounds. She's gained some of this back. Her shortness of breath felt better with decreased weight and increased stamina. Reassuring. She was changed to Effient.  She had perhaps some mild transient blurry vision. She had cataract surgery previously. Blood pressure has fluctuated but currently normal.  Previous visits: 9/13 - shingles then PNA. Said she had shingles all over?. Started on leg. A doctor at the hospital when she was visiting her father turned to her and asked her what was wrong. He then got a second opinion and told her that she had shingles. 5/14 - back pain, injections, PT 3/15 - feeling so out of breath, walking from here to door. Fatigue.  Previously out of breath. No chest pain. At times feels chest tightness. When SOB happens, sometimes just talking at rest, needs to stop and take a deep breath. Felt winded.    In 2008 she underwent nuclear stress test which was low risk with no ischemia and normal ejection fraction. She also had an echocardiogram which showed normal ejection fraction and mild mitral valve prolapse.  06/28/17  - bruise. Mild SOB at times. Prior back injections.  She  has taken dental antibiotics for several years but she no longer needs them according to her current guidelines.  Had one twinge of chest discomfort the other day, fairly atypical.  Has not had any since.  Mild.  She enjoyed cardiac rehab but she knows that maintenance therapy may end up costing more money.  She is enjoying Silver sneakers.    Wt Readings from Last 3 Encounters:  06/28/17 148 lb 6.4 oz (67.3 kg)  06/01/17 148 lb 5.9 oz (67.3 kg)  04/28/17 151 lb (68.5 kg)     Past Medical History:  Diagnosis Date  . Allergy   . Anxiety   . CAD (coronary artery disease)   . Cataract   . Hemorrhoids   . Hypertension   . Mitral valve prolapse   . Osteoporosis   . Status post dilation of esophageal narrowing     Past Surgical History:  Procedure Laterality Date  . BASAL CELL CARCINOMA EXCISION  02/05/2016   Dr. Sarajane Jews, [facility]  . BLADDER SURGERY  11/15/1995   bladder neck suspension, urinary incontinence  . CARDIAC CATHETERIZATION  01/03/2017 DES LAD  . CATARACT EXTRACTION Left 04/30/2016   Dr. Tommy Rainwater, [facility]  . DENTAL SURGERY  06/20/2016   Tooth Implant; Dr. Kalman Shan  . KNEE SURGERY  2003   torn meniscus  . MOHS SURGERY Right 2017   right side of face  . SHOULDER SURGERY Right 2005    Current Outpatient  Medications  Medication Sig Dispense Refill  . albuterol (PROVENTIL HFA;VENTOLIN HFA) 108 (90 Base) MCG/ACT inhaler Inhale 1 puff into the lungs every 6 (six) hours as needed for wheezing or shortness of breath.    Marland Kitchen aspirin EC 81 MG tablet Take 81 mg by mouth every morning.     Marland Kitchen atorvastatin (LIPITOR) 80 MG tablet Take 1 tablet (80 mg total) by mouth daily at 6 PM. 30 tablet 6  . Cholecalciferol (VITAMIN D3) 1000 UNITS CAPS Take 1 capsule by mouth every morning.     . furosemide (LASIX) 20 MG tablet Take 20 mg daily as needed by mouth for edema.    . isosorbide mononitrate (IMDUR) 30 MG 24 hr tablet Take 1 tablet (30 mg total) by mouth daily. 90 tablet  3  . loratadine (CLARITIN) 10 MG tablet Take 1 tablet (10 mg total) by mouth every morning. 30 tablet 2  . losartan (COZAAR) 50 MG tablet Take 50 mg daily by mouth.    . metoprolol succinate (TOPROL-XL) 50 MG 24 hr tablet Take 1 tablet (50 mg) by mouth daily with or immediately following a meal. Please keep 06/28/17 appt for future refills 90 tablet 0  . Multiple Vitamin (MULTIVITAMIN) tablet Take 1 tablet by mouth every morning.     . nitroGLYCERIN (NITROSTAT) 0.4 MG SL tablet Place 1 tablet (0.4 mg total) under the tongue every 5 (five) minutes as needed for chest pain. 25 tablet 3  . Omega-3 Fatty Acids (FISH OIL) 1200 MG CAPS Take 1 capsule by mouth every morning.     Marland Kitchen omeprazole (PRILOSEC) 40 MG capsule Take 40 mg by mouth daily.     . potassium chloride (K-DUR) 10 MEQ tablet Take 10 mEq daily as needed by mouth. If taking Furosemide (Lasix)    . prasugrel (EFFIENT) 10 MG TABS tablet Take 10 mg by mouth daily.    . traMADol (ULTRAM) 50 MG tablet Take 50 mg every 8 (eight) hours as needed by mouth (pain).     No current facility-administered medications for this visit.     Allergies:    Allergies  Allergen Reactions  . Codeine     REACTION: nausea and vomiting  . Penicillins Nausea And Vomiting    REACTION: rash    Social History:  The patient  reports that  has never smoked. she has never used smokeless tobacco. She reports that she does not drink alcohol or use drugs.  Retired Family History  Problem Relation Age of Onset  . Mitral valve prolapse Mother   . Hypertension Father     ROS:  Please see the history of present illness.  Occasional back pain thoracic pain, takes Tylenol, no syncope, bleeding.  Positive bruising.    All other systems reviewed and negative.   PHYSICAL EXAM: VS:  BP 136/68   Pulse 80   Ht 5' 2"  (1.575 m)   Wt 148 lb 6.4 oz (67.3 kg)   LMP  (LMP Unknown)   BMI 27.14 kg/m  GEN: Well nourished, well developed, in no acute distress  HEENT: normal    Neck: no JVD, carotid bruits, or masses Cardiac: RRR; no murmurs, rubs, or gallops,no edema  Respiratory:  clear to auscultation bilaterally, normal work of breathing GI: soft, nontender, nondistended, + BS MS: no deformity or atrophy  Skin: warm and dry, no rash, mild bruising forearms Neuro:  Alert and Oriented x 3, Strength and sensation are intact Psych: euthymic mood, full affect    EKG:  04/15/16-sinus bradycardia, 50 right bundle branch block personally viewed-no significant change from prior SR RBBB.   Chronic  NUC 2008 - low risk no ischemia.   ECHO: 01/03/14: - The LV function is normal. There is minimal MVP with trace and insignificant MR.  LABS: LDL 124, creatinine 0.9, hemoglobin 14.1, vitamin B12 232, TSH 2.23  Cath Cath 01/03/17 Conclusions: 1. Significant 2-vessel coronary artery disease, including diffuse LAD disease with up to 99% stenosis in the mid portion and TIMI-2 flow, as well as 90% small ramus lesion and 40% proximal/mid LCx disease. 2. Moderately reduced LV contraction (LVEF 35-45%) with anterior and apical akinesis. 3. Moderately elevated left ventricular filling pressure. 4. Successful IVUS-guided PCI to the mid and distal LAD with placement of 2 overlapping Resolute Onyx drug-eluting stents with 0% residual stenosis and TIMI-3 flow.  Recommendations: 1. Dual antiplatelet therapy with aspirin and ticagrelor for at least 12 months, ideally longer. 2. Aggressive secondary prevention. 3. Medical therapy for small ramus intermedius and jailed diagonal branch, as PCI would be challenging given small vessel size. 4. Diuresis and titration of evidence-based heart failure therapy, given moderately reduced LV contraction with elevated filling pressure.   Echocardiogram 01/03/17:  Study Conclusions - Left ventricle: The cavity size was normal. Wall thickness was normal. Systolic function was mildly to moderately reduced. The estimated ejection  fraction was in the range of 40% to 45%. Hypokinesis of the mid-apicalanteroseptal, anterior, and apical myocardium. Doppler parameters are consistent with abnormal left ventricular relaxation (grade 1 diastolic dysfunction). Doppler parameters are consistent with elevated mean left atrial filling pressure. - Pericardium, extracardiac: A trivial pericardial effusion was identified.   ASSESSMENT AND PLAN:   Non-ST elevation myocardial infarction 01/03/17  - LAD stents 2, residual ramus disease, jailed diagonal.  - Switched from [place] to [employer]. Some shortness of breath symptoms.  - Cardiac rehabilitation.  Going to [employer].  - Repeat echocardiogram, EF was 40-45% with anteroseptal wall hypokinesis. But see if there is any improvement with post revascularization.  Watch for bruising.  No major bleeding.  Prior platelets were normal.  I explained that this is a combination of Effient and aspirin.  After 1 year post stent placement we should be able to come off of Effient.  Hyperlipidemia  - Continue with high intensity statin therapy. LDL 44  Shortness of breath  - Improved.  - Continue with isosorbide low-dose.  - I will give her the liberty to take Lasix when necessary at this point  - Cardiac rehabilitation.  Continue with silver sneakers.  No significant mitral valve prolapse was seen on prior echo.  If she does not need dental prophylaxis.  Signed, Candee Furbish, MD [facility]  06/28/2017 8:17 AM

## 2017-06-29 ENCOUNTER — Telehealth: Payer: Self-pay | Admitting: Cardiology

## 2017-06-29 NOTE — Telephone Encounter (Signed)
Ok to have mammogram

## 2017-06-29 NOTE — Telephone Encounter (Signed)
Patient calling, states that she is scheduled to have a mammogram tomorrow morning, and would like to know if it is okay to have test or should she hold off due to stents.

## 2017-06-30 DIAGNOSIS — Z1231 Encounter for screening mammogram for malignant neoplasm of breast: Secondary | ICD-10-CM | POA: Diagnosis not present

## 2017-07-15 DIAGNOSIS — L0889 Other specified local infections of the skin and subcutaneous tissue: Secondary | ICD-10-CM | POA: Diagnosis not present

## 2017-07-15 DIAGNOSIS — D225 Melanocytic nevi of trunk: Secondary | ICD-10-CM | POA: Diagnosis not present

## 2017-07-15 DIAGNOSIS — L720 Epidermal cyst: Secondary | ICD-10-CM | POA: Diagnosis not present

## 2017-07-15 DIAGNOSIS — Z85828 Personal history of other malignant neoplasm of skin: Secondary | ICD-10-CM | POA: Diagnosis not present

## 2017-07-15 DIAGNOSIS — L918 Other hypertrophic disorders of the skin: Secondary | ICD-10-CM | POA: Diagnosis not present

## 2017-07-15 DIAGNOSIS — D1801 Hemangioma of skin and subcutaneous tissue: Secondary | ICD-10-CM | POA: Diagnosis not present

## 2017-07-15 DIAGNOSIS — D692 Other nonthrombocytopenic purpura: Secondary | ICD-10-CM | POA: Diagnosis not present

## 2017-07-15 DIAGNOSIS — L853 Xerosis cutis: Secondary | ICD-10-CM | POA: Diagnosis not present

## 2017-07-15 DIAGNOSIS — L814 Other melanin hyperpigmentation: Secondary | ICD-10-CM | POA: Diagnosis not present

## 2017-07-18 ENCOUNTER — Other Ambulatory Visit: Payer: Self-pay | Admitting: Cardiology

## 2017-08-02 ENCOUNTER — Ambulatory Visit: Payer: PPO | Admitting: Internal Medicine

## 2017-08-02 ENCOUNTER — Encounter: Payer: Self-pay | Admitting: Internal Medicine

## 2017-08-02 ENCOUNTER — Telehealth: Payer: Self-pay | Admitting: *Deleted

## 2017-08-02 VITALS — BP 126/78 | HR 67 | Temp 98.6°F | Wt 145.5 lb

## 2017-08-02 DIAGNOSIS — J3489 Other specified disorders of nose and nasal sinuses: Secondary | ICD-10-CM

## 2017-08-02 MED ORDER — TRAMADOL HCL 50 MG PO TABS: 50.0000 mg | ORAL_TABLET | Freq: Three times a day (TID) | ORAL | 0 refills | Status: DC | PRN

## 2017-08-02 MED ORDER — VALACYCLOVIR HCL 1 G PO TABS: 2000.0000 mg | ORAL_TABLET | Freq: Once | ORAL | 0 refills | Status: AC

## 2017-08-02 NOTE — Assessment & Plan Note (Signed)
Recurrent but nothing there now Was tested for something at derm---she will try to get report MRSA? Herpetic?  Will try 1 day valacyclovir for flare If that doesn't work, would consider doxy

## 2017-08-02 NOTE — Progress Notes (Signed)
Subjective:    Patient ID: Hannah Vasquez, female    DOB: Sep 05, 1950, 66 y.o.   MRN: [id number]  HPI Here due to persistent trouble with her nose Recurrent sores inside nose Occasionally a blister Swabbed by dermatologist but came back "negative"  Started 3-4 months ago Comes out on left --but now also right Very sore Up inside and very painful Will have some bloody discharge--and bad taste Will last for several days  Also has some pain along right rib cage Noticed last week with snow storm Notices it if she turns or twists Didn't shovel snow or have injury  Current Outpatient Medications on File Prior to Visit  Medication Sig Dispense Refill  . albuterol (PROVENTIL HFA;VENTOLIN HFA) 108 (90 Base) MCG/ACT inhaler Inhale 1 puff into the lungs every 6 (six) hours as needed for wheezing or shortness of breath.    Marland Kitchen aspirin EC 81 MG tablet Take 81 mg by mouth every morning.     Marland Kitchen atorvastatin (LIPITOR) 80 MG tablet Take 1 tablet (80 mg total) by mouth daily at 6 PM. 30 tablet 6  . Cholecalciferol (VITAMIN D3) 1000 UNITS CAPS Take 1 capsule by mouth every morning.     . furosemide (LASIX) 20 MG tablet Take 20 mg daily as needed by mouth for edema.    . isosorbide mononitrate (IMDUR) 30 MG 24 hr tablet Take 1 tablet (30 mg total) by mouth daily. 90 tablet 3  . loratadine (CLARITIN) 10 MG tablet Take 1 tablet (10 mg total) by mouth every morning. 30 tablet 2  . losartan (COZAAR) 50 MG tablet Take 50 mg daily by mouth.    . metoprolol succinate (TOPROL-XL) 50 MG 24 hr tablet TAKE 1 TABLET BY MOUTH ONCE A DAY WITH OR IMMEDIATELY FOLLOWING A MEAL 90 tablet 3  . Multiple Vitamin (MULTIVITAMIN) tablet Take 1 tablet by mouth every morning.     . nitroGLYCERIN (NITROSTAT) 0.4 MG SL tablet Place 1 tablet (0.4 mg total) under the tongue every 5 (five) minutes as needed for chest pain. 25 tablet 3  . Omega-3 Fatty Acids (FISH OIL) 1200 MG CAPS Take 1 capsule by mouth every morning.     Marland Kitchen  omeprazole (PRILOSEC) 40 MG capsule Take 40 mg by mouth daily.     . potassium chloride (K-DUR) 10 MEQ tablet Take 10 mEq daily as needed by mouth. If taking Furosemide (Lasix)    . prasugrel (EFFIENT) 10 MG TABS tablet Take 10 mg by mouth daily.    . traMADol (ULTRAM) 50 MG tablet Take 50 mg every 8 (eight) hours as needed by mouth (pain).     No current facility-administered medications on file prior to visit.     Allergies  Allergen Reactions  . Codeine     REACTION: nausea and vomiting  . Penicillins Nausea And Vomiting    REACTION: rash    Past Medical History:  Diagnosis Date  . Allergy   . Anxiety   . CAD (coronary artery disease)   . Cataract   . Hemorrhoids   . Hypertension   . Mitral valve prolapse   . Osteoporosis   . Status post dilation of esophageal narrowing     Past Surgical History:  Procedure Laterality Date  . BASAL CELL CARCINOMA EXCISION  02/05/2016   Dr. Sarajane Jews, [facility]  . BLADDER SURGERY  11/15/1995   bladder neck suspension, urinary incontinence  . CARDIAC CATHETERIZATION  01/03/2017 DES LAD  . CATARACT EXTRACTION Left 04/30/2016  Dr. Tommy Rainwater, [place] N/A 01/03/2017   Procedure: Coronary Stent Intervention;  Surgeon: Nelva Bush, MD;  Location: [place] [facility];  Service: Cardiovascular;  Laterality: N/A;  . DENTAL SURGERY  06/20/2016   Tooth Implant; Dr. Kalman Shan  . INTRAVASCULAR ULTRASOUND/IVUS N/A 01/03/2017   Procedure: Intravascular Ultrasound/IVUS;  Surgeon: Nelva Bush, MD;  Location: [place] [facility];  Service: Cardiovascular;  Laterality: N/A;  . KNEE SURGERY  2003   torn meniscus  . LEFT HEART CATH AND CORONARY ANGIOGRAPHY N/A 01/03/2017   Procedure: Left Heart Cath and Coronary Angiography;  Surgeon: Nelva Bush, MD;  Location: [place] [facility];  Service: Cardiovascular;  Laterality: N/A;  . MOHS SURGERY Right 2017   right side of face  . SHOULDER SURGERY Right 2005     Family History  Problem Relation Age of Onset  . Mitral valve prolapse Mother   . Hypertension Father     Social History   Socioeconomic History  . Marital status: Divorced    Spouse name: Not on file  . Number of children: 2  . Years of education: Not on file  . Highest education level: Not on file  Social Needs  . Financial resource strain: Not on file  . Food insecurity - worry: Not on file  . Food insecurity - inability: Not on file  . Transportation needs - medical: Not on file  . Transportation needs - non-medical: Not on file  Occupational History  . Occupation: Medical office---front desk    Comment: Retired  Tobacco Use  . Smoking status: Never Smoker  . Smokeless tobacco: Never Used  Substance and Sexual Activity  . Alcohol use: No    Alcohol/week: 0.0 oz  . Drug use: No  . Sexual activity: Yes  Other Topics Concern  . Not on file  Social History Narrative   Has a living will.   HPOA- daughter.   She would desire CPR.  Would desire life support if necessary and not futile.         Review of Systems No recurrent skin infections Easy bruising on the medications Never had cold sores No fever with this    Objective:   Physical Exam  Constitutional: No distress.  HENT:  No clear nasal lesions now  Pulmonary/Chest:  Slight tenderness along costal cartilage anteriorly ~T10 on left          Assessment & Plan:

## 2017-08-02 NOTE — Telephone Encounter (Signed)
Please see if you can track down this report (?fax)

## 2017-08-02 NOTE — Patient Instructions (Signed)
Please get the report from the dermatologist. When you get the next outbreak, take 2 of the valacyclovir immediately and then repeat it 12 hours later. If that clears it up, repeat for any other outbreak. If that doesn't work, let me know and I will consider an antibiotic instead.

## 2017-08-02 NOTE — Telephone Encounter (Signed)
Copied from [place] [id number]. Topic: Inquiry >> Aug 02, 2017 12:08 PM Corie Chiquito, [place] wrote: Reason for CRM: Patient calling to let Dr.Latvak know that the report from [facility] dermatologist has been sent over to the [facility] office. If someone could please give her a call back so that she will know that he has received the paper work at [phone]

## 2017-08-03 NOTE — Telephone Encounter (Signed)
I left a message on patient's voice mail that we haven't received records from her dermatologist.  I asked patient to call back and let us know the name of her dermatologist, so we can request records. I put in CRM

## 2017-08-21 ENCOUNTER — Emergency Department (HOSPITAL_COMMUNITY): Payer: PPO

## 2017-08-21 ENCOUNTER — Encounter (HOSPITAL_COMMUNITY): Payer: Self-pay | Admitting: Internal Medicine

## 2017-08-21 ENCOUNTER — Other Ambulatory Visit: Payer: Self-pay

## 2017-08-21 ENCOUNTER — Observation Stay (HOSPITAL_COMMUNITY)
Admission: EM | Admit: 2017-08-21 | Discharge: 2017-08-22 | Disposition: A | Discharge status: Home / Self Care | Payer: PPO | Attending: Internal Medicine | Admitting: Internal Medicine

## 2017-08-21 DIAGNOSIS — Z7982 Long term (current) use of aspirin: Secondary | ICD-10-CM | POA: Insufficient documentation

## 2017-08-21 DIAGNOSIS — Z955 Presence of coronary angioplasty implant and graft: Secondary | ICD-10-CM | POA: Diagnosis not present

## 2017-08-21 DIAGNOSIS — I252 Old myocardial infarction: Secondary | ICD-10-CM | POA: Diagnosis not present

## 2017-08-21 DIAGNOSIS — Z85828 Personal history of other malignant neoplasm of skin: Secondary | ICD-10-CM | POA: Diagnosis not present

## 2017-08-21 DIAGNOSIS — Z880 Allergy status to penicillin: Secondary | ICD-10-CM | POA: Diagnosis not present

## 2017-08-21 DIAGNOSIS — I2510 Atherosclerotic heart disease of native coronary artery without angina pectoris: Secondary | ICD-10-CM | POA: Diagnosis present

## 2017-08-21 DIAGNOSIS — R0789 Other chest pain: Secondary | ICD-10-CM | POA: Diagnosis not present

## 2017-08-21 DIAGNOSIS — Z79899 Other long term (current) drug therapy: Secondary | ICD-10-CM | POA: Insufficient documentation

## 2017-08-21 DIAGNOSIS — F419 Anxiety disorder, unspecified: Secondary | ICD-10-CM | POA: Insufficient documentation

## 2017-08-21 DIAGNOSIS — R0602 Shortness of breath: Secondary | ICD-10-CM | POA: Diagnosis not present

## 2017-08-21 DIAGNOSIS — I341 Nonrheumatic mitral (valve) prolapse: Secondary | ICD-10-CM | POA: Diagnosis not present

## 2017-08-21 DIAGNOSIS — R5383 Other fatigue: Secondary | ICD-10-CM

## 2017-08-21 DIAGNOSIS — I10 Essential (primary) hypertension: Secondary | ICD-10-CM | POA: Insufficient documentation

## 2017-08-21 DIAGNOSIS — G4733 Obstructive sleep apnea (adult) (pediatric): Secondary | ICD-10-CM | POA: Insufficient documentation

## 2017-08-21 DIAGNOSIS — R110 Nausea: Secondary | ICD-10-CM

## 2017-08-21 DIAGNOSIS — R079 Chest pain, unspecified: Secondary | ICD-10-CM | POA: Diagnosis not present

## 2017-08-21 DIAGNOSIS — R0609 Other forms of dyspnea: Secondary | ICD-10-CM

## 2017-08-21 LAB — BASIC METABOLIC PANEL
ANION GAP: 7 (ref 5–15)
BUN: 12 mg/dL (ref 6–20)
CHLORIDE: 106 mmol/L (ref 101–111)
CO2: 25 mmol/L (ref 22–32)
Calcium: 9.5 mg/dL (ref 8.9–10.3)
Creatinine, Ser: 0.81 mg/dL (ref 0.44–1.00)
GFR calc Af Amer: >60 mL/min (ref >60)
GLUCOSE: 120 mg/dL — AB (ref 65–99)
POTASSIUM: 3.9 mmol/L (ref 3.5–5.1)
Sodium: 138 mmol/L (ref 135–145)

## 2017-08-21 LAB — LIPASE, BLOOD: Lipase: 34 U/L (ref 11–51)

## 2017-08-21 LAB — CBC
HEMATOCRIT: 39.9 % (ref 36.0–46.0)
HEMOGLOBIN: 12.7 g/dL (ref 12.0–15.0)
MCH: 26 pg (ref 26.0–34.0)
MCHC: 31.8 g/dL (ref 30.0–36.0)
MCV: 81.6 fL (ref 78.0–100.0)
Platelets: 323 10*3/uL (ref 150–400)
RBC: 4.89 MIL/uL (ref 3.87–5.11)
RDW: 14.7 % (ref 11.5–15.5)
WBC: 9.4 10*3/uL (ref 4.0–10.5)

## 2017-08-21 LAB — I-STAT TROPONIN, ED: Troponin i, poc: 0 ng/mL (ref 0.00–0.08)

## 2017-08-21 LAB — BRAIN NATRIURETIC PEPTIDE: B NATRIURETIC PEPTIDE 5: 31.2 pg/mL (ref 0.0–100.0)

## 2017-08-21 LAB — TROPONIN I: Troponin I: <0.03 ng/mL (ref <0.03)

## 2017-08-21 MED ORDER — ASPIRIN 81 MG PO CHEW
162.0000 mg | CHEWABLE_TABLET | Freq: Once | ORAL | Status: AC
  Administered 2017-08-21: 162 mg via ORAL
  Filled 2017-08-21: qty 2

## 2017-08-21 MED ORDER — ONDANSETRON HCL 4 MG/2ML IJ SOLN
4.0000 mg | Freq: Once | INTRAMUSCULAR | Status: AC
  Administered 2017-08-21: 4 mg via INTRAVENOUS
  Filled 2017-08-21: qty 2

## 2017-08-21 NOTE — ED Provider Notes (Signed)
[facility] EMERGENCY DEPARTMENT Provider Note   CSN: [id number] Arrival date & time: 08/21/17  1112     History   Chief Complaint Chief Complaint  Patient presents with  . Generalized Body Aches  . Chest Pain  . Nausea    HPI Hannah Vasquez is a 67 y.o. female.  Patient is a 67 year old female with a history of coronary artery disease status post 2 stents in May, allergies, hypertension and hypokalemia presenting today with 2 days of worsening extreme fatigue, nausea and intermittent left-sided chest burning.  Patient denies cough, congestion, fever.  She has no abdominal pain.  No lower extremity swelling.  Family states that patient has been more short of breath.  Patient does admit that she has been winded with walking even to the mailbox which is new.  The chest pain does not feel like her prior MI and does not seem to be related to exertion.   The history is provided by the patient.  Chest Pain   This is a new problem. The current episode started 2 days ago. The problem occurs daily. The problem has not changed since onset.Associated with: pain and tingling in the left chest is intermittent without acute cause but SOB is with exertion. The pain is present in the lateral region. The pain is at a severity of 2/10. The pain is mild. Quality: tingling and sometimes burning. The pain does not radiate. Episode Length: last for short periods of time. Associated symptoms include nausea and shortness of breath. Pertinent negatives include no cough, no sputum production, no syncope, no vomiting and no weakness. Associated symptoms comments: Severe fatigue. She has tried rest for the symptoms. The treatment provided no relief. Past medical history comments: hx of MI and 2 stents in may  Procedure history is positive for cardiac catheterization.    Past Medical History:  Diagnosis Date  . Allergy   . Anxiety   . CAD (coronary artery disease)   . Cataract   . Hemorrhoids    . Hypertension   . Mitral valve prolapse   . Osteoporosis   . Status post dilation of esophageal narrowing     Patient Active Problem List   Diagnosis Date Noted  . Internal nasal lesion 08/02/2017  . UTI (urinary tract infection) 04/28/2017  . DDD (degenerative disc disease), thoracolumbar 04/28/2017  . Back pain 04/21/2017  . Hypokalemia 01/06/2017  . NSTEMI (non-ST elevated myocardial infarction) ([place]) 01/01/2017  . Acute non-recurrent maxillary sinusitis 09/20/2016  . RBBB 12/19/2013  . Mitral valve prolapse   . HYPERTENSION, BENIGN 08/27/2010  . Anxiety state 06/27/2009  . CAD 06/27/2009  . Mitral valve disorder 06/27/2009  . HEMORRHOIDS 06/27/2009  . ALLERGIC RHINITIS 06/27/2009  . OSTEOPENIA 11/11/2006    Past Surgical History:  Procedure Laterality Date  . BASAL CELL CARCINOMA EXCISION  02/05/2016   Dr. Sarajane Jews, [facility]  . BLADDER SURGERY  11/15/1995   bladder neck suspension, urinary incontinence  . CARDIAC CATHETERIZATION  01/03/2017 DES LAD  . CATARACT EXTRACTION Left 04/30/2016   Dr. Tommy Rainwater, [place] N/A 01/03/2017   Procedure: Coronary Stent Intervention;  Surgeon: Nelva Bush, MD;  Location: [place] [facility];  Service: Cardiovascular;  Laterality: N/A;  . DENTAL SURGERY  06/20/2016   Tooth Implant; Dr. Kalman Shan  . INTRAVASCULAR ULTRASOUND/IVUS N/A 01/03/2017   Procedure: Intravascular Ultrasound/IVUS;  Surgeon: Nelva Bush, MD;  Location: [place] [facility];  Service: Cardiovascular;  Laterality: N/A;  .  KNEE SURGERY  2003   torn meniscus  . LEFT HEART CATH AND CORONARY ANGIOGRAPHY N/A 01/03/2017   Procedure: Left Heart Cath and Coronary Angiography;  Surgeon: Nelva Bush, MD;  Location: [place] [facility];  Service: Cardiovascular;  Laterality: N/A;  . MOHS SURGERY Right 2017   right side of face  . SHOULDER SURGERY Right 2005    OB History    No data available       Home  Medications    Prior to Admission medications   Medication Sig Start Date End Date Taking? Authorizing Provider  albuterol (PROVENTIL HFA;VENTOLIN HFA) 108 (90 Base) MCG/ACT inhaler Inhale 1 puff into the lungs every 6 (six) hours as needed for wheezing or shortness of breath.   Yes [provider]  aspirin EC 81 MG tablet Take 81 mg by mouth every morning.    Yes [provider]  atorvastatin (LIPITOR) 80 MG tablet Take 1 tablet (80 mg total) by mouth daily at 6 PM. 04/25/17  Yes Duke, Tami Lin, PA  Cholecalciferol (VITAMIN D3) 1000 UNITS CAPS Take 1 capsule by mouth every morning.    Yes [provider]  furosemide (LASIX) 20 MG tablet Take 20 mg daily as needed by mouth for edema. 03/26/17  Yes [provider]  isosorbide mononitrate (IMDUR) 30 MG 24 hr tablet Take 1 tablet (30 mg total) by mouth daily. 02/07/17  Yes Burtis Junes, NP  loratadine (CLARITIN) 10 MG tablet Take 1 tablet (10 mg total) by mouth every morning. 11/08/13  Yes Lucille Passy, MD  losartan (COZAAR) 50 MG tablet Take 50 mg daily by mouth.   Yes [provider]  metoprolol succinate (TOPROL-XL) 50 MG 24 hr tablet TAKE 1 TABLET BY MOUTH ONCE A DAY WITH OR IMMEDIATELY FOLLOWING A MEAL 07/18/17  Yes Jerline Pain, MD  Multiple Vitamin (MULTIVITAMIN) tablet Take 1 tablet by mouth every morning.    Yes [provider]  Omega-3 Fatty Acids (FISH OIL) 1200 MG CAPS Take 1 capsule by mouth every morning.    Yes [provider]  omeprazole (PRILOSEC) 40 MG capsule Take 40 mg by mouth daily.  05/21/16  Yes [provider]  potassium chloride (K-DUR) 10 MEQ tablet Take 10 mEq daily as needed by mouth. If taking Furosemide (Lasix) 04/22/17  Yes [provider]  prasugrel (EFFIENT) 10 MG TABS tablet Take 10 mg by mouth daily.   Yes [provider]  traMADol (ULTRAM) 50 MG tablet Take 1 tablet (50 mg total) by mouth every 8 (eight) hours as needed  (pain). 08/02/17  Yes Venia Carbon, MD  valACYclovir (VALTREX) 1000 MG tablet Take 2 g by mouth 2 (two) times daily. 08/02/17  Yes [provider]  nitroGLYCERIN (NITROSTAT) 0.4 MG SL tablet Place 1 tablet (0.4 mg total) under the tongue every 5 (five) minutes as needed for chest pain. 01/04/17   Duke, Tami Lin, PA    Family History Family History  Problem Relation Age of Onset  . Mitral valve prolapse Mother   . Hypertension Father     Social History Social History   Tobacco Use  . Smoking status: Never Smoker  . Smokeless tobacco: Never Used  Substance Use Topics  . Alcohol use: No    Alcohol/week: 0.0 oz  . Drug use: No     Allergies   Codeine and Penicillins   Review of Systems Review of Systems  HENT:       Patient  has recently intermittently been taking Valtrex for some lesions in her nose.  They are small sores that come and go.  However these have not significantly changed  Respiratory: Positive for shortness of breath. Negative for cough and sputum production.   Cardiovascular: Positive for chest pain. Negative for syncope.  Gastrointestinal: Positive for nausea. Negative for vomiting.  Neurological: Negative for weakness.  All other systems reviewed and are negative.    Physical Exam Updated Vital Signs BP 132/75   Pulse 62   Temp 98.7 F (37.1 C) (Oral)   Resp 16   Ht 5' 2"  (1.575 m)   Wt 64.9 kg (143 lb)   LMP  (LMP Unknown)   SpO2 98%   BMI 26.16 kg/m   Physical Exam  Constitutional: She is oriented to person, place, and time. She appears well-developed and well-nourished. No distress.  HENT:  Head: Normocephalic and atraumatic.  Mouth/Throat: Oropharynx is clear and moist.  Eyes: Conjunctivae and EOM are normal. Pupils are equal, round, and reactive to light.  Neck: Normal range of motion. Neck supple. No JVD present.  Cardiovascular: Normal rate, regular rhythm and intact distal pulses.  No murmur heard. Pulmonary/Chest:  Effort normal and breath sounds normal. No respiratory distress. She has no wheezes. She has no rales.  Abdominal: Soft. She exhibits no distension. There is no tenderness. There is no rebound and no guarding.  Musculoskeletal: Normal range of motion. She exhibits no edema or tenderness.  Neurological: She is alert and oriented to person, place, and time.  Skin: Skin is warm and dry. No rash noted. No erythema.  Psychiatric: She has a normal mood and affect. Her behavior is normal.  Nursing note and vitals reviewed.    ED Treatments / Results  Labs (all labs ordered are listed, but only abnormal results are displayed) Labs Reviewed  BASIC METABOLIC PANEL - Abnormal; Notable for the following components:      Result Value   Glucose, Bld 120 (*)    All other components within normal limits  CBC  LIPASE, BLOOD  BRAIN NATRIURETIC PEPTIDE  I-STAT TROPONIN, ED    EKG  EKG Interpretation  Date/Time:  Sunday August 21 2017 11:18:48 EST Ventricular Rate:  70 PR Interval:  162 QRS Duration: 128 QT Interval:  420 QTC Calculation: 453 R Axis:   82 Text Interpretation:  Normal sinus rhythm Right bundle branch block No significant change since last tracing Confirmed by Blanchie Dessert [phone]) on 08/21/2017 12:39:48 PM       Radiology Dg Chest 2 View  Result Date: 08/21/2017 CLINICAL DATA:  Chest pain EXAM: CHEST  2 VIEW COMPARISON:  01/01/2017 and prior radiographs FINDINGS: The cardiomediastinal silhouette is unremarkable. Coronary stent identified. There is no evidence of focal airspace disease, pulmonary edema, suspicious pulmonary nodule/mass, pleural effusion, or pneumothorax. No acute bony abnormalities are identified. IMPRESSION: No active cardiopulmonary disease. Electronically Signed   By: Margarette Canada M.D.   On: 08/21/2017 11:49    Procedures Procedures (including critical care time)  Medications Ordered in ED Medications  ondansetron (ZOFRAN) injection 4 mg (4 mg  Intravenous Given 08/21/17 1301)  aspirin chewable tablet 162 mg (162 mg Oral Given 08/21/17 1301)     Initial Impression / Assessment and Plan / ED Course  I have reviewed the triage vital signs and the nursing notes.  Pertinent labs & imaging results that were available during my care of the patient were reviewed by me and considered in my medical decision making (see  chart for details).    Patient with prior cardiac history presenting today with general fatigue, intermittent atypical chest pain and shortness of breath.  Patient's x-ray, lab work, troponin and EKG without acute findings.  On exam patient does not have overt signs of fluid overload.  However patient could have new ACS.  She is still having ongoing nausea but has no abdominal pain concerning for acute abdominal pathology. Patient given 162 mg of aspirin because she had already taken some aspirin at home.  Currently had no chest pain.  Patient's story is not consistent with PE, dissection, pneumonia.  Will discuss with cardiology about admission and chest pain rule out.  Final Clinical Impressions(s) / ED Diagnoses   Final diagnoses:  Atypical chest pain  Fatigue, unspecified type    ED Discharge Orders    None       Blanchie Dessert, MD 08/21/17 1948

## 2017-08-21 NOTE — ED Triage Notes (Addendum)
Pt states she has just "felt sick" for a few days, with some nausea. Pt also has a cardiac hx with stent placement. States that for the past 2 days she has had left sided chest pain into the left breast/left chest, non radiating. Pt also has a runny nose with "sores " in her nares that she is already being treated for. Pt also wants [place] to know she has ocular transplants.

## 2017-08-21 NOTE — ED Notes (Signed)
Lavender top sent down for BNP

## 2017-08-21 NOTE — ED Notes (Signed)
Grill chick, bp and tea tray ordered

## 2017-08-21 NOTE — Consult Note (Signed)
Cardiology Consultation:   Patient ID: Hannah Vasquez; [id number]; 11-Aug-1951   Admit date: 08/21/2017 Date of Consult: 08/21/2017  Primary Care Provider: Lucille Passy, MD Primary Cardiologist: Dr Marlou Porch Primary Electrophysiologist:  n/a   Patient Profile:   Hannah Vasquez is a 67 y.o. female with a hx of CAD with prior stents to LAD who is being seen today for the evaluation of fatigue, nausea, and left sided rib pain at the request of Dr Jamse Arn.   History of Present Illness:   Hannah Vasquez 67 yo female history of CAD with prior NSTEMI 12/2016 s/p stents to LADx2, residual small ramus disease and jailed diag. She reports ongoing fatigue since her stent was placed in 12/2016. Overall just low energy, can have some SOB at times. Notes indicate changed from brillinta to effient due to SOB, does not appear to have helped. Over the last few days increased nausea. No vomiting, no diarrhea. Also with an intermittent left sided pain in her midribcage. 5/10 dull pain, can occur at rest or with exertion. Lasts about 1 mintue. No other associated symptoms. Not positional. Occurs daily.    K 3.9, Cr 0.81,WBC 9.4, Hgb  12.7, Plt 323,  Trop neg CXR no acute  EKG SR, chronic nonspecific ST/T changes    Past Medical History:  Diagnosis Date  . Allergy   . Anxiety   . CAD (coronary artery disease)   . Cataract   . Hemorrhoids   . Hypertension   . Mitral valve prolapse   . Osteoporosis   . Status post dilation of esophageal narrowing     Past Surgical History:  Procedure Laterality Date  . BASAL CELL CARCINOMA EXCISION  02/05/2016   Dr. Sarajane Jews, [facility]  . BLADDER SURGERY  11/15/1995   bladder neck suspension, urinary incontinence  . CARDIAC CATHETERIZATION  01/03/2017 DES LAD  . CATARACT EXTRACTION Left 04/30/2016   Dr. Tommy Rainwater, [place] N/A 01/03/2017   Procedure: Coronary Stent Intervention;  Surgeon: Nelva Bush, MD;  Location: [place] [facility];  Service: Cardiovascular;  Laterality: N/A;  . DENTAL SURGERY  06/20/2016   Tooth Implant; Dr. Kalman Shan  . INTRAVASCULAR ULTRASOUND/IVUS N/A 01/03/2017   Procedure: Intravascular Ultrasound/IVUS;  Surgeon: Nelva Bush, MD;  Location: [place] [facility];  Service: Cardiovascular;  Laterality: N/A;  . KNEE SURGERY  2003   torn meniscus  . LEFT HEART CATH AND CORONARY ANGIOGRAPHY N/A 01/03/2017   Procedure: Left Heart Cath and Coronary Angiography;  Surgeon: Nelva Bush, MD;  Location: [place] [facility];  Service: Cardiovascular;  Laterality: N/A;  . MOHS SURGERY Right 2017   right side of face  . SHOULDER SURGERY Right 2005      Inpatient Medications: Scheduled Meds:  Continuous Infusions:  PRN Meds:   Allergies:    Allergies  Allergen Reactions  . Codeine     REACTION: nausea and vomiting  . Penicillins Nausea And Vomiting    REACTION: rash    Social History:   Social History   Socioeconomic History  . Marital status: Divorced    Spouse name: Not on file  . Number of children: 2  . Years of education: Not on file  . Highest education level: Not on file  Social Needs  . Financial resource strain: Not on file  . Food insecurity - worry: Not on file  . Food insecurity - inability: Not on file  . Transportation needs - medical: Not on file  .  Transportation needs - non-medical: Not on file  Occupational History  . Occupation: Medical office---front desk    Comment: Retired  Tobacco Use  . Smoking status: Never Smoker  . Smokeless tobacco: Never Used  Substance and Sexual Activity  . Alcohol use: No    Alcohol/week: 0.0 oz  . Drug use: No  . Sexual activity: Yes  Other Topics Concern  . Not on file  Social History Narrative   Has a living will.   HPOA- daughter.   She would desire CPR.  Would desire life support if necessary and not futile.          Family History:    Family History  Problem Relation Age of Onset  . Mitral valve  prolapse Mother   . Hypertension Father      ROS:  Please see the history of present illness.  ROS  All other ROS reviewed and negative.     Physical Exam/Data:   Vitals:   08/21/17 1430 08/21/17 1500 08/21/17 1530 08/21/17 1600  BP: (!) 136/55 137/61 (!) 133/57 (!) 141/54  Pulse: (!) 51 (!) 51 (!) 50 (!) 55  Resp: 15 16 20 12   Temp:      TempSrc:      SpO2: 97% 99% 99% 94%  Weight:      Height:       No intake or output data in the 24 hours ending 08/21/17 1620 Filed Weights   08/21/17 1120  Weight: 143 lb (64.9 kg)   Body mass index is 26.16 kg/m.  General:  Well nourished, well developed, in no acute distress HEENT: normal Lymph: no adenopathy Neck: no JVD Endocrine:  No thryomegaly Cardiac:  normal S1, S2; RRR; no murmur  Lungs:  clear to auscultation bilaterally, no wheezing, rhonchi or rales  Abd: soft, nontender, no hepatomegaly  Ext: no edema Musculoskeletal:  No deformities, BUE and BLE strength normal and equal Skin: warm and dry  Neuro:  CNs 2-12 intact, no focal abnormalities noted Psych:  Normal affect    Laboratory Data:  Chemistry Recent Labs  Lab 08/21/17 1127  NA 138  K 3.9  CL 106  CO2 25  GLUCOSE 120*  BUN 12  CREATININE 0.81  CALCIUM 9.5  GFRNONAA >60  GFRAA >60  ANIONGAP 7    No results for input(s): PROT, ALBUMIN, AST, ALT, ALKPHOS, BILITOT in the last 168 hours. Hematology Recent Labs  Lab 08/21/17 1127  WBC 9.4  RBC 4.89  HGB 12.7  HCT 39.9  MCV 81.6  MCH 26.0  MCHC 31.8  RDW 14.7  PLT 323   Cardiac EnzymesNo results for input(s): TROPONINI in the last 168 hours.  Recent Labs  Lab 08/21/17 1154  TROPIPOC 0.00    BNPNo results for input(s): BNP, PROBNP in the last 168 hours.  DDimer No results for input(s): DDIMER in the last 168 hours.  Radiology/Studies:  Dg Chest 2 View  Result Date: 08/21/2017 CLINICAL DATA:  Chest pain EXAM: CHEST  2 VIEW COMPARISON:  01/01/2017 and prior radiographs FINDINGS: The  cardiomediastinal silhouette is unremarkable. Coronary stent identified. There is no evidence of focal airspace disease, pulmonary edema, suspicious pulmonary nodule/mass, pleural effusion, or pneumothorax. No acute bony abnormalities are identified. IMPRESSION: No active cardiopulmonary disease. Electronically Signed   By: Margarette Canada M.D.   On: 08/21/2017 11:49    Assessment and Plan:   1. Fatigue/Nausea/Left rib pain - nonspecific symptoms of generalized fatigue, some SOB, nausea, left sided rib pain. Not  overally consistent with cardiac etiology, however given her prior history must consider atypical cardiac symptoms. Initial EKG and enzymes are unremarkable. Would cylce overnight. Low suspicion for cardiac ischemia, but please make npo at midnight in case further testing is needed.  - please lower TOprol XL to 85m daily for ongoing fatigue since her stents, symptoms did not improve changing brillinta to effient. F/u TSH  2. CAD - continue current meds. Cycle enzyme and EKGs overnight. Overall nonspecific symptoms as described above   Please admit to medicine service to consider alternative etiologies for her nonspecific systemic symptoms, we will follow as consult from cardiac standpoint.   For questions or updates, please contact [place]Please consult [email or web address] for contact info under Cardiology/STEMI.   SMerrily Pew MD  08/21/2017 4:20 PM

## 2017-08-21 NOTE — H&P (Signed)
History and Physical:    Hannah Vasquez   CWU:[id number] DOB: December 07, 1950 DOA: 08/21/2017  Referring MD/provider:   Dr Maryan Rued PCP: Lucille Passy, MD   Patient coming from: Home  Chief Complaint: Fatigue  History of Present Illness:   Hannah Vasquez is an 67 y.o. female with a past medical history significant for coronary artery disease status post MI with stenting in June 2018 who states that she has been tired since her MI. Patient states that she has never really recovered her baseline health since July. States that she is constantly fatigued and is intermittently short of breath. Even though she is admitted for increasing dyspnea on exertion per ER note, patient states that she does not reliably have dyspnea either at rest on exertion but that she is just tired all the time. Patient denies headache, syncope, palpitations, presyncope, fevers, upper extremity weakness, goiter, tender points and back. Patient just states that she is tired all the time.   The history that I got from the patient is somewhat different from that obtained from the ER. Patient to me denies reliable worsening of her exercise tolerance. She denies reliable shortness of breath or DOE. She tells me that she is just fatigued all the time. Patient states that she was able to walk back and forth from the mailbox yesterday without shortness of breath but that she was tired at the end of it. Patient denies orthopnea or PND. She denies cough, sputum production or fevers. Patient denies any bright red blood per rectum or hematemesis or any other known signs of blood loss.  Review of systems is positive for intermittent tingling in her left chest area which has been present for several months. This is not reliably reproducible with either rest or with exertion. Cannot reproduce it with moving her torso or with palpation.  ED Course:  The patient was admitted for following cardiac enzymes given atypical chest discomfort in a  patient who recently had an MI less than a year ago.  ROS:   ROS   Review of Systems: General: No fever, chills, weight changes Skin: No rashes, lesions, wounds Eyes: no discharge, redness, pain HENT: no ear pain, hearing loss, drainage, tinnitus Endocrine: no heat/cold intolerance, no polyuria Respiratory: Positive cough,, shortness of breath, hemoptysis Cardiovascular: No palpitations,  GI: No nausea, vomiting, diarrhea, constipation GU: No dysuria, increased frequency CNS: No numbness, dizziness, headache Musculoskeletal: No back pain, joint pain Blood/lymphatics: No easy bruising, bleeding Mood/affect: No anxiety/depression    Past Medical History:   Past Medical History:  Diagnosis Date  . Allergy   . Anxiety   . CAD (coronary artery disease)   . Cataract   . Hemorrhoids   . Hypertension   . Mitral valve prolapse   . Osteoporosis   . Status post dilation of esophageal narrowing     Past Surgical History:   Past Surgical History:  Procedure Laterality Date  . BASAL CELL CARCINOMA EXCISION  02/05/2016   Dr. Sarajane Jews, [facility]  . BLADDER SURGERY  11/15/1995   bladder neck suspension, urinary incontinence  . CARDIAC CATHETERIZATION  01/03/2017 DES LAD  . CATARACT EXTRACTION Left 04/30/2016   Dr. Tommy Rainwater, [place] N/A 01/03/2017   Procedure: Coronary Stent Intervention;  Surgeon: Nelva Bush, MD;  Location: [place] [facility];  Service: Cardiovascular;  Laterality: N/A;  . DENTAL SURGERY  06/20/2016   Tooth Implant; Dr. Kalman Shan  . INTRAVASCULAR ULTRASOUND/IVUS N/A 01/03/2017  Procedure: Intravascular Ultrasound/IVUS;  Surgeon: Nelva Bush, MD;  Location: [place] [facility];  Service: Cardiovascular;  Laterality: N/A;  . KNEE SURGERY  2003   torn meniscus  . LEFT HEART CATH AND CORONARY ANGIOGRAPHY N/A 01/03/2017   Procedure: Left Heart Cath and Coronary Angiography;  Surgeon: Nelva Bush, MD;   Location: [place] [facility];  Service: Cardiovascular;  Laterality: N/A;  . MOHS SURGERY Right 2017   right side of face  . SHOULDER SURGERY Right 2005    Social History:   Social History   Socioeconomic History  . Marital status: Divorced    Spouse name: Not on file  . Number of children: 2  . Years of education: Not on file  . Highest education level: Not on file  Social Needs  . Financial resource strain: Not on file  . Food insecurity - worry: Not on file  . Food insecurity - inability: Not on file  . Transportation needs - medical: Not on file  . Transportation needs - non-medical: Not on file  Occupational History  . Occupation: Medical office---front desk    Comment: Retired  Tobacco Use  . Smoking status: Never Smoker  . Smokeless tobacco: Never Used  Substance and Sexual Activity  . Alcohol use: No    Alcohol/week: 0.0 oz  . Drug use: No  . Sexual activity: Yes  Other Topics Concern  . Not on file  Social History Narrative   Has a living will.   HPOA- daughter.   She would desire CPR.  Would desire life support if necessary and not futile.          Allergies   Codeine and Penicillins  Family history:   Family History  Problem Relation Age of Onset  . Mitral valve prolapse Mother   . Hypertension Father     Current Medications:   Prior to Admission medications   Medication Sig Start Date End Date Taking? Authorizing Provider  albuterol (PROVENTIL HFA;VENTOLIN HFA) 108 (90 Base) MCG/ACT inhaler Inhale 1 puff into the lungs every 6 (six) hours as needed for wheezing or shortness of breath.   Yes [provider]  aspirin EC 81 MG tablet Take 81 mg by mouth every morning.    Yes [provider]  atorvastatin (LIPITOR) 80 MG tablet Take 1 tablet (80 mg total) by mouth daily at 6 PM. 04/25/17  Yes Duke, Tami Lin, PA  Cholecalciferol (VITAMIN D3) 1000 UNITS CAPS Take 1 capsule by mouth every morning.    Yes [provider]   furosemide (LASIX) 20 MG tablet Take 20 mg daily as needed by mouth for edema. 03/26/17  Yes [provider]  isosorbide mononitrate (IMDUR) 30 MG 24 hr tablet Take 1 tablet (30 mg total) by mouth daily. 02/07/17  Yes Burtis Junes, NP  loratadine (CLARITIN) 10 MG tablet Take 1 tablet (10 mg total) by mouth every morning. 11/08/13  Yes Lucille Passy, MD  losartan (COZAAR) 50 MG tablet Take 50 mg daily by mouth.   Yes [provider]  metoprolol succinate (TOPROL-XL) 50 MG 24 hr tablet TAKE 1 TABLET BY MOUTH ONCE A DAY WITH OR IMMEDIATELY FOLLOWING A MEAL 07/18/17  Yes Jerline Pain, MD  Multiple Vitamin (MULTIVITAMIN) tablet Take 1 tablet by mouth every morning.    Yes [provider]  Omega-3 Fatty Acids (FISH OIL) 1200 MG CAPS Take 1 capsule by mouth every morning.    Yes [provider]  omeprazole ([place])  40 MG capsule Take 40 mg by mouth daily.  05/21/16  Yes [provider]  potassium chloride (K-DUR) 10 MEQ tablet Take 10 mEq daily as needed by mouth. If taking Furosemide (Lasix) 04/22/17  Yes [provider]  prasugrel (EFFIENT) 10 MG TABS tablet Take 10 mg by mouth daily.   Yes [provider]  traMADol (ULTRAM) 50 MG tablet Take 1 tablet (50 mg total) by mouth every 8 (eight) hours as needed (pain). 08/02/17  Yes Venia Carbon, MD  valACYclovir (VALTREX) 1000 MG tablet Take 2 g by mouth 2 (two) times daily. 08/02/17  Yes [provider]  nitroGLYCERIN (NITROSTAT) 0.4 MG SL tablet Place 1 tablet (0.4 mg total) under the tongue every 5 (five) minutes as needed for chest pain. 01/04/17   Ledora Bottcher, PA    Physical Exam:   Vitals:   08/21/17 1430 08/21/17 1500 08/21/17 1530 08/21/17 1600  BP: (!) 136/55 137/61 (!) 133/57 (!) 141/54  Pulse: (!) 51 (!) 51 (!) 50 (!) 55  Resp: 15 16 20 12   Temp:      TempSrc:      SpO2: 97% 99% 99% 94%  Weight:      Height:         Physical Exam: Blood pressure (!)  141/54, pulse (!) 55, temperature 98.7 F (37.1 C), temperature source Oral, resp. rate 12, height 5' 2"  (1.575 m), weight 64.9 kg (143 lb), SpO2 94 %. Gen: Will appeared well-nourished and well-groomed female lying in bed with daughter at bedside. Eyes: Sclerae anicteric. Conjunctiva mildly injected. Neck: Supple, no jugular venous distention. Chest: Moderately good air entry bilaterally with no adventitious sounds.  CV: Distant, regular, no audible murmurs. Abdomen: NABS, soft, nondistended, nontender. No tenderness to light or deep palpation. No rebound, no guarding. Extremities: No edema.  Skin: Warm and dry. No rashes, lesions or wounds. Neuro: Alert and oriented times 3; grossly nonfocal. Psych: Patient is cooperative, logical and coherent with appropriate mood and affect.   Data Review:    Labs: Basic Metabolic Panel: Recent Labs  Lab 08/21/17 1127  NA 138  K 3.9  CL 106  CO2 25  GLUCOSE 120*  BUN 12  CREATININE 0.81  CALCIUM 9.5   Liver Function Tests: No results for input(s): AST, ALT, ALKPHOS, BILITOT, PROT, ALBUMIN in the last 168 hours. Recent Labs  Lab 08/21/17 1127  LIPASE 34   No results for input(s): AMMONIA in the last 168 hours. CBC: Recent Labs  Lab 08/21/17 1127  WBC 9.4  HGB 12.7  HCT 39.9  MCV 81.6  PLT 323   Cardiac Enzymes: No results for input(s): CKTOTAL, CKMB, CKMBINDEX, TROPONINI in the last 168 hours.  BNP (last 3 results) Recent Labs    01/12/17 1232 01/24/17 1504  PROBNP 417* 310*   CBG: No results for input(s): GLUCAP in the last 168 hours.  Urinalysis    Component Value Date/Time   COLORURINE AMBER BIOCHEMICALS MAY BE AFFECTED BY COLOR (A) 01/30/2010 0346   APPEARANCEUR CLOUDY (A) 01/30/2010 0346   LABSPEC 1.025 01/30/2010 0346   PHURINE 5.5 01/30/2010 0346   GLUCOSEU NEGATIVE 01/30/2010 0346   HGBUR NEGATIVE 01/30/2010 0346   HGBUR large 06/19/2009 1041   BILIRUBINUR neg 04/21/2017 1043   KETONESUR NEGATIVE  01/30/2010 0346   PROTEINUR neg 04/21/2017 1043   PROTEINUR NEGATIVE 01/30/2010 0346   UROBILINOGEN 0.2 04/21/2017 1043   UROBILINOGEN 1.0 01/30/2010 0346   NITRITE positive 04/21/2017 1043   NITRITE  NEGATIVE 01/30/2010 0346   LEUKOCYTESUR Small (1+) (A) 04/21/2017 1043      Radiographic Studies: Dg Chest 2 View  Result Date: 08/21/2017 CLINICAL DATA:  Chest pain EXAM: CHEST  2 VIEW COMPARISON:  01/01/2017 and prior radiographs FINDINGS: The cardiomediastinal silhouette is unremarkable. Coronary stent identified. There is no evidence of focal airspace disease, pulmonary edema, suspicious pulmonary nodule/mass, pleural effusion, or pneumothorax. No acute bony abnormalities are identified. IMPRESSION: No active cardiopulmonary disease. Electronically Signed   By: Margarette Canada M.D.   On: 08/21/2017 11:49    EKG: Independently reviewed. Sinus rhythm at 60 with stable right bundle branch block without change or any acute ST-T wave changes.  Assessment/Plan:   Principal Problem:   Fatigue Active Problems:   Coronary atherosclerosis   DOE (dyspnea on exertion)  CHEST PAIN Patient is admitted for trending of cardiac enzymes given atypical chest pain in patient with known coronary artery disease and stent in the past year. EKG is without any changes and her history is not very typical for angina. Will continue rule out MI per ER protocol.  CAD Continue patient's home medications of metoprolol xl, losartan, Lasix, Imdur and prasugrel   FATIGUE Patient with fatigue since MI 8 months ago. She denies DOE or real shortness of breath more just fatigue. I have no clear etiology of her fatigue as her H&H are within normal limits. Will check an ESR although I don't believe she's got PMR. I suspect her multiple new cardiac medications that were added after her MI may well be playing a part in this. 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   Other information:   DVT prophylaxis: Lovenox ordered. Code Status: Full code. Family Communication: Daughter at bedside  Disposition Plan: Home Consults called: None Admission status: Observation   The medical decision making is of moderate complexity, therefore this is a level 2 visit.  Dewaine Oats Derek Jack [facility] Pager [phone] Cell: [phone]   If 7PM-7AM, please contact night-coverage [email or web address] Password [email or web address] 08/21/2017, 6:18 PM

## 2017-08-22 ENCOUNTER — Ambulatory Visit: Payer: PPO | Admitting: Internal Medicine

## 2017-08-22 ENCOUNTER — Telehealth: Payer: Self-pay | Admitting: *Deleted

## 2017-08-22 DIAGNOSIS — R079 Chest pain, unspecified: Secondary | ICD-10-CM

## 2017-08-22 DIAGNOSIS — R0789 Other chest pain: Secondary | ICD-10-CM

## 2017-08-22 DIAGNOSIS — R0609 Other forms of dyspnea: Secondary | ICD-10-CM | POA: Diagnosis not present

## 2017-08-22 DIAGNOSIS — R5383 Other fatigue: Secondary | ICD-10-CM | POA: Diagnosis not present

## 2017-08-22 LAB — MRSA PCR SCREENING: MRSA by PCR: NEGATIVE

## 2017-08-22 LAB — TSH: TSH: 2.147 u[IU]/mL (ref 0.350–4.500)

## 2017-08-22 LAB — URINALYSIS, ROUTINE W REFLEX MICROSCOPIC
Bilirubin Urine: NEGATIVE
GLUCOSE, UA: NEGATIVE mg/dL
Hgb urine dipstick: NEGATIVE
Ketones, ur: NEGATIVE mg/dL
Leukocytes, UA: NEGATIVE
NITRITE: NEGATIVE
PH: 7 (ref 5.0–8.0)
Protein, ur: NEGATIVE mg/dL
SPECIFIC GRAVITY, URINE: 1.006 (ref 1.005–1.030)

## 2017-08-22 LAB — TROPONIN I: Troponin I: <0.03 ng/mL (ref <0.03)

## 2017-08-22 MED ORDER — LOSARTAN POTASSIUM 50 MG PO TABS
50.0000 mg | ORAL_TABLET | Freq: Every day | ORAL | Status: DC
  Administered 2017-08-22: 50 mg via ORAL
  Filled 2017-08-22: qty 1

## 2017-08-22 MED ORDER — FUROSEMIDE 20 MG PO TABS: 20.0000 mg | ORAL_TABLET | Freq: Every day | ORAL | Status: DC | PRN

## 2017-08-22 MED ORDER — METOPROLOL SUCCINATE ER 25 MG PO TB24: 25.0000 mg | ORAL_TABLET | Freq: Every day | ORAL | 0 refills | Status: DC

## 2017-08-22 MED ORDER — METOPROLOL SUCCINATE ER 25 MG PO TB24
25.0000 mg | ORAL_TABLET | Freq: Every day | ORAL | Status: DC
  Administered 2017-08-22: 25 mg via ORAL
  Filled 2017-08-22 (×2): qty 1

## 2017-08-22 MED ORDER — PANTOPRAZOLE SODIUM 40 MG PO TBEC
40.0000 mg | DELAYED_RELEASE_TABLET | Freq: Every day | ORAL | Status: DC
  Administered 2017-08-22: 40 mg via ORAL
  Filled 2017-08-22: qty 1

## 2017-08-22 MED ORDER — TRAMADOL HCL 50 MG PO TABS: 50.0000 mg | ORAL_TABLET | Freq: Three times a day (TID) | ORAL | Status: DC | PRN

## 2017-08-22 MED ORDER — ONDANSETRON HCL 4 MG/2ML IJ SOLN: 4.0000 mg | Freq: Four times a day (QID) | INTRAMUSCULAR | Status: DC | PRN

## 2017-08-22 MED ORDER — NITROGLYCERIN 0.4 MG SL SUBL: 0.4000 mg | SUBLINGUAL_TABLET | SUBLINGUAL | Status: DC | PRN

## 2017-08-22 MED ORDER — POTASSIUM CHLORIDE CRYS ER 10 MEQ PO TBCR
10.0000 meq | EXTENDED_RELEASE_TABLET | Freq: Every day | ORAL | Status: DC
  Administered 2017-08-22: 10 meq via ORAL
  Filled 2017-08-22: qty 1

## 2017-08-22 MED ORDER — METOPROLOL SUCCINATE ER 50 MG PO TB24: 50.0000 mg | ORAL_TABLET | Freq: Every day | ORAL | Status: DC

## 2017-08-22 MED ORDER — ISOSORBIDE MONONITRATE ER 30 MG PO TB24
30.0000 mg | ORAL_TABLET | Freq: Every day | ORAL | Status: DC
  Administered 2017-08-22: 30 mg via ORAL
  Filled 2017-08-22: qty 1

## 2017-08-22 MED ORDER — ATORVASTATIN CALCIUM 80 MG PO TABS: 80.0000 mg | ORAL_TABLET | Freq: Every day | ORAL | Status: DC

## 2017-08-22 MED ORDER — PRASUGREL HCL 10 MG PO TABS
10.0000 mg | ORAL_TABLET | Freq: Every day | ORAL | Status: DC
  Administered 2017-08-22: 10 mg via ORAL
  Filled 2017-08-22: qty 1

## 2017-08-22 MED ORDER — VALACYCLOVIR HCL 500 MG PO TABS
2000.0000 mg | ORAL_TABLET | Freq: Two times a day (BID) | ORAL | Status: DC
  Filled 2017-08-22: qty 4

## 2017-08-22 MED ORDER — ACETAMINOPHEN 325 MG PO TABS: 650.0000 mg | ORAL_TABLET | ORAL | Status: DC | PRN

## 2017-08-22 MED ORDER — VITAMIN D 1000 UNITS PO TABS
1000.0000 [IU] | ORAL_TABLET | Freq: Every morning | ORAL | Status: DC
  Administered 2017-08-22: 1000 [IU] via ORAL
  Filled 2017-08-22: qty 1

## 2017-08-22 MED ORDER — ASPIRIN EC 81 MG PO TBEC
81.0000 mg | DELAYED_RELEASE_TABLET | Freq: Every morning | ORAL | Status: DC
  Administered 2017-08-22: 81 mg via ORAL
  Filled 2017-08-22: qty 1

## 2017-08-22 MED ORDER — ADULT MULTIVITAMIN W/MINERALS CH
1.0000 | ORAL_TABLET | Freq: Every morning | ORAL | Status: DC
  Administered 2017-08-22: 1 via ORAL
  Filled 2017-08-22: qty 1

## 2017-08-22 MED ORDER — ALBUTEROL SULFATE HFA 108 (90 BASE) MCG/ACT IN AERS: 1.0000 | INHALATION_SPRAY | Freq: Four times a day (QID) | RESPIRATORY_TRACT | Status: DC | PRN

## 2017-08-22 NOTE — ED Notes (Signed)
Blood collected for tsh and troponin test.

## 2017-08-22 NOTE — ED Notes (Signed)
Patient ok for Discharge per Dr. Eliseo Squires. MRSA PCR result still pending; MD will follow-up with patient regarding results.

## 2017-08-22 NOTE — Telephone Encounter (Signed)
Copied from [place]. Topic: Quick Communication - Appointment Cancellation >> Aug 22, 2017  8:17 AM Scherrie Gerlach wrote: Patient called to cancel appointment scheduled for 10 am today Patient  HAS NOT: rescheduled their appointment. pt in the hospital  Route to department's PEC pool.  Do you want a late cancellation fee charged? Does appointment need to be rescheduled?

## 2017-08-22 NOTE — Discharge Summary (Addendum)
Physician Discharge Summary  Hannah Vasquez QJF:[id number] DOB: 09-03-1950 DOA: 08/21/2017  PCP: Venia Carbon, MD  Admit date: 08/21/2017 Discharge date: 08/22/2017   Recommendations for Outpatient Follow-Up:   1. Consider OSA eval as outpatient 2. MRSA screen pending for nasal wounds   Discharge Diagnosis:   Principal Problem:   Fatigue Active Problems:   Coronary atherosclerosis   DOE (dyspnea on exertion)   Discharge disposition:  Home  Discharge Condition: Improved.  Diet recommendation: Low sodium, heart healthy  Wound care: None.   History of Present Illness:   Hannah Vasquez is an 67 y.o. female with a past medical history significant for coronary artery disease status post MI with stenting in June 2018 who states that she has been tired since her MI. Patient states that she has never really recovered her baseline health since July. States that she is constantly fatigued and is intermittently short of breath. Even though she is admitted for increasing dyspnea on exertion per ER note, patient states that she does not reliably have dyspnea either at rest on exertion but that she is just tired all the time. Patient denies headache, syncope, palpitations, presyncope, fevers, upper extremity weakness, goiter, tender points and back. Patient just states that she is tired all the time.   The history that I got from the patient is somewhat different from that obtained from the ER. Patient to me denies reliable worsening of her exercise tolerance. She denies reliable shortness of breath or DOE. She tells me that she is just fatigued all the time. Patient states that she was able to walk back and forth from the mailbox yesterday without shortness of breath but that she was tired at the end of it. Patient denies orthopnea or PND. She denies cough, sputum production or fevers. Patient denies any bright red blood per rectum or hematemesis or any other known signs of blood  loss.  Review of systems is positive for intermittent tingling in her left chest area which has been present for several months. This is not reliably reproducible with either rest or with exertion. Cannot reproduce it with moving her torso or with palpation.  ED Course:  The patient was admitted for following cardiac enzymes given atypical chest discomfort in a patient who recently had an MI less than a year ago.     Hospital Course by Problem:   CHEST PAIN:    Enzymes negative.  Atypical pain.  No further cardiac work up -outpatient follow up with Dr. Marlou Porch  FATIGUE:   -Beta blocker reduced by cards -? OSA-- outpatient follow up -no sign of infection -resume home work outs  Nasal lesions -d/c valtrex as can cause fatigue -MRSA swab pending (derm did one that was reportedly negative but rechecked here)    Medical Consultants:    cards   Discharge Exam:   Vitals:   08/22/17 1200 08/22/17 1230  BP: (!) 150/52 (!) 126/49  Pulse: 65 63  Resp: (!) 21 12  Temp:    SpO2: 95% 95%   Vitals:   08/22/17 1129 08/22/17 1130 08/22/17 1200 08/22/17 1230  BP: (!) 150/61 (!) 162/58 (!) 150/52 (!) 126/49  Pulse: 72 72 65 63  Resp:  18 (!) 21 12  Temp:      TempSrc:      SpO2:  97% 95% 95%  Weight:      Height:        Gen:  NAD    The results of significant  diagnostics from this hospitalization (including imaging, microbiology, ancillary and laboratory) are listed below for reference.     Procedures and Diagnostic Studies:   Dg Chest 2 View  Result Date: 08/21/2017 CLINICAL DATA:  Chest pain EXAM: CHEST  2 VIEW COMPARISON:  01/01/2017 and prior radiographs FINDINGS: The cardiomediastinal silhouette is unremarkable. Coronary stent identified. There is no evidence of focal airspace disease, pulmonary edema, suspicious pulmonary nodule/mass, pleural effusion, or pneumothorax. No acute bony abnormalities are identified. IMPRESSION: No active cardiopulmonary disease.  Electronically Signed   By: Margarette Canada M.D.   On: 08/21/2017 11:49     Labs:   Basic Metabolic Panel: Recent Labs  Lab 08/21/17 1127  NA 138  K 3.9  CL 106  CO2 25  GLUCOSE 120*  BUN 12  CREATININE 0.81  CALCIUM 9.5   GFR Estimated Creatinine Clearance: 58.8 mL/min (by C-G formula based on SCr of 0.81 mg/dL). Liver Function Tests: No results for input(s): AST, ALT, ALKPHOS, BILITOT, PROT, ALBUMIN in the last 168 hours. Recent Labs  Lab 08/21/17 1127  LIPASE 34   No results for input(s): AMMONIA in the last 168 hours. Coagulation profile No results for input(s): INR, PROTIME in the last 168 hours.  CBC: Recent Labs  Lab 08/21/17 1127  WBC 9.4  HGB 12.7  HCT 39.9  MCV 81.6  PLT 323   Cardiac Enzymes: Recent Labs  Lab 08/21/17 1945 08/22/17 0046 08/22/17 0302 08/22/17 0602  TROPONINI <0.03 <0.03 <0.03 <0.03   BNP: Invalid input(s): POCBNP CBG: No results for input(s): GLUCAP in the last 168 hours. D-Dimer No results for input(s): DDIMER in the last 72 hours. Hgb A1c No results for input(s): HGBA1C in the last 72 hours. Lipid Profile No results for input(s): CHOL, HDL, LDLCALC, TRIG, CHOLHDL, LDLDIRECT in the last 72 hours. Thyroid function studies Recent Labs    08/21/17 1700  TSH 2.147   Anemia work up No results for input(s): VITAMINB12, FOLATE, FERRITIN, TIBC, IRON, RETICCTPCT in the last 72 hours. Microbiology No results found for this or any previous visit (from the past 240 hour(s)).   Discharge Instructions:   Discharge Instructions    Diet - low sodium heart healthy   Complete by:  As directed    Increase activity slowly   Complete by:  As directed      Allergies as of 08/22/2017      Reactions   Codeine    REACTION: nausea and vomiting   Penicillins Nausea And Vomiting   REACTION: rash      Medication List    STOP taking these medications   valACYclovir 1000 MG tablet Commonly known as:  VALTREX     TAKE these  medications   albuterol 108 (90 Base) MCG/ACT inhaler Commonly known as:  PROVENTIL HFA;VENTOLIN HFA Inhale 1 puff into the lungs every 6 (six) hours as needed for wheezing or shortness of breath.   aspirin EC 81 MG tablet Take 81 mg by mouth every morning.   atorvastatin 80 MG tablet Commonly known as:  LIPITOR Take 1 tablet (80 mg total) by mouth daily at 6 PM.   Fish Oil 1200 MG Caps Take 1 capsule by mouth every morning.   furosemide 20 MG tablet Commonly known as:  LASIX Take 20 mg daily as needed by mouth for edema.   isosorbide mononitrate 30 MG 24 hr tablet Commonly known as:  IMDUR Take 1 tablet (30 mg total) by mouth daily.   loratadine 10 MG tablet Commonly  known as:  CLARITIN Take 1 tablet (10 mg total) by mouth every morning.   losartan 50 MG tablet Commonly known as:  COZAAR Take 50 mg daily by mouth.   metoprolol succinate 25 MG 24 hr tablet Commonly known as:  TOPROL-XL Take 1 tablet (25 mg total) by mouth daily. Start taking on:  08/23/2017 What changed:    medication strength  how much to take  how to take this  when to take this  additional instructions   multivitamin tablet Take 1 tablet by mouth every morning.   nitroGLYCERIN 0.4 MG SL tablet Commonly known as:  NITROSTAT Place 1 tablet (0.4 mg total) under the tongue every 5 (five) minutes as needed for chest pain.   omeprazole 40 MG capsule Commonly known as:  PRILOSEC Take 40 mg by mouth daily.   potassium chloride 10 MEQ tablet Commonly known as:  K-DUR Take 10 mEq daily as needed by mouth. If taking Furosemide (Lasix)   prasugrel 10 MG Tabs tablet Commonly known as:  EFFIENT Take 10 mg by mouth daily.   traMADol 50 MG tablet Commonly known as:  ULTRAM Take 1 tablet (50 mg total) by mouth every 8 (eight) hours as needed (pain).   Vitamin D3 1000 units Caps Take 1 capsule by mouth every morning.      Follow-up Information    Jerline Pain, MD Follow up.   Specialty:   Cardiology Contact information: [address] [place] [address] [phone]        Viviana Simpler I, MD Follow up in 1 week(s).   Specialties:  Internal Medicine, Pediatrics Contact information: [place] [address] [phone]            Time coordinating discharge: 35 min  Signed:  Geradine Girt   [facility] 08/22/2017, 2:13 PM

## 2017-08-22 NOTE — Progress Notes (Signed)
Progress Note  Patient Name: Hannah Vasquez Date of Encounter: 08/22/2017  Primary Cardiologist:   Candee Furbish, MD   Subjective   Still with some mild atypical left sided pain not like previous angina.   Inpatient Medications    Scheduled Meds: . aspirin EC  81 mg Oral q morning - 10a  . atorvastatin  80 mg Oral q1800  . cholecalciferol  1,000 Units Oral q morning - 10a  . isosorbide mononitrate  30 mg Oral Daily  . losartan  50 mg Oral Daily  . metoprolol succinate  50 mg Oral Daily  . multivitamin with minerals  1 tablet Oral q morning - 10a  . pantoprazole  40 mg Oral Daily  . potassium chloride  10 mEq Oral Daily  . prasugrel  10 mg Oral Daily   Continuous Infusions:  PRN Meds: acetaminophen, albuterol, furosemide, nitroGLYCERIN, ondansetron (ZOFRAN) IV, traMADol   Vital Signs    Vitals:   08/22/17 0815 08/22/17 0900 08/22/17 0930 08/22/17 1000  BP: (!) 168/43 108/63 (!) 158/69 (!) 155/65  Pulse: (!) 55 (!) 53 (!) 55 61  Resp: 16 11 14 17   Temp: 98.2 F (36.8 C)     TempSrc: Oral     SpO2: 98% 100% 99% 98%  Weight:      Height:       No intake or output data in the 24 hours ending 08/22/17 1057 Filed Weights   08/21/17 1120  Weight: 143 lb (64.9 kg)    Telemetry    NA - Personally Reviewed  ECG    NA - Personally Reviewed  Physical Exam   GEN: No acute distress.   Neck: No  JVD Cardiac: RRR, no murmurs, rubs, or gallops.  Respiratory: Clear  to auscultation bilaterally. GI: Soft, nontender, non-distended  MS: No  edema; No deformity. Neuro:  Nonfocal  Psych: Normal affect   Labs    Chemistry Recent Labs  Lab 08/21/17 1127  NA 138  K 3.9  CL 106  CO2 25  GLUCOSE 120*  BUN 12  CREATININE 0.81  CALCIUM 9.5  GFRNONAA >60  GFRAA >60  ANIONGAP 7     Hematology Recent Labs  Lab 08/21/17 1127  WBC 9.4  RBC 4.89  HGB 12.7  HCT 39.9  MCV 81.6  MCH 26.0  MCHC 31.8  RDW 14.7  PLT 323    Cardiac Enzymes Recent Labs  Lab  08/21/17 1945 08/22/17 0046 08/22/17 0302 08/22/17 0602  TROPONINI <0.03 <0.03 <0.03 <0.03    Recent Labs  Lab 08/21/17 1154  TROPIPOC 0.00     BNP Recent Labs  Lab 08/21/17 2058  BNP 31.2     DDimer No results for input(s): DDIMER in the last 168 hours.   Radiology    Dg Chest 2 View  Result Date: 08/21/2017 CLINICAL DATA:  Chest pain EXAM: CHEST  2 VIEW COMPARISON:  01/01/2017 and prior radiographs FINDINGS: The cardiomediastinal silhouette is unremarkable. Coronary stent identified. There is no evidence of focal airspace disease, pulmonary edema, suspicious pulmonary nodule/mass, pleural effusion, or pneumothorax. No acute bony abnormalities are identified. IMPRESSION: No active cardiopulmonary disease. Electronically Signed   By: Margarette Canada M.D.   On: 08/21/2017 11:49    Cardiac Studies   None  Patient Profile     67 y.o. female with a hx of CAD with prior stents to LAD who is being seen for the evaluation of fatigue, nausea, and left sided rib pain at the request  of Dr Jamse Arn.    Assessment & Plan    CHEST PAIN:    Enzymes negative.  Atypical pain.  No further cardiac work up.  Call with further questions.   FATIGUE:  Beta blocker reduced.  She can follow up with Dr. Marlou Porch.    For questions or updates, please contact [place] Please consult [email or web address] for contact info under Cardiology/STEMI.   Signed, Minus Breeding, MD  08/22/2017, 10:57 AM

## 2017-08-22 NOTE — Telephone Encounter (Signed)
Obviously no charge since she is in the hospital Will need follow up upon discharge

## 2017-08-23 ENCOUNTER — Telehealth: Payer: Self-pay | Admitting: *Deleted

## 2017-08-23 NOTE — Telephone Encounter (Signed)
Lm requesting return call to complete TCM and confirm hosp f/u appt

## 2017-08-24 ENCOUNTER — Ambulatory Visit: Payer: PPO | Admitting: Cardiology

## 2017-08-24 ENCOUNTER — Encounter: Payer: Self-pay | Admitting: Cardiology

## 2017-08-24 VITALS — BP 124/78 | HR 80 | Ht 62.0 in | Wt 143.1 lb

## 2017-08-24 DIAGNOSIS — I2583 Coronary atherosclerosis due to lipid rich plaque: Secondary | ICD-10-CM | POA: Diagnosis not present

## 2017-08-24 DIAGNOSIS — I2510 Atherosclerotic heart disease of native coronary artery without angina pectoris: Secondary | ICD-10-CM | POA: Diagnosis not present

## 2017-08-24 DIAGNOSIS — I10 Essential (primary) hypertension: Secondary | ICD-10-CM

## 2017-08-24 DIAGNOSIS — R0602 Shortness of breath: Secondary | ICD-10-CM | POA: Diagnosis not present

## 2017-08-24 DIAGNOSIS — R0609 Other forms of dyspnea: Secondary | ICD-10-CM

## 2017-08-24 DIAGNOSIS — R5383 Other fatigue: Secondary | ICD-10-CM | POA: Diagnosis not present

## 2017-08-24 NOTE — Telephone Encounter (Signed)
Lm requesting return call to complete TCM and confirm hosp f/u appt

## 2017-08-24 NOTE — Progress Notes (Signed)
[facility] [address] [place], [place]  [address] Phone: [phone] Fax:  [phone]   Date:  08/24/2017   ID:  Hannah Vasquez, DOB [birth year], MRN [id number]  PCP:  Venia Carbon, MD   History of Present Illness: Hannah Vasquez is a 67 y.o. female here for clinic follow up with history of ST elevation myocardial infarction on 01/01/17 stating that she had chest pressure that waxed and waned then returned, troponin was 0.16. She went to the Cath Lab and received 2 stents to the LAD and showed an EF of 40-45%.Residual small ramus dz and jailed diag.   Previous visits: 9/13 - shingles then PNA. Said she had shingles all over?. Started on leg. A doctor at the hospital when she was visiting her father turned to her and asked her what was wrong. He then got a second opinion and told her that she had shingles. 5/14 - back pain, injections, PT 3/15 - feeling so out of breath, walking from here to door. Fatigue.  Previously out of breath. No chest pain. At times feels chest tightness. When SOB happens, sometimes just talking at rest, needs to stop and take a deep breath. Felt winded.   08/24/17 -Was hospitalized and recently discharged on 08/22/17 for atypical chest pain, fatigue, SOB.  Dr. Harl Bowie saw her in consultation, note reviewed.  She was having ongoing fatigue since her stents were placed in May 2018.  Low energy.  Short of breath at times.  Change off of Brilinta to Plavix did not help.  She was having increased nausea.  Intermittent left side mid rib cage 5/10 dull pain.  Rest or exertion.  Non-positional.  Daily.  Troponins were negative.  X-ray was negative.  EKG with nonspecific ST-T wave changes.  Beta-blocker was reduced by cardiology team.  There was question of possible obstructive sleep apnea may wish to have a sleep study.  Her Valtrex was DC'd which can cause fatigue.  She just feels similar shortness of breath.  Interestingly, back in 2014 she had a CT angiogram of chest  because of shortness of breath as well.  Thankfully, her ejection fraction is now normalized after echocardiogram repeat in July 2018.  No syncope bleeding orthopnea PND.  She desires to go back to cardiac rehab.  Wt Readings from Last 3 Encounters:  08/24/17 143 lb 1.9 oz (64.9 kg)  08/21/17 143 lb (64.9 kg)  08/02/17 145 lb 8 oz (66 kg)     Past Medical History:  Diagnosis Date  . Allergy   . Anxiety   . CAD (coronary artery disease)   . Cataract   . Hemorrhoids   . Hypertension   . Mitral valve prolapse   . Osteoporosis   . Status post dilation of esophageal narrowing     Past Surgical History:  Procedure Laterality Date  . BASAL CELL CARCINOMA EXCISION  02/05/2016   Dr. Sarajane Jews, [facility]  . BLADDER SURGERY  11/15/1995   bladder neck suspension, urinary incontinence  . CARDIAC CATHETERIZATION  01/03/2017 DES LAD  . CATARACT EXTRACTION Left 04/30/2016   Dr. Tommy Rainwater, [place] N/A 01/03/2017   Procedure: Coronary Stent Intervention;  Surgeon: Nelva Bush, MD;  Location: [facility];  Service: Cardiovascular;  Laterality: N/A;  . DENTAL SURGERY  06/20/2016   Tooth Implant; Dr. Kalman Shan  . INTRAVASCULAR ULTRASOUND/IVUS N/A 01/03/2017   Procedure: Intravascular Ultrasound/IVUS;  Surgeon: Nelva Bush, MD;  Location: [facility];  Service: Cardiovascular;  Laterality: N/A;  . KNEE SURGERY  2003   torn meniscus  . LEFT HEART CATH AND CORONARY ANGIOGRAPHY N/A 01/03/2017   Procedure: Left Heart Cath and Coronary Angiography;  Surgeon: Nelva Bush, MD;  Location: [facility];  Service: Cardiovascular;  Laterality: N/A;  . MOHS SURGERY Right 2017   right side of face  . SHOULDER SURGERY Right 2005    Current Outpatient Medications  Medication Sig Dispense Refill  . albuterol (PROVENTIL HFA;VENTOLIN HFA) 108 (90 Base) MCG/ACT inhaler Inhale 1 puff into the lungs every 6 (six) hours as needed for  wheezing or shortness of breath.    Marland Kitchen aspirin EC 81 MG tablet Take 81 mg by mouth every morning.     Marland Kitchen atorvastatin (LIPITOR) 80 MG tablet Take 1 tablet (80 mg total) by mouth daily at 6 PM. 30 tablet 6  . Cholecalciferol (VITAMIN D3) 1000 UNITS CAPS Take 1 capsule by mouth every morning.     . furosemide (LASIX) 20 MG tablet Take 20 mg daily as needed by mouth for edema.    . isosorbide mononitrate (IMDUR) 30 MG 24 hr tablet Take 1 tablet (30 mg total) by mouth daily. 90 tablet 3  . loratadine (CLARITIN) 10 MG tablet Take 1 tablet (10 mg total) by mouth every morning. 30 tablet 2  . losartan (COZAAR) 50 MG tablet Take 50 mg daily by mouth.    . metoprolol succinate (TOPROL-XL) 25 MG 24 hr tablet Take 1 tablet (25 mg total) by mouth daily. 30 tablet 0  . Multiple Vitamin (MULTIVITAMIN) tablet Take 1 tablet by mouth every morning.     . nitroGLYCERIN (NITROSTAT) 0.4 MG SL tablet Place 1 tablet (0.4 mg total) under the tongue every 5 (five) minutes as needed for chest pain. 25 tablet 3  . Omega-3 Fatty Acids (FISH OIL) 1200 MG CAPS Take 1 capsule by mouth every morning.     Marland Kitchen omeprazole (PRILOSEC) 40 MG capsule Take 40 mg by mouth daily.     . potassium chloride (K-DUR) 10 MEQ tablet Take 10 mEq daily as needed by mouth. If taking Furosemide (Lasix)    . prasugrel (EFFIENT) 10 MG TABS tablet Take 10 mg by mouth daily.    . traMADol (ULTRAM) 50 MG tablet Take 1 tablet (50 mg total) by mouth every 8 (eight) hours as needed (pain). 30 tablet 0   No current facility-administered medications for this visit.     Allergies:    Allergies  Allergen Reactions  . Codeine     REACTION: nausea and vomiting  . Penicillins Nausea And Vomiting    REACTION: rash    Social History:  The patient  reports that  has never smoked. she has never used smokeless tobacco. She reports that she does not drink alcohol or use drugs.  Retired Family History  Problem Relation Age of Onset  . Mitral valve prolapse  Mother   . Hypertension Father     ROS:  Please see the history of present illness.  Occasional back pain thoracic pain, takes Tylenol, no syncope, bleeding.  Positive bruising.    All other systems reviewed and negative.   PHYSICAL EXAM: VS:  BP 124/78   Pulse 80   Ht 5' 2"  (1.575 m)   Wt 143 lb 1.9 oz (64.9 kg)   LMP  (LMP Unknown)   SpO2 98%   BMI 26.18 kg/m  GEN: Well nourished, well developed, in  no acute distress  HEENT: normal  Neck: no JVD, carotid bruits, or masses Cardiac: RRR; no murmurs, rubs, or gallops,no edema  Respiratory:  clear to auscultation bilaterally, normal work of breathing GI: soft, nontender, nondistended, + BS MS: no deformity or atrophy  Skin: warm and dry, no rash Neuro:  Alert and Oriented x 3, Strength and sensation are intact Psych: euthymic mood, full affect  EKG:-prior 04/15/16-sinus bradycardia, 50 right bundle branch block personally viewed-no significant change from prior SR RBBB.   Chronic  LABS: LDL 124, creatinine 0.9, hemoglobin 14.1, vitamin B12 232, TSH 2.23  Cath Cath 01/03/17 Conclusions: 1. Significant 2-vessel coronary artery disease, including diffuse LAD disease with up to 99% stenosis in the mid portion and TIMI-2 flow, as well as 90% small ramus lesion and 40% proximal/mid LCx disease. 2. Moderately reduced LV contraction (LVEF 35-45%) with anterior and apical akinesis. 3. Moderately elevated left ventricular filling pressure. 4. Successful IVUS-guided PCI to the mid and distal LAD with placement of 2 overlapping Resolute Onyx drug-eluting stents with 0% residual stenosis and TIMI-3 flow.  Recommendations: 1. Dual antiplatelet therapy with aspirin and ticagrelor for at least 12 months, ideally longer. 2. Aggressive secondary prevention. 3. Medical therapy for small ramus intermedius and jailed diagonal branch, as PCI would be challenging given small vessel size. 4. Diuresis and titration of evidence-based heart failure  therapy, given moderately reduced LV contraction with elevated filling pressure.   Echocardiogram 01/03/17: -EF 45%  ECHO 02/2017  - EF 65%  ASSESSMENT AND PLAN:   Non-ST elevation myocardial infarction 01/03/17  - LAD stents 2, residual ramus disease, jailed diagonal.  - Switched from [place] to [employer]. No significant resolution in shortness of breath symptoms -She wanted to reinstate with cardiac rehabilitation.  Going to [employer] but would rather go to cardiac rehab.  We will refer her.  -  After 1 year post stent placement we should be able to come off of Effient.  -Thankfully, ejection fraction has improved from 45-65.  Hyperlipidemia  - Continue with high intensity statin therapy. LDL 44, no myalgias.  Shortness of breath  -We will restart cardiac rehab.  -I will check PFTs.  -EF has normalized.  No significant mitral valve prolapse was seen on prior echo.  If she does not need dental prophylaxis.  We will have her come back in 3 months to see Remer Macho, NP.  Signed, Candee Furbish, MD [facility]  08/24/2017 12:00 PM

## 2017-08-24 NOTE — Patient Instructions (Signed)
Medication Instructions:  The current medical regimen is effective;  continue present plan and medications.  Testing/Procedures: Your physician has recommended that you have a pulmonary function test. Pulmonary Function Tests are a group of tests that measure how well air moves in and out of your lungs.  You have been referred back to Cardiac Rehab and will be contacted to schedule this.  Follow-Up: Follow up in 3 months with Truitt Merle, NP.  If you need a refill on your cardiac medications before your next appointment, please call your pharmacy.  Thank you for choosing [facility]!!

## 2017-08-25 ENCOUNTER — Encounter: Payer: Self-pay | Admitting: Internal Medicine

## 2017-08-25 ENCOUNTER — Telehealth: Payer: Self-pay | Admitting: Internal Medicine

## 2017-08-25 ENCOUNTER — Ambulatory Visit (INDEPENDENT_AMBULATORY_CARE_PROVIDER_SITE_OTHER): Payer: PPO | Admitting: Internal Medicine

## 2017-08-25 VITALS — BP 144/80 | HR 59 | Temp 98.0°F | Wt 144.8 lb

## 2017-08-25 DIAGNOSIS — I2583 Coronary atherosclerosis due to lipid rich plaque: Secondary | ICD-10-CM | POA: Diagnosis not present

## 2017-08-25 DIAGNOSIS — J3489 Other specified disorders of nose and nasal sinuses: Secondary | ICD-10-CM

## 2017-08-25 DIAGNOSIS — I2510 Atherosclerotic heart disease of native coronary artery without angina pectoris: Secondary | ICD-10-CM | POA: Diagnosis not present

## 2017-08-25 MED ORDER — TRAMADOL HCL 50 MG PO TABS: 50.0000 mg | ORAL_TABLET | Freq: Three times a day (TID) | ORAL | 0 refills | Status: DC | PRN

## 2017-08-25 MED ORDER — MUPIROCIN CALCIUM 2 % EX CREA: 1.0000 "application " | TOPICAL_CREAM | Freq: Two times a day (BID) | CUTANEOUS | Status: DC

## 2017-08-25 NOTE — Progress Notes (Signed)
Subjective:    Patient ID: Hannah Vasquez, female    DOB: 1951/01/04, 67 y.o.   MRN: [id number]  HPI Here for hospital follow up  Did see Dr Skains--cardiologist yesterday Referral back to cardiac rehab Also referred to PFT's  Some SOB--- trouble with her voice at times Did stop her valtrex to see if that was part of the problem No chest pain No palpitations  On lower dose of beta blocker now  Chronic back pain/arthritis Saw Dr Sharee Pimple Uses the tramadol since off lyrica (didn't help)-- at most one a day  Current Outpatient Medications on File Prior to Visit  Medication Sig Dispense Refill  . albuterol (PROVENTIL HFA;VENTOLIN HFA) 108 (90 Base) MCG/ACT inhaler Inhale 1 puff into the lungs every 6 (six) hours as needed for wheezing or shortness of breath.    Marland Kitchen aspirin EC 81 MG tablet Take 81 mg by mouth every morning.     Marland Kitchen atorvastatin (LIPITOR) 80 MG tablet Take 1 tablet (80 mg total) by mouth daily at 6 PM. 30 tablet 6  . Cholecalciferol (VITAMIN D3) 1000 UNITS CAPS Take 1 capsule by mouth every morning.     . furosemide (LASIX) 20 MG tablet Take 20 mg daily as needed by mouth for edema.    . isosorbide mononitrate (IMDUR) 30 MG 24 hr tablet Take 1 tablet (30 mg total) by mouth daily. 90 tablet 3  . loratadine (CLARITIN) 10 MG tablet Take 1 tablet (10 mg total) by mouth every morning. 30 tablet 2  . losartan (COZAAR) 50 MG tablet Take 50 mg daily by mouth.    . metoprolol succinate (TOPROL-XL) 25 MG 24 hr tablet Take 1 tablet (25 mg total) by mouth daily. 30 tablet 0  . Multiple Vitamin (MULTIVITAMIN) tablet Take 1 tablet by mouth every morning.     . nitroGLYCERIN (NITROSTAT) 0.4 MG SL tablet Place 1 tablet (0.4 mg total) under the tongue every 5 (five) minutes as needed for chest pain. 25 tablet 3  . Omega-3 Fatty Acids (FISH OIL) 1200 MG CAPS Take 1 capsule by mouth every morning.     Marland Kitchen omeprazole (PRILOSEC) 40 MG capsule Take 40 mg by mouth daily.     . potassium chloride  (K-DUR) 10 MEQ tablet Take 10 mEq daily as needed by mouth. If taking Furosemide (Lasix)    . prasugrel (EFFIENT) 10 MG TABS tablet Take 10 mg by mouth daily.    . traMADol (ULTRAM) 50 MG tablet Take 1 tablet (50 mg total) by mouth every 8 (eight) hours as needed (pain). 30 tablet 0   No current facility-administered medications on file prior to visit.     Allergies  Allergen Reactions  . Codeine     REACTION: nausea and vomiting  . Penicillins Nausea And Vomiting    REACTION: rash    Past Medical History:  Diagnosis Date  . Allergy   . Anxiety   . CAD (coronary artery disease)   . Cataract   . Hemorrhoids   . Hypertension   . Mitral valve prolapse   . Osteoporosis   . Status post dilation of esophageal narrowing     Past Surgical History:  Procedure Laterality Date  . BASAL CELL CARCINOMA EXCISION  02/05/2016   Dr. Sarajane Jews, [facility]  . BLADDER SURGERY  11/15/1995   bladder neck suspension, urinary incontinence  . CARDIAC CATHETERIZATION  01/03/2017 DES LAD  . CATARACT EXTRACTION Left 04/30/2016   Dr. Tommy Rainwater, [place]  STENT INTERVENTION N/A 01/03/2017   Procedure: Coronary Stent Intervention;  Surgeon: Nelva [place], MD;  Location: [facility];  Service: Cardiovascular;  Laterality: N/A;  . DENTAL SURGERY  06/20/2016   Tooth Implant; Dr. Kalman Shan  . INTRAVASCULAR ULTRASOUND/IVUS N/A 01/03/2017   Procedure: Intravascular Ultrasound/IVUS;  Surgeon: Nelva [place], MD;  Location: [facility];  Service: Cardiovascular;  Laterality: N/A;  . KNEE SURGERY  2003   torn meniscus  . LEFT HEART CATH AND CORONARY ANGIOGRAPHY N/A 01/03/2017   Procedure: Left Heart Cath and Coronary Angiography;  Surgeon: Nelva [place], MD;  Location: [facility];  Service: Cardiovascular;  Laterality: N/A;  . MOHS SURGERY Right 2017   right side of face  . SHOULDER SURGERY Right 2005    Family History  Problem Relation Age of Onset  .  Mitral valve prolapse Mother   . Hypertension Father     Social History   Socioeconomic History  . Marital status: Divorced    Spouse name: Not on file  . Number of children: 2  . Years of education: Not on file  . Highest education level: Not on file  Social Needs  . Financial resource strain: Not on file  . Food insecurity - worry: Not on file  . Food insecurity - inability: Not on file  . Transportation needs - medical: Not on file  . Transportation needs - non-medical: Not on file  Occupational History  . Occupation: Medical office---front desk    Comment: Retired  Tobacco Use  . Smoking status: Never Smoker  . Smokeless tobacco: Never Used  Substance and Sexual Activity  . Alcohol use: No    Alcohol/week: 0.0 oz  . Drug use: No  . Sexual activity: Yes  Other Topics Concern  . Not on file  Social History Narrative   Has a living will.   HPOA- daughter.   She would desire CPR.  Would desire life support if necessary and not futile.         Review of Systems  Appetite is off No heartburn on omeprazole No dysphagia No environmental allergies    Objective:   Physical Exam  Constitutional: She appears well-developed. No distress.  HENT:  Several bleeding spots on distal right septum. No raised lesions  Neck: No thyromegaly present.  Cardiovascular: Normal rate, regular rhythm and normal heart sounds. Exam reveals no gallop.  No murmur heard. Pulmonary/Chest: Effort normal and breath sounds normal. No respiratory distress. She has no wheezes. She has no rales.  Musculoskeletal: She exhibits no edema.  Lymphadenopathy:    She has no cervical adenopathy.          Assessment & Plan:

## 2017-08-25 NOTE — Assessment & Plan Note (Signed)
Now with evident lesions Antiviral no help MRSA negative but will try topical antibiotic prn ENT if persists

## 2017-08-25 NOTE — Assessment & Plan Note (Signed)
Unclear if dyspnea related to this but good EF Lower beta blocker Starting cardiac rehab

## 2017-08-29 ENCOUNTER — Ambulatory Visit (INDEPENDENT_AMBULATORY_CARE_PROVIDER_SITE_OTHER): Payer: PPO | Admitting: Family Medicine

## 2017-08-29 ENCOUNTER — Encounter: Payer: Self-pay | Admitting: Family Medicine

## 2017-08-29 DIAGNOSIS — R21 Rash and other nonspecific skin eruption: Secondary | ICD-10-CM

## 2017-08-29 MED ORDER — PREDNISONE 20 MG PO TABS: ORAL_TABLET | ORAL | 0 refills | Status: DC

## 2017-08-29 NOTE — Assessment & Plan Note (Signed)
Local irritation.  Stop bactroban for now. I don't think this caused the reaction but it is safer to hold it for now.  D/w pt.   Start prednisone with food.  Routine steroid cautions given, esp with possible fluid retention- hasn't needed lasix recently.   Continue claritin.  Update [place] in a few days.  To the ER if lip or tongue swelling or if short of breath.  She agrees.

## 2017-08-29 NOTE — Patient Instructions (Signed)
Stop bactroban for now. I don't think this caused the reaction but it is safer to hold it for now.  Start prednisone with food.  Continue claritin.  Update [place] in a few days.  To the ER if lip or tongue swelling or if short of breath.  Take care.  Glad to see you.

## 2017-08-29 NOTE — Progress Notes (Addendum)
Recently admitted with fatigue and metoprolol reduced.   Went to bed 1/12 with no sx.  Then yesterday AM noted R eye area swelling.  Puffy and red locally and itchy.  R>L sx initially, then the L side "caught up" this AM with redness, swelling, itching.  No new meds.  No new triggers, foods, exposures except for topical bactroban used in the nostrils.  No sx like this prev.  No FCVD. Her appetite was okay yesterday but dec in appetite today, with some nausea today.  No wheeze.  Can still get a good deep breath.  Normal swallowing.  Rash also on the neck and behind the ears.  Not on trunk or ext.  No lip or tongue swelling.    Meds, vitals, and allergies reviewed.   ROS: Per HPI unless specifically indicated in ROS section   nad Diffuse blanching maculopapular rash on the B face with all eyelids puffy but conjunctiva wnl B. No scalp on rash.  No vesicles.  No abscess.  TM wnl B but B pinna irritation noted.  OP wnl. Mmm.  Lips and tongue not puffy.  Minimal nasal irritation on the R septum.  No LA in the neck.  Neck supple. No stridor. Mild rash (similar to above but less pronounced) on the neck B rrr Ctab Ext w/o edema No lesions on the back or palms.

## 2017-09-02 ENCOUNTER — Telehealth: Payer: Self-pay | Admitting: Family Medicine

## 2017-09-02 NOTE — Telephone Encounter (Signed)
I would continue the lasix until she gets back down to her usual weight and then use PRN.  She should have a few days of prednisone left.  If worsening in the meantime, then update [place].  She may need another round of prednisone.   Glad she is some better.   Thanks.

## 2017-09-02 NOTE — Telephone Encounter (Signed)
Copied from [place] [id number]. Topic: Quick Communication - See Telephone Encounter >> Sep 02, 2017  8:10 AM Oneta Rack wrote:  Relation to pt: self Call back number:[phone]   Reason for call:  Patient last seen by Dr. Damita Dunnings on 08/29/17 and wanted to inform him face swelling and rash have improved. Patient experiencing itchiness around her eyes, rash is still present. Patient finished predniSONE (DELTASONE) 20 MG tablet and gain 1lb and would like to know if she should continue lasix for a few more days, please advise

## 2017-09-02 NOTE — Telephone Encounter (Signed)
Left detailed message on voicemail.  

## 2017-09-05 ENCOUNTER — Other Ambulatory Visit: Payer: Self-pay | Admitting: Internal Medicine

## 2017-09-05 DIAGNOSIS — R0609 Other forms of dyspnea: Principal | ICD-10-CM

## 2017-09-06 ENCOUNTER — Other Ambulatory Visit: Payer: Self-pay

## 2017-09-06 ENCOUNTER — Ambulatory Visit (INDEPENDENT_AMBULATORY_CARE_PROVIDER_SITE_OTHER): Payer: PPO | Admitting: Internal Medicine

## 2017-09-06 DIAGNOSIS — R0609 Other forms of dyspnea: Secondary | ICD-10-CM

## 2017-09-06 LAB — PULMONARY FUNCTION TEST
DL/VA % pred: 86 %
DL/VA: 3.94 ml/min/mmHg/L
DLCO UNC % PRED: 76 %
DLCO cor % pred: 77 %
DLCO cor: 16.78 ml/min/mmHg
DLCO unc: 16.46 ml/min/mmHg
FEF 25-75 PRE: 2.26 L/s
FEF 25-75 Post: 2.21 L/sec
FEF2575-%Change-Post: -2 %
FEF2575-%PRED-PRE: 122 %
FEF2575-%Pred-Post: 120 %
FEV1-%CHANGE-POST: -1 %
FEV1-%PRED-PRE: 98 %
FEV1-%Pred-Post: 96 %
FEV1-POST: 2.04 L
FEV1-PRE: 2.08 L
FEV1FVC-%CHANGE-POST: 4 %
FEV1FVC-%PRED-PRE: 108 %
FEV6-%Change-Post: -6 %
FEV6-%Pred-Post: 88 %
FEV6-%Pred-Pre: 94 %
FEV6-POST: 2.36 L
FEV6-Pre: 2.53 L
FEV6FVC-%PRED-PRE: 104 %
FEV6FVC-%Pred-Post: 104 %
FVC-%Change-Post: -6 %
FVC-%PRED-POST: 84 %
FVC-%PRED-PRE: 90 %
FVC-PRE: 2.53 L
FVC-Post: 2.36 L
POST FEV1/FVC RATIO: 86 %
POST FEV6/FVC RATIO: 100 %
PRE FEV1/FVC RATIO: 82 %
Pre FEV6/FVC Ratio: 100 %
RV % PRED: 92 %
RV: 1.92 L
TLC % PRED: 97 %
TLC: 4.61 L

## 2017-09-06 MED ORDER — PREDNISONE 20 MG PO TABS: ORAL_TABLET | ORAL | 0 refills | Status: DC

## 2017-09-06 MED ORDER — FUROSEMIDE 20 MG PO TABS: 20.0000 mg | ORAL_TABLET | Freq: Every day | ORAL | 3 refills | Status: DC | PRN

## 2017-09-06 NOTE — Progress Notes (Signed)
PFT completed today per Dr. Marlou Porch 09/06/17

## 2017-09-06 NOTE — Telephone Encounter (Addendum)
°  Relation to pt: self Call back [phone] Pharmacy: [facility], [place]  Reason for call:  Patient received voice mail and wanted to inform PCP she will take her last prednisone tomorrow morning and due to itchiness around her eyes she would like another round of prednisone, please advise

## 2017-09-06 NOTE — Telephone Encounter (Signed)
Sent but if not better then needs recheck.  Thanks.

## 2017-09-06 NOTE — Telephone Encounter (Signed)
Patient advised.

## 2017-09-06 NOTE — Addendum Note (Signed)
Addended by: Tonia Ghent on: 09/06/2017 05:36 PM   Modules accepted: Orders

## 2017-09-12 ENCOUNTER — Telehealth: Payer: Self-pay | Admitting: Cardiology

## 2017-09-12 ENCOUNTER — Ambulatory Visit: Payer: PPO | Admitting: Internal Medicine

## 2017-09-12 NOTE — Telephone Encounter (Signed)
Results not available at this time.

## 2017-09-12 NOTE — Telephone Encounter (Signed)
New Message   Patient is calling to check on the results of her pulmonary function test. Please call to discuss.

## 2017-09-12 NOTE — Telephone Encounter (Signed)
Pt aware PFT results are not available yet.  She will be called with them as soon as we have them.  She states understanding.

## 2017-09-16 ENCOUNTER — Telehealth: Payer: Self-pay | Admitting: Cardiology

## 2017-09-16 DIAGNOSIS — R0609 Other forms of dyspnea: Principal | ICD-10-CM

## 2017-09-16 NOTE — Telephone Encounter (Signed)
Left message for pt that I do not have the results at this time but have requested a timeline from pulmonary as to when she can expect them to be resulted.  Advised I will c/b once I have the results.

## 2017-09-16 NOTE — Telephone Encounter (Signed)
Mrs. Cherney is returning a call about her PFT results . Please call

## 2017-09-21 ENCOUNTER — Telehealth: Payer: Self-pay | Admitting: Cardiology

## 2017-09-21 NOTE — Telephone Encounter (Signed)
Pt c/o medication issue:  1. Name of Medication:Prasugrel   2. How are you currently taking this medication (dosage and times per day)? 10 mg   3. Are you having a reaction (difficulty breathing--STAT)? no  4. What is your medication issue? Patient states that her pharmacy closes and so this medication was sent to another pharmacy. Patient states because of this switch, her medication copay went from $35 to $90, patient called to insurance and was told that she could possibly have medication under a different tier. Patient is calling to discuss the matter

## 2017-09-21 NOTE — Telephone Encounter (Signed)
Hx of drug eluting stents 01/03/2017 changed from ticagrelor (d/t SOB) to Effient Reviewed pt's [employer] PPO medication formulary.  According to it Prasugrel is Tier 4 - $90 copay Pradaxa Tier 4 - $90 copay Plavix Tier 1 - $5 copay Will forward to Dr Marlou Porch for review and orders

## 2017-09-21 NOTE — Telephone Encounter (Signed)
Follow up   Patient called to advise that her issue has been resolved. No call back is needed.

## 2017-09-22 ENCOUNTER — Telehealth: Payer: Self-pay | Admitting: *Deleted

## 2017-09-22 NOTE — Telephone Encounter (Signed)
Received tier exception form from [employer] for PRASUGREL 10MG, see Pam Flemings, RN note, have completed and will fax back to [facility]>

## 2017-09-23 NOTE — Telephone Encounter (Signed)
Hannah Vasquez is calling back in reference to cardiac rehab and they said that someone will call her on Monday , she qualify for the cardiac maintenance program but  Not the other program because of the diagnosis that was given . Please call

## 2017-09-23 NOTE — Telephone Encounter (Signed)
Had Dr Marlou Porch review the results of PFTs.  He would like the patient to be made aware her lung volumes are normal.  There is a mild reduction in the ability of 02 to transfer to the blood in the lungs however does not appear to be any emphysema/smoker's lung.  He feels it prudent to refer her to pulmonary to further discuss the results and determine if any further treatment of her SOB is necessary.     Discussed the above information with patient who states understanding.  She is aware a referral will be made for her to see Pulmonary and someone will be calling her to make that appt.

## 2017-09-23 NOTE — Telephone Encounter (Signed)
**Note De-Identified Dajia Gunnels Obfuscation** Approval received on the pts Prasugrel tier exception Merlen Gurry fax from [employer]. Approval good from 09/22/2017 until 08/15/2018.  I have notified the pts pharmacy.

## 2017-09-23 NOTE — Telephone Encounter (Signed)
Spoke with patient who is aware she does not qualify for cardiac rehab but does qualify for the maintenance program.  She reports she will follow through with this.

## 2017-10-05 ENCOUNTER — Encounter: Payer: Self-pay | Admitting: Emergency Medicine

## 2017-10-05 ENCOUNTER — Ambulatory Visit: Payer: PPO | Admitting: Emergency Medicine

## 2017-10-05 DIAGNOSIS — R0609 Other forms of dyspnea: Secondary | ICD-10-CM | POA: Diagnosis not present

## 2017-10-05 NOTE — Progress Notes (Signed)
Subjective:    Patient ID: Hannah Vasquez, female    DOB: 1951/08/10, 67 y.o.   MRN: [id number]  HPI 67 year old never smoker with a history of hypertension, coronary artery disease status post MI 12/2016, mitral valve prolapse.  She has had continued fatigue and exertional dyspnea since spring 2018, was admitted 08/2017 with atypical chest pain, fatigue and dyspnea.  Her medications were adjusted -beta-blockade decreased, antiplatelet medication altered.  Her echocardiogram in 03/10/17 showed improvement in her left ventricular function following her MI.  She did cardiac rehab post-MI, is about to restart a maintenance program.   As part of her evaluation of dyspnea pulmonary function testing was done on 09/06/17 that I have reviewed.  This shows normal spirometry, no bronchodilator response.  Her lung volumes are normal.  Her diffusion capacity was slightly decreased but corrected to the normal range when adjusted for her alveolar volume. She notes that she was on pred for a rash at the time of this testing.   She describes difficulty getting a deep breath, feels some dyspnea and tightness that can happen at any time.  She has an albuterol inhaler that she received from PCP many yrs ago, unclear whether she had a formal dx asthma. Hasn't used it since her MI (didn't help her pre-MI). She felt that her breathing improved after cardiac rehab, but then recurrence of dyspnea and exhaustion beginning in January 2019 with an admission as above. She was treated for a rash a week after the hospitalization.   Now continues to have some dyspnea with weather extremes, with housework, with talking a lot. can be associated with hoarseness.  She has some increased nasal gtt, keeps a tissue with her. She is on loratadine, underwent an EGD with dilation in the past, is on omeprazole 40m. Denies any GERD sx currently.   CXR 08/21/17 >> normal   Review of Systems  Constitutional: Negative for fever and unexpected  weight change.  HENT: Positive for congestion, dental problem, ear pain, nosebleeds, postnasal drip and rhinorrhea. Negative for sneezing, sore throat and trouble swallowing.   Eyes: Positive for itching. Negative for redness.  Respiratory: Positive for chest tightness and shortness of breath. Negative for cough and wheezing.   Cardiovascular: Positive for palpitations. Negative for leg swelling.  Gastrointestinal: Positive for nausea. Negative for vomiting.  Genitourinary: Negative for dysuria.  Musculoskeletal: Negative for joint swelling.  Skin: Negative for rash.  Allergic/Immunologic: Negative.  Negative for environmental allergies, food allergies and immunocompromised state.  Neurological: Positive for headaches.  Hematological: Bruises/bleeds easily.  Psychiatric/Behavioral: Negative for dysphoric mood. The patient is not nervous/anxious.     Past Medical History:  Diagnosis Date  . Allergy   . Anxiety   . CAD (coronary artery disease)   . Cataract   . Hemorrhoids   . Hypertension   . Mitral valve prolapse   . Osteoporosis   . Status post dilation of esophageal narrowing      Family History  Problem Relation Age of Onset  . Mitral valve prolapse Mother   . Hypertension Father   . Brain cancer Father      Social History   Socioeconomic History  . Marital status: Divorced    Spouse name: Not on file  . Number of children: 2  . Years of education: Not on file  . Highest education level: Not on file  Social Needs  . Financial resource strain: Not on file  . Food insecurity - worry: Not on file  .  Food insecurity - inability: Not on file  . Transportation needs - medical: Not on file  . Transportation needs - non-medical: Not on file  Occupational History  . Occupation: Medical office---front desk    Comment: Retired  Tobacco Use  . Smoking status: Never Smoker  . Smokeless tobacco: Never Used  Substance and Sexual Activity  . Alcohol use: No     Alcohol/week: 0.0 oz  . Drug use: No  . Sexual activity: Yes  Other Topics Concern  . Not on file  Social History Narrative   Has a living will.   HPOA- daughter.   She would desire CPR.  Would desire life support if necessary and not futile.           Allergies  Allergen Reactions  . Codeine     REACTION: nausea and vomiting  . Penicillins Nausea And Vomiting    REACTION: rash     Outpatient Medications Prior to Visit  Medication Sig Dispense Refill  . albuterol (PROVENTIL HFA;VENTOLIN HFA) 108 (90 Base) MCG/ACT inhaler Inhale 1 puff into the lungs every 6 (six) hours as needed for wheezing or shortness of breath.    Marland Kitchen aspirin EC 81 MG tablet Take 81 mg by mouth every morning.     Marland Kitchen atorvastatin (LIPITOR) 80 MG tablet Take 1 tablet (80 mg total) by mouth daily at 6 PM. 30 tablet 6  . Cholecalciferol (VITAMIN D3) 1000 UNITS CAPS Take 1 capsule by mouth every morning.     . furosemide (LASIX) 20 MG tablet Take 1 tablet (20 mg total) by mouth daily as needed for edema. 90 tablet 3  . isosorbide mononitrate (IMDUR) 30 MG 24 hr tablet Take 1 tablet (30 mg total) by mouth daily. 90 tablet 3  . loratadine (CLARITIN) 10 MG tablet Take 1 tablet (10 mg total) by mouth every morning. 30 tablet 2  . losartan (COZAAR) 50 MG tablet Take 50 mg daily by mouth.    . metoprolol succinate (TOPROL-XL) 25 MG 24 hr tablet Take 1 tablet (25 mg total) by mouth daily. 30 tablet 0  . Multiple Vitamin (MULTIVITAMIN) tablet Take 1 tablet by mouth every morning.     . nitroGLYCERIN (NITROSTAT) 0.4 MG SL tablet Place 1 tablet (0.4 mg total) under the tongue every 5 (five) minutes as needed for chest pain. 25 tablet 3  . Omega-3 Fatty Acids (FISH OIL) 1200 MG CAPS Take 1 capsule by mouth every morning.     Marland Kitchen omeprazole (PRILOSEC) 40 MG capsule Take 40 mg by mouth daily.     . potassium chloride (K-DUR) 10 MEQ tablet Take 10 mEq daily as needed by mouth. If taking Furosemide (Lasix)    . prasugrel (EFFIENT) 10  MG TABS tablet Take 10 mg by mouth daily.    . traMADol (ULTRAM) 50 MG tablet Take 1 tablet (50 mg total) by mouth every 8 (eight) hours as needed (pain). 30 tablet 0  . mupirocin cream (BACTROBAN) 2 % Apply 1 application topically 2 (two) times daily. (Patient not taking: Reported on 10/05/2017) 15 g 1`  . predniSONE (DELTASONE) 20 MG tablet Take 2 a day for 5 days, then 1 a day for 5 days, with food. Don't take with aleve/ibuprofen. (Patient not taking: Reported on 10/05/2017) 15 tablet 0   No facility-administered medications prior to visit.         Objective:   Physical Exam Vitals:   10/05/17 1045  BP: 140/90  Pulse: 72  SpO2: 98%  Weight: 145 lb 9.6 oz (66 kg)  Height: 5' 2"  (1.575 m)   Gen: Pleasant, well-nourished, in no distress,  normal affect  ENT: No lesions,  mouth clear,  oropharynx clear, no postnasal drip  Neck: No JVD, no TMG, no carotid bruits  Lungs: No use of accessory muscles, clear B  Cardiovascular: RRR, distant, no murmur or gallops, no peripheral edema  Musculoskeletal: No deformities, no cyanosis or clubbing  Neuro: alert, non focal  Skin: Warm, no lesions or rash      Assessment & Plan:  DOE (dyspnea on exertion) Etiology not entirely clear.  She has had a reassuring cardiac evaluation since her myocardial infarction in May 2018.  In fact her breathing was better after she did cardio pulmonary rehab.  She has worsened this winter.  If no evidence of obstruction or restriction on her PFT, chest x-ray reassuring.  She does describe some hoarseness and possible upper airway contribution but I doubt that this would be enough to explain exercise limitation.  Suspect there is a component here of deconditioning.  She is going back to cardio pulmonary rehab soon.  If her dyspnea persists after rehab then I think it would be reasonable to workup further with a cardiac pulmonary exercise test.  Proceed with cardiopulmonary rehab as planned We will perform  walking oximetry today. Depending on the improvement in your breathing after cardiopulmonary rehab, we may decide to perform a cardiopulmonary exercise test to identify the contributors to your shortness of breath.  We can discuss this at your follow-up visit. Continue loratadine 10 mg daily Continue omeprazole 40 mg daily Start using fluticasone nasal spray, 2 sprays each nostril once a day to see if this helps with nasal drainage, hoarseness, possible upper airway contribution to your short windedness. Follow with Dr Lamonte Sakai in 6 months or sooner if you have any problems  Baltazar Apo, MD, PhD 10/05/2017, 11:12 AM [facility] [phone] or if no answer [phone]

## 2017-10-05 NOTE — Patient Instructions (Addendum)
Proceed with cardiopulmonary rehab as planned We will perform walking oximetry today. Depending on the improvement in your breathing after cardiopulmonary rehab, we may decide to perform a cardiopulmonary exercise test to identify the contributors to your shortness of breath.  We can discuss this at your follow-up visit. Continue loratadine 10 mg daily Continue omeprazole 40 mg daily Start using fluticasone nasal spray, 2 sprays each nostril once a day to see if this helps with nasal drainage, hoarseness, possible upper airway contribution to your short windedness. Follow with Dr Lamonte Sakai in 6 months or sooner if you have any problems

## 2017-10-05 NOTE — Assessment & Plan Note (Signed)
Etiology not entirely clear.  She has had a reassuring cardiac evaluation since her myocardial infarction in May 2018.  In fact her breathing was better after she did cardio pulmonary rehab.  She has worsened this winter.  If no evidence of obstruction or restriction on her PFT, chest x-ray reassuring.  She does describe some hoarseness and possible upper airway contribution but I doubt that this would be enough to explain exercise limitation.  Suspect there is a component here of deconditioning.  She is going back to cardio pulmonary rehab soon.  If her dyspnea persists after rehab then I think it would be reasonable to workup further with a cardiac pulmonary exercise test.  Proceed with cardiopulmonary rehab as planned We will perform walking oximetry today. Depending on the improvement in your breathing after cardiopulmonary rehab, we may decide to perform a cardiopulmonary exercise test to identify the contributors to your shortness of breath.  We can discuss this at your follow-up visit. Continue loratadine 10 mg daily Continue omeprazole 40 mg daily Start using fluticasone nasal spray, 2 sprays each nostril once a day to see if this helps with nasal drainage, hoarseness, possible upper airway contribution to your short windedness. Follow with Dr Lamonte Sakai in 6 months or sooner if you have any problems

## 2017-10-07 DIAGNOSIS — Z1389 Encounter for screening for other disorder: Secondary | ICD-10-CM | POA: Diagnosis not present

## 2017-10-07 DIAGNOSIS — Z01419 Encounter for gynecological examination (general) (routine) without abnormal findings: Secondary | ICD-10-CM | POA: Diagnosis not present

## 2017-10-07 DIAGNOSIS — I252 Old myocardial infarction: Secondary | ICD-10-CM

## 2017-10-07 DIAGNOSIS — Z6826 Body mass index (BMI) 26.0-26.9, adult: Secondary | ICD-10-CM | POA: Diagnosis not present

## 2017-10-07 HISTORY — DX: Old myocardial infarction: I25.2

## 2017-10-17 ENCOUNTER — Encounter (HOSPITAL_COMMUNITY): Payer: Self-pay

## 2017-10-19 ENCOUNTER — Encounter (HOSPITAL_COMMUNITY)
Admission: RE | Admit: 2017-10-19 | Discharge: 2017-10-19 | Disposition: A | Discharge status: Home / Self Care | Payer: Self-pay | Source: Ambulatory Visit | Attending: Cardiology | Admitting: Cardiology

## 2017-10-19 DIAGNOSIS — R5383 Other fatigue: Secondary | ICD-10-CM | POA: Insufficient documentation

## 2017-10-19 DIAGNOSIS — R0609 Other forms of dyspnea: Secondary | ICD-10-CM | POA: Insufficient documentation

## 2017-10-19 DIAGNOSIS — I2510 Atherosclerotic heart disease of native coronary artery without angina pectoris: Secondary | ICD-10-CM | POA: Insufficient documentation

## 2017-10-19 DIAGNOSIS — I10 Essential (primary) hypertension: Secondary | ICD-10-CM | POA: Insufficient documentation

## 2017-10-19 DIAGNOSIS — I2583 Coronary atherosclerosis due to lipid rich plaque: Secondary | ICD-10-CM | POA: Insufficient documentation

## 2017-10-21 ENCOUNTER — Encounter (HOSPITAL_COMMUNITY)
Admission: RE | Admit: 2017-10-21 | Discharge: 2017-10-21 | Disposition: A | Discharge status: Home / Self Care | Payer: Self-pay | Source: Ambulatory Visit | Attending: Cardiology | Admitting: Cardiology

## 2017-10-24 ENCOUNTER — Encounter (HOSPITAL_COMMUNITY): Payer: Self-pay

## 2017-10-24 ENCOUNTER — Encounter: Payer: Self-pay | Admitting: Internal Medicine

## 2017-10-24 ENCOUNTER — Ambulatory Visit (INDEPENDENT_AMBULATORY_CARE_PROVIDER_SITE_OTHER): Payer: PPO | Admitting: Internal Medicine

## 2017-10-24 VITALS — BP 124/62 | HR 58 | Temp 98.4°F | Wt 147.5 lb

## 2017-10-24 DIAGNOSIS — M25571 Pain in right ankle and joints of right foot: Secondary | ICD-10-CM | POA: Insufficient documentation

## 2017-10-24 MED ORDER — TRAMADOL HCL 50 MG PO TABS: 50.0000 mg | ORAL_TABLET | Freq: Three times a day (TID) | ORAL | 0 refills | Status: DC | PRN

## 2017-10-24 NOTE — Progress Notes (Signed)
Subjective:    Patient ID: Hannah Vasquez, female    DOB: January 18, 1951, 67 y.o.   MRN: [id number]  HPI Here due right ankle pain Not overly painful at times--but notices after walking for a while--- "it gives out" Then can be very painful--"like it is broken in half" It will turn   Started about a week ago No known injury No swelling  No Rx--just rests and elevates it  Current Outpatient Medications on File Prior to Visit  Medication Sig Dispense Refill  . albuterol (PROVENTIL HFA;VENTOLIN HFA) 108 (90 Base) MCG/ACT inhaler Inhale 1 puff into the lungs every 6 (six) hours as needed for wheezing or shortness of breath.    Marland Kitchen aspirin EC 81 MG tablet Take 81 mg by mouth every morning.     Marland Kitchen atorvastatin (LIPITOR) 80 MG tablet Take 1 tablet (80 mg total) by mouth daily at 6 PM. 30 tablet 6  . Cholecalciferol (VITAMIN D3) 1000 UNITS CAPS Take 1 capsule by mouth every morning.     . fluticasone (FLONASE) 50 MCG/ACT nasal spray Place into both nostrils daily.    . furosemide (LASIX) 20 MG tablet Take 1 tablet (20 mg total) by mouth daily as needed for edema. 90 tablet 3  . isosorbide mononitrate (IMDUR) 30 MG 24 hr tablet Take 1 tablet (30 mg total) by mouth daily. 90 tablet 3  . loratadine (CLARITIN) 10 MG tablet Take 1 tablet (10 mg total) by mouth every morning. 30 tablet 2  . losartan (COZAAR) 50 MG tablet Take 50 mg daily by mouth.    . metoprolol succinate (TOPROL-XL) 25 MG 24 hr tablet Take 1 tablet (25 mg total) by mouth daily. 30 tablet 0  . Multiple Vitamin (MULTIVITAMIN) tablet Take 1 tablet by mouth every morning.     . nitroGLYCERIN (NITROSTAT) 0.4 MG SL tablet Place 1 tablet (0.4 mg total) under the tongue every 5 (five) minutes as needed for chest pain. 25 tablet 3  . Omega-3 Fatty Acids (FISH OIL) 1200 MG CAPS Take 1 capsule by mouth every morning.     Marland Kitchen omeprazole (PRILOSEC) 40 MG capsule Take 40 mg by mouth daily.     . potassium chloride (K-DUR) 10 MEQ tablet Take 10 mEq  daily as needed by mouth. If taking Furosemide (Lasix)    . prasugrel (EFFIENT) 10 MG TABS tablet Take 10 mg by mouth daily.    . traMADol (ULTRAM) 50 MG tablet Take 1 tablet (50 mg total) by mouth every 8 (eight) hours as needed (pain). 30 tablet 0  . mupirocin cream (BACTROBAN) 2 % Apply 1 application topically 2 (two) times daily. (Patient not taking: Reported on 10/24/2017) 15 g 1`   No current facility-administered medications on file prior to visit.     Allergies  Allergen Reactions  . Codeine     REACTION: nausea and vomiting  . Penicillins Nausea And Vomiting    REACTION: rash    Past Medical History:  Diagnosis Date  . Allergy   . Anxiety   . CAD (coronary artery disease)   . Cataract   . Hemorrhoids   . Hypertension   . Mitral valve prolapse   . Osteoporosis   . Status post dilation of esophageal narrowing     Past Surgical History:  Procedure Laterality Date  . BASAL CELL CARCINOMA EXCISION  02/05/2016   Dr. Sarajane Jews, [facility]  . BLADDER SURGERY  11/15/1995   bladder neck suspension, urinary incontinence  . CARDIAC CATHETERIZATION  01/03/2017 DES LAD  . CATARACT EXTRACTION Left 04/30/2016   Dr. Tommy Rainwater, [place] N/A 01/03/2017   Procedure: Coronary Stent Intervention;  Surgeon: Nelva Bush, MD;  Location: [place] [facility];  Service: Cardiovascular;  Laterality: N/A;  . DENTAL SURGERY  06/20/2016   Tooth Implant; Dr. Kalman Shan  . INTRAVASCULAR ULTRASOUND/IVUS N/A 01/03/2017   Procedure: Intravascular Ultrasound/IVUS;  Surgeon: Nelva Bush, MD;  Location: [place] [facility];  Service: Cardiovascular;  Laterality: N/A;  . KNEE SURGERY  2003   torn meniscus  . LEFT HEART CATH AND CORONARY ANGIOGRAPHY N/A 01/03/2017   Procedure: Left Heart Cath and Coronary Angiography;  Surgeon: Nelva Bush, MD;  Location: [place] [facility];  Service: Cardiovascular;  Laterality: N/A;  . MOHS SURGERY Right 2017   right  side of face  . ROTATOR CUFF REPAIR    . SHOULDER SURGERY Right 2005    Family History  Problem Relation Age of Onset  . Mitral valve prolapse Mother   . Hypertension Father   . Brain cancer Father     Social History   Socioeconomic History  . Marital status: Divorced    Spouse name: Not on file  . Number of children: 2  . Years of education: Not on file  . Highest education level: Not on file  Social Needs  . Financial resource strain: Not on file  . Food insecurity - worry: Not on file  . Food insecurity - inability: Not on file  . Transportation needs - medical: Not on file  . Transportation needs - non-medical: Not on file  Occupational History  . Occupation: Medical office---front desk    Comment: Retired  Tobacco Use  . Smoking status: Never Smoker  . Smokeless tobacco: Never Used  Substance and Sexual Activity  . Alcohol use: No    Alcohol/week: 0.0 oz  . Drug use: No  . Sexual activity: Yes  Other Topics Concern  . Not on file  Social History Narrative   Has a living will.   HPOA- daughter.   She would desire CPR.  Would desire life support if necessary and not futile.         Review of Systems  Back in cardio rehab No fevers Not sick     Objective:   Physical Exam  Musculoskeletal: She exhibits no edema.  No ankle swelling No right malleolar tenderness Normal passive ROM without pain at ankle Dorsi/plantar flexion of foot normal Pain area is posterior to medial malleolus          Assessment & Plan:

## 2017-10-24 NOTE — Patient Instructions (Signed)
Please try a lace up or velcro brace for your right ankle (or combination)---like available at a sports store. If the pain continues, set up an appointment with Dr Lorelei Pont, our sports medicine doctor

## 2017-10-24 NOTE — Assessment & Plan Note (Signed)
No bony problems and walks for a while without pain Probably soft tissue Discussed using ankle brace If continues, will set up with sports medicine Tramadol prn

## 2017-10-26 ENCOUNTER — Encounter (HOSPITAL_COMMUNITY)
Admission: RE | Admit: 2017-10-26 | Discharge: 2017-10-26 | Disposition: A | Discharge status: Home / Self Care | Payer: Self-pay | Source: Ambulatory Visit | Attending: Cardiology | Admitting: Cardiology

## 2017-10-28 ENCOUNTER — Encounter (HOSPITAL_COMMUNITY): Payer: Self-pay

## 2017-10-28 ENCOUNTER — Other Ambulatory Visit: Payer: Self-pay

## 2017-10-31 ENCOUNTER — Encounter (HOSPITAL_COMMUNITY)
Admission: RE | Admit: 2017-10-31 | Discharge: 2017-10-31 | Disposition: A | Discharge status: Home / Self Care | Payer: Self-pay | Source: Ambulatory Visit | Attending: Cardiology | Admitting: Cardiology

## 2017-11-01 MED ORDER — ATORVASTATIN CALCIUM 80 MG PO TABS: 80.0000 mg | ORAL_TABLET | Freq: Every day | ORAL | 1 refills | Status: DC

## 2017-11-02 ENCOUNTER — Ambulatory Visit: Payer: Self-pay

## 2017-11-02 ENCOUNTER — Encounter (HOSPITAL_COMMUNITY): Payer: Self-pay

## 2017-11-02 ENCOUNTER — Encounter: Payer: Self-pay | Admitting: Internal Medicine

## 2017-11-02 ENCOUNTER — Ambulatory Visit (INDEPENDENT_AMBULATORY_CARE_PROVIDER_SITE_OTHER): Payer: PPO | Admitting: Internal Medicine

## 2017-11-02 ENCOUNTER — Ambulatory Visit: Payer: PPO | Admitting: Family Medicine

## 2017-11-02 VITALS — BP 118/76 | HR 70 | Temp 98.4°F | Wt 144.0 lb

## 2017-11-02 DIAGNOSIS — T7840XA Allergy, unspecified, initial encounter: Secondary | ICD-10-CM | POA: Diagnosis not present

## 2017-11-02 MED ORDER — PREDNISONE 20 MG PO TABS: 40.0000 mg | ORAL_TABLET | Freq: Every day | ORAL | 0 refills | Status: DC

## 2017-11-02 MED ORDER — METHYLPREDNISOLONE ACETATE 80 MG/ML IJ SUSP
80.0000 mg | Freq: Once | INTRAMUSCULAR | Status: AC
  Administered 2017-11-02: 80 mg via INTRAMUSCULAR

## 2017-11-02 NOTE — Telephone Encounter (Signed)
You can add her on at 12:30 if she hasn't already gone to urgent care

## 2017-11-02 NOTE — Progress Notes (Signed)
Subjective:    Patient ID: Hannah Vasquez, female    DOB: 08-26-1950, 67 y.o.   MRN: [id number]  HPI Here due to rash on face Similar to January  This started yesterday--face red and hot Then noticed the swelling in eyes this morning  Sore area at right upper lip---tried bactroban on it 3 nights ago Did use it once in January as well  No mouth or tongue swelling  Current Outpatient Medications on File Prior to Visit  Medication Sig Dispense Refill  . albuterol (PROVENTIL HFA;VENTOLIN HFA) 108 (90 Base) MCG/ACT inhaler Inhale 1 puff into the lungs every 6 (six) hours as needed for wheezing or shortness of breath.    Marland Kitchen aspirin EC 81 MG tablet Take 81 mg by mouth every morning.     Marland Kitchen atorvastatin (LIPITOR) 80 MG tablet Take 1 tablet (80 mg total) by mouth daily at 6 PM. 30 tablet 1  . Cholecalciferol (VITAMIN D3) 1000 UNITS CAPS Take 1 capsule by mouth every morning.     . fluticasone (FLONASE) 50 MCG/ACT nasal spray Place into both nostrils daily.    . furosemide (LASIX) 20 MG tablet Take 1 tablet (20 mg total) by mouth daily as needed for edema. 90 tablet 3  . isosorbide mononitrate (IMDUR) 30 MG 24 hr tablet Take 1 tablet (30 mg total) by mouth daily. 90 tablet 3  . loratadine (CLARITIN) 10 MG tablet Take 1 tablet (10 mg total) by mouth every morning. 30 tablet 2  . losartan (COZAAR) 50 MG tablet Take 50 mg daily by mouth.    . metoprolol succinate (TOPROL-XL) 25 MG 24 hr tablet Take 1 tablet (25 mg total) by mouth daily. 30 tablet 0  . Multiple Vitamin (MULTIVITAMIN) tablet Take 1 tablet by mouth every morning.     . nitroGLYCERIN (NITROSTAT) 0.4 MG SL tablet Place 1 tablet (0.4 mg total) under the tongue every 5 (five) minutes as needed for chest pain. 25 tablet 3  . Omega-3 Fatty Acids (FISH OIL) 1200 MG CAPS Take 1 capsule by mouth every morning.     Marland Kitchen omeprazole (PRILOSEC) 40 MG capsule Take 40 mg by mouth daily.     . potassium chloride (K-DUR) 10 MEQ tablet Take 10 mEq daily  as needed by mouth. If taking Furosemide (Lasix)    . prasugrel (EFFIENT) 10 MG TABS tablet Take 10 mg by mouth daily.    . traMADol (ULTRAM) 50 MG tablet Take 1 tablet (50 mg total) by mouth every 8 (eight) hours as needed (pain). 30 tablet 0  . mupirocin cream (BACTROBAN) 2 % Apply 1 application topically 2 (two) times daily. (Patient not taking: Reported on 10/24/2017) 15 g 1`   No current facility-administered medications on file prior to visit.     Allergies  Allergen Reactions  . Codeine     REACTION: nausea and vomiting  . Penicillins Nausea And Vomiting    REACTION: rash    Past Medical History:  Diagnosis Date  . Allergy   . Anxiety   . CAD (coronary artery disease)   . Cataract   . Hemorrhoids   . Hypertension   . Mitral valve prolapse   . Osteoporosis   . Status post dilation of esophageal narrowing     Past Surgical History:  Procedure Laterality Date  . BASAL CELL CARCINOMA EXCISION  02/05/2016   Dr. Sarajane Jews, [facility]  . BLADDER SURGERY  11/15/1995   bladder neck suspension, urinary incontinence  . CARDIAC CATHETERIZATION  01/03/2017 DES LAD  . CATARACT EXTRACTION Left 04/30/2016   Dr. Tommy Rainwater, [place] N/A 01/03/2017   Procedure: Coronary Stent Intervention;  Surgeon: Nelva Bush, MD;  Location: [place] [facility];  Service: Cardiovascular;  Laterality: N/A;  . DENTAL SURGERY  06/20/2016   Tooth Implant; Dr. Kalman Shan  . INTRAVASCULAR ULTRASOUND/IVUS N/A 01/03/2017   Procedure: Intravascular Ultrasound/IVUS;  Surgeon: Nelva Bush, MD;  Location: [place] [facility];  Service: Cardiovascular;  Laterality: N/A;  . KNEE SURGERY  2003   torn meniscus  . LEFT HEART CATH AND CORONARY ANGIOGRAPHY N/A 01/03/2017   Procedure: Left Heart Cath and Coronary Angiography;  Surgeon: Nelva Bush, MD;  Location: [place] [facility];  Service: Cardiovascular;  Laterality: N/A;  . MOHS SURGERY Right 2017   right side of  face  . ROTATOR CUFF REPAIR    . SHOULDER SURGERY Right 2005    Family History  Problem Relation Age of Onset  . Mitral valve prolapse Mother   . Hypertension Father   . Brain cancer Father     Social History   Socioeconomic History  . Marital status: Divorced    Spouse name: Not on file  . Number of children: 2  . Years of education: Not on file  . Highest education level: Not on file  Social Needs  . Financial resource strain: Not on file  . Food insecurity - worry: Not on file  . Food insecurity - inability: Not on file  . Transportation needs - medical: Not on file  . Transportation needs - non-medical: Not on file  Occupational History  . Occupation: Medical office---front desk    Comment: Retired  Tobacco Use  . Smoking status: Never Smoker  . Smokeless tobacco: Never Used  Substance and Sexual Activity  . Alcohol use: No    Alcohol/week: 0.0 oz  . Drug use: No  . Sexual activity: Yes  Other Topics Concern  . Not on file  Social History Narrative   Has a living will.   HPOA- daughter.   She would desire CPR.  Would desire life support if necessary and not futile.         Review of Systems No fever No SOB    Objective:   Physical Exam  Constitutional: No distress.  HENT:  Redness of cheeks, eyelids (with swelling) and forehead Warm (has itching) No oral or tongue lesions  Neck: No thyromegaly present.  Pulmonary/Chest: Effort normal and breath sounds normal. No respiratory distress. She has no wheezes. She has no rales.  Lymphadenopathy:    She has no cervical adenopathy.          Assessment & Plan:

## 2017-11-02 NOTE — Assessment & Plan Note (Addendum)
Same type of reaction as January Timing implicates the bactroban---put on allergy list depomedrol 80 IM now Prednisone x 6 days

## 2017-11-02 NOTE — Telephone Encounter (Signed)
Received skype from [facility]; Good morning Hannah Vasquez. Can you work in a pt. this morning. MRN [id number]. Dr. Silvio Pate pt. Has a rash to face, neck ,chest. Eyes are swollen. Had this in Jan. and saw Dr. Damita Dunnings then. Thanks. Pt was not having any mouth, tongue or throat swelling, no problem swallowing or difficulty breathing; [name] was to offer pt appt this afternoon and if condition changed or worsened to cb or go to UC. FYI to Dr Silvio Pate.

## 2017-11-02 NOTE — Telephone Encounter (Signed)
I spoke with pt and she took appt with Dr Silvio Pate today at 12:30.

## 2017-11-02 NOTE — Telephone Encounter (Signed)
Hyperlipidemia  - Continue with high intensity statin therapy. LDL 44, no myalgias.  Per Dr Marlou Porch 08/24/2017 office visit note.  OK to refill as requested.

## 2017-11-02 NOTE — Telephone Encounter (Signed)
Pt. Reports she had this same rash in January and was treated by Dr. Damita Dunnings. States it went away with Prednisone. Has no idea what is causing this. States her face is red like a sunburn - is on her neck and chest as well. Eyes are swollen. No difficulty breathing or swallowing. Request to be seen this morning if possible. Reason for Disposition . [1] Looks infected (spreading redness, pus) AND [2] no fever  Answer Assessment - Initial Assessment Questions 1. APPEARANCE of RASH: "Describe the rash."      Red, face looks like a sunburn 2. LOCATION: "Where is the rash located?"      Face,neck,chest and eyes are swollen 3. NUMBER: "How many spots are there?"      N/A 4. SIZE: "How big are the spots?" (Inches, centimeters or compare to size of a coin)      n/a 5. ONSET: "When did the rash start?"      Started overnight 6. ITCHING: "Does the rash itch?" If so, ask: "How bad is the itch?"  (Scale 1-10; or mild, moderate, severe)     Severe 7. PAIN: "Does the rash hurt?" If so, ask: "How bad is the pain?"  (Scale 1-10; or mild, moderate, severe)     No 8. OTHER SYMPTOMS: "Do you have any other symptoms?" (e.g., fever)     No 9. PREGNANCY: "Is there any chance you are pregnant?" "When was your last menstrual period?"     No  Protocols used: RASH OR REDNESS - LOCALIZED-A-AH

## 2017-11-02 NOTE — Addendum Note (Signed)
Addended by: Pilar Grammes on: 11/02/2017 04:20 PM   Modules accepted: Orders

## 2017-11-04 ENCOUNTER — Encounter (HOSPITAL_COMMUNITY): Payer: Self-pay

## 2017-11-07 ENCOUNTER — Encounter (HOSPITAL_COMMUNITY)
Admission: RE | Admit: 2017-11-07 | Discharge: 2017-11-07 | Disposition: A | Discharge status: Home / Self Care | Payer: Self-pay | Source: Ambulatory Visit | Attending: Cardiology | Admitting: Cardiology

## 2017-11-09 ENCOUNTER — Encounter (HOSPITAL_COMMUNITY)
Admission: RE | Admit: 2017-11-09 | Discharge: 2017-11-09 | Disposition: A | Discharge status: Home / Self Care | Payer: Self-pay | Source: Ambulatory Visit | Attending: Cardiology | Admitting: Cardiology

## 2017-11-11 ENCOUNTER — Encounter (HOSPITAL_COMMUNITY)
Admission: RE | Admit: 2017-11-11 | Discharge: 2017-11-11 | Disposition: A | Discharge status: Home / Self Care | Payer: Self-pay | Source: Ambulatory Visit | Attending: Cardiology | Admitting: Cardiology

## 2017-11-14 ENCOUNTER — Encounter (HOSPITAL_COMMUNITY)
Admission: RE | Admit: 2017-11-14 | Discharge: 2017-11-14 | Disposition: A | Discharge status: Home / Self Care | Payer: Self-pay | Source: Ambulatory Visit | Attending: Cardiology | Admitting: Cardiology

## 2017-11-14 DIAGNOSIS — I2510 Atherosclerotic heart disease of native coronary artery without angina pectoris: Secondary | ICD-10-CM | POA: Insufficient documentation

## 2017-11-14 DIAGNOSIS — I10 Essential (primary) hypertension: Secondary | ICD-10-CM | POA: Insufficient documentation

## 2017-11-14 DIAGNOSIS — I2583 Coronary atherosclerosis due to lipid rich plaque: Secondary | ICD-10-CM | POA: Insufficient documentation

## 2017-11-14 DIAGNOSIS — R0609 Other forms of dyspnea: Secondary | ICD-10-CM | POA: Insufficient documentation

## 2017-11-14 DIAGNOSIS — R5383 Other fatigue: Secondary | ICD-10-CM | POA: Insufficient documentation

## 2017-11-16 ENCOUNTER — Encounter (HOSPITAL_COMMUNITY): Payer: Self-pay

## 2017-11-16 ENCOUNTER — Encounter (HOSPITAL_COMMUNITY)
Admission: RE | Admit: 2017-11-16 | Discharge: 2017-11-16 | Disposition: A | Discharge status: Home / Self Care | Payer: Self-pay | Source: Ambulatory Visit | Attending: Nurse Practitioner | Admitting: Nurse Practitioner

## 2017-11-18 ENCOUNTER — Encounter (HOSPITAL_COMMUNITY)
Admission: RE | Admit: 2017-11-18 | Discharge: 2017-11-18 | Disposition: A | Discharge status: Home / Self Care | Payer: Self-pay | Source: Ambulatory Visit | Attending: Nurse Practitioner | Admitting: Nurse Practitioner

## 2017-11-18 ENCOUNTER — Encounter (HOSPITAL_COMMUNITY): Payer: Self-pay

## 2017-11-21 ENCOUNTER — Encounter (HOSPITAL_COMMUNITY)
Admission: RE | Admit: 2017-11-21 | Discharge: 2017-11-21 | Disposition: A | Discharge status: Home / Self Care | Payer: Self-pay | Source: Ambulatory Visit | Attending: Cardiology | Admitting: Cardiology

## 2017-11-23 ENCOUNTER — Encounter: Payer: Self-pay | Admitting: Internal Medicine

## 2017-11-23 ENCOUNTER — Encounter (HOSPITAL_COMMUNITY)
Admission: RE | Admit: 2017-11-23 | Discharge: 2017-11-23 | Disposition: A | Discharge status: Home / Self Care | Payer: Self-pay | Source: Ambulatory Visit | Attending: Cardiology | Admitting: Cardiology

## 2017-11-23 ENCOUNTER — Ambulatory Visit (INDEPENDENT_AMBULATORY_CARE_PROVIDER_SITE_OTHER): Payer: PPO | Admitting: Internal Medicine

## 2017-11-23 DIAGNOSIS — I2583 Coronary atherosclerosis due to lipid rich plaque: Secondary | ICD-10-CM

## 2017-11-23 DIAGNOSIS — I2510 Atherosclerotic heart disease of native coronary artery without angina pectoris: Secondary | ICD-10-CM

## 2017-11-23 NOTE — Progress Notes (Signed)
Subjective:    Patient ID: Hannah Vasquez, female    DOB: [birth year], 67 y.o.   MRN: [id number]  HPI Here for follow up with heart issues  Still doing cardiac rehab Progressing fairly well Will see cardiologist next month Still stays tired-- "I don't get as much done" Feels like she is in "slow motion" No chest pain or SOB Occasional dizziness---- sometimes will "trip over my own feet"  Current Outpatient Medications on File Prior to Visit  Medication Sig Dispense Refill  . albuterol (PROVENTIL HFA;VENTOLIN HFA) 108 (90 Base) MCG/ACT inhaler Inhale 1 puff into the lungs every 6 (six) hours as needed for wheezing or shortness of breath.    Marland Kitchen aspirin EC 81 MG tablet Take 81 mg by mouth every morning.     Marland Kitchen atorvastatin (LIPITOR) 80 MG tablet Take 1 tablet (80 mg total) by mouth daily at 6 PM. 30 tablet 1  . Cholecalciferol (VITAMIN D3) 1000 UNITS CAPS Take 1 capsule by mouth every morning.     . fluticasone (FLONASE) 50 MCG/ACT nasal spray Place into both nostrils daily.    . furosemide (LASIX) 20 MG tablet Take 1 tablet (20 mg total) by mouth daily as needed for edema. 90 tablet 3  . isosorbide mononitrate (IMDUR) 30 MG 24 hr tablet Take 1 tablet (30 mg total) by mouth daily. 90 tablet 3  . loratadine (CLARITIN) 10 MG tablet Take 1 tablet (10 mg total) by mouth every morning. 30 tablet 2  . losartan (COZAAR) 50 MG tablet Take 50 mg daily by mouth.    . metoprolol succinate (TOPROL-XL) 25 MG 24 hr tablet Take 1 tablet (25 mg total) by mouth daily. 30 tablet 0  . Multiple Vitamin (MULTIVITAMIN) tablet Take 1 tablet by mouth every morning.     . nitroGLYCERIN (NITROSTAT) 0.4 MG SL tablet Place 1 tablet (0.4 mg total) under the tongue every 5 (five) minutes as needed for chest pain. 25 tablet 3  . Omega-3 Fatty Acids (FISH OIL) 1200 MG CAPS Take 1 capsule by mouth every morning.     Marland Kitchen omeprazole (PRILOSEC) 40 MG capsule Take 40 mg by mouth daily.     . potassium chloride (K-DUR) 10 MEQ  tablet Take 10 mEq daily as needed by mouth. If taking Furosemide (Lasix)    . prasugrel (EFFIENT) 10 MG TABS tablet Take 10 mg by mouth daily.    . traMADol (ULTRAM) 50 MG tablet Take 1 tablet (50 mg total) by mouth every 8 (eight) hours as needed (pain). 30 tablet 0   No current facility-administered medications on file prior to visit.     Allergies  Allergen Reactions  . Bactroban [Mupirocin Calcium]     Facial redness and swelling  . Codeine     REACTION: nausea and vomiting  . Penicillins Nausea And Vomiting    REACTION: rash    Past Medical History:  Diagnosis Date  . Allergy   . Anxiety   . CAD (coronary artery disease)   . Cataract   . Hemorrhoids   . Hypertension   . Mitral valve prolapse   . Osteoporosis   . Status post dilation of esophageal narrowing     Past Surgical History:  Procedure Laterality Date  . BASAL CELL CARCINOMA EXCISION  02/05/2016   Dr. Sarajane Jews, [facility]  . BLADDER SURGERY  11/15/1995   bladder neck suspension, urinary incontinence  . CARDIAC CATHETERIZATION  01/03/2017 DES LAD  . CATARACT EXTRACTION Left 04/30/2016  Dr. Tommy Rainwater, [place] N/A 01/03/2017   Procedure: Coronary Stent Intervention;  Surgeon: Nelva Bush, MD;  Location: [place] [facility];  Service: Cardiovascular;  Laterality: N/A;  . DENTAL SURGERY  06/20/2016   Tooth Implant; Dr. Kalman Shan  . INTRAVASCULAR ULTRASOUND/IVUS N/A 01/03/2017   Procedure: Intravascular Ultrasound/IVUS;  Surgeon: Nelva Bush, MD;  Location: [place] [facility];  Service: Cardiovascular;  Laterality: N/A;  . KNEE SURGERY  2003   torn meniscus  . LEFT HEART CATH AND CORONARY ANGIOGRAPHY N/A 01/03/2017   Procedure: Left Heart Cath and Coronary Angiography;  Surgeon: Nelva Bush, MD;  Location: [place] [facility];  Service: Cardiovascular;  Laterality: N/A;  . MOHS SURGERY Right 2017   right side of face  . ROTATOR CUFF REPAIR    . SHOULDER  SURGERY Right 2005    Family History  Problem Relation Age of Onset  . Mitral valve prolapse Mother   . Hypertension Father   . Brain cancer Father     Social History   Socioeconomic History  . Marital status: Divorced    Spouse name: Not on file  . Number of children: 2  . Years of education: Not on file  . Highest education level: Not on file  Occupational History  . 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 Financial resource strain: Not on file  . Food insecurity:    Worry: Not on file    Inability: Not on file  . Transportation needs:    Medical: Not on file    Non-medical: Not on file  Tobacco Use  . Smoking status: Never Smoker  . Smokeless tobacco: Never Used  Substance and Sexual Activity  . Alcohol use: No    Alcohol/week: 0.0 oz  . Drug use: No  . Sexual activity: Yes  Lifestyle  . Physical activity:    Days per week: Not on file    Minutes per session: Not on file  . Stress: Not on file  Relationships  . Social connections:    Talks on phone: Not on file    Gets together: Not on file    Attends religious service: Not on file    Active member of club or organization: Not on file    Attends meetings of clubs or organizations: Not on file    Relationship status: Not on file  . Intimate partner violence:    Fear of current or ex partner: Not on file    Emotionally abused: Not on file    Physically abused: Not on file    Forced sexual activity: Not on file  Other Topics Concern  . Not on file  Social History Narrative   Has a living will.   HPOA- daughter.   She would desire CPR.  Would desire life support if necessary and not futile.         Review of Systems No recurrence of rash/allergic reaction on face Sleeps fairly well. Generally up 5:30AM and has best energy in morning--then energy gone Goes to bed very early--like 7:30PM, then up after a few hours (and up for 1-2 hours) Appetite is good Weight is  stable now. Has lost at least 10# since eating better with the nutrition education    Objective:   Physical Exam  Constitutional: She appears well-developed. No distress.  Neck: No thyromegaly present.  Cardiovascular: Normal rate, regular rhythm and normal heart sounds. Exam reveals no gallop.  No murmur heard. Pulmonary/Chest: Effort normal and breath sounds normal. No respiratory distress. She has no wheezes. She has no rales.  Musculoskeletal: She exhibits no edema.  Lymphadenopathy:    She has no cervical adenopathy.          Assessment & Plan:

## 2017-11-23 NOTE — Patient Instructions (Signed)
Please try to stay up till about 9PM and don't get out of bed. If you continue to have trouble sleeping all night, you can try melatonin, up to 3-87m at bedtime (to see if that gives you a more complete night's rest)

## 2017-11-23 NOTE — Assessment & Plan Note (Signed)
No clear cardiac symptoms Tiredness may be related to sleep problems Discussed going to bed later--like 9PM and staying in bed Consider melatonin No change in cardiac meds Will follow up with cardiology

## 2017-11-25 ENCOUNTER — Encounter (HOSPITAL_COMMUNITY)
Admission: RE | Admit: 2017-11-25 | Discharge: 2017-11-25 | Disposition: A | Discharge status: Home / Self Care | Payer: Self-pay | Source: Ambulatory Visit | Attending: Cardiology | Admitting: Cardiology

## 2017-11-28 ENCOUNTER — Encounter (HOSPITAL_COMMUNITY): Payer: Self-pay

## 2017-11-30 ENCOUNTER — Encounter (HOSPITAL_COMMUNITY)
Admission: RE | Admit: 2017-11-30 | Discharge: 2017-11-30 | Disposition: A | Discharge status: Home / Self Care | Payer: Self-pay | Source: Ambulatory Visit | Attending: Cardiology | Admitting: Cardiology

## 2017-12-02 ENCOUNTER — Encounter (HOSPITAL_COMMUNITY)
Admission: RE | Admit: 2017-12-02 | Discharge: 2017-12-02 | Disposition: A | Discharge status: Home / Self Care | Payer: Self-pay | Source: Ambulatory Visit | Attending: Cardiology | Admitting: Cardiology

## 2017-12-05 ENCOUNTER — Encounter: Payer: Self-pay | Admitting: Nurse Practitioner

## 2017-12-05 ENCOUNTER — Encounter (HOSPITAL_COMMUNITY): Payer: Self-pay

## 2017-12-05 ENCOUNTER — Ambulatory Visit (INDEPENDENT_AMBULATORY_CARE_PROVIDER_SITE_OTHER): Payer: PPO | Admitting: Nurse Practitioner

## 2017-12-05 VITALS — BP 138/90 | HR 66 | Ht 62.0 in | Wt 145.4 lb

## 2017-12-05 DIAGNOSIS — I5022 Chronic systolic (congestive) heart failure: Secondary | ICD-10-CM

## 2017-12-05 DIAGNOSIS — R0602 Shortness of breath: Secondary | ICD-10-CM | POA: Diagnosis not present

## 2017-12-05 DIAGNOSIS — E7800 Pure hypercholesterolemia, unspecified: Secondary | ICD-10-CM

## 2017-12-05 DIAGNOSIS — I2510 Atherosclerotic heart disease of native coronary artery without angina pectoris: Secondary | ICD-10-CM

## 2017-12-05 DIAGNOSIS — I10 Essential (primary) hypertension: Secondary | ICD-10-CM | POA: Diagnosis not present

## 2017-12-05 LAB — PRO B NATRIURETIC PEPTIDE: NT-Pro BNP: 91 pg/mL (ref 0–301)

## 2017-12-05 LAB — BASIC METABOLIC PANEL
BUN/Creatinine Ratio: 13 (ref 12–28)
BUN: 9 mg/dL (ref 8–27)
CO2: 26 mmol/L (ref 20–29)
Calcium: 9.6 mg/dL (ref 8.7–10.3)
Chloride: 103 mmol/L (ref 96–106)
Creatinine, Ser: 0.68 mg/dL (ref 0.57–1.00)
GFR calc Af Amer: 103 mL/min/{1.73_m2} (ref >59)
GFR calc non Af Amer: 90 mL/min/{1.73_m2} (ref >59)
Glucose: 100 mg/dL — ABNORMAL HIGH (ref 65–99)
Potassium: 3.7 mmol/L (ref 3.5–5.2)
Sodium: 142 mmol/L (ref 134–144)

## 2017-12-05 LAB — CBC
Hematocrit: 38.5 % (ref 34.0–46.6)
Hemoglobin: 13.7 g/dL (ref 11.1–15.9)
MCH: 27.8 pg (ref 26.6–33.0)
MCHC: 35.6 g/dL (ref 31.5–35.7)
MCV: 78 fL — ABNORMAL LOW (ref 79–97)
Platelets: 313 10*3/uL (ref 150–379)
RBC: 4.92 x10E6/uL (ref 3.77–5.28)
RDW: 15.3 % (ref 12.3–15.4)
WBC: 10.1 10*3/uL (ref 3.4–10.8)

## 2017-12-05 LAB — HEPATIC FUNCTION PANEL
ALT: 23 IU/L (ref 0–32)
AST: 20 IU/L (ref 0–40)
Albumin: 4.1 g/dL (ref 3.6–4.8)
Alkaline Phosphatase: 166 IU/L — ABNORMAL HIGH (ref 39–117)
Bilirubin Total: 0.7 mg/dL (ref 0.0–1.2)
Bilirubin, Direct: 0.17 mg/dL (ref 0.00–0.40)
Total Protein: 6.6 g/dL (ref 6.0–8.5)

## 2017-12-05 LAB — LIPID PANEL
Chol/HDL Ratio: 3 ratio (ref 0.0–4.4)
Cholesterol, Total: 117 mg/dL (ref 100–199)
HDL: 39 mg/dL — ABNORMAL LOW (ref >39)
LDL Calculated: 63 mg/dL (ref 0–99)
Triglycerides: 76 mg/dL (ref 0–149)
VLDL Cholesterol Cal: 15 mg/dL (ref 5–40)

## 2017-12-05 NOTE — Patient Instructions (Addendum)
We will be checking the following labs today - BMET, CBC, HPF, Lipids and BNP   Medication Instructions:    Continue with your current medicines.     Testing/Procedures To Be Arranged:  Cardiopulmonary stress testing  Follow-Up:   See Dr. Marlou Porch in about 4 to 6 weeks for discussion    Other Special Instructions:   N/A    If you need a refill on your cardiac medications before your next appointment, please call your pharmacy.   Call the [place] office at [phone] if you have any questions, problems or concerns.

## 2017-12-05 NOTE — Progress Notes (Signed)
CARDIOLOGY OFFICE NOTE  Date:  12/05/2017    Hannah Vasquez Date of Birth: 1951/04/19 Medical Record #[id number]  PCP:  Venia Carbon, MD  Cardiologist:  Marisa Cyphers  Chief Complaint  Patient presents with  . Coronary Artery Disease    3 month check - seen for Dr. Marlou Porch    History of Present Illness: Hannah Vasquez is a 67 y.o. female who presents today for a 3 month check. Seen for Dr. Marlou Porch.   She has known CAD with prior history of ST elevation myocardial infarction on 01/01/17 stating that she had chest pressure that waxed and waned then returned, troponin was 0.16. She  received 2 stents to the LAD.  EF of 40-45%. Residual small ramus dz and jailed diag. Other issues as noted below.   Admitted back in January of this year for atypical chest pain, fatigue, SOB.  Dr. Harl Bowie saw her in consultation.  She hadshad ongoing fatigue since her stents were placed in May 2018. Change off of Brilinta to Plavix did not help. Now on Effient.   Troponins were negative.  X-ray negative.  EKG with nonspecific ST-T wave changes.  Beta-blocker was reduced by cardiology team.  There was question of possible obstructive sleep apnea. She had been on Valtrex for shingles - and this was DC'd due to possible etiology of fatigue.  Her ejection fraction has now normalized after echocardiogram repeat in July 2018.  She has seen pulmonary earlier this year.   Comes in today. Here alone. She notes she still does not feel that well. Energy level continues to be an issue - she will have some "bursts" of energy but then just wants to sit down and can go to sleep. Remains short of breath - she feels it is "all related to my pulmonary issues" - but I do not see where she has an actual pulmonary diagnosis. Dr. Lamonte Sakai wanted to consider CPX testing on return visit. She is trying to stay active - does not sound like she has actual exercise plan for the days she is not at cardiac rehab. Weight is unchanged. No  real swelling. She is fasting today. She reiterates several times that she wishes she felt better overall.   Past Medical History:  Diagnosis Date  . Allergy   . Anxiety   . CAD (coronary artery disease)   . Cataract   . Hemorrhoids   . Hypertension   . Mitral valve prolapse   . Osteoporosis   . Status post dilation of esophageal narrowing     Past Surgical History:  Procedure Laterality Date  . BASAL CELL CARCINOMA EXCISION  02/05/2016   Dr. Sarajane Jews, [facility]  . BLADDER SURGERY  11/15/1995   bladder neck suspension, urinary incontinence  . CARDIAC CATHETERIZATION  01/03/2017 DES LAD  . CATARACT EXTRACTION Left 04/30/2016   Dr. Tommy Rainwater, [place] N/A 01/03/2017   Procedure: Coronary Stent Intervention;  Surgeon: Nelva Bush, MD;  Location: [place] [facility];  Service: Cardiovascular;  Laterality: N/A;  . DENTAL SURGERY  06/20/2016   Tooth Implant; Dr. Kalman Shan  . INTRAVASCULAR ULTRASOUND/IVUS N/A 01/03/2017   Procedure: Intravascular Ultrasound/IVUS;  Surgeon: Nelva Bush, MD;  Location: [place] [facility];  Service: Cardiovascular;  Laterality: N/A;  . KNEE SURGERY  2003   torn meniscus  . LEFT HEART CATH AND CORONARY ANGIOGRAPHY N/A 01/03/2017   Procedure: Left Heart Cath and Coronary Angiography;  Surgeon: Nelva Bush,  MD;  Location: [place] [facility];  Service: Cardiovascular;  Laterality: N/A;  . MOHS SURGERY Right 2017   right side of face  . ROTATOR CUFF REPAIR    . SHOULDER SURGERY Right 2005     Medications: Current Meds  Medication Sig  . albuterol (PROVENTIL HFA;VENTOLIN HFA) 108 (90 Base) MCG/ACT inhaler Inhale 1 puff into the lungs every 6 (six) hours as needed for wheezing or shortness of breath.  Marland Kitchen aspirin EC 81 MG tablet Take 81 mg by mouth every morning.   Marland Kitchen atorvastatin (LIPITOR) 80 MG tablet Take 1 tablet (80 mg total) by mouth daily at 6 PM.  . Cholecalciferol (VITAMIN D3) 1000 UNITS CAPS  Take 1 capsule by mouth every morning.   . fluticasone (FLONASE) 50 MCG/ACT nasal spray Place into both nostrils daily.  . furosemide (LASIX) 20 MG tablet Take 1 tablet (20 mg total) by mouth daily as needed for edema.  . isosorbide mononitrate (IMDUR) 30 MG 24 hr tablet Take 1 tablet (30 mg total) by mouth daily.  Marland Kitchen loratadine (CLARITIN) 10 MG tablet Take 1 tablet (10 mg total) by mouth every morning.  Marland Kitchen losartan (COZAAR) 50 MG tablet Take 50 mg daily by mouth.  . metoprolol succinate (TOPROL-XL) 25 MG 24 hr tablet Take 1 tablet (25 mg total) by mouth daily.  . Multiple Vitamin (MULTIVITAMIN) tablet Take 1 tablet by mouth every morning.   . nitroGLYCERIN (NITROSTAT) 0.4 MG SL tablet Place 1 tablet (0.4 mg total) under the tongue every 5 (five) minutes as needed for chest pain.  . Omega-3 Fatty Acids (FISH OIL) 1200 MG CAPS Take 1 capsule by mouth every morning.   Marland Kitchen omeprazole (PRILOSEC) 40 MG capsule Take 40 mg by mouth daily.   . potassium chloride (K-DUR) 10 MEQ tablet Take 10 mEq daily as needed by mouth. If taking Furosemide (Lasix)  . prasugrel (EFFIENT) 10 MG TABS tablet Take 10 mg by mouth daily.  . traMADol (ULTRAM) 50 MG tablet Take 1 tablet (50 mg total) by mouth every 8 (eight) hours as needed (pain).     Allergies: Allergies  Allergen Reactions  . Bactroban [Mupirocin Calcium]     Facial redness and swelling  . Codeine     REACTION: nausea and vomiting  . Penicillins Nausea And Vomiting    REACTION: rash    Social History: The patient  reports that she has never smoked. She has never used smokeless tobacco. She reports that she does not drink alcohol or use drugs.   Family History: The patient's family history includes Brain cancer in her father; Hypertension in her father; Mitral valve prolapse in her mother.   Review of Systems: Please see the history of present illness.   Otherwise, the review of systems is positive for none.   All other systems are reviewed and  negative.   Physical Exam: VS:  BP 138/90 (BP Location: Left Arm, Patient Position: Sitting, Cuff Size: Normal)   Pulse 66   Ht 5' 2"  (1.575 m)   Wt 145 lb 6.4 oz (66 kg)   LMP  (LMP Unknown)   SpO2 98% Comment: at rest  BMI 26.59 kg/m  .  BMI Body mass index is 26.59 kg/m.  Wt Readings from Last 3 Encounters:  12/05/17 145 lb 6.4 oz (66 kg)  11/23/17 144 lb (65.3 kg)  11/02/17 144 lb (65.3 kg)   BP recheck by me is 140/80  General: Pleasant. Well developed, well nourished and in no acute  distress.   HEENT: Normal.  Neck: Supple, no JVD, carotid bruits, or masses noted.  Cardiac: Regular rate and rhythm. No murmurs, rubs, or gallops. No edema.  Respiratory:  Lungs are clear to auscultation bilaterally with normal work of breathing.  GI: Soft and nontender.  MS: No deformity or atrophy. Gait and ROM intact.  Skin: Warm and dry. Color is normal.  Neuro:  Strength and sensation are intact and no gross focal deficits noted.  Psych: Alert, appropriate and with normal affect.   LABORATORY DATA:  EKG:  EKG is not ordered today.  Lab Results  Component Value Date   WBC 9.4 08/21/2017   HGB 12.7 08/21/2017   HCT 39.9 08/21/2017   PLT 323 08/21/2017   GLUCOSE 120 (H) 08/21/2017   CHOL 102 02/28/2017   TRIG 156 (H) 02/28/2017   HDL 27 (L) 02/28/2017   LDLDIRECT 151.0 09/09/2011   LDLCALC 44 02/28/2017   ALT 16 04/21/2017   AST 21 04/21/2017   NA 138 08/21/2017   K 3.9 08/21/2017   CL 106 08/21/2017   CREATININE 0.81 08/21/2017   BUN 12 08/21/2017   CO2 25 08/21/2017   TSH 2.147 08/21/2017   INR 1.12 01/02/2017   HGBA1C 6.1 (H) 01/02/2017     BNP (last 3 results) Recent Labs    08/21/17 2058  BNP 31.2    ProBNP (last 3 results) Recent Labs    01/12/17 1232 01/24/17 1504  PROBNP 417* 310*     Other Studies Reviewed Today:  Cath 01/03/17 Conclusions: 1. Significant 2-vessel coronary artery disease, including diffuse LAD disease with up to 99%  stenosis in the mid portion and TIMI-2 flow, as well as 90% small ramus lesion and 40% proximal/mid LCx disease. 2. Moderately reduced LV contraction (LVEF 35-45%) with anterior and apical akinesis. 3. Moderately elevated left ventricular filling pressure. 4. Successful IVUS-guided PCI to the mid and distal LAD with placement of 2 overlapping Resolute Onyx drug-eluting stents with 0% residual stenosis and TIMI-3 flow.  Recommendations: 1. Dual antiplatelet therapy with aspirin and ticagrelor for at least 12 months, ideally longer. 2. Aggressive secondary prevention. 3. Medical therapy for small ramus intermedius and jailed diagonal branch, as PCI would be challenging given small vessel size. 4. Diuresis and titration of evidence-based heart failure therapy, given moderately reduced LV contraction with elevated filling pressure.   Echocardiogram 01/03/17: -EF 45%  ECHO 02/2017  - EF 65%    ASSESSMENT AND PLAN:  1. CAD with prior Non-ST elevation myocardial infarction 01/03/17  - LAD stents 2, residual ramus disease, jailed diagonal.  - Switched from [place] to [employer]. No significant resolution in shortness of breath symptoms She is getting close to her one year of DAPT and may come off of Effient - however in light of ongoing fatigue/dyspnea - I think it is prudent to proceed with the CPX as recommended by Dr. Lamonte Sakai before we stop her DAPT. ? If she needs a trial of Ranexa to see if her symptoms would improve??? Further disposition to follow.   2. Hyperlipidemia  She is on statin - getting lab toay  3. Shortness of breath No etiology really noted by pulmonary. Her EF has normalized by last echo. Check BNP today. Arranging for CPX.  4. HTN - Fair control. Would follow for now.   Current medicines are reviewed with the patient today.  The patient does not have concerns regarding medicines other than what has been noted above.  The following changes have been  made:  See  above.  Labs/ tests ordered today include:    Orders Placed This Encounter  Procedures  . Cardiopulmonary exercise test  . Basic metabolic panel  . CBC  . Hepatic function panel  . Lipid panel  . Pro b natriuretic peptide (BNP)     Disposition:   FU with Dr. Marlou Porch in 4 to 6 weeks for further discussion.   Patient is agreeable to this plan and will call if any problems develop in the interim.   SignedTruitt Merle, NP  12/05/2017 9:26 AM  [facility] [address] [place], [place]  [address] Phone: [phone] Fax: [phone]

## 2017-12-06 ENCOUNTER — Other Ambulatory Visit: Payer: Self-pay | Admitting: *Deleted

## 2017-12-06 DIAGNOSIS — R748 Abnormal levels of other serum enzymes: Secondary | ICD-10-CM

## 2017-12-07 ENCOUNTER — Encounter (HOSPITAL_COMMUNITY)
Admission: RE | Admit: 2017-12-07 | Discharge: 2017-12-07 | Disposition: A | Discharge status: Home / Self Care | Payer: Self-pay | Source: Ambulatory Visit | Attending: Cardiology | Admitting: Cardiology

## 2017-12-07 ENCOUNTER — Encounter (HOSPITAL_COMMUNITY): Payer: Self-pay

## 2017-12-09 ENCOUNTER — Encounter (HOSPITAL_COMMUNITY)
Admission: RE | Admit: 2017-12-09 | Discharge: 2017-12-09 | Disposition: A | Discharge status: Home / Self Care | Payer: Self-pay | Source: Ambulatory Visit | Attending: Cardiology | Admitting: Cardiology

## 2017-12-12 ENCOUNTER — Encounter (HOSPITAL_COMMUNITY)
Admission: RE | Admit: 2017-12-12 | Discharge: 2017-12-12 | Disposition: A | Discharge status: Home / Self Care | Payer: Self-pay | Source: Ambulatory Visit | Attending: Cardiology | Admitting: Cardiology

## 2017-12-12 ENCOUNTER — Encounter (HOSPITAL_COMMUNITY): Payer: Self-pay

## 2017-12-13 ENCOUNTER — Encounter (HOSPITAL_COMMUNITY)
Admission: RE | Admit: 2017-12-13 | Discharge: 2017-12-13 | Disposition: A | Discharge status: Home / Self Care | Payer: Self-pay | Source: Ambulatory Visit | Attending: Cardiology | Admitting: Cardiology

## 2017-12-14 ENCOUNTER — Encounter (HOSPITAL_COMMUNITY): Payer: Self-pay

## 2017-12-14 DIAGNOSIS — R5383 Other fatigue: Secondary | ICD-10-CM | POA: Insufficient documentation

## 2017-12-14 DIAGNOSIS — I2510 Atherosclerotic heart disease of native coronary artery without angina pectoris: Secondary | ICD-10-CM | POA: Insufficient documentation

## 2017-12-14 DIAGNOSIS — I10 Essential (primary) hypertension: Secondary | ICD-10-CM | POA: Insufficient documentation

## 2017-12-14 DIAGNOSIS — R0609 Other forms of dyspnea: Secondary | ICD-10-CM | POA: Insufficient documentation

## 2017-12-14 DIAGNOSIS — I2583 Coronary atherosclerosis due to lipid rich plaque: Secondary | ICD-10-CM | POA: Insufficient documentation

## 2017-12-15 ENCOUNTER — Other Ambulatory Visit (HOSPITAL_COMMUNITY): Payer: Self-pay | Admitting: *Deleted

## 2017-12-15 ENCOUNTER — Ambulatory Visit (HOSPITAL_COMMUNITY): Payer: PPO | Attending: Nurse Practitioner

## 2017-12-15 DIAGNOSIS — R0602 Shortness of breath: Secondary | ICD-10-CM | POA: Diagnosis not present

## 2017-12-15 DIAGNOSIS — I2510 Atherosclerotic heart disease of native coronary artery without angina pectoris: Secondary | ICD-10-CM

## 2017-12-16 ENCOUNTER — Encounter (HOSPITAL_COMMUNITY): Payer: Self-pay

## 2017-12-16 ENCOUNTER — Encounter (HOSPITAL_COMMUNITY)
Admission: RE | Admit: 2017-12-16 | Discharge: 2017-12-16 | Disposition: A | Discharge status: Home / Self Care | Payer: Self-pay | Source: Ambulatory Visit | Attending: Cardiology | Admitting: Cardiology

## 2017-12-19 ENCOUNTER — Other Ambulatory Visit: Payer: PPO | Admitting: *Deleted

## 2017-12-19 ENCOUNTER — Other Ambulatory Visit: Payer: Self-pay | Admitting: *Deleted

## 2017-12-19 ENCOUNTER — Encounter (HOSPITAL_COMMUNITY)
Admission: RE | Admit: 2017-12-19 | Discharge: 2017-12-19 | Disposition: A | Discharge status: Home / Self Care | Payer: Self-pay | Source: Ambulatory Visit | Attending: Cardiology | Admitting: Cardiology

## 2017-12-19 ENCOUNTER — Encounter (HOSPITAL_COMMUNITY): Payer: Self-pay

## 2017-12-19 DIAGNOSIS — R748 Abnormal levels of other serum enzymes: Secondary | ICD-10-CM

## 2017-12-19 LAB — HEPATIC FUNCTION PANEL
ALT: 17 IU/L (ref 0–32)
AST: 19 IU/L (ref 0–40)
Albumin: 4.1 g/dL (ref 3.6–4.8)
Alkaline Phosphatase: 146 IU/L — ABNORMAL HIGH (ref 39–117)
Bilirubin Total: 0.6 mg/dL (ref 0.0–1.2)
Bilirubin, Direct: 0.2 mg/dL (ref 0.00–0.40)
Total Protein: 6.5 g/dL (ref 6.0–8.5)

## 2017-12-19 MED ORDER — LOSARTAN POTASSIUM 100 MG PO TABS: 100.0000 mg | ORAL_TABLET | Freq: Every day | ORAL | 1 refills | Status: DC

## 2017-12-20 ENCOUNTER — Other Ambulatory Visit: Payer: Self-pay | Admitting: Internal Medicine

## 2017-12-20 ENCOUNTER — Other Ambulatory Visit: Payer: Self-pay | Admitting: Cardiology

## 2017-12-20 MED ORDER — TRAMADOL HCL 50 MG PO TABS: 50.0000 mg | ORAL_TABLET | Freq: Three times a day (TID) | ORAL | 0 refills | Status: DC | PRN

## 2017-12-20 NOTE — Telephone Encounter (Signed)
Requesting refill tramadol to [facility]; Last refilled # 30 on 10/24/17 Last seen 11/23/17. Dr Silvio Pate out of office until 12/22/17.Please advise.

## 2017-12-20 NOTE — Telephone Encounter (Signed)
Refill request Tramadol  LOV 11/23/2017  Pharmacy on file

## 2017-12-20 NOTE — Telephone Encounter (Signed)
Refill sent to pharmacy.   

## 2017-12-20 NOTE — Telephone Encounter (Signed)
Copied from [place] [id number]. Topic: Quick Communication - Rx Refill/Question >> Dec 20, 2017  8:36 AM Arletha Grippe wrote: Medication: traMADol (ULTRAM) 50 MG tablet Has the patient contacted their pharmacy? No. Pain med  (Agent: If no, request that the patient contact the pharmacy for the refill.) Preferred Pharmacy (with phone number or street name): [facility]  Agent: Please be advised that RX refills may take up to 3 business days. We ask that you follow-up with your pharmacy.

## 2017-12-21 ENCOUNTER — Encounter (HOSPITAL_COMMUNITY)
Admission: RE | Admit: 2017-12-21 | Discharge: 2017-12-21 | Disposition: A | Discharge status: Home / Self Care | Payer: Self-pay | Source: Ambulatory Visit | Attending: Cardiology | Admitting: Cardiology

## 2017-12-21 ENCOUNTER — Encounter (HOSPITAL_COMMUNITY): Payer: Self-pay

## 2017-12-23 ENCOUNTER — Encounter (HOSPITAL_COMMUNITY): Payer: Self-pay

## 2017-12-26 ENCOUNTER — Encounter (HOSPITAL_COMMUNITY)
Admission: RE | Admit: 2017-12-26 | Discharge: 2017-12-26 | Disposition: A | Discharge status: Home / Self Care | Payer: Self-pay | Source: Ambulatory Visit | Attending: Cardiology | Admitting: Cardiology

## 2017-12-26 ENCOUNTER — Encounter (HOSPITAL_COMMUNITY): Payer: Self-pay

## 2017-12-28 ENCOUNTER — Encounter (HOSPITAL_COMMUNITY)
Admission: RE | Admit: 2017-12-28 | Discharge: 2017-12-28 | Disposition: A | Discharge status: Home / Self Care | Payer: Self-pay | Source: Ambulatory Visit | Attending: Cardiology | Admitting: Cardiology

## 2017-12-28 ENCOUNTER — Encounter (HOSPITAL_COMMUNITY): Payer: Self-pay

## 2017-12-30 ENCOUNTER — Encounter (HOSPITAL_COMMUNITY): Payer: Self-pay

## 2017-12-30 ENCOUNTER — Encounter (HOSPITAL_COMMUNITY)
Admission: RE | Admit: 2017-12-30 | Discharge: 2017-12-30 | Disposition: A | Discharge status: Home / Self Care | Payer: Self-pay | Source: Ambulatory Visit | Attending: Cardiology | Admitting: Cardiology

## 2018-01-02 ENCOUNTER — Encounter (HOSPITAL_COMMUNITY): Payer: Self-pay

## 2018-01-02 ENCOUNTER — Encounter (HOSPITAL_COMMUNITY)
Admission: RE | Admit: 2018-01-02 | Discharge: 2018-01-02 | Disposition: A | Discharge status: Home / Self Care | Payer: Self-pay | Source: Ambulatory Visit | Attending: Cardiology | Admitting: Cardiology

## 2018-01-03 DIAGNOSIS — Z9842 Cataract extraction status, left eye: Secondary | ICD-10-CM | POA: Diagnosis not present

## 2018-01-03 DIAGNOSIS — Z9841 Cataract extraction status, right eye: Secondary | ICD-10-CM | POA: Diagnosis not present

## 2018-01-03 DIAGNOSIS — H0288A Meibomian gland dysfunction right eye, upper and lower eyelids: Secondary | ICD-10-CM | POA: Diagnosis not present

## 2018-01-03 DIAGNOSIS — H0288B Meibomian gland dysfunction left eye, upper and lower eyelids: Secondary | ICD-10-CM | POA: Diagnosis not present

## 2018-01-03 DIAGNOSIS — H5213 Myopia, bilateral: Secondary | ICD-10-CM | POA: Diagnosis not present

## 2018-01-03 DIAGNOSIS — H43821 Vitreomacular adhesion, right eye: Secondary | ICD-10-CM | POA: Diagnosis not present

## 2018-01-04 ENCOUNTER — Encounter (HOSPITAL_COMMUNITY): Payer: Self-pay

## 2018-01-04 ENCOUNTER — Encounter (HOSPITAL_COMMUNITY)
Admission: RE | Admit: 2018-01-04 | Discharge: 2018-01-04 | Disposition: A | Discharge status: Home / Self Care | Payer: Self-pay | Source: Ambulatory Visit | Attending: Cardiology | Admitting: Cardiology

## 2018-01-04 DIAGNOSIS — I2510 Atherosclerotic heart disease of native coronary artery without angina pectoris: Secondary | ICD-10-CM | POA: Diagnosis not present

## 2018-01-05 ENCOUNTER — Telehealth: Payer: Self-pay | Admitting: Cardiology

## 2018-01-05 NOTE — Telephone Encounter (Signed)
Patient returned call and gave an updated BP reading (155/87) [facility]  [facility] and took her BP when she returned home 145/80.  She will log Bps over the next few hours into tomorrow post medication.  She will call with updated values to further address.  Patient said that she is presently feeling great with no dizziness.Marland Kitchen

## 2018-01-05 NOTE — Telephone Encounter (Signed)
New message   Pt c/o BP issue: STAT if pt c/o blurred vision, one-sided weakness or slurred speech  1. What are your last 5 BP readings? 177/87, 171/84  2. Are you having any other symptoms (ex. Dizziness, headache, blurred vision, passed out)? Some dizziness  3. What is your BP issue? Patient thinks BP is too high

## 2018-01-05 NOTE — Telephone Encounter (Signed)
Spoke to patient who has concern about her BP.  She felt as if her BP was elevated this am with slight dizzyness and [facility].  It was 171/87 @ 10:00am after she took her medication at 8:00am.  She has no availability to check it at home because her cuff is too small.  She now takes Metoprolol Succinate 25 mg daily.  I recommended that she take another 25 mg, wait 2 hours and recheck her BP.  She will notify us of a BP at this time.

## 2018-01-05 NOTE — Telephone Encounter (Signed)
Agree.  Candee Furbish, MD

## 2018-01-06 ENCOUNTER — Encounter (HOSPITAL_COMMUNITY): Payer: Self-pay

## 2018-01-06 MED ORDER — AMLODIPINE BESYLATE 5 MG PO TABS: 5.0000 mg | ORAL_TABLET | Freq: Every day | ORAL | 3 refills | Status: DC

## 2018-01-06 NOTE — Telephone Encounter (Signed)
Pt is calling   Was told by Legrand Como to call this morning to speak to [facility] concerning her BP  162/86 last night 157/83  This morning

## 2018-01-06 NOTE — Telephone Encounter (Signed)
Pt taking Metoprolol Succinate 25 mg QD, Imdur 30 mg QD, Losartan 100 mg QD and Lasix 20 mg prn daily for edema.  Will have Dr Marlou Porch review.

## 2018-01-06 NOTE — Telephone Encounter (Signed)
Pt aware to start Amlodipine 5 mg a day.  Rx sent into pharmacy.  She will continue to monitor her BP and call back with any concerns.

## 2018-01-06 NOTE — Telephone Encounter (Signed)
Reviewed with Dr Marlou Porch who gives orders for pt to start Amlodipine 5 mg a day.

## 2018-01-10 ENCOUNTER — Ambulatory Visit (INDEPENDENT_AMBULATORY_CARE_PROVIDER_SITE_OTHER): Payer: PPO | Admitting: Family Medicine

## 2018-01-10 ENCOUNTER — Encounter: Payer: Self-pay | Admitting: Family Medicine

## 2018-01-10 VITALS — Temp 98.8°F | Ht 62.0 in | Wt 146.0 lb

## 2018-01-10 DIAGNOSIS — I10 Essential (primary) hypertension: Secondary | ICD-10-CM

## 2018-01-10 NOTE — Progress Notes (Signed)
Subjective:   Patient ID: Hannah Vasquez, female    DOB: 1951-07-03, 67 y.o.   MRN: [id number]  Hannah Vasquez is a pleasant 67 y.o. year old female who presents to clinic today with Follow-up (BP up and down/dizzy and headache/has reading)  on 01/10/2018  HPI:  H/o CAD- was at cardiac rehab last week (01/05/18)- while she was there, BP as elevated. She called Dr. Marlou Porch office who started her on amlodipine 5 mg daily.  She is also taking Toprol 25 mg daily, Imdur 30 mg daily, Losartan 100 mg daily and Lasix 20 mg daily as needed for swelling.  She has only taken it once since she had her MI.   She has been checking her BP at home, sometimes running in 024O systolic, other times as low as 973 systolic.  Has been intermittently dizzy but feels she was dizzy before amlodipine was started.         Current Outpatient Medications on File Prior to Visit  Medication Sig Dispense Refill  . albuterol (PROVENTIL HFA;VENTOLIN HFA) 108 (90 Base) MCG/ACT inhaler Inhale 1 puff into the lungs every 6 (six) hours as needed for wheezing or shortness of breath.    Marland Kitchen amLODipine (NORVASC) 5 MG tablet Take 1 tablet (5 mg total) by mouth daily. 90 tablet 3  . aspirin EC 81 MG tablet Take 81 mg by mouth every morning.     Marland Kitchen atorvastatin (LIPITOR) 80 MG tablet TAKE 1 TABLET BY MOUTH ONCE A DAY AT 6PM 30 tablet 12  . Cholecalciferol (VITAMIN D3) 1000 UNITS CAPS Take 1 capsule by mouth every morning.     . fluticasone (FLONASE) 50 MCG/ACT nasal spray Place into both nostrils daily.    . furosemide (LASIX) 20 MG tablet Take 1 tablet (20 mg total) by mouth daily as needed for edema. 90 tablet 3  . isosorbide mononitrate (IMDUR) 30 MG 24 hr tablet Take 1 tablet (30 mg total) by mouth daily. 90 tablet 3  . loratadine (CLARITIN) 10 MG tablet Take 1 tablet (10 mg total) by mouth every morning. 30 tablet 2  . losartan (COZAAR) 100 MG tablet Take 1 tablet (100 mg total) by mouth daily. 90 tablet 1  . metoprolol  succinate (TOPROL-XL) 25 MG 24 hr tablet Take 1 tablet (25 mg total) by mouth daily. 30 tablet 0  . Multiple Vitamin (MULTIVITAMIN) tablet Take 1 tablet by mouth every morning.     . nitroGLYCERIN (NITROSTAT) 0.4 MG SL tablet Place 1 tablet (0.4 mg total) under the tongue every 5 (five) minutes as needed for chest pain. 25 tablet 3  . Omega-3 Fatty Acids (FISH OIL) 1200 MG CAPS Take 1 capsule by mouth every morning.     Marland Kitchen omeprazole (PRILOSEC) 40 MG capsule Take 40 mg by mouth daily.     . potassium chloride (K-DUR) 10 MEQ tablet Take 10 mEq daily as needed by mouth. If taking Furosemide (Lasix)    . prasugrel (EFFIENT) 10 MG TABS tablet Take 10 mg by mouth daily.    . traMADol (ULTRAM) 50 MG tablet Take 1 tablet (50 mg total) by mouth every 8 (eight) hours as needed (pain). 30 tablet 0   No current facility-administered medications on file prior to visit.     Allergies  Allergen Reactions  . Bactroban [Mupirocin Calcium]     Facial redness and swelling  . Codeine     REACTION: nausea and vomiting  . Penicillins Nausea And Vomiting  REACTION: rash    Past Medical History:  Diagnosis Date  . Allergy   . Anxiety   . CAD (coronary artery disease)   . Cataract   . Hemorrhoids   . Hypertension   . Mitral valve prolapse   . Osteoporosis   . Status post dilation of esophageal narrowing     Past Surgical History:  Procedure Laterality Date  . BASAL CELL CARCINOMA EXCISION  02/05/2016   Dr. Sarajane Jews, [facility]  . BLADDER SURGERY  11/15/1995   bladder neck suspension, urinary incontinence  . CARDIAC CATHETERIZATION  01/03/2017 DES LAD  . CATARACT EXTRACTION Left 04/30/2016   Dr. Tommy Rainwater, [place] N/A 01/03/2017   Procedure: Coronary Stent Intervention;  Surgeon: Nelva Bush, MD;  Location: [facility];  Service: Cardiovascular;  Laterality: N/A;  . DENTAL SURGERY  06/20/2016   Tooth Implant; Dr. Kalman Shan  . INTRAVASCULAR  ULTRASOUND/IVUS N/A 01/03/2017   Procedure: Intravascular Ultrasound/IVUS;  Surgeon: Nelva Bush, MD;  Location: [facility];  Service: Cardiovascular;  Laterality: N/A;  . KNEE SURGERY  2003   torn meniscus  . LEFT HEART CATH AND CORONARY ANGIOGRAPHY N/A 01/03/2017   Procedure: Left Heart Cath and Coronary Angiography;  Surgeon: Nelva Bush, MD;  Location: [facility];  Service: Cardiovascular;  Laterality: N/A;  . MOHS SURGERY Right 2017   right side of face  . ROTATOR CUFF REPAIR    . SHOULDER SURGERY Right 2005    Family History  Problem Relation Age of Onset  . Mitral valve prolapse Mother   . Hypertension Father   . Brain cancer Father     Social History   Socioeconomic History  . Marital status: Divorced    Spouse name: Not on file  . Number of children: 2  . Years of education: Not on file  . Highest education level: Not on file  Occupational History  . 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 Financial resource strain: Not on file  . Food insecurity:    Worry: Not on file    Inability: Not on file  . Transportation needs:    Medical: Not on file    Non-medical: Not on file  Tobacco Use  . Smoking status: Never Smoker  . Smokeless tobacco: Never Used  Substance and Sexual Activity  . Alcohol use: No    Alcohol/week: 0.0 oz  . Drug use: No  . Sexual activity: Yes  Lifestyle  . Physical activity:    Days per week: Not on file    Minutes per session: Not on file  . Stress: Not on file  Relationships  . Social connections:    Talks on phone: Not on file    Gets together: Not on file    Attends religious service: Not on file    Active member of club or organization: Not on file    Attends meetings of clubs or organizations: Not on file    Relationship status: Not on file  . Intimate partner violence:    Fear of current or ex partner: Not on file    Emotionally abused: Not on file    Physically  abused: Not on file    Forced sexual activity: Not on file  Other Topics Concern  . Not on file  Social History Narrative   Has a living will.   HPOA- daughter.   She would desire CPR.  Would desire  life support if necessary and not futile.         The PMH, PSH, Social History, Family History, Medications, and allergies have been reviewed in [facility], and have been updated if relevant.  Review of Systems  Constitutional: Negative.   Eyes: Negative.   Respiratory: Negative.   Cardiovascular: Negative.   Musculoskeletal: Negative.   Neurological: Positive for dizziness. Negative for tremors, seizures, syncope, facial asymmetry, speech difficulty, weakness, light-headedness, numbness and headaches.  Hematological: Negative.   Psychiatric/Behavioral: Negative.   All other systems reviewed and are negative.      Objective:    BP 118/66   Pulse 77   Temp 98.8 F (37.1 C) (Oral)   Ht 5' 2"  (1.575 m)   Wt 146 lb (66.2 kg)   LMP  (LMP Unknown)   SpO2 97%   BMI 26.70 kg/m    Physical Exam  Constitutional: She is oriented to person, place, and time. She appears well-developed and well-nourished. No distress.  HENT:  Head: Normocephalic and atraumatic.  Eyes: EOM are normal.  Cardiovascular: Normal rate and regular rhythm.  Pulmonary/Chest: Effort normal.  Musculoskeletal: Normal range of motion. She exhibits no edema.  Neurological: She is alert and oriented to person, place, and time. No cranial nerve deficit.  Skin: Skin is warm and dry. She is not diaphoretic.  Psychiatric: She has a normal mood and affect. Her behavior is normal. Judgment and thought content normal.  Nursing note and vitals reviewed.         Assessment & Plan:   HYPERTENSION, BENIGN No follow-ups on file.

## 2018-01-10 NOTE — Patient Instructions (Signed)
Great to see you. Let's continue your current medication. Please bring your Blood pressure cuff to cardiac rehab tomorrow. Keep me updated.

## 2018-01-10 NOTE — Assessment & Plan Note (Signed)
Normotensive and orthostatics negative today.  Has only been one week since she started amlodipine. Cautioned her that she may still become hypotensive.  I asked her to bring her BP cuff to cardiac rehab tomorrow.  No changes made today. The patient indicates understanding of these issues and agrees with the plan.

## 2018-01-11 ENCOUNTER — Encounter (HOSPITAL_COMMUNITY): Payer: Self-pay

## 2018-01-13 ENCOUNTER — Encounter (HOSPITAL_COMMUNITY): Payer: Self-pay

## 2018-01-13 ENCOUNTER — Encounter (HOSPITAL_COMMUNITY)
Admission: RE | Admit: 2018-01-13 | Discharge: 2018-01-13 | Disposition: A | Discharge status: Home / Self Care | Payer: PPO | Source: Ambulatory Visit | Attending: Cardiology | Admitting: Cardiology

## 2018-01-16 ENCOUNTER — Encounter (HOSPITAL_COMMUNITY): Payer: PPO

## 2018-01-16 DIAGNOSIS — R5383 Other fatigue: Secondary | ICD-10-CM | POA: Insufficient documentation

## 2018-01-16 DIAGNOSIS — I2583 Coronary atherosclerosis due to lipid rich plaque: Secondary | ICD-10-CM | POA: Diagnosis not present

## 2018-01-16 DIAGNOSIS — I2510 Atherosclerotic heart disease of native coronary artery without angina pectoris: Secondary | ICD-10-CM | POA: Insufficient documentation

## 2018-01-16 DIAGNOSIS — I10 Essential (primary) hypertension: Secondary | ICD-10-CM | POA: Diagnosis not present

## 2018-01-16 DIAGNOSIS — R0609 Other forms of dyspnea: Secondary | ICD-10-CM | POA: Insufficient documentation

## 2018-01-18 ENCOUNTER — Other Ambulatory Visit: Payer: Self-pay | Admitting: Family Medicine

## 2018-01-18 ENCOUNTER — Encounter (HOSPITAL_COMMUNITY): Payer: PPO

## 2018-01-18 MED ORDER — TRAMADOL HCL 50 MG PO TABS: 50.0000 mg | ORAL_TABLET | Freq: Three times a day (TID) | ORAL | 0 refills | Status: DC | PRN

## 2018-01-18 NOTE — Telephone Encounter (Signed)
Copied from [place] [phone]. Topic: Quick Communication - Rx Refill/Question >> Jan 18, 2018  8:15 AM Nils Flack wrote: Medication: tramadol   Has the patient contacted their pharmacy? Yes.   (Agent: If no, request that the patient contact the pharmacy for the refill.) (Agent: If yes, when and what did the pharmacy advise?) provider calls in   Preferred Pharmacy (with phone number or street name): [facility]  Agent: Please be advised that RX refills may take up to 3 business days. We ask that you follow-up with your pharmacy.

## 2018-01-18 NOTE — Telephone Encounter (Signed)
Dr. Deborra Medina please advise.

## 2018-01-18 NOTE — Telephone Encounter (Signed)
Tramadol refill Last Refill:12/20/17 #30 Last OV: 01/10/18 PCP: Dr. Deborra Medina Pharmacy: [facility]

## 2018-01-19 ENCOUNTER — Other Ambulatory Visit: Payer: Self-pay | Admitting: Nurse Practitioner

## 2018-01-19 MED ORDER — TRAMADOL HCL 50 MG PO TABS: 50.0000 mg | ORAL_TABLET | Freq: Three times a day (TID) | ORAL | 0 refills | Status: DC | PRN

## 2018-01-19 NOTE — Telephone Encounter (Signed)
I am thinking she changed her care to Dr Deborra Medina. She saw Dr Deborra Medina for BP and has canceled her next appt with Dr Silvio Pate.

## 2018-01-19 NOTE — Telephone Encounter (Signed)
Had to reprint this rx, sign and faxed to [facility]

## 2018-01-19 NOTE — Telephone Encounter (Signed)
SB-This pt? Has been seeing Dr. Silvio Pate since December/I'm confused if he should be as the PCP or what? Plz advise/thx dmf

## 2018-01-19 NOTE — Telephone Encounter (Signed)
This has been faxed in/thx dmf

## 2018-01-19 NOTE — Addendum Note (Signed)
Addended byShawnie Pons on: 01/19/2018 08:53 AM   Modules accepted: Orders

## 2018-01-20 ENCOUNTER — Encounter (HOSPITAL_COMMUNITY): Payer: PPO

## 2018-01-23 ENCOUNTER — Encounter (HOSPITAL_COMMUNITY): Payer: PPO

## 2018-01-23 ENCOUNTER — Encounter (HOSPITAL_COMMUNITY)
Admission: RE | Admit: 2018-01-23 | Discharge: 2018-01-23 | Disposition: A | Discharge status: Home / Self Care | Payer: PPO | Source: Ambulatory Visit | Attending: Cardiology | Admitting: Cardiology

## 2018-01-24 ENCOUNTER — Ambulatory Visit: Payer: PPO | Admitting: Cardiology

## 2018-01-24 ENCOUNTER — Encounter: Payer: Self-pay | Admitting: Cardiology

## 2018-01-24 VITALS — BP 126/64 | HR 77 | Ht 62.0 in | Wt 146.0 lb

## 2018-01-24 DIAGNOSIS — I2510 Atherosclerotic heart disease of native coronary artery without angina pectoris: Secondary | ICD-10-CM

## 2018-01-24 DIAGNOSIS — R0602 Shortness of breath: Secondary | ICD-10-CM | POA: Diagnosis not present

## 2018-01-24 DIAGNOSIS — R5383 Other fatigue: Secondary | ICD-10-CM

## 2018-01-24 NOTE — Patient Instructions (Signed)
Medication Instructions:  Please discontinue your Effient. Continue all other medications as listed.  Follow-Up: Follow up in 6 months with Truitt Merle, NP.  You will receive a letter in the mail 2 months before you are due.  Please call us when you receive this letter to schedule your follow up appointment.  Follow up in 1 year with Dr. Marlou Porch.  You will receive a letter in the mail 2 months before you are due.  Please call us when you receive this letter to schedule your follow up appointment.  If you need a refill on your cardiac medications before your next appointment, please call your pharmacy.  Thank you for choosing [facility]!!

## 2018-01-24 NOTE — Progress Notes (Signed)
[place]. [address] [place], [place]  [address] Phone: [phone] Fax:  [phone]   Date:  01/24/2018   ID:  Hannah Vasquez, Hannah Vasquez August 14, 1951, MRN [id number]  PCP:  Lucille Passy, MD   History of Present Illness: Hannah Vasquez is a 67 y.o. female here for clinic follow up with history of ST elevation myocardial infarction on 01/01/17 stating that she had chest pressure that waxed and waned then returned, troponin was 0.16. She went to the Cath Lab and received 2 stents to the LAD and showed an EF of 40-45%.Residual small ramus dz and jailed diag.   Previous visits: 9/13 - shingles then PNA. Said she had shingles all over?. Started on leg. A doctor at the hospital when she was visiting her father turned to her and asked her what was wrong. He then got a second opinion and told her that she had shingles. 5/14 - back pain, injections, PT 3/15 - feeling so out of breath, walking from here to door. Fatigue.  Previously out of breath. No chest pain. At times feels chest tightness. When SOB happens, sometimes just talking at rest, needs to stop and take a deep breath. Felt winded.   08/24/17 -Was hospitalized and recently discharged on 08/22/17 for atypical chest pain, fatigue, SOB.  Dr. Harl Bowie saw her in consultation, note reviewed.  She was having ongoing fatigue since her stents were placed in May 2018.  Low energy.  Short of breath at times.  Change off of Brilinta to Plavix did not help.  She was having increased nausea.  Intermittent left side mid rib cage 5/10 dull pain.  Rest or exertion.  Non-positional.  Daily.  Troponins were negative.  X-ray was negative.  EKG with nonspecific ST-T wave changes.  Beta-blocker was reduced by cardiology team.  There was question of possible obstructive sleep apnea may wish to have a sleep study.  Her Valtrex was DC'd which can cause fatigue.  She just feels similar shortness of breath.  Interestingly, back in 2014 she had a CT angiogram of chest  because of shortness of breath as well.  Thankfully, her ejection fraction is now normalized after echocardiogram repeat in July 2018.  No syncope bleeding orthopnea PND.  She desires to go back to cardiac rehab.  01/24/2018-enjoying cardiopulmonary rehab.  She had a cardiopulmonary exercise stress test on 12/15/2017-increased blood pressure during exercise.  Decreased exercise tolerance.  Overall increase losartan to 100.  Feels well otherwise.  Does have decreased energy, some foot pain.  Pulses are excellent.  Wt Readings from Last 3 Encounters:  01/24/18 146 lb (66.2 kg)  01/10/18 146 lb (66.2 kg)  12/05/17 145 lb 6.4 oz (66 kg)     Past Medical History:  Diagnosis Date  . Allergy   . Anxiety   . CAD (coronary artery disease)   . Cataract   . Hemorrhoids   . Hypertension   . Mitral valve prolapse   . Osteoporosis   . Status post dilation of esophageal narrowing     Past Surgical History:  Procedure Laterality Date  . BASAL CELL CARCINOMA EXCISION  02/05/2016   Dr. Sarajane Jews, [facility]  . BLADDER SURGERY  11/15/1995   bladder neck suspension, urinary incontinence  . CARDIAC CATHETERIZATION  01/03/2017 DES LAD  . CATARACT EXTRACTION Left 04/30/2016   Dr. Tommy Rainwater, [place] N/A 01/03/2017   Procedure: Coronary Stent Intervention;  Surgeon: Nelva Bush,  MD;  Location: [facility] [facility];  Service: Cardiovascular;  Laterality: N/A;  . DENTAL SURGERY  06/20/2016   Tooth Implant; Dr. Kalman Shan  . INTRAVASCULAR ULTRASOUND/IVUS N/A 01/03/2017   Procedure: Intravascular Ultrasound/IVUS;  Surgeon: Nelva Bush, MD;  Location: [place] [facility];  Service: Cardiovascular;  Laterality: N/A;  . KNEE SURGERY  2003   torn meniscus  . LEFT HEART CATH AND CORONARY ANGIOGRAPHY N/A 01/03/2017   Procedure: Left Heart Cath and Coronary Angiography;  Surgeon: Nelva Bush, MD;  Location: [place] [facility];  Service: Cardiovascular;  Laterality:  N/A;  . MOHS SURGERY Right 2017   right side of face  . ROTATOR CUFF REPAIR    . SHOULDER SURGERY Right 2005    Current Outpatient Medications  Medication Sig Dispense Refill  . albuterol (PROVENTIL HFA;VENTOLIN HFA) 108 (90 Base) MCG/ACT inhaler Inhale 1 puff into the lungs every 6 (six) hours as needed for wheezing or shortness of breath.    Marland Kitchen amLODipine (NORVASC) 5 MG tablet Take 1 tablet (5 mg total) by mouth daily. 90 tablet 3  . aspirin EC 81 MG tablet Take 81 mg by mouth every morning.     Marland Kitchen atorvastatin (LIPITOR) 80 MG tablet TAKE 1 TABLET BY MOUTH ONCE A DAY AT 6PM 30 tablet 12  . Cholecalciferol (VITAMIN D3) 1000 UNITS CAPS Take 1 capsule by mouth every morning.     . fluticasone (FLONASE) 50 MCG/ACT nasal spray Place into both nostrils daily.    . furosemide (LASIX) 20 MG tablet Take 1 tablet (20 mg total) by mouth daily as needed for edema. 90 tablet 3  . isosorbide mononitrate (IMDUR) 30 MG 24 hr tablet TAKE 1 TABLET BY MOUTH ONCE A DAY 90 tablet 3  . loratadine (CLARITIN) 10 MG tablet Take 1 tablet (10 mg total) by mouth every morning. 30 tablet 2  . losartan (COZAAR) 100 MG tablet Take 1 tablet (100 mg total) by mouth daily. 90 tablet 1  . metoprolol succinate (TOPROL-XL) 25 MG 24 hr tablet Take 1 tablet (25 mg total) by mouth daily. 30 tablet 0  . Multiple Vitamin (MULTIVITAMIN) tablet Take 1 tablet by mouth every morning.     . nitroGLYCERIN (NITROSTAT) 0.4 MG SL tablet Place 1 tablet (0.4 mg total) under the tongue every 5 (five) minutes as needed for chest pain. 25 tablet 3  . Omega-3 Fatty Acids (FISH OIL) 1200 MG CAPS Take 1 capsule by mouth every morning.     Marland Kitchen omeprazole (PRILOSEC) 40 MG capsule Take 40 mg by mouth daily.     . potassium chloride (K-DUR) 10 MEQ tablet Take 10 mEq daily as needed by mouth. If taking Furosemide (Lasix)    . traMADol (ULTRAM) 50 MG tablet Take 1 tablet (50 mg total) by mouth every 8 (eight) hours as needed (pain). 30 tablet 0   No  current facility-administered medications for this visit.     Allergies:    Allergies  Allergen Reactions  . Bactroban [Mupirocin Calcium]     Facial redness and swelling  . Codeine     REACTION: nausea and vomiting  . Penicillins Nausea And Vomiting    REACTION: rash    Social History:  The patient  reports that she has never smoked. She has never used smokeless tobacco. She reports that she does not drink alcohol or use drugs.  Retired Family History  Problem Relation Age of Onset  . Mitral valve prolapse Mother   . Hypertension Father   .  Brain cancer Father     ROS:  Please see the history of present illness.  Occasional back pain thoracic pain, takes Tylenol, no syncope, bleeding.  Positive bruising.    All other systems reviewed and negative.   PHYSICAL EXAM: VS:  BP 126/64   Pulse 77   Ht 5' 2"  (1.575 m)   Wt 146 lb (66.2 kg)   LMP  (LMP Unknown)   SpO2 99%   BMI 26.70 kg/m  GEN: Well nourished, well developed, in no acute distress  HEENT: normal  Neck: no JVD, carotid bruits, or masses Cardiac: RRR; no murmurs, rubs, or gallops,no edema  Respiratory:  clear to auscultation bilaterally, normal work of breathing GI: soft, nontender, nondistended, + BS MS: no deformity or atrophy, excellent distal pulses. Skin: warm and dry, no rash Neuro:  Alert and Oriented x 3, Strength and sensation are intact Psych: euthymic mood, full affect   EKG:-prior 04/15/16-sinus bradycardia, 50 right bundle branch block personally viewed-no significant change from prior SR RBBB.   Chronic  LABS: LDL 124, creatinine 0.9, hemoglobin 14.1, vitamin B12 232, TSH 2.23  Cath Cath 01/03/17 Conclusions: 1. Significant 2-vessel coronary artery disease, including diffuse LAD disease with up to 99% stenosis in the mid portion and TIMI-2 flow, as well as 90% small ramus lesion and 40% proximal/mid LCx disease. 2. Moderately reduced LV contraction (LVEF 35-45%) with anterior and apical  akinesis. 3. Moderately elevated left ventricular filling pressure. 4. Successful IVUS-guided PCI to the mid and distal LAD with placement of 2 overlapping Resolute Onyx drug-eluting stents with 0% residual stenosis and TIMI-3 flow.  Recommendations: 1. Dual antiplatelet therapy with aspirin and ticagrelor for at least 12 months, ideally longer. 2. Aggressive secondary prevention. 3. Medical therapy for small ramus intermedius and jailed diagonal branch, as PCI would be challenging given small vessel size. 4. Diuresis and titration of evidence-based heart failure therapy, given moderately reduced LV contraction with elevated filling pressure.   Echocardiogram 01/03/17: -EF 45%  ECHO 02/2017  - EF 65%  Cardiopulmonary stress test 12/15/2017 -Reduced exercise tolerance.  Increased blood pressure response to exercise.  Losartan increased.  ASSESSMENT AND PLAN:   Non-ST elevation myocardial infarction 01/03/17  - LAD stents 2, residual ramus disease, jailed diagonal.  - Switched from Brilinta to Effient originally to see if this would help with shortness of breath but it did not.  Thankfully, we can stop her Effient at this point.  Is been greater than 1 year post stents placement.   -Thankfully, ejection fraction has improved from 45-65.  Hyperlipidemia  - Continue with high intensity statin therapy. LDL 44, no myalgias.  Shortness of breath  -Mild improvement  -PFTs -showed findings related to reduced DLCO, pulmonary vascular process.  She saw Dr. Lamonte Sakai with pulmonary.  Etiology not quite clear.  Continuing pulmonary rehab.  Started using fluticasone.  Thankfully, cardiopulmonary stress test just showed decreased exercise tolerance.  -EF has normalized.  No significant mitral valve prolapse was seen on prior echo.  She does not need dental prophylaxis.  We will have her come back in 6 months to see Remer Macho, NP, 1 year with me.  Signed, Candee Furbish, MD [facility]  01/24/2018  9:35 AM

## 2018-01-25 ENCOUNTER — Encounter (HOSPITAL_COMMUNITY)
Admission: RE | Admit: 2018-01-25 | Discharge: 2018-01-25 | Disposition: A | Discharge status: Home / Self Care | Payer: PPO | Source: Ambulatory Visit | Attending: Cardiology | Admitting: Cardiology

## 2018-01-25 ENCOUNTER — Encounter (HOSPITAL_COMMUNITY): Payer: PPO

## 2018-01-27 ENCOUNTER — Encounter (HOSPITAL_COMMUNITY)
Admission: RE | Admit: 2018-01-27 | Discharge: 2018-01-27 | Disposition: A | Discharge status: Home / Self Care | Payer: PPO | Source: Ambulatory Visit | Attending: Cardiology | Admitting: Cardiology

## 2018-01-27 ENCOUNTER — Encounter (HOSPITAL_COMMUNITY): Payer: PPO

## 2018-01-30 ENCOUNTER — Encounter (HOSPITAL_COMMUNITY): Payer: PPO

## 2018-01-31 ENCOUNTER — Ambulatory Visit (INDEPENDENT_AMBULATORY_CARE_PROVIDER_SITE_OTHER): Payer: PPO | Admitting: Family Medicine

## 2018-01-31 ENCOUNTER — Encounter: Payer: Self-pay | Admitting: Family Medicine

## 2018-01-31 VITALS — BP 138/70 | HR 63 | Temp 98.4°F | Ht 62.0 in | Wt 147.2 lb

## 2018-01-31 DIAGNOSIS — R5383 Other fatigue: Secondary | ICD-10-CM | POA: Diagnosis not present

## 2018-01-31 DIAGNOSIS — I2510 Atherosclerotic heart disease of native coronary artery without angina pectoris: Secondary | ICD-10-CM

## 2018-01-31 DIAGNOSIS — I2583 Coronary atherosclerosis due to lipid rich plaque: Secondary | ICD-10-CM | POA: Diagnosis not present

## 2018-01-31 LAB — VITAMIN D 25 HYDROXY (VIT D DEFICIENCY, FRACTURES): VITD: 42.13 ng/mL (ref 30.00–100.00)

## 2018-01-31 LAB — COMPREHENSIVE METABOLIC PANEL
ALBUMIN: 4.2 g/dL (ref 3.5–5.2)
ALT: 14 U/L (ref 0–35)
AST: 16 U/L (ref 0–37)
Alkaline Phosphatase: 119 U/L — ABNORMAL HIGH (ref 39–117)
BUN: 9 mg/dL (ref 6–23)
CO2: 28 mEq/L (ref 19–32)
Calcium: 9.6 mg/dL (ref 8.4–10.5)
Chloride: 107 mEq/L (ref 96–112)
Creatinine, Ser: 0.87 mg/dL (ref 0.40–1.20)
GFR: 68.55 mL/min (ref >60.00)
GLUCOSE: 110 mg/dL — AB (ref 70–99)
POTASSIUM: 4.1 meq/L (ref 3.5–5.1)
Sodium: 144 mEq/L (ref 135–145)
TOTAL PROTEIN: 7.1 g/dL (ref 6.0–8.3)
Total Bilirubin: 0.5 mg/dL (ref 0.2–1.2)

## 2018-01-31 LAB — TSH: TSH: 1.61 u[IU]/mL (ref 0.35–4.50)

## 2018-01-31 LAB — CBC
HEMATOCRIT: 37.4 % (ref 36.0–46.0)
Hemoglobin: 12.6 g/dL (ref 12.0–15.0)
MCHC: 33.5 g/dL (ref 30.0–36.0)
MCV: 81.3 fl (ref 78.0–100.0)
Platelets: 311 10*3/uL (ref 150.0–400.0)
RBC: 4.6 Mil/uL (ref 3.87–5.11)
RDW: 14.6 % (ref 11.5–15.5)
WBC: 8.5 10*3/uL (ref 4.0–10.5)

## 2018-01-31 LAB — TROPONIN I: TNIDX: 0 ug/L (ref 0.00–0.06)

## 2018-01-31 LAB — FERRITIN: FERRITIN: 38.8 ng/mL (ref 10.0–291.0)

## 2018-01-31 LAB — VITAMIN B12: Vitamin B-12: 345 pg/mL (ref 211–911)

## 2018-01-31 NOTE — Progress Notes (Signed)
Subjective:   Patient ID: Hannah Vasquez, female    DOB: 08-22-1950, 67 y.o.   MRN: [id number]  Hannah Vasquez is a pleasant 67 y.o. year old female who presents to clinic today with Fatigue (Patient is here today C/O intermittent fatigue.  States it has been going on for several months.  The last couple of days she feels that she has been dragging.  Wanted to find out which direction she should go in.  Bones feel like they ache.) and Foot Pain (She is also C/O bilateral foot pain.  This has been an intermittent issue for years.  The way she explains it is like neuropathy.  Was on Lyrica in the past.  Has been on Tramadol for this but doesn't feel that it helps as much with the foot problems but is very helpful for the bone pains that she suffers from.)  on 01/31/2018  HPI:  Patient is here today C/O intermittent fatigue. States it has been going on for several months. The last couple of days she feels that she has been dragging. Wanted to find out which direction she should go in. Bones feel like they ache.  She is also C/O bilateral foot pain. This has been an intermittent issue for years. The way she explains it is like neuropathy. Was on Lyrica in the past. Has been on Tramadol for this but doesn't feel that it helps as much with the foot problems but is very helpful for the bone pains that she suffers from.   Current Outpatient Medications on File Prior to Visit  Medication Sig Dispense Refill  . albuterol (PROVENTIL HFA;VENTOLIN HFA) 108 (90 Base) MCG/ACT inhaler Inhale 1 puff into the lungs every 6 (six) hours as needed for wheezing or shortness of breath.    Marland Kitchen amLODipine (NORVASC) 5 MG tablet Take 1 tablet (5 mg total) by mouth daily. 90 tablet 3  . aspirin EC 81 MG tablet Take 81 mg by mouth every morning.     Marland Kitchen atorvastatin (LIPITOR) 80 MG tablet TAKE 1 TABLET BY MOUTH ONCE A DAY AT 6PM 30 tablet 12  . Cholecalciferol (VITAMIN D3) 1000 UNITS CAPS Take 1 capsule by mouth every morning.       . fluticasone (FLONASE) 50 MCG/ACT nasal spray Place into both nostrils daily.    . furosemide (LASIX) 20 MG tablet Take 1 tablet (20 mg total) by mouth daily as needed for edema. 90 tablet 3  . isosorbide mononitrate (IMDUR) 30 MG 24 hr tablet TAKE 1 TABLET BY MOUTH ONCE A DAY 90 tablet 3  . loratadine (CLARITIN) 10 MG tablet Take 1 tablet (10 mg total) by mouth every morning. 30 tablet 2  . losartan (COZAAR) 100 MG tablet Take 1 tablet (100 mg total) by mouth daily. 90 tablet 1  . metoprolol succinate (TOPROL-XL) 25 MG 24 hr tablet Take 1 tablet (25 mg total) by mouth daily. 30 tablet 0  . Multiple Vitamin (MULTIVITAMIN) tablet Take 1 tablet by mouth every morning.     . nitroGLYCERIN (NITROSTAT) 0.4 MG SL tablet Place 1 tablet (0.4 mg total) under the tongue every 5 (five) minutes as needed for chest pain. 25 tablet 3  . Omega-3 Fatty Acids (FISH OIL) 1200 MG CAPS Take 1 capsule by mouth every morning.     Marland Kitchen omeprazole (PRILOSEC) 40 MG capsule Take 40 mg by mouth daily.     . potassium chloride (K-DUR) 10 MEQ tablet Take 10 mEq daily as needed by  mouth. If taking Furosemide (Lasix)    . traMADol (ULTRAM) 50 MG tablet Take 1 tablet (50 mg total) by mouth every 8 (eight) hours as needed (pain). 30 tablet 0   No current facility-administered medications on file prior to visit.     Allergies  Allergen Reactions  . Bactroban [Mupirocin Calcium]     Facial redness and swelling  . Codeine     REACTION: nausea and vomiting  . Penicillins Nausea And Vomiting    REACTION: rash    Past Medical History:  Diagnosis Date  . Allergy   . Anxiety   . CAD (coronary artery disease)   . Cataract   . Hemorrhoids   . Hypertension   . Mitral valve prolapse   . Osteoporosis   . Status post dilation of esophageal narrowing     Past Surgical History:  Procedure Laterality Date  . BASAL CELL CARCINOMA EXCISION  02/05/2016   Dr. Sarajane Jews, [facility]  . BLADDER SURGERY  11/15/1995    bladder neck suspension, urinary incontinence  . CARDIAC CATHETERIZATION  01/03/2017 DES LAD  . CATARACT EXTRACTION Left 04/30/2016   Dr. Tommy Rainwater, [place] N/A 01/03/2017   Procedure: Coronary Stent Intervention;  Surgeon: Nelva Bush, MD;  Location: [place] [facility];  Service: Cardiovascular;  Laterality: N/A;  . DENTAL SURGERY  06/20/2016   Tooth Implant; Dr. Kalman Shan  . INTRAVASCULAR ULTRASOUND/IVUS N/A 01/03/2017   Procedure: Intravascular Ultrasound/IVUS;  Surgeon: Nelva Bush, MD;  Location: [place] [facility];  Service: Cardiovascular;  Laterality: N/A;  . KNEE SURGERY  2003   torn meniscus  . LEFT HEART CATH AND CORONARY ANGIOGRAPHY N/A 01/03/2017   Procedure: Left Heart Cath and Coronary Angiography;  Surgeon: Nelva Bush, MD;  Location: [place] [facility];  Service: Cardiovascular;  Laterality: N/A;  . MOHS SURGERY Right 2017   right side of face  . ROTATOR CUFF REPAIR    . SHOULDER SURGERY Right 2005    Family History  Problem Relation Age of Onset  . Mitral valve prolapse Mother   . Hypertension Father   . Brain cancer Father     Social History   Socioeconomic History  . Marital status: Divorced    Spouse name: Not on file  . Number of children: 2  . Years of education: Not on file  . Highest education level: Not on file  Occupational History  . 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 Financial resource strain: Not on file  . Food insecurity:    Worry: Not on file    Inability: Not on file  . Transportation needs:    Medical: Not on file    Non-medical: Not on file  Tobacco Use  . Smoking status: Never Smoker  . Smokeless tobacco: Never Used  Substance and Sexual Activity  . Alcohol use: No    Alcohol/week: 0.0 oz  . Drug use: No  . Sexual activity: Yes  Lifestyle  . Physical activity:    Days per week: Not on file    Minutes per session: Not on file  . Stress: Not  on file  Relationships  . Social connections:    Talks on phone: Not on file    Gets together: Not on file    Attends religious service: Not on file    Active member of club or organization: Not on file    Attends meetings of clubs or organizations: Not  on file    Relationship status: Not on file  . Intimate partner violence:    Fear of current or ex partner: Not on file    Emotionally abused: Not on file    Physically abused: Not on file    Forced sexual activity: Not on file  Other Topics Concern  . Not on file  Social History Narrative   Has a living will.   HPOA- daughter.   She would desire CPR.  Would desire life support if necessary and not futile.         The PMH, PSH, Social History, Family History, Medications, and allergies have been reviewed in [facility], and have been updated if relevant.   Review of Systems  Constitutional: Positive for fatigue.  HENT: Negative.   Eyes: Negative.   Respiratory: Negative.   Cardiovascular: Negative.   Gastrointestinal: Negative.   Endocrine: Negative.   Genitourinary: Negative.   Musculoskeletal: Positive for arthralgias.  Allergic/Immunologic: Negative.   Neurological: Positive for numbness.  Hematological: Negative.   Psychiatric/Behavioral: Negative.   All other systems reviewed and are negative.      Objective:    BP 138/70 (BP Location: Left Arm, Patient Position: Sitting, Cuff Size: Normal)   Pulse 63   Temp 98.4 F (36.9 C) (Oral)   Ht 5' 2"  (1.575 m)   Wt 147 lb 3.2 oz (66.8 kg)   LMP  (LMP Unknown)   SpO2 97%   BMI 26.92 kg/m    Physical Exam    General:  Well-developed,well-nourished,in no acute distress; alert,appropriate and cooperative throughout examination Head:  normocephalic and atraumatic.   Eyes:  vision grossly intact, PERRL Ears:  R ear normal and L ear normal externally, TMs clear bilaterally Nose:  no external deformity.   Mouth:  good dentition.   Neck:  No deformities, masses, or  tenderness noted. Lungs:  Normal respiratory effort, chest expands symmetrically. Lungs are clear to auscultation, no crackles or wheezes. Heart:  Normal rate and regular rhythm. S1 and S2 normal without gallop, murmur, click, rub or other extra sounds.ed Msk:  No deformity or scoliosis noted of thoracic or lumbar spine.   Extremities:  No clubbing, cyanosis, edema, or deformity noted with normal full range of motion of all joints.   Neurologic:  alert & oriented X3 and gait normal.   Skin:  Intact without suspicious lesions or rashes Psych:  Cognition and judgment appear intact. Alert and cooperative with normal attention span and concentration. No apparent delusions, illusions, hallucinations      Assessment & Plan:   Fatigue, unspecified type - Plan: B12, Vitamin D (25 hydroxy), CBC, Ferritin, Comprehensive metabolic panel, TSH, EKG 36-IWOE  Coronary atherosclerosis due to lipid rich plaque - Plan: Troponin I, EKG 12-Lead No follow-ups on file.

## 2018-01-31 NOTE — Assessment & Plan Note (Signed)
Likely multifactorial.  Given h/o CAD/MI, will check troponin amongst other labs for fatigue. EKG was reassuring today- unchanged from prior. The patient indicates understanding of these issues and agrees with the plan. Orders Placed This Encounter  Procedures  . B12  . Vitamin D (25 hydroxy)  . CBC  . Ferritin  . Comprehensive metabolic panel  . TSH  . Troponin I  . EKG 12-Lead

## 2018-01-31 NOTE — Patient Instructions (Signed)
Great to see you. I will call you with your lab results from today and you can view them online.   

## 2018-02-01 ENCOUNTER — Encounter (HOSPITAL_COMMUNITY): Payer: PPO

## 2018-02-01 ENCOUNTER — Encounter (HOSPITAL_COMMUNITY)
Admission: RE | Admit: 2018-02-01 | Discharge: 2018-02-01 | Disposition: A | Discharge status: Home / Self Care | Payer: PPO | Source: Ambulatory Visit | Attending: Cardiology | Admitting: Cardiology

## 2018-02-03 ENCOUNTER — Encounter (HOSPITAL_COMMUNITY): Payer: PPO

## 2018-02-03 ENCOUNTER — Encounter (HOSPITAL_COMMUNITY)
Admission: RE | Admit: 2018-02-03 | Discharge: 2018-02-03 | Disposition: A | Discharge status: Home / Self Care | Payer: PPO | Source: Ambulatory Visit | Attending: Cardiology | Admitting: Cardiology

## 2018-02-06 ENCOUNTER — Encounter (HOSPITAL_COMMUNITY)
Admission: RE | Admit: 2018-02-06 | Discharge: 2018-02-06 | Disposition: A | Discharge status: Home / Self Care | Payer: PPO | Source: Ambulatory Visit | Attending: Cardiology | Admitting: Cardiology

## 2018-02-06 ENCOUNTER — Encounter (HOSPITAL_COMMUNITY): Payer: PPO

## 2018-02-07 ENCOUNTER — Other Ambulatory Visit: Payer: Self-pay | Admitting: Family Medicine

## 2018-02-07 NOTE — Telephone Encounter (Signed)
Copied from [place] [id number]. Topic: Quick Communication - See Telephone Encounter >> Feb 07, 2018  9:29 AM Ahmed Prima L wrote: CRM for notification. See Telephone encounter for: 02/07/18.  traMADol (ULTRAM) 50 MG tablet  [place], [place] [facility] [place]

## 2018-02-08 ENCOUNTER — Encounter (HOSPITAL_COMMUNITY): Payer: PPO

## 2018-02-08 ENCOUNTER — Encounter (HOSPITAL_COMMUNITY)
Admission: RE | Admit: 2018-02-08 | Discharge: 2018-02-08 | Disposition: A | Discharge status: Home / Self Care | Payer: PPO | Source: Ambulatory Visit | Attending: Cardiology | Admitting: Cardiology

## 2018-02-08 MED ORDER — TRAMADOL HCL 50 MG PO TABS: 50.0000 mg | ORAL_TABLET | Freq: Three times a day (TID) | ORAL | 2 refills | Status: DC | PRN

## 2018-02-08 NOTE — Telephone Encounter (Signed)
Tramadol refill Last OV:04/28/17  Last refill:01/19/18 #30/0 refill TUY:[id number] Pharmacy: [place], [place] [phone] (Phone) [phone] (Fax)

## 2018-02-10 ENCOUNTER — Encounter (HOSPITAL_COMMUNITY)
Admission: RE | Admit: 2018-02-10 | Discharge: 2018-02-10 | Disposition: A | Discharge status: Home / Self Care | Payer: PPO | Source: Ambulatory Visit | Attending: Cardiology | Admitting: Cardiology

## 2018-02-10 ENCOUNTER — Encounter (HOSPITAL_COMMUNITY): Payer: PPO

## 2018-02-13 ENCOUNTER — Encounter (HOSPITAL_COMMUNITY): Payer: Self-pay

## 2018-02-13 DIAGNOSIS — R5383 Other fatigue: Secondary | ICD-10-CM | POA: Insufficient documentation

## 2018-02-13 DIAGNOSIS — R0609 Other forms of dyspnea: Secondary | ICD-10-CM | POA: Insufficient documentation

## 2018-02-13 DIAGNOSIS — I2583 Coronary atherosclerosis due to lipid rich plaque: Secondary | ICD-10-CM | POA: Insufficient documentation

## 2018-02-13 DIAGNOSIS — I10 Essential (primary) hypertension: Secondary | ICD-10-CM | POA: Insufficient documentation

## 2018-02-13 DIAGNOSIS — I2510 Atherosclerotic heart disease of native coronary artery without angina pectoris: Secondary | ICD-10-CM | POA: Insufficient documentation

## 2018-02-15 ENCOUNTER — Encounter (HOSPITAL_COMMUNITY): Payer: Self-pay

## 2018-02-17 ENCOUNTER — Encounter (HOSPITAL_COMMUNITY): Payer: Self-pay

## 2018-02-17 ENCOUNTER — Encounter (HOSPITAL_COMMUNITY)
Admission: RE | Admit: 2018-02-17 | Discharge: 2018-02-17 | Disposition: A | Discharge status: Home / Self Care | Payer: Self-pay | Source: Ambulatory Visit | Attending: Cardiology | Admitting: Cardiology

## 2018-02-20 ENCOUNTER — Encounter (HOSPITAL_COMMUNITY): Payer: Self-pay

## 2018-02-20 ENCOUNTER — Encounter (HOSPITAL_COMMUNITY)
Admission: RE | Admit: 2018-02-20 | Discharge: 2018-02-20 | Disposition: A | Discharge status: Home / Self Care | Payer: Self-pay | Source: Ambulatory Visit | Attending: Cardiology | Admitting: Cardiology

## 2018-02-22 ENCOUNTER — Telehealth: Payer: Self-pay | Admitting: Family Medicine

## 2018-02-22 ENCOUNTER — Encounter (HOSPITAL_COMMUNITY): Payer: Self-pay

## 2018-02-22 ENCOUNTER — Encounter (HOSPITAL_COMMUNITY)
Admission: RE | Admit: 2018-02-22 | Discharge: 2018-02-22 | Disposition: A | Discharge status: Home / Self Care | Payer: Self-pay | Source: Ambulatory Visit | Attending: Cardiology | Admitting: Cardiology

## 2018-02-22 NOTE — Telephone Encounter (Signed)
Patient called and says tramadol does work ,she needs 2 a day would need 60 tablets for a month, Lyrica  worked a while ,and then stopped working  Gabapentin has never tried

## 2018-02-22 NOTE — Telephone Encounter (Signed)
LMOVM for pt to RTC/  PEC-Plz ask if tramadol is helping/ Ask if Lyrica helped in the past Ask if has tried Gabapentin and if it worked  Thx/dmf

## 2018-02-22 NOTE — Telephone Encounter (Signed)
Copied from [place] [id number]. Topic: Quick Communication - See Telephone Encounter >> Feb 22, 2018 10:57 AM Gardiner Ramus wrote: CRM for notification. See Telephone encounter for: 02/22/18. Pt was seen 01/31/18 with foot pain and foot pain is getting worse. She would like to talk to [facility] nurse please advise [phone]

## 2018-02-23 ENCOUNTER — Other Ambulatory Visit: Payer: Self-pay | Admitting: Family Medicine

## 2018-02-24 ENCOUNTER — Encounter (HOSPITAL_COMMUNITY)
Admission: RE | Admit: 2018-02-24 | Discharge: 2018-02-24 | Disposition: A | Discharge status: Home / Self Care | Payer: Self-pay | Source: Ambulatory Visit | Attending: Cardiology | Admitting: Cardiology

## 2018-02-24 ENCOUNTER — Encounter (HOSPITAL_COMMUNITY): Payer: Self-pay

## 2018-02-24 NOTE — Telephone Encounter (Signed)
TA-I know this is from 2 days ago and I have already faxed in the Rx for Tramadol but the [facility] got the answers to my questions that I had asked:  Lyrica-worked a while and then stopped working [employer] has never tried  Not sure if she was tried on Lyrica and then tried an increase or not though and not sure what dosage she was on previously/but I wanted you to know/plz advise is you would like for me to do anything else/thx dmf

## 2018-02-27 ENCOUNTER — Encounter (HOSPITAL_COMMUNITY)
Admission: RE | Admit: 2018-02-27 | Discharge: 2018-02-27 | Disposition: A | Discharge status: Home / Self Care | Payer: PPO | Source: Ambulatory Visit | Attending: Cardiology | Admitting: Cardiology

## 2018-02-27 ENCOUNTER — Encounter (HOSPITAL_COMMUNITY): Payer: Self-pay

## 2018-03-01 ENCOUNTER — Encounter (HOSPITAL_COMMUNITY)
Admission: RE | Admit: 2018-03-01 | Discharge: 2018-03-01 | Disposition: A | Discharge status: Home / Self Care | Payer: Self-pay | Source: Ambulatory Visit | Attending: Cardiology | Admitting: Cardiology

## 2018-03-01 ENCOUNTER — Encounter: Payer: Self-pay | Admitting: Family Medicine

## 2018-03-01 ENCOUNTER — Encounter (HOSPITAL_COMMUNITY): Payer: Self-pay

## 2018-03-02 NOTE — Telephone Encounter (Signed)
Pt aware/thx dmf

## 2018-03-02 NOTE — Telephone Encounter (Signed)
I am not sure if this has been followed up on- can you call patient to check up her.  How is she feeling?

## 2018-03-02 NOTE — Telephone Encounter (Signed)
Her Vit B12 is on the low end of normal so if she is not currently taking it, that is okay.  We can just continue to monitor it.

## 2018-03-02 NOTE — Telephone Encounter (Signed)
TA-She says that she feels she is doing PT and getting stronger/having days where her bones hurt and about 1 day a week it is unbearable/she did get the Rx for the Tramadol and says thank you  She does want to know if oral B-12 is necessary for her to take/said that it was [place] Marian/C years ago from card? But that you were going to look into that? Plz advise/thx dmf

## 2018-03-03 ENCOUNTER — Encounter (HOSPITAL_COMMUNITY): Payer: Self-pay

## 2018-03-03 ENCOUNTER — Encounter (HOSPITAL_COMMUNITY)
Admission: RE | Admit: 2018-03-03 | Discharge: 2018-03-03 | Disposition: A | Discharge status: Home / Self Care | Payer: Self-pay | Source: Ambulatory Visit | Attending: Cardiology | Admitting: Cardiology

## 2018-03-06 ENCOUNTER — Encounter (HOSPITAL_COMMUNITY): Payer: Self-pay

## 2018-03-06 ENCOUNTER — Encounter (HOSPITAL_COMMUNITY)
Admission: RE | Admit: 2018-03-06 | Discharge: 2018-03-06 | Disposition: A | Discharge status: Home / Self Care | Payer: PPO | Source: Ambulatory Visit | Attending: Cardiology | Admitting: Cardiology

## 2018-03-08 ENCOUNTER — Encounter (HOSPITAL_COMMUNITY): Payer: Self-pay

## 2018-03-10 ENCOUNTER — Encounter (HOSPITAL_COMMUNITY): Payer: Self-pay

## 2018-03-10 ENCOUNTER — Encounter (HOSPITAL_COMMUNITY)
Admission: RE | Admit: 2018-03-10 | Discharge: 2018-03-10 | Disposition: A | Discharge status: Home / Self Care | Payer: Self-pay | Source: Ambulatory Visit | Attending: Cardiology | Admitting: Cardiology

## 2018-03-13 ENCOUNTER — Encounter (HOSPITAL_COMMUNITY): Payer: Self-pay

## 2018-03-13 ENCOUNTER — Encounter (HOSPITAL_COMMUNITY)
Admission: RE | Admit: 2018-03-13 | Discharge: 2018-03-13 | Disposition: A | Discharge status: Home / Self Care | Payer: Self-pay | Source: Ambulatory Visit | Attending: Cardiology | Admitting: Cardiology

## 2018-03-15 ENCOUNTER — Encounter (HOSPITAL_COMMUNITY): Payer: Self-pay

## 2018-03-15 ENCOUNTER — Encounter (HOSPITAL_COMMUNITY)
Admission: RE | Admit: 2018-03-15 | Discharge: 2018-03-15 | Disposition: A | Discharge status: Home / Self Care | Payer: Self-pay | Source: Ambulatory Visit | Attending: Cardiology | Admitting: Cardiology

## 2018-03-17 ENCOUNTER — Encounter (HOSPITAL_COMMUNITY)
Admission: RE | Admit: 2018-03-17 | Discharge: 2018-03-17 | Disposition: A | Discharge status: Home / Self Care | Payer: PPO | Source: Ambulatory Visit | Attending: Cardiology | Admitting: Cardiology

## 2018-03-17 DIAGNOSIS — I2510 Atherosclerotic heart disease of native coronary artery without angina pectoris: Secondary | ICD-10-CM | POA: Diagnosis not present

## 2018-03-17 DIAGNOSIS — I2583 Coronary atherosclerosis due to lipid rich plaque: Secondary | ICD-10-CM | POA: Diagnosis not present

## 2018-03-17 DIAGNOSIS — I10 Essential (primary) hypertension: Secondary | ICD-10-CM | POA: Insufficient documentation

## 2018-03-17 DIAGNOSIS — R5383 Other fatigue: Secondary | ICD-10-CM | POA: Insufficient documentation

## 2018-03-17 DIAGNOSIS — R0609 Other forms of dyspnea: Secondary | ICD-10-CM | POA: Diagnosis not present

## 2018-03-20 ENCOUNTER — Encounter (HOSPITAL_COMMUNITY)
Admission: RE | Admit: 2018-03-20 | Discharge: 2018-03-20 | Disposition: A | Discharge status: Home / Self Care | Payer: PPO | Source: Ambulatory Visit | Attending: Cardiology | Admitting: Cardiology

## 2018-03-22 ENCOUNTER — Encounter (HOSPITAL_COMMUNITY)
Admission: RE | Admit: 2018-03-22 | Discharge: 2018-03-22 | Disposition: A | Discharge status: Home / Self Care | Payer: PPO | Source: Ambulatory Visit | Attending: Cardiology | Admitting: Cardiology

## 2018-03-24 ENCOUNTER — Encounter (HOSPITAL_COMMUNITY)
Admission: RE | Admit: 2018-03-24 | Discharge: 2018-03-24 | Disposition: A | Discharge status: Home / Self Care | Payer: PPO | Source: Ambulatory Visit | Attending: Cardiology | Admitting: Cardiology

## 2018-03-28 ENCOUNTER — Encounter (HOSPITAL_COMMUNITY)
Admission: RE | Admit: 2018-03-28 | Discharge: 2018-03-28 | Disposition: A | Discharge status: Home / Self Care | Payer: PPO | Source: Ambulatory Visit | Attending: Cardiology | Admitting: Cardiology

## 2018-03-29 ENCOUNTER — Encounter (HOSPITAL_COMMUNITY)
Admission: RE | Admit: 2018-03-29 | Discharge: 2018-03-29 | Disposition: A | Discharge status: Home / Self Care | Payer: PPO | Source: Ambulatory Visit | Attending: Cardiology | Admitting: Cardiology

## 2018-03-31 ENCOUNTER — Encounter (HOSPITAL_COMMUNITY)
Admission: RE | Admit: 2018-03-31 | Discharge: 2018-03-31 | Disposition: A | Discharge status: Home / Self Care | Payer: PPO | Source: Ambulatory Visit | Attending: Cardiology | Admitting: Cardiology

## 2018-04-03 ENCOUNTER — Encounter (HOSPITAL_COMMUNITY)
Admission: RE | Admit: 2018-04-03 | Discharge: 2018-04-03 | Disposition: A | Discharge status: Home / Self Care | Payer: PPO | Source: Ambulatory Visit | Attending: Cardiology | Admitting: Cardiology

## 2018-04-05 ENCOUNTER — Encounter (HOSPITAL_COMMUNITY)
Admission: RE | Admit: 2018-04-05 | Discharge: 2018-04-05 | Disposition: A | Discharge status: Home / Self Care | Payer: PPO | Source: Ambulatory Visit | Attending: Cardiology | Admitting: Cardiology

## 2018-04-10 ENCOUNTER — Ambulatory Visit: Payer: PPO | Admitting: Emergency Medicine

## 2018-04-10 ENCOUNTER — Encounter: Payer: Self-pay | Admitting: Emergency Medicine

## 2018-04-10 ENCOUNTER — Encounter (HOSPITAL_COMMUNITY)
Admission: RE | Admit: 2018-04-10 | Discharge: 2018-04-10 | Disposition: A | Discharge status: Home / Self Care | Payer: PPO | Source: Ambulatory Visit | Attending: Cardiology | Admitting: Cardiology

## 2018-04-10 DIAGNOSIS — K219 Gastro-esophageal reflux disease without esophagitis: Secondary | ICD-10-CM

## 2018-04-10 DIAGNOSIS — R0609 Other forms of dyspnea: Secondary | ICD-10-CM

## 2018-04-10 DIAGNOSIS — J301 Allergic rhinitis due to pollen: Secondary | ICD-10-CM | POA: Diagnosis not present

## 2018-04-10 NOTE — Assessment & Plan Note (Signed)
Improved.  Suspect that much of this was deconditioning.  Also notes that her CPST showed a profound hypertensive response.  Her antihypertensives were adjusted and this may have helped her as well..  Encouraged her on continued exercise and conditioning.  She can follow with me if her dyspnea returns.

## 2018-04-10 NOTE — Patient Instructions (Signed)
Congratulations on your progress with cardiopulmonary rehab Please continue loratadine, fluticasone nasal spray and omeprazole as you are taking them  Follow with Dr Lamonte Sakai if needed

## 2018-04-10 NOTE — Assessment & Plan Note (Signed)
Continue omeprazole as ordered 

## 2018-04-10 NOTE — Assessment & Plan Note (Signed)
On loratadine daily, fluticasone nasal spray as needed

## 2018-04-10 NOTE — Progress Notes (Signed)
Subjective:    Patient ID: Hannah Vasquez, female    DOB: September 20, 1950, 67 y.o.   MRN: [id number]  Shortness of Breath  Associated symptoms include ear pain and headaches. Pertinent negatives include no fever, leg swelling, rash, rhinorrhea, sore throat, vomiting or wheezing.   67 year old never smoker with a history of hypertension, coronary artery disease status post MI 12/2016, mitral valve prolapse.  She has had continued fatigue and exertional dyspnea since spring 2018, was admitted 08/2017 with atypical chest pain, fatigue and dyspnea.  Her medications were adjusted -beta-blockade decreased, antiplatelet medication altered.  Her echocardiogram in 03/10/17 showed improvement in her left ventricular function following her MI.  She did cardiac rehab post-MI, is about to restart a maintenance program.   As part of her evaluation of dyspnea pulmonary function testing was done on 09/06/17 that I have reviewed.  This shows normal spirometry, no bronchodilator response.  Her lung volumes are normal.  Her diffusion capacity was slightly decreased but corrected to the normal range when adjusted for her alveolar volume. She notes that she was on pred for a rash at the time of this testing.   She describes difficulty getting a deep breath, feels some dyspnea and tightness that can happen at any time.  She has an albuterol inhaler that she received from PCP many yrs ago, unclear whether she had a formal dx asthma. Hasn't used it since her MI (didn't help her pre-MI). She felt that her breathing improved after cardiac rehab, but then recurrence of dyspnea and exhaustion beginning in January 2019 with an admission as above. She was treated for a rash a week after the hospitalization.   Now continues to have some dyspnea with weather extremes, with housework, with talking a lot. can be associated with hoarseness.  She has some increased nasal gtt, keeps a tissue with her. She is on loratadine, underwent an EGD with  dilation in the past, is on omeprazole 3m. Denies any GERD sx currently.   CXR 08/21/17 >> normal  ROV 04/10/18 --this is a follow-up visit for exertional shortness of breath.  She has stable coronary artery disease, mitral valve prolapse.  Her spirometry has been reassuring.  She is been participating in cardiopulmonary rehab and feels that she has improved significantly. She does a lot of walking, step machine, rowing machine. CPST was done and she had a severe HTN response - losartan was adjusted.   She is on omeprazole and GERD better controlled with this and diet. She is on loratadine qd and flonase prn.       Review of Systems  Constitutional: Negative for fever and unexpected weight change.  HENT: Positive for congestion, dental problem and ear pain. Negative for nosebleeds, postnasal drip, rhinorrhea, sneezing, sore throat and trouble swallowing.   Eyes: Positive for itching. Negative for redness.  Respiratory: Positive for chest tightness and shortness of breath. Negative for cough and wheezing.   Cardiovascular: Positive for palpitations. Negative for leg swelling.  Gastrointestinal: Positive for nausea. Negative for vomiting.  Genitourinary: Negative for dysuria.  Musculoskeletal: Negative for joint swelling.  Skin: Negative for rash.  Allergic/Immunologic: Negative.  Negative for environmental allergies, food allergies and immunocompromised state.  Neurological: Positive for headaches.  Hematological: Bruises/bleeds easily.  Psychiatric/Behavioral: Negative for dysphoric mood. The patient is not nervous/anxious.     Past Medical History:  Diagnosis Date  . Allergy   . Anxiety   . CAD (coronary artery disease)   . Cataract   . Hemorrhoids   .  Hypertension   . Mitral valve prolapse   . Osteoporosis   . Status post dilation of esophageal narrowing      Family History  Problem Relation Age of Onset  . Mitral valve prolapse Mother   . Hypertension Father   . Brain  cancer Father      Social History   Socioeconomic History  . Marital status: Divorced    Spouse name: Not on file  . Number of children: 2  . Years of education: Not on file  . Highest education level: Not on file  Occupational History  . 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 Financial resource strain: Not on file  . Food insecurity:    Worry: Not on file    Inability: Not on file  . Transportation needs:    Medical: Not on file    Non-medical: Not on file  Tobacco Use  . Smoking status: Never Smoker  . Smokeless tobacco: Never Used  Substance and Sexual Activity  . Alcohol use: No    Alcohol/week: 0.0 standard drinks  . Drug use: No  . Sexual activity: Yes  Lifestyle  . Physical activity:    Days per week: Not on file    Minutes per session: Not on file  . Stress: Not on file  Relationships  . Social connections:    Talks on phone: Not on file    Gets together: Not on file    Attends religious service: Not on file    Active member of club or organization: Not on file    Attends meetings of clubs or organizations: Not on file    Relationship status: Not on file  . Intimate partner violence:    Fear of current or ex partner: Not on file    Emotionally abused: Not on file    Physically abused: Not on file    Forced sexual activity: Not on file  Other Topics Concern  . Not on file  Social History Narrative   Has a living will.   HPOA- daughter.   She would desire CPR.  Would desire life support if necessary and not futile.           Allergies  Allergen Reactions  . Bactroban [Mupirocin Calcium]     Facial redness and swelling  . Codeine     REACTION: nausea and vomiting  . Penicillins Nausea And Vomiting    REACTION: rash  . Valtrex [Valacyclovir Hcl] Swelling    Per pt causes redness     Outpatient Medications Prior to Visit  Medication Sig Dispense Refill  . albuterol (PROVENTIL HFA;VENTOLIN HFA) 108 (90  Base) MCG/ACT inhaler Inhale 1 puff into the lungs every 6 (six) hours as needed for wheezing or shortness of breath.    Marland Kitchen amLODipine (NORVASC) 5 MG tablet Take 1 tablet (5 mg total) by mouth daily. 90 tablet 3  . aspirin EC 81 MG tablet Take 81 mg by mouth every morning.     Marland Kitchen atorvastatin (LIPITOR) 80 MG tablet TAKE 1 TABLET BY MOUTH ONCE A DAY AT 6PM 30 tablet 12  . Cholecalciferol (VITAMIN D3) 1000 UNITS CAPS Take 1 capsule by mouth every morning.     . fluticasone (FLONASE) 50 MCG/ACT nasal spray Place into both nostrils daily.    . furosemide (LASIX) 20 MG tablet Take 1 tablet (20 mg total) by mouth daily as needed for edema. 90 tablet 3  . isosorbide mononitrate (IMDUR)  30 MG 24 hr tablet TAKE 1 TABLET BY MOUTH ONCE A DAY 90 tablet 3  . loratadine (CLARITIN) 10 MG tablet Take 1 tablet (10 mg total) by mouth every morning. 30 tablet 2  . losartan (COZAAR) 100 MG tablet Take 1 tablet (100 mg total) by mouth daily. 90 tablet 1  . metoprolol succinate (TOPROL-XL) 25 MG 24 hr tablet Take 1 tablet (25 mg total) by mouth daily. 30 tablet 0  . Multiple Vitamin (MULTIVITAMIN) tablet Take 1 tablet by mouth every morning.     . nitroGLYCERIN (NITROSTAT) 0.4 MG SL tablet Place 1 tablet (0.4 mg total) under the tongue every 5 (five) minutes as needed for chest pain. 25 tablet 3  . Omega-3 Fatty Acids (FISH OIL) 1200 MG CAPS Take 1 capsule by mouth every morning.     Marland Kitchen omeprazole (PRILOSEC) 40 MG capsule Take 40 mg by mouth daily.     . potassium chloride (K-DUR) 10 MEQ tablet Take 10 mEq daily as needed by mouth. If taking Furosemide (Lasix)    . traMADol (ULTRAM) 50 MG tablet TAKE 1 TABLET BY MOUTH EVERY 8 HOURS AS NEEDED FOR PAIN 60 tablet 2   No facility-administered medications prior to visit.         Objective:   Physical Exam Vitals:   04/10/18 1021  BP: 114/82  Pulse: 82  SpO2: 100%  Weight: 146 lb (66.2 kg)  Height: 5' 2"  (1.575 m)   Gen: Pleasant, well-nourished, in no distress,   normal affect  ENT: No lesions,  mouth clear,  oropharynx clear, no postnasal drip  Neck: No JVD, no TMG, no carotid bruits  Lungs: No use of accessory muscles, clear B  Cardiovascular: RRR, distant, no murmur or gallops, no peripheral edema  Musculoskeletal: No deformities, no cyanosis or clubbing  Neuro: alert, non focal  Skin: Warm, no lesions or rash      Assessment & Plan:  Allergic rhinitis On loratadine daily, fluticasone nasal spray as needed  GERD (gastroesophageal reflux disease) Continue omeprazole as ordered  Dyspnea on exertion Improved.  Suspect that much of this was deconditioning.  Also notes that her CPST showed a profound hypertensive response.  Her antihypertensives were adjusted and this may have helped her as well..  Encouraged her on continued exercise and conditioning.  She can follow with me if her dyspnea returns.  Baltazar Apo, MD, PhD 04/10/2018, 10:55 AM [facility] [phone] or if no answer [phone]

## 2018-04-12 ENCOUNTER — Ambulatory Visit: Payer: PPO | Admitting: Behavioral Health

## 2018-04-13 NOTE — Progress Notes (Signed)
Subjective:   Hannah Vasquez is a 67 y.o. female who presents for Medicare Annual (Subsequent) preventive examination.  Review of Systems:  No ROS.  Medicare Wellness Visit. Additional risk factors are reflected in the social history.  Cardiac Risk Factors include: advanced age (>52mn, >>21women);hypertension Sleep patterns:no issues Home Safety/Smoke Alarms: Feels safe in home. Smoke alarms in place. Lives in 1 story home. Lives alone.    Female:   Pap- Dr.Banga 10/07/17- vaginal exam. Hysterectomy.      Mammo-utd       Dexa scan-  ordered      CCS-utd      Objective:     Vitals: BP 138/80 (BP Location: Right Arm, Patient Position: Sitting, Cuff Size: Normal)   Pulse 60   Ht 5' 2"  (1.575 m)   Wt 145 lb 6.4 oz (66 kg)   LMP  (LMP Unknown)   SpO2 98%   BMI 26.59 kg/m   Body mass index is 26.59 kg/m.  Advanced Directives 04/19/2018 02/22/2017 01/02/2017 01/01/2017 09/20/2016 03/29/2016  Does Patient Have a Medical Advance Directive? Yes Yes Yes No Yes Yes  Type of [place]aramed[place]of [place]St. [place]Living will - [place]Living will - [place]Living will [place]Living will  Does patient want to make changes to medical advance directive? - No - Patient declined No - Patient declined - - No - Patient declined  Copy of [place]Princetonin Chart? No - copy requested - No - copy requested - No - copy requested No - copy requested    Tobacco Social History   Tobacco Use  Smoking Status Never Smoker  Smokeless Tobacco Never Used     Counseling given: Not Answered   Clinical Intake: Pain : No/denies pain    Past Medical History:  Diagnosis Date  . Allergy   . Anxiety   . CAD (coronary artery disease)   . Cataract   . Hemorrhoids   . Hypertension   . Mitral valve prolapse   . Osteoporosis   . Status post dilation of esophageal narrowing    Past Surgical History:  Procedure  Laterality Date  . BASAL CELL CARCINOMA EXCISION  02/05/2016   [facility]  . BLADDER SURGERY  11/15/1995   bladder neck suspension, urinary incontinence  . CARDIAC CATHETERIZATION  01/03/2017 DES LAD  . CATARACT EXTRACTION Left 04/30/2016   [facility] [place] [place] [facility] 01/03/2017   Procedure: Coronary Stent Intervention;  Surgeon: ENelva Bush MD;  [facility];  Service: Cardiovascular;  Laterality: N/A;  . DENTAL SURGERY  06/20/2016   Tooth Implant; Dr. BKalman Shan . INTRAVASCULAR ULTRASOUND/IVUS N/A 01/03/2017   Procedure: Intravascular Ultrasound/IVUS;  Surgeon: ENelva Bush MD;  [facility];  Service: Cardiovascular;  Laterality: N/A;  . KNEE SURGERY  2003   torn meniscus  . LEFT HEART CATH AND CORONARY ANGIOGRAPHY N/A 01/03/2017   Procedure: Left Heart Cath and Coronary Angiography;  Surgeon: ENelva Bush MD;  [facility];  Service: Cardiovascular;  Laterality: N/A;  . MOHS SURGERY Right 2017   right side of face  . ROTATOR CUFF REPAIR    . SHOULDER SURGERY Right 2005   Family History  Problem Relation Age of Onset  . Mitral valve prolapse Mother   . Hypertension Father   . Brain cancer Father    Social History   Socioeconomic History  .  Marital status: Divorced    Spouse name: Not on file  . Number of children: 2  . Years of education: Not on file  . Highest education level: Not on file  Occupational History  . 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 Financial resource strain: Not on file  . Food insecurity:    Worry: Not on file    Inability: Not on file  . Transportation needs:    Medical: Not on file    Non-medical: Not on file  Tobacco Use  . Smoking status: Never Smoker  . Smokeless tobacco: Never Used  Substance and Sexual Activity  . Alcohol use: No    Alcohol/week: 0.0 standard drinks  . Drug use: No   . Sexual activity: Not Currently  Lifestyle  . Physical activity:    Days per week: Not on file    Minutes per session: Not on file  . Stress: Not on file  Relationships  . Social connections:    Talks on phone: Not on file    Gets together: Not on file    Attends religious service: Not on file    Active member of club or organization: Not on file    Attends meetings of clubs or organizations: Not on file    Relationship status: Not on file  Other Topics Concern  . Not on file  Social History Narrative   Has a living will.   HPOA- daughter.   She would desire CPR.  Would desire life support if necessary and not futile.          Outpatient Encounter Medications as of 04/19/2018  Medication Sig  . albuterol (PROVENTIL HFA;VENTOLIN HFA) 108 (90 Base) MCG/ACT inhaler Inhale 1 puff into the lungs every 6 (six) hours as needed for wheezing or shortness of breath.  Marland Kitchen amLODipine (NORVASC) 5 MG tablet Take 1 tablet (5 mg total) by mouth daily.  Marland Kitchen aspirin EC 81 MG tablet Take 81 mg by mouth every morning.   Marland Kitchen atorvastatin (LIPITOR) 80 MG tablet TAKE 1 TABLET BY MOUTH ONCE A DAY AT 6PM  . Cholecalciferol (VITAMIN D3) 1000 UNITS CAPS Take 1 capsule by mouth every morning.   . fluticasone (FLONASE) 50 MCG/ACT nasal spray Place into both nostrils daily.  . furosemide (LASIX) 20 MG tablet Take 1 tablet (20 mg total) by mouth daily as needed for edema.  . isosorbide mononitrate (IMDUR) 30 MG 24 hr tablet TAKE 1 TABLET BY MOUTH ONCE A DAY  . loratadine (CLARITIN) 10 MG tablet Take 1 tablet (10 mg total) by mouth every morning.  Marland Kitchen losartan (COZAAR) 100 MG tablet Take 1 tablet (100 mg total) by mouth daily.  . metoprolol succinate (TOPROL-XL) 25 MG 24 hr tablet Take 1 tablet (25 mg total) by mouth daily.  . Multiple Vitamin (MULTIVITAMIN) tablet Take 1 tablet by mouth every morning.   . Omega-3 Fatty Acids (FISH OIL) 1200 MG CAPS Take 1 capsule by mouth every morning.   Marland Kitchen omeprazole (PRILOSEC) 40  MG capsule Take 40 mg by mouth daily.   . potassium chloride (K-DUR) 10 MEQ tablet Take 10 mEq daily as needed by mouth. If taking Furosemide (Lasix)  . traMADol (ULTRAM) 50 MG tablet TAKE 1 TABLET BY MOUTH EVERY 8 HOURS AS NEEDED FOR PAIN  . nitroGLYCERIN (NITROSTAT) 0.4 MG SL tablet Place 1 tablet (0.4 mg total) under the tongue every 5 (five) minutes as needed for chest pain. (Patient not taking:  Reported on 04/19/2018)   No facility-administered encounter medications on file as of 04/19/2018.     Activities of Daily Living In your present state of health, do you have any difficulty performing the following activities: 04/19/2018 01/10/2018  Hearing? Y N  Comment pt requests referral to audiology. Failed whisper test.  -  Vision? Y N  Difficulty concentrating or making decisions? N N  Walking or climbing stairs? N N  Dressing or bathing? N N  Doing errands, shopping? N N  Preparing Food and eating ? N -  Using the Toilet? N -  In the past six months, have you accidently leaked urine? Y -  Do you have problems with loss of bowel control? N -  Managing your Medications? N -  Managing your Finances? N -  Housekeeping or managing your Housekeeping? N -  Some recent data might be hidden    Patient Care Team: Lucille Passy, MD as PCP - General (Family Medicine) Jerline Pain, MD as PCP - Cardiology (Cardiology) Jerline Pain, MD as Consulting Physician (Cardiology) Darleen Crocker, MD as Consulting Physician (Ophthalmology) Thelma Comp, [place] as Consulting Physician (Optometry) Griselda Miner, MD as Consulting Physician (Dermatology) Renee Pain, DDS as Consulting Physician (Dentistry)    Assessment:   This is a routine wellness examination for Hannah Vasquez. Physical assessment deferred to PCP.   Exercise Activities and Dietary recommendations Current Exercise Habits: Home exercise routine(cardio rehab), Type of exercise: strength training/weights;treadmill;walking, Time (Minutes):  45, Frequency (Times/Week): 3, Weekly Exercise (Minutes/Week): 135, Intensity: Mild, Exercise limited by: None identified Diet (meal preparation, eat out, water intake, caffeinated beverages, dairy products, fruits and vegetables): well balanced      Goals    . Increase physical activity     When weather permits, I will resume walking 30-60 min 2-3 days per week.        Fall Risk Fall Risk  04/19/2018 01/10/2018 02/22/2017 09/20/2016 09/16/2015  Falls in the past year? No No Yes No Yes  Number falls in past yr: - - 2 or more - 2 or more  Injury with Fall? - - Yes - No  Comment - - L sided elbow and shoulder, hit back of (L-sided) head and R arm area - -    Depression Screen PHQ 2/9 Scores 04/19/2018 01/10/2018 06/01/2017 03/02/2017  PHQ - 2 Score 0 0 0 0     Cognitive Function MMSE - Mini Mental State Exam 04/19/2018 09/20/2016  Orientation to time 5 5  Orientation to Place 5 5  Registration 3 3  Attention/ Calculation 5 0  Recall 3 3  Language- name 2 objects 2 0  Language- repeat 1 1  Language- follow 3 step command 3 3  Language- read & follow direction 1 0  Write a sentence 1 0  Copy design 1 0  Total score 30 20        Immunization History  Administered Date(s) Administered  . Influenza Split 05/27/2011  . Influenza Whole 06/04/2009  . Influenza,inj,Quad PF,6+ Mos 06/07/2013, 06/12/2014, 05/09/2015, 06/11/2016, 06/03/2017  . Pneumococcal Conjugate-13 11/27/2014  . Pneumococcal Polysaccharide-23 11/08/2013  . Td 08/16/1993  . Tdap 09/09/2011  . Zoster 11/19/2009    Screening Tests Health Maintenance  Topic Date Due  . INFLUENZA VACCINE  03/16/2018  . MAMMOGRAM  06/30/2018  . TETANUS/TDAP  09/08/2021  . COLONOSCOPY  12/29/2026  . DEXA SCAN  Completed  . Hepatitis C Screening  Completed  . PNA vac Low Risk Adult  Completed       Plan:    Please schedule your next medicare wellness visit with me in 1 yr.  Continue to eat heart healthy diet (full of fruits,  vegetables, whole grains, lean protein, water--limit salt, fat, and sugar intake) and increase physical activity as tolerated.  I have ordered your bone density scan. Please schedule.  I have placed a referral to audiology for your hearing concerns. They should contact you to schedule.   Bring a copy of your living will and/or healthcare power of attorney to your next office visit.   I have personally reviewed and noted the following in the patient's chart:   . Medical and social history . Use of alcohol, tobacco or illicit drugs  . Current medications and supplements . Functional ability and status . Nutritional status . Physical activity . Advanced directives . List of other physicians . Hospitalizations, surgeries, and ER visits in previous 12 months . Vitals . Screenings to include cognitive, depression, and falls . Referrals and appointments  In addition, I have reviewed and discussed with patient certain preventive protocols, quality metrics, and best practice recommendations. A written personalized care plan for preventive services as well as general preventive health recommendations were provided to patient.     Shela Nevin, [place]  04/19/2018

## 2018-04-13 NOTE — Progress Notes (Deleted)
Subjective:   Hannah Vasquez is a 67 y.o. female who presents for an Initial Medicare Annual Wellness Visit.  Review of Systems            Objective:    There were no vitals filed for this visit. There is no height or weight on file to calculate BMI.  Advanced Directives 02/22/2017 01/02/2017 01/01/2017 09/20/2016 03/29/2016  Does Patient Have a Medical Advance Directive? Yes Yes No Yes Yes  Type of Advance Directive - [place];Living will - [place];Living will [place];Living will  Does patient want to make changes to medical advance directive? No - Patient declined No - Patient declined - - No - Patient declined  Copy of [place] in Chart? - No - copy requested - No - copy requested No - copy requested    Current Medications (verified) Outpatient Encounter Medications as of 04/19/2018  Medication Sig  . albuterol (PROVENTIL HFA;VENTOLIN HFA) 108 (90 Base) MCG/ACT inhaler Inhale 1 puff into the lungs every 6 (six) hours as needed for wheezing or shortness of breath.  Marland Kitchen amLODipine (NORVASC) 5 MG tablet Take 1 tablet (5 mg total) by mouth daily.  Marland Kitchen aspirin EC 81 MG tablet Take 81 mg by mouth every morning.   Marland Kitchen atorvastatin (LIPITOR) 80 MG tablet TAKE 1 TABLET BY MOUTH ONCE A DAY AT 6PM  . Cholecalciferol (VITAMIN D3) 1000 UNITS CAPS Take 1 capsule by mouth every morning.   . fluticasone (FLONASE) 50 MCG/ACT nasal spray Place into both nostrils daily.  . furosemide (LASIX) 20 MG tablet Take 1 tablet (20 mg total) by mouth daily as needed for edema.  . isosorbide mononitrate (IMDUR) 30 MG 24 hr tablet TAKE 1 TABLET BY MOUTH ONCE A DAY  . loratadine (CLARITIN) 10 MG tablet Take 1 tablet (10 mg total) by mouth every morning.  Marland Kitchen losartan (COZAAR) 100 MG tablet Take 1 tablet (100 mg total) by mouth daily.  . metoprolol succinate (TOPROL-XL) 25 MG 24 hr tablet Take 1 tablet (25 mg total) by mouth daily.  . Multiple  Vitamin (MULTIVITAMIN) tablet Take 1 tablet by mouth every morning.   . nitroGLYCERIN (NITROSTAT) 0.4 MG SL tablet Place 1 tablet (0.4 mg total) under the tongue every 5 (five) minutes as needed for chest pain.  . Omega-3 Fatty Acids (FISH OIL) 1200 MG CAPS Take 1 capsule by mouth every morning.   Marland Kitchen omeprazole (PRILOSEC) 40 MG capsule Take 40 mg by mouth daily.   . potassium chloride (K-DUR) 10 MEQ tablet Take 10 mEq daily as needed by mouth. If taking Furosemide (Lasix)  . traMADol (ULTRAM) 50 MG tablet TAKE 1 TABLET BY MOUTH EVERY 8 HOURS AS NEEDED FOR PAIN   No facility-administered encounter medications on file as of 04/19/2018.     Allergies (verified) Bactroban [mupirocin calcium]; Codeine; Penicillins; and Valtrex [valacyclovir hcl]   History: Past Medical History:  Diagnosis Date  . Allergy   . Anxiety   . CAD (coronary artery disease)   . Cataract   . Hemorrhoids   . Hypertension   . Mitral valve prolapse   . Osteoporosis   . Status post dilation of esophageal narrowing    Past Surgical History:  Procedure Laterality Date  . BASAL CELL CARCINOMA EXCISION  02/05/2016   Dr. Sarajane Jews, [facility]  . BLADDER SURGERY  11/15/1995   bladder neck suspension, urinary incontinence  . CARDIAC CATHETERIZATION  01/03/2017 DES LAD  .  CATARACT EXTRACTION Left 04/30/2016   Dr. Tommy Rainwater, [place] N/A 01/03/2017   Procedure: Coronary Stent Intervention;  Surgeon: Nelva Bush, MD;  Location: [place] [facility];  Service: Cardiovascular;  Laterality: N/A;  . DENTAL SURGERY  06/20/2016   Tooth Implant; Dr. Kalman Shan  . INTRAVASCULAR ULTRASOUND/IVUS N/A 01/03/2017   Procedure: Intravascular Ultrasound/IVUS;  Surgeon: Nelva Bush, MD;  Location: [place] [facility];  Service: Cardiovascular;  Laterality: N/A;  . KNEE SURGERY  2003   torn meniscus  . LEFT HEART CATH AND CORONARY ANGIOGRAPHY N/A 01/03/2017   Procedure: Left Heart Cath and  Coronary Angiography;  Surgeon: Nelva Bush, MD;  Location: [place] [facility];  Service: Cardiovascular;  Laterality: N/A;  . MOHS SURGERY Right 2017   right side of face  . ROTATOR CUFF REPAIR    . SHOULDER SURGERY Right 2005   Family History  Problem Relation Age of Onset  . Mitral valve prolapse Mother   . Hypertension Father   . Brain cancer Father    Social History   Socioeconomic History  . Marital status: Divorced    Spouse name: Not on file  . Number of children: 2  . Years of education: Not on file  . Highest education level: Not on file  Occupational History  . 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 Financial resource strain: Not on file  . Food insecurity:    Worry: Not on file    Inability: Not on file  . Transportation needs:    Medical: Not on file    Non-medical: Not on file  Tobacco Use  . Smoking status: Never Smoker  . Smokeless tobacco: Never Used  Substance and Sexual Activity  . Alcohol use: No    Alcohol/week: 0.0 standard drinks  . Drug use: No  . Sexual activity: Yes  Lifestyle  . Physical activity:    Days per week: Not on file    Minutes per session: Not on file  . Stress: Not on file  Relationships  . Social connections:    Talks on phone: Not on file    Gets together: Not on file    Attends religious service: Not on file    Active member of club or organization: Not on file    Attends meetings of clubs or organizations: Not on file    Relationship status: Not on file  Other Topics Concern  . Not on file  Social History Narrative   Has a living will.   HPOA- daughter.   She would desire CPR.  Would desire life support if necessary and not futile.          Tobacco Counseling Counseling given: Not Answered   Clinical Intake:                        Activities of Daily Living In your present state of health, do you have any difficulty performing the following  activities: 01/10/2018  Hearing? N  Vision? N  Difficulty concentrating or making decisions? N  Walking or climbing stairs? N  Dressing or bathing? N  Doing errands, shopping? N  Some recent data might be hidden     Immunizations and Health Maintenance Immunization History  Administered Date(s) Administered  . Influenza Split 05/27/2011  . Influenza Whole 06/04/2009  . Influenza,inj,Quad PF,6+ Mos 06/07/2013, 06/12/2014, 05/09/2015, 06/11/2016, 06/03/2017  . Pneumococcal Conjugate-13 11/27/2014  . Pneumococcal  Polysaccharide-23 11/08/2013  . Td 08/16/1993  . Tdap 09/09/2011  . Zoster 11/19/2009   Health Maintenance Due  Topic Date Due  . INFLUENZA VACCINE  03/16/2018    Patient Care Team: Lucille Passy, MD as PCP - General (Family Medicine) Jerline Pain, MD as PCP - Cardiology (Cardiology) Jerline Pain, MD as Consulting Physician (Cardiology) Darleen Crocker, MD as Consulting Physician (Ophthalmology) Thelma Comp, [place] as Consulting Physician (Optometry) Griselda Miner, MD as Consulting Physician (Dermatology) Renee Pain, DDS as Consulting Physician (Dentistry)  Indicate any recent Medical Services you may have received from other than [facility] providers in the past year (date may be approximate).     Assessment:   This is a routine wellness examination for [place].  Hearing/Vision screen No exam data present  Dietary issues and exercise activities discussed:    Goals    . Increase physical activity     When weather permits, I will resume walking 30-60 min 2-3 days per week.       Depression Screen PHQ 2/9 Scores 01/10/2018 06/01/2017 03/02/2017 09/20/2016 09/16/2015 11/27/2014 11/27/2014  PHQ - 2 Score 0 0 0 0 0 0 0    Fall Risk Fall Risk  01/10/2018 02/22/2017 09/20/2016 09/16/2015 11/27/2014  Falls in the past year? No Yes No Yes No  Number falls in past yr: - 2 or more - 2 or more -  Injury with Fall? - Yes - No -  Comment - L sided elbow and shoulder, hit  back of (L-sided) head and R arm area - - -    Is the patient's home free of loose throw rugs in walkways, pet beds, electrical cords, etc?   {Blank single:19197::"yes","no"}      Grab bars in the bathroom? {Blank single:19197::"yes","no"}      Handrails on the stairs?   {Blank single:19197::"yes","no"}      Adequate lighting?   {Blank single:19197::"yes","no"}  Timed Get Up and Go Performed ***  Cognitive Function: MMSE - Mini Mental State Exam 09/20/2016  Orientation to time 5  Orientation to Place 5  Registration 3  Attention/ Calculation 0  Recall 3  Language- name 2 objects 0  Language- repeat 1  Language- follow 3 step command 3  Language- read & follow direction 0  Write a sentence 0  Copy design 0  Total score 20        Screening Tests Health Maintenance  Topic Date Due  . INFLUENZA VACCINE  03/16/2018  . MAMMOGRAM  06/30/2018  . TETANUS/TDAP  09/08/2021  . COLONOSCOPY  12/29/2026  . DEXA SCAN  Completed  . Hepatitis C Screening  Completed  . PNA vac Low Risk Adult  Completed    Qualifies for Shingles Vaccine? ***  Cancer Screenings: Lung: Low Dose CT Chest recommended if Age 25-80 years, 30 pack-year currently smoking OR have quit w/in 15years. Patient {DOES NOT does:27190::"does not"} qualify. Breast: Up to date on Mammogram? {Yes/No:30480221}   Up to date of Bone Density/Dexa? {Yes/No:30480221} Colorectal: ***  Additional Screenings: *** Hepatitis C Screening:      Plan:   ***  I have personally reviewed and noted the following in the patient's chart:   . Medical and social history . Use of alcohol, tobacco or illicit drugs  . Current medications and supplements . Functional ability and status . Nutritional status . Physical activity . Advanced directives . List of other physicians . Hospitalizations, surgeries, and ER visits in previous 12 months . Vitals . Screenings to  include cognitive, depression, and falls . Referrals and  appointments  In addition, I have reviewed and discussed with patient certain preventive protocols, quality metrics, and best practice recommendations. A written personalized care plan for preventive services as well as general preventive health recommendations were provided to patient.     Shela Nevin, [place]   04/13/2018

## 2018-04-14 ENCOUNTER — Encounter (HOSPITAL_COMMUNITY)
Admission: RE | Admit: 2018-04-14 | Discharge: 2018-04-14 | Disposition: A | Discharge status: Home / Self Care | Payer: PPO | Source: Ambulatory Visit | Attending: Cardiology | Admitting: Cardiology

## 2018-04-19 ENCOUNTER — Other Ambulatory Visit: Payer: Self-pay | Admitting: *Deleted

## 2018-04-19 ENCOUNTER — Encounter: Payer: Self-pay | Admitting: Behavioral Health

## 2018-04-19 ENCOUNTER — Ambulatory Visit (INDEPENDENT_AMBULATORY_CARE_PROVIDER_SITE_OTHER): Payer: PPO | Admitting: Behavioral Health

## 2018-04-19 VITALS — BP 138/80 | HR 60 | Ht 62.0 in | Wt 145.4 lb

## 2018-04-19 DIAGNOSIS — H9190 Unspecified hearing loss, unspecified ear: Secondary | ICD-10-CM

## 2018-04-19 DIAGNOSIS — Z0000 Encounter for general adult medical examination without abnormal findings: Secondary | ICD-10-CM | POA: Diagnosis not present

## 2018-04-19 DIAGNOSIS — Z780 Asymptomatic menopausal state: Secondary | ICD-10-CM

## 2018-04-19 MED ORDER — NITROGLYCERIN 0.4 MG SL SUBL: 0.4000 mg | SUBLINGUAL_TABLET | SUBLINGUAL | 3 refills | Status: DC | PRN

## 2018-04-19 NOTE — Patient Instructions (Signed)
Please schedule your next medicare wellness visit with me in 1 yr.  Continue to eat heart healthy diet (full of fruits, vegetables, whole grains, lean protein, water--limit salt, fat, and sugar intake) and increase physical activity as tolerated.  I have ordered your bone density scan. Please schedule.  I have placed a referral to audiology for your hearing concerns. They should contact you to schedule.   Bring a copy of your living will and/or healthcare power of attorney to your next office visit.   Hannah Vasquez , Thank you for taking time to come for your Medicare Wellness Visit. I appreciate your ongoing commitment to your health goals. Please review the following plan we discussed and let me know if I can assist you in the future.   These are the goals we discussed: Goals    . Increase physical activity     When weather permits, I will resume walking 30-60 min 2-3 days per week.        This is a list of the screening recommended for you and due dates:  Health Maintenance  Topic Date Due  . Flu Shot  03/16/2018  . Mammogram  06/30/2018  . Tetanus Vaccine  09/08/2021  . Colon Cancer Screening  12/29/2026  . DEXA scan (bone density measurement)  Completed  .  Hepatitis C: One time screening is recommended by Center for Disease Control  (CDC) for  adults born from [birth year] through 1967.   Completed  . Pneumonia vaccines  Completed    Health Maintenance for Postmenopausal Women Menopause is a normal process in which your reproductive ability comes to an end. This process happens gradually over a span of months to years, usually between the ages of 45 and 12. Menopause is complete when you have missed 12 consecutive menstrual periods. It is important to talk with your health care provider about some of the most common conditions that affect postmenopausal women, such as heart disease, cancer, and bone loss (osteoporosis). Adopting a healthy lifestyle and getting preventive care can help to  promote your health and wellness. Those actions can also lower your chances of developing some of these common conditions. What should I know about menopause? During menopause, you may experience a number of symptoms, such as:  Moderate-to-severe hot flashes.  Night sweats.  Decrease in sex drive.  Mood swings.  Headaches.  Tiredness.  Irritability.  Memory problems.  Insomnia.  Choosing to treat or not to treat menopausal changes is an individual decision that you make with your health care provider. What should I know about hormone replacement therapy and supplements? Hormone therapy products are effective for treating symptoms that are associated with menopause, such as hot flashes and night sweats. Hormone replacement carries certain risks, especially as you become older. If you are thinking about using estrogen or estrogen with progestin treatments, discuss the benefits and risks with your health care provider. What should I know about heart disease and stroke? Heart disease, heart attack, and stroke become more likely as you age. This may be due, in part, to the hormonal changes that your body experiences during menopause. These can affect how your body processes dietary fats, triglycerides, and cholesterol. Heart attack and stroke are both medical emergencies. There are many things that you can do to help prevent heart disease and stroke:  Have your blood pressure checked at least every 1-2 years. High blood pressure causes heart disease and increases the risk of stroke.  If you are 64-79 years old,  ask your health care provider if you should take aspirin to prevent a heart attack or a stroke.  Do not use any tobacco products, including cigarettes, chewing tobacco, or electronic cigarettes. If you need help quitting, ask your health care provider.  It is important to eat a healthy diet and maintain a healthy weight. ? Be sure to include plenty of vegetables, fruits, low-fat  dairy products, and lean protein. ? Avoid eating foods that are high in solid fats, added sugars, or salt (sodium).  Get regular exercise. This is one of the most important things that you can do for your health. ? Try to exercise for at least 150 minutes each week. The type of exercise that you do should increase your heart rate and make you sweat. This is known as moderate-intensity exercise. ? Try to do strengthening exercises at least twice each week. Do these in addition to the moderate-intensity exercise.  Know your numbers.Ask your health care provider to check your cholesterol and your blood glucose. Continue to have your blood tested as directed by your health care provider.  What should I know about cancer screening? There are several types of cancer. Take the following steps to reduce your risk and to catch any cancer development as early as possible. Breast Cancer  Practice breast self-awareness. ? This means understanding how your breasts normally appear and feel. ? It also means doing regular breast self-exams. Let your health care provider know about any changes, no matter how small.  If you are 1 or older, have a clinician do a breast exam (clinical breast exam or CBE) every year. Depending on your age, family history, and medical history, it may be recommended that you also have a yearly breast X-ray (mammogram).  If you have a family history of breast cancer, talk with your health care provider about genetic screening.  If you are at high risk for breast cancer, talk with your health care provider about having an MRI and a mammogram every year.  Breast cancer (BRCA) gene test is recommended for women who have family members with BRCA-related cancers. Results of the assessment will determine the need for genetic counseling and BRCA1 and for BRCA2 testing. BRCA-related cancers include these types: ? Breast. This occurs in males or females. ? Ovarian. ? Tubal. This may also  be called fallopian tube cancer. ? Cancer of the abdominal or pelvic lining (peritoneal cancer). ? Prostate. ? Pancreatic.  Cervical, Uterine, and Ovarian Cancer Your health care provider may recommend that you be screened regularly for cancer of the pelvic organs. These include your ovaries, uterus, and vagina. This screening involves a pelvic exam, which includes checking for microscopic changes to the surface of your cervix (Pap test).  For women ages 21-65, health care providers may recommend a pelvic exam and a Pap test every three years. For women ages 94-65, they may recommend the Pap test and pelvic exam, combined with testing for human papilloma virus (HPV), every five years. Some types of HPV increase your risk of cervical cancer. Testing for HPV may also be done on women of any age who have unclear Pap test results.  Other health care providers may not recommend any screening for nonpregnant women who are considered low risk for pelvic cancer and have no symptoms. Ask your health care provider if a screening pelvic exam is right for you.  If you have had past treatment for cervical cancer or a condition that could lead to cancer, you need Pap  tests and screening for cancer for at least 20 years after your treatment. If Pap tests have been discontinued for you, your risk factors (such as having a new sexual partner) need to be reassessed to determine if you should start having screenings again. Some women have medical problems that increase the chance of getting cervical cancer. In these cases, your health care provider may recommend that you have screening and Pap tests more often.  If you have a family history of uterine cancer or ovarian cancer, talk with your health care provider about genetic screening.  If you have vaginal bleeding after reaching menopause, tell your health care provider.  There are currently no reliable tests available to screen for ovarian cancer.  Lung  Cancer Lung cancer screening is recommended for adults 62-91 years old who are at high risk for lung cancer because of a history of smoking. A yearly low-dose CT scan of the lungs is recommended if you:  Currently smoke.  Have a history of at least 30 pack-years of smoking and you currently smoke or have quit within the past 15 years. A pack-year is smoking an average of one pack of cigarettes per day for one year.  Yearly screening should:  Continue until it has been 15 years since you quit.  Stop if you develop a health problem that would prevent you from having lung cancer treatment.  Colorectal Cancer  This type of cancer can be detected and can often be prevented.  Routine colorectal cancer screening usually begins at age 74 and continues through age 69.  If you have risk factors for colon cancer, your health care provider may recommend that you be screened at an earlier age.  If you have a family history of colorectal cancer, talk with your health care provider about genetic screening.  Your health care provider may also recommend using home test kits to check for hidden blood in your stool.  A small camera at the end of a tube can be used to examine your colon directly (sigmoidoscopy or colonoscopy). This is done to check for the earliest forms of colorectal cancer.  Direct examination of the colon should be repeated every 5-10 years until age 76. However, if early forms of precancerous polyps or small growths are found or if you have a family history or genetic risk for colorectal cancer, you may need to be screened more often.  Skin Cancer  Check your skin from head to toe regularly.  Monitor any moles. Be sure to tell your health care provider: ? About any new moles or changes in moles, especially if there is a change in a mole's shape or color. ? If you have a mole that is larger than the size of a pencil eraser.  If any of your family members has a history of skin  cancer, especially at a young age, talk with your health care provider about genetic screening.  Always use sunscreen. Apply sunscreen liberally and repeatedly throughout the day.  Whenever you are outside, protect yourself by wearing long sleeves, pants, a wide-brimmed hat, and sunglasses.  What should I know about osteoporosis? Osteoporosis is a condition in which bone destruction happens more quickly than new bone creation. After menopause, you may be at an increased risk for osteoporosis. To help prevent osteoporosis or the bone fractures that can happen because of osteoporosis, the following is recommended:  If you are 32-11 years old, get at least 1,000 mg of calcium and at least 600 mg  of vitamin D per day.  If you are older than age 25 but younger than age 61, get at least 1,200 mg of calcium and at least 600 mg of vitamin D per day.  If you are older than age 73, get at least 1,200 mg of calcium and at least 800 mg of vitamin D per day.  Smoking and excessive alcohol intake increase the risk of osteoporosis. Eat foods that are rich in calcium and vitamin D, and do weight-bearing exercises several times each week as directed by your health care provider. What should I know about how menopause affects my mental health? Depression may occur at any age, but it is more common as you become older. Common symptoms of depression include:  Low or sad mood.  Changes in sleep patterns.  Changes in appetite or eating patterns.  Feeling an overall lack of motivation or enjoyment of activities that you previously enjoyed.  Frequent crying spells.  Talk with your health care provider if you think that you are experiencing depression. What should I know about immunizations? It is important that you get and maintain your immunizations. These include:  Tetanus, diphtheria, and pertussis (Tdap) booster vaccine.  Influenza every year before the flu season begins.  Pneumonia  vaccine.  Shingles vaccine.  Your health care provider may also recommend other immunizations. This information is not intended to replace advice given to you by your health care provider. Make sure you discuss any questions you have with your health care provider. Document Released: 09/24/2005 Document Revised: 02/20/2016 Document Reviewed: 05/06/2015 Elsevier Interactive Patient Education  2018 [employer].

## 2018-04-19 NOTE — Progress Notes (Signed)
I reviewed health advisor's note, was available for consultation, and agree with documentation and plan.  

## 2018-04-20 ENCOUNTER — Encounter: Payer: PPO | Admitting: Family Medicine

## 2018-04-21 ENCOUNTER — Encounter (HOSPITAL_COMMUNITY)
Admission: RE | Admit: 2018-04-21 | Discharge: 2018-04-21 | Disposition: A | Discharge status: Home / Self Care | Payer: Self-pay | Source: Ambulatory Visit | Attending: Cardiology | Admitting: Cardiology

## 2018-04-21 DIAGNOSIS — I2583 Coronary atherosclerosis due to lipid rich plaque: Secondary | ICD-10-CM | POA: Insufficient documentation

## 2018-04-21 DIAGNOSIS — R0609 Other forms of dyspnea: Secondary | ICD-10-CM | POA: Insufficient documentation

## 2018-04-21 DIAGNOSIS — I10 Essential (primary) hypertension: Secondary | ICD-10-CM | POA: Insufficient documentation

## 2018-04-21 DIAGNOSIS — R5383 Other fatigue: Secondary | ICD-10-CM | POA: Insufficient documentation

## 2018-04-21 DIAGNOSIS — I2510 Atherosclerotic heart disease of native coronary artery without angina pectoris: Secondary | ICD-10-CM | POA: Insufficient documentation

## 2018-04-24 ENCOUNTER — Encounter (HOSPITAL_COMMUNITY)
Admission: RE | Admit: 2018-04-24 | Discharge: 2018-04-24 | Disposition: A | Discharge status: Home / Self Care | Payer: Self-pay | Source: Ambulatory Visit | Attending: Nurse Practitioner | Admitting: Nurse Practitioner

## 2018-04-27 ENCOUNTER — Encounter: Payer: Self-pay | Admitting: Family Medicine

## 2018-04-27 ENCOUNTER — Ambulatory Visit (INDEPENDENT_AMBULATORY_CARE_PROVIDER_SITE_OTHER): Payer: PPO | Admitting: Family Medicine

## 2018-04-27 VITALS — BP 114/58 | HR 67 | Temp 98.4°F | Ht 61.91 in | Wt 145.6 lb

## 2018-04-27 DIAGNOSIS — M5416 Radiculopathy, lumbar region: Secondary | ICD-10-CM | POA: Diagnosis not present

## 2018-04-27 DIAGNOSIS — M949 Disorder of cartilage, unspecified: Secondary | ICD-10-CM | POA: Diagnosis not present

## 2018-04-27 DIAGNOSIS — M899 Disorder of bone, unspecified: Secondary | ICD-10-CM | POA: Diagnosis not present

## 2018-04-27 DIAGNOSIS — Z23 Encounter for immunization: Secondary | ICD-10-CM | POA: Diagnosis not present

## 2018-04-27 MED ORDER — GABAPENTIN 300 MG PO CAPS: 300.0000 mg | ORAL_CAPSULE | Freq: Every day | ORAL | 3 refills | Status: DC

## 2018-04-27 NOTE — Assessment & Plan Note (Addendum)
Deteriorated. Could not tolerate Lyrica. Start gabapentin 300 mg nightly. She will update me in a few weeks with her symptoms.

## 2018-04-27 NOTE — Progress Notes (Signed)
Subjective:   Patient ID: Hannah Vasquez, female    DOB: May 27, 1951, 67 y.o.   MRN: [id number]  Hannah Vasquez is a pleasant 67 y.o. year old female who presents to clinic today with Follow-up (Patient is here today to F/U after AWV with Glenard Haring.  She is not currently fasting today.  Mammogram is due in November at [place].  BMD was ordered on 9.4.19 and she said she will call [facility] to schedule that as well.  Colonoscopy is UTD and next due on 5.15.202.  She agrees to get the flu shot today.  )  on 04/27/2018  HPI:  Had annual wellness visit with Glenard Haring, RN on 04/19/18. Note reviewed.  She is having more back pain with right sided radiculopathy.  Lyrica did not help and had too many side effects. She is still going to cardiac rehab three times a week.  She really enjoys this but her right leg is bothering her more. Sometimes she has to take tramadol at night.   Current Outpatient Medications on File Prior to Visit  Medication Sig Dispense Refill  . albuterol (PROVENTIL HFA;VENTOLIN HFA) 108 (90 Base) MCG/ACT inhaler Inhale 1 puff into the lungs every 6 (six) hours as needed for wheezing or shortness of breath.    Marland Kitchen amLODipine (NORVASC) 5 MG tablet Take 1 tablet (5 mg total) by mouth daily. 90 tablet 3  . aspirin EC 81 MG tablet Take 81 mg by mouth every morning.     Marland Kitchen atorvastatin (LIPITOR) 80 MG tablet TAKE 1 TABLET BY MOUTH ONCE A DAY AT 6PM 30 tablet 12  . Cholecalciferol (VITAMIN D3) 1000 UNITS CAPS Take 1 capsule by mouth every morning.     . fluticasone (FLONASE) 50 MCG/ACT nasal spray Place into both nostrils daily.    . furosemide (LASIX) 20 MG tablet Take 1 tablet (20 mg total) by mouth daily as needed for edema. 90 tablet 3  . isosorbide mononitrate (IMDUR) 30 MG 24 hr tablet TAKE 1 TABLET BY MOUTH ONCE A DAY 90 tablet 3  . loratadine (CLARITIN) 10 MG tablet Take 1 tablet (10 mg total) by mouth every morning. 30 tablet 2  . losartan (COZAAR) 100 MG tablet Take 1 tablet (100 mg total) by  mouth daily. 90 tablet 1  . metoprolol succinate (TOPROL-XL) 25 MG 24 hr tablet Take 1 tablet (25 mg total) by mouth daily. 30 tablet 0  . Multiple Vitamin (MULTIVITAMIN) tablet Take 1 tablet by mouth every morning.     . nitroGLYCERIN (NITROSTAT) 0.4 MG SL tablet Place 1 tablet (0.4 mg total) under the tongue every 5 (five) minutes as needed for chest pain. 25 tablet 3  . Omega-3 Fatty Acids (FISH OIL) 1200 MG CAPS Take 1 capsule by mouth every morning.     Marland Kitchen omeprazole (PRILOSEC) 40 MG capsule Take 40 mg by mouth daily.     . potassium chloride (K-DUR) 10 MEQ tablet Take 10 mEq daily as needed by mouth. If taking Furosemide (Lasix)    . traMADol (ULTRAM) 50 MG tablet TAKE 1 TABLET BY MOUTH EVERY 8 HOURS AS NEEDED FOR PAIN 60 tablet 2   No current facility-administered medications on file prior to visit.     Allergies  Allergen Reactions  . Bactroban [Mupirocin Calcium]     Facial redness and swelling  . Codeine     REACTION: nausea and vomiting  . Penicillins Nausea And Vomiting    REACTION: rash  . Valtrex [Valacyclovir Hcl] Swelling  Per pt causes redness    Past Medical History:  Diagnosis Date  . Allergy   . Anxiety   . CAD (coronary artery disease)   . Cataract   . Hemorrhoids   . Hypertension   . Mitral valve prolapse   . Osteoporosis   . Status post dilation of esophageal narrowing     Past Surgical History:  Procedure Laterality Date  . BASAL CELL CARCINOMA EXCISION  02/05/2016   Dr. Sarajane Jews, [facility]  . BLADDER SURGERY  11/15/1995   bladder neck suspension, urinary incontinence  . CARDIAC CATHETERIZATION  01/03/2017 [facility]  . CATARACT EXTRACTION Left 04/30/2016   Dr. Tommy Rainwater, [place] N/A 01/03/2017   Procedure: Coronary Stent Intervention;  Surgeon: Nelva Bush, MD;  Location: [place] [facility];  Service: Cardiovascular;  Laterality: N/A;  . DENTAL SURGERY  06/20/2016   Tooth Implant; Dr. Kalman Shan  .  INTRAVASCULAR ULTRASOUND/IVUS N/A 01/03/2017   Procedure: Intravascular Ultrasound/IVUS;  Surgeon: Nelva Bush, MD;  Location: [place] [facility];  Service: Cardiovascular;  Laterality: N/A;  . KNEE SURGERY  2003   torn meniscus  . LEFT HEART CATH AND CORONARY ANGIOGRAPHY N/A 01/03/2017   Procedure: Left Heart Cath and Coronary Angiography;  Surgeon: Nelva Bush, MD;  Location: [place] [facility];  Service: Cardiovascular;  Laterality: N/A;  . MOHS SURGERY Right 2017   right side of face  . ROTATOR CUFF REPAIR    . SHOULDER SURGERY Right 2005    Family History  Problem Relation Age of Onset  . Mitral valve prolapse Mother   . Hypertension Father   . Brain cancer Father     Social History   Socioeconomic History  . Marital status: Divorced    Spouse name: Not on file  . Number of children: 2  . Years of education: Not on file  . Highest education level: Not on file  Occupational History  . 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 Financial resource strain: Not on file  . Food insecurity:    Worry: Not on file    Inability: Not on file  . Transportation needs:    Medical: Not on file    Non-medical: Not on file  Tobacco Use  . Smoking status: Never Smoker  . Smokeless tobacco: Never Used  Substance and Sexual Activity  . Alcohol use: No    Alcohol/week: 0.0 standard drinks  . Drug use: No  . Sexual activity: Not Currently  Lifestyle  . Physical activity:    Days per week: Not on file    Minutes per session: Not on file  . Stress: Not on file  Relationships  . Social connections:    Talks on phone: Not on file    Gets together: Not on file    Attends religious service: Not on file    Active member of club or organization: Not on file    Attends meetings of clubs or organizations: Not on file    Relationship status: Not on file  . Intimate partner violence:    Fear of current or ex partner: Not on file    Emotionally  abused: Not on file    Physically abused: Not on file    Forced sexual activity: Not on file  Other Topics Concern  . Not on file  Social History Narrative   Has a living will.   HPOA- daughter.   She would desire  CPR.  Would desire life support if necessary and not futile.         The PMH, PSH, Social History, Family History, Medications, and allergies have been reviewed in [facility], and have been updated if relevant.   Review of Systems  Constitutional: Negative.   HENT: Negative.   Respiratory: Negative.   Cardiovascular: Negative.   Musculoskeletal: Positive for back pain. Negative for arthralgias, gait problem, joint swelling, myalgias and neck pain.  Neurological: Positive for numbness.  Hematological: Negative.   Psychiatric/Behavioral: Negative.   All other systems reviewed and are negative.      Objective:    BP (!) 114/58 (BP Location: Left Arm, Patient Position: Sitting, Cuff Size: Normal)   Pulse 67   Temp 98.4 F (36.9 C) (Oral)   Ht 5' 1.91" (1.573 m)   Wt 145 lb 9.6 oz (66 kg)   LMP  (LMP Unknown)   SpO2 97%   BMI 26.71 kg/m    Physical Exam  Constitutional: She is oriented to person, place, and time. She appears well-developed and well-nourished. No distress.  HENT:  Head: Normocephalic and atraumatic.  Eyes: EOM are normal.  Neck: Normal range of motion.  Cardiovascular: Normal rate.  Pulmonary/Chest: Effort normal.  Musculoskeletal: Normal range of motion. She exhibits no edema.  Neurological: She is alert and oriented to person, place, and time.  Skin: Skin is warm and dry. She is not diaphoretic.  Psychiatric: She has a normal mood and affect. Her behavior is normal. Judgment and thought content normal.  Nursing note and vitals reviewed.         Assessment & Plan:   Need for influenza vaccination - Plan: Flu Vaccine QUAD 6+ mos PF IM (Fluarix Quad PF)  Lumbar radiculopathy No follow-ups on file.

## 2018-04-27 NOTE — Patient Instructions (Addendum)
Great to see you. Please call [facility] to schedule your bone density scan.  We are starting gabapentin 300 mg nightly.  Call me in a few weeks with an update about your back and leg pain.  Schedule a physical in December or January.

## 2018-04-27 NOTE — Assessment & Plan Note (Signed)
Due for DEXA-  Order entered. Continue calcium, vitamin D and weight bearing exercise.

## 2018-05-01 ENCOUNTER — Encounter (HOSPITAL_COMMUNITY)
Admission: RE | Admit: 2018-05-01 | Discharge: 2018-05-01 | Disposition: A | Discharge status: Home / Self Care | Payer: Self-pay | Source: Ambulatory Visit | Attending: Cardiology | Admitting: Cardiology

## 2018-05-05 ENCOUNTER — Telehealth: Payer: Self-pay

## 2018-05-05 DIAGNOSIS — M8588 Other specified disorders of bone density and structure, other site: Secondary | ICD-10-CM | POA: Diagnosis not present

## 2018-05-05 DIAGNOSIS — M810 Age-related osteoporosis without current pathological fracture: Secondary | ICD-10-CM | POA: Diagnosis not present

## 2018-05-05 NOTE — Telephone Encounter (Signed)
TA-Had BMD scan today/Scheduled CPE in Dec/Update on Gabapentin 314m/is doing good, I stay a bit drowsy, take at 9pm and works till about 3pm then the burning and stuff comes back/she is wondering if she should take another one during the day?   Is aware that Dr. ADeborra Medinawon't be in until Monday and would like to wait for her to look at this and will wait for her response/thx dmf

## 2018-05-05 NOTE — Telephone Encounter (Signed)
Copied from [place] [id number] [phone]. Topic: General - Other >> May 05, 2018 11:45 AM Leward Quan A wrote: Reason for CRM: Patient called request a call back from [facility] Dr Hulen Shouts nurse. She stated that she have some information for the doctor and request a call back today please.

## 2018-05-08 ENCOUNTER — Encounter (HOSPITAL_COMMUNITY)
Admission: RE | Admit: 2018-05-08 | Discharge: 2018-05-08 | Disposition: A | Discharge status: Home / Self Care | Payer: Self-pay | Source: Ambulatory Visit | Attending: Cardiology | Admitting: Cardiology

## 2018-05-08 NOTE — Telephone Encounter (Signed)
How drowsy does it make her?  Would she comfortable taking another 300 mg mid day or should we try 100 mg capsule mid day instead if she is concerned about sedation?

## 2018-05-10 ENCOUNTER — Encounter (HOSPITAL_COMMUNITY)
Admission: RE | Admit: 2018-05-10 | Discharge: 2018-05-10 | Disposition: A | Discharge status: Home / Self Care | Payer: Self-pay | Source: Ambulatory Visit | Attending: Cardiology | Admitting: Cardiology

## 2018-05-10 MED ORDER — GABAPENTIN 100 MG PO CAPS: ORAL_CAPSULE | ORAL | 0 refills | Status: DC

## 2018-05-10 NOTE — Telephone Encounter (Signed)
Pt agreed to try 112m mid-day and continue 301mhs/sent to pharmacy/thx dmf

## 2018-05-11 ENCOUNTER — Telehealth: Payer: Self-pay

## 2018-05-11 NOTE — Telephone Encounter (Signed)
PEC-I LMOVM stating that we received her Bone Density results and Dr. Deborra Medina wanted to see her to discuss so I took the liberty of scheduling an apt on 10.09.19 @ 11:45 and to please call back if this needs to be changed  If pt calls back you may advise her that bone density shows she has Osteoporosis and the visit is to discuss treatment/if appt needs to be changed to a later date then please be certain it remains at 20 minutes/thx dmf

## 2018-05-12 ENCOUNTER — Encounter (HOSPITAL_COMMUNITY)
Admission: RE | Admit: 2018-05-12 | Discharge: 2018-05-12 | Disposition: A | Discharge status: Home / Self Care | Payer: Self-pay | Source: Ambulatory Visit | Attending: Cardiology | Admitting: Cardiology

## 2018-05-12 NOTE — Telephone Encounter (Signed)
Appointment rescheduled but patient is asking for a call back from Hannah Vasquez to see if Dr Deborra Medina needs to see her sooner

## 2018-05-12 NOTE — Telephone Encounter (Signed)
Left a VM for pt informing her that her rescheduled appt will be ok. 10.14.19 at 845

## 2018-05-15 ENCOUNTER — Encounter: Payer: Self-pay | Admitting: Family Medicine

## 2018-05-15 ENCOUNTER — Ambulatory Visit (INDEPENDENT_AMBULATORY_CARE_PROVIDER_SITE_OTHER): Payer: PPO

## 2018-05-15 ENCOUNTER — Ambulatory Visit (INDEPENDENT_AMBULATORY_CARE_PROVIDER_SITE_OTHER): Payer: PPO | Admitting: Family Medicine

## 2018-05-15 VITALS — BP 138/62 | HR 61 | Temp 98.6°F | Ht 61.91 in | Wt 147.2 lb

## 2018-05-15 DIAGNOSIS — M545 Low back pain, unspecified: Secondary | ICD-10-CM

## 2018-05-15 DIAGNOSIS — M5416 Radiculopathy, lumbar region: Secondary | ICD-10-CM | POA: Diagnosis not present

## 2018-05-15 DIAGNOSIS — M810 Age-related osteoporosis without current pathological fracture: Secondary | ICD-10-CM

## 2018-05-15 DIAGNOSIS — M8580 Other specified disorders of bone density and structure, unspecified site: Secondary | ICD-10-CM | POA: Insufficient documentation

## 2018-05-15 NOTE — Assessment & Plan Note (Signed)
Improved with Gabapentin.

## 2018-05-15 NOTE — Patient Instructions (Signed)
Great to see you.  We will call you with your xray results.

## 2018-05-15 NOTE — Assessment & Plan Note (Signed)
Different than previous pain and now with new diagnosis of osteoporosis, lumbar xray warranted to rule out compression fx. Continue gabapentin at current dose. The patient indicates understanding of these issues and agrees with the plan.

## 2018-05-15 NOTE — Assessment & Plan Note (Signed)
New- await xray results prior to discussing tx options. She is taking calcium vit d and does weight bearing exercises.

## 2018-05-15 NOTE — Progress Notes (Signed)
Subjective:   Patient ID: Hannah Vasquez, female    DOB: February 11, 1951, 67 y.o.   MRN: [id number]  Hannah Vasquez is a pleasant 67 y.o. year old female who presents to clinic today with Back Pain (3-4 days of pain in lower back, turning makes it hurt worse. No changes in urination.)  on 05/15/2018  HPI: Back pain- different than the back pain I saw her for on 04/27/18. Note reviewed from that [place].  At that [place], she complained of the following-  She is having more back pain with right sided radiculopathy.  Lyrica did not help and had too many side effects. She is still going to cardiac rehab three times a week.  She really enjoys this but her right leg is bothering her more. Sometimes she has to take tramadol at night.  Gabapentin 100 mg every afternoon and 300 mg at bedtime has helped tremendously with her leg pain.  It is gone.  Now she has had acute onset of lower back pain, very sharp when she turns for 3-4 days.  No radiculopathy.  No urinary symptoms.  No known injury.   Current Outpatient Medications on File Prior to Visit  Medication Sig Dispense Refill  . albuterol (PROVENTIL HFA;VENTOLIN HFA) 108 (90 Base) MCG/ACT inhaler Inhale 1 puff into the lungs every 6 (six) hours as needed for wheezing or shortness of breath.    Marland Kitchen amLODipine (NORVASC) 5 MG tablet Take 1 tablet (5 mg total) by mouth daily. 90 tablet 3  . aspirin EC 81 MG tablet Take 81 mg by mouth every morning.     Marland Kitchen atorvastatin (LIPITOR) 80 MG tablet TAKE 1 TABLET BY MOUTH ONCE A DAY AT 6PM 30 tablet 12  . Cholecalciferol (VITAMIN D3) 1000 UNITS CAPS Take 1 capsule by mouth every morning.     . fluticasone (FLONASE) 50 MCG/ACT nasal spray Place into both nostrils daily.    . furosemide (LASIX) 20 MG tablet Take 1 tablet (20 mg total) by mouth daily as needed for edema. 90 tablet 3  . gabapentin (NEURONTIN) 100 MG capsule Take 1 capsule mid-day (still take the 372m at hs) 90 capsule 0  . gabapentin (NEURONTIN) 300 MG capsule  Take 1 capsule (300 mg total) by mouth at bedtime. 30 capsule 3  . isosorbide mononitrate (IMDUR) 30 MG 24 hr tablet TAKE 1 TABLET BY MOUTH ONCE A DAY 90 tablet 3  . loratadine (CLARITIN) 10 MG tablet Take 1 tablet (10 mg total) by mouth every morning. 30 tablet 2  . losartan (COZAAR) 100 MG tablet Take 1 tablet (100 mg total) by mouth daily. 90 tablet 1  . metoprolol succinate (TOPROL-XL) 25 MG 24 hr tablet Take 1 tablet (25 mg total) by mouth daily. 30 tablet 0  . Multiple Vitamin (MULTIVITAMIN) tablet Take 1 tablet by mouth every morning.     . nitroGLYCERIN (NITROSTAT) 0.4 MG SL tablet Place 1 tablet (0.4 mg total) under the tongue every 5 (five) minutes as needed for chest pain. 25 tablet 3  . Omega-3 Fatty Acids (FISH OIL) 1200 MG CAPS Take 1 capsule by mouth every morning.     .Marland Kitchenomeprazole (PRILOSEC) 40 MG capsule Take 40 mg by mouth daily.     . potassium chloride (K-DUR) 10 MEQ tablet Take 10 mEq daily as needed by mouth. If taking Furosemide (Lasix)    . traMADol (ULTRAM) 50 MG tablet TAKE 1 TABLET BY MOUTH EVERY 8 HOURS AS NEEDED FOR PAIN 60 tablet  2   No current facility-administered medications on file prior to visit.     Allergies  Allergen Reactions  . Bactroban [Mupirocin Calcium]     Facial redness and swelling  . Codeine     REACTION: nausea and vomiting  . Penicillins Nausea And Vomiting    REACTION: rash  . Valtrex [Valacyclovir Hcl] Swelling    Per pt causes redness    Past Medical History:  Diagnosis Date  . Allergy   . Anxiety   . CAD (coronary artery disease)   . Cataract   . Hemorrhoids   . Hypertension   . Mitral valve prolapse   . Osteoporosis   . Status post dilation of esophageal narrowing     Past Surgical History:  Procedure Laterality Date  . BASAL CELL CARCINOMA EXCISION  02/05/2016   Dr. Sarajane Jews, [facility]  . BLADDER SURGERY  11/15/1995   bladder neck suspension, urinary incontinence  . CARDIAC CATHETERIZATION  01/03/2017  DES LAD  . CATARACT EXTRACTION Left 04/30/2016   Dr. Tommy Rainwater, [place] N/A 01/03/2017   Procedure: Coronary Stent Intervention;  Surgeon: Nelva Bush, MD;  Location: [place] [facility];  Service: Cardiovascular;  Laterality: N/A;  . DENTAL SURGERY  06/20/2016   Tooth Implant; Dr. Kalman Shan  . INTRAVASCULAR ULTRASOUND/IVUS N/A 01/03/2017   Procedure: Intravascular Ultrasound/IVUS;  Surgeon: Nelva Bush, MD;  Location: [place] [facility];  Service: Cardiovascular;  Laterality: N/A;  . KNEE SURGERY  2003   torn meniscus  . LEFT HEART CATH AND CORONARY ANGIOGRAPHY N/A 01/03/2017   Procedure: Left Heart Cath and Coronary Angiography;  Surgeon: Nelva Bush, MD;  Location: [place] [facility];  Service: Cardiovascular;  Laterality: N/A;  . MOHS SURGERY Right 2017   right side of face  . ROTATOR CUFF REPAIR    . SHOULDER SURGERY Right 2005    Family History  Problem Relation Age of Onset  . Mitral valve prolapse Mother   . Hypertension Father   . Brain cancer Father     Social History   Socioeconomic History  . Marital status: Divorced    Spouse name: Not on file  . Number of children: 2  . Years of education: Not on file  . Highest education level: Not on file  Occupational History  . 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 Financial resource strain: Not on file  . Food insecurity:    Worry: Not on file    Inability: Not on file  . Transportation needs:    Medical: Not on file    Non-medical: Not on file  Tobacco Use  . Smoking status: Never Smoker  . Smokeless tobacco: Never Used  Substance and Sexual Activity  . Alcohol use: No    Alcohol/week: 0.0 standard drinks  . Drug use: No  . Sexual activity: Not Currently  Lifestyle  . Physical activity:    Days per week: Not on file    Minutes per session: Not on file  . Stress: Not on file  Relationships  . Social connections:    Talks on  phone: Not on file    Gets together: Not on file    Attends religious service: Not on file    Active member of club or organization: Not on file    Attends meetings of clubs or organizations: Not on file    Relationship status: Not on file  . Intimate partner violence:  Fear of current or ex partner: Not on file    Emotionally abused: Not on file    Physically abused: Not on file    Forced sexual activity: Not on file  Other Topics Concern  . Not on file  Social History Narrative   Has a living will.   HPOA- daughter.   She would desire CPR.  Would desire life support if necessary and not futile.         The PMH, PSH, Social History, Family History, Medications, and allergies have been reviewed in [facility], and have been updated if relevant.   Review of Systems  Gastrointestinal: Negative.   Genitourinary: Negative.   Musculoskeletal: Positive for back pain. Negative for arthralgias, gait problem, joint swelling, myalgias, neck pain and neck stiffness.  Neurological: Negative.   All other systems reviewed and are negative.      Objective:    BP 138/62 (BP Location: Left Arm, Patient Position: Sitting, Cuff Size: Normal)   Pulse 61   Temp 98.6 F (37 C) (Oral)   Ht 5' 1.91" (1.573 m)   Wt 147 lb 3.2 oz (66.8 kg)   LMP  (LMP Unknown)   SpO2 97%   BMI 27.00 kg/m    Physical Exam  Constitutional: She is oriented to person, place, and time. She appears well-developed and well-nourished. No distress.  HENT:  Head: Normocephalic and atraumatic.  Cardiovascular: Normal rate.  Pulmonary/Chest: Effort normal.  Musculoskeletal:       Lumbar back: She exhibits decreased range of motion and tenderness.  Neg SLR  Neurological: She is alert and oriented to person, place, and time.  Skin: Skin is warm and dry. She is not diaphoretic.  Psychiatric: She has a normal mood and affect. Her behavior is normal. Judgment and thought content normal.  Nursing note and vitals  reviewed.         Assessment & Plan:   Midline low back pain without sciatica, unspecified chronicity - Plan: DG Lumbar Spine Complete  Osteoporosis, unspecified osteoporosis type, unspecified pathological fracture presence  Lumbar radiculopathy No follow-ups on file.

## 2018-05-17 ENCOUNTER — Encounter (HOSPITAL_COMMUNITY)
Admission: RE | Admit: 2018-05-17 | Discharge: 2018-05-17 | Disposition: A | Discharge status: Home / Self Care | Payer: Self-pay | Source: Ambulatory Visit | Attending: Cardiology | Admitting: Cardiology

## 2018-05-17 DIAGNOSIS — I2583 Coronary atherosclerosis due to lipid rich plaque: Secondary | ICD-10-CM | POA: Insufficient documentation

## 2018-05-17 DIAGNOSIS — R5383 Other fatigue: Secondary | ICD-10-CM | POA: Insufficient documentation

## 2018-05-17 DIAGNOSIS — I10 Essential (primary) hypertension: Secondary | ICD-10-CM | POA: Insufficient documentation

## 2018-05-17 DIAGNOSIS — R0609 Other forms of dyspnea: Secondary | ICD-10-CM | POA: Insufficient documentation

## 2018-05-17 DIAGNOSIS — I2510 Atherosclerotic heart disease of native coronary artery without angina pectoris: Secondary | ICD-10-CM | POA: Insufficient documentation

## 2018-05-18 ENCOUNTER — Telehealth: Payer: Self-pay | Admitting: Family Medicine

## 2018-05-18 NOTE — Telephone Encounter (Signed)
Spoke with patient about her test results. Will add to pt chart.

## 2018-05-18 NOTE — Telephone Encounter (Signed)
Copied from [place] [id number]. Topic: Inquiry >> May 18, 2018  9:47 AM [name] wrote: Reason for CRM: pt called to update pcp about her calcium intake; pt is taking calcium 125m + 287m Vit D3; 2x daily; contact pt to advise

## 2018-05-19 ENCOUNTER — Encounter (HOSPITAL_COMMUNITY)
Admission: RE | Admit: 2018-05-19 | Discharge: 2018-05-19 | Disposition: A | Discharge status: Home / Self Care | Payer: Self-pay | Source: Ambulatory Visit | Attending: Cardiology | Admitting: Cardiology

## 2018-05-19 ENCOUNTER — Other Ambulatory Visit: Payer: Self-pay | Admitting: Family Medicine

## 2018-05-22 ENCOUNTER — Encounter (HOSPITAL_COMMUNITY): Payer: Self-pay

## 2018-05-22 ENCOUNTER — Encounter

## 2018-05-22 ENCOUNTER — Ambulatory Visit (INDEPENDENT_AMBULATORY_CARE_PROVIDER_SITE_OTHER): Payer: PPO | Admitting: Family Medicine

## 2018-05-22 VITALS — BP 138/68 | HR 59 | Temp 97.8°F | Ht 61.91 in | Wt 147.4 lb

## 2018-05-22 DIAGNOSIS — M5416 Radiculopathy, lumbar region: Secondary | ICD-10-CM | POA: Diagnosis not present

## 2018-05-22 MED ORDER — ACETAMINOPHEN 500 MG PO TABS: 500.0000 mg | ORAL_TABLET | Freq: Two times a day (BID) | ORAL | 0 refills | Status: DC

## 2018-05-22 MED ORDER — TRAMADOL HCL 50 MG PO TABS: 50.0000 mg | ORAL_TABLET | Freq: Four times a day (QID) | ORAL | 0 refills | Status: DC

## 2018-05-22 NOTE — Patient Instructions (Addendum)
Great to see you.  Let's start Tylenol 500 mg twice daily, I would go ahead and schedule that. Go ahead and increase Tramadol to 50 mg up to 4 times daily as needed.

## 2018-05-22 NOTE — Progress Notes (Signed)
Subjective:   Patient ID: Hannah Vasquez, female    DOB: 1950/10/19, 67 y.o.   MRN: [id number]  Hannah Vasquez is a pleasant 67 y.o. year old female who presents to clinic today with Back Pain (was seen 9.30.19 for midline LBP with sciatica, osteoporosis adn lumbar radiculopathy. L-spine shower DDD. IS seeing Dr Ermalene Postin (?) on Tuesday)  on 05/22/2018  HPI:  DDD with radiculopathy- seeing Dr. Ermalene Postin on Tuesday.  She has received injections in past from him in her back. Radiculopathy resolved with gabapentin- taking 300 mg qhs and 100 mg in the afternoon. Recently started taking Tramadol 50 mg twice daily because of increased pain.  Xray of lumbar spine done on 05/15/18 and showed the following:  [place] 4+ VIEW  COMPARISON:  Lumbar spine MRI 10/25/2015  FINDINGS: Five non-rib-bearing lumbar vertebra.  Minimal broad-based dextroconvex thoracolumbar scoliosis.  Disc space narrowing L3-L4 through L5-S1 with vacuum phenomenon at L4-L5 and L5-S1.  Scattered mild endplate spur formation inferior thoracic and inferior lumbar spine.  Vertebral body heights maintained.  No fracture, subluxation, or bone destruction.  Mild facet degenerative changes lower lumbar spine.  No spondylolysis.  IMPRESSION: Degenerative disc facet disease changes of the lumbar spine as above.  No acute abnormalities.   Current Outpatient Medications on File Prior to Visit  Medication Sig Dispense Refill  . albuterol (PROVENTIL HFA;VENTOLIN HFA) 108 (90 Base) MCG/ACT inhaler Inhale 1 puff into the lungs every 6 (six) hours as needed for wheezing or shortness of breath.    Marland Kitchen amLODipine (NORVASC) 5 MG tablet Take 1 tablet (5 mg total) by mouth daily. 90 tablet 3  . aspirin EC 81 MG tablet Take 81 mg by mouth every morning.     Marland Kitchen atorvastatin (LIPITOR) 80 MG tablet TAKE 1 TABLET BY MOUTH ONCE A DAY AT 6PM 30 tablet 12  . Cholecalciferol (VITAMIN D3) 1000 UNITS CAPS Take 1  capsule by mouth every morning.     . fluticasone (FLONASE) 50 MCG/ACT nasal spray Place into both nostrils daily.    . furosemide (LASIX) 20 MG tablet Take 1 tablet (20 mg total) by mouth daily as needed for edema. 90 tablet 3  . gabapentin (NEURONTIN) 100 MG capsule Take 1 capsule mid-day (still take the 364m at hs) 90 capsule 0  . gabapentin (NEURONTIN) 300 MG capsule Take 1 capsule (300 mg total) by mouth at bedtime. 30 capsule 3  . isosorbide mononitrate (IMDUR) 30 MG 24 hr tablet TAKE 1 TABLET BY MOUTH ONCE A DAY 90 tablet 3  . loratadine (CLARITIN) 10 MG tablet Take 1 tablet (10 mg total) by mouth every morning. 30 tablet 2  . losartan (COZAAR) 100 MG tablet Take 1 tablet (100 mg total) by mouth daily. 90 tablet 1  . metoprolol succinate (TOPROL-XL) 25 MG 24 hr tablet Take 1 tablet (25 mg total) by mouth daily. 30 tablet 0  . Multiple Vitamin (MULTIVITAMIN) tablet Take 1 tablet by mouth every morning.     . nitroGLYCERIN (NITROSTAT) 0.4 MG SL tablet Place 1 tablet (0.4 mg total) under the tongue every 5 (five) minutes as needed for chest pain. 25 tablet 3  . Omega-3 Fatty Acids (FISH OIL) 1200 MG CAPS Take 1 capsule by mouth every morning.     .Marland Kitchenomeprazole (PRILOSEC) 40 MG capsule Take 40 mg by mouth daily.     . potassium chloride (K-DUR) 10 MEQ tablet Take 10 mEq daily as needed by mouth. If taking Furosemide (  Lasix)     No current facility-administered medications on file prior to visit.     Allergies  Allergen Reactions  . Bactroban [Mupirocin Calcium]     Facial redness and swelling  . Codeine     REACTION: nausea and vomiting  . Penicillins Nausea And Vomiting    REACTION: rash  . Valtrex [Valacyclovir Hcl] Swelling    Per pt causes redness    Past Medical History:  Diagnosis Date  . Allergy   . Anxiety   . CAD (coronary artery disease)   . Cataract   . Hemorrhoids   . Hypertension   . Mitral valve prolapse   . Osteoporosis   . Status post dilation of esophageal  narrowing     Past Surgical History:  Procedure Laterality Date  . BASAL CELL CARCINOMA EXCISION  02/05/2016   Dr. Sarajane Jews, [facility]  . BLADDER SURGERY  11/15/1995   bladder neck suspension, urinary incontinence  . CARDIAC CATHETERIZATION  01/03/2017 DES LAD  . CATARACT EXTRACTION Left 04/30/2016   Dr. Tommy Rainwater, [place] N/A 01/03/2017   Procedure: Coronary Stent Intervention;  Surgeon: Nelva Bush, MD;  Location: [place] [facility];  Service: Cardiovascular;  Laterality: N/A;  . DENTAL SURGERY  06/20/2016   Tooth Implant; Dr. Kalman Shan  . INTRAVASCULAR ULTRASOUND/IVUS N/A 01/03/2017   Procedure: Intravascular Ultrasound/IVUS;  Surgeon: Nelva Bush, MD;  Location: [place] [facility];  Service: Cardiovascular;  Laterality: N/A;  . KNEE SURGERY  2003   torn meniscus  . LEFT HEART CATH AND CORONARY ANGIOGRAPHY N/A 01/03/2017   Procedure: Left Heart Cath and Coronary Angiography;  Surgeon: Nelva Bush, MD;  Location: [place] [facility];  Service: Cardiovascular;  Laterality: N/A;  . MOHS SURGERY Right 2017   right side of face  . ROTATOR CUFF REPAIR    . SHOULDER SURGERY Right 2005    Family History  Problem Relation Age of Onset  . Mitral valve prolapse Mother   . Hypertension Father   . Brain cancer Father     Social History   Socioeconomic History  . Marital status: Divorced    Spouse name: Not on file  . Number of children: 2  . Years of education: Not on file  . Highest education level: Not on file  Occupational History  . 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 Financial resource strain: Not on file  . Food insecurity:    Worry: Not on file    Inability: Not on file  . Transportation needs:    Medical: Not on file    Non-medical: Not on file  Tobacco Use  . Smoking status: Never Smoker  . Smokeless tobacco: Never Used  Substance and Sexual Activity  . Alcohol use:  No    Alcohol/week: 0.0 standard drinks  . Drug use: No  . Sexual activity: Not Currently  Lifestyle  . Physical activity:    Days per week: Not on file    Minutes per session: Not on file  . Stress: Not on file  Relationships  . Social connections:    Talks on phone: Not on file    Gets together: Not on file    Attends religious service: Not on file    Active member of club or organization: Not on file    Attends meetings of clubs or organizations: Not on file    Relationship status: Not on file  . Intimate partner  violence:    Fear of current or ex partner: Not on file    Emotionally abused: Not on file    Physically abused: Not on file    Forced sexual activity: Not on file  Other Topics Concern  . Not on file  Social History Narrative   Has a living will.   HPOA- daughter.   She would desire CPR.  Would desire life support if necessary and not futile.         The PMH, PSH, Social History, Family History, Medications, and allergies have been reviewed in [facility], and have been updated if relevant.  Review of Systems  Genitourinary: Negative.   Musculoskeletal: Positive for back pain. Negative for gait problem, joint swelling, myalgias and neck pain.  Neurological: Positive for numbness. Negative for tremors, syncope and weakness.  All other systems reviewed and are negative.      Objective:    BP 138/68 (BP Location: Left Arm, Patient Position: Sitting, Cuff Size: Normal)   Pulse (!) 59   Temp 97.8 F (36.6 C) (Oral)   Ht 5' 1.91" (1.573 m)   Wt 147 lb 6.4 oz (66.9 kg)   LMP  (LMP Unknown)   SpO2 98%   BMI 27.04 kg/m    Physical Exam  Constitutional: She is oriented to person, place, and time. She appears well-developed and well-nourished. No distress.  HENT:  Head: Normocephalic and atraumatic.  Eyes: EOM are normal.  Cardiovascular: Normal rate.  Pulmonary/Chest: Effort normal.  Neurological: She is alert and oriented to person, place, and time.  Skin:  Skin is warm and dry. She is not diaphoretic.  Psychiatric: She has a normal mood and affect. Her behavior is normal. Thought content normal.  Nursing note and vitals reviewed.         Assessment & Plan:   Lumbar back pain with radiculopathy affecting lower extremity No follow-ups on file.

## 2018-05-22 NOTE — Assessment & Plan Note (Signed)
>  15 minutes spent in face to face time with patient, >50% spent in counselling or coordination of care. Advised to continue current dose of gabapentin (she does not want to increase this due to dizziness), start taking tylenol ES - 1 tab po twice daily and to increase tramadol to 50 mg up to four times daily as needed for severe pain. Keep appointment with spine specialist. Follow up with me 2 weeks after seeing him. The patient indicates understanding of these issues and agrees with the plan.

## 2018-05-22 NOTE — Telephone Encounter (Signed)
TA-Last seen on 9.30.19 for sciatica/plz advise/thx dmf

## 2018-05-24 ENCOUNTER — Telehealth: Payer: Self-pay | Admitting: Family Medicine

## 2018-05-24 ENCOUNTER — Ambulatory Visit: Payer: PPO | Admitting: Family Medicine

## 2018-05-24 ENCOUNTER — Encounter (HOSPITAL_COMMUNITY): Payer: Self-pay

## 2018-05-24 MED ORDER — OXYCODONE HCL 5 MG PO TABS: 5.0000 mg | ORAL_TABLET | ORAL | 0 refills | Status: DC | PRN

## 2018-05-24 NOTE — Telephone Encounter (Signed)
Yes of course.  Oxycodone 5 mg tablet sent to pharmacy on file- she can take up to tone tablet every 4 hours as needed for pain.  She may continue tylenol as scheduled but hold off on taking Tramadol while taking the oxycodone.

## 2018-05-24 NOTE — Telephone Encounter (Signed)
TA-Pt called stating that the current medication regimen for pain is not working and she is needing something a bit stronger/she has an appt set up with Ortho on 10.15.19/plz advise/thx dmf

## 2018-05-24 NOTE — Telephone Encounter (Signed)
Copied from [place] [id number]. Topic: General - Other >> May 24, 2018  9:25 AM Lennox Solders wrote: Reason for CRM: pt saw dr Deborra Medina on 05-22-18 for back issues. Pt is calling the tramadol and aleve is not working. Pt would like something else sent to [facility] in [place] on [address]. Pt has an appt with orthopedic doctor on 05/30/18

## 2018-05-25 NOTE — Telephone Encounter (Signed)
LMOVM stating that Rx was sent in to pharmacy yesterday and not to take Tramadol with it/thx dmf

## 2018-05-25 NOTE — Telephone Encounter (Signed)
Patient wants to confirm she understands the instructions. She says thank you.

## 2018-05-26 ENCOUNTER — Encounter (HOSPITAL_COMMUNITY): Payer: Self-pay

## 2018-05-26 ENCOUNTER — Encounter: Payer: PPO | Admitting: Internal Medicine

## 2018-05-26 ENCOUNTER — Telehealth: Payer: Self-pay | Admitting: Family Medicine

## 2018-05-26 NOTE — Telephone Encounter (Signed)
Patient is aware 

## 2018-05-26 NOTE — Telephone Encounter (Signed)
Yes okay to continue both but they can both cause sedation so use cautiously and don't drive after taking oxycyodone.

## 2018-05-26 NOTE — Telephone Encounter (Signed)
Copied from [place] [id number]. Topic: Quick Communication - Rx Refill/Question >> May 26, 2018  2:52 PM Burchel, Abbi R wrote: Pt would like to know if it okay to continue taking her Gabapentin with the new rx (Oxycodone).  Please call pt to advise.  [phone]

## 2018-05-26 NOTE — Telephone Encounter (Signed)
See other telephone note.  

## 2018-05-29 ENCOUNTER — Ambulatory Visit: Payer: PPO | Admitting: Family Medicine

## 2018-05-29 ENCOUNTER — Encounter (HOSPITAL_COMMUNITY): Payer: Self-pay

## 2018-05-30 ENCOUNTER — Other Ambulatory Visit: Payer: Self-pay | Admitting: Physical Medicine and Rehabilitation

## 2018-05-30 DIAGNOSIS — M545 Low back pain, unspecified: Secondary | ICD-10-CM

## 2018-05-30 DIAGNOSIS — M47816 Spondylosis without myelopathy or radiculopathy, lumbar region: Secondary | ICD-10-CM | POA: Diagnosis not present

## 2018-05-31 ENCOUNTER — Encounter (HOSPITAL_COMMUNITY)
Admission: RE | Admit: 2018-05-31 | Discharge: 2018-05-31 | Disposition: A | Discharge status: Home / Self Care | Payer: Self-pay | Source: Ambulatory Visit | Attending: Cardiology | Admitting: Cardiology

## 2018-06-01 ENCOUNTER — Telehealth: Payer: Self-pay

## 2018-06-01 NOTE — Telephone Encounter (Signed)
Copied from [place] [id number]. Topic: General - Other >> Jun 01, 2018  3:16 PM Leward Quan A wrote: Reason for CRM: Patient called to get clarification on an appointment that she is scheduled for on 06/06/18. Stated that on her last visit with Dr Deborra Medina she want her to come back after the MRI which is scheduled on 06/12/18. So patient request a call back to cancel her upcoming appointment if she need to be seen after MRI results are back. Ph# [phone]

## 2018-06-02 ENCOUNTER — Encounter (HOSPITAL_COMMUNITY)
Admission: RE | Admit: 2018-06-02 | Discharge: 2018-06-02 | Disposition: A | Discharge status: Home / Self Care | Payer: Self-pay | Source: Ambulatory Visit | Attending: Cardiology | Admitting: Cardiology

## 2018-06-05 ENCOUNTER — Encounter (HOSPITAL_COMMUNITY)
Admission: RE | Admit: 2018-06-05 | Discharge: 2018-06-05 | Disposition: A | Discharge status: Home / Self Care | Payer: Self-pay | Source: Ambulatory Visit | Attending: Cardiology | Admitting: Cardiology

## 2018-06-06 ENCOUNTER — Encounter: Payer: Self-pay | Admitting: Family Medicine

## 2018-06-06 ENCOUNTER — Ambulatory Visit (INDEPENDENT_AMBULATORY_CARE_PROVIDER_SITE_OTHER): Payer: PPO | Admitting: Family Medicine

## 2018-06-06 VITALS — BP 134/84 | HR 53 | Temp 98.5°F | Ht 61.91 in | Wt 148.2 lb

## 2018-06-06 DIAGNOSIS — Z780 Asymptomatic menopausal state: Secondary | ICD-10-CM

## 2018-06-06 DIAGNOSIS — M949 Disorder of cartilage, unspecified: Secondary | ICD-10-CM

## 2018-06-06 DIAGNOSIS — M899 Disorder of bone, unspecified: Secondary | ICD-10-CM | POA: Diagnosis not present

## 2018-06-06 DIAGNOSIS — M810 Age-related osteoporosis without current pathological fracture: Secondary | ICD-10-CM | POA: Diagnosis not present

## 2018-06-06 DIAGNOSIS — M5416 Radiculopathy, lumbar region: Secondary | ICD-10-CM | POA: Diagnosis not present

## 2018-06-06 NOTE — Patient Instructions (Addendum)
Great to see you. Let's continue calcium /vit D, weight bearing exercises and we will repeat your bone density scan in 04/2019.  Non dairy foods that contain calcium:  Kale, oranges, sardines, oatmeal, soy milk/soybeans, salmon, white beans, dried figs, turnip greens, almonds, broccoli, tofu.  Continue with Tylenol twice daily, gabapentin as scheduled with as needed tramadol and tizanidine.  I would stop the prednisone.  Let's touch base after your MRI.

## 2018-06-06 NOTE — Assessment & Plan Note (Signed)
>  40 minutes spent in face to face time with patient, >50% spent in counselling or coordination of care discussing her back pain and osteoporosis and treatment plan given her h/o CAD and medications she takes for medical management of her CAD. Scheduled Tylenol has seemed to have helped along with Gabapentin.  I would prefer she NOT take prednisone given the cardiac risks, ASA interaction, and negative impact on bone density.  I did advised that if she would rather talk with the prescribing MD about this (her spine doctor), that would be more than okay and recommended.  She does not need to wean as she has already created her own wean.  She can stop it if she agrees with my recommendation. After her MRI on 10/28, I asked her to follow up with me for further plans on the treatment of her back pain.

## 2018-06-06 NOTE — Progress Notes (Signed)
Subjective:   Patient ID: Hannah Vasquez, female    DOB: [birth year], 67 y.o.   MRN: [id number]  Hannah Vasquez is a pleasant 67 y.o. year old female who presents to clinic today with Osteoporosis (Patient is here today to discuss new Dx of Osteoporosis per her most recent BMD Scan.  FYI:  Dr. Ron Agee put her on a 12d Prednisone taper and Tizanidine and she is feeling much better.)  on 06/06/2018  HPI:  Osteoporosis- new diagnosis. DEXA done on 05/05/18 showed T score of -2.5 in femoral neck. She is very active- goes to cardiac rehab consistently.  She is taking Vitamin D 1000 IU daily.  DDD with radiculopathy- I last saw her for worsening pain on 05/22/18- note reviewed.  At that [place], she reported that the radiculopathy resolved with gabapentin- taking 300 mg qhs and 100 mg in the afternoon. She was also takingTramadol 50 mg twice daily because of increased pain. I advised her to start taking Tylenol ES -1 tab po twice daily and to increase Tramadol 50 mg up to four times daily as needed for breakthrough severe pain while she awaited her appointment with  Dr. Ermalene Postin the following Tuesday.   She has received injections in past from him in her back. He placed her on 12 day prednisone taper (she has only taken a few of these 5 mg tablets- only took one today) and Tizanidine and she is feeling better.  MRI without contract scheduled for 06/12/18.  Last MRI was in 2017.   Xray of lumbar spine done on 05/15/18 and showed the following:  [place] 4+ VIEW  COMPARISON: Lumbar spine MRI 10/25/2015  FINDINGS: Five non-rib-bearing lumbar vertebra.  Minimal broad-based dextroconvex thoracolumbar scoliosis.  Disc space narrowing L3-L4 through L5-S1 with vacuum phenomenon at L4-L5 and L5-S1.  Scattered mild endplate spur formation inferior thoracic and inferior lumbar spine.  Vertebral body heights maintained.  No fracture, subluxation, or bone destruction.  Mild facet  degenerative changes lower lumbar spine.  No spondylolysis.  IMPRESSION: Degenerative disc facet disease changes of the lumbar spine as above.  No acute abnormalities.  CAD- followed by Dr. Marlou Porch. H/o prior ST elevation myocardial infarction on 01/01/17 stating that she had chest pressure that waxed and waned then returned, troponin was 0.16. She  received 2 stents to the LAD.   Sees Dr. Marlou Porch again in 08/2018.  Current Outpatient Medications on File Prior to Visit  Medication Sig Dispense Refill  . acetaminophen (TYLENOL) 500 MG tablet Take 1 tablet (500 mg total) by mouth 2 (two) times daily. 60 tablet 0  . albuterol (PROVENTIL HFA;VENTOLIN HFA) 108 (90 Base) MCG/ACT inhaler Inhale 1 puff into the lungs every 6 (six) hours as needed for wheezing or shortness of breath.    Marland Kitchen amLODipine (NORVASC) 5 MG tablet Take 1 tablet (5 mg total) by mouth daily. 90 tablet 3  . aspirin EC 81 MG tablet Take 81 mg by mouth every morning.     Marland Kitchen atorvastatin (LIPITOR) 80 MG tablet TAKE 1 TABLET BY MOUTH ONCE A DAY AT 6PM 30 tablet 12  . Cholecalciferol (VITAMIN D3) 1000 UNITS CAPS Take 1 capsule by mouth every morning.     . fluticasone (FLONASE) 50 MCG/ACT nasal spray Place into both nostrils daily.    . furosemide (LASIX) 20 MG tablet Take 1 tablet (20 mg total) by mouth daily as needed for edema. 90 tablet 3  . gabapentin (NEURONTIN) 100 MG capsule Take 1  capsule mid-day (still take the 385m at hs) 90 capsule 0  . gabapentin (NEURONTIN) 300 MG capsule Take 1 capsule (300 mg total) by mouth at bedtime. 30 capsule 3  . isosorbide mononitrate (IMDUR) 30 MG 24 hr tablet TAKE 1 TABLET BY MOUTH ONCE A DAY 90 tablet 3  . loratadine (CLARITIN) 10 MG tablet Take 1 tablet (10 mg total) by mouth every morning. 30 tablet 2  . losartan (COZAAR) 100 MG tablet Take 1 tablet (100 mg total) by mouth daily. 90 tablet 1  . metoprolol succinate (TOPROL-XL) 25 MG 24 hr tablet Take 1 tablet (25 mg total) by mouth  daily. 30 tablet 0  . Multiple Vitamin (MULTIVITAMIN) tablet Take 1 tablet by mouth every morning.     . nitroGLYCERIN (NITROSTAT) 0.4 MG SL tablet Place 1 tablet (0.4 mg total) under the tongue every 5 (five) minutes as needed for chest pain. 25 tablet 3  . Omega-3 Fatty Acids (FISH OIL) 1200 MG CAPS Take 1 capsule by mouth every morning.     .Marland Kitchenomeprazole (PRILOSEC) 40 MG capsule Take 40 mg by mouth daily.     .Marland KitchenoxyCODONE (OXY IR/ROXICODONE) 5 MG immediate release tablet Take 1 tablet (5 mg total) by mouth every 4 (four) hours as needed for severe pain. 30 tablet 0  . potassium chloride (K-DUR) 10 MEQ tablet Take 10 mEq daily as needed by mouth. If taking Furosemide (Lasix)    . predniSONE (DELTASONE) 5 MG tablet Taper per Dr. IRon Agee   . tiZANidine (ZANAFLEX) 4 MG tablet Take 1/5-1tid prn    . traMADol (ULTRAM) 50 MG tablet Take 1 tablet (50 mg total) by mouth 4 (four) times daily. 30 tablet 0   No current facility-administered medications on file prior to visit.     Allergies  Allergen Reactions  . Bactroban [Mupirocin Calcium]     Facial redness and swelling  . Codeine     REACTION: nausea and vomiting  . Penicillins Nausea And Vomiting    REACTION: rash  . Valtrex [Valacyclovir Hcl] Swelling    Per pt causes redness    Past Medical History:  Diagnosis Date  . Allergy   . Anxiety   . CAD (coronary artery disease)   . Cataract   . Hemorrhoids   . Hypertension   . Mitral valve prolapse   . Osteoporosis   . Status post dilation of esophageal narrowing     Past Surgical History:  Procedure Laterality Date  . BASAL CELL CARCINOMA EXCISION  [facility]05[facility]2016   [facility]  . BLADDER SURGERY  11/15/1995   bladder neck suspension, urinary incontinence  . CARDIAC CATHETERIZATION  01/03/2017 DES LAD  . CATARACT EXTRACTION Left 04/30/2016   [place]   Procedure: Coronary Stent Intervention;   Surgeon: ENelva Bush MD;  [facility];  Service: Cardiovascular;  Laterality: N/A;  . DENTAL SURGERY  06/20/2016   Tooth Implant; Dr. BKalman Shan . INTRAVASCULAR ULTRASOUND/IVUS N/A 01/03/2017   Procedure: Intravascular Ultrasound/IVUS;  Surgeon: ENelva Bush MD;  [place] [facility];  Service: Cardiovascular;  Laterality: N/A;  . KNEE SURGERY  2003   torn meniscus  . LEFT HEART CATH AND CORONARY ANGIOGRAPHY N/A 01/03/2017   Procedure: Left Heart Cath and Coronary Angiography;  Surgeon: ENelva Bush MD;  [facility];  Service: Cardiovascular;  Laterality: N/A;  . MOHS SURGERY Right 2017   right side  of face  . ROTATOR CUFF REPAIR    . SHOULDER SURGERY Right 2005    Family History  Problem Relation Age of Onset  . Mitral valve prolapse Mother   . Hypertension Father   . Brain cancer Father     Social History   Socioeconomic History  . Marital status: Divorced    Spouse name: Not on file  . Number of children: 2  . Years of education: Not on file  . Highest education level: Not on file  Occupational History  . 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 Financial resource strain: Not on file  . Food insecurity:    Worry: Not on file    Inability: Not on file  . Transportation needs:    Medical: Not on file    Non-medical: Not on file  Tobacco Use  . Smoking status: Never Smoker  . Smokeless tobacco: Never Used  Substance and Sexual Activity  . Alcohol use: No    Alcohol/week: 0.0 standard drinks  . Drug use: No  . Sexual activity: Not Currently  Lifestyle  . Physical activity:    Days per week: Not on file    Minutes per session: Not on file  . Stress: Not on file  Relationships  . Social connections:    Talks on phone: Not on file    Gets together: Not on file    Attends religious service: Not on file    Active member of club or organization: Not on file    Attends meetings  of clubs or organizations: Not on file    Relationship status: Not on file  . Intimate partner violence:    Fear of current or ex partner: Not on file    Emotionally abused: Not on file    Physically abused: Not on file    Forced sexual activity: Not on file  Other Topics Concern  . Not on file  Social History Narrative   Has a living will.   HPOA- daughter.   She would desire CPR.  Would desire life support if necessary and not futile.         The PMH, PSH, Social History, Family History, Medications, and allergies have been reviewed in [facility], and have been updated if relevant.  Review of Systems  Constitutional: Negative.   Respiratory: Negative.   Cardiovascular: Negative.   Gastrointestinal: Negative.   Musculoskeletal: Positive for back pain.  Neurological: Positive for numbness. Negative for weakness.  All other systems reviewed and are negative.      Objective:    BP 134/84 (BP Location: Left Arm, Patient Position: Sitting, Cuff Size: Normal)   Pulse (!) 53   Temp 98.5 F (36.9 C) (Oral)   Ht 5' 1.91" (1.573 m)   Wt 148 lb 3.2 oz (67.2 kg)   LMP  (LMP Unknown)   SpO2 97%   BMI 27.19 kg/m    Physical Exam  Constitutional: She is oriented to person, place, and time. She appears well-developed and well-nourished. No distress.  HENT:  Head: Normocephalic and atraumatic.  Eyes: EOM are normal.  Neck: Normal range of motion.  Cardiovascular: Normal rate.  Pulmonary/Chest: Effort normal.  Musculoskeletal: Normal range of motion. She exhibits no edema.  Neurological: She is alert and oriented to person, place, and time. No cranial nerve deficit.  Skin: Skin is warm and dry. She is not diaphoretic.  Psychiatric: She has a normal mood and affect. Her behavior  is normal. Judgment and thought content normal.  Nursing note and vitals reviewed.         Assessment & Plan:   Osteoporosis, unspecified osteoporosis type, unspecified pathological fracture  presence  Lumbar back pain with radiculopathy affecting lower extremity No follow-ups on file.

## 2018-06-06 NOTE — Assessment & Plan Note (Signed)
We discussed this at length too.  She is only osteoporotic (-2.5) in one hip, osteopenia in her other hip and spine.  She is doing a lot of weight bearing activity and taking calcium and Vitamin D.  There are potential consequences of starting her on fosomax (ASA interaction) and prolia (as with anything that impact calcium I worry about cardiac impact).  We agreed that I would send my note to Dr. Marlou Porch for his input.  We also agreed we would prefer to not add any other treatment (no more prednisone) and repeat DEXA in 1  Year rather than 2 years as mandated by insurance. The patient indicates understanding of these issues and agrees with the plan.

## 2018-06-07 ENCOUNTER — Encounter (HOSPITAL_COMMUNITY)
Admission: RE | Admit: 2018-06-07 | Discharge: 2018-06-07 | Disposition: A | Discharge status: Home / Self Care | Payer: Self-pay | Source: Ambulatory Visit | Attending: Cardiology | Admitting: Cardiology

## 2018-06-09 ENCOUNTER — Encounter (HOSPITAL_COMMUNITY)
Admission: RE | Admit: 2018-06-09 | Discharge: 2018-06-09 | Disposition: A | Discharge status: Home / Self Care | Payer: Self-pay | Source: Ambulatory Visit | Attending: Cardiology | Admitting: Cardiology

## 2018-06-12 ENCOUNTER — Encounter (HOSPITAL_COMMUNITY)
Admission: RE | Admit: 2018-06-12 | Discharge: 2018-06-12 | Disposition: A | Discharge status: Home / Self Care | Payer: Self-pay | Source: Ambulatory Visit | Attending: Cardiology | Admitting: Cardiology

## 2018-06-12 ENCOUNTER — Ambulatory Visit
Admission: RE | Admit: 2018-06-12 | Discharge: 2018-06-12 | Disposition: A | Discharge status: Home / Self Care | Payer: PPO | Source: Ambulatory Visit | Attending: Physical Medicine and Rehabilitation | Admitting: Physical Medicine and Rehabilitation

## 2018-06-12 DIAGNOSIS — M545 Low back pain, unspecified: Secondary | ICD-10-CM

## 2018-06-14 ENCOUNTER — Telehealth: Payer: Self-pay | Admitting: Cardiology

## 2018-06-14 ENCOUNTER — Encounter (HOSPITAL_COMMUNITY)
Admission: RE | Admit: 2018-06-14 | Discharge: 2018-06-14 | Disposition: A | Discharge status: Home / Self Care | Payer: Self-pay | Source: Ambulatory Visit | Attending: Cardiology | Admitting: Cardiology

## 2018-06-14 NOTE — Telephone Encounter (Signed)
Informed the pt that per our clearance protocol/policy, she needs to have [place] fax [place] a clearance form to have our pre-op clearance team review and advise on.  Advised the pt to touch base with [facility] tomorrow and have them fax this note to our office at [phone] attention to Dr Marlou Porch.  Pt verbalized understanding and agrees with this plan.

## 2018-06-14 NOTE — Telephone Encounter (Signed)
New Message:      Pt is calling in reference to getting some steroid injections and wants to know if it would be okay. She states she had some imaging done at [facility] and that's why they are want her to start getting injections in her lower back. She states she can get some paperwork sent over to our office if needed.

## 2018-06-15 ENCOUNTER — Other Ambulatory Visit: Payer: Self-pay | Admitting: Nurse Practitioner

## 2018-06-16 ENCOUNTER — Encounter (HOSPITAL_COMMUNITY): Payer: Self-pay

## 2018-06-16 DIAGNOSIS — R5383 Other fatigue: Secondary | ICD-10-CM | POA: Insufficient documentation

## 2018-06-16 DIAGNOSIS — R0609 Other forms of dyspnea: Secondary | ICD-10-CM | POA: Insufficient documentation

## 2018-06-16 DIAGNOSIS — I2510 Atherosclerotic heart disease of native coronary artery without angina pectoris: Secondary | ICD-10-CM | POA: Insufficient documentation

## 2018-06-16 DIAGNOSIS — I2583 Coronary atherosclerosis due to lipid rich plaque: Secondary | ICD-10-CM | POA: Insufficient documentation

## 2018-06-16 DIAGNOSIS — I10 Essential (primary) hypertension: Secondary | ICD-10-CM | POA: Insufficient documentation

## 2018-06-19 ENCOUNTER — Encounter (HOSPITAL_COMMUNITY)
Admission: RE | Admit: 2018-06-19 | Discharge: 2018-06-19 | Disposition: A | Discharge status: Home / Self Care | Payer: Self-pay | Source: Ambulatory Visit | Attending: Cardiology | Admitting: Cardiology

## 2018-06-20 ENCOUNTER — Encounter: Payer: Self-pay | Admitting: Family Medicine

## 2018-06-20 ENCOUNTER — Ambulatory Visit (INDEPENDENT_AMBULATORY_CARE_PROVIDER_SITE_OTHER): Payer: PPO | Admitting: Family Medicine

## 2018-06-20 VITALS — BP 124/62 | HR 62 | Temp 98.6°F | Resp 16 | Ht 61.0 in | Wt 147.6 lb

## 2018-06-20 DIAGNOSIS — M5416 Radiculopathy, lumbar region: Secondary | ICD-10-CM

## 2018-06-20 MED ORDER — TRAMADOL HCL 50 MG PO TABS: 50.0000 mg | ORAL_TABLET | Freq: Four times a day (QID) | ORAL | 0 refills | Status: DC | PRN

## 2018-06-20 MED ORDER — GABAPENTIN 300 MG PO CAPS: 300.0000 mg | ORAL_CAPSULE | Freq: Three times a day (TID) | ORAL | 3 refills | Status: DC

## 2018-06-20 NOTE — Patient Instructions (Signed)
Great to see you. We are increasing your Gabapentin 300 mg three times daily as needed. I have also refilled your tramadol.

## 2018-06-20 NOTE — Progress Notes (Signed)
Subjective:   Patient ID: Hannah Vasquez, female    DOB: 1951/07/01, 67 y.o.   MRN: [id number]  Hannah Vasquez is a pleasant 67 y.o. year old female who presents to clinic today with Follow-up (Pt her to follow up on MRI results )  on 06/20/2018  HPI:  She came in today to ask questions about her MRI that Dr. Margarito Liner ordered. It does some progression of lower lumbar disease and disc protrusion. Mr Lumbar Spine Wo Contrast  Result Date: 06/12/2018 CLINICAL DATA:  Low back pain. EXAM: MRI LUMBAR SPINE WITHOUT CONTRAST TECHNIQUE: Multiplanar, multisequence MR imaging of the lumbar spine was performed. No intravenous contrast was administered. COMPARISON:  Lumbar spine radiographs 05/15/2018. MRI of the lumbar spine 10/25/2015 FINDINGS: Segmentation: 5 non rib-bearing lumbar type vertebral bodies are present. The lowest fully formed vertebral body is L5. Alignment: AP alignment is anatomic. Mild leftward curvature is stable, centered at L4. Vertebrae: Small Schmorl's nodes are present at L2-3. A hemangioma is noted anteriorly at L1. Mild endplate marrow changes are present on the right at L4-5 and on the left at L5-S1. Marrow signal and vertebral body heights are otherwise normal. Conus medullaris and cauda equina: Conus extends to the L1 level. Conus and cauda equina appear normal. Paraspinal and other soft tissues: Limited imaging of the abdomen is unremarkable. There is no significant adenopathy. Disc levels: L1-2: Negative. L2-3: Negative. L3-4: A broad-based disc protrusion is asymmetric to the left. Mild foraminal narrowing is present bilaterally, worse on the right. L4-5: A broad-based disc protrusion is present. Moderate facet hypertrophy is noted bilaterally. Central canal is patent moderate right and mild left foraminal stenosis is similar to the prior exam. L5-S1: A broad-based disc protrusion is present. Progressive moderate facet hypertrophy is noted on the left. Moderate left foraminal  narrowing has progressed. Mild right foraminal narrowing is similar to the prior exam. IMPRESSION: 1. Progressive asymmetric left-sided facet hypertrophy at L5-S1 with progressive moderate left foraminal stenosis. 2. Stable mild right foraminal narrowing at L5-S1. 3. Moderate right and mild left foraminal narrowing at L4-5 is stable. 4. Mild bilateral foraminal narrowing at L3-4. 5. Electronically Signed   By: San Morelle M.D.   On: 06/12/2018 14:47   She was told that spine injections would be the next step. She wants to talk with her cardiologist, Dr. Marlou Porch before pursuing  this route.  Gabapentin has helped tremendously and no longer makes her sleepy. She is currently taking 100 mg during the day and 300 mg qhs. She is asking if she can try to increase the dose.  Also needs to refill tramadol.  Current Outpatient Medications on File Prior to Visit  Medication Sig Dispense Refill  . acetaminophen (TYLENOL) 500 MG tablet Take 1 tablet (500 mg total) by mouth 2 (two) times daily. 60 tablet 0  . albuterol (PROVENTIL HFA;VENTOLIN HFA) 108 (90 Base) MCG/ACT inhaler Inhale 1 puff into the lungs every 6 (six) hours as needed for wheezing or shortness of breath.    Marland Kitchen amLODipine (NORVASC) 5 MG tablet Take 1 tablet (5 mg total) by mouth daily. 90 tablet 3  . aspirin EC 81 MG tablet Take 81 mg by mouth every morning.     Marland Kitchen atorvastatin (LIPITOR) 80 MG tablet TAKE 1 TABLET BY MOUTH ONCE A DAY AT 6PM 30 tablet 12  . Calcium-Magnesium-Vitamin D (CALCIUM 1200+D3 PO) Take 1,200 mg by mouth.    . Cholecalciferol (VITAMIN D3) 1000 UNITS CAPS Take 1 capsule by mouth  every morning.     . fluticasone (FLONASE) 50 MCG/ACT nasal spray Place into both nostrils daily.    . furosemide (LASIX) 20 MG tablet Take 1 tablet (20 mg total) by mouth daily as needed for edema. 90 tablet 3  . isosorbide mononitrate (IMDUR) 30 MG 24 hr tablet TAKE 1 TABLET BY MOUTH ONCE A DAY 90 tablet 3  . loratadine (CLARITIN) 10 MG  tablet Take 1 tablet (10 mg total) by mouth every morning. 30 tablet 2  . losartan (COZAAR) 100 MG tablet TAKE 1 TABLET BY MOUTH ONCE DAILY 90 tablet 2  . metoprolol succinate (TOPROL-XL) 25 MG 24 hr tablet Take 1 tablet (25 mg total) by mouth daily. 30 tablet 0  . Multiple Vitamin (MULTIVITAMIN) tablet Take 1 tablet by mouth every morning.     . nitroGLYCERIN (NITROSTAT) 0.4 MG SL tablet Place 1 tablet (0.4 mg total) under the tongue every 5 (five) minutes as needed for chest pain. 25 tablet 3  . Omega-3 Fatty Acids (FISH OIL) 1200 MG CAPS Take 1 capsule by mouth every morning.     Marland Kitchen omeprazole (PRILOSEC) 40 MG capsule Take 40 mg by mouth daily.     Marland Kitchen oxyCODONE (OXY IR/ROXICODONE) 5 MG immediate release tablet Take 1 tablet (5 mg total) by mouth every 4 (four) hours as needed for severe pain. 30 tablet 0  . potassium chloride (K-DUR) 10 MEQ tablet Take 10 mEq daily as needed by mouth. If taking Furosemide (Lasix)    . tiZANidine (ZANAFLEX) 4 MG tablet Take 1/5-1tid prn     No current facility-administered medications on file prior to visit.     Allergies  Allergen Reactions  . Bactroban [Mupirocin Calcium]     Facial redness and swelling  . Codeine     REACTION: nausea and vomiting  . Penicillins Nausea And Vomiting    REACTION: rash  . Valtrex [Valacyclovir Hcl] Swelling    Per pt causes redness    Past Medical History:  Diagnosis Date  . Allergy   . Anxiety   . CAD (coronary artery disease)   . Cataract   . Hemorrhoids   . Hypertension   . Mitral valve prolapse   . Osteoporosis   . Status post dilation of esophageal narrowing     Past Surgical History:  Procedure Laterality Date  . BASAL CELL CARCINOMA EXCISION  02/05/2016   Dr. Sarajane Jews, [facility]  . BLADDER SURGERY  11/15/1995   bladder neck suspension, urinary incontinence  . CARDIAC CATHETERIZATION  01/03/2017 DES LAD  . CATARACT EXTRACTION Left 04/30/2016   Dr. Tommy Rainwater, [place] N/A 01/03/2017   Procedure: Coronary Stent Intervention;  Surgeon: Nelva Bush, MD;  Location: [place] [facility];  Service: Cardiovascular;  Laterality: N/A;  . DENTAL SURGERY  06/20/2016   Tooth Implant; Dr. Kalman Shan  . INTRAVASCULAR ULTRASOUND/IVUS N/A 01/03/2017   Procedure: Intravascular Ultrasound/IVUS;  Surgeon: Nelva Bush, MD;  Location: [place] [facility];  Service: Cardiovascular;  Laterality: N/A;  . KNEE SURGERY  2003   torn meniscus  . LEFT HEART CATH AND CORONARY ANGIOGRAPHY N/A 01/03/2017   Procedure: Left Heart Cath and Coronary Angiography;  Surgeon: Nelva Bush, MD;  Location: [place] [facility];  Service: Cardiovascular;  Laterality: N/A;  . MOHS SURGERY Right 2017   right side of face  . ROTATOR CUFF REPAIR    . SHOULDER SURGERY Right 2005    Family History  Problem Relation Age of  Onset  . Mitral valve prolapse Mother   . Hypertension Father   . Brain cancer Father     Social History   Socioeconomic History  . Marital status: Divorced    Spouse name: Not on file  . Number of children: 2  . Years of education: Not on file  . Highest education level: Not on file  Occupational History  . 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 Financial resource strain: Not on file  . Food insecurity:    Worry: Not on file    Inability: Not on file  . Transportation needs:    Medical: Not on file    Non-medical: Not on file  Tobacco Use  . Smoking status: Never Smoker  . Smokeless tobacco: Never Used  Substance and Sexual Activity  . Alcohol use: No    Alcohol/week: 0.0 standard drinks  . Drug use: No  . Sexual activity: Not Currently  Lifestyle  . Physical activity:    Days per week: Not on file    Minutes per session: Not on file  . Stress: Not on file  Relationships  . Social connections:    Talks on phone: Not on file    Gets together: Not on file    Attends religious service: Not on file     Active member of club or organization: Not on file    Attends meetings of clubs or organizations: Not on file    Relationship status: Not on file  . Intimate partner violence:    Fear of current or ex partner: Not on file    Emotionally abused: Not on file    Physically abused: Not on file    Forced sexual activity: Not on file  Other Topics Concern  . Not on file  Social History Narrative   Has a living will.   HPOA- daughter.   She would desire CPR.  Would desire life support if necessary and not futile.         The PMH, PSH, Social History, Family History, Medications, and allergies have been reviewed in [facility], and have been updated if relevant.  Review of Systems  Constitutional: Negative.   Genitourinary: Negative.   Musculoskeletal: Positive for back pain. Negative for arthralgias, gait problem, joint swelling, myalgias, neck pain and neck stiffness.  Neurological: Positive for weakness and numbness. Negative for dizziness, tremors, seizures, syncope, facial asymmetry, speech difficulty, light-headedness and headaches.  All other systems reviewed and are negative.      Objective:    BP 124/62   Pulse 62   Temp 98.6 F (37 C) (Oral)   Resp 16   Ht 5' 1"  (1.549 m)   Wt 147 lb 9.6 oz (67 kg)   LMP  (LMP Unknown)   SpO2 98%   BMI 27.89 kg/m    Physical Exam  Constitutional: She is oriented to person, place, and time. She appears well-developed and well-nourished. No distress.  HENT:  Head: Normocephalic and atraumatic.  Eyes: EOM are normal.  Neck: Normal range of motion.  Cardiovascular: Normal rate.  Pulmonary/Chest: Effort normal and breath sounds normal.  Musculoskeletal: Normal range of motion. She exhibits no edema.  Neurological: She is alert and oriented to person, place, and time. No cranial nerve deficit.  Skin: Skin is warm and dry. She is not diaphoretic.  Psychiatric: She has a normal mood and affect. Her behavior is normal. Judgment and thought  content normal.  Nursing note  and vitals reviewed.         Assessment & Plan:   Lumbar back pain with radiculopathy affecting lower extremity No follow-ups on file.

## 2018-06-20 NOTE — Assessment & Plan Note (Signed)
>  25 minutes spent in face to face time with patient, >50% spent in counselling or coordination of care Deteriorated. Discussed MRI results with patient- showed her where the disease has progressed.  Increase gabapentin since she is tolerating it well to 300 mg three times daily prn.  Continue tramadol as needed for severe pain. She will contact me after she has talked with Dr. Marlou Porch and Dr. Margarito Liner. The patient indicates understanding of these issues and agrees with the plan.

## 2018-06-21 ENCOUNTER — Encounter (HOSPITAL_COMMUNITY)
Admission: RE | Admit: 2018-06-21 | Discharge: 2018-06-21 | Disposition: A | Discharge status: Home / Self Care | Payer: Self-pay | Source: Ambulatory Visit | Attending: Cardiology | Admitting: Cardiology

## 2018-06-22 ENCOUNTER — Telehealth: Payer: Self-pay | Admitting: *Deleted

## 2018-06-22 NOTE — Telephone Encounter (Signed)
She was told that spine injections would be the next step. She wants to talk with her cardiologist, Dr. Marlou Porch before pursuing  this route. - Per Dr Arnette Norris, MD office visit note.  Results from MR Lumbar Spine 06/12/2018  IMPRESSION: 1. Progressive asymmetric left-sided facet hypertrophy at L5-S1 with progressive moderate left foraminal stenosis. 2. Stable mild right foraminal narrowing at L5-S1. 3. Moderate right and mild left foraminal narrowing at L4-5 is stable. 4. Mild bilateral foraminal narrowing at L3-4.R Lumbar spine wo contrast results:

## 2018-06-23 ENCOUNTER — Encounter (HOSPITAL_COMMUNITY): Payer: Self-pay

## 2018-06-23 NOTE — Telephone Encounter (Signed)
Spoke with patient who is concerned because she thinks she remembers Dr Marlou Porch telling her she could not have injections into her back again.  Advised pt I don't see any documentation of that.  At one point she had a NSTEMI and was on Effient and ASA which had to be continued for at least 12 months.  That was 01/03/2017.  Advised if the MD doing the injections needs approval or medical clearance for her to have them fax over the request and any medications they are wanting to hold Dr Marlou Porch can then review to make a decision.  Pt states understanding and she will have the MD doing the injections to request clearance if necessary.

## 2018-06-26 ENCOUNTER — Encounter (HOSPITAL_COMMUNITY): Payer: Self-pay

## 2018-06-28 ENCOUNTER — Encounter (HOSPITAL_COMMUNITY): Payer: Self-pay

## 2018-06-30 ENCOUNTER — Encounter (HOSPITAL_COMMUNITY): Payer: Self-pay

## 2018-07-03 ENCOUNTER — Telehealth (HOSPITAL_COMMUNITY): Payer: Self-pay

## 2018-07-03 ENCOUNTER — Encounter (HOSPITAL_COMMUNITY): Payer: Self-pay

## 2018-07-03 ENCOUNTER — Ambulatory Visit: Payer: PPO | Admitting: Family Medicine

## 2018-07-03 NOTE — Telephone Encounter (Signed)
Pt called and wanted to let you know that she will be returning on Wednesday, 11/20.

## 2018-07-05 ENCOUNTER — Encounter (HOSPITAL_COMMUNITY)
Admission: RE | Admit: 2018-07-05 | Discharge: 2018-07-05 | Disposition: A | Discharge status: Home / Self Care | Payer: Self-pay | Source: Ambulatory Visit | Attending: Cardiology | Admitting: Cardiology

## 2018-07-07 ENCOUNTER — Encounter (HOSPITAL_COMMUNITY): Payer: Self-pay

## 2018-07-10 ENCOUNTER — Encounter (HOSPITAL_COMMUNITY): Payer: Self-pay

## 2018-07-12 ENCOUNTER — Encounter (HOSPITAL_COMMUNITY): Payer: Self-pay

## 2018-07-14 ENCOUNTER — Encounter (HOSPITAL_COMMUNITY): Payer: Self-pay

## 2018-07-17 ENCOUNTER — Encounter (HOSPITAL_COMMUNITY)
Admission: RE | Admit: 2018-07-17 | Discharge: 2018-07-17 | Disposition: A | Discharge status: Home / Self Care | Payer: Self-pay | Source: Ambulatory Visit | Attending: Cardiology | Admitting: Cardiology

## 2018-07-17 DIAGNOSIS — I10 Essential (primary) hypertension: Secondary | ICD-10-CM | POA: Insufficient documentation

## 2018-07-17 DIAGNOSIS — I2510 Atherosclerotic heart disease of native coronary artery without angina pectoris: Secondary | ICD-10-CM | POA: Insufficient documentation

## 2018-07-17 DIAGNOSIS — R0609 Other forms of dyspnea: Secondary | ICD-10-CM | POA: Insufficient documentation

## 2018-07-17 DIAGNOSIS — R5383 Other fatigue: Secondary | ICD-10-CM | POA: Insufficient documentation

## 2018-07-17 DIAGNOSIS — I2583 Coronary atherosclerosis due to lipid rich plaque: Secondary | ICD-10-CM | POA: Insufficient documentation

## 2018-07-19 ENCOUNTER — Encounter (HOSPITAL_COMMUNITY)
Admission: RE | Admit: 2018-07-19 | Discharge: 2018-07-19 | Disposition: A | Discharge status: Home / Self Care | Payer: Self-pay | Source: Ambulatory Visit | Attending: Cardiology | Admitting: Cardiology

## 2018-07-21 ENCOUNTER — Encounter (HOSPITAL_COMMUNITY): Payer: Self-pay

## 2018-07-24 ENCOUNTER — Encounter (HOSPITAL_COMMUNITY)
Admission: RE | Admit: 2018-07-24 | Discharge: 2018-07-24 | Disposition: A | Discharge status: Home / Self Care | Payer: PPO | Source: Ambulatory Visit | Attending: Cardiology | Admitting: Cardiology

## 2018-07-25 ENCOUNTER — Ambulatory Visit: Payer: PPO | Admitting: Family Medicine

## 2018-07-25 NOTE — Progress Notes (Unsigned)
Error

## 2018-07-25 NOTE — Progress Notes (Signed)
Subjective:   Patient ID: Hannah Vasquez, female    DOB: [birth year], 67 y.o.   MRN: [id number]  Hannah Vasquez is a pleasant 67 y.o. year old female who presents to clinic today with Edema (Patient is here today C/O BLE edema, erythema, and pain. Sx started 3-weeks-ago but the Sx have decreased since then but she feels so exhausted.  She was at [employer] when this started.  It has come back bad a couple of times since then but the main thing is her energy level. Denies any tick bites recent but last was several years ago.  Has OV with Dr. Marlou Porch on the 16th.)  on 07/26/2018  HPI:  Here for bilateral LE edema, erythema and pain.  Symptoms started 3 weeks ago while she was at [employer], second day into the trip. She kept her legs elevated at the end of the day. She did not have any CP or SOB. Symptoms did not resolve until after she got home. She still feels very tired. Has gone to cardiac rehab since she returned and had no CP or SOB while she was there.  I last saw her on 06/20/18.  Note reviewed.  At that time, she was following up from her appointment with her spine specialist, Dr. Perry Mount.  MRI that he ordered which was completed on 06/12/18 did show some progression of lumbar disease and disc protrusion-  IMPRESSION: 1. Progressive asymmetric left-sided facet hypertrophy at L5-S1 with progressive moderate left foraminal stenosis. 2. Stable mild right foraminal narrowing at L5-S1. 3. Moderate right and mild left foraminal narrowing at L4-5 is stable. 4. Mild bilateral foraminal narrowing at L3-4.  She was told that spine injections would be the next step. She wants to talk with her cardiologist, Dr. Marlou Porch before pursuing  this route.  She has an appointment with Dr. Marlou Porch next week.  Gabapentin has helped tremendously and no longer makes her sleepy. We did increase her dose of Gabapentin to 300 mg three times daily prn at her last OV on 06/20/18.  And that has helped  tremendously.  Also does take tramadol as needed for severe pain.  Lab Results  Component Value Date   TSH 1.61 01/31/2018   Lab Results  Component Value Date   WBC 8.5 01/31/2018   HGB 12.6 01/31/2018   HCT 37.4 01/31/2018   MCV 81.3 01/31/2018   PLT 311.0 01/31/2018      Current Outpatient Medications on File Prior to Visit  Medication Sig Dispense Refill  . acetaminophen (TYLENOL) 500 MG tablet Take 1 tablet (500 mg total) by mouth 2 (two) times daily. 60 tablet 0  . albuterol (PROVENTIL HFA;VENTOLIN HFA) 108 (90 Base) MCG/ACT inhaler Inhale 1 puff into the lungs every 6 (six) hours as needed for wheezing or shortness of breath.    Marland Kitchen amLODipine (NORVASC) 5 MG tablet Take 1 tablet (5 mg total) by mouth daily. 90 tablet 3  . aspirin EC 81 MG tablet Take 81 mg by mouth every morning.     Marland Kitchen atorvastatin (LIPITOR) 80 MG tablet TAKE 1 TABLET BY MOUTH ONCE A DAY AT 6PM 30 tablet 12  . Calcium-Magnesium-Vitamin D (CALCIUM 1200+D3 PO) Take 1,200 mg by mouth.    . Cholecalciferol (VITAMIN D3) 1000 UNITS CAPS Take 1 capsule by mouth every morning.     . fluticasone (FLONASE) 50 MCG/ACT nasal spray Place into both nostrils daily.    . furosemide (LASIX) 20 MG tablet Take 1 tablet (20  mg total) by mouth daily as needed for edema. 90 tablet 3  . gabapentin (NEURONTIN) 300 MG capsule Take 1 capsule (300 mg total) by mouth 3 (three) times daily. 90 capsule 3  . isosorbide mononitrate (IMDUR) 30 MG 24 hr tablet TAKE 1 TABLET BY MOUTH ONCE A DAY 90 tablet 3  . loratadine (CLARITIN) 10 MG tablet Take 1 tablet (10 mg total) by mouth every morning. 30 tablet 2  . losartan (COZAAR) 100 MG tablet TAKE 1 TABLET BY MOUTH ONCE DAILY 90 tablet 2  . metoprolol succinate (TOPROL-XL) 25 MG 24 hr tablet Take 1 tablet (25 mg total) by mouth daily. 30 tablet 0  . Multiple Vitamin (MULTIVITAMIN) tablet Take 1 tablet by mouth every morning.     . nitroGLYCERIN (NITROSTAT) 0.4 MG SL tablet Place 1 tablet (0.4 mg  total) under the tongue every 5 (five) minutes as needed for chest pain. 25 tablet 3  . Omega-3 Fatty Acids (FISH OIL) 1200 MG CAPS Take 1 capsule by mouth every morning.     Marland Kitchen omeprazole (PRILOSEC) 40 MG capsule Take 40 mg by mouth daily.     Marland Kitchen oxyCODONE (OXY IR/ROXICODONE) 5 MG immediate release tablet Take 1 tablet (5 mg total) by mouth every 4 (four) hours as needed for severe pain. 30 tablet 0  . potassium chloride (K-DUR) 10 MEQ tablet Take 10 mEq daily as needed by mouth. If taking Furosemide (Lasix)    . tiZANidine (ZANAFLEX) 4 MG tablet Take 1/5-1tid prn    . traMADol (ULTRAM) 50 MG tablet Take 1 tablet (50 mg total) by mouth 4 (four) times daily as needed. 90 tablet 0   No current facility-administered medications on file prior to visit.     Allergies  Allergen Reactions  . Bactroban [Mupirocin Calcium]     Facial redness and swelling  . Codeine     REACTION: nausea and vomiting  . Penicillins Nausea And Vomiting    REACTION: rash  . Valtrex [Valacyclovir Hcl] Swelling    Per pt causes redness    Past Medical History:  Diagnosis Date  . Allergy   . Anxiety   . CAD (coronary artery disease)   . Cataract   . Hemorrhoids   . Hypertension   . Mitral valve prolapse   . Osteoporosis   . Status post dilation of esophageal narrowing     Past Surgical History:  Procedure Laterality Date  . BASAL CELL CARCINOMA EXCISION  02/05/2016   Dr. Sarajane Jews, [facility]  . BLADDER SURGERY  11/15/1995   bladder neck suspension, urinary incontinence  . CARDIAC CATHETERIZATION  01/03/2017 DES LAD  . CATARACT EXTRACTION Left 04/30/2016   Dr. Tommy Rainwater, [place] N/A 01/03/2017   Procedure: Coronary Stent Intervention;  Surgeon: Nelva Bush, MD;  Location: [place] [facility];  Service: Cardiovascular;  Laterality: N/A;  . DENTAL SURGERY  06/20/2016   Tooth Implant; Dr. Kalman Shan  . INTRAVASCULAR ULTRASOUND/IVUS N/A 01/03/2017   Procedure:  Intravascular Ultrasound/IVUS;  Surgeon: Nelva Bush, MD;  Location: [place] [facility];  Service: Cardiovascular;  Laterality: N/A;  . KNEE SURGERY  2003   torn meniscus  . LEFT HEART CATH AND CORONARY ANGIOGRAPHY N/A 01/03/2017   Procedure: Left Heart Cath and Coronary Angiography;  Surgeon: Nelva Bush, MD;  Location: [place] [facility];  Service: Cardiovascular;  Laterality: N/A;  . MOHS SURGERY Right 2017   right side of face  . ROTATOR CUFF REPAIR    .  SHOULDER SURGERY Right 2005    Family History  Problem Relation Age of Onset  . Mitral valve prolapse Mother   . Hypertension Father   . Brain cancer Father     Social History   Socioeconomic History  . Marital status: Divorced    Spouse name: Not on file  . Number of children: 2  . Years of education: Not on file  . Highest education level: Not on file  Occupational History  . 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 Financial resource strain: Not on file  . Food insecurity:    Worry: Not on file    Inability: Not on file  . Transportation needs:    Medical: Not on file    Non-medical: Not on file  Tobacco Use  . Smoking status: Never Smoker  . Smokeless tobacco: Never Used  Substance and Sexual Activity  . Alcohol use: No    Alcohol/week: 0.0 standard drinks  . Drug use: No  . Sexual activity: Not Currently  Lifestyle  . Physical activity:    Days per week: Not on file    Minutes per session: Not on file  . Stress: Not on file  Relationships  . Social connections:    Talks on phone: Not on file    Gets together: Not on file    Attends religious service: Not on file    Active member of club or organization: Not on file    Attends meetings of clubs or organizations: Not on file    Relationship status: Not on file  . Intimate partner violence:    Fear of current or ex partner: Not on file    Emotionally abused: Not on file    Physically abused: Not on file     Forced sexual activity: Not on file  Other Topics Concern  . Not on file  Social History Narrative   Has a living will.   HPOA- daughter.   She would desire CPR.  Would desire life support if necessary and not futile.         The PMH, PSH, Social History, Family History, Medications, and allergies have been reviewed in [facility], and have been updated if relevant.   Review of Systems  Constitutional: Positive for fatigue.  Eyes: Negative.   Respiratory: Positive for cough and shortness of breath.   Cardiovascular: Negative.   Gastrointestinal: Negative.   Endocrine: Negative.   Genitourinary: Negative.   Musculoskeletal: Negative.   Allergic/Immunologic: Negative.   Neurological: Negative.   Hematological: Negative.   Psychiatric/Behavioral: Negative.   All other systems reviewed and are negative.      Objective:    BP 136/72 (BP Location: Left Arm, Patient Position: Sitting, Cuff Size: Normal)   Pulse 60   Temp 98.3 F (36.8 C) (Oral)   Ht 5' 1"  (1.549 m)   Wt 146 lb 9.6 oz (66.5 kg)   LMP  (LMP Unknown)   SpO2 98%   BMI 27.70 kg/m    Physical Exam   General:  Well-developed,well-nourished,in no acute distress; alert,appropriate and cooperative throughout examination Head:  normocephalic and atraumatic.   Eyes:  vision grossly intact, PERRL Ears:  R ear normal and L ear normal externally, TMs clear bilaterally Nose:  no external deformity.   Mouth:  good dentition.   Neck:  No deformities, masses, or tenderness noted. Lungs:  Normal respiratory effort, chest expands symmetrically. Lungs are clear to auscultation, no crackles or wheezes.  Heart:  Normal rate and regular rhythm. S1 and S2 normal without gallop, murmur, click, rub or other extra sounds. Abdomen:  Bowel sounds positive,abdomen soft and non-tender without masses, organomegaly or hernias noted. Msk:  No deformity or scoliosis noted of thoracic or lumbar spine.   Extremities:  No clubbing, cyanosis,  edema, or deformity noted with normal full range of motion of all joints.   Neurologic:  alert & oriented X3 and gait normal.   Skin:  Intact without suspicious lesions or rashes Psych:  Cognition and judgment appear intact. Alert and cooperative with normal attention span and concentration. No apparent delusions, illusions, hallucinations       Assessment & Plan:   Edema of both lower extremities - Plan: Comp Met (CMET), CBC w/Diff  Lumbar radiculopathy  Lumbar back pain with radiculopathy affecting lower extremity No follow-ups on file.

## 2018-07-26 ENCOUNTER — Ambulatory Visit (INDEPENDENT_AMBULATORY_CARE_PROVIDER_SITE_OTHER): Payer: PPO | Admitting: Family Medicine

## 2018-07-26 ENCOUNTER — Encounter: Payer: Self-pay | Admitting: Family Medicine

## 2018-07-26 ENCOUNTER — Ambulatory Visit (INDEPENDENT_AMBULATORY_CARE_PROVIDER_SITE_OTHER): Payer: PPO

## 2018-07-26 ENCOUNTER — Encounter (HOSPITAL_COMMUNITY)
Admission: RE | Admit: 2018-07-26 | Discharge: 2018-07-26 | Disposition: A | Discharge status: Home / Self Care | Payer: Self-pay | Source: Ambulatory Visit | Attending: Cardiology | Admitting: Cardiology

## 2018-07-26 VITALS — BP 136/72 | HR 60 | Temp 98.3°F | Ht 61.0 in | Wt 146.6 lb

## 2018-07-26 DIAGNOSIS — M5416 Radiculopathy, lumbar region: Secondary | ICD-10-CM | POA: Diagnosis not present

## 2018-07-26 DIAGNOSIS — R600 Localized edema: Secondary | ICD-10-CM

## 2018-07-26 DIAGNOSIS — R5383 Other fatigue: Secondary | ICD-10-CM

## 2018-07-26 LAB — COMPREHENSIVE METABOLIC PANEL WITH GFR
ALT: 18 U/L (ref 0–35)
AST: 24 U/L (ref 0–37)
Albumin: 4.2 g/dL (ref 3.5–5.2)
Alkaline Phosphatase: 137 U/L — ABNORMAL HIGH (ref 39–117)
BUN: 8 mg/dL (ref 6–23)
CO2: 28 meq/L (ref 19–32)
Calcium: 9.3 mg/dL (ref 8.4–10.5)
Chloride: 107 meq/L (ref 96–112)
Creatinine, Ser: 0.74 mg/dL (ref 0.40–1.20)
GFR: 82.51 mL/min
Glucose, Bld: 119 mg/dL — ABNORMAL HIGH (ref 70–99)
Potassium: 3.5 meq/L (ref 3.5–5.1)
Sodium: 144 meq/L (ref 135–145)
Total Bilirubin: 0.4 mg/dL (ref 0.2–1.2)
Total Protein: 7.2 g/dL (ref 6.0–8.3)

## 2018-07-26 LAB — CBC WITH DIFFERENTIAL/PLATELET
BASOS PCT: 0.4 % (ref 0.0–3.0)
Basophils Absolute: 0 10*3/uL (ref 0.0–0.1)
EOS ABS: 0.2 10*3/uL (ref 0.0–0.7)
Eosinophils Relative: 2.4 % (ref 0.0–5.0)
HEMATOCRIT: 37.8 % (ref 36.0–46.0)
Hemoglobin: 12.9 g/dL (ref 12.0–15.0)
Lymphocytes Relative: 34.7 % (ref 12.0–46.0)
Lymphs Abs: 3 10*3/uL (ref 0.7–4.0)
MCHC: 34 g/dL (ref 30.0–36.0)
MCV: 80.5 fl (ref 78.0–100.0)
Monocytes Absolute: 0.5 10*3/uL (ref 0.1–1.0)
Monocytes Relative: 6.1 % (ref 3.0–12.0)
NEUTROS ABS: 4.8 10*3/uL (ref 1.4–7.7)
NEUTROS PCT: 56.4 % (ref 43.0–77.0)
PLATELETS: 297 10*3/uL (ref 150.0–400.0)
RBC: 4.7 Mil/uL (ref 3.87–5.11)
RDW: 14.7 % (ref 11.5–15.5)
WBC: 8.6 10*3/uL (ref 4.0–10.5)

## 2018-07-26 LAB — VITAMIN D 25 HYDROXY (VIT D DEFICIENCY, FRACTURES): VITD: 43.37 ng/mL (ref 30.00–100.00)

## 2018-07-26 LAB — BRAIN NATRIURETIC PEPTIDE: Pro B Natriuretic peptide (BNP): 21 pg/mL (ref 0.0–100.0)

## 2018-07-26 LAB — TSH: TSH: 2 u[IU]/mL (ref 0.35–4.50)

## 2018-07-26 LAB — TROPONIN I: TNIDX: 0.01 ug/L (ref 0.00–0.06)

## 2018-07-26 NOTE — Addendum Note (Signed)
Addended by: Diona Foley on: 07/26/2018 10:57 AM   Modules accepted: Orders

## 2018-07-26 NOTE — Assessment & Plan Note (Signed)
>  25 minutes spent in face to face time with patient, >50% spent in counselling or coordination of care discussing fatigue, LE edema. Edema has resolved- was bilateral- likely dependent and much less likely DVT. Given cardiac history will check BNP, Troponin, CXR for patient reassurance. Orders Placed This Encounter  Procedures  . DG Chest 2 View  . Comp Met (CMET)  . CBC w/Diff  . Vitamin D (25 hydroxy)  . TSH  . B Nat Peptide  . Troponin I  . Comprehensive metabolic panel

## 2018-07-26 NOTE — Assessment & Plan Note (Signed)
Improved with current dose Gabapentin. No changes made.

## 2018-07-26 NOTE — Patient Instructions (Signed)
Great to see you. I will call you with your results from today and you can view them online.   Happy Holidays!

## 2018-07-27 ENCOUNTER — Telehealth: Payer: Self-pay

## 2018-07-27 NOTE — Telephone Encounter (Signed)
Copied from [place] [id number]. Topic: General - Other >> Jul 27, 2018 11:08 AM Alfredia Ferguson R wrote: Patient is calling in wanting someone to call her back to explain to her the results that was released on her my chart.  CB# [phone]

## 2018-07-28 ENCOUNTER — Encounter (HOSPITAL_COMMUNITY): Payer: Self-pay

## 2018-07-31 ENCOUNTER — Encounter: Payer: Self-pay | Admitting: Nurse Practitioner

## 2018-07-31 ENCOUNTER — Ambulatory Visit: Payer: PPO | Admitting: Nurse Practitioner

## 2018-07-31 ENCOUNTER — Encounter (HOSPITAL_COMMUNITY): Payer: Self-pay

## 2018-07-31 VITALS — BP 160/78 | HR 59 | Ht 62.0 in | Wt 147.8 lb

## 2018-07-31 DIAGNOSIS — I2510 Atherosclerotic heart disease of native coronary artery without angina pectoris: Secondary | ICD-10-CM

## 2018-07-31 DIAGNOSIS — I10 Essential (primary) hypertension: Secondary | ICD-10-CM | POA: Diagnosis not present

## 2018-07-31 DIAGNOSIS — E7800 Pure hypercholesterolemia, unspecified: Secondary | ICD-10-CM | POA: Diagnosis not present

## 2018-07-31 DIAGNOSIS — R5383 Other fatigue: Secondary | ICD-10-CM | POA: Diagnosis not present

## 2018-07-31 LAB — HEPATIC FUNCTION PANEL
ALT: 20 IU/L (ref 0–32)
AST: 22 IU/L (ref 0–40)
Albumin: 4.1 g/dL (ref 3.6–4.8)
Alkaline Phosphatase: 166 IU/L — ABNORMAL HIGH (ref 39–117)
Bilirubin Total: 0.6 mg/dL (ref 0.0–1.2)
Bilirubin, Direct: 0.14 mg/dL (ref 0.00–0.40)
Total Protein: 6.6 g/dL (ref 6.0–8.5)

## 2018-07-31 LAB — LIPID PANEL
Chol/HDL Ratio: 3.2 ratio (ref 0.0–4.4)
Cholesterol, Total: 107 mg/dL (ref 100–199)
HDL: 33 mg/dL — ABNORMAL LOW (ref >39)
LDL Calculated: 54 mg/dL (ref 0–99)
Triglycerides: 102 mg/dL (ref 0–149)
VLDL Cholesterol Cal: 20 mg/dL (ref 5–40)

## 2018-07-31 MED ORDER — FUROSEMIDE 20 MG PO TABS: 20.0000 mg | ORAL_TABLET | Freq: Every day | ORAL | 3 refills | Status: DC | PRN

## 2018-07-31 MED ORDER — LOSARTAN POTASSIUM 100 MG PO TABS: 100.0000 mg | ORAL_TABLET | Freq: Every day | ORAL | 3 refills | Status: DC

## 2018-07-31 MED ORDER — NITROGLYCERIN 0.4 MG SL SUBL: 0.4000 mg | SUBLINGUAL_TABLET | SUBLINGUAL | 3 refills | Status: DC | PRN

## 2018-07-31 MED ORDER — HYDROCHLOROTHIAZIDE 25 MG PO TABS: 25.0000 mg | ORAL_TABLET | Freq: Every day | ORAL | 3 refills | Status: DC

## 2018-07-31 MED ORDER — ATORVASTATIN CALCIUM 80 MG PO TABS: ORAL_TABLET | ORAL | 3 refills | Status: DC

## 2018-07-31 MED ORDER — ISOSORBIDE MONONITRATE ER 30 MG PO TB24: 30.0000 mg | ORAL_TABLET | Freq: Every day | ORAL | 3 refills | Status: DC

## 2018-07-31 MED ORDER — POTASSIUM CHLORIDE ER 10 MEQ PO TBCR: 10.0000 meq | EXTENDED_RELEASE_TABLET | Freq: Every day | ORAL | 6 refills | Status: DC | PRN

## 2018-07-31 MED ORDER — AMLODIPINE BESYLATE 5 MG PO TABS: 5.0000 mg | ORAL_TABLET | Freq: Every day | ORAL | 3 refills | Status: DC

## 2018-07-31 MED ORDER — METOPROLOL SUCCINATE ER 25 MG PO TB24: 25.0000 mg | ORAL_TABLET | Freq: Every day | ORAL | 3 refills | Status: DC

## 2018-07-31 NOTE — Progress Notes (Signed)
CARDIOLOGY OFFICE NOTE  Date:  07/31/2018    Hannah Vasquez Date of Birth: [birth year] Medical Record #[id number]  PCP:  Lucille Passy, MD  Cardiologist:  Marisa Cyphers    Chief Complaint  Patient presents with  . Coronary Artery Disease    6 month check. Seen for Dr. Marlou Porch    History of Present Illness: Hannah Vasquez is a 67 y.o. female who presents today for a follow up visit. Seen for Dr. Marlou Porch.   She has known CAD with prior history ofST elevation myocardial infarction on 01/01/17 stating that she had chest pressure that waxed and waned then returned, troponin was 0.16. She  received 2 stents to the LAD.  EF of 40-45%. Residual small ramus dz and jailed diag. Other issues as noted below.   Admitted back in January of this year for atypical chest pain, fatigue, SOB. Dr. Harl Bowie saw her in consultation. She hadshad ongoing fatigue since her stents were placed in May 2018. Change off of Brilinta to Plavix did not help. Now on Effient. Troponins were negative. X-ray negative. EKG with nonspecific ST-T wave changes. Beta-blocker was reduced by cardiology team. There was question of possible obstructive sleep apnea. She had been on Valtrex for shingles - and this was DC'd due to possible etiology of fatigue.Her ejection fraction has now normalized after echocardiogram repeat in July 2018. She has seen pulmonary earlier this year.   I last saw her in April - she was still not feeling well. She had seen Dr. Lamonte Sakai who wanted to consider CPX - I ordered this. This showed increased BP response and decreased exercise tolerance. Saw Dr. Marlou Porch in June - felt to be stable. DAPT changed to just aspirin therapy. Chronic decrease in energy.   Comes in today. Here alone. Needs refills today. Lots of issues. BP high here today. More swelling. Chronic fatigue. Continues to participate in rehab - maintenance program. Stays active - but no other real exercise other than rehab. BP  little labile. Tendency towards to being higher and higher at home and sounds like overall mostly high. Had one time at rehab where her BP was 100/50 - ?even accuracy. Have had ankle swelling. Not using her lasix or potassium - not really clear as to why - her swelling will typically go away overnight and she did not feel like then she should take. Probably getting too much salt.  Having more back issues and asking about restarting her injections. She was on HCTZ in the past. She is also asking about B12 - says she was told to not take - ?unclear why - would defer to her PCP - but if deficient ok to restart.   Past Medical History:  Diagnosis Date  . Allergy   . Anxiety   . CAD (coronary artery disease)   . Cataract   . Hemorrhoids   . Hypertension   . Mitral valve prolapse   . Osteoporosis   . Status post dilation of esophageal narrowing     Past Surgical History:  Procedure Laterality Date  . BASAL CELL CARCINOMA EXCISION  02/05/2016   Dr. Sarajane Jews, [facility]  . BLADDER SURGERY  11/15/1995   bladder neck suspension, urinary incontinence  . CARDIAC CATHETERIZATION  01/03/2017 DES LAD  . CATARACT EXTRACTION Left 04/30/2016   Dr. Tommy Rainwater, [place] N/A 01/03/2017   Procedure: Coronary Stent Intervention;  Surgeon: Nelva Bush, MD;  Location: [place] [facility];  Service: Cardiovascular;  Laterality: N/A;  . DENTAL SURGERY  06/20/2016   Tooth Implant; Dr. Kalman Shan  . INTRAVASCULAR ULTRASOUND/IVUS N/A 01/03/2017   Procedure: Intravascular Ultrasound/IVUS;  Surgeon: Nelva Bush, MD;  Location: [place] [facility];  Service: Cardiovascular;  Laterality: N/A;  . KNEE SURGERY  2003   torn meniscus  . LEFT HEART CATH AND CORONARY ANGIOGRAPHY N/A 01/03/2017   Procedure: Left Heart Cath and Coronary Angiography;  Surgeon: Nelva Bush, MD;  Location: [place] [facility];  Service: Cardiovascular;  Laterality: N/A;  . MOHS SURGERY Right 2017     right side of face  . ROTATOR CUFF REPAIR    . SHOULDER SURGERY Right 2005     Medications: Current Meds  Medication Sig  . acetaminophen (TYLENOL) 500 MG tablet Take 1 tablet (500 mg total) by mouth 2 (two) times daily.  Marland Kitchen albuterol (PROVENTIL HFA;VENTOLIN HFA) 108 (90 Base) MCG/ACT inhaler Inhale 1 puff into the lungs every 6 (six) hours as needed for wheezing or shortness of breath.  Marland Kitchen amLODipine (NORVASC) 5 MG tablet Take 1 tablet (5 mg total) by mouth daily.  Marland Kitchen aspirin EC 81 MG tablet Take 81 mg by mouth every morning.   Marland Kitchen atorvastatin (LIPITOR) 80 MG tablet TAKE 1 TABLET BY MOUTH ONCE A DAY AT 6PM  . Calcium-Magnesium-Vitamin D (CALCIUM 1200+D3 PO) Take 1,200 mg by mouth.  . Cholecalciferol (VITAMIN D3) 1000 UNITS CAPS Take 1 capsule by mouth every morning.   . fluticasone (FLONASE) 50 MCG/ACT nasal spray Place into both nostrils daily.  . furosemide (LASIX) 20 MG tablet Take 1 tablet (20 mg total) by mouth daily as needed for edema.  . gabapentin (NEURONTIN) 300 MG capsule Take 1 capsule (300 mg total) by mouth 3 (three) times daily.  . isosorbide mononitrate (IMDUR) 30 MG 24 hr tablet Take 1 tablet (30 mg total) by mouth daily.  Marland Kitchen loratadine (CLARITIN) 10 MG tablet Take 1 tablet (10 mg total) by mouth every morning.  Marland Kitchen losartan (COZAAR) 100 MG tablet Take 1 tablet (100 mg total) by mouth daily.  . metoprolol succinate (TOPROL-XL) 25 MG 24 hr tablet Take 1 tablet (25 mg total) by mouth daily.  . Multiple Vitamin (MULTIVITAMIN) tablet Take 1 tablet by mouth every morning.   . nitroGLYCERIN (NITROSTAT) 0.4 MG SL tablet Place 1 tablet (0.4 mg total) under the tongue every 5 (five) minutes as needed for chest pain.  . Omega-3 Fatty Acids (FISH OIL) 1200 MG CAPS Take 1 capsule by mouth every morning.   Marland Kitchen omeprazole (PRILOSEC) 40 MG capsule Take 40 mg by mouth daily.   Marland Kitchen oxyCODONE (OXY IR/ROXICODONE) 5 MG immediate release tablet Take 1 tablet (5 mg total) by mouth every 4 (four) hours  as needed for severe pain.  . potassium chloride (K-DUR) 10 MEQ tablet Take 1 tablet (10 mEq total) by mouth daily as needed. If taking Furosemide (Lasix)  . tiZANidine (ZANAFLEX) 4 MG tablet Take 1/5-1tid prn  . traMADol (ULTRAM) 50 MG tablet Take 1 tablet (50 mg total) by mouth 4 (four) times daily as needed.  . [DISCONTINUED] amLODipine (NORVASC) 5 MG tablet Take 1 tablet (5 mg total) by mouth daily.  . [DISCONTINUED] atorvastatin (LIPITOR) 80 MG tablet TAKE 1 TABLET BY MOUTH ONCE A DAY AT 6PM  . [DISCONTINUED] furosemide (LASIX) 20 MG tablet Take 1 tablet (20 mg total) by mouth daily as needed for edema.  . [DISCONTINUED] isosorbide mononitrate (IMDUR) 30 MG 24 hr tablet TAKE 1 TABLET BY  MOUTH ONCE A DAY  . [DISCONTINUED] losartan (COZAAR) 100 MG tablet TAKE 1 TABLET BY MOUTH ONCE DAILY  . [DISCONTINUED] metoprolol succinate (TOPROL-XL) 25 MG 24 hr tablet Take 1 tablet (25 mg total) by mouth daily.  . [DISCONTINUED] nitroGLYCERIN (NITROSTAT) 0.4 MG SL tablet Place 1 tablet (0.4 mg total) under the tongue every 5 (five) minutes as needed for chest pain.  . [DISCONTINUED] potassium chloride (K-DUR) 10 MEQ tablet Take 10 mEq daily as needed by mouth. If taking Furosemide (Lasix)     Allergies: Allergies  Allergen Reactions  . Bactroban [Mupirocin Calcium]     Facial redness and swelling  . Codeine     REACTION: nausea and vomiting  . Penicillins Nausea And Vomiting    REACTION: rash  . Valtrex [Valacyclovir Hcl] Swelling    Per pt causes redness    Social History: The patient  reports that she has never smoked. She has never used smokeless tobacco. She reports that she does not drink alcohol or use drugs.   Family History: The patient's family history includes Brain cancer in her father; Hypertension in her father; Mitral valve prolapse in her mother.   Review of Systems: Please see the history of present illness.   Otherwise, the review of systems is positive for none.   All  other systems are reviewed and negative.   Physical Exam: VS:  BP (!) 160/78 (BP Location: Left Arm, Patient Position: Sitting, Cuff Size: Normal)   Pulse (!) 59   Ht 5' 2"  (1.575 m)   Wt 147 lb 12.8 oz (67 kg)   LMP  (LMP Unknown)   SpO2 97% Comment: at rest  BMI 27.03 kg/m  .  BMI Body mass index is 27.03 kg/m.  Wt Readings from Last 3 Encounters:  07/31/18 147 lb 12.8 oz (67 kg)  07/26/18 146 lb 9.6 oz (66.5 kg)  06/20/18 147 lb 9.6 oz (67 kg)    General: Pleasant. Alert and in no acute distress. Neck: Supple, no JVD, carotid bruits, or masses noted.  Cardiac: Regular rate and rhythm. No murmurs, rubs, or gallops. No edema.  Respiratory:  Lungs are clear to auscultation bilaterally with normal work of breathing.  GI: Soft and nontender.  MS: No deformity or atrophy. Gait and ROM intact.  Skin: Warm and dry. Color is normal.  Neuro:  Strength and sensation are intact and no gross focal deficits noted.  Psych: Alert, appropriate and with normal affect.   LABORATORY DATA:  EKG:  EKG is not ordered today.  Lab Results  Component Value Date   WBC 8.6 07/26/2018   HGB 12.9 07/26/2018   HCT 37.8 07/26/2018   PLT 297.0 07/26/2018   GLUCOSE 119 (H) 07/26/2018   CHOL 117 12/05/2017   TRIG 76 12/05/2017   HDL 39 (L) 12/05/2017   LDLDIRECT 151.0 09/09/2011   LDLCALC 63 12/05/2017   ALT 18 07/26/2018   AST 24 07/26/2018   NA 144 07/26/2018   K 3.5 07/26/2018   CL 107 07/26/2018   CREATININE 0.74 07/26/2018   BUN 8 07/26/2018   CO2 28 07/26/2018   TSH 2.00 07/26/2018   INR 1.12 01/02/2017   HGBA1C 6.1 (H) 01/02/2017     BNP (last 3 results) Recent Labs    08/21/17 2058  BNP 31.2    ProBNP (last 3 results) Recent Labs    12/05/17 0944 07/26/18 1053  PROBNP 91 21.0     Other Studies Reviewed Today:  CPX 12/2017 Conclusion: The  interpretation of this test is limited due to submaximal effort during the exercise. Based on available data, exercise testing  with gas exchange does not indicate cardiopulmonary limitation. There was an early and significant hypertensive response to exercise, with elevated VE/VCO2 slope.    Test, report and preliminary impression by: Landis Martins, MS, ACSM-RCEP 12/15/2017 3:42 PM  Exercise testing stopped early due to severe HTN response to exercise and patient's imbalance. Available data shows a normal pVO2 when compared to matched subjects. The VE/VCO2 slope (a surrogate for dead space ventilation and increased pulmonary pressures) was elevated and likely represents diastolic dysfunction in the setting of a severe HTN response to exercise. No other cardiopulmonary abnormality was noted.   Glori Bickers, MD  3:07 PM   Cath 01/03/17 Conclusions: 1. Significant 2-vessel coronary artery disease, including diffuse LAD disease with up to 99% stenosis in the mid portion and TIMI-2 flow, as well as 90% small ramus lesion and 40% proximal/mid LCx disease. 2. Moderately reduced LV contraction (LVEF 35-45%) with anterior and apical akinesis. 3. Moderately elevated left ventricular filling pressure. 4. Successful IVUS-guided PCI to the mid and distal LAD with placement of 2 overlapping Resolute Onyx drug-eluting stents with 0% residual stenosis and TIMI-3 flow.  Recommendations: 1. Dual antiplatelet therapy with aspirin and ticagrelor for at least 12 months, ideally longer. 2. Aggressive secondary prevention. 3. Medical therapy for small ramus intermedius and jailed diagonal branch, as PCI would be challenging given small vessel size. 4. Diuresis and titration of evidence-based heart failure therapy, given moderately reduced LV contraction with elevated filling pressure.   ECHO 02/2017 - EF 65%   Echocardiogram 01/03/17: -EF 45%  ASSESSMENT AND PLAN:  1. CAD with prior Non-ST elevation myocardial infarction 01/03/17 - LAD stents  2, residual ramus disease, jailed diagonal. She is no longer on her DAPT -  Effient was stopped earlier this year. Remains just on Aspirin. No active chest pain noted. Needs to continue with CV risk factor modification.   2. HLD - lab today - she remains on statin  3. Chronic fatigue - multifactorial  4. HTN - not controlled - adding back HCTZ today. Recheck lab in 2 weeks. She will continue to monitor BP at home. Goal is less than 887 systolic. She will be in touch with [place] if fails to meet goal.   5. Ankle swelling - most likely salt related - not really using her Lasix/potassium - starting HCTZ today. Recent lab noted.   Current medicines are reviewed with the patient today.  The patient does not have concerns regarding medicines other than what has been noted above.  The following changes have been made:  See above.  Labs/ tests ordered today include:    Orders Placed This Encounter  Procedures  . Hepatic function panel  . Lipid panel  . Basic metabolic panel     Disposition:   FU with Dr. Marlou Porch in 6 months.    Patient is agreeable to this plan and will call if any problems develop in the interim.   SignedTruitt Merle, NP  07/31/2018 9:10 AM  [facility] [address] [place], [place]  [address] Phone: [phone] Fax: [phone]

## 2018-07-31 NOTE — Patient Instructions (Addendum)
We will be checking the following labs today - HPF and Lipids  BMET in 2 to 3 weeks  If you have labs (blood work) drawn today and your tests are completely normal, you will receive your results only by: Marland Kitchen MyChart Message (if you have MyChart) OR . A paper copy in the mail If you have any lab test that is abnormal or we need to change your treatment, we will call you to review the results.   Medication Instructions:    Continue with your current medicines. BUT  I am adding HCTZ 25 mg to take one a day - this is for your BP  I have refilled your medicines today   If you need a refill on your cardiac medications before your next appointment, please call your pharmacy.     Testing/Procedures To Be Arranged:  N/A  Follow-Up:   See Dr. Marlou Porch in 6 months.     At [facility], you and your health needs are our priority.  As part of our continuing mission to provide you with exceptional heart care, we have created designated Provider Care Teams.  These Care Teams include your primary Cardiologist (physician) and Advanced Practice Providers (APPs -  Physician Assistants and Nurse Practitioners) who all work together to provide you with the care you need, when you need it.  Special Instructions:  Marland Kitchen Monitor your blood pressure - goal is less than 157 systolic or less consistently . Restrict your salt as best you can . Ok to go see your back doctor about injections  Call the [place] office at [phone] if you have any questions, problems or concerns.

## 2018-08-01 NOTE — Telephone Encounter (Signed)
TA-I talked to pt/we discussed her lab results/she is aware of cards recommendation to rechec LFT's in 4-6 weeks and says that she is scheduled for a lab visit in a couple of weeks/I told her to see if she needed to still have the LFT's done a month following that and she agreed  She did say that the card said it was ok to take an oral B-12/she wanted to get your consent as well/plz advise/thx dmf

## 2018-08-01 NOTE — Telephone Encounter (Signed)
Yes okay to keep her lab appointment as scheduled and to take oral Vitamin B12.

## 2018-08-02 ENCOUNTER — Telehealth: Payer: Self-pay | Admitting: Nurse Practitioner

## 2018-08-02 ENCOUNTER — Encounter (HOSPITAL_COMMUNITY)
Admission: RE | Admit: 2018-08-02 | Discharge: 2018-08-02 | Disposition: A | Discharge status: Home / Self Care | Payer: Self-pay | Source: Ambulatory Visit | Attending: Cardiology | Admitting: Cardiology

## 2018-08-02 NOTE — Telephone Encounter (Signed)
Patient returned call for lab results.  

## 2018-08-03 ENCOUNTER — Other Ambulatory Visit: Payer: Self-pay | Admitting: *Deleted

## 2018-08-03 DIAGNOSIS — I2510 Atherosclerotic heart disease of native coronary artery without angina pectoris: Secondary | ICD-10-CM

## 2018-08-03 DIAGNOSIS — R5383 Other fatigue: Secondary | ICD-10-CM

## 2018-08-03 DIAGNOSIS — R748 Abnormal levels of other serum enzymes: Secondary | ICD-10-CM

## 2018-08-03 NOTE — Progress Notes (Signed)
Noted  

## 2018-08-04 ENCOUNTER — Telehealth: Payer: Self-pay | Admitting: Family Medicine

## 2018-08-04 ENCOUNTER — Encounter (HOSPITAL_COMMUNITY)
Admission: RE | Admit: 2018-08-04 | Discharge: 2018-08-04 | Disposition: A | Discharge status: Home / Self Care | Payer: Self-pay | Source: Ambulatory Visit | Attending: Cardiology | Admitting: Cardiology

## 2018-08-04 ENCOUNTER — Other Ambulatory Visit: Payer: Self-pay | Admitting: Family Medicine

## 2018-08-04 NOTE — Telephone Encounter (Signed)
Copied from [place] [id number]. Topic: Quick Communication - Rx Refill/Question >> Aug 04, 2018  2:07 PM Margot Ables wrote: Medication: traMADol (ULTRAM) 50 MG tablet  - pt states she has 9 tablets left - she doesn't want to run out over the holiday - takes 3/day   Has the patient contacted their pharmacy? Yes - notes she called pharmacy and they were supposed to request  Preferred Pharmacy (with phone number or street name): [place] [place] [facility] [place] [phone] (Phone) [phone] (Fax)

## 2018-08-05 NOTE — Telephone Encounter (Signed)
Ok to fill 

## 2018-08-07 ENCOUNTER — Encounter (HOSPITAL_COMMUNITY)
Admission: RE | Admit: 2018-08-07 | Discharge: 2018-08-07 | Disposition: A | Discharge status: Home / Self Care | Payer: Self-pay | Source: Ambulatory Visit | Attending: Cardiology | Admitting: Cardiology

## 2018-08-07 NOTE — Telephone Encounter (Signed)
Pt aware that #[phone] was sent in to pharmacy on 12.22.19/she is going to call them and will let us know if there are any issues/thx dmf

## 2018-08-11 ENCOUNTER — Encounter (HOSPITAL_COMMUNITY): Payer: Self-pay

## 2018-08-11 ENCOUNTER — Other Ambulatory Visit: Payer: PPO

## 2018-08-14 ENCOUNTER — Other Ambulatory Visit: Payer: PPO

## 2018-08-14 ENCOUNTER — Encounter (HOSPITAL_COMMUNITY)
Admission: RE | Admit: 2018-08-14 | Discharge: 2018-08-14 | Disposition: A | Discharge status: Home / Self Care | Payer: Self-pay | Source: Ambulatory Visit | Attending: Cardiology | Admitting: Cardiology

## 2018-08-15 ENCOUNTER — Ambulatory Visit (INDEPENDENT_AMBULATORY_CARE_PROVIDER_SITE_OTHER): Payer: PPO | Admitting: Family Medicine

## 2018-08-15 ENCOUNTER — Encounter: Payer: Self-pay | Admitting: Family Medicine

## 2018-08-15 VITALS — BP 124/60 | HR 60 | Temp 98.2°F | Ht 62.0 in | Wt 143.8 lb

## 2018-08-15 DIAGNOSIS — N3000 Acute cystitis without hematuria: Secondary | ICD-10-CM

## 2018-08-15 DIAGNOSIS — R300 Dysuria: Secondary | ICD-10-CM

## 2018-08-15 LAB — POCT URINALYSIS DIPSTICK
Bilirubin, UA: NEGATIVE
Glucose, UA: NEGATIVE
KETONES UA: NEGATIVE
PH UA: 6 (ref 5.0–8.0)
PROTEIN UA: NEGATIVE
RBC UA: NEGATIVE
Spec Grav, UA: 1.015 (ref 1.010–1.025)
UROBILINOGEN UA: 0.2 U/dL

## 2018-08-15 MED ORDER — CIPROFLOXACIN HCL 500 MG PO TABS: 500.0000 mg | ORAL_TABLET | Freq: Two times a day (BID) | ORAL | 0 refills | Status: DC

## 2018-08-15 MED ORDER — GABAPENTIN 300 MG PO CAPS: 300.0000 mg | ORAL_CAPSULE | Freq: Three times a day (TID) | ORAL | 3 refills | Status: DC

## 2018-08-15 NOTE — Patient Instructions (Signed)
Great to see you.  Please take cipro 500 mg twice daily x 3 days. We will call you with your urine culture.  Have a happy new year!   Urinary Tract Infection, Adult A urinary tract infection (UTI) is an infection of any part of the urinary tract. The urinary tract includes:  The kidneys.  The ureters.  The bladder.  The urethra. These organs make, store, and get rid of pee (urine) in the body. What are the causes? This is caused by germs (bacteria) in your genital area. These germs grow and cause swelling (inflammation) of your urinary tract. What increases the risk? You are more likely to develop this condition if:  You have a small, thin tube (catheter) to drain pee.  You cannot control when you pee or poop (incontinence).  You are female, and: ? You use these methods to prevent pregnancy: ? A medicine that kills sperm (spermicide). ? A device that blocks sperm (diaphragm). ? You have low levels of a female hormone (estrogen). ? You are pregnant.  You have genes that add to your risk.  You are sexually active.  You take antibiotic medicines.  You have trouble peeing because of: ? A prostate that is bigger than normal, if you are female. ? A blockage in the part of your body that drains pee from the bladder (urethra). ? A kidney stone. ? A nerve condition that affects your bladder (neurogenic bladder). ? Not getting enough to drink. ? Not peeing often enough.  You have other conditions, such as: ? Diabetes. ? A weak disease-fighting system (immune system). ? Sickle cell disease. ? Gout. ? Injury of the spine. What are the signs or symptoms? Symptoms of this condition include:  Needing to pee right away (urgently).  Peeing often.  Peeing small amounts often.  Pain or burning when peeing.  Blood in the pee.  Pee that smells bad or not like normal.  Trouble peeing.  Pee that is cloudy.  Fluid coming from the vagina, if you are female.  Pain in  the belly or lower back. Other symptoms include:  Throwing up (vomiting).  No urge to eat.  Feeling mixed up (confused).  Being tired and grouchy (irritable).  A fever.  Watery poop (diarrhea). How is this treated? This condition may be treated with:  Antibiotic medicine.  Other medicines.  Drinking enough water. Follow these instructions at home:  Medicines  Take over-the-counter and prescription medicines only as told by your doctor.  If you were prescribed an antibiotic medicine, take it as told by your doctor. Do not stop taking it even if you start to feel better. General instructions  Make sure you: ? Pee until your bladder is empty. ? Do not hold pee for a long time. ? Empty your bladder after sex. ? Wipe from front to back after pooping if you are a female. Use each tissue one time when you wipe.  Drink enough fluid to keep your pee pale yellow.  Keep all follow-up visits as told by your doctor. This is important. Contact a doctor if:  You do not get better after 1-2 days.  Your symptoms go away and then come back. Get help right away if:  You have very bad back pain.  You have very bad pain in your lower belly.  You have a fever.  You are sick to your stomach (nauseous).  You are throwing up. Summary  A urinary tract infection (UTI) is an infection of any  part of the urinary tract.  This condition is caused by germs in your genital area.  There are many risk factors for a UTI. These include having a small, thin tube to drain pee and not being able to control when you pee or poop.  Treatment includes antibiotic medicines for germs.  Drink enough fluid to keep your pee pale yellow. This information is not intended to replace advice given to you by your health care provider. Make sure you discuss any questions you have with your health care provider. Document Released: 01/19/2008 Document Revised: 02/09/2018 Document Reviewed:  02/09/2018 Elsevier Interactive Patient Education  2019 [employer].

## 2018-08-15 NOTE — Progress Notes (Signed)
SUBJECTIVE: Hannah Vasquez is a 67 y.o. female who complains of urinary frequency, urgency,  fatigue and dysuria x 7 days, without flank pain ( but she is not completely sure as she has chronic back pain), fever, chills, or abnormal vaginal discharge or bleeding.   Current Outpatient Medications on File Prior to Visit  Medication Sig Dispense Refill  . acetaminophen (TYLENOL) 500 MG tablet Take 1 tablet (500 mg total) by mouth 2 (two) times daily. 60 tablet 0  . albuterol (PROVENTIL HFA;VENTOLIN HFA) 108 (90 Base) MCG/ACT inhaler Inhale 1 puff into the lungs every 6 (six) hours as needed for wheezing or shortness of breath.    Marland Kitchen amLODipine (NORVASC) 5 MG tablet Take 1 tablet (5 mg total) by mouth daily. 90 tablet 3  . aspirin EC 81 MG tablet Take 81 mg by mouth every morning.     Marland Kitchen atorvastatin (LIPITOR) 80 MG tablet TAKE 1 TABLET BY MOUTH ONCE A DAY AT 6PM 90 tablet 3  . Calcium-Magnesium-Vitamin D (CALCIUM 1200+D3 PO) Take 1,200 mg by mouth.    . Cholecalciferol (VITAMIN D3) 1000 UNITS CAPS Take 1 capsule by mouth every morning.     . fluticasone (FLONASE) 50 MCG/ACT nasal spray Place into both nostrils daily.    . furosemide (LASIX) 20 MG tablet Take 1 tablet (20 mg total) by mouth daily as needed for edema. 30 tablet 3  . hydrochlorothiazide (HYDRODIURIL) 25 MG tablet Take 1 tablet (25 mg total) by mouth daily. 90 tablet 3  . isosorbide mononitrate (IMDUR) 30 MG 24 hr tablet Take 1 tablet (30 mg total) by mouth daily. 90 tablet 3  . loratadine (CLARITIN) 10 MG tablet Take 1 tablet (10 mg total) by mouth every morning. 30 tablet 2  . losartan (COZAAR) 100 MG tablet Take 1 tablet (100 mg total) by mouth daily. 90 tablet 3  . metoprolol succinate (TOPROL-XL) 25 MG 24 hr tablet Take 1 tablet (25 mg total) by mouth daily. 90 tablet 3  . Multiple Vitamin (MULTIVITAMIN) tablet Take 1 tablet by mouth every morning.     . nitroGLYCERIN (NITROSTAT) 0.4 MG SL tablet Place 1 tablet (0.4 mg total) under the  tongue every 5 (five) minutes as needed for chest pain. 25 tablet 3  . Omega-3 Fatty Acids (FISH OIL) 1200 MG CAPS Take 1 capsule by mouth every morning.     Marland Kitchen omeprazole (PRILOSEC) 40 MG capsule Take 40 mg by mouth daily.     Marland Kitchen oxyCODONE (OXY IR/ROXICODONE) 5 MG immediate release tablet Take 1 tablet (5 mg total) by mouth every 4 (four) hours as needed for severe pain. 30 tablet 0  . potassium chloride (K-DUR) 10 MEQ tablet Take 1 tablet (10 mEq total) by mouth daily as needed. If taking Furosemide (Lasix) 30 tablet 6  . tiZANidine (ZANAFLEX) 4 MG tablet Take 1/5-1tid prn    . traMADol (ULTRAM) 50 MG tablet TAKE 1 TABLET BY MOUTH 4 TIMES DAILY AS NEEDED 90 tablet 0   No current facility-administered medications on file prior to visit.     Allergies  Allergen Reactions  . Bactroban [Mupirocin Calcium]     Facial redness and swelling  . Codeine     REACTION: nausea and vomiting  . Penicillins Nausea And Vomiting    REACTION: rash  . Valtrex [Valacyclovir Hcl] Swelling    Per pt causes redness    Past Medical History:  Diagnosis Date  . Allergy   . Anxiety   . CAD (coronary  artery disease)   . Cataract   . Hemorrhoids   . Hypertension   . Mitral valve prolapse   . Osteoporosis   . Status post dilation of esophageal narrowing     Past Surgical History:  Procedure Laterality Date  . BASAL CELL CARCINOMA EXCISION  02/05/2016   Dr. Sarajane Jews, [facility]  . BLADDER SURGERY  11/15/1995   bladder neck suspension, urinary incontinence  . CARDIAC CATHETERIZATION  01/03/2017 DES LAD  . CATARACT EXTRACTION Left 04/30/2016   Dr. Tommy Rainwater, [place] N/A 01/03/2017   Procedure: Coronary Stent Intervention;  Surgeon: Nelva Bush, MD;  Location: [place] [facility];  Service: Cardiovascular;  Laterality: N/A;  . DENTAL SURGERY  06/20/2016   Tooth Implant; Dr. Kalman Shan  . INTRAVASCULAR ULTRASOUND/IVUS N/A 01/03/2017   Procedure: Intravascular  Ultrasound/IVUS;  Surgeon: Nelva Bush, MD;  Location: [place] [facility];  Service: Cardiovascular;  Laterality: N/A;  . KNEE SURGERY  2003   torn meniscus  . LEFT HEART CATH AND CORONARY ANGIOGRAPHY N/A 01/03/2017   Procedure: Left Heart Cath and Coronary Angiography;  Surgeon: Nelva Bush, MD;  Location: [place] [facility];  Service: Cardiovascular;  Laterality: N/A;  . MOHS SURGERY Right 2017   right side of face  . ROTATOR CUFF REPAIR    . SHOULDER SURGERY Right 2005    Family History  Problem Relation Age of Onset  . Mitral valve prolapse Mother   . Hypertension Father   . Brain cancer Father     Social History   Socioeconomic History  . Marital status: Divorced    Spouse name: Not on file  . Number of children: 2  . Years of education: Not on file  . Highest education level: Not on file  Occupational History  . 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 Financial resource strain: Not on file  . Food insecurity:    Worry: Not on file    Inability: Not on file  . Transportation needs:    Medical: Not on file    Non-medical: Not on file  Tobacco Use  . Smoking status: Never Smoker  . Smokeless tobacco: Never Used  Substance and Sexual Activity  . Alcohol use: No    Alcohol/week: 0.0 standard drinks  . Drug use: No  . Sexual activity: Not Currently  Lifestyle  . Physical activity:    Days per week: Not on file    Minutes per session: Not on file  . Stress: Not on file  Relationships  . Social connections:    Talks on phone: Not on file    Gets together: Not on file    Attends religious service: Not on file    Active member of club or organization: Not on file    Attends meetings of clubs or organizations: Not on file    Relationship status: Not on file  . Intimate partner violence:    Fear of current or ex partner: Not on file    Emotionally abused: Not on file    Physically abused: Not on file    Forced  sexual activity: Not on file  Other Topics Concern  . Not on file  Social History Narrative   Has a living will.   HPOA- daughter.   She would desire CPR.  Would desire life support if necessary and not futile.         The PMH, PSH, Social History, Family  History, Medications, and allergies have been reviewed in [facility], and have been updated if relevant.  OBJECTIVE:   BP 124/60 (BP Location: Left Arm, Patient Position: Sitting, Cuff Size: Normal)   Pulse 60   Temp 98.2 F (36.8 C) (Oral)   Ht 5' 2"  (1.575 m)   Wt 143 lb 12.8 oz (65.2 kg)   LMP  (LMP Unknown)   SpO2 97%   BMI 26.30 kg/m  Appears well, in no apparent distress.  Vital signs are normal. The abdomen is soft without tenderness, guarding, mass, rebound or organomegaly. No CVA tenderness or inguinal adenopathy noted. Urine dipstick shows positive for nitrates and positive for leukocytes.    ASSESSMENT: UTI complicated  PLAN: Treatment per orders -PCN and sulfa allergic. Given multiple abx allergies, send urine for cx.  I would like to cover potential pyelonephritis- eRx sent for cipro 500 mg twice daily x 3 days,  also push fluids, may use Pyridium OTC prn. Call or return to clinic prn if these symptoms worsen or fail to improve as anticipated.

## 2018-08-18 ENCOUNTER — Encounter (HOSPITAL_COMMUNITY): Payer: Self-pay | Attending: Cardiology

## 2018-08-18 ENCOUNTER — Telehealth: Payer: Self-pay

## 2018-08-18 DIAGNOSIS — R0609 Other forms of dyspnea: Secondary | ICD-10-CM | POA: Insufficient documentation

## 2018-08-18 DIAGNOSIS — I2583 Coronary atherosclerosis due to lipid rich plaque: Secondary | ICD-10-CM | POA: Insufficient documentation

## 2018-08-18 DIAGNOSIS — I10 Essential (primary) hypertension: Secondary | ICD-10-CM | POA: Insufficient documentation

## 2018-08-18 DIAGNOSIS — I2510 Atherosclerotic heart disease of native coronary artery without angina pectoris: Secondary | ICD-10-CM | POA: Insufficient documentation

## 2018-08-18 DIAGNOSIS — R5383 Other fatigue: Secondary | ICD-10-CM | POA: Insufficient documentation

## 2018-08-18 LAB — URINE CULTURE
MICRO NUMBER:: 91556641
SPECIMEN QUALITY:: ADEQUATE

## 2018-08-18 MED ORDER — CIPROFLOXACIN HCL 500 MG PO TABS: 500.0000 mg | ORAL_TABLET | Freq: Two times a day (BID) | ORAL | 0 refills | Status: DC

## 2018-08-18 NOTE — Telephone Encounter (Signed)
Pt aware/thx dmf

## 2018-08-18 NOTE — Telephone Encounter (Signed)
TA-Patient states that urinary Sx have improved but are not completely gone and is wondering if need to retreat?  I told her that I would call her back with your response/plz advise/thx dmf

## 2018-08-18 NOTE — Telephone Encounter (Signed)
-----   Message from Lucille Passy, MD sent at 08/18/2018  4:58 AM EST ----- See below- Please let pt know urine culture showed bacteria sensitive to cipro, which she is taking. How is she feeling?

## 2018-08-18 NOTE — Telephone Encounter (Signed)
Yes let's extend course by another 3 days.  eRx sent.

## 2018-08-21 ENCOUNTER — Other Ambulatory Visit: Payer: PPO | Admitting: *Deleted

## 2018-08-21 ENCOUNTER — Encounter (HOSPITAL_COMMUNITY): Payer: Self-pay

## 2018-08-21 ENCOUNTER — Telehealth: Payer: Self-pay | Admitting: Nurse Practitioner

## 2018-08-21 ENCOUNTER — Other Ambulatory Visit: Payer: Self-pay | Admitting: *Deleted

## 2018-08-21 DIAGNOSIS — I2510 Atherosclerotic heart disease of native coronary artery without angina pectoris: Secondary | ICD-10-CM

## 2018-08-21 DIAGNOSIS — E7800 Pure hypercholesterolemia, unspecified: Secondary | ICD-10-CM

## 2018-08-21 DIAGNOSIS — R5383 Other fatigue: Secondary | ICD-10-CM

## 2018-08-21 DIAGNOSIS — I10 Essential (primary) hypertension: Secondary | ICD-10-CM

## 2018-08-21 DIAGNOSIS — E876 Hypokalemia: Secondary | ICD-10-CM

## 2018-08-21 LAB — BASIC METABOLIC PANEL
BUN/Creatinine Ratio: 15 (ref 12–28)
BUN: 17 mg/dL (ref 8–27)
CO2: 27 mmol/L (ref 20–29)
Calcium: 9.8 mg/dL (ref 8.7–10.3)
Chloride: 95 mmol/L — ABNORMAL LOW (ref 96–106)
Creatinine, Ser: 1.15 mg/dL — ABNORMAL HIGH (ref 0.57–1.00)
GFR calc Af Amer: 56 mL/min/{1.73_m2} — ABNORMAL LOW (ref >59)
GFR calc non Af Amer: 49 mL/min/{1.73_m2} — ABNORMAL LOW (ref >59)
Glucose: 120 mg/dL — ABNORMAL HIGH (ref 65–99)
Potassium: 2.9 mmol/L — CL (ref 3.5–5.2)
Sodium: 139 mmol/L (ref 134–144)

## 2018-08-21 MED ORDER — POTASSIUM CHLORIDE ER 10 MEQ PO TBCR: 20.0000 meq | EXTENDED_RELEASE_TABLET | Freq: Every day | ORAL | 6 refills | Status: DC

## 2018-08-21 NOTE — Telephone Encounter (Signed)
Unsure of the question or concern related to this message.  Will ask Sunday Spillers

## 2018-08-21 NOTE — Telephone Encounter (Signed)
Left message for patient to call back to discuss her low potassium and the needed changes to her medications.

## 2018-08-21 NOTE — Telephone Encounter (Signed)
Notes recorded by Tamsen Snider on 08/21/2018 at 4:55 PM EST S/w pt is aware of lab results and agreeable to treatment plan. Will take 3 tablets now (30 meq) 3 tablets at bedtime (30 meq) than two tablets (20 meq) daily. Medication list updated. Pt will come in on Friday for repeat bmet. Appt made, orders in and linked.

## 2018-08-21 NOTE — Telephone Encounter (Signed)
Notes recorded by Burtis Junes, NP on 08/21/2018 at 4:42 PM EST Please call - potassium level quite low - HCTZ was just started- she needs to take potassium - has 69mq tablets on her list - take 3 tablets now and repeat at bedtime, then 2 tablets daily.  Recheck BMET Friday.  She does not need to take any Lasix.

## 2018-08-21 NOTE — Telephone Encounter (Signed)
Critical Lab:  Potassium 2.9  Results per Dan Europe at [facility]: [phone]

## 2018-08-22 ENCOUNTER — Telehealth: Payer: Self-pay | Admitting: Nurse Practitioner

## 2018-08-22 NOTE — Telephone Encounter (Signed)
  Patient is calling regarding yesterday's conversation. Please call back.

## 2018-08-22 NOTE — Telephone Encounter (Signed)
Pt is calling in today due to some confusion over yesterday's phone conversation. After pt was given instructions about medications,  from previous lab another person called pt. from this office to report the same thing.  Pt did not get a follow up phone call last night  Regarding pt being called back.   Pt was unsure of what to do but followed instructions per Cecille Rubin.

## 2018-08-23 ENCOUNTER — Encounter (HOSPITAL_COMMUNITY): Payer: Self-pay

## 2018-08-23 DIAGNOSIS — M47816 Spondylosis without myelopathy or radiculopathy, lumbar region: Secondary | ICD-10-CM | POA: Diagnosis not present

## 2018-08-23 DIAGNOSIS — M545 Low back pain: Secondary | ICD-10-CM | POA: Diagnosis not present

## 2018-08-25 ENCOUNTER — Other Ambulatory Visit: Payer: PPO | Admitting: *Deleted

## 2018-08-25 ENCOUNTER — Encounter (HOSPITAL_COMMUNITY): Payer: Self-pay

## 2018-08-25 DIAGNOSIS — E876 Hypokalemia: Secondary | ICD-10-CM | POA: Diagnosis not present

## 2018-08-25 LAB — BASIC METABOLIC PANEL
BUN/Creatinine Ratio: 23 (ref 12–28)
BUN: 22 mg/dL (ref 8–27)
CO2: 24 mmol/L (ref 20–29)
Calcium: 9.7 mg/dL (ref 8.7–10.3)
Chloride: 103 mmol/L (ref 96–106)
Creatinine, Ser: 0.96 mg/dL (ref 0.57–1.00)
GFR calc Af Amer: 70 mL/min/{1.73_m2} (ref >59)
GFR calc non Af Amer: 61 mL/min/{1.73_m2} (ref >59)
Glucose: 125 mg/dL — ABNORMAL HIGH (ref 65–99)
Potassium: 3.5 mmol/L (ref 3.5–5.2)
Sodium: 143 mmol/L (ref 134–144)

## 2018-08-28 ENCOUNTER — Other Ambulatory Visit: Payer: Self-pay | Admitting: *Deleted

## 2018-08-28 ENCOUNTER — Encounter (HOSPITAL_COMMUNITY): Payer: Self-pay

## 2018-08-28 DIAGNOSIS — E876 Hypokalemia: Secondary | ICD-10-CM

## 2018-08-28 DIAGNOSIS — R748 Abnormal levels of other serum enzymes: Secondary | ICD-10-CM

## 2018-08-28 MED ORDER — VITAMIN B-12 100 MCG PO TABS: 100.0000 ug | ORAL_TABLET | Freq: Every day | ORAL | Status: DC

## 2018-08-28 MED ORDER — POTASSIUM CHLORIDE CRYS ER 20 MEQ PO TBCR: 20.0000 meq | EXTENDED_RELEASE_TABLET | Freq: Every day | ORAL | 3 refills | Status: DC

## 2018-08-30 ENCOUNTER — Encounter (HOSPITAL_COMMUNITY): Payer: Self-pay

## 2018-09-01 ENCOUNTER — Encounter (HOSPITAL_COMMUNITY): Payer: Self-pay

## 2018-09-04 ENCOUNTER — Ambulatory Visit: Payer: PPO | Admitting: Family Medicine

## 2018-09-04 ENCOUNTER — Encounter (HOSPITAL_COMMUNITY): Payer: Self-pay

## 2018-09-05 ENCOUNTER — Ambulatory Visit (INDEPENDENT_AMBULATORY_CARE_PROVIDER_SITE_OTHER): Payer: PPO | Admitting: Family Medicine

## 2018-09-05 ENCOUNTER — Encounter: Payer: Self-pay | Admitting: Family Medicine

## 2018-09-05 VITALS — BP 130/68 | HR 65 | Temp 98.6°F | Ht 62.0 in | Wt 143.8 lb

## 2018-09-05 DIAGNOSIS — Z1239 Encounter for other screening for malignant neoplasm of breast: Secondary | ICD-10-CM | POA: Diagnosis not present

## 2018-09-05 DIAGNOSIS — R300 Dysuria: Secondary | ICD-10-CM

## 2018-09-05 DIAGNOSIS — N390 Urinary tract infection, site not specified: Secondary | ICD-10-CM | POA: Diagnosis not present

## 2018-09-05 DIAGNOSIS — B9620 Unspecified Escherichia coli [E. coli] as the cause of diseases classified elsewhere: Secondary | ICD-10-CM | POA: Diagnosis not present

## 2018-09-05 LAB — POCT URINALYSIS DIPSTICK
BILIRUBIN UA: NEGATIVE
Glucose, UA: NEGATIVE
Ketones, UA: NEGATIVE
LEUKOCYTES UA: NEGATIVE
Nitrite, UA: NEGATIVE
Protein, UA: NEGATIVE
RBC UA: NEGATIVE
SPEC GRAV UA: 1.015 (ref 1.010–1.025)
UROBILINOGEN UA: 0.2 U/dL
pH, UA: 7 (ref 5.0–8.0)

## 2018-09-05 MED ORDER — ALBUTEROL SULFATE HFA 108 (90 BASE) MCG/ACT IN AERS: 1.0000 | INHALATION_SPRAY | Freq: Four times a day (QID) | RESPIRATORY_TRACT | 99 refills | Status: DC | PRN

## 2018-09-05 NOTE — Progress Notes (Signed)
Subjective:   Patient ID: Hannah Vasquez, female    DOB: Sep 27, 1950, 68 y.o.   MRN: [id number]  Hannah Vasquez is a pleasant 68 y.o. year old female who presents to clinic today with Follow-up (Patient is here today to F/U with urinary Sx.  On 12.31.19 pt was Tx with 3d of Cipro for UTI.  Culture showed bacteria E. Coli which proved sensitive to the chosen abx.  On 1.3.20 pt stated that she was still symptomatic so she was retreated.  Today she is here becasue she is still having Sx.)  on 09/05/2018  HPI:  Here for follow up.  Was treated for UTI symptoms on 08/15/18 with Cipro 500 mg twice daily x 3 days. Urine cx great E coli which was sensitive to cipro.  She then called [place] on 08/18/18 stating that she was feeling better but symptoms were not completely resolved so we extended her course of cipro.  She is still having some urinary frequency but no burning.  She is still tired as well.  Current Outpatient Medications on File Prior to Visit  Medication Sig Dispense Refill  . acetaminophen (TYLENOL) 500 MG tablet Take 1 tablet (500 mg total) by mouth 2 (two) times daily. 60 tablet 0  . albuterol (PROVENTIL HFA;VENTOLIN HFA) 108 (90 Base) MCG/ACT inhaler Inhale 1 puff into the lungs every 6 (six) hours as needed for wheezing or shortness of breath.    Marland Kitchen amLODipine (NORVASC) 5 MG tablet Take 1 tablet (5 mg total) by mouth daily. 90 tablet 3  . aspirin EC 81 MG tablet Take 81 mg by mouth every morning.     Marland Kitchen atorvastatin (LIPITOR) 80 MG tablet TAKE 1 TABLET BY MOUTH ONCE A DAY AT 6PM 90 tablet 3  . Calcium-Magnesium-Vitamin D (CALCIUM 1200+D3 PO) Take 1,200 mg by mouth.    . Cholecalciferol (VITAMIN D3) 1000 UNITS CAPS Take 1 capsule by mouth every morning.     . fluticasone (FLONASE) 50 MCG/ACT nasal spray Place into both nostrils daily.    . furosemide (LASIX) 20 MG tablet Take 1 tablet (20 mg total) by mouth daily as needed for edema. 30 tablet 3  . gabapentin (NEURONTIN) 300 MG capsule  Take 1 capsule (300 mg total) by mouth 3 (three) times daily. 90 capsule 3  . hydrochlorothiazide (HYDRODIURIL) 25 MG tablet Take 1 tablet (25 mg total) by mouth daily. 90 tablet 3  . isosorbide mononitrate (IMDUR) 30 MG 24 hr tablet Take 1 tablet (30 mg total) by mouth daily. 90 tablet 3  . loratadine (CLARITIN) 10 MG tablet Take 1 tablet (10 mg total) by mouth every morning. 30 tablet 2  . losartan (COZAAR) 100 MG tablet Take 1 tablet (100 mg total) by mouth daily. 90 tablet 3  . metoprolol succinate (TOPROL-XL) 25 MG 24 hr tablet Take 1 tablet (25 mg total) by mouth daily. 90 tablet 3  . Multiple Vitamin (MULTIVITAMIN) tablet Take 1 tablet by mouth every morning.     . nitroGLYCERIN (NITROSTAT) 0.4 MG SL tablet Place 1 tablet (0.4 mg total) under the tongue every 5 (five) minutes as needed for chest pain. 25 tablet 3  . Omega-3 Fatty Acids (FISH OIL) 1200 MG CAPS Take 1 capsule by mouth every morning.     Marland Kitchen omeprazole (PRILOSEC) 40 MG capsule Take 40 mg by mouth daily.     Marland Kitchen oxyCODONE (OXY IR/ROXICODONE) 5 MG immediate release tablet Take 1 tablet (5 mg total) by mouth every 4 (four)  hours as needed for severe pain. 30 tablet 0  . potassium chloride SA (K-DUR,KLOR-CON) 20 MEQ tablet Take 1 tablet (20 mEq total) by mouth daily. 90 tablet 3  . tiZANidine (ZANAFLEX) 4 MG tablet Take 1/5-1tid prn    . traMADol (ULTRAM) 50 MG tablet TAKE 1 TABLET BY MOUTH 4 TIMES DAILY AS NEEDED 90 tablet 0  . vitamin B-12 (CYANOCOBALAMIN) 100 MCG tablet Take 1 tablet (100 mcg total) by mouth daily.     No current facility-administered medications on file prior to visit.     Allergies  Allergen Reactions  . Bactroban [Mupirocin Calcium]     Facial redness and swelling  . Codeine     REACTION: nausea and vomiting  . Penicillins Nausea And Vomiting    REACTION: rash  . Valtrex [Valacyclovir Hcl] Swelling    Per pt causes redness    Past Medical History:  Diagnosis Date  . Allergy   . Anxiety   . CAD  (coronary artery disease)   . Cataract   . Hemorrhoids   . Hypertension   . Mitral valve prolapse   . Osteoporosis   . Status post dilation of esophageal narrowing     Past Surgical History:  Procedure Laterality Date  . BASAL CELL CARCINOMA EXCISION  02/05/2016   Dr. Sarajane Jews, [facility]  . BLADDER SURGERY  11/15/1995   bladder neck suspension, urinary incontinence  . CARDIAC CATHETERIZATION  01/03/2017 DES LAD  . CATARACT EXTRACTION Left 04/30/2016   Dr. Tommy Rainwater, [place] N/A 01/03/2017   Procedure: Coronary Stent Intervention;  Surgeon: Nelva Bush, MD;  Location: [place] [facility];  Service: Cardiovascular;  Laterality: N/A;  . DENTAL SURGERY  06/20/2016   Tooth Implant; Dr. Kalman Shan  . INTRAVASCULAR ULTRASOUND/IVUS N/A 01/03/2017   Procedure: Intravascular Ultrasound/IVUS;  Surgeon: Nelva Bush, MD;  Location: [place] [facility];  Service: Cardiovascular;  Laterality: N/A;  . KNEE SURGERY  2003   torn meniscus  . LEFT HEART CATH AND CORONARY ANGIOGRAPHY N/A 01/03/2017   Procedure: Left Heart Cath and Coronary Angiography;  Surgeon: Nelva Bush, MD;  Location: [place] [facility];  Service: Cardiovascular;  Laterality: N/A;  . MOHS SURGERY Right 2017   right side of face  . ROTATOR CUFF REPAIR    . SHOULDER SURGERY Right 2005    Family History  Problem Relation Age of Onset  . Mitral valve prolapse Mother   . Hypertension Father   . Brain cancer Father     Social History   Socioeconomic History  . Marital status: Divorced    Spouse name: Not on file  . Number of children: 2  . Years of education: Not on file  . Highest education level: Not on file  Occupational History  . 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 Financial resource strain: Not on file  . Food insecurity:    Worry: Not on file    Inability: Not on file  . Transportation needs:    Medical: Not on file      Non-medical: Not on file  Tobacco Use  . Smoking status: Never Smoker  . Smokeless tobacco: Never Used  Substance and Sexual Activity  . Alcohol use: No    Alcohol/week: 0.0 standard drinks  . Drug use: No  . Sexual activity: Not Currently  Lifestyle  . Physical activity:    Days per week: Not on file    Minutes  per session: Not on file  . Stress: Not on file  Relationships  . Social connections:    Talks on phone: Not on file    Gets together: Not on file    Attends religious service: Not on file    Active member of club or organization: Not on file    Attends meetings of clubs or organizations: Not on file    Relationship status: Not on file  . Intimate partner violence:    Fear of current or ex partner: Not on file    Emotionally abused: Not on file    Physically abused: Not on file    Forced sexual activity: Not on file  Other Topics Concern  . Not on file  Social History Narrative   Has a living will.   HPOA- daughter.   She would desire CPR.  Would desire life support if necessary and not futile.         The PMH, PSH, Social History, Family History, Medications, and allergies have been reviewed in [facility], and have been updated if relevant.   Review of Systems  Constitutional: Positive for fatigue. Negative for fever.  Genitourinary: Positive for frequency and pelvic pain. Negative for dysuria and hematuria.  All other systems reviewed and are negative.      Objective:    BP 130/68 (BP Location: Left Arm, Patient Position: Sitting, Cuff Size: Normal)   Pulse 65   Temp 98.6 F (37 C) (Oral)   Ht 5' 2"  (1.575 m)   Wt 143 lb 12.8 oz (65.2 kg)   LMP  (LMP Unknown)   SpO2 98%   BMI 26.30 kg/m    Physical Exam Vitals signs and nursing note reviewed.  Constitutional:      Appearance: Normal appearance.  HENT:     Head: Normocephalic and atraumatic.     Mouth/Throat:     Mouth: Mucous membranes are moist.  Neck:     Musculoskeletal: Normal range of  motion.  Cardiovascular:     Rate and Rhythm: Normal rate.  Pulmonary:     Effort: Pulmonary effort is normal.     Breath sounds: Normal breath sounds. No stridor.  Musculoskeletal: Normal range of motion.  Skin:    General: Skin is warm and dry.  Neurological:     General: No focal deficit present.     Mental Status: She is alert and oriented to person, place, and time.  Psychiatric:        Mood and Affect: Mood normal.        Behavior: Behavior normal.        Thought Content: Thought content normal.        Judgment: Judgment normal.           Assessment & Plan:   Dysuria - Plan: POCT urinalysis dipstick No follow-ups on file.

## 2018-09-05 NOTE — Patient Instructions (Signed)
Great to see you.  Some of your symptoms, could just be from some bladder irritation remaining from your urinary tract infection.  Your urine looked good today.  To avoid bladder irritants... Drink water, avoid alcohol, caffeine, soda, citris, tomato, spicy foods.

## 2018-09-05 NOTE — Assessment & Plan Note (Signed)
UTI symptoms have resolved and she was treated with appropriate abx. ? Due to remaining bladder irritation.  UA negative today.   Discussed with patient.. Frequency may be due to bladder irritants... Drink water, avoid alcohol, caffeine, soda, citris, tomato, spicy foods.

## 2018-09-06 ENCOUNTER — Encounter (HOSPITAL_COMMUNITY): Payer: Self-pay

## 2018-09-08 ENCOUNTER — Encounter (HOSPITAL_COMMUNITY): Payer: Self-pay

## 2018-09-11 ENCOUNTER — Encounter (HOSPITAL_COMMUNITY): Payer: Self-pay

## 2018-09-12 ENCOUNTER — Telehealth (HOSPITAL_COMMUNITY): Payer: Self-pay | Admitting: Family Medicine

## 2018-09-13 ENCOUNTER — Encounter (HOSPITAL_COMMUNITY): Payer: Self-pay

## 2018-09-14 ENCOUNTER — Other Ambulatory Visit: Payer: PPO

## 2018-09-14 DIAGNOSIS — M47816 Spondylosis without myelopathy or radiculopathy, lumbar region: Secondary | ICD-10-CM | POA: Diagnosis not present

## 2018-09-14 DIAGNOSIS — M545 Low back pain: Secondary | ICD-10-CM | POA: Diagnosis not present

## 2018-09-15 ENCOUNTER — Encounter (HOSPITAL_COMMUNITY): Payer: Self-pay

## 2018-09-18 ENCOUNTER — Encounter (HOSPITAL_COMMUNITY)
Admission: RE | Admit: 2018-09-18 | Discharge: 2018-09-18 | Disposition: A | Discharge status: Home / Self Care | Payer: Self-pay | Source: Ambulatory Visit | Attending: Cardiology | Admitting: Cardiology

## 2018-09-18 DIAGNOSIS — R0609 Other forms of dyspnea: Secondary | ICD-10-CM | POA: Insufficient documentation

## 2018-09-18 DIAGNOSIS — I2583 Coronary atherosclerosis due to lipid rich plaque: Secondary | ICD-10-CM | POA: Insufficient documentation

## 2018-09-18 DIAGNOSIS — I2510 Atherosclerotic heart disease of native coronary artery without angina pectoris: Secondary | ICD-10-CM | POA: Insufficient documentation

## 2018-09-18 DIAGNOSIS — I10 Essential (primary) hypertension: Secondary | ICD-10-CM | POA: Insufficient documentation

## 2018-09-18 DIAGNOSIS — R5383 Other fatigue: Secondary | ICD-10-CM | POA: Insufficient documentation

## 2018-09-20 ENCOUNTER — Encounter (HOSPITAL_COMMUNITY)
Admission: RE | Admit: 2018-09-20 | Discharge: 2018-09-20 | Disposition: A | Discharge status: Home / Self Care | Payer: Self-pay | Source: Ambulatory Visit | Attending: Cardiology | Admitting: Cardiology

## 2018-09-22 ENCOUNTER — Encounter (HOSPITAL_COMMUNITY): Payer: Self-pay

## 2018-09-25 ENCOUNTER — Encounter (HOSPITAL_COMMUNITY)
Admission: RE | Admit: 2018-09-25 | Discharge: 2018-09-25 | Disposition: A | Discharge status: Home / Self Care | Payer: PPO | Source: Ambulatory Visit | Attending: Cardiology | Admitting: Cardiology

## 2018-09-25 ENCOUNTER — Other Ambulatory Visit: Payer: PPO | Admitting: *Deleted

## 2018-09-25 DIAGNOSIS — Z1231 Encounter for screening mammogram for malignant neoplasm of breast: Secondary | ICD-10-CM | POA: Diagnosis not present

## 2018-09-25 DIAGNOSIS — E876 Hypokalemia: Secondary | ICD-10-CM | POA: Diagnosis not present

## 2018-09-25 DIAGNOSIS — R748 Abnormal levels of other serum enzymes: Secondary | ICD-10-CM

## 2018-09-25 LAB — BASIC METABOLIC PANEL
BUN/Creatinine Ratio: 14 (ref 12–28)
BUN: 14 mg/dL (ref 8–27)
CO2: 25 mmol/L (ref 20–29)
Calcium: 9.4 mg/dL (ref 8.7–10.3)
Chloride: 99 mmol/L (ref 96–106)
Creatinine, Ser: 0.97 mg/dL (ref 0.57–1.00)
GFR calc Af Amer: 69 mL/min/{1.73_m2} (ref >59)
GFR calc non Af Amer: 60 mL/min/{1.73_m2} (ref >59)
Glucose: 87 mg/dL (ref 65–99)
Potassium: 3.4 mmol/L — ABNORMAL LOW (ref 3.5–5.2)
Sodium: 141 mmol/L (ref 134–144)

## 2018-09-25 LAB — HEPATIC FUNCTION PANEL
ALT: 19 IU/L (ref 0–32)
AST: 23 IU/L (ref 0–40)
Albumin: 4.1 g/dL (ref 3.8–4.8)
Alkaline Phosphatase: 144 IU/L — ABNORMAL HIGH (ref 39–117)
Bilirubin Total: 0.5 mg/dL (ref 0.0–1.2)
Bilirubin, Direct: 0.15 mg/dL (ref 0.00–0.40)
Total Protein: 6.6 g/dL (ref 6.0–8.5)

## 2018-09-25 LAB — HM MAMMOGRAPHY

## 2018-09-26 ENCOUNTER — Other Ambulatory Visit: Payer: Self-pay | Admitting: *Deleted

## 2018-09-26 ENCOUNTER — Other Ambulatory Visit: Payer: Self-pay | Admitting: Family Medicine

## 2018-09-26 DIAGNOSIS — R748 Abnormal levels of other serum enzymes: Secondary | ICD-10-CM

## 2018-09-26 DIAGNOSIS — E876 Hypokalemia: Secondary | ICD-10-CM

## 2018-09-26 MED ORDER — POTASSIUM CHLORIDE CRYS ER 20 MEQ PO TBCR: 20.0000 meq | EXTENDED_RELEASE_TABLET | Freq: Two times a day (BID) | ORAL | 3 refills | Status: DC

## 2018-09-26 NOTE — Telephone Encounter (Signed)
TA-LOV 1.21.20/last filled 12.23.2019/per [place] PMP ok no red flags/thx dmf

## 2018-09-27 ENCOUNTER — Encounter (HOSPITAL_COMMUNITY)
Admission: RE | Admit: 2018-09-27 | Discharge: 2018-09-27 | Disposition: A | Discharge status: Home / Self Care | Payer: Self-pay | Source: Ambulatory Visit | Attending: Cardiology | Admitting: Cardiology

## 2018-09-28 ENCOUNTER — Telehealth: Payer: Self-pay | Admitting: Nurse Practitioner

## 2018-09-28 NOTE — Telephone Encounter (Signed)
Pt c/o swelling: STAT is pt has developed SOB within 24 hours  1) How much weight have you gained and in what time span? Couple of pounds in the last 2 days  2) If swelling, where is the swelling located?bo swelling, felt bloated  3) Are you currently taking a fluid pill?  Not taking anything at this time  4) Are you currently SOB? no 5) Do you have a log of your daily weights (if so, list)?yes  6) Have you gained 3 pounds in a day or 5 pounds in a week? no  7) Have you traveled recently? no    Pt  wants to know if she should take Lasix now, since her Potassium medicine was doubled on 09-26-18?

## 2018-09-28 NOTE — Telephone Encounter (Signed)
Per pt, she weighs everyday and has noticed weight is up about 2 lbs since the beginning of the week.  She is asking if it is OK to take Furosemide or if she should.  Advised pt the general rule is 3 lbs over night or 5 lbs of weigh gain in a week.  This has not occurred and she does not have any edema/SOB at this time.  She will continue to monitor weights and take Furosemide PRN as instructed.

## 2018-09-29 ENCOUNTER — Encounter: Payer: Self-pay | Admitting: Family Medicine

## 2018-09-29 ENCOUNTER — Encounter (HOSPITAL_COMMUNITY)
Admission: RE | Admit: 2018-09-29 | Discharge: 2018-09-29 | Disposition: A | Discharge status: Home / Self Care | Payer: Self-pay | Source: Ambulatory Visit | Attending: Cardiology | Admitting: Cardiology

## 2018-10-02 ENCOUNTER — Encounter (HOSPITAL_COMMUNITY)
Admission: RE | Admit: 2018-10-02 | Discharge: 2018-10-02 | Disposition: A | Discharge status: Home / Self Care | Payer: Self-pay | Source: Ambulatory Visit | Attending: Cardiology | Admitting: Cardiology

## 2018-10-04 ENCOUNTER — Encounter (HOSPITAL_COMMUNITY)
Admission: RE | Admit: 2018-10-04 | Discharge: 2018-10-04 | Disposition: A | Discharge status: Home / Self Care | Payer: Self-pay | Source: Ambulatory Visit | Attending: Cardiology | Admitting: Cardiology

## 2018-10-05 ENCOUNTER — Telehealth: Payer: Self-pay

## 2018-10-05 NOTE — Telephone Encounter (Signed)
Printed and filled out [employer] and will call pt when signed on 2.24.20 by Dr. Aron/thx dmf  Copied from [place] [phone]. Topic: General - Other >> Oct 04, 2018 12:18 PM Hannah Vasquez wrote: Reason for CRM:  Patient is requesting another temporary handicap sticker in regards to her back. She stated she can by and pick up the forms once completed. Please advise

## 2018-10-06 ENCOUNTER — Encounter (HOSPITAL_COMMUNITY): Payer: Self-pay

## 2018-10-09 ENCOUNTER — Encounter (HOSPITAL_COMMUNITY)
Admission: RE | Admit: 2018-10-09 | Discharge: 2018-10-09 | Disposition: A | Discharge status: Home / Self Care | Payer: Self-pay | Source: Ambulatory Visit | Attending: Cardiology | Admitting: Cardiology

## 2018-10-10 ENCOUNTER — Telehealth: Payer: Self-pay

## 2018-10-10 NOTE — Telephone Encounter (Signed)
Pt aware that [employer] documentation is ready for pt to pick up at front desk of office.

## 2018-10-11 ENCOUNTER — Encounter (HOSPITAL_COMMUNITY)
Admission: RE | Admit: 2018-10-11 | Discharge: 2018-10-11 | Disposition: A | Discharge status: Home / Self Care | Payer: PPO | Source: Ambulatory Visit | Attending: Cardiology | Admitting: Cardiology

## 2018-10-13 ENCOUNTER — Encounter (HOSPITAL_COMMUNITY): Payer: Self-pay

## 2018-10-16 ENCOUNTER — Encounter (HOSPITAL_COMMUNITY)
Admission: RE | Admit: 2018-10-16 | Discharge: 2018-10-16 | Disposition: A | Discharge status: Home / Self Care | Payer: Self-pay | Source: Ambulatory Visit | Attending: Cardiology | Admitting: Cardiology

## 2018-10-16 DIAGNOSIS — I2510 Atherosclerotic heart disease of native coronary artery without angina pectoris: Secondary | ICD-10-CM | POA: Insufficient documentation

## 2018-10-16 DIAGNOSIS — I10 Essential (primary) hypertension: Secondary | ICD-10-CM | POA: Insufficient documentation

## 2018-10-16 DIAGNOSIS — I2583 Coronary atherosclerosis due to lipid rich plaque: Secondary | ICD-10-CM | POA: Insufficient documentation

## 2018-10-16 DIAGNOSIS — R0609 Other forms of dyspnea: Secondary | ICD-10-CM | POA: Insufficient documentation

## 2018-10-16 DIAGNOSIS — R5383 Other fatigue: Secondary | ICD-10-CM | POA: Insufficient documentation

## 2018-10-18 ENCOUNTER — Encounter (HOSPITAL_COMMUNITY): Payer: Self-pay

## 2018-10-20 ENCOUNTER — Encounter (HOSPITAL_COMMUNITY)
Admission: RE | Admit: 2018-10-20 | Discharge: 2018-10-20 | Disposition: A | Discharge status: Home / Self Care | Payer: Self-pay | Source: Ambulatory Visit | Attending: Cardiology | Admitting: Cardiology

## 2018-10-23 ENCOUNTER — Encounter (HOSPITAL_COMMUNITY)
Admission: RE | Admit: 2018-10-23 | Discharge: 2018-10-23 | Disposition: A | Discharge status: Home / Self Care | Payer: Self-pay | Source: Ambulatory Visit | Attending: Cardiology | Admitting: Cardiology

## 2018-10-25 ENCOUNTER — Other Ambulatory Visit: Payer: Self-pay

## 2018-10-25 ENCOUNTER — Encounter (HOSPITAL_COMMUNITY)
Admission: RE | Admit: 2018-10-25 | Discharge: 2018-10-25 | Disposition: A | Discharge status: Home / Self Care | Payer: Self-pay | Source: Ambulatory Visit | Attending: Cardiology | Admitting: Cardiology

## 2018-10-25 ENCOUNTER — Other Ambulatory Visit: Payer: PPO | Admitting: *Deleted

## 2018-10-25 DIAGNOSIS — R748 Abnormal levels of other serum enzymes: Secondary | ICD-10-CM

## 2018-10-25 DIAGNOSIS — E876 Hypokalemia: Secondary | ICD-10-CM

## 2018-10-25 LAB — HEPATIC FUNCTION PANEL
ALT: 18 IU/L (ref 0–32)
AST: 25 IU/L (ref 0–40)
Albumin: 4.5 g/dL (ref 3.8–4.8)
Alkaline Phosphatase: 146 IU/L — ABNORMAL HIGH (ref 39–117)
Bilirubin Total: 0.4 mg/dL (ref 0.0–1.2)
Bilirubin, Direct: 0.11 mg/dL (ref 0.00–0.40)
Total Protein: 6.9 g/dL (ref 6.0–8.5)

## 2018-10-25 LAB — BASIC METABOLIC PANEL
BUN/Creatinine Ratio: 10 — ABNORMAL LOW (ref 12–28)
BUN: 9 mg/dL (ref 8–27)
CO2: 23 mmol/L (ref 20–29)
Calcium: 9.4 mg/dL (ref 8.7–10.3)
Chloride: 100 mmol/L (ref 96–106)
Creatinine, Ser: 0.93 mg/dL (ref 0.57–1.00)
GFR calc Af Amer: 72 mL/min/{1.73_m2} (ref >59)
GFR calc non Af Amer: 62 mL/min/{1.73_m2} (ref >59)
Glucose: 115 mg/dL — ABNORMAL HIGH (ref 65–99)
Potassium: 3.1 mmol/L — ABNORMAL LOW (ref 3.5–5.2)
Sodium: 142 mmol/L (ref 134–144)

## 2018-10-26 ENCOUNTER — Other Ambulatory Visit: Payer: Self-pay | Admitting: *Deleted

## 2018-10-26 DIAGNOSIS — Z79899 Other long term (current) drug therapy: Secondary | ICD-10-CM

## 2018-10-26 DIAGNOSIS — E876 Hypokalemia: Secondary | ICD-10-CM

## 2018-10-26 DIAGNOSIS — I10 Essential (primary) hypertension: Secondary | ICD-10-CM

## 2018-10-26 DIAGNOSIS — R748 Abnormal levels of other serum enzymes: Secondary | ICD-10-CM

## 2018-10-26 MED ORDER — POTASSIUM CHLORIDE CRYS ER 20 MEQ PO TBCR: 20.0000 meq | EXTENDED_RELEASE_TABLET | Freq: Two times a day (BID) | ORAL | 2 refills | Status: DC

## 2018-10-27 ENCOUNTER — Other Ambulatory Visit: Payer: Self-pay

## 2018-10-27 ENCOUNTER — Telehealth: Payer: Self-pay | Admitting: Nurse Practitioner

## 2018-10-27 ENCOUNTER — Other Ambulatory Visit: Payer: Self-pay | Admitting: Nurse Practitioner

## 2018-10-27 ENCOUNTER — Encounter (HOSPITAL_COMMUNITY): Payer: Self-pay

## 2018-10-27 MED ORDER — FUROSEMIDE 20 MG PO TABS: 20.0000 mg | ORAL_TABLET | Freq: Every day | ORAL | 3 refills | Status: DC | PRN

## 2018-10-27 MED ORDER — POTASSIUM CHLORIDE CRYS ER 20 MEQ PO TBCR: 40.0000 meq | EXTENDED_RELEASE_TABLET | Freq: Two times a day (BID) | ORAL | 3 refills | Status: DC

## 2018-10-27 NOTE — Telephone Encounter (Signed)
Called patient back. Patient wanted to apologize for the confusion yesterday. Patient stated she is taking potassium 20 meq BID since 09/26/18. Informed patient that since she has been taking potassium 20 meq BID to increase it to 40 meq BID. Patient verbalized understanding and will increase her potassium to 40 meq BID. Patient will repeat lab work in April to see if this helps bring her potassium level up.

## 2018-10-27 NOTE — Telephone Encounter (Signed)
Patient states she was called yesterday with lab results and was told to increase her potassium however she says she is already taking two Potassium supplement - 20 mEq as of February 11th as she was instructed by Cecille Rubin.

## 2018-10-30 ENCOUNTER — Other Ambulatory Visit: Payer: Self-pay | Admitting: Family Medicine

## 2018-10-30 ENCOUNTER — Telehealth (HOSPITAL_COMMUNITY): Payer: Self-pay | Admitting: *Deleted

## 2018-10-30 ENCOUNTER — Other Ambulatory Visit: Payer: Self-pay

## 2018-10-30 ENCOUNTER — Ambulatory Visit (INDEPENDENT_AMBULATORY_CARE_PROVIDER_SITE_OTHER): Payer: PPO

## 2018-10-30 ENCOUNTER — Ambulatory Visit (INDEPENDENT_AMBULATORY_CARE_PROVIDER_SITE_OTHER): Payer: PPO | Admitting: Family Medicine

## 2018-10-30 ENCOUNTER — Encounter: Payer: Self-pay | Admitting: Family Medicine

## 2018-10-30 ENCOUNTER — Encounter (HOSPITAL_COMMUNITY): Payer: Self-pay

## 2018-10-30 VITALS — BP 110/62 | HR 64 | Temp 97.7°F | Ht 62.0 in | Wt 147.6 lb

## 2018-10-30 DIAGNOSIS — M79642 Pain in left hand: Secondary | ICD-10-CM

## 2018-10-30 DIAGNOSIS — M65332 Trigger finger, left middle finger: Secondary | ICD-10-CM | POA: Diagnosis not present

## 2018-10-30 NOTE — Telephone Encounter (Signed)
Contacted patient to notify of Cardiac Rehab department closure x2 weeks. Pt verbalized understanding. Seward Carol, Exercise Physiologist Cardiac and Pulmonary Rehabilitaiton

## 2018-10-30 NOTE — Patient Instructions (Addendum)
Nice to meet you  Please try to buddy tape it as much as you can. Especially at night.  Please try the rub on medication.  Please see me back in 3-4 weeks if no better.

## 2018-10-30 NOTE — Assessment & Plan Note (Signed)
Pain likely related to trigger finger.  Likely at the beginning stages.  No suggestion on exam to suspect Dupuytren's -Injection today. -Buddy tape and counseled on ongoing taping. -Can follow-up in 3 to 4 weeks if still ongoing may need to consider reinjection.

## 2018-10-30 NOTE — Progress Notes (Signed)
Hannah Vasquez - 68 y.o. female MRN [id number]  Date of birth: August 04, 1951  SUBJECTIVE:  Including CC & ROS.  Chief Complaint  Patient presents with  . Pain    left hand pain-- middle finger down to palm/loosing strength/3 weeks/ no injury     Hannah Vasquez is a 67 y.o. female that is presenting with left volar hand and third digit pain.  This is been ongoing for 3 weeks.  Denies any inciting event.  The pain is getting worse.  It is worse at night and in the morning.  The pain is throbbing in nature.  No improvement with home remedies.  No specific triggering but does seem like it may lock from time to time.  No history of similar symptoms.  No repetitive movements.  Is having pain with opening jars.  Denies any numbness or tingling.   Review of Systems  Constitutional: Negative for fever.  HENT: Negative for congestion.   Respiratory: Negative for cough.   Cardiovascular: Negative for chest pain.  Gastrointestinal: Negative for abdominal pain.  Musculoskeletal: Positive for back pain.  Skin: Negative for color change.  Neurological: Positive for weakness.  Hematological: Negative for adenopathy.  Psychiatric/Behavioral: Negative for agitation.    HISTORY: Past Medical, Surgical, Social, and Family History Reviewed & Updated per EMR.   Pertinent Historical Findings include:  Past Medical History:  Diagnosis Date  . Allergy   . Anxiety   . CAD (coronary artery disease)   . Cataract   . Hemorrhoids   . Hypertension   . Mitral valve prolapse   . Osteoporosis   . Status post dilation of esophageal narrowing     Past Surgical History:  Procedure Laterality Date  . BASAL CELL CARCINOMA EXCISION  02/05/2016   Dr. Sarajane Jews, [facility]  . BLADDER SURGERY  11/15/1995   bladder neck suspension, urinary incontinence  . CARDIAC CATHETERIZATION  01/03/2017 DES LAD  . CATARACT EXTRACTION Left 04/30/2016   Dr. Tommy Rainwater, [place] N/A  01/03/2017   Procedure: Coronary Stent Intervention;  Surgeon: Nelva Bush, MD;  Location: [place] [facility];  Service: Cardiovascular;  Laterality: N/A;  . DENTAL SURGERY  06/20/2016   Tooth Implant; Dr. Kalman Shan  . INTRAVASCULAR ULTRASOUND/IVUS N/A 01/03/2017   Procedure: Intravascular Ultrasound/IVUS;  Surgeon: Nelva Bush, MD;  Location: [place] [facility];  Service: Cardiovascular;  Laterality: N/A;  . KNEE SURGERY  2003   torn meniscus  . LEFT HEART CATH AND CORONARY ANGIOGRAPHY N/A 01/03/2017   Procedure: Left Heart Cath and Coronary Angiography;  Surgeon: Nelva Bush, MD;  Location: [place] [facility];  Service: Cardiovascular;  Laterality: N/A;  . MOHS SURGERY Right 2017   right side of face  . ROTATOR CUFF REPAIR    . SHOULDER SURGERY Right 2005    Allergies  Allergen Reactions  . Bactroban [Mupirocin Calcium]     Facial redness and swelling  . Codeine     REACTION: nausea and vomiting  . Penicillins Nausea And Vomiting    REACTION: rash  . Valtrex [Valacyclovir Hcl] Swelling    Per pt causes redness    Family History  Problem Relation Age of Onset  . Mitral valve prolapse Mother   . Hypertension Father   . Brain cancer Father      Social History   Socioeconomic History  . Marital status: Divorced    Spouse name: Not on file  . Number of children: 2  . Years  of education: Not on file  . Highest education level: Not on file  Occupational History  . 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 Financial resource strain: Not on file  . Food insecurity:    Worry: Not on file    Inability: Not on file  . Transportation needs:    Medical: Not on file    Non-medical: Not on file  Tobacco Use  . Smoking status: Never Smoker  . Smokeless tobacco: Never Used  Substance and Sexual Activity  . Alcohol use: No    Alcohol/week: 0.0 standard drinks  . Drug use: No  . Sexual activity: Not Currently  Lifestyle  .  Physical activity:    Days per week: Not on file    Minutes per session: Not on file  . Stress: Not on file  Relationships  . Social connections:    Talks on phone: Not on file    Gets together: Not on file    Attends religious service: Not on file    Active member of club or organization: Not on file    Attends meetings of clubs or organizations: Not on file    Relationship status: Not on file  . Intimate partner violence:    Fear of current or ex partner: Not on file    Emotionally abused: Not on file    Physically abused: Not on file    Forced sexual activity: Not on file  Other Topics Concern  . Not on file  Social History Narrative   Has a living will.   HPOA- daughter.   She would desire CPR.  Would desire life support if necessary and not futile.           PHYSICAL EXAM:  VS: BP 110/62   Pulse 64   Temp 97.7 F (36.5 C) (Oral)   Ht 5' 2"  (1.575 m)   Wt 147 lb 9.6 oz (67 kg)   LMP  (LMP Unknown)   SpO2 97%   BMI 27.00 kg/m  Physical Exam Gen: NAD, alert, cooperative with exam, well-appearing ENT: normal lips, normal nasal mucosa,  Eye: normal EOM, normal conjunctiva and lids CV:  no edema, +2 pedal pulses   Resp: no accessory muscle use, non-labored,  Skin: no rashes, no areas of induration  Neuro: normal tone, normal sensation to touch Psych:  normal insight, alert and oriented MSK:  Left hand: No signs of atrophy. Some tenderness to palpation over the volar aspect of the third MCP joint. No appreciated triggering reproduced. Normal range of motion. Normal strength resistance with finger abduction abduction. Normal grip strength. Neurovascular intact  Limited ultrasound: Left third digit:  Normal-appearing flexor tendon. There appears to be a nodule over the volar aspect of the third MCP joint just superior to the flexor tendon. No significant degenerative changes of the MCP joint of the PIP joint  Summary: Findings suggestive of a flexor nodule  to suggest trigger finger of the third digit  Ultrasound and interpretation by Clearance Coots, MD   Aspiration/Injection Procedure Note Hannah Vasquez [birth year]  Procedure: Injection Indications: Left finger pain  Procedure Details Consent: Risks of procedure as well as the alternatives and risks of each were explained to the (patient/caregiver).  Consent for procedure obtained. Time Out: Verified patient identification, verified procedure, site/side was marked, verified correct patient position, special equipment/implants available, medications/allergies/relevent history reviewed, required imaging and test results available.  Performed.  The area was cleaned with iodine  and alcohol swabs.    The left third flexor tendon sheath trigger finger was injected using 0.5 cc's of 40 mg Kenalog and 1.5 cc's of 0.25% bupivacaine with a 25 1 1/2" needle.  Ultrasound was used. Images were obtained in long views showing the injection.     A sterile dressing was applied.  Patient did tolerate procedure well.      ASSESSMENT & PLAN:   Trigger finger, left middle finger Pain likely related to trigger finger.  Likely at the beginning stages.  No suggestion on exam to suspect Dupuytren's -Injection today. -Buddy tape and counseled on ongoing taping. -Can follow-up in 3 to 4 weeks if still ongoing may need to consider reinjection.

## 2018-10-31 NOTE — Telephone Encounter (Signed)
TA-LOV 1.21.20 with you/LOV with JS 3.16.20/last filled 2.11.20/per [place] PMP ok no red flags/thx dmf

## 2018-11-01 ENCOUNTER — Encounter (HOSPITAL_COMMUNITY): Payer: Self-pay

## 2018-11-03 ENCOUNTER — Encounter (HOSPITAL_COMMUNITY): Payer: Self-pay

## 2018-11-06 ENCOUNTER — Encounter (HOSPITAL_COMMUNITY): Payer: Self-pay

## 2018-11-07 ENCOUNTER — Telehealth (HOSPITAL_COMMUNITY): Payer: Self-pay | Admitting: Family Medicine

## 2018-11-08 ENCOUNTER — Encounter (HOSPITAL_COMMUNITY): Payer: Self-pay

## 2018-11-10 ENCOUNTER — Encounter (HOSPITAL_COMMUNITY): Payer: Self-pay

## 2018-11-13 ENCOUNTER — Encounter (HOSPITAL_COMMUNITY): Payer: Self-pay

## 2018-11-15 ENCOUNTER — Encounter (HOSPITAL_COMMUNITY): Payer: Self-pay

## 2018-11-17 ENCOUNTER — Encounter (HOSPITAL_COMMUNITY): Payer: Self-pay

## 2018-11-20 ENCOUNTER — Ambulatory Visit: Payer: PPO | Admitting: Family Medicine

## 2018-11-20 ENCOUNTER — Encounter (HOSPITAL_COMMUNITY): Payer: Self-pay

## 2018-11-22 ENCOUNTER — Telehealth: Payer: Self-pay | Admitting: Nurse Practitioner

## 2018-11-22 ENCOUNTER — Encounter (HOSPITAL_COMMUNITY): Payer: Self-pay

## 2018-11-22 NOTE — Telephone Encounter (Signed)
Patient has lab appt on Friday, she wants to know if she should keep that appt or cx it and reschedule to a later date.

## 2018-11-22 NOTE — Telephone Encounter (Signed)
Ok to push out for 4 weeks her labs.

## 2018-11-22 NOTE — Telephone Encounter (Signed)
S/w pt r/s labs for May.  Will t/w pt when lab results are in.

## 2018-11-23 ENCOUNTER — Telehealth (HOSPITAL_COMMUNITY): Payer: Self-pay | Admitting: Cardiac Rehabilitation

## 2018-11-23 NOTE — Telephone Encounter (Signed)
Pt phone call to inform of continued Outpatient Cardiac Rehab departmental closure for COVID 19 precautions. Future opening date to be determined. Pt instructed to continue exercising on his own following home exercise guidelines.Pt advised to contact cardiology or PCP PRN symptoms, questions or concerns. Left message on voicemail.  Andi Hence, RN, BSN Cardiac Pulmonary Rehab

## 2018-11-24 ENCOUNTER — Encounter (HOSPITAL_COMMUNITY): Payer: Self-pay

## 2018-11-24 ENCOUNTER — Other Ambulatory Visit: Payer: PPO

## 2018-11-27 ENCOUNTER — Encounter (HOSPITAL_COMMUNITY): Payer: Self-pay

## 2018-11-29 ENCOUNTER — Encounter (HOSPITAL_COMMUNITY): Payer: Self-pay

## 2018-12-01 ENCOUNTER — Encounter (HOSPITAL_COMMUNITY): Payer: Self-pay

## 2018-12-04 ENCOUNTER — Encounter (HOSPITAL_COMMUNITY): Payer: Self-pay

## 2018-12-06 ENCOUNTER — Encounter (HOSPITAL_COMMUNITY): Payer: Self-pay

## 2018-12-08 ENCOUNTER — Encounter (HOSPITAL_COMMUNITY): Payer: Self-pay

## 2018-12-11 ENCOUNTER — Encounter (HOSPITAL_COMMUNITY): Payer: Self-pay

## 2018-12-13 ENCOUNTER — Encounter (HOSPITAL_COMMUNITY): Payer: Self-pay

## 2018-12-15 ENCOUNTER — Encounter (HOSPITAL_COMMUNITY): Payer: Self-pay

## 2018-12-18 ENCOUNTER — Encounter (HOSPITAL_COMMUNITY): Payer: Self-pay

## 2018-12-20 ENCOUNTER — Encounter (HOSPITAL_COMMUNITY): Payer: Self-pay

## 2018-12-20 ENCOUNTER — Encounter: Payer: Self-pay | Admitting: Family Medicine

## 2018-12-20 ENCOUNTER — Ambulatory Visit (INDEPENDENT_AMBULATORY_CARE_PROVIDER_SITE_OTHER): Payer: PPO | Admitting: Family Medicine

## 2018-12-20 DIAGNOSIS — M65332 Trigger finger, left middle finger: Secondary | ICD-10-CM

## 2018-12-20 NOTE — Patient Instructions (Signed)
Good to see you  Please try to wrap your fingers together if they start triggering.  We can always do another injection if need be  Please send me a message in MyChart with any questions or updates.

## 2018-12-20 NOTE — Progress Notes (Signed)
Hannah Vasquez - 68 y.o. female MRN [id number]  Date of birth: 1951/07/09  SUBJECTIVE:  Including CC & ROS.  Chief Complaint  Patient presents with  . Follow-up    Pt states that trigger fingers been a lot better    Hannah Vasquez is a 68 y.o. female that is  Following up for her triggering finger. Has had resolution of her symptoms. No pain today. Has not been taping her finger. Has returned to normal activity.    Review of Systems  Constitutional: Negative for fever.  HENT: Negative for congestion.   Respiratory: Negative for cough.   Cardiovascular: Negative for chest pain.  Gastrointestinal: Negative for abdominal pain.  Musculoskeletal: Negative for back pain.  Skin: Negative for color change.    HISTORY: Past Medical, Surgical, Social, and Family History Reviewed & Updated per EMR.   Pertinent Historical Findings include:  Past Medical History:  Diagnosis Date  . Allergy   . Anxiety   . CAD (coronary artery disease)   . Cataract   . Hemorrhoids   . History of MI (myocardial infarction) 10/07/2017  . Hypertension   . Mitral valve prolapse   . Osteoporosis   . Status post dilation of esophageal narrowing     Past Surgical History:  Procedure Laterality Date  . BASAL CELL CARCINOMA EXCISION  02/05/2016   Dr. Sarajane Jews, [facility]  . BLADDER SURGERY  11/15/1995   bladder neck suspension, urinary incontinence  . CARDIAC CATHETERIZATION  01/03/2017 DES LAD  . CATARACT EXTRACTION Left 04/30/2016   Dr. Tommy Rainwater, [place] N/A 01/03/2017   Procedure: Coronary Stent Intervention;  Surgeon: Nelva Bush, MD;  Location: [place] [facility];  Service: Cardiovascular;  Laterality: N/A;  . DENTAL SURGERY  06/20/2016   Tooth Implant; Dr. Kalman Shan  . INTRAVASCULAR ULTRASOUND/IVUS N/A 01/03/2017   Procedure: Intravascular Ultrasound/IVUS;  Surgeon: Nelva Bush, MD;  Location: [place] [facility];  Service: Cardiovascular;   Laterality: N/A;  . KNEE SURGERY  2003   torn meniscus  . LEFT HEART CATH AND CORONARY ANGIOGRAPHY N/A 01/03/2017   Procedure: Left Heart Cath and Coronary Angiography;  Surgeon: Nelva Bush, MD;  Location: [place] [facility];  Service: Cardiovascular;  Laterality: N/A;  . MOHS SURGERY Right 2017   right side of face  . ROTATOR CUFF REPAIR    . SHOULDER SURGERY Right 2005    Allergies  Allergen Reactions  . Bactroban [Mupirocin Calcium]     Facial redness and swelling  . Codeine     REACTION: nausea and vomiting  . Penicillins Nausea And Vomiting    REACTION: rash  . Valtrex [Valacyclovir Hcl] Swelling    Per pt causes redness    Family History  Problem Relation Age of Onset  . Mitral valve prolapse Mother   . Hypertension Mother   . Hypertension Father   . Brain cancer Father      Social History   Socioeconomic History  . Marital status: Divorced    Spouse name: Not on file  . Number of children: 2  . Years of education: Not on file  . Highest education level: Not on file  Occupational History  . 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 Financial resource strain: Not on file  . Food insecurity:    Worry: Not on file    Inability: Not on file  . Transportation needs:    Medical: Not on file  Non-medical: Not on file  Tobacco Use  . Smoking status: Never Smoker  . Smokeless tobacco: Never Used  Substance and Sexual Activity  . Alcohol use: No    Alcohol/week: 0.0 standard drinks  . Drug use: No  . Sexual activity: Not Currently  Lifestyle  . Physical activity:    Days per week: Not on file    Minutes per session: Not on file  . Stress: Not on file  Relationships  . Social connections:    Talks on phone: Not on file    Gets together: Not on file    Attends religious service: Not on file    Active member of club or organization: Not on file    Attends meetings of clubs or organizations: Not on file     Relationship status: Not on file  . Intimate partner violence:    Fear of current or ex partner: Not on file    Emotionally abused: Not on file    Physically abused: Not on file    Forced sexual activity: Not on file  Other Topics Concern  . Not on file  Social History Narrative   Has a living will.   HPOA- daughter.   She would desire CPR.  Would desire life support if necessary and not futile.           PHYSICAL EXAM:  VS: BP 130/70   Pulse 68   Temp 98.1 F (36.7 C) (Oral)   Ht 5' 2"  (1.575 m)   Wt 151 lb 9.6 oz (68.8 kg)   LMP  (LMP Unknown)   SpO2 98%   BMI 27.73 kg/m  Physical Exam Gen: NAD, alert, cooperative with exam, well-appearing ENT: normal lips, normal nasal mucosa,  Eye: normal EOM, normal conjunctiva and lids CV:  no edema, +2 pedal pulses   Resp: no accessory muscle use, non-labored,  Skin: no rashes, no areas of induration  Neuro: normal tone, normal sensation to touch Psych:  normal insight, alert and oriented MSK:  Left hand:  No triggering evident.  Normal flexion and extension  Neurovascularly intact      ASSESSMENT & PLAN:   Trigger finger, left middle finger Has had significant improvement since the injection  - provided coban  - counseled on supportive care - can f/u PRN

## 2018-12-20 NOTE — Assessment & Plan Note (Signed)
Has had significant improvement since the injection  - provided coban  - counseled on supportive care - can f/u PRN

## 2018-12-22 ENCOUNTER — Encounter (HOSPITAL_COMMUNITY): Payer: Self-pay

## 2018-12-22 ENCOUNTER — Other Ambulatory Visit: Payer: PPO

## 2018-12-22 ENCOUNTER — Other Ambulatory Visit: Payer: Self-pay

## 2018-12-22 DIAGNOSIS — R748 Abnormal levels of other serum enzymes: Secondary | ICD-10-CM

## 2018-12-22 DIAGNOSIS — E876 Hypokalemia: Secondary | ICD-10-CM | POA: Diagnosis not present

## 2018-12-22 DIAGNOSIS — Z79899 Other long term (current) drug therapy: Secondary | ICD-10-CM | POA: Diagnosis not present

## 2018-12-22 DIAGNOSIS — I10 Essential (primary) hypertension: Secondary | ICD-10-CM

## 2018-12-22 LAB — BASIC METABOLIC PANEL WITH GFR
BUN/Creatinine Ratio: 12 (ref 12–28)
BUN: 10 mg/dL (ref 8–27)
CO2: 24 mmol/L (ref 20–29)
Calcium: 9.9 mg/dL (ref 8.7–10.3)
Chloride: 101 mmol/L (ref 96–106)
Creatinine, Ser: 0.85 mg/dL (ref 0.57–1.00)
GFR calc Af Amer: 80 mL/min/1.73
GFR calc non Af Amer: 70 mL/min/1.73
Glucose: 127 mg/dL — ABNORMAL HIGH (ref 65–99)
Potassium: 4.3 mmol/L (ref 3.5–5.2)
Sodium: 141 mmol/L (ref 134–144)

## 2018-12-22 LAB — HEPATIC FUNCTION PANEL
ALT: 19 IU/L (ref 0–32)
AST: 22 IU/L (ref 0–40)
Albumin: 4.6 g/dL (ref 3.8–4.8)
Alkaline Phosphatase: 148 IU/L — ABNORMAL HIGH (ref 39–117)
Bilirubin Total: 0.6 mg/dL (ref 0.0–1.2)
Bilirubin, Direct: 0.19 mg/dL (ref 0.00–0.40)
Total Protein: 7.1 g/dL (ref 6.0–8.5)

## 2018-12-25 ENCOUNTER — Other Ambulatory Visit: Payer: Self-pay | Admitting: *Deleted

## 2018-12-25 ENCOUNTER — Encounter (HOSPITAL_COMMUNITY): Payer: Self-pay

## 2018-12-25 MED ORDER — POTASSIUM CHLORIDE CRYS ER 20 MEQ PO TBCR: 40.0000 meq | EXTENDED_RELEASE_TABLET | Freq: Two times a day (BID) | ORAL | 3 refills | Status: DC

## 2018-12-27 ENCOUNTER — Ambulatory Visit: Payer: PPO | Admitting: Family Medicine

## 2018-12-27 ENCOUNTER — Encounter (HOSPITAL_COMMUNITY): Payer: Self-pay

## 2018-12-29 ENCOUNTER — Encounter (HOSPITAL_COMMUNITY): Payer: Self-pay

## 2019-01-01 ENCOUNTER — Telehealth: Payer: Self-pay

## 2019-01-01 ENCOUNTER — Telehealth (INDEPENDENT_AMBULATORY_CARE_PROVIDER_SITE_OTHER): Payer: PPO | Admitting: Cardiology

## 2019-01-01 ENCOUNTER — Other Ambulatory Visit: Payer: Self-pay

## 2019-01-01 ENCOUNTER — Encounter: Payer: Self-pay | Admitting: Cardiology

## 2019-01-01 ENCOUNTER — Encounter (HOSPITAL_COMMUNITY): Payer: Self-pay

## 2019-01-01 VITALS — BP 140/76 | HR 67 | Ht 62.0 in | Wt 150.0 lb

## 2019-01-01 DIAGNOSIS — R5383 Other fatigue: Secondary | ICD-10-CM

## 2019-01-01 DIAGNOSIS — E876 Hypokalemia: Secondary | ICD-10-CM | POA: Diagnosis not present

## 2019-01-01 DIAGNOSIS — I10 Essential (primary) hypertension: Secondary | ICD-10-CM

## 2019-01-01 DIAGNOSIS — I2510 Atherosclerotic heart disease of native coronary artery without angina pectoris: Secondary | ICD-10-CM

## 2019-01-01 MED ORDER — FUROSEMIDE 20 MG PO TABS: 20.0000 mg | ORAL_TABLET | Freq: Every day | ORAL | 3 refills | Status: AC | PRN

## 2019-01-01 MED ORDER — POTASSIUM CHLORIDE CRYS ER 20 MEQ PO TBCR: 40.0000 meq | EXTENDED_RELEASE_TABLET | Freq: Two times a day (BID) | ORAL | 3 refills | Status: DC

## 2019-01-01 NOTE — Telephone Encounter (Signed)
YOUR CARDIOLOGY TEAM HAS ARRANGED FOR AN E-VISIT FOR YOUR APPOINTMENT - PLEASE REVIEW IMPORTANT INFORMATION BELOW SEVERAL DAYS PRIOR TO YOUR APPOINTMENT  Due to the recent COVID-19 pandemic, we are transitioning in-person office visits to tele-medicine visits in an effort to decrease unnecessary exposure to our patients, their families, and staff. These visits are billed to your insurance just like a normal visit is. We also encourage you to sign up for MyChart if you have not already done so. You will need a smartphone if possible. For patients that do not have this, we can still complete the visit using a regular telephone but do prefer a smartphone to enable video when possible. You may have a family member that lives with you that can help. If possible, we also ask that you have a blood pressure cuff and scale at home to measure your blood pressure, heart rate and weight prior to your scheduled appointment. Patients with clinical needs that need an in-person evaluation and testing will still be able to come to the office if absolutely necessary. If you have any questions, feel free to call our office.     YOUR PROVIDER WILL BE USING THE FOLLOWING PLATFORM TO COMPLETE YOUR VISIT: [email or web address]  . IF USING MYCHART - How to Download the MyChart App to Your SmartPhone   - If Apple, go to [employer] and type in MyChart in the search bar and download the app. If Android, ask patient to go to [employer] and type in [place] in the search bar and download the app. The app is free but as with any other app downloads, your phone may require you to verify saved payment information or Apple/Android password.  - You will need to then log into the app with your MyChart username and password, and select [facility] as your healthcare provider to link the account.  - When it is time for your visit, go to the MyChart app, find appointments, and click Begin Video Visit. Be sure to Select Allow for your device to  access the Microphone and Camera for your visit. You will then be connected, and your provider will be with you shortly.  **If you have any issues connecting or need assistance, please contact MyChart service desk [phone])**  **If using a computer, in order to ensure the best quality for your visit, you will need to use either of the following Internet Browsers: Insurance underwriter or [employer]**  . IF USING DOXIMITY or [email or web address].ME - The staff will give you instructions on receiving your link to join the meeting the day of your visit.      2-3 DAYS BEFORE YOUR APPOINTMENT  You will receive a telephone call from one of our [place] team members - your caller ID may say "Unknown caller." If this is a video visit, we will walk you through how to get the video launched on your phone. We will remind you check your blood pressure, heart rate and weight prior to your scheduled appointment. If you have an Apple Watch or Kardia, please upload any pertinent ECG strips the day before or morning of your appointment to [facility]. Our staff will also make sure you have reviewed the consent and agree to move forward with your scheduled tele-health visit.     THE DAY OF YOUR APPOINTMENT  Approximately 15 minutes prior to your scheduled appointment, you will receive a telephone call from one of [facility] team - your caller ID may say "Unknown caller."  Our staff will confirm medications, vital signs for the day and any symptoms you may be experiencing. Please have this information available prior to the time of visit start. It may also be helpful for you to have a pad of paper and pen handy for any instructions given during your visit. They will also walk you through joining the smartphone meeting if this is a video visit.    CONSENT FOR TELE-HEALTH VISIT - PLEASE REVIEW  I hereby voluntarily request, consent and authorize [facility] and its employed or contracted physicians, physician  assistants, nurse practitioners or other licensed health care professionals (the Practitioner), to provide me with telemedicine health care services (the "Services") as deemed necessary by the treating Practitioner. I acknowledge and consent to receive the Services by the Practitioner via telemedicine. I understand that the telemedicine visit will involve communicating with the Practitioner through live audiovisual communication technology and the disclosure of certain medical information by electronic transmission. I acknowledge that I have been given the opportunity to request an in-person assessment or other available alternative prior to the telemedicine visit and am voluntarily participating in the telemedicine visit.  I understand that I have the right to withhold or withdraw my consent to the use of telemedicine in the course of my care at any time, without affecting my right to future care or treatment, and that the Practitioner or I may terminate the telemedicine visit at any time. I understand that I have the right to inspect all information obtained and/or recorded in the course of the telemedicine visit and may receive copies of available information for a reasonable fee.  I understand that some of the potential risks of receiving the Services via telemedicine include:  [place] Kitchen Delay or interruption in medical evaluation due to technological equipment failure or disruption; . Information transmitted may not be sufficient (e.g. poor resolution of images) to allow for appropriate medical decision making by the Practitioner; and/or  . In rare instances, security protocols could fail, causing a breach of personal health information.  Furthermore, I acknowledge that it is my responsibility to provide information about my medical history, conditions and care that is complete and accurate to the best of my ability. I acknowledge that Practitioner's advice, recommendations, and/or decision may be based on  factors not within their control, such as incomplete or inaccurate data provided by me or distortions of diagnostic images or specimens that may result from electronic transmissions. I understand that the practice of medicine is not an exact science and that Practitioner makes no warranties or guarantees regarding treatment outcomes. I acknowledge that I will receive a copy of this consent concurrently upon execution via email to the email address I last provided but may also request a printed copy by calling the office of [facility].    I understand that my insurance will be billed for this visit.   I have read or had this consent read to me. . I understand the contents of this consent, which adequately explains the benefits and risks of the Services being provided via telemedicine.  . I have been provided ample opportunity to ask questions regarding this consent and the Services and have had my questions answered to my satisfaction. . I give my informed consent for the services to be provided through the use of telemedicine in my medical care  By participating in this telemedicine visit I agree to the above.

## 2019-01-01 NOTE — Progress Notes (Signed)
Virtual Visit via Telephone Note   This visit type was conducted due to national recommendations for restrictions regarding the COVID-19 Pandemic (e.g. social distancing) in an effort to limit this patient's exposure and mitigate transmission in our community.  Due to her co-morbid illnesses, this patient is at least at moderate risk for complications without adequate follow up.  This format is felt to be most appropriate for this patient at this time.  The patient did not have access to video technology/had technical difficulties with video requiring transitioning to audio format only (telephone).  All issues noted in this document were discussed and addressed.  No physical exam could be performed with this format.  Please refer to the patient's chart for her  consent to telehealth for [facility].   Date:  01/01/2019   ID:  Hannah Vasquez, DOB Aug 01, 1951, MRN [id number]  Patient Location: Home Provider Location: Home  PCP:  Lucille Passy, MD  Cardiologist:  Candee Furbish, MD  Electrophysiologist:  None   Evaluation Performed:  Follow-Up Visit  Chief Complaint:  CAD follow up  History of Present Illness:    Hannah Vasquez is a 68 y.o. female with coronary artery disease status post STEMI in May 2018, 2 stents to the LAD, EF 40 to 45% at that time here for follow-up.  EF is now normal.  Had residual small ramus disease and jailed diagonal.  In January 2019 she was admitted with atypical chest pain fatigue and shortness of breath.  She felt as though she had had ongoing fatigue since her stents were placed, Brilinta to Plavix which did not help.  Was on Effient, but now aspirin only.  Troponins were negative on her readmission.  X-ray negative.  EKG with nonspecific changes.  Beta-blocker was reduced.  Her Valtrex for shingles was discontinued as possible etiology for fatigue.  Seemingly chronic decrease in energy.  Once again had several issues on 07/31/2018 visit with Truitt Merle, NP.   She was participating in the maintenance program, pressure labile.  She is also previously been seen by pulmonary medicine, Dr. Lamonte Sakai, CPX performed that showed decreased exercise tolerance, increased blood pressure response.  Overall today she states that she is doing quite well.  She misses not being able to go to cardiac rehab.  She is not experiencing any significant chest pain shortness of breath.  Her fatigue seems to be improved.  Potassium supplementation 40 twice daily has been administered.  Last check potassium 4.3.  The patient does not have symptoms concerning for COVID-19 infection (fever, chills, cough, or new shortness of breath).    Past Medical History:  Diagnosis Date  . Allergy   . Anxiety   . CAD (coronary artery disease)   . Cataract   . Hemorrhoids   . History of MI (myocardial infarction) 10/07/2017  . Hypertension   . Mitral valve prolapse   . Osteoporosis   . Status post dilation of esophageal narrowing    Past Surgical History:  Procedure Laterality Date  . BASAL CELL CARCINOMA EXCISION  02/05/2016   Dr. Sarajane Jews, [facility]  . BLADDER SURGERY  11/15/1995   bladder neck suspension, urinary incontinence  . CARDIAC CATHETERIZATION  01/03/2017 DES LAD  . CATARACT EXTRACTION Left 04/30/2016   Dr. Tommy Rainwater, [place] N/A 01/03/2017   Procedure: Coronary Stent Intervention;  Surgeon: Nelva Bush, MD;  Location: [place] [facility];  Service: Cardiovascular;  Laterality: N/A;  . DENTAL SURGERY  06/20/2016   Tooth Implant; Dr. Kalman Shan  . INTRAVASCULAR ULTRASOUND/IVUS N/A 01/03/2017   Procedure: Intravascular Ultrasound/IVUS;  Surgeon: Nelva Bush, MD;  Location: [place] [facility];  Service: Cardiovascular;  Laterality: N/A;  . KNEE SURGERY  2003   torn meniscus  . LEFT HEART CATH AND CORONARY ANGIOGRAPHY N/A 01/03/2017   Procedure: Left Heart Cath and Coronary Angiography;  Surgeon: Nelva Bush, MD;   Location: [place] [facility];  Service: Cardiovascular;  Laterality: N/A;  . MOHS SURGERY Right 2017   right side of face  . ROTATOR CUFF REPAIR    . SHOULDER SURGERY Right 2005     Current Meds  Medication Sig  . acetaminophen (TYLENOL) 500 MG tablet Take 1 tablet (500 mg total) by mouth 2 (two) times daily.  Marland Kitchen albuterol (PROVENTIL HFA;VENTOLIN HFA) 108 (90 Base) MCG/ACT inhaler Inhale 1 puff into the lungs every 6 (six) hours as needed for wheezing or shortness of breath.  Marland Kitchen amLODipine (NORVASC) 5 MG tablet Take 1 tablet (5 mg total) by mouth daily.  Marland Kitchen aspirin EC 81 MG tablet Take 81 mg by mouth every morning.   Marland Kitchen atorvastatin (LIPITOR) 80 MG tablet TAKE 1 TABLET BY MOUTH ONCE A DAY AT 6PM  . Calcium-Magnesium-Vitamin D (CALCIUM 1200+D3 PO) Take 1,200 mg by mouth.  . Cholecalciferol (VITAMIN D3) 1000 UNITS CAPS Take 1 capsule by mouth every morning.   . fluticasone (FLONASE) 50 MCG/ACT nasal spray Place 1 spray into both nostrils as needed.   . furosemide (LASIX) 20 MG tablet Take 1 tablet (20 mg total) by mouth daily as needed.  . gabapentin (NEURONTIN) 300 MG capsule Take 1 capsule (300 mg total) by mouth 3 (three) times daily. (Patient taking differently: Take 300 mg by mouth 2 (two) times a day. )  . hydrochlorothiazide (HYDRODIURIL) 25 MG tablet Take 1 tablet (25 mg total) by mouth daily.  . isosorbide mononitrate (IMDUR) 30 MG 24 hr tablet Take 1 tablet (30 mg total) by mouth daily.  Marland Kitchen loratadine (CLARITIN) 10 MG tablet Take 1 tablet (10 mg total) by mouth every morning.  Marland Kitchen losartan (COZAAR) 100 MG tablet Take 1 tablet (100 mg total) by mouth daily.  . metoprolol succinate (TOPROL-XL) 25 MG 24 hr tablet Take 1 tablet (25 mg total) by mouth daily.  . Multiple Vitamin (MULTIVITAMIN) tablet Take 1 tablet by mouth every morning.   . nitroGLYCERIN (NITROSTAT) 0.4 MG SL tablet Place 1 tablet (0.4 mg total) under the tongue every 5 (five) minutes as needed for chest pain.  . Omega-3 Fatty  Acids (FISH OIL) 1200 MG CAPS Take 1 capsule by mouth every morning.   Marland Kitchen omeprazole (PRILOSEC) 40 MG capsule Take 40 mg by mouth daily.   Marland Kitchen oxyCODONE (OXY IR/ROXICODONE) 5 MG immediate release tablet Take 1 tablet (5 mg total) by mouth every 4 (four) hours as needed for severe pain.  . potassium chloride SA (K-DUR) 20 MEQ tablet Take 2 tablets (40 mEq total) by mouth 2 (two) times daily.  Marland Kitchen tiZANidine (ZANAFLEX) 4 MG tablet Take 0.5 mg by mouth once. Take 1/5-1tid prn  . traMADol (ULTRAM) 50 MG tablet TAKE 1 TABLET BY MOUTH 4 TIMES DAILY AS NEEDED  . vitamin B-12 (CYANOCOBALAMIN) 100 MCG tablet Take 1 tablet (100 mcg total) by mouth daily.  . [DISCONTINUED] furosemide (LASIX) 20 MG tablet Take 1 tablet (20 mg total) by mouth daily as needed. Pt needs to keep appt in June to get further refills  . [DISCONTINUED] potassium  chloride SA (K-DUR) 20 MEQ tablet Take 2 tablets (40 mEq total) by mouth 2 (two) times daily.     Allergies:   Bactroban [mupirocin calcium]; Codeine; Penicillins; and Valtrex [valacyclovir hcl]   Social History   Tobacco Use  . Smoking status: Never Smoker  . Smokeless tobacco: Never Used  Substance Use Topics  . Alcohol use: No    Alcohol/week: 0.0 standard drinks  . Drug use: No     Family Hx: The patient's family history includes Brain cancer in her father; Hypertension in her father and mother; Mitral valve prolapse in her mother.  ROS:   Please see the history of present illness.    No fevers chills nausea vomiting syncope bleeding orthopnea PND All other systems reviewed and are negative.   Prior CV studies:   The following studies were reviewed today:  Echocardiogram July 2019-EF 65%  Labs/Other Tests and Data Reviewed:    EKG:  An ECG dated 01/31/18 was personally reviewed today and demonstrated:  SR, RBBB  Recent Labs: 07/26/2018: Hemoglobin 12.9; Platelets 297.0; Pro B Natriuretic peptide (BNP) 21.0; TSH 2.00 12/22/2018: ALT 19; BUN 10; Creatinine,  Ser 0.85; Potassium 4.3; Sodium 141   Recent Lipid Panel Lab Results  Component Value Date/Time   CHOL 107 07/31/2018 09:18 AM   TRIG 102 07/31/2018 09:18 AM   HDL 33 (L) 07/31/2018 09:18 AM   CHOLHDL 3.2 07/31/2018 09:18 AM   CHOLHDL 6.3 01/02/2017 01:44 AM   LDLCALC 54 07/31/2018 09:18 AM   LDLDIRECT 151.0 09/09/2011 09:19 AM    Wt Readings from Last 3 Encounters:  01/01/19 150 lb (68 kg)  12/20/18 151 lb 9.6 oz (68.8 kg)  10/30/18 147 lb 9.6 oz (67 kg)     Objective:    Vital Signs:  BP 140/76   Pulse 67   Ht 5' 2"  (1.575 m)   Wt 150 lb (68 kg)   LMP  (LMP Unknown)   BMI 27.44 kg/m    VITAL SIGNS:  reviewed  Comfortable on phone, normal respiratory effort, alert, pleasant  ASSESSMENT & PLAN:    Coronary artery disease - Prior myocardial infarction May 2018.  LAD stents x2.  Aspirin only at this point.  No active chest pain.  Cardiac rehab maintenance program. Misses program during COVID-19  Chronic fatigue/ hypokalemia -Extensive work-up.  Certainly could be multifactorial. Last check potassium 4.3. KCL 40 BID. Feels better. HCTZ? Culprit. Could try at a later time spironolactone instead of HCTZ and that way be able to hopefully stop KCL. Don't want to make any abrupt changes right now.  Hyperlipidemia -Continue with statin for secondary risk factor prevention. High intensity dose.   Intermittent ankle swelling -Could be salt load related. Has not had in a while. Happened mostly at [facility].   COVID-19 Education: The signs and symptoms of COVID-19 were discussed with the patient and how to seek care for testing (follow up with PCP or arrange E-visit).  The importance of social distancing was discussed today.  Time:   Today, I have spent 17 minutes with the patient with telehealth technology discussing the above problems.     Medication Adjustments/Labs and Tests Ordered: Current medicines are reviewed at length with the patient today.  Concerns regarding  medicines are outlined above.   Tests Ordered: No orders of the defined types were placed in this encounter.   Medication Changes: Meds ordered this encounter  Medications  . furosemide (LASIX) 20 MG tablet    Sig: Take 1  tablet (20 mg total) by mouth daily as needed.    Dispense:  90 tablet    Refill:  3  . potassium chloride SA (K-DUR) 20 MEQ tablet    Sig: Take 2 tablets (40 mEq total) by mouth 2 (two) times daily.    Dispense:  360 tablet    Refill:  3    Disposition:  Follow up in 6 month(s)  Signed, Candee Furbish, MD  01/01/2019 11:52 AM     [facility]

## 2019-01-01 NOTE — Patient Instructions (Signed)
Medication Instructions:  The current medical regimen is effective;  continue present plan and medications.  If you need a refill on your cardiac medications before your next appointment, please call your pharmacy.   Follow-Up: Follow up in 6 months with Truitt Merle, NP and 1 year with Dr. Marlou Porch.  You will receive a letter in the mail 2 months before you are due.  Please call us when you receive this letter to schedule your follow up appointment.  Thank you for choosing [facility]!!

## 2019-01-03 ENCOUNTER — Encounter (HOSPITAL_COMMUNITY): Payer: Self-pay

## 2019-01-03 IMAGING — DX DG LUMBAR SPINE COMPLETE 4+V
5 series · 5 of 5 positions shown · non-contrast
Comparison: Lumbar spine MRI 10/25/2015

CLINICAL DATA: Back pain for 3 days, acute onset, no injury

EXAM:
LUMBAR SPINE - COMPLETE 4+ VIEW

[lumbar spine ap]
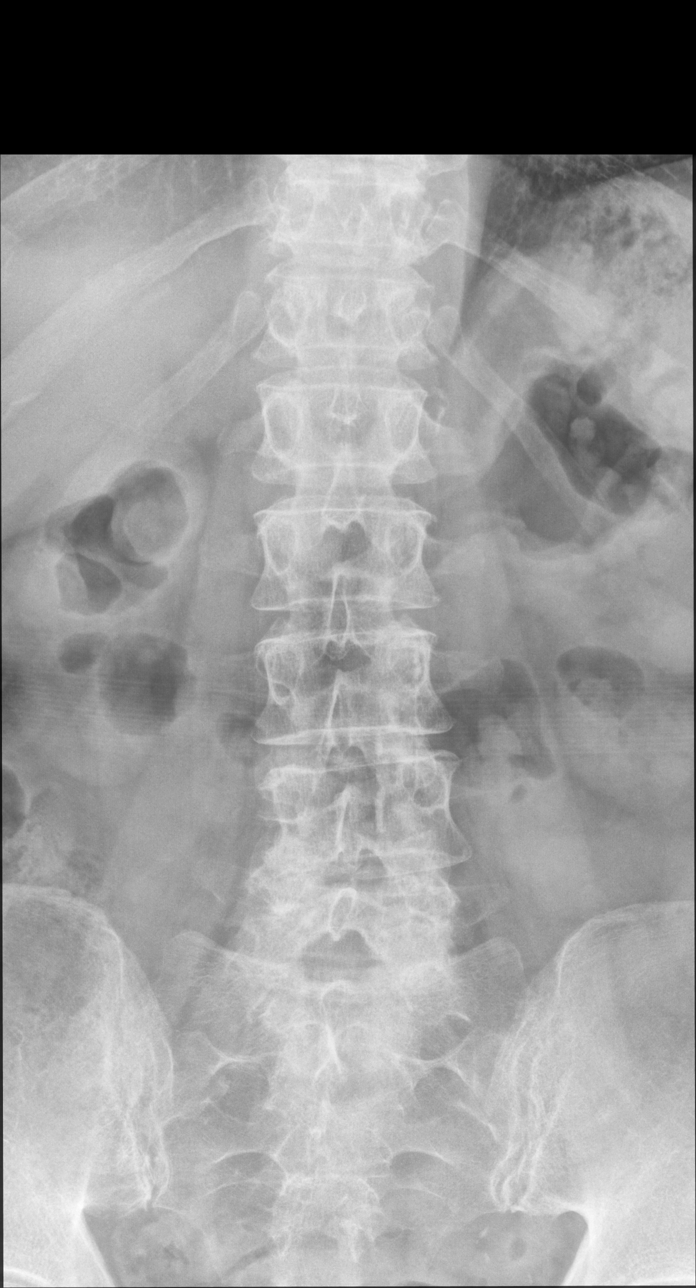

[lumbar spine lmo (1 of 2)]
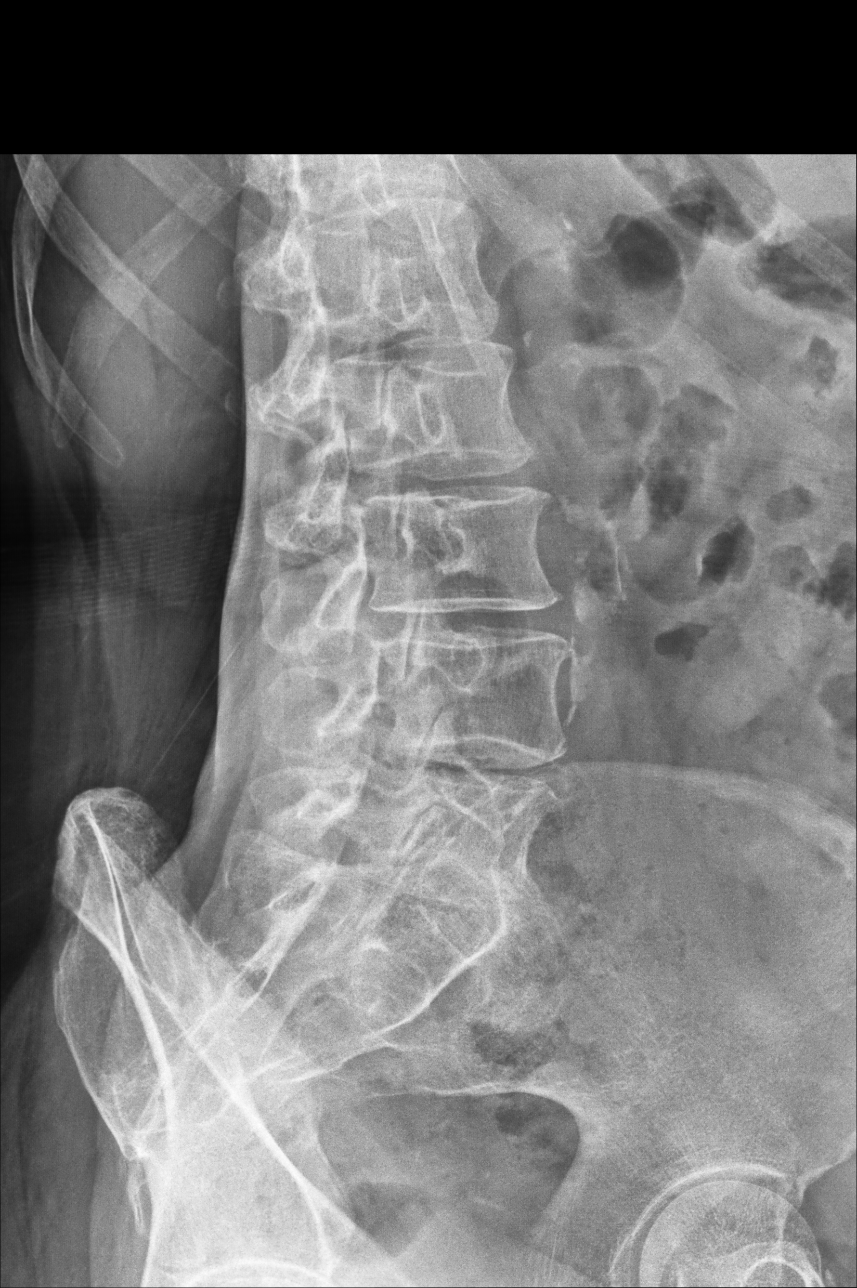

[lumbar spine lmo (2 of 2)]
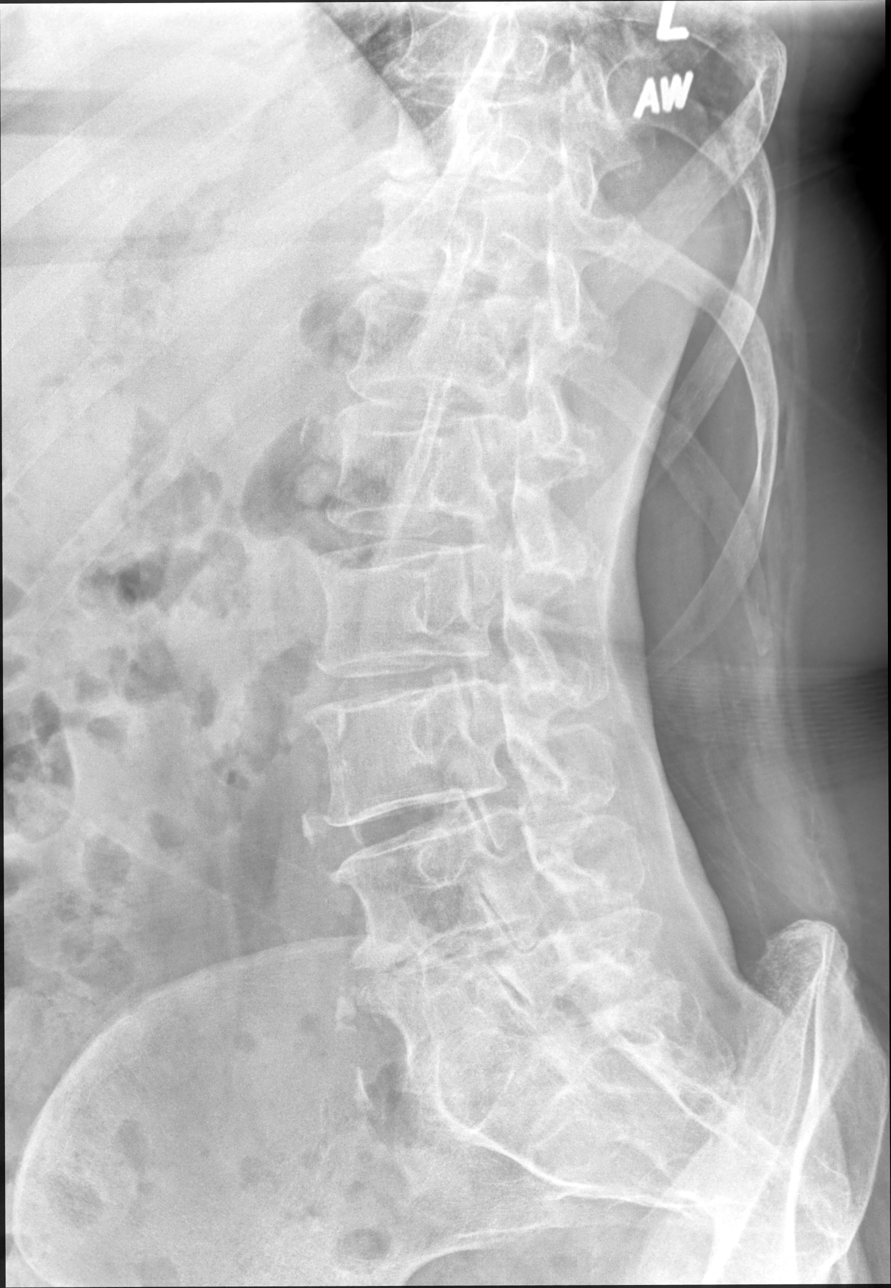

[lumbar spine lat (1 of 2)]
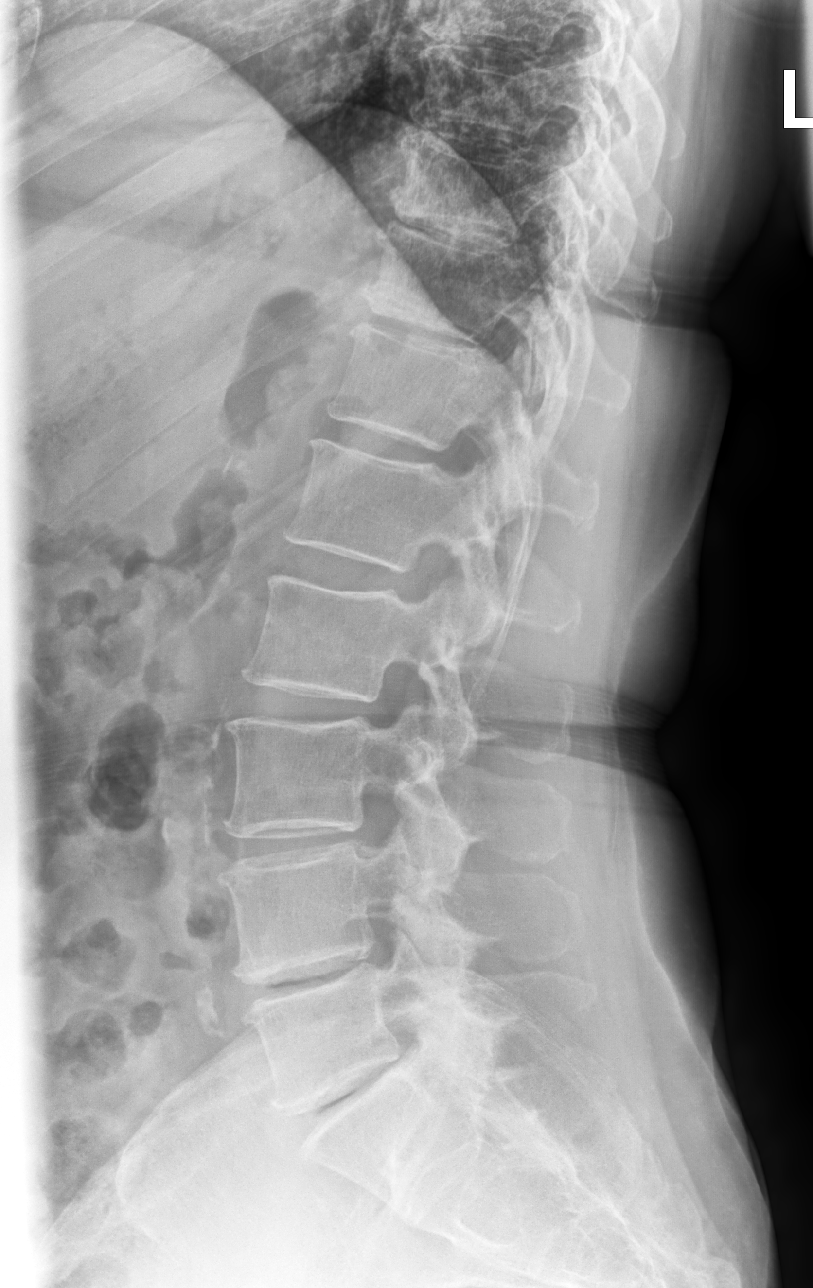

[lumbar spine lat (2 of 2)]
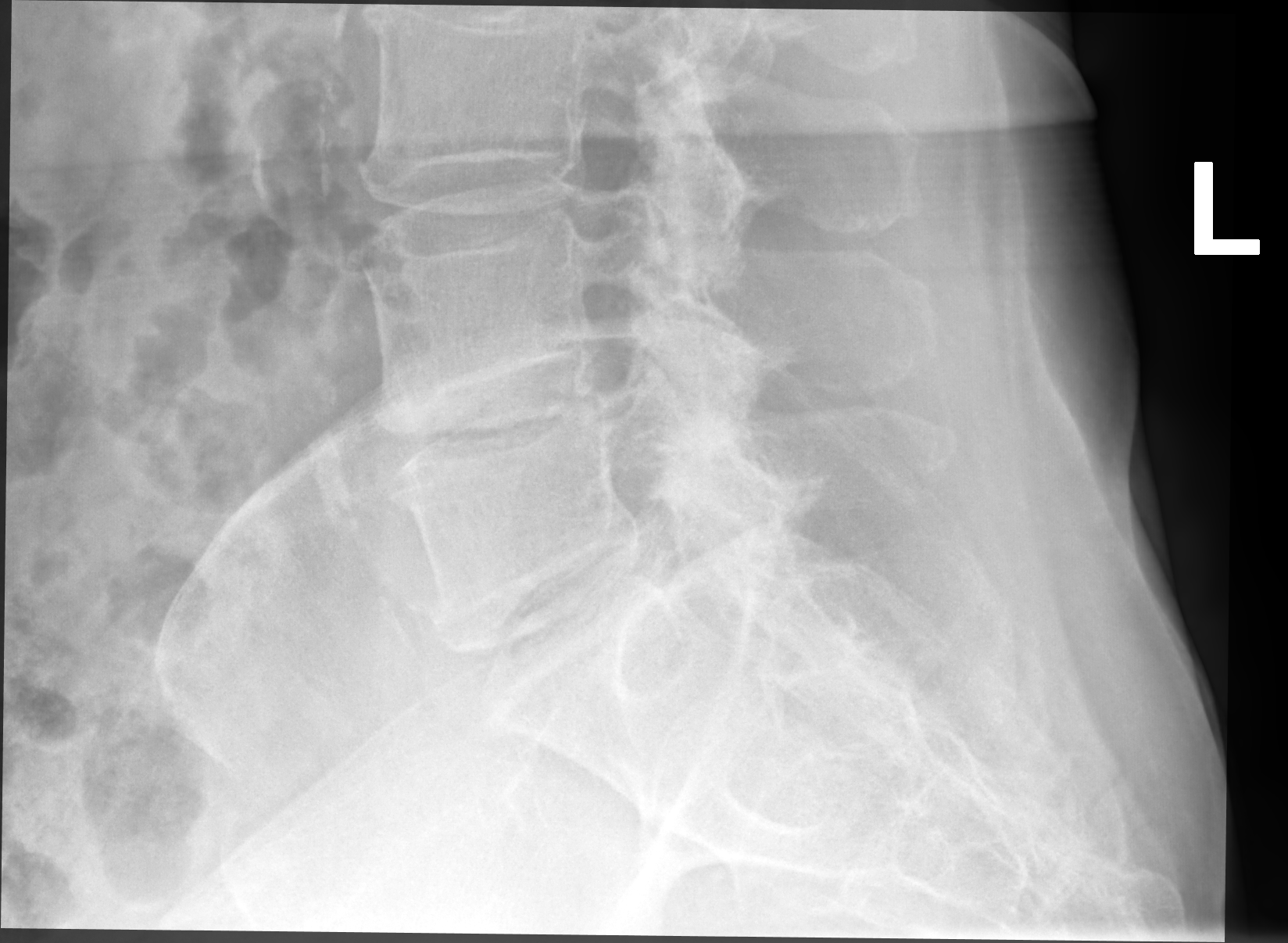

[5 of 5 positions shown; findings below may reference images not displayed]

FINDINGS: Five non-rib-bearing lumbar vertebra.

Minimal broad-based dextroconvex thoracolumbar scoliosis.

Disc space narrowing L3-L4 through L5-S1 with vacuum phenomenon at
L4-L5 and L5-S1.

Scattered mild endplate spur formation inferior thoracic and
inferior lumbar spine.

Vertebral body heights maintained.

No fracture, subluxation, or bone destruction.

Mild facet degenerative changes lower lumbar spine.

No spondylolysis.
IMPRESSION: Degenerative disc facet disease changes of the lumbar spine as
above.

No acute abnormalities.

## 2019-01-05 ENCOUNTER — Encounter (HOSPITAL_COMMUNITY): Payer: Self-pay

## 2019-01-10 ENCOUNTER — Encounter (HOSPITAL_COMMUNITY): Payer: Self-pay

## 2019-01-12 ENCOUNTER — Encounter (HOSPITAL_COMMUNITY): Payer: Self-pay

## 2019-01-15 ENCOUNTER — Encounter (HOSPITAL_COMMUNITY): Payer: Self-pay

## 2019-01-17 ENCOUNTER — Encounter (HOSPITAL_COMMUNITY): Payer: Self-pay

## 2019-01-19 ENCOUNTER — Encounter (HOSPITAL_COMMUNITY): Payer: Self-pay

## 2019-01-22 ENCOUNTER — Encounter (HOSPITAL_COMMUNITY): Payer: Self-pay

## 2019-01-24 ENCOUNTER — Ambulatory Visit: Payer: PPO | Admitting: Cardiology

## 2019-01-24 ENCOUNTER — Encounter (HOSPITAL_COMMUNITY): Payer: Self-pay

## 2019-01-26 ENCOUNTER — Encounter (HOSPITAL_COMMUNITY): Payer: Self-pay

## 2019-01-29 ENCOUNTER — Encounter (HOSPITAL_COMMUNITY): Payer: Self-pay

## 2019-01-31 ENCOUNTER — Encounter (HOSPITAL_COMMUNITY): Payer: Self-pay

## 2019-02-02 ENCOUNTER — Encounter (HOSPITAL_COMMUNITY): Payer: Self-pay

## 2019-02-05 ENCOUNTER — Encounter (HOSPITAL_COMMUNITY): Payer: Self-pay

## 2019-02-07 ENCOUNTER — Encounter (HOSPITAL_COMMUNITY): Payer: Self-pay

## 2019-02-09 ENCOUNTER — Encounter (HOSPITAL_COMMUNITY): Payer: Self-pay

## 2019-02-09 DIAGNOSIS — H04123 Dry eye syndrome of bilateral lacrimal glands: Secondary | ICD-10-CM | POA: Diagnosis not present

## 2019-02-09 DIAGNOSIS — Z9842 Cataract extraction status, left eye: Secondary | ICD-10-CM | POA: Diagnosis not present

## 2019-02-09 DIAGNOSIS — Z9841 Cataract extraction status, right eye: Secondary | ICD-10-CM | POA: Diagnosis not present

## 2019-02-09 DIAGNOSIS — H5213 Myopia, bilateral: Secondary | ICD-10-CM | POA: Diagnosis not present

## 2019-02-12 ENCOUNTER — Encounter (HOSPITAL_COMMUNITY): Payer: Self-pay

## 2019-02-14 ENCOUNTER — Encounter (HOSPITAL_COMMUNITY): Payer: Self-pay

## 2019-02-19 ENCOUNTER — Encounter (HOSPITAL_COMMUNITY): Payer: Self-pay

## 2019-02-21 ENCOUNTER — Encounter (HOSPITAL_COMMUNITY): Payer: Self-pay

## 2019-02-22 ENCOUNTER — Other Ambulatory Visit: Payer: Self-pay | Admitting: Family Medicine

## 2019-02-23 ENCOUNTER — Encounter (HOSPITAL_COMMUNITY): Payer: Self-pay

## 2019-02-26 ENCOUNTER — Encounter (HOSPITAL_COMMUNITY): Payer: Self-pay

## 2019-02-28 ENCOUNTER — Encounter (HOSPITAL_COMMUNITY): Payer: Self-pay

## 2019-03-02 ENCOUNTER — Encounter (HOSPITAL_COMMUNITY): Payer: Self-pay

## 2019-03-05 ENCOUNTER — Encounter (HOSPITAL_COMMUNITY): Payer: Self-pay

## 2019-03-07 ENCOUNTER — Encounter (HOSPITAL_COMMUNITY): Payer: Self-pay

## 2019-03-09 ENCOUNTER — Encounter (HOSPITAL_COMMUNITY): Payer: Self-pay

## 2019-03-12 ENCOUNTER — Encounter (HOSPITAL_COMMUNITY): Payer: Self-pay

## 2019-03-14 ENCOUNTER — Encounter (HOSPITAL_COMMUNITY): Payer: Self-pay

## 2019-03-16 ENCOUNTER — Encounter (HOSPITAL_COMMUNITY): Payer: Self-pay

## 2019-03-19 ENCOUNTER — Encounter (HOSPITAL_COMMUNITY): Payer: Self-pay

## 2019-03-21 ENCOUNTER — Encounter (HOSPITAL_COMMUNITY): Payer: Self-pay

## 2019-03-23 ENCOUNTER — Encounter (HOSPITAL_COMMUNITY): Payer: Self-pay

## 2019-03-26 ENCOUNTER — Encounter (HOSPITAL_COMMUNITY): Payer: Self-pay

## 2019-03-28 ENCOUNTER — Encounter (HOSPITAL_COMMUNITY): Payer: Self-pay

## 2019-03-30 ENCOUNTER — Encounter (HOSPITAL_COMMUNITY): Payer: Self-pay

## 2019-04-02 ENCOUNTER — Encounter (HOSPITAL_COMMUNITY): Payer: Self-pay

## 2019-04-02 ENCOUNTER — Other Ambulatory Visit: Payer: Self-pay

## 2019-04-02 ENCOUNTER — Ambulatory Visit: Payer: PPO | Admitting: Family Medicine

## 2019-04-02 ENCOUNTER — Encounter: Payer: Self-pay | Admitting: Family Medicine

## 2019-04-02 ENCOUNTER — Ambulatory Visit: Payer: Self-pay

## 2019-04-02 VITALS — BP 111/69 | HR 66 | Ht 62.0 in | Wt 149.0 lb

## 2019-04-02 DIAGNOSIS — M65332 Trigger finger, left middle finger: Secondary | ICD-10-CM | POA: Diagnosis not present

## 2019-04-02 MED ORDER — TRIAMCINOLONE ACETONIDE 40 MG/ML IJ SUSP
40.0000 mg | Freq: Once | INTRAMUSCULAR | Status: AC
  Administered 2019-04-02: 40 mg via INTRA_ARTICULAR

## 2019-04-02 MED ORDER — DICLOFENAC SODIUM 1 % TD GEL: 4.0000 g | Freq: Four times a day (QID) | TRANSDERMAL | 2 refills | Status: DC

## 2019-04-02 NOTE — Progress Notes (Signed)
Hannah Vasquez - 68 y.o. female MRN [id number]  Date of birth: 1951/04/09  SUBJECTIVE:  Including CC & ROS.  Chief Complaint  Patient presents with  . Hand Pain    left hand middle finger    Hannah Vasquez is a 68 y.o. female that is  Presenting with trigger finger on the left middle finger. Symptoms are acute on chronic in nature. Has received steroid injection in the past. Symptoms resolved for a period of time with previous injection. Her symptoms started about 3-4 weeks ago. Pain was severe last week. More mild today. Localized to the palmer aspect of the left hand. Has had to pry her finger open in the morning. No numbness or tingling.   Review of Systems  Constitutional: Negative for fever.  HENT: Negative for congestion.   Respiratory: Negative for cough.   Cardiovascular: Negative for chest pain.  Gastrointestinal: Negative for abdominal pain.  Musculoskeletal: Positive for arthralgias.  Neurological: Negative for weakness.  Hematological: Negative for adenopathy.    HISTORY: Past Medical, Surgical, Social, and Family History Reviewed & Updated per EMR.   Pertinent Historical Findings include:  Past Medical History:  Diagnosis Date  . Allergy   . Anxiety   . CAD (coronary artery disease)   . Cataract   . Hemorrhoids   . History of MI (myocardial infarction) 10/07/2017  . Hypertension   . Mitral valve prolapse   . Osteoporosis   . Status post dilation of esophageal narrowing     Past Surgical History:  Procedure Laterality Date  . BASAL CELL CARCINOMA EXCISION  02/05/2016   Dr. Sarajane Jews, [facility]  . BLADDER SURGERY  11/15/1995   bladder neck suspension, urinary incontinence  . CARDIAC CATHETERIZATION  01/03/2017 DES LAD  . CATARACT EXTRACTION Left 04/30/2016   Dr. Tommy Rainwater, [place] N/A 01/03/2017   Procedure: Coronary Stent Intervention;  Surgeon: Nelva Bush, MD;  Location: [place] [facility];  Service:  Cardiovascular;  Laterality: N/A;  . DENTAL SURGERY  06/20/2016   Tooth Implant; Dr. Kalman Shan  . INTRAVASCULAR ULTRASOUND/IVUS N/A 01/03/2017   Procedure: Intravascular Ultrasound/IVUS;  Surgeon: Nelva Bush, MD;  Location: [place] [facility];  Service: Cardiovascular;  Laterality: N/A;  . KNEE SURGERY  2003   torn meniscus  . LEFT HEART CATH AND CORONARY ANGIOGRAPHY N/A 01/03/2017   Procedure: Left Heart Cath and Coronary Angiography;  Surgeon: Nelva Bush, MD;  Location: [place] [facility];  Service: Cardiovascular;  Laterality: N/A;  . MOHS SURGERY Right 2017   right side of face  . ROTATOR CUFF REPAIR    . SHOULDER SURGERY Right 2005    Allergies  Allergen Reactions  . Bactroban [Mupirocin Calcium]     Facial redness and swelling  . Codeine     REACTION: nausea and vomiting  . Penicillins Nausea And Vomiting    REACTION: rash  . Valtrex [Valacyclovir Hcl] Swelling    Per pt causes redness    Family History  Problem Relation Age of Onset  . Mitral valve prolapse Mother   . Hypertension Mother   . Hypertension Father   . Brain cancer Father      Social History   Socioeconomic History  . Marital status: Divorced    Spouse name: Not on file  . Number of children: 2  . Years of education: Not on file  . Highest education level: Not on file  Occupational History  . 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 Financial resource strain: Not on file  . Food insecurity    Worry: Not on file    Inability: Not on file  . Transportation needs    Medical: Not on file    Non-medical: Not on file  Tobacco Use  . Smoking status: Never Smoker  . Smokeless tobacco: Never Used  Substance and Sexual Activity  . Alcohol use: No    Alcohol/week: 0.0 standard drinks  . Drug use: No  . Sexual activity: Not Currently  Lifestyle  . Physical activity    Days per week: Not on file    Minutes per session: Not on file  . Stress: Not on  file  Relationships  . Social Herbalist on phone: Not on file    Gets together: Not on file    Attends religious service: Not on file    Active member of club or organization: Not on file    Attends meetings of clubs or organizations: Not on file    Relationship status: Not on file  . Intimate partner violence    Fear of current or ex partner: Not on file    Emotionally abused: Not on file    Physically abused: Not on file    Forced sexual activity: Not on file  Other Topics Concern  . Not on file  Social History Narrative   Has a living will.   HPOA- daughter.   She would desire CPR.  Would desire life support if necessary and not futile.           PHYSICAL EXAM:  VS: BP 111/69   Pulse 66   Ht 5' 2"  (1.575 m)   Wt 149 lb (67.6 kg)   LMP  (LMP Unknown)   BMI 27.25 kg/m  Physical Exam Gen: NAD, alert, cooperative with exam, well-appearing ENT: normal lips, normal nasal mucosa,  Eye: normal EOM, normal conjunctiva and lids CV:  no edema, +2 pedal pulses   Resp: no accessory muscle use, non-labored,  Skin: no rashes, no areas of induration  Neuro: normal tone, normal sensation to touch Psych:  normal insight, alert and oriented MSK:  Left hand:  TTP over the middle flexor tendon.  Mild triggering of middle finger No signs of atrophy  NVI   Aspiration/Injection Procedure Note Hannah Vasquez 1950/12/05  Procedure: Injection Indications: left trigger finger  Procedure Details Consent: Risks of procedure as well as the alternatives and risks of each were explained to the (patient/caregiver).  Consent for procedure obtained. Time Out: Verified patient identification, verified procedure, site/side was marked, verified correct patient position, special equipment/implants available, medications/allergies/relevent history reviewed, required imaging and test results available.  Performed.  The area was cleaned with iodine and alcohol swabs.    The left middle  finger trigger finger was injected using 1 cc's of 40 mg Kenalog and 1 cc's of 0.25% bupivacaine with a 25 1 1/2" needle.  Ultrasound was used. Images were obtained in long views showing the injection.     A sterile dressing was applied.  Patient did tolerate procedure well.        ASSESSMENT & PLAN:   Trigger finger, left middle finger Pain most likely related to the triggering that she has been experiencing. - Injection performed today. -Counseled on splinting. -If no improvement he consider surgery.

## 2019-04-02 NOTE — Patient Instructions (Signed)
Good to see you Please wrap the finger at night. You can also use paper tape. Do this for 3 weeks. Also wrap the finger if you are doing any repetitive activities.  Please try voltaren gel which is over the counter.   Please send me a message in MyChart with any questions or updates.  Please see me back in 4 weeks.   --Dr. Raeford Razor

## 2019-04-02 NOTE — Addendum Note (Signed)
Addended by: Rosemarie Ax on: 04/02/2019 11:48 AM   Modules accepted: Orders

## 2019-04-02 NOTE — Assessment & Plan Note (Signed)
Pain most likely related to the triggering that she has been experiencing. - Injection performed today. -Counseled on splinting. -If no improvement he consider surgery.

## 2019-04-04 ENCOUNTER — Encounter (HOSPITAL_COMMUNITY): Payer: Self-pay

## 2019-04-06 ENCOUNTER — Encounter (HOSPITAL_COMMUNITY): Payer: Self-pay

## 2019-04-06 ENCOUNTER — Telehealth: Payer: Self-pay

## 2019-04-06 NOTE — Telephone Encounter (Signed)
Questions for Screening COVID-19  Symptom onset: None  Travel or Contacts: None  During this illness, did/does the patient experience any of the following symptoms? Fever >100.6F []   Yes [x]   No []   Unknown Subjective fever (felt feverish) []   Yes [x]   No []   Unknown Chills []   Yes [x]   No []   Unknown Muscle aches (myalgia) []   Yes [x]   No []   Unknown Runny nose (rhinorrhea) []   Yes [x]   No []   Unknown Sore throat []   Yes [x]   No []   Unknown Cough (new onset or worsening of chronic cough) []   Yes [x]   No []   Unknown Shortness of breath (dyspnea) []   Yes [x]   No []   Unknown Nausea or vomiting []   Yes [x]   No []   Unknown Headache []   Yes [x]   No []   Unknown Abdominal pain  []   Yes [x]   No []   Unknown Diarrhea (?3 loose/looser than normal stools/24hr period) []   Yes [x]   No []   Unknown Other, specify:  Patient risk factors: Smoker? []   Current []   Former []   Never If female, currently pregnant? []   Yes []   No  Patient Active Problem List   Diagnosis Date Noted  . Trigger finger, left middle finger 10/30/2018  . E. coli UTI (urinary tract infection) 09/05/2018  . Lumbar back pain with radiculopathy affecting lower extremity 05/22/2018  . Osteoporosis 05/15/2018  . Lumbar radiculopathy 04/27/2018  . GERD (gastroesophageal reflux disease) 04/10/2018  . Allergic reaction caused by a drug 11/02/2017  . Dyspnea on exertion 08/21/2017  . DDD (degenerative disc disease), thoracolumbar 04/28/2017  . Back pain 04/21/2017  . Hypokalemia 01/06/2017  . NSTEMI (non-ST elevated myocardial infarction) ([place]) 01/01/2017  . Fatigue 11/27/2014  . RBBB 12/19/2013  . Mitral valve prolapse   . HYPERTENSION, BENIGN 08/27/2010  . Anxiety state 06/27/2009  . Coronary atherosclerosis 06/27/2009  . Mitral valve disorder 06/27/2009  . HEMORRHOIDS 06/27/2009  . Allergic rhinitis 06/27/2009  . Disorder of bone and cartilage 11/11/2006    Plan:  []   High risk for COVID-19 with red flags go to ED  (with CP, SOB, weak/lightheaded, or fever > 101.5). Call ahead.  []   High risk for COVID-19 but stable. Inform provider and coordinate time for [facility] visit.   []   No red flags but URI signs or symptoms okay for [facility] visit.

## 2019-04-09 ENCOUNTER — Encounter: Payer: Self-pay | Admitting: Family Medicine

## 2019-04-09 ENCOUNTER — Ambulatory Visit (INDEPENDENT_AMBULATORY_CARE_PROVIDER_SITE_OTHER): Payer: PPO | Admitting: Family Medicine

## 2019-04-09 ENCOUNTER — Other Ambulatory Visit: Payer: Self-pay

## 2019-04-09 ENCOUNTER — Encounter (HOSPITAL_COMMUNITY): Payer: Self-pay

## 2019-04-09 VITALS — BP 128/70 | HR 62 | Temp 97.9°F | Ht 62.0 in | Wt 152.2 lb

## 2019-04-09 DIAGNOSIS — M79641 Pain in right hand: Secondary | ICD-10-CM

## 2019-04-09 DIAGNOSIS — M65332 Trigger finger, left middle finger: Secondary | ICD-10-CM

## 2019-04-09 DIAGNOSIS — Z23 Encounter for immunization: Secondary | ICD-10-CM | POA: Diagnosis not present

## 2019-04-09 DIAGNOSIS — M79642 Pain in left hand: Secondary | ICD-10-CM

## 2019-04-09 DIAGNOSIS — H9193 Unspecified hearing loss, bilateral: Secondary | ICD-10-CM

## 2019-04-09 NOTE — Assessment & Plan Note (Signed)
Hearing loss evident on exam today.  Refer to audiology. The patient indicates understanding of these issues and agrees with the plan.

## 2019-04-09 NOTE — Patient Instructions (Signed)
We are referring you to an audiologist.  Please update me with your hand- I would like to refer you to Dr. Fredna Dow.   I have placed the referral.

## 2019-04-09 NOTE — Progress Notes (Signed)
Subjective:   Patient ID: Hannah Vasquez, female    DOB: Feb 14, 1951, 68 y.o.   MRN: [id number]  Hannah Vasquez is a pleasant 68 y.o. year old female who presents to clinic today with Arthritis (arthritis in both hands/ left hand is worse/ went to see Dr. Raeford Razor given an injection feels little better/ pt states hearing is getting worse./ wants flu shot )  on 04/09/2019  HPI: Bilateral hand pain- left > right. She did see Dr. Raeford Razor who injected her trigger finger which has helped some.  Bilateral hearing loss-  Progressing over the past year. It's getting worse and embarrassing to have conversations.     . Current Outpatient Medications on File Prior to Visit  Medication Sig Dispense Refill  . acetaminophen (TYLENOL) 500 MG tablet Take 1 tablet (500 mg total) by mouth 2 (two) times daily. 60 tablet 0  . albuterol (PROVENTIL HFA;VENTOLIN HFA) 108 (90 Base) MCG/ACT inhaler Inhale 1 puff into the lungs every 6 (six) hours as needed for wheezing or shortness of breath. 1 Inhaler PRN  . amLODipine (NORVASC) 5 MG tablet Take 1 tablet (5 mg total) by mouth daily. 90 tablet 3  . aspirin EC 81 MG tablet Take 81 mg by mouth every morning.     Marland Kitchen atorvastatin (LIPITOR) 80 MG tablet TAKE 1 TABLET BY MOUTH ONCE A DAY AT 6PM 90 tablet 3  . Calcium-Magnesium-Vitamin D (CALCIUM 1200+D3 PO) Take 1,200 mg by mouth.    . Cholecalciferol (VITAMIN D3) 1000 UNITS CAPS Take 1 capsule by mouth every morning.     . diclofenac sodium (VOLTAREN) 1 % GEL Apply 4 g topically 4 (four) times daily. To affected joint. 100 g 2  . fluticasone (FLONASE) 50 MCG/ACT nasal spray Place 1 spray into both nostrils as needed.     . furosemide (LASIX) 20 MG tablet Take 1 tablet (20 mg total) by mouth daily as needed. 90 tablet 3  . gabapentin (NEURONTIN) 300 MG capsule Take 1 capsule (300 mg total) by mouth 3 (three) times daily. (Patient taking differently: Take 300 mg by mouth 2 (two) times a day. ) 90 capsule 3  . isosorbide  mononitrate (IMDUR) 30 MG 24 hr tablet Take 1 tablet (30 mg total) by mouth daily. 90 tablet 3  . loratadine (CLARITIN) 10 MG tablet Take 1 tablet (10 mg total) by mouth every morning. 30 tablet 2  . losartan (COZAAR) 100 MG tablet Take 1 tablet (100 mg total) by mouth daily. 90 tablet 3  . metoprolol succinate (TOPROL-XL) 25 MG 24 hr tablet Take 1 tablet (25 mg total) by mouth daily. 90 tablet 3  . Multiple Vitamin (MULTIVITAMIN) tablet Take 1 tablet by mouth every morning.     . nitroGLYCERIN (NITROSTAT) 0.4 MG SL tablet Place 1 tablet (0.4 mg total) under the tongue every 5 (five) minutes as needed for chest pain. 25 tablet 3  . Omega-3 Fatty Acids (FISH OIL) 1200 MG CAPS Take 1 capsule by mouth every morning.     Marland Kitchen omeprazole (PRILOSEC) 40 MG capsule Take 40 mg by mouth daily.     Marland Kitchen oxyCODONE (OXY IR/ROXICODONE) 5 MG immediate release tablet Take 1 tablet (5 mg total) by mouth every 4 (four) hours as needed for severe pain. 30 tablet 0  . potassium chloride SA (K-DUR) 20 MEQ tablet Take 2 tablets (40 mEq total) by mouth 2 (two) times daily. 360 tablet 3  . tiZANidine (ZANAFLEX) 4 MG tablet Take 0.5 mg by  mouth once. Take 1/5-1tid prn    . traMADol (ULTRAM) 50 MG tablet TAKE 1 TABLET BY MOUTH 4 TIMES DAILY AS NEEDED 90 tablet 0  . vitamin B-12 (CYANOCOBALAMIN) 100 MCG tablet Take 1 tablet (100 mcg total) by mouth daily.    . hydrochlorothiazide (HYDRODIURIL) 25 MG tablet Take 1 tablet (25 mg total) by mouth daily. 90 tablet 3   No current facility-administered medications on file prior to visit.     Allergies  Allergen Reactions  . Bactroban [Mupirocin Calcium]     Facial redness and swelling  . Codeine     REACTION: nausea and vomiting  . Penicillins Nausea And Vomiting    REACTION: rash  . Valtrex [Valacyclovir Hcl] Swelling    Per pt causes redness    Past Medical History:  Diagnosis Date  . Allergy   . Anxiety   . CAD (coronary artery disease)   . Cataract   . Hemorrhoids    . History of MI (myocardial infarction) 10/07/2017  . Hypertension   . Mitral valve prolapse   . Osteoporosis   . Status post dilation of esophageal narrowing     Past Surgical History:  Procedure Laterality Date  . BASAL CELL CARCINOMA EXCISION  02/05/2016   Dr. Sarajane Jews, [facility]  . BLADDER SURGERY  11/15/1995   bladder neck suspension, urinary incontinence  . CARDIAC CATHETERIZATION  01/03/2017 DES LAD  . CATARACT EXTRACTION Left 04/30/2016   Dr. Tommy Rainwater, [place] N/A 01/03/2017   Procedure: Coronary Stent Intervention;  Surgeon: Nelva Bush, MD;  Location: [place] [facility];  Service: Cardiovascular;  Laterality: N/A;  . DENTAL SURGERY  06/20/2016   Tooth Implant; Dr. Kalman Shan  . INTRAVASCULAR ULTRASOUND/IVUS N/A 01/03/2017   Procedure: Intravascular Ultrasound/IVUS;  Surgeon: Nelva Bush, MD;  Location: [name] [facility];  Service: Cardiovascular;  Laterality: N/A;  . KNEE SURGERY  2003   torn meniscus  . LEFT HEART CATH AND CORONARY ANGIOGRAPHY N/A 01/03/2017   Procedure: Left Heart Cath and Coronary Angiography;  Surgeon: Nelva Bush, MD;  Location: [place] [facility];  Service: Cardiovascular;  Laterality: N/A;  . MOHS SURGERY Right 2017   right side of face  . ROTATOR CUFF REPAIR    . SHOULDER SURGERY Right 2005    Family History  Problem Relation Age of Onset  . Mitral valve prolapse Mother   . Hypertension Mother   . Hypertension Father   . Brain cancer Father     Social History   Socioeconomic History  . Marital status: Divorced    Spouse name: Not on file  . Number of children: 2  . Years of education: Not on file  . Highest education level: Not on file  Occupational History  . 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 Financial resource strain: Not on file  . Food insecurity    Worry: Not on file    Inability: Not on file  . Transportation needs     Medical: Not on file    Non-medical: Not on file  Tobacco Use  . Smoking status: Never Smoker  . Smokeless tobacco: Never Used  Substance and Sexual Activity  . Alcohol use: No    Alcohol/week: 0.0 standard drinks  . Drug use: No  . Sexual activity: Not Currently  Lifestyle  . Physical activity    Days per week: Not on file    Minutes per session: Not on  file  . Stress: Not on file  Relationships  . Social Herbalist on phone: Not on file    Gets together: Not on file    Attends religious service: Not on file    Active member of club or organization: Not on file    Attends meetings of clubs or organizations: Not on file    Relationship status: Not on file  . Intimate partner violence    Fear of current or ex partner: Not on file    Emotionally abused: Not on file    Physically abused: Not on file    Forced sexual activity: Not on file  Other Topics Concern  . Not on file  Social History Narrative   Has a living will.   HPOA- daughter.   She would desire CPR.  Would desire life support if necessary and not futile.         The PMH, PSH, Social History, Family History, Medications, and allergies have been reviewed in [facility], and have been updated if relevant.  Review of Systems  Constitutional: Negative.   HENT: Positive for hearing loss. Negative for ear pain.   Eyes: Negative.   Respiratory: Negative.   Cardiovascular: Negative.   Gastrointestinal: Negative.   Endocrine: Negative.   Genitourinary: Negative.   Musculoskeletal: Positive for joint swelling.  Allergic/Immunologic: Negative.   Neurological: Negative.   Hematological: Negative.   Psychiatric/Behavioral: Negative.   All other systems reviewed and are negative.      Objective:    BP 128/70   Pulse 62   Temp 97.9 F (36.6 C) (Oral)   Ht 5' 2"  (1.575 m)   Wt 152 lb 3.2 oz (69 kg)   LMP  (LMP Unknown)   SpO2 99%   BMI 27.84 kg/m    Physical Exam Vitals signs and nursing note  reviewed.  Constitutional:      General: She is not in acute distress.    Appearance: Normal appearance. She is not toxic-appearing.  HENT:     Head: Normocephalic and atraumatic.     Right Ear: There is no impacted cerumen.     Left Ear: There is no impacted cerumen.     Nose: Nose normal.     Mouth/Throat:     Mouth: Mucous membranes are moist.  Eyes:     Extraocular Movements: Extraocular movements intact.  Neck:     Musculoskeletal: Normal range of motion.  Cardiovascular:     Rate and Rhythm: Normal rate.     Pulses: Normal pulses.  Pulmonary:     Effort: Pulmonary effort is normal.  Musculoskeletal:     Comments: Left 3rd digit in a split  Skin:    General: Skin is warm and dry.  Neurological:     General: No focal deficit present.     Mental Status: She is alert and oriented to person, place, and time.  Psychiatric:        Mood and Affect: Mood normal.        Behavior: Behavior normal.        Thought Content: Thought content normal.        Judgment: Judgment normal.           Assessment & Plan:   Trigger finger, left middle finger - Plan: Ambulatory referral to Hand Surgery  Bilateral hand pain - Plan: Ambulatory referral to Hand Surgery  Bilateral hearing loss, unspecified hearing loss type - Plan: Ambulatory referral to Audiology  Need for  influenza vaccination - Plan: Flu Vaccine QUAD High Dose(Fluad) No follow-ups on file.

## 2019-04-09 NOTE — Assessment & Plan Note (Signed)
Since this has occurred several times, I do want Hannah Vasquez to see a hand specialist and she agrees- order placed for pt to see Dr. Fredna Dow. The patient indicates understanding of these issues and agrees with the plan. Orders Placed This Encounter  Procedures  . Flu Vaccine QUAD High Dose(Fluad)  . Ambulatory referral to Audiology  . Ambulatory referral to Hand Surgery

## 2019-04-10 ENCOUNTER — Other Ambulatory Visit: Payer: Self-pay | Admitting: Family Medicine

## 2019-04-10 MED ORDER — GABAPENTIN 300 MG PO CAPS: 300.0000 mg | ORAL_CAPSULE | Freq: Two times a day (BID) | ORAL | 3 refills | Status: DC

## 2019-04-10 MED ORDER — TRAMADOL HCL 50 MG PO TABS: ORAL_TABLET | ORAL | 0 refills | Status: DC

## 2019-04-10 NOTE — Telephone Encounter (Signed)
Dr. Deborra Medina please advise

## 2019-04-10 NOTE — Telephone Encounter (Signed)
Requested medications are due for refill today?  Yes  Requested medications are on the active medication list?  Yes  Last refill - gabapentin -08/15/2018 #90, 3 refills    Tramadol - 02/23/2019 #90, 0 refills   Future visit scheduled?  Not with PCP  Notes to clinic- patient reported taking gabapentin twice daily instead of 3 times daily.   Requested Prescriptions  Pending Prescriptions Disp Refills   gabapentin (NEURONTIN) 300 MG capsule 90 capsule 3    Sig: Take 1 capsule (300 mg total) by mouth 3 (three) times daily.     Neurology: Anticonvulsants - gabapentin Passed - 04/10/2019  9:23 AM      Passed - Valid encounter within last 12 months    Recent Outpatient Visits          Yesterday Trigger finger, left middle finger   [facility] Sent[facility], Marciano Sequin, MD   3 months ago Trigger finger, left middle finger   [facility] Rosemarie Ax, MD   5 months ago Trigger finger, left middle finger   [facility] Rosemarie Ax, MD   7 months ago E. coli UTI (urinary tract infection)   [facility] Senters, [place] M, MD   7 months ago Acute cystitis without hematuria   [facility] Senters, Marciano Sequin, MD      Future Appointments            In 2 weeks Vevelyn Royals, Parthenia Ames, RN [facility]            traMADol (ULTRAM) 50 MG tablet 90 tablet 0     Not Delegated - Analgesics:  Opioid Agonists Failed - 04/10/2019  9:23 AM      Failed - This refill cannot be delegated      Failed - Urine Drug Screen completed in last 360 days.      Passed - Valid encounter within last 6 months    Recent Outpatient Visits          Yesterday Trigger finger, left middle finger   [facility] Senters, Marciano Sequin, MD   3 months ago Trigger finger, left middle finger   [facility] Rosemarie Ax, MD   5 months ago Trigger finger, left middle  finger   [facility] Rosemarie Ax, MD   7 months ago E. coli UTI (urinary tract infection)   [facility] Loran Senters, Tanja [place] M, MD   7 months ago Acute cystitis without hematuria   [facility] Senters, Marciano Sequin, MD      Future Appointments            In 2 weeks Vevelyn Royals, Parthenia Ames, RN [facility] [place]

## 2019-04-10 NOTE — Telephone Encounter (Signed)
Medication Refill - Medication: traMADol (ULTRAM) 50 MG tablet/gabapentin (NEURONTIN) 300 MG capsule    Has the patient contacted their pharmacy? No. (Agent: If no, request that the patient contact the pharmacy for the refill.) (Agent: If yes, when and what did the pharmacy advise?)  Preferred Pharmacy (with phone number or street name):  [place], [place] - [place] [phone] (Phone) [phone] (Fax)     Agent: Please be advised that RX refills may take up to 3 business days. We ask that you follow-up with your pharmacy.

## 2019-04-11 ENCOUNTER — Encounter (HOSPITAL_COMMUNITY): Payer: Self-pay

## 2019-04-13 ENCOUNTER — Encounter (HOSPITAL_COMMUNITY): Payer: Self-pay

## 2019-04-16 ENCOUNTER — Encounter (HOSPITAL_COMMUNITY): Payer: Self-pay

## 2019-04-24 NOTE — Progress Notes (Signed)
Subjective:   Hannah Vasquez is a 68 y.o. female who presents for Medicare Annual (Subsequent) preventive examination.  Review of Systems: Cardiac Risk Factors include: advanced age (>80mn, >>40women);hypertension Home Safety/Smoke Alarms: Feels safe in home. Smoke alarms in place.  Lives alone in 1 story home.   Female:   Pap-hx hysterectomy       Mammo-09/25/18       Dexa scan-    05/05/18    CCS- 12/28/16.      Objective:     Vitals: BP 128/61 Comment: pt reported  Pulse 75   Wt 150 lb 14.4 oz (68.4 kg)   LMP  (LMP Unknown)   SpO2 98%   BMI 27.60 kg/m   Body mass index is 27.6 kg/m.  Advanced Directives 04/25/2019 04/19/2018 02/22/2017 01/02/2017 01/01/2017 09/20/2016 03/29/2016  Does Patient Have a Medical Advance Directive? Yes Yes Yes Yes No Yes Yes  Type of AParamedicof APort MatildaLiving will [place]Living will - [place]Living will - [place]ol[place] PrairieLiving will [place]illed[place]villeLiving will  Does patient want to make changes to medical advance directive? No - Patient declined - No - Patient declined No - Patient declined - - No - Patient declined  Copy of HRatamosain Chart? No - copy requested No - copy requested - No - copy requested - No - copy requested No - copy requested    Tobacco Social History   Tobacco Use  Smoking Status Never Smoker  Smokeless Tobacco Never Used     Counseling given: Not Answered   Clinical Intake: Pain : No/denies pain   Past Medical History:  Diagnosis Date  . Allergy   . Anxiety   . CAD (coronary artery disease)   . Cataract   . Hemorrhoids   . History of MI (myocardial infarction) 10/07/2017  . Hypertension   . Mitral valve prolapse   . Osteoporosis   . Status post dilation of esophageal narrowing    Past Surgical History:  Procedure Laterality Date  . BASAL CELL CARCINOMA EXCISION  02/05/2016   [facility]  . BLADDER SURGERY  11/15/1995   bladder neck suspension, urinary incontinence  . CARDIAC CATHETERIZATION  01/03/2017 DES LAD  . CATARACT EXTRACTION Left 04/30/2016   [place] 01/[place]   Procedure: Coronary Stent Intervention;  Surgeon: ENelva Bush MD;  [facility];  Service: Cardiovascular;  Laterality: N/A;  . DENTAL SURGERY  06/20/2016   Tooth Implant; Dr. BKalman Shan . INTRAVASCULAR ULTRASOUND/IVUS N/A 01/03/2017   Procedure: Intravascular Ultrasound/IVUS;  Surgeon: ENelva Bush MD;  [facility];  Service: Cardiovascular;  Laterality: N/A;  . KNEE SURGERY  2003   torn meniscus  . LEFT HEART CATH AND CORONARY ANGIOGRAPHY N/A 01/03/2017   Procedure: Left Heart Cath and Coronary Angiography;  Surgeon: ENelva Bush MD;  [facility];  Service: Cardiovascular;  Laterality: N/A;  . MOHS SURGERY Right 2017   right side of face  . ROTATOR CUFF REPAIR    . SHOULDER SURGERY Right 2005   Family History  Problem Relation Age of Onset  . Mitral valve prolapse Mother   . Hypertension Mother   . Hypertension Father   . Brain cancer Father    Social History   Socioeconomic History  . Marital status: Divorced    Spouse name: Not on  file  . Number of children: 2  . Years of education: Not on file  . Highest education level: Not on file  Occupational History  . 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 Financial resource strain: Not on file  . Food insecurity    Worry: Not on file    Inability: Not on file  . Transportation needs    Medical: Not on file    Non-medical: Not on file  Tobacco Use  . Smoking status: Never Smoker  . Smokeless tobacco: Never Used  Substance and Sexual Activity  . Alcohol use: No    Alcohol/week: 0.0 standard drinks  . Drug use: No  . Sexual activity: Not Currently  Lifestyle  .  Physical activity    Days per week: Not on file    Minutes per session: Not on file  . Stress: Not on file  Relationships  . Social Herbalist on phone: Not on file    Gets together: Not on file    Attends religious service: Not on file    Active member of club or organization: Not on file    Attends meetings of clubs or organizations: Not on file    Relationship status: Not on file  Other Topics Concern  . Not on file  Social History Narrative   Has a living will.   HPOA- daughter.   She would desire CPR.  Would desire life support if necessary and not futile.          Outpatient Encounter Medications as of 04/25/2019  Medication Sig  . acetaminophen (TYLENOL) 500 MG tablet Take 1 tablet (500 mg total) by mouth 2 (two) times daily.  Marland Kitchen amLODipine (NORVASC) 5 MG tablet Take 1 tablet (5 mg total) by mouth daily.  Marland Kitchen aspirin EC 81 MG tablet Take 81 mg by mouth every morning.   Marland Kitchen atorvastatin (LIPITOR) 80 MG tablet TAKE 1 TABLET BY MOUTH ONCE A DAY AT 6PM  . Calcium-Magnesium-Vitamin D (CALCIUM 1200+D3 PO) Take 1,200 mg by mouth.  . Cholecalciferol (VITAMIN D3) 1000 UNITS CAPS Take 1 capsule by mouth every morning.   . diclofenac sodium (VOLTAREN) 1 % GEL Apply 4 g topically 4 (four) times daily. To affected joint.  . fluticasone (FLONASE) 50 MCG/ACT nasal spray Place 1 spray into both nostrils as needed.   . furosemide (LASIX) 20 MG tablet Take 1 tablet (20 mg total) by mouth daily as needed.  . gabapentin (NEURONTIN) 300 MG capsule Take 1 capsule (300 mg total) by mouth 2 (two) times daily.  . isosorbide mononitrate (IMDUR) 30 MG 24 hr tablet Take 1 tablet (30 mg total) by mouth daily.  Marland Kitchen loratadine (CLARITIN) 10 MG tablet Take 1 tablet (10 mg total) by mouth every morning.  Marland Kitchen losartan (COZAAR) 100 MG tablet Take 1 tablet (100 mg total) by mouth daily.  . metoprolol succinate (TOPROL-XL) 25 MG 24 hr tablet Take 1 tablet (25 mg total) by mouth daily.  . Multiple Vitamin  (MULTIVITAMIN) tablet Take 1 tablet by mouth every morning.   . Omega-3 Fatty Acids (FISH OIL) 1200 MG CAPS Take 1 capsule by mouth every morning.   Marland Kitchen omeprazole (PRILOSEC) 40 MG capsule Take 40 mg by mouth daily.   . potassium chloride SA (K-DUR) 20 MEQ tablet Take 2 tablets (40 mEq total) by mouth 2 (two) times daily.  . traMADol (ULTRAM) 50 MG tablet TAKE 1 TABLET BY MOUTH 4 TIMES  DAILY AS NEEDED  . vitamin B-12 (CYANOCOBALAMIN) 100 MCG tablet Take 1 tablet (100 mcg total) by mouth daily.  Marland Kitchen albuterol (PROVENTIL HFA;VENTOLIN HFA) 108 (90 Base) MCG/ACT inhaler Inhale 1 puff into the lungs every 6 (six) hours as needed for wheezing or shortness of breath. (Patient not taking: Reported on 04/25/2019)  . hydrochlorothiazide (HYDRODIURIL) 25 MG tablet Take 1 tablet (25 mg total) by mouth daily.  . nitroGLYCERIN (NITROSTAT) 0.4 MG SL tablet Place 1 tablet (0.4 mg total) under the tongue every 5 (five) minutes as needed for chest pain. (Patient not taking: Reported on 04/25/2019)  . oxyCODONE (OXY IR/ROXICODONE) 5 MG immediate release tablet Take 1 tablet (5 mg total) by mouth every 4 (four) hours as needed for severe pain. (Patient not taking: Reported on 04/25/2019)  . tiZANidine (ZANAFLEX) 4 MG tablet Take 0.5 mg by mouth once. Take 1/5-1tid prn   No facility-administered encounter medications on file as of 04/25/2019.     Activities of Daily Living In your present state of health, do you have any difficulty performing the following activities: 04/25/2019  Hearing? Y  Comment has appt 05/23/19 at [facility]  Vision? N  Difficulty concentrating or making decisions? N  Walking or climbing stairs? N  Dressing or bathing? N  Doing errands, shopping? N  Preparing Food and eating ? N  Using the Toilet? N  In the past six months, have you accidently leaked urine? N  Do you have problems with loss of bowel control? N  Managing your Medications? N  Managing your Finances? N  Housekeeping or managing your  Housekeeping? N  Some recent data might be hidden    Patient Care Team: Lucille Passy, MD as PCP - General (Family Medicine) Jerline Pain, MD as PCP - Cardiology (Cardiology) Jerline Pain, MD as Consulting Physician (Cardiology) Darleen Crocker, MD as Consulting Physician (Ophthalmology) Thelma Comp, [place] as Consulting Physician (Optometry) Griselda Miner, MD as Consulting Physician (Dermatology) Renee Pain, DDS as Consulting Physician (Dentistry)    Assessment:   This is a routine wellness examination for [place]. Physical assessment deferred to PCP.  Exercise Activities and Dietary recommendations Current Exercise Habits: Home exercise routine, Type of exercise: stretching, Time (Minutes): 10, Frequency (Times/Week): 7, Weekly Exercise (Minutes/Week): 70, Intensity: Mild, Exercise limited by: None identified   Diet (meal preparation, eat out, water intake, caffeinated beverages, dairy products, fruits and vegetables): in general, a "healthy" diet  , well balanced     Goals    . Increase physical activity     When weather permits, I will resume walking 30-60 min 2-3 days per week.        Fall Risk Fall Risk  04/25/2019 04/19/2018 01/10/2018 02/22/2017 09/20/2016  Falls in the past year? 0 No No Yes No  Number falls in past yr: - - - 2 or more -  Injury with Fall? - - - Yes -  Comment - - - L sided elbow and shoulder, hit back of (L-sided) head and R arm area -    Depression Screen PHQ 2/9 Scores 04/25/2019 04/19/2018 01/10/2018 06/01/2017  PHQ - 2 Score 0 0 0 0     Cognitive Function Ad8 score reviewed for issues:  Issues making decisions:no  Less interest in hobbies / activities:no  Repeats questions, stories (family complaining):no  Trouble using ordinary gadgets (microwave, computer, phone):no  Forgets the month or year: no  Mismanaging finances: no  Remembering appts:no  Daily problems with thinking and/or memory:no  Ad8 score is=0     MMSE - Mini  Mental State Exam 04/19/2018 09/20/2016  Orientation to time 5 5  Orientation to Place 5 5  Registration 3 3  Attention/ Calculation 5 0  Recall 3 3  Language- name 2 objects 2 0  Language- repeat 1 1  Language- follow 3 step command 3 3  Language- read & follow direction 1 0  Write a sentence 1 0  Copy design 1 0  Total score 30 20        Immunization History  Administered Date(s) Administered  . Fluad Quad(high Dose 65+) 04/09/2019  . Influenza Split 05/27/2011  . Influenza Whole 06/04/2009  . Influenza,inj,Quad PF,6+ Mos 06/07/2013, 06/12/2014, 05/09/2015, 06/11/2016, 06/03/2017, 04/27/2018  . Pneumococcal Conjugate-13 11/27/2014  . Pneumococcal Polysaccharide-23 11/08/2013  . Td 08/16/1993  . Tdap 09/09/2011  . Zoster 11/19/2009   Screening Tests Health Maintenance  Topic Date Due  . MAMMOGRAM  09/26/2019  . DEXA SCAN  05/05/2020  . TETANUS/TDAP  09/08/2021  . COLONOSCOPY  12/29/2026  . INFLUENZA VACCINE  Completed  . Hepatitis C Screening  Completed  . PNA vac Low Risk Adult  Completed      Plan:   See you next year!  Continue to eat heart healthy diet (full of fruits, vegetables, whole grains, lean protein, water--limit salt, fat, and sugar intake) and increase physical activity as tolerated.  Continue doing brain stimulating activities (puzzles, reading, adult coloring books, staying active) to keep memory sharp.   Bring a copy of your living will and/or healthcare power of attorney to your next office visit.   I have personally reviewed and noted the following in the patient's chart:   . Medical and social history . Use of alcohol, tobacco or illicit drugs  . Current medications and supplements . Functional ability and status . Nutritional status . Physical activity . Advanced directives . List of other physicians . Hospitalizations, surgeries, and ER visits in previous 12 months . Vitals . Screenings to include cognitive, depression, and falls .  Referrals and appointments  In addition, I have reviewed and discussed with patient certain preventive protocols, quality metrics, and best practice recommendations. A written personalized care plan for preventive services as well as general preventive health recommendations were provided to patient.     Shela Nevin, [place]  04/25/2019

## 2019-04-25 ENCOUNTER — Telehealth: Payer: Self-pay

## 2019-04-25 ENCOUNTER — Ambulatory Visit (INDEPENDENT_AMBULATORY_CARE_PROVIDER_SITE_OTHER): Payer: PPO | Admitting: *Deleted

## 2019-04-25 ENCOUNTER — Other Ambulatory Visit: Payer: Self-pay

## 2019-04-25 ENCOUNTER — Encounter: Payer: Self-pay | Admitting: *Deleted

## 2019-04-25 VITALS — BP 128/61 | HR 75 | Wt 150.9 lb

## 2019-04-25 DIAGNOSIS — Z0000 Encounter for general adult medical examination without abnormal findings: Secondary | ICD-10-CM | POA: Diagnosis not present

## 2019-04-25 NOTE — Telephone Encounter (Signed)
It appears pt was already seen today.

## 2019-04-25 NOTE — Telephone Encounter (Signed)
Copied from [place] [id number]. Topic: General - Other >> Apr 24, 2019  5:20 PM Mcneil, Ja-Kwan wrote: Reason for CRM: Pt stated she prefers to do the appt over the phone. Pt requests call back. Cb# [phone]

## 2019-04-25 NOTE — Patient Instructions (Signed)
See you next year!  Continue to eat heart healthy diet (full of fruits, vegetables, whole grains, lean protein, water--limit salt, fat, and sugar intake) and increase physical activity as tolerated.  Continue doing brain stimulating activities (puzzles, reading, adult coloring books, staying active) to keep memory sharp.   Bring a copy of your living will and/or healthcare power of attorney to your next office visit.   Hannah Vasquez , Thank you for taking time to come for your Medicare Wellness Visit. I appreciate your ongoing commitment to your health goals. Please review the following plan we discussed and let me know if I can assist you in the future.   These are the goals we discussed: Goals    . Increase physical activity     When weather permits, I will resume walking 30-60 min 2-3 days per week.        This is a list of the screening recommended for you and due dates:  Health Maintenance  Topic Date Due  . Mammogram  09/26/2019  . DEXA scan (bone density measurement)  05/05/2020  . Tetanus Vaccine  09/08/2021  . Colon Cancer Screening  12/29/2026  . Flu Shot  Completed  .  Hepatitis C: One time screening is recommended by Center for Disease Control  (CDC) for  adults born from [birth year] through 1967.   Completed  . Pneumonia vaccines  Completed    Health Maintenance After Age 56 After age 40, you are at a higher risk for certain long-term diseases and infections as well as injuries from falls. Falls are a major cause of broken bones and head injuries in people who are older than age 70. Getting regular preventive care can help to keep you healthy and well. Preventive care includes getting regular testing and making lifestyle changes as recommended by your health care provider. Talk with your health care provider about:  Which screenings and tests you should have. A screening is a test that checks for a disease when you have no symptoms.  A diet and exercise plan that is right for  you. What should I know about screenings and tests to prevent falls? Screening and testing are the best ways to find a health problem early. Early diagnosis and treatment give you the best chance of managing medical conditions that are common after age 70. Certain conditions and lifestyle choices may make you more likely to have a fall. Your health care provider may recommend:  Regular vision checks. Poor vision and conditions such as cataracts can make you more likely to have a fall. If you wear glasses, make sure to get your prescription updated if your vision changes.  Medicine review. Work with your health care provider to regularly review all of the medicines you are taking, including over-the-counter medicines. Ask your health care provider about any side effects that may make you more likely to have a fall. Tell your health care provider if any medicines that you take make you feel dizzy or sleepy.  Osteoporosis screening. Osteoporosis is a condition that causes the bones to get weaker. This can make the bones weak and cause them to break more easily.  Blood pressure screening. Blood pressure changes and medicines to control blood pressure can make you feel dizzy.  Strength and balance checks. Your health care provider may recommend certain tests to check your strength and balance while standing, walking, or changing positions.  Foot health exam. Foot pain and numbness, as well as not wearing proper footwear, can  make you more likely to have a fall.  Depression screening. You may be more likely to have a fall if you have a fear of falling, feel emotionally low, or feel unable to do activities that you used to do.  Alcohol use screening. Using too much alcohol can affect your balance and may make you more likely to have a fall. What actions can I take to lower my risk of falls? General instructions  Talk with your health care provider about your risks for falling. Tell your health care  provider if: ? You fall. Be sure to tell your health care provider about all falls, even ones that seem minor. ? You feel dizzy, sleepy, or off-balance.  Take over-the-counter and prescription medicines only as told by your health care provider. These include any supplements.  Eat a healthy diet and maintain a healthy weight. A healthy diet includes low-fat dairy products, low-fat (lean) meats, and fiber from whole grains, beans, and lots of fruits and vegetables. Home safety  Remove any tripping hazards, such as rugs, cords, and clutter.  Install safety equipment such as grab bars in bathrooms and safety rails on stairs.  Keep rooms and walkways well-lit. Activity   Follow a regular exercise program to stay fit. This will help you maintain your balance. Ask your health care provider what types of exercise are appropriate for you.  If you need a cane or walker, use it as recommended by your health care provider.  Wear supportive shoes that have nonskid soles. Lifestyle  Do not drink alcohol if your health care provider tells you not to drink.  If you drink alcohol, limit how much you have: ? 0-1 drink a day for women. ? 0-2 drinks a day for men.  Be aware of how much alcohol is in your drink. In the [place] one drink equals one typical bottle of beer (12 oz), one-half glass of wine (5 oz), or one shot of hard liquor (1 oz).  Do not use any products that contain nicotine or tobacco, such as cigarettes and e-cigarettes. If you need help quitting, ask your health care provider. Summary  Having a healthy lifestyle and getting preventive care can help to protect your health and wellness after age 70.  Screening and testing are the best way to find a health problem early and help you avoid having a fall. Early diagnosis and treatment give you the best chance for managing medical conditions that are more common for people who are older than age 70.  Falls are a major cause of broken  bones and head injuries in people who are older than age 70. Take precautions to prevent a fall at home.  Work with your health care provider to learn what changes you can make to improve your health and wellness and to prevent falls. This information is not intended to replace advice given to you by your health care provider. Make sure you discuss any questions you have with your health care provider. Document Released: 06/15/2017 Document Revised: 11/23/2018 Document Reviewed: 06/15/2017 Elsevier Patient Education  2020 [employer].

## 2019-04-30 ENCOUNTER — Encounter: Payer: Self-pay | Admitting: Family Medicine

## 2019-04-30 ENCOUNTER — Other Ambulatory Visit: Payer: Self-pay

## 2019-04-30 ENCOUNTER — Ambulatory Visit: Payer: PPO | Admitting: Family Medicine

## 2019-04-30 DIAGNOSIS — M65332 Trigger finger, left middle finger: Secondary | ICD-10-CM

## 2019-04-30 NOTE — Assessment & Plan Note (Signed)
Doing well since injection.  - counseled on splinting and buddy tape.  - counseled on supportive care  - can inject if returns or try surgery.

## 2019-04-30 NOTE — Progress Notes (Signed)
Hannah Vasquez - 68 y.o. female MRN [id number]  Date of birth: [birth year]  SUBJECTIVE:  Including CC & ROS.  Chief Complaint  Patient presents with  . Follow-up    follow up for left middle finger    Hannah Vasquez is a 68 y.o. female that is following up for her trigger finger.  She is not having any significant pain.  Does notice triggering very intermittently and infrequently.  Has been using the splint at night.  Denies any numbness or tingling.   Review of Systems  Constitutional: Negative for fever.  HENT: Negative for congestion.   Respiratory: Negative for cough.   Cardiovascular: Negative for chest pain.  Gastrointestinal: Negative for abdominal pain.  Musculoskeletal: Negative for gait problem.  Neurological: Negative for weakness.  Hematological: Negative for adenopathy.    HISTORY: Past Medical, Surgical, Social, and Family History Reviewed & Updated per EMR.   Pertinent Historical Findings include:  Past Medical History:  Diagnosis Date  . Allergy   . Anxiety   . CAD (coronary artery disease)   . Cataract   . Hemorrhoids   . History of MI (myocardial infarction) 10/07/2017  . Hypertension   . Mitral valve prolapse   . Osteoporosis   . Status post dilation of esophageal narrowing     Past Surgical History:  Procedure Laterality Date  . BASAL CELL CARCINOMA EXCISION  02/05/2016   Dr. Sarajane Jews, [facility]  . BLADDER SURGERY  11/15/1995   bladder neck suspension, urinary incontinence  . CARDIAC CATHETERIZATION  01/03/2017 DES LAD  . CATARACT EXTRACTION Left 04/30/2016   Dr. Tommy Rainwater, [place] N/A 01/03/2017   Procedure: Coronary Stent Intervention;  Surgeon: Nelva Bush, MD;  Location: [place] [facility];  Service: Cardiovascular;  Laterality: N/A;  . DENTAL SURGERY  06/20/2016   Tooth Implant; Dr. Kalman Shan  . INTRAVASCULAR ULTRASOUND/IVUS N/A 01/03/2017   Procedure: Intravascular Ultrasound/IVUS;  Surgeon: Nelva Bush, MD;  Location: [place] [facility];  Service: Cardiovascular;  Laterality: N/A;  . KNEE SURGERY  2003   torn meniscus  . LEFT HEART CATH AND CORONARY ANGIOGRAPHY N/A 01/03/2017   Procedure: Left Heart Cath and Coronary Angiography;  Surgeon: Nelva Bush, MD;  Location: [place] [facility];  Service: Cardiovascular;  Laterality: N/A;  . MOHS SURGERY Right 2017   right side of face  . ROTATOR CUFF REPAIR    . SHOULDER SURGERY Right 2005    Allergies  Allergen Reactions  . Bactroban [Mupirocin Calcium]     Facial redness and swelling  . Codeine     REACTION: nausea and vomiting  . Penicillins Nausea And Vomiting    REACTION: rash  . Valtrex [Valacyclovir Hcl] Swelling    Per pt causes redness    Family History  Problem Relation Age of Onset  . Mitral valve prolapse Mother   . Hypertension Mother   . Hypertension Father   . Brain cancer Father      Social History   Socioeconomic History  . Marital status: Divorced    Spouse name: Not on file  . Number of children: 2  . Years of education: Not on file  . Highest education level: Not on file  Occupational History  . 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 Financial resource strain: Not on file  . Food insecurity    Worry: Not on file    Inability: Not on file  .  Transportation needs    Medical: Not on file    Non-medical: Not on file  Tobacco Use  . Smoking status: Never Smoker  . Smokeless tobacco: Never Used  Substance and Sexual Activity  . Alcohol use: No    Alcohol/week: 0.0 standard drinks  . Drug use: No  . Sexual activity: Not Currently  Lifestyle  . Physical activity    Days per week: Not on file    Minutes per session: Not on file  . Stress: Not on file  Relationships  . Social Herbalist on phone: Not on file    Gets together: Not on file    Attends religious service: Not on file    Active member of club or organization: Not on file     Attends meetings of clubs or organizations: Not on file    Relationship status: Not on file  . Intimate partner violence    Fear of current or ex partner: Not on file    Emotionally abused: Not on file    Physically abused: Not on file    Forced sexual activity: Not on file  Other Topics Concern  . Not on file  Social History Narrative   Has a living will.   HPOA- daughter.   She would desire CPR.  Would desire life support if necessary and not futile.           PHYSICAL EXAM:  VS: BP 130/70   Ht 5' 2"  (1.575 m)   Wt 150 lb (68 kg)   LMP  (LMP Unknown)   BMI 27.44 kg/m  Physical Exam Gen: NAD, alert, cooperative with exam, well-appearing ENT: normal lips, normal nasal mucosa,  Eye: normal EOM, normal conjunctiva and lids CV:  no edema, +2 pedal pulses   Resp: no accessory muscle use, non-labored,   Skin: no rashes, no areas of induration  Neuro: normal tone, normal sensation to touch Psych:  normal insight, alert and oriented MSK:  Left hand:  No obvious triggering  Normal finger ROM  Normal grip strength  No signs of atrophy  NVI.       ASSESSMENT & PLAN:   Trigger finger, left middle finger Doing well since injection.  - counseled on splinting and buddy tape.  - counseled on supportive care  - can inject if returns or try surgery.

## 2019-05-01 NOTE — Progress Notes (Signed)
Virtual Visit via Video Note  I connected with patient on 04/25/19 at  9:00 AM EDT by audio enabled telemedicine application and verified that I am speaking with the correct person using two identifiers.   THIS ENCOUNTER IS A VIRTUAL VISIT DUE TO COVID-19 - PATIENT WAS NOT SEEN IN THE OFFICE. PATIENT HAS CONSENTED TO VIRTUAL VISIT / TELEMEDICINE VISIT   Location of patient: home  Location of provider: office  I discussed the limitations of evaluation and management by telemedicine and the availability of in person appointments. The patient expressed understanding and agreed to proceed.

## 2019-05-02 DIAGNOSIS — M79642 Pain in left hand: Secondary | ICD-10-CM | POA: Diagnosis not present

## 2019-05-02 DIAGNOSIS — M65332 Trigger finger, left middle finger: Secondary | ICD-10-CM | POA: Diagnosis not present

## 2019-05-02 DIAGNOSIS — M79641 Pain in right hand: Secondary | ICD-10-CM | POA: Diagnosis not present

## 2019-05-02 DIAGNOSIS — M65311 Trigger thumb, right thumb: Secondary | ICD-10-CM | POA: Diagnosis not present

## 2019-05-18 ENCOUNTER — Telehealth: Payer: Self-pay

## 2019-05-18 NOTE — Telephone Encounter (Signed)
Copied from [place] [id number]. Topic: General - Other >> May 18, 2019  2:36 PM Rutherford Nail, NT wrote: Reason for CRM: Patient calling and is requesting a call back from Dr Deborra Medina or her assistant regarding an appointment that is scheduled with a provider that Dr Deborra Medina referred her to. Please advise.

## 2019-05-22 NOTE — Telephone Encounter (Signed)
PEC-LMOVM for pt to call and give details as to how we can help her/thx dmf

## 2019-05-22 NOTE — Telephone Encounter (Signed)
FYI:  TA-Pt could not go to [facility] for her hearing due to it being out-of-network/she will be seen at [facility] next week as this is in network and she did make [facility] aware of this/thx dmf

## 2019-05-31 DIAGNOSIS — H9313 Tinnitus, bilateral: Secondary | ICD-10-CM | POA: Diagnosis not present

## 2019-05-31 DIAGNOSIS — H903 Sensorineural hearing loss, bilateral: Secondary | ICD-10-CM | POA: Diagnosis not present

## 2019-06-12 ENCOUNTER — Other Ambulatory Visit: Payer: Self-pay

## 2019-06-12 ENCOUNTER — Encounter (HOSPITAL_COMMUNITY)
Admission: RE | Admit: 2019-06-12 | Discharge: 2019-06-12 | Disposition: A | Discharge status: Home / Self Care | Payer: Self-pay | Source: Ambulatory Visit | Attending: Cardiology | Admitting: Cardiology

## 2019-06-12 DIAGNOSIS — I214 Non-ST elevation (NSTEMI) myocardial infarction: Secondary | ICD-10-CM | POA: Insufficient documentation

## 2019-06-12 NOTE — Progress Notes (Addendum)
Hannah Vasquez presented today for her first day of exercise in the cardiac rehab maintenance program.   Entrance Vitals Blood Pressure: 132/62  Exercising Vitals Blood Pressure: 128/62   Additional Comments: Oriented patient to the exercise routine. Pt tolerated exercise well.  Sol Passer, MS, ACSM CEP 06/12/2019 0930

## 2019-06-13 DIAGNOSIS — M65311 Trigger thumb, right thumb: Secondary | ICD-10-CM | POA: Diagnosis not present

## 2019-06-13 DIAGNOSIS — M65332 Trigger finger, left middle finger: Secondary | ICD-10-CM | POA: Diagnosis not present

## 2019-06-14 ENCOUNTER — Other Ambulatory Visit: Payer: Self-pay

## 2019-06-14 ENCOUNTER — Encounter (HOSPITAL_COMMUNITY)
Admission: RE | Admit: 2019-06-14 | Discharge: 2019-06-14 | Disposition: A | Discharge status: Home / Self Care | Payer: Self-pay | Source: Ambulatory Visit | Attending: Cardiology | Admitting: Cardiology

## 2019-06-19 ENCOUNTER — Encounter (HOSPITAL_COMMUNITY)
Admission: RE | Admit: 2019-06-19 | Discharge: 2019-06-19 | Disposition: A | Discharge status: Home / Self Care | Payer: Self-pay | Source: Ambulatory Visit | Attending: Cardiology | Admitting: Cardiology

## 2019-06-19 ENCOUNTER — Other Ambulatory Visit: Payer: Self-pay

## 2019-06-19 DIAGNOSIS — I214 Non-ST elevation (NSTEMI) myocardial infarction: Secondary | ICD-10-CM | POA: Insufficient documentation

## 2019-06-21 ENCOUNTER — Encounter (HOSPITAL_COMMUNITY)
Admission: RE | Admit: 2019-06-21 | Discharge: 2019-06-21 | Disposition: A | Discharge status: Home / Self Care | Payer: Self-pay | Source: Ambulatory Visit | Attending: Cardiology | Admitting: Cardiology

## 2019-06-21 ENCOUNTER — Other Ambulatory Visit: Payer: Self-pay

## 2019-06-26 ENCOUNTER — Encounter (HOSPITAL_COMMUNITY)
Admission: RE | Admit: 2019-06-26 | Discharge: 2019-06-26 | Disposition: A | Discharge status: Home / Self Care | Payer: Self-pay | Source: Ambulatory Visit | Attending: Cardiology | Admitting: Cardiology

## 2019-06-26 ENCOUNTER — Other Ambulatory Visit: Payer: Self-pay

## 2019-06-28 ENCOUNTER — Encounter (HOSPITAL_COMMUNITY): Payer: Self-pay

## 2019-06-28 NOTE — Progress Notes (Signed)
CARDIOLOGY OFFICE NOTE  Date:  07/02/2019    Hannah Vasquez Date of Birth: [birth year] Medical Record #[id number]  PCP:  Lucille Passy, MD  Cardiologist:  Marisa Cyphers    Chief Complaint  Patient presents with  . Follow-up    Seen for Dr. Marlou Porch    History of Present Illness: Hannah Vasquez is a 68 y.o. female who presents today for a 6 month check. Seen for Dr. Marlou Porch.   She has a known history of CAD with prior STEMI in May 2018 treated with 2 stents to the LAD and had EF 40 to 45% at that time.  EF is now normal.  Had residual small ramus disease and jailed diagonal. Has had issues ever since with atypical chest pain and fatigue - Brilinta changed to Plavix - was on Effient - not just aspirin. Beta blocker has been reduced. Chronic fatigue since noted.  I last saw her in December - lots of issues - has had ongoing fatigue - has had CPX showing decreased exercise tolerance. Last seen by Dr. Marlou Porch by telehealth visit in May - felt to be stable from cardiac standpoint. BP was up and HCTZ was added back. Fatigue continued to be a complaint.   The patient does not have symptoms concerning for COVID-19 infection (fever, chills, cough, or new shortness of breath).   Comes in today. Here alone. She feels like she is doing pretty well. She was able to return to cardiac rehab and is happy about that - it is only for 12 weeks and then to transition to [facility] - this may not be doable due to the location - she was going to see about [facility] to the [facility] her [facility]. Trying to not watch too much TV. No chest pain. Breathing is good. She notes some vague lightheadedness - not really positional - does not check her BP when feeling this - but will start. No recent labs - she is fasting today.   Past Medical History:  Diagnosis Date  . Allergy   . Anxiety   . CAD (coronary artery disease)   . Cataract   . Hemorrhoids   . History of MI (myocardial infarction) 10/07/2017  .  Hypertension   . Mitral valve prolapse   . Osteoporosis   . Status post dilation of esophageal narrowing     Past Surgical History:  Procedure Laterality Date  . BASAL CELL CARCINOMA EXCISION  02/05/2016   Dr. Sarajane Jews, [facility]  . BLADDER SURGERY  11/15/1995   bladder neck suspension, urinary incontinence  . CARDIAC CATHETERIZATION  01/03/2017 DES LAD  . CATARACT EXTRACTION Left 04/30/2016   Dr. Tommy Rainwater, [place] N/A 01/03/2017   Procedure: Coronary Stent Intervention;  Surgeon: Nelva Bush, MD;  Location: [facility];  Service: Cardiovascular;  Laterality: N/A;  . DENTAL SURGERY  06/20/2016   Tooth Implant; Dr. Kalman Shan  . INTRAVASCULAR ULTRASOUND/IVUS N/A 01/03/2017   Procedure: Intravascular Ultrasound/IVUS;  Surgeon: Nelva Bush, MD;  Location: [place] [facility];  Service: Cardiovascular;  Laterality: N/A;  . KNEE SURGERY  2003   torn meniscus  . LEFT HEART CATH AND CORONARY ANGIOGRAPHY N/A 01/03/2017   Procedure: Left Heart Cath and Coronary Angiography;  Surgeon: Nelva Bush, MD;  Location: [place] [facility];  Service: Cardiovascular;  Laterality: N/A;  . MOHS SURGERY Right 2017   right side of face  . ROTATOR CUFF REPAIR    .  SHOULDER SURGERY Right 2005     Medications: Current Meds  Medication Sig  . acetaminophen (TYLENOL) 500 MG tablet Take 1 tablet (500 mg total) by mouth 2 (two) times daily.  Marland Kitchen albuterol (PROVENTIL HFA;VENTOLIN HFA) 108 (90 Base) MCG/ACT inhaler Inhale 1 puff into the lungs every 6 (six) hours as needed for wheezing or shortness of breath.  Marland Kitchen amLODipine (NORVASC) 5 MG tablet Take 1 tablet (5 mg total) by mouth daily.  Marland Kitchen aspirin EC 81 MG tablet Take 81 mg by mouth every morning.   Marland Kitchen atorvastatin (LIPITOR) 80 MG tablet TAKE 1 TABLET BY MOUTH ONCE A DAY AT 6PM  . Calcium-Magnesium-Vitamin D (CALCIUM 1200+D3 PO) Take 1,200 mg by mouth.  . Cholecalciferol (VITAMIN D3) 1000 UNITS CAPS  Take 1 capsule by mouth every morning.   . diclofenac sodium (VOLTAREN) 1 % GEL Apply 4 g topically 4 (four) times daily. To affected joint.  . fluticasone (FLONASE) 50 MCG/ACT nasal spray Place 1 spray into both nostrils as needed.   . furosemide (LASIX) 20 MG tablet Take 1 tablet (20 mg total) by mouth daily as needed.  . gabapentin (NEURONTIN) 300 MG capsule Take 1 capsule (300 mg total) by mouth 2 (two) times daily.  . isosorbide mononitrate (IMDUR) 30 MG 24 hr tablet Take 1 tablet (30 mg total) by mouth daily.  Marland Kitchen loratadine (CLARITIN) 10 MG tablet Take 1 tablet (10 mg total) by mouth every morning.  Marland Kitchen losartan (COZAAR) 100 MG tablet Take 1 tablet (100 mg total) by mouth daily.  . metoprolol succinate (TOPROL-XL) 25 MG 24 hr tablet Take 1 tablet (25 mg total) by mouth daily.  . Multiple Vitamin (MULTIVITAMIN) tablet Take 1 tablet by mouth every morning.   . nitroGLYCERIN (NITROSTAT) 0.4 MG SL tablet Place 1 tablet (0.4 mg total) under the tongue every 5 (five) minutes as needed for chest pain.  . Omega-3 Fatty Acids (FISH OIL) 1200 MG CAPS Take 1 capsule by mouth every morning.   Marland Kitchen omeprazole (PRILOSEC) 40 MG capsule Take 40 mg by mouth daily.   Marland Kitchen oxyCODONE (OXY IR/ROXICODONE) 5 MG immediate release tablet Take 1 tablet (5 mg total) by mouth every 4 (four) hours as needed for severe pain.  . potassium chloride SA (K-DUR) 20 MEQ tablet Take 2 tablets (40 mEq total) by mouth 2 (two) times daily.  Marland Kitchen tiZANidine (ZANAFLEX) 4 MG tablet Take 2-4 mg by mouth as needed.   . traMADol (ULTRAM) 50 MG tablet TAKE 1 TABLET BY MOUTH 4 TIMES DAILY AS NEEDED  . vitamin B-12 (CYANOCOBALAMIN) 100 MCG tablet Take 1 tablet (100 mcg total) by mouth daily.  . [DISCONTINUED] nitroGLYCERIN (NITROSTAT) 0.4 MG SL tablet Place 1 tablet (0.4 mg total) under the tongue every 5 (five) minutes as needed for chest pain.     Allergies: Allergies  Allergen Reactions  . Bactroban [Mupirocin Calcium]     Facial redness and  swelling  . Codeine     REACTION: nausea and vomiting  . Penicillins Nausea And Vomiting    REACTION: rash  . Valtrex [Valacyclovir Hcl] Swelling    Per pt causes redness    Social History: The patient  reports that she has never smoked. She has never used smokeless tobacco. She reports that she does not drink alcohol or use drugs.   Family History: The patient's family history includes Brain cancer in her father; Hypertension in her father and mother; Mitral valve prolapse in her mother.   Review of Systems: Please  see the history of present illness.   All other systems are reviewed and negative.   Physical Exam: VS:  BP 122/72   Pulse 63   Ht 5' 2"  (1.575 m)   Wt 155 lb 12.8 oz (70.7 kg)   LMP  (LMP Unknown)   SpO2 96%   BMI 28.50 kg/m  .  BMI Body mass index is 28.5 kg/m.  Wt Readings from Last 3 Encounters:  07/02/19 155 lb 12.8 oz (70.7 kg)  04/30/19 150 lb (68 kg)  04/25/19 150 lb 14.4 oz (68.4 kg)    General: Alert and in no acute distress. Seems more upbeat today.   HEENT: Normal.  Neck: Supple, no JVD, carotid bruits, or masses noted.  Cardiac: Regular rate and rhythm. No murmurs, rubs, or gallops. No edema.  Respiratory:  Lungs are clear to auscultation bilaterally with normal work of breathing.  GI: Soft and nontender.  MS: No deformity or atrophy. Gait and ROM intact.  Skin: Warm and dry. Color is normal.  Neuro:  Strength and sensation are intact and no gross focal deficits noted.  Psych: Alert, appropriate and with normal affect.   LABORATORY DATA:  EKG:  EKG is ordered today. This shows NSR with RBBB - HR is 66.   Lab Results  Component Value Date   WBC 8.6 07/26/2018   HGB 12.9 07/26/2018   HCT 37.8 07/26/2018   PLT 297.0 07/26/2018   GLUCOSE 127 (H) 12/22/2018   CHOL 107 07/31/2018   TRIG 102 07/31/2018   HDL 33 (L) 07/31/2018   LDLDIRECT 151.0 09/09/2011   LDLCALC 54 07/31/2018   ALT 19 12/22/2018   AST 22 12/22/2018   NA 141  12/22/2018   K 4.3 12/22/2018   CL 101 12/22/2018   CREATININE 0.85 12/22/2018   BUN 10 12/22/2018   CO2 24 12/22/2018   TSH 2.00 07/26/2018   INR 1.12 01/02/2017   HGBA1C 6.1 (H) 01/02/2017     BNP (last 3 results) No results for input(s): BNP in the last 8760 hours.  ProBNP (last 3 results) Recent Labs    07/26/18 1053  PROBNP 21.0     Other Studies Reviewed Today:  CPX 12/2017 Conclusion: The interpretation of this test is limited due to submaximal effort during the exercise. Based on available data, exercise testing with gas exchange does not indicate cardiopulmonary limitation. There was an early and significant hypertensive response to exercise, with elevated VE/VCO2 slope.    Test, report and preliminary impression by: Landis Martins, MS, ACSM-RCEP 12/15/2017 3:42 PM  Exercise testing stopped early due to severe HTN response to exercise and patient's imbalance. Available data shows a normal pVO2 when compared to matched subjects. The VE/VCO2 slope (a surrogate for dead space ventilation and increased pulmonary pressures) was elevated and likely represents diastolic dysfunction in the setting of a severe HTN response to exercise. No other cardiopulmonary abnormality was noted.   Glori Bickers, MD  3:07 PM   Cath 01/03/17 Conclusions: 1. Significant 2-vessel coronary artery disease, including diffuse LAD disease with up to 99% stenosis in the mid portion and TIMI-2 flow, as well as 90% small ramus lesion and 40% proximal/mid LCx disease. 2. Moderately reduced LV contraction (LVEF 35-45%) with anterior and apical akinesis. 3. Moderately elevated left ventricular filling pressure. 4. Successful IVUS-guided PCI to the mid and distal LAD with placement of 2 overlapping Resolute Onyx drug-eluting stents with 0% residual stenosis and TIMI-3 flow.  Recommendations: 1. Dual antiplatelet therapy with aspirin and  ticagrelor for at least 12 months, ideally longer. 2.  Aggressive secondary prevention. 3. Medical therapy for small ramus intermedius and jailed diagonal branch, as PCI would be challenging given small vessel size. 4. Diuresis and titration of evidence-based heart failure therapy, given moderately reduced LV contraction with elevated filling pressure.   ECHO 02/2017 - EF 65%   Echocardiogram 01/03/17: -EF 45%  ASSESSMENT AND PLAN:  1. CAD with priorNon-ST elevation myocardial infarction 01/03/17 - LAD stents  2, residual ramus disease, jailed diagonal. Has completed her DAPT. Remains just on Aspirin. She has no active symptoms - back at cardiac rehab. No changes made today.   2. HLD - on statin - checking labs today.   3. Chronic fatigue - seems better and not really endorsed today.   4. HTN - BP is fine - she will continue to monitor.   5. Lightheadedness - vague - no syncope - could be from medicines, low BP, etc. She will monitor.   6. COVID-19 Education: The signs and symptoms of COVID-19 were discussed with the patient and how to seek care for testing (follow up with PCP or arrange E-visit).  The importance of social distancing, staying at home, hand hygiene and wearing a mask when out in public were discussed today.  Current medicines are reviewed with the patient today.  The patient does not have concerns regarding medicines other than what has been noted above.  The following changes have been made:  See above.  Labs/ tests ordered today include:    Orders Placed This Encounter  Procedures  . Basic metabolic panel  . CBC no Diff  . Hepatic function panel  . Lipid Profile  . EKG 12-Lead     Disposition:   FU with Dr. Marlou Porch in 6 months.    Patient is agreeable to this plan and will call if any problems develop in the interim.   SignedTruitt Merle, NP  07/02/2019 10:55 AM  [facility] [address] [place], [place]  [address] Phone: [phone] Fax:  [phone]

## 2019-07-02 ENCOUNTER — Other Ambulatory Visit: Payer: Self-pay

## 2019-07-02 ENCOUNTER — Encounter: Payer: Self-pay | Admitting: Nurse Practitioner

## 2019-07-02 ENCOUNTER — Ambulatory Visit: Payer: PPO | Admitting: Nurse Practitioner

## 2019-07-02 VITALS — BP 122/72 | HR 63 | Ht 62.0 in | Wt 155.8 lb

## 2019-07-02 DIAGNOSIS — I10 Essential (primary) hypertension: Secondary | ICD-10-CM

## 2019-07-02 DIAGNOSIS — I2510 Atherosclerotic heart disease of native coronary artery without angina pectoris: Secondary | ICD-10-CM | POA: Diagnosis not present

## 2019-07-02 DIAGNOSIS — R5383 Other fatigue: Secondary | ICD-10-CM | POA: Diagnosis not present

## 2019-07-02 DIAGNOSIS — E7800 Pure hypercholesterolemia, unspecified: Secondary | ICD-10-CM

## 2019-07-02 DIAGNOSIS — Z7189 Other specified counseling: Secondary | ICD-10-CM | POA: Diagnosis not present

## 2019-07-02 LAB — HEPATIC FUNCTION PANEL
ALT: 17 IU/L (ref 0–32)
AST: 20 IU/L (ref 0–40)
Albumin: 4.2 g/dL (ref 3.8–4.8)
Alkaline Phosphatase: 172 IU/L — ABNORMAL HIGH (ref 39–117)
Bilirubin Total: 0.8 mg/dL (ref 0.0–1.2)
Bilirubin, Direct: 0.21 mg/dL (ref 0.00–0.40)
Total Protein: 6.7 g/dL (ref 6.0–8.5)

## 2019-07-02 LAB — BASIC METABOLIC PANEL
BUN/Creatinine Ratio: 12 (ref 12–28)
BUN: 11 mg/dL (ref 8–27)
CO2: 24 mmol/L (ref 20–29)
Calcium: 9.6 mg/dL (ref 8.7–10.3)
Chloride: 101 mmol/L (ref 96–106)
Creatinine, Ser: 0.95 mg/dL (ref 0.57–1.00)
GFR calc Af Amer: 70 mL/min/{1.73_m2} (ref >59)
GFR calc non Af Amer: 61 mL/min/{1.73_m2} (ref >59)
Glucose: 92 mg/dL (ref 65–99)
Potassium: 3.9 mmol/L (ref 3.5–5.2)
Sodium: 142 mmol/L (ref 134–144)

## 2019-07-02 LAB — CBC
Hematocrit: 37.2 % (ref 34.0–46.6)
Hemoglobin: 12.5 g/dL (ref 11.1–15.9)
MCH: 26.5 pg — ABNORMAL LOW (ref 26.6–33.0)
MCHC: 33.6 g/dL (ref 31.5–35.7)
MCV: 79 fL (ref 79–97)
Platelets: 366 10*3/uL (ref 150–450)
RBC: 4.71 x10E6/uL (ref 3.77–5.28)
RDW: 13.9 % (ref 11.7–15.4)
WBC: 11.7 10*3/uL — ABNORMAL HIGH (ref 3.4–10.8)

## 2019-07-02 LAB — LIPID PANEL
Chol/HDL Ratio: 3.7 ratio (ref 0.0–4.4)
Cholesterol, Total: 116 mg/dL (ref 100–199)
HDL: 31 mg/dL — ABNORMAL LOW (ref >39)
LDL Chol Calc (NIH): 63 mg/dL (ref 0–99)
Triglycerides: 123 mg/dL (ref 0–149)
VLDL Cholesterol Cal: 22 mg/dL (ref 5–40)

## 2019-07-02 MED ORDER — NITROGLYCERIN 0.4 MG SL SUBL: 0.4000 mg | SUBLINGUAL_TABLET | SUBLINGUAL | 3 refills | Status: DC | PRN

## 2019-07-02 NOTE — Patient Instructions (Addendum)
After Visit Summary:  We will be checking the following labs today - BMET, CBC, HPF and Lipids   Medication Instructions:    Continue with your current medicines.    If you need a refill on your cardiac medications before your next appointment, please call your pharmacy.     Testing/Procedures To Be Arranged:  N/A  Follow-Up:   See Dr. Marlou Porch in 6 months. You will receive a reminder letter in the mail two months in advance. If you don't receive a letter, please call our office to schedule the follow-up appointment.   At [facility], you and your health needs are our priority.  As part of our continuing mission to provide you with exceptional heart care, we have created designated Provider Care Teams.  These Care Teams include your primary Cardiologist (physician) and Advanced Practice Providers (APPs -  Physician Assistants and Nurse Practitioners) who all work together to provide you with the care you need, when you need it.  Special Instructions:  . Stay safe, stay home, wash your hands for at least 20 seconds and wear a mask when out in public.  . It was good to talk with you today.  . Talk to cardiac rehab about [facility] to the [facility] that is closer to your house.    Call the [place] office at [phone] if you have any questions, problems or concerns.

## 2019-07-03 ENCOUNTER — Encounter (HOSPITAL_COMMUNITY)
Admission: RE | Admit: 2019-07-03 | Discharge: 2019-07-03 | Disposition: A | Discharge status: Home / Self Care | Payer: Self-pay | Source: Ambulatory Visit | Attending: Cardiology | Admitting: Cardiology

## 2019-07-03 ENCOUNTER — Other Ambulatory Visit: Payer: Self-pay | Admitting: Family Medicine

## 2019-07-04 ENCOUNTER — Telehealth: Payer: Self-pay | Admitting: Nurse Practitioner

## 2019-07-04 ENCOUNTER — Telehealth: Payer: Self-pay

## 2019-07-04 NOTE — Telephone Encounter (Signed)
I spoke with pt and she is scheduled for a virtual visit.  She wanted to discuss her labs sooner then 07/17/19.

## 2019-07-04 NOTE — Telephone Encounter (Signed)
Follloow Up:     Pt said Hannah Vasquez called her yesterday with her lab results. Her question is , will they still show up on  My-Chart?

## 2019-07-04 NOTE — Telephone Encounter (Signed)
Copied from [place] [id number]. Topic: General - Other >> Jul 04, 2019  8:16 AM Alanda Slim E wrote: Reason for CRM: Pt would Like Dr. Hulen Shouts nurse to call her back anytime after noon about her lab results/ please advise

## 2019-07-05 ENCOUNTER — Encounter (HOSPITAL_COMMUNITY)
Admission: RE | Admit: 2019-07-05 | Discharge: 2019-07-05 | Disposition: A | Discharge status: Home / Self Care | Payer: Self-pay | Source: Ambulatory Visit | Attending: Cardiology | Admitting: Cardiology

## 2019-07-05 ENCOUNTER — Other Ambulatory Visit: Payer: Self-pay

## 2019-07-05 ENCOUNTER — Other Ambulatory Visit: Payer: Self-pay | Admitting: Family Medicine

## 2019-07-05 MED ORDER — TRAMADOL HCL 50 MG PO TABS: ORAL_TABLET | ORAL | 0 refills | Status: DC

## 2019-07-05 MED ORDER — GABAPENTIN 300 MG PO CAPS: 300.0000 mg | ORAL_CAPSULE | Freq: Two times a day (BID) | ORAL | 3 refills | Status: DC

## 2019-07-05 NOTE — Telephone Encounter (Signed)
Copied from [place] [phone]. Topic: Quick Communication - Rx Refill/Question >> Jul 05, 2019 11:58 AM Leward Quan A wrote: Medication: gabapentin (NEURONTIN) 300 MG capsule, traMADol (ULTRAM) 50 MG tablet  Has the patient contacted their pharmacy? Yes.   (Agent: If no, request that the patient contact the pharmacy for the refill.) (Agent: If yes, when and what did the pharmacy advise?)  Preferred Pharmacy (with phone number or street name): [facility], [place] [facility] [place] [phone] (Phone) [phone] (Fax)    Agent: Please be advised that RX refills may take up to 3 business days. We ask that you follow-up with your pharmacy.

## 2019-07-05 NOTE — Telephone Encounter (Signed)
Sent Hannah Vasquez a message and will mail pt's recent labs.

## 2019-07-05 NOTE — Telephone Encounter (Signed)
Requested medication (s) are due for refill today:yes  Requested medication (s) are on the active medication list: yes  Last refill:  04/10/2019  Future visit scheduled: yes  Notes to clinic:  Refill cannot be delegated    Requested Prescriptions  Pending Prescriptions Disp Refills   traMADol (ULTRAM) 50 MG tablet 90 tablet 0    Sig: TAKE 1 TABLET BY MOUTH 4 TIMES DAILY AS NEEDED     Not Delegated - Analgesics:  Opioid Agonists Failed - 07/05/2019 12:31 PM      Failed - This refill cannot be delegated      Failed - Urine Drug Screen completed in last 360 days.      Passed - Valid encounter within last 6 months    Recent Outpatient Visits          2 months ago Trigger finger, left middle finger   [facility], Marciano Sequin, MD   6 months ago Trigger finger, left middle finger   [facility] Rosemarie Ax, MD   8 months ago Trigger finger, left middle finger   [facility] Rosemarie Ax, MD   10 months ago E. coli UTI (urinary tract infection)   [facility] Senters, [place] M, MD   10 months ago Acute cystitis without hematuria   [facility] Senters, Marciano Sequin, MD      Future Appointments            In 6 days Lucille Passy, MD [facility], [facility]           Signed Prescriptions Disp Refills   gabapentin (NEURONTIN) 300 MG capsule 60 capsule 3    Sig: Take 1 capsule (300 mg total) by mouth 2 (two) times daily.     Neurology: Anticonvulsants - gabapentin Passed - 07/05/2019 12:31 PM      Passed - Valid encounter within last 12 months    Recent Outpatient Visits          2 months ago Trigger finger, left middle finger   [facility] Senters, Marciano Sequin, MD   6 months ago Trigger finger, left middle finger   [facility] Rosemarie Ax, MD   8 months ago Trigger finger, left middle finger   [facility] Rosemarie Ax, MD   10 months ago E. coli UTI (urinary tract infection)   [facility] Senters, [place] M, MD   10 months ago Acute cystitis without hematuria   [facility] Senters, Marciano Sequin, MD      Future Appointments            In 6 days Lucille Passy, MD [facility] [place], [facility]

## 2019-07-05 NOTE — Telephone Encounter (Signed)
lvm will have to mail pt's lab results due to mychart release button was down.  IT was contacted yesterday and this if fixed but already resulted.  Will mail pt copy of labs. Will send Gilmer Mor a message to put in mail today.

## 2019-07-10 ENCOUNTER — Other Ambulatory Visit: Payer: Self-pay

## 2019-07-10 ENCOUNTER — Encounter (HOSPITAL_COMMUNITY): Payer: Self-pay

## 2019-07-10 ENCOUNTER — Encounter (HOSPITAL_COMMUNITY)
Admission: RE | Admit: 2019-07-10 | Discharge: 2019-07-10 | Disposition: A | Discharge status: Home / Self Care | Payer: Self-pay | Source: Ambulatory Visit | Attending: Cardiology | Admitting: Cardiology

## 2019-07-10 DIAGNOSIS — D72829 Elevated white blood cell count, unspecified: Secondary | ICD-10-CM | POA: Insufficient documentation

## 2019-07-10 DIAGNOSIS — R748 Abnormal levels of other serum enzymes: Secondary | ICD-10-CM | POA: Insufficient documentation

## 2019-07-10 NOTE — Assessment & Plan Note (Addendum)
CAD- followed by Dr. Marlou Porch. H/o prior ST elevation myocardial infarction on 01/01/17 stating that she had chest pressure that waxed and waned then returned, troponin was 0.16. She received 2 stents to the LAD, at that time EF was 40- 45%, now normal.   Sees Dr. Marlou Porch. Attended cardiac rehab yesterday.  Last saw Truitt Merle, cardiology NP on 07/02/19 for 6 month follow up.  Note reviewed.   Has had issues ever since with atypical chest pain and fatigue - Brilinta changed to Plavix - was on Effient - not just aspirin. Beta blocker has been reduced. Chronic fatigue still noted.  I do not see plavix on med list- I see ASA 81 mg daily. On statin and betablocker.

## 2019-07-10 NOTE — Assessment & Plan Note (Addendum)
Due to recent E coli UTI will add UA, urine mciro and urine cx to labs as well to rule out other causes of her leukocytosis. eRx aslo sent in for Cipro (PCN allergic) for her to take after she leaves urine sample.

## 2019-07-10 NOTE — Progress Notes (Signed)
Virtual Visit via Video   Due to the COVID-19 pandemic, this visit was completed with telemedicine (audio/video) technology to reduce patient and provider exposure as well as to preserve personal protective equipment.   I connected with [name] by a video enabled telemedicine application and verified that I am speaking with the correct person using two identifiers. Location patient: Home Location provider: [facility], Office Persons participating in the virtual visit: [name], MD   I discussed the limitations of evaluation and management by telemedicine and the availability of in person appointments. The patient expressed understanding and agreed to proceed.  Care Team   Patient Care Team: Lucille Passy, MD as PCP - General (Family Medicine) Jerline Pain, MD as PCP - Cardiology (Cardiology) Jerline Pain, MD as Consulting Physician (Cardiology) Darleen Crocker, MD as Consulting Physician (Ophthalmology) Thelma Comp, [place] as Consulting Physician (Optometry) Griselda Miner, MD as Consulting Physician (Dermatology) Renee Pain, DDS as Consulting Physician (Dentistry)  Subjective:   HPI:    CAD- followed by Dr. Marlou Porch. H/o prior ST elevation myocardial infarction on 01/01/17 stating that she had chest pressure that waxed and waned then returned, troponin was 0.16. She received 2 stents to the LAD, at that time EF was 40- 45%, now normal.   Sees Dr. Marlou Porch. Attended cardiac rehab yesterday.  Last saw Truitt Merle, cardiology NP on 07/02/19 for 6 month follow up.  Note reviewed.   Has had issues ever since with atypical chest pain and fatigue - Brilinta changed to Plavix - was on Effient - not just aspirin. Beta blocker has been reduced. Chronic fatigue since noted.  HTN- she is now taking HCTZ 25 mg daily, norvasc 5 mg daily, Imdur 30 mg daily, Cozaar 100 mg daily, Toprol XL 25 mg daily along with as needed lasix and as needed NTG.  BP Readings  from Last 3 Encounters:  07/11/19 (!) 150/79  07/02/19 122/72  04/30/19 130/70   Lab Results  Component Value Date   CREATININE 0.95 07/02/2019    At that office visit, cardiology checked labs and referred her back to be for elevated Alk phos and leukocytosis.     Elevated Alk phos- increased to 172 from 148  Six months ago.  I was however 144 9 months ago and 166 a year ago.  In fact, looking back several years, her Alk phos began to increase shortly after her MI- it was 136 in 02/2017.   The rest of her liver enzymes have remained normal.  Lab Results  Component Value Date   ALKPHOS 172 (H) 07/02/2019   Lab Results  Component Value Date   ALT 17 07/02/2019   AST 20 07/02/2019   ALKPHOS 172 (H) 07/02/2019   BILITOT 0.8 07/02/2019    Leukocytosis - difficult to assess with one CBC.  Could even be reactive.  Will repeat CBC with diff and get pathology smear.   CBC EXTENDED Latest Ref Rng & Units 07/02/2019 07/26/2018 01/31/2018  WBC 3.4 - 10.8 x10E3/uL 11.7(H) 8.6 8.5  RBC 3.77 - 5.28 x10E6/uL 4.71 4.70 4.60  HGB 11.1 - 15.9 g/dL 12.5 12.9 12.6  HCT 34.0 - 46.6 % 37.2 37.8 37.4  PLT 150 - 450 x10E3/uL 366 297.0 311.0  NEUTROABS 1.4 - 7.7 K/uL - 4.8 -  LYMPHSABS 0.7 - 4.0 K/uL - 3.0 -      HLD- she is taking lipitor 80 mg daily.  Lab Results  Component Value  Date   CHOL 116 07/02/2019   HDL 31 (L) 07/02/2019   LDLCALC 63 07/02/2019   LDLDIRECT 151.0 09/09/2011   TRIG 123 07/02/2019   CHOLHDL 3.7 07/02/2019   Lab Results  Component Value Date   ALT 17 07/02/2019   AST 20 07/02/2019   ALKPHOS 172 (H) 07/02/2019   BILITOT 0.8 07/02/2019    Patient Active Problem List   Diagnosis Date Noted   Chronic systolic heart failure ([place]) 07/11/2019   Elevated serum alkaline phosphatase level 07/10/2019   Leukocytosis 07/10/2019   Bilateral hand pain 04/09/2019   Bilateral hearing loss 04/09/2019   Trigger finger, left middle finger 10/30/2018   E. coli UTI  (urinary tract infection) 09/05/2018   Lumbar back pain with radiculopathy affecting lower extremity 05/22/2018   Osteoporosis 05/15/2018   Lumbar radiculopathy 04/27/2018   GERD (gastroesophageal reflux disease) 04/10/2018   Allergic reaction caused by a drug 11/02/2017   Dyspnea on exertion 08/21/2017   DDD (degenerative disc disease), thoracolumbar 04/28/2017   Back pain 04/21/2017   Hypokalemia 01/06/2017   NSTEMI (non-ST elevated myocardial infarction) ([place]) 01/01/2017   Fatigue 11/27/2014   RBBB 12/19/2013   Mitral valve prolapse    HYPERTENSION, BENIGN 08/27/2010   Anxiety state 06/27/2009   Coronary atherosclerosis 06/27/2009   Mitral valve disorder 06/27/2009   HEMORRHOIDS 06/27/2009   Allergic rhinitis 06/27/2009   Disorder of bone and cartilage 11/11/2006    Social History   Tobacco Use   Smoking status: Never Smoker   Smokeless tobacco: Never Used  Substance Use Topics   Alcohol use: No    Alcohol/week: 0.0 standard drinks    Current Outpatient Medications:    acetaminophen (TYLENOL) 500 MG tablet, Take 1 tablet (500 mg total) by mouth 2 (two) times daily., Disp: 60 tablet, Rfl: 0   albuterol (PROVENTIL HFA;VENTOLIN HFA) 108 (90 Base) MCG/ACT inhaler, Inhale 1 puff into the lungs every 6 (six) hours as needed for wheezing or shortness of breath., Disp: 1 Inhaler, Rfl: PRN   amLODipine (NORVASC) 5 MG tablet, Take 1 tablet (5 mg total) by mouth daily., Disp: 90 tablet, Rfl: 3   aspirin EC 81 MG tablet, Take 81 mg by mouth every morning. , Disp: , Rfl:    atorvastatin (LIPITOR) 80 MG tablet, TAKE 1 TABLET BY MOUTH ONCE A DAY AT 6PM, Disp: 90 tablet, Rfl: 3   Calcium-Magnesium-Vitamin D (CALCIUM 1200+D3 PO), Take 1,200 mg by mouth., Disp: , Rfl:    Cholecalciferol (VITAMIN D3) 1000 UNITS CAPS, Take 1 capsule by mouth every morning. , Disp: , Rfl:    diclofenac sodium (VOLTAREN) 1 % GEL, Apply 4 g topically 4 (four) times daily. To  affected joint., Disp: 100 g, Rfl: 2   fluticasone (FLONASE) 50 MCG/ACT nasal spray, Place 1 spray into both nostrils as needed. , Disp: , Rfl:    furosemide (LASIX) 20 MG tablet, Take 1 tablet (20 mg total) by mouth daily as needed., Disp: 90 tablet, Rfl: 3   gabapentin (NEURONTIN) 300 MG capsule, Take 1 capsule (300 mg total) by mouth 3 (three) times daily., Disp: 90 capsule, Rfl: 3   hydrochlorothiazide (HYDRODIURIL) 25 MG tablet, Take 1 tablet (25 mg total) by mouth daily., Disp: 90 tablet, Rfl: 3   isosorbide mononitrate (IMDUR) 30 MG 24 hr tablet, Take 1 tablet (30 mg total) by mouth daily., Disp: 90 tablet, Rfl: 3   loratadine (CLARITIN) 10 MG tablet, Take 1 tablet (10 mg total) by mouth every morning., Disp: 30  tablet, Rfl: 2   losartan (COZAAR) 100 MG tablet, Take 1 tablet (100 mg total) by mouth daily., Disp: 90 tablet, Rfl: 3   metoprolol succinate (TOPROL-XL) 25 MG 24 hr tablet, Take 1 tablet (25 mg total) by mouth daily., Disp: 90 tablet, Rfl: 3   Multiple Vitamin (MULTIVITAMIN) tablet, Take 1 tablet by mouth every morning. , Disp: , Rfl:    nitroGLYCERIN (NITROSTAT) 0.4 MG SL tablet, Place 1 tablet (0.4 mg total) under the tongue every 5 (five) minutes as needed for chest pain., Disp: 25 tablet, Rfl: 3   Omega-3 Fatty Acids (FISH OIL) 1200 MG CAPS, Take 1 capsule by mouth every morning. , Disp: , Rfl:    omeprazole (PRILOSEC) 40 MG capsule, Take 40 mg by mouth daily. , Disp: , Rfl:    oxyCODONE (OXY IR/ROXICODONE) 5 MG immediate release tablet, Take 1 tablet (5 mg total) by mouth every 4 (four) hours as needed for severe pain., Disp: 30 tablet, Rfl: 0   potassium chloride SA (K-DUR) 20 MEQ tablet, Take 2 tablets (40 mEq total) by mouth 2 (two) times daily., Disp: 360 tablet, Rfl: 3   tiZANidine (ZANAFLEX) 4 MG tablet, Take 0.5-1 tablets (2-4 mg total) by mouth as needed., Disp: 30 tablet, Rfl: 1   traMADol (ULTRAM) 50 MG tablet, TAKE 1 TABLET BY MOUTH 4 TIMES DAILY AS  NEEDED, Disp: 90 tablet, Rfl: 0   vitamin B-12 (CYANOCOBALAMIN) 100 MCG tablet, Take 1 tablet (100 mcg total) by mouth daily., Disp: , Rfl:    ciprofloxacin (CIPRO) 250 MG tablet, Take 1 tablet (250 mg total) by mouth 2 (two) times daily., Disp: 10 tablet, Rfl: 0  Allergies  Allergen Reactions   Bactroban [Mupirocin Calcium]     Facial redness and swelling   Codeine     REACTION: nausea and vomiting   Penicillins Nausea And Vomiting    REACTION: rash   Valtrex [Valacyclovir Hcl] Swelling    Per pt causes redness    Objective:  BP (!) 150/79 (BP Location: Right Arm, Patient Position: Sitting, Cuff Size: Normal)    Pulse 79    Ht 5' 2" (1.575 m)    Wt 149 lb (67.6 kg)    LMP  (LMP Unknown)    SpO2 98%    BMI 27.25 kg/m   BP Readings from Last 3 Encounters:  07/11/19 (!) 150/79  07/02/19 122/72  04/30/19 130/70    Physical Exam  Constitutional: She is oriented to person, place, and time and well-developed, well-nourished, and in no distress. No distress.  HENT:  Head: Normocephalic and atraumatic.  Eyes: Pupils are equal, round, and reactive to light.  Cardiovascular: Normal rate and regular rhythm.  Pulmonary/Chest: Effort normal and breath sounds normal. No respiratory distress. She has no wheezes. She has no rales. She exhibits no tenderness.  Abdominal: Soft.  Musculoskeletal: Normal range of motion.        General: No edema.  Neurological: She is alert and oriented to person, place, and time.  Skin: Skin is dry. She is not diaphoretic.  Psychiatric: Mood, memory, affect and judgment normal.     Depression screen [facility] 2/9 07/11/2019 04/25/2019 04/19/2018  Decreased Interest 1 0 0  Down, Depressed, Hopeless 1 0 0  PHQ - 2 Score 2 0 0  Altered sleeping 3 - -  Tired, decreased energy 1 - -  Change in appetite 0 - -  Feeling bad or failure about yourself  1 - -  Trouble concentrating 1 - -  Moving slowly or fidgety/restless 0 - -  PHQ-9 Score 8 - -  Difficult doing  work/chores Somewhat difficult - -  Some recent data might be hidden      COVID-19 Education: The signs and symptoms of COVID-19 were discussed with the patient and how to seek care for testing if needed. The importance of social distancing was discussed today.  Reviewed expectations re: course of current medical issues.  Discussed self-management of symptoms.  Outlined signs and symptoms indicating need for more acute intervention.  Patient verbalized understanding and all questions were answered.  Health Maintenance issues including appropriate healthy diet, exercise, and smoking avoidance were discussed with patient.  See orders for this visit as documented in the electronic medical record.  Arnette Norris, MD  Records requested if needed. Time spent: 40 minutes, of which >50% was spent in obtaining information about her symptoms, reviewing her previous labs, evaluations, and treatments, counseling her about her condition (please see the discussed topics above), and developing a plan to further investigate it; she had a number of questions which I addressed.     Review of Systems  Constitutional: Negative for fever and malaise/fatigue.  HENT: Negative for congestion and hearing loss.   Eyes: Negative for blurred vision, discharge and redness.  Respiratory: Negative for cough and shortness of breath.   Cardiovascular: Negative for chest pain, palpitations and leg swelling.  Gastrointestinal: Negative for abdominal pain, blood in stool, constipation, diarrhea, heartburn, melena, nausea and vomiting.  Genitourinary: Positive for dysuria, frequency and urgency. Negative for flank pain and hematuria.  Musculoskeletal: Negative for falls.  Skin: Negative for rash.  Neurological: Negative for loss of consciousness and headaches.  Endo/Heme/Allergies: Does not bruise/bleed easily.  Psychiatric/Behavioral: Negative for depression, hallucinations, memory loss, substance abuse and suicidal  ideas. The patient is not nervous/anxious and does not have insomnia.   All other systems reviewed and are negative.       Lab Results  Component Value Date   WBC 11.7 (H) 07/02/2019   HGB 12.5 07/02/2019   HCT 37.2 07/02/2019   PLT 366 07/02/2019   GLUCOSE 92 07/02/2019   CHOL 116 07/02/2019   TRIG 123 07/02/2019   HDL 31 (L) 07/02/2019   LDLDIRECT 151.0 09/09/2011   LDLCALC 63 07/02/2019   ALT 17 07/02/2019   AST 20 07/02/2019   NA 142 07/02/2019   K 3.9 07/02/2019   CL 101 07/02/2019   CREATININE 0.95 07/02/2019   BUN 11 07/02/2019   CO2 24 07/02/2019   TSH 2.00 07/26/2018   INR 1.12 01/02/2017   HGBA1C 6.1 (H) 01/02/2017    Lab Results  Component Value Date   TSH 2.00 07/26/2018   Lab Results  Component Value Date   WBC 11.7 (H) 07/02/2019   HGB 12.5 07/02/2019   HCT 37.2 07/02/2019   MCV 79 07/02/2019   PLT 366 07/02/2019   Lab Results  Component Value Date   NA 142 07/02/2019   K 3.9 07/02/2019   CO2 24 07/02/2019   GLUCOSE 92 07/02/2019   BUN 11 07/02/2019   CREATININE 0.95 07/02/2019   BILITOT 0.8 07/02/2019   ALKPHOS 172 (H) 07/02/2019   AST 20 07/02/2019   ALT 17 07/02/2019   PROT 6.7 07/02/2019   ALBUMIN 4.2 07/02/2019   CALCIUM 9.6 07/02/2019   ANIONGAP 7 08/21/2017   GFR 82.51 07/26/2018   Lab Results  Component Value Date   CHOL 116 07/02/2019   Lab Results  Component Value Date  HDL 31 (L) 07/02/2019   Lab Results  Component Value Date   LDLCALC 63 07/02/2019   Lab Results  Component Value Date   TRIG 123 07/02/2019   Lab Results  Component Value Date   CHOLHDL 3.7 07/02/2019   Lab Results  Component Value Date   HGBA1C 6.1 (H) 01/02/2017       Assessment & Plan:   Problem List Items Addressed This Visit      Active Problems   HYPERTENSION, BENIGN    Well controlled, no changes to meds. Encouraged heart healthy diet such as the DASH diet and exercise as tolerated.   This is being managed by cardiology-  currently taking  HCTZ 25 mg daily, norvasc 5 mg daily, Imdur 30 mg daily, Cozaar 100 mg daily, Toprol XL 25 mg daily along with as needed lasix and as needed NTG.      Relevant Medications   isosorbide mononitrate (IMDUR) 30 MG 24 hr tablet   hydrochlorothiazide (HYDRODIURIL) 25 MG tablet   amLODipine (NORVASC) 5 MG tablet   metoprolol succinate (TOPROL-XL) 25 MG 24 hr tablet   Fatigue   Relevant Orders   CBC with Differential/Platelet   Pathologist smear review   NSTEMI (non-ST elevated myocardial infarction) ([place]) - Primary    CAD- followed by Dr. Marlou Porch. H/o prior ST elevation myocardial infarction on 01/01/17 stating that she had chest pressure that waxed and waned then returned, troponin was 0.16. She received 2 stents to the LAD, at that time EF was 40- 45%, now normal.   Sees Dr. Marlou Porch. Attended cardiac rehab yesterday.  Last saw Truitt Merle, cardiology NP on 07/02/19 for 6 month follow up.  Note reviewed.   Has had issues ever since with atypical chest pain and fatigue - Brilinta changed to Plavix - was on Effient - not just aspirin. Beta blocker has been reduced. Chronic fatigue still noted.  I do not see plavix on med list- I see ASA 81 mg daily. On statin and betablocker.      Relevant Medications   isosorbide mononitrate (IMDUR) 30 MG 24 hr tablet   hydrochlorothiazide (HYDRODIURIL) 25 MG tablet   amLODipine (NORVASC) 5 MG tablet   metoprolol succinate (TOPROL-XL) 25 MG 24 hr tablet   Lumbar back pain with radiculopathy affecting lower extremity    She would like to increase her dose of Gabapentin back to 300 mg three times daily again which is reasonable. [place] sent.      Relevant Medications   traMADol (ULTRAM) 50 MG tablet   tiZANidine (ZANAFLEX) 4 MG tablet   gabapentin (NEURONTIN) 300 MG capsule   E. coli UTI (urinary tract infection)    Due to recent E coli UTI will add UA, urine mciro and urine cx to labs as well to rule out other causes of her  leukocytosis. eRx aslo sent in for Cipro (PCN allergic) for her to take after she leaves urine sample.      Relevant Orders   Urinalysis, Routine w reflex microscopic   Elevated serum alkaline phosphatase level    Elevated Alk phos- increased to 172 from 148  Six months ago.  I was however 144 9 months ago and 166 a year ago.  In fact, looking back several years, her Alk phos began to increase shortly after her MI- it was 136 in 02/2017.   The rest of her liver enzymes have remained normal.  Beta blockers cause elevation in alkaline phosphatase and I am certain she has been  taking one since her MI.  She has never reached levels 3 times the upper limits of normal, which is reassuring. Hepatic C ab neg in 2017. Check TSH and PTH as they can contribute to elevated alk phos. Since she is still has her gallbladder and biliary ductal dilation an choledocholithiasis is a common cause, we could proceed with RUQ GGT, hepatitis panel for initial further work up.   I suspect this is due to her medication that were started after her MI and they really have fluctuated but remained within her baseline range for 2 1/2 years.       Relevant Orders   US Abdomen Limited RUQ   Gamma GT   Hepatitis A antibody, total   Hepatitis B core antibody, total   Hepatitis B e antibody   Hepatitis B surface antibody   Hepatitis C antibody   PTH, Intact and Calcium   TSH   T4, free   Leukocytosis    Leukocytosis - difficult to assess with one CBC.  Could even be reactive.  Will repeat CBC with diff and get pathology smear.  Will check UA today as well. eRx sent       Chronic systolic heart failure ([facility])   Relevant Medications   isosorbide mononitrate (IMDUR) 30 MG 24 hr tablet   hydrochlorothiazide (HYDRODIURIL) 25 MG tablet   amLODipine (NORVASC) 5 MG tablet   metoprolol succinate (TOPROL-XL) 25 MG 24 hr tablet    Other Visit Diagnoses    Adjustment disorder with anxious mood       Insomnia due to stress           I have discontinued [place]. Uddin's gabapentin. I have also changed her tiZANidine and gabapentin. Additionally, I am having her start on ciprofloxacin. Lastly, I am having her maintain her Fish Oil, multivitamin, Vitamin D3, aspirin EC, loratadine, omeprazole, fluticasone, acetaminophen, oxyCODONE, Calcium-Magnesium-Vitamin D (CALCIUM 1200+D3 PO), losartan, atorvastatin, vitamin B-12, albuterol, furosemide, potassium chloride SA, diclofenac sodium, nitroGLYCERIN, traMADol, isosorbide mononitrate, hydrochlorothiazide, amLODipine, and metoprolol succinate.  Meds ordered this encounter  Medications   DISCONTD: gabapentin (NEURONTIN) 300 MG capsule    Sig: Take 1 capsule (300 mg total) by mouth 2 (two) times daily.    Dispense:  60 capsule    Refill:  3   traMADol (ULTRAM) 50 MG tablet    Sig: TAKE 1 TABLET BY MOUTH 4 TIMES DAILY AS NEEDED    Dispense:  90 tablet    Refill:  0   tiZANidine (ZANAFLEX) 4 MG tablet    Sig: Take 0.5-1 tablets (2-4 mg total) by mouth as needed.    Dispense:  30 tablet    Refill:  1   isosorbide mononitrate (IMDUR) 30 MG 24 hr tablet    Sig: Take 1 tablet (30 mg total) by mouth daily.    Dispense:  90 tablet    Refill:  3   hydrochlorothiazide (HYDRODIURIL) 25 MG tablet    Sig: Take 1 tablet (25 mg total) by mouth daily.    Dispense:  90 tablet    Refill:  3   amLODipine (NORVASC) 5 MG tablet    Sig: Take 1 tablet (5 mg total) by mouth daily.    Dispense:  90 tablet    Refill:  3   metoprolol succinate (TOPROL-XL) 25 MG 24 hr tablet    Sig: Take 1 tablet (25 mg total) by mouth daily.    Dispense:  90 tablet    Refill:  3  gabapentin (NEURONTIN) 300 MG capsule    Sig: Take 1 capsule (300 mg total) by mouth 3 (three) times daily.    Dispense:  90 capsule    Refill:  3   ciprofloxacin (CIPRO) 250 MG tablet    Sig: Take 1 tablet (250 mg total) by mouth 2 (two) times daily.    Dispense:  10 tablet    Refill:  0     Arnette Norris,  MD

## 2019-07-10 NOTE — Assessment & Plan Note (Addendum)
Leukocytosis - difficult to assess with one CBC.  Could even be reactive.  Will repeat CBC with diff and get pathology smear.  Will check UA today as well. eRx sent

## 2019-07-10 NOTE — Assessment & Plan Note (Signed)
Well controlled, no changes to meds. Encouraged heart healthy diet such as the DASH diet and exercise as tolerated.   This is being managed by cardiology- currently taking  HCTZ 25 mg daily, norvasc 5 mg daily, Imdur 30 mg daily, Cozaar 100 mg daily, Toprol XL 25 mg daily along with as needed lasix and as needed NTG.

## 2019-07-10 NOTE — Assessment & Plan Note (Addendum)
Elevated Alk phos- increased to 172 from 148  Six months ago.  I was however 144 9 months ago and 166 a year ago.  In fact, looking back several years, her Alk phos began to increase shortly after her MI- it was 136 in 02/2017.   The rest of her liver enzymes have remained normal.  Beta blockers cause elevation in alkaline phosphatase and I am certain she has been taking one since her MI.  She has never reached levels 3 times the upper limits of normal, which is reassuring. Hepatic C ab neg in 2017. Check TSH and PTH as they can contribute to elevated alk phos. Since she is still has her gallbladder and biliary ductal dilation an choledocholithiasis is a common cause, we could proceed with RUQ GGT, hepatitis panel for initial further work up.   I suspect this is due to her medication that were started after her MI and they really have fluctuated but remained within her baseline range for 2 1/2 years.

## 2019-07-11 ENCOUNTER — Ambulatory Visit (INDEPENDENT_AMBULATORY_CARE_PROVIDER_SITE_OTHER): Payer: PPO | Admitting: Family Medicine

## 2019-07-11 ENCOUNTER — Other Ambulatory Visit (INDEPENDENT_AMBULATORY_CARE_PROVIDER_SITE_OTHER): Payer: PPO

## 2019-07-11 ENCOUNTER — Encounter: Payer: Self-pay | Admitting: Family Medicine

## 2019-07-11 VITALS — BP 150/79 | HR 79 | Ht 62.0 in | Wt 149.0 lb

## 2019-07-11 DIAGNOSIS — R748 Abnormal levels of other serum enzymes: Secondary | ICD-10-CM | POA: Diagnosis not present

## 2019-07-11 DIAGNOSIS — F5102 Adjustment insomnia: Secondary | ICD-10-CM

## 2019-07-11 DIAGNOSIS — B9620 Unspecified Escherichia coli [E. coli] as the cause of diseases classified elsewhere: Secondary | ICD-10-CM

## 2019-07-11 DIAGNOSIS — I10 Essential (primary) hypertension: Secondary | ICD-10-CM

## 2019-07-11 DIAGNOSIS — I214 Non-ST elevation (NSTEMI) myocardial infarction: Secondary | ICD-10-CM

## 2019-07-11 DIAGNOSIS — R5382 Chronic fatigue, unspecified: Secondary | ICD-10-CM

## 2019-07-11 DIAGNOSIS — N390 Urinary tract infection, site not specified: Secondary | ICD-10-CM

## 2019-07-11 DIAGNOSIS — F4322 Adjustment disorder with anxiety: Secondary | ICD-10-CM | POA: Diagnosis not present

## 2019-07-11 DIAGNOSIS — I5022 Chronic systolic (congestive) heart failure: Secondary | ICD-10-CM | POA: Diagnosis not present

## 2019-07-11 DIAGNOSIS — M5416 Radiculopathy, lumbar region: Secondary | ICD-10-CM

## 2019-07-11 DIAGNOSIS — D72829 Elevated white blood cell count, unspecified: Secondary | ICD-10-CM | POA: Diagnosis not present

## 2019-07-11 LAB — URINALYSIS, ROUTINE W REFLEX MICROSCOPIC
Bilirubin Urine: NEGATIVE
Hgb urine dipstick: NEGATIVE
Ketones, ur: NEGATIVE
Nitrite: NEGATIVE
RBC / HPF: NONE SEEN (ref 0–?)
Specific Gravity, Urine: 1.02 (ref 1.000–1.030)
Total Protein, Urine: NEGATIVE
Urine Glucose: NEGATIVE
Urobilinogen, UA: 0.2 (ref 0.0–1.0)
pH: 6 (ref 5.0–8.0)

## 2019-07-11 LAB — CBC WITH DIFFERENTIAL/PLATELET
Basophils Absolute: 0.1 10*3/uL (ref 0.0–0.1)
Basophils Relative: 0.5 % (ref 0.0–3.0)
Eosinophils Absolute: 0.2 10*3/uL (ref 0.0–0.7)
Eosinophils Relative: 1.5 % (ref 0.0–5.0)
HCT: 37.7 % (ref 36.0–46.0)
Hemoglobin: 12.3 g/dL (ref 12.0–15.0)
Lymphocytes Relative: 29.6 % (ref 12.0–46.0)
Lymphs Abs: 3.7 10*3/uL (ref 0.7–4.0)
MCHC: 32.7 g/dL (ref 30.0–36.0)
MCV: 82.8 fl (ref 78.0–100.0)
Monocytes Absolute: 0.7 10*3/uL (ref 0.1–1.0)
Monocytes Relative: 5.7 % (ref 3.0–12.0)
Neutro Abs: 7.8 10*3/uL — ABNORMAL HIGH (ref 1.4–7.7)
Neutrophils Relative %: 62.7 % (ref 43.0–77.0)
Platelets: 344 10*3/uL (ref 150.0–400.0)
RBC: 4.56 Mil/uL (ref 3.87–5.11)
RDW: 15.5 % (ref 11.5–15.5)
WBC: 12.5 10*3/uL — ABNORMAL HIGH (ref 4.0–10.5)

## 2019-07-11 LAB — T4, FREE: Free T4: 0.87 ng/dL (ref 0.60–1.60)

## 2019-07-11 LAB — TSH: TSH: 2.57 u[IU]/mL (ref 0.35–4.50)

## 2019-07-11 LAB — GAMMA GT: GGT: 27 U/L (ref 7–51)

## 2019-07-11 MED ORDER — AMLODIPINE BESYLATE 5 MG PO TABS: 5.0000 mg | ORAL_TABLET | Freq: Every day | ORAL | 3 refills | Status: DC

## 2019-07-11 MED ORDER — TRAMADOL HCL 50 MG PO TABS: ORAL_TABLET | ORAL | 0 refills | Status: DC

## 2019-07-11 MED ORDER — GABAPENTIN 300 MG PO CAPS: 300.0000 mg | ORAL_CAPSULE | Freq: Three times a day (TID) | ORAL | 3 refills | Status: DC

## 2019-07-11 MED ORDER — METOPROLOL SUCCINATE ER 25 MG PO TB24: 25.0000 mg | ORAL_TABLET | Freq: Every day | ORAL | 3 refills | Status: DC

## 2019-07-11 MED ORDER — ISOSORBIDE MONONITRATE ER 30 MG PO TB24: 30.0000 mg | ORAL_TABLET | Freq: Every day | ORAL | 3 refills | Status: DC

## 2019-07-11 MED ORDER — HYDROCHLOROTHIAZIDE 25 MG PO TABS: 25.0000 mg | ORAL_TABLET | Freq: Every day | ORAL | 3 refills | Status: DC

## 2019-07-11 MED ORDER — CIPROFLOXACIN HCL 250 MG PO TABS: 250.0000 mg | ORAL_TABLET | Freq: Two times a day (BID) | ORAL | 0 refills | Status: DC

## 2019-07-11 MED ORDER — TIZANIDINE HCL 4 MG PO TABS: 2.0000 mg | ORAL_TABLET | ORAL | 1 refills | Status: DC | PRN

## 2019-07-11 MED ORDER — GABAPENTIN 300 MG PO CAPS: 300.0000 mg | ORAL_CAPSULE | Freq: Two times a day (BID) | ORAL | 3 refills | Status: DC

## 2019-07-11 NOTE — Assessment & Plan Note (Addendum)
She would like to increase her dose of Gabapentin back to 300 mg three times daily again which is reasonable. [place] sent.

## 2019-07-13 LAB — PATHOLOGIST SMEAR REVIEW

## 2019-07-13 LAB — HEPATITIS A ANTIBODY, TOTAL: Hepatitis A AB,Total: REACTIVE — AB

## 2019-07-13 LAB — PTH, INTACT AND CALCIUM
Calcium: 9.9 mg/dL (ref 8.6–10.4)
PTH: 31 pg/mL (ref 14–64)

## 2019-07-13 LAB — HEPATITIS B CORE ANTIBODY, TOTAL: Hep B Core Total Ab: NONREACTIVE

## 2019-07-13 LAB — HEPATITIS B SURFACE ANTIBODY,QUALITATIVE: Hep B S Ab: NONREACTIVE

## 2019-07-13 LAB — HEPATITIS C ANTIBODY
Hepatitis C Ab: NONREACTIVE
SIGNAL TO CUT-OFF: 0.01 (ref <1.00)

## 2019-07-13 LAB — HEPATITIS B E ANTIBODY: Hep B E Ab: NONREACTIVE

## 2019-07-16 ENCOUNTER — Other Ambulatory Visit: Payer: Self-pay | Admitting: Family Medicine

## 2019-07-16 ENCOUNTER — Encounter: Payer: Self-pay | Admitting: Family Medicine

## 2019-07-16 DIAGNOSIS — R748 Abnormal levels of other serum enzymes: Secondary | ICD-10-CM

## 2019-07-16 DIAGNOSIS — N3000 Acute cystitis without hematuria: Secondary | ICD-10-CM

## 2019-07-16 DIAGNOSIS — D72829 Elevated white blood cell count, unspecified: Secondary | ICD-10-CM

## 2019-07-16 DIAGNOSIS — R768 Other specified abnormal immunological findings in serum: Secondary | ICD-10-CM

## 2019-07-16 MED ORDER — CIPROFLOXACIN HCL 250 MG PO TABS: 250.0000 mg | ORAL_TABLET | Freq: Two times a day (BID) | ORAL | 0 refills | Status: DC

## 2019-07-17 ENCOUNTER — Ambulatory Visit: Payer: PPO | Admitting: Family Medicine

## 2019-07-17 ENCOUNTER — Encounter (HOSPITAL_COMMUNITY): Payer: Self-pay

## 2019-07-17 DIAGNOSIS — I214 Non-ST elevation (NSTEMI) myocardial infarction: Secondary | ICD-10-CM | POA: Insufficient documentation

## 2019-07-19 ENCOUNTER — Encounter (HOSPITAL_COMMUNITY): Payer: Self-pay

## 2019-07-23 ENCOUNTER — Other Ambulatory Visit: Payer: PPO

## 2019-07-23 ENCOUNTER — Other Ambulatory Visit (INDEPENDENT_AMBULATORY_CARE_PROVIDER_SITE_OTHER): Payer: PPO

## 2019-07-23 ENCOUNTER — Other Ambulatory Visit: Payer: Self-pay

## 2019-07-23 DIAGNOSIS — R748 Abnormal levels of other serum enzymes: Secondary | ICD-10-CM

## 2019-07-23 DIAGNOSIS — R768 Other specified abnormal immunological findings in serum: Secondary | ICD-10-CM

## 2019-07-23 DIAGNOSIS — D72829 Elevated white blood cell count, unspecified: Secondary | ICD-10-CM

## 2019-07-23 DIAGNOSIS — N3000 Acute cystitis without hematuria: Secondary | ICD-10-CM

## 2019-07-23 LAB — URINALYSIS, ROUTINE W REFLEX MICROSCOPIC
Bilirubin Urine: NEGATIVE
Hgb urine dipstick: NEGATIVE
Ketones, ur: NEGATIVE
Nitrite: NEGATIVE
RBC / HPF: NONE SEEN (ref 0–?)
Specific Gravity, Urine: 1.015 (ref 1.000–1.030)
Total Protein, Urine: NEGATIVE
Urine Glucose: NEGATIVE
Urobilinogen, UA: 0.2 (ref 0.0–1.0)
pH: 7 (ref 5.0–8.0)

## 2019-07-23 LAB — CBC WITH DIFFERENTIAL/PLATELET
Basophils Absolute: 0 10*3/uL (ref 0.0–0.1)
Basophils Relative: 0.4 % (ref 0.0–3.0)
Eosinophils Absolute: 0.2 10*3/uL (ref 0.0–0.7)
Eosinophils Relative: 2.4 % (ref 0.0–5.0)
HCT: 40.1 % (ref 36.0–46.0)
Hemoglobin: 13 g/dL (ref 12.0–15.0)
Lymphocytes Relative: 28.9 % (ref 12.0–46.0)
Lymphs Abs: 3 10*3/uL (ref 0.7–4.0)
MCHC: 32.4 g/dL (ref 30.0–36.0)
MCV: 83.4 fl (ref 78.0–100.0)
Monocytes Absolute: 0.7 10*3/uL (ref 0.1–1.0)
Monocytes Relative: 6.4 % (ref 3.0–12.0)
Neutro Abs: 6.4 10*3/uL (ref 1.4–7.7)
Neutrophils Relative %: 61.9 % (ref 43.0–77.0)
Platelets: 332 10*3/uL (ref 150.0–400.0)
RBC: 4.81 Mil/uL (ref 3.87–5.11)
RDW: 15.3 % (ref 11.5–15.5)
WBC: 10.3 10*3/uL (ref 4.0–10.5)

## 2019-07-23 LAB — COMPREHENSIVE METABOLIC PANEL
ALT: 20 U/L (ref 0–35)
AST: 23 U/L (ref 0–37)
Albumin: 4.2 g/dL (ref 3.5–5.2)
Alkaline Phosphatase: 142 U/L — ABNORMAL HIGH (ref 39–117)
BUN: 11 mg/dL (ref 6–23)
CO2: 30 mEq/L (ref 19–32)
Calcium: 9.9 mg/dL (ref 8.4–10.5)
Chloride: 103 mEq/L (ref 96–112)
Creatinine, Ser: 0.89 mg/dL (ref 0.40–1.20)
GFR: 62.56 mL/min (ref >60.00)
Glucose, Bld: 111 mg/dL — ABNORMAL HIGH (ref 70–99)
Potassium: 3.2 mEq/L — ABNORMAL LOW (ref 3.5–5.1)
Sodium: 143 mEq/L (ref 135–145)
Total Bilirubin: 0.8 mg/dL (ref 0.2–1.2)
Total Protein: 7.4 g/dL (ref 6.0–8.3)

## 2019-07-24 ENCOUNTER — Encounter (HOSPITAL_COMMUNITY)
Admission: RE | Admit: 2019-07-24 | Discharge: 2019-07-24 | Disposition: A | Discharge status: Home / Self Care | Payer: Self-pay | Source: Ambulatory Visit | Attending: Cardiology | Admitting: Cardiology

## 2019-07-24 LAB — URINE CULTURE
MICRO NUMBER:: 1170538
Result:: NO GROWTH
SPECIMEN QUALITY:: ADEQUATE

## 2019-07-24 LAB — HEPATITIS PANEL, ACUTE
Hep A IgM: NONREACTIVE
Hep B C IgM: NONREACTIVE
Hepatitis B Surface Ag: NONREACTIVE
Hepatitis C Ab: NONREACTIVE
SIGNAL TO CUT-OFF: 0.01 (ref <1.00)

## 2019-07-25 ENCOUNTER — Other Ambulatory Visit: Payer: PPO

## 2019-07-26 ENCOUNTER — Encounter (HOSPITAL_COMMUNITY)
Admission: RE | Admit: 2019-07-26 | Discharge: 2019-07-26 | Disposition: A | Discharge status: Home / Self Care | Payer: PPO | Source: Ambulatory Visit | Attending: Cardiology | Admitting: Cardiology

## 2019-07-26 ENCOUNTER — Other Ambulatory Visit: Payer: Self-pay

## 2019-07-27 ENCOUNTER — Ambulatory Visit
Admission: RE | Admit: 2019-07-27 | Discharge: 2019-07-27 | Disposition: A | Discharge status: Home / Self Care | Payer: PPO | Source: Ambulatory Visit | Attending: Family Medicine | Admitting: Family Medicine

## 2019-07-27 DIAGNOSIS — K7689 Other specified diseases of liver: Secondary | ICD-10-CM | POA: Diagnosis not present

## 2019-07-27 DIAGNOSIS — R748 Abnormal levels of other serum enzymes: Secondary | ICD-10-CM

## 2019-07-31 ENCOUNTER — Other Ambulatory Visit: Payer: Self-pay

## 2019-07-31 ENCOUNTER — Telehealth: Payer: Self-pay

## 2019-07-31 ENCOUNTER — Encounter (HOSPITAL_COMMUNITY)
Admission: RE | Admit: 2019-07-31 | Discharge: 2019-07-31 | Disposition: A | Discharge status: Home / Self Care | Payer: Self-pay | Source: Ambulatory Visit | Attending: Cardiology | Admitting: Cardiology

## 2019-07-31 NOTE — Telephone Encounter (Signed)
Patient would like a call in regards to her lab results.  Pt needed them explained to her.  Please advise.

## 2019-08-01 ENCOUNTER — Other Ambulatory Visit: Payer: Self-pay | Admitting: Nurse Practitioner

## 2019-08-01 ENCOUNTER — Other Ambulatory Visit: Payer: Self-pay | Admitting: Family Medicine

## 2019-08-01 DIAGNOSIS — K760 Fatty (change of) liver, not elsewhere classified: Secondary | ICD-10-CM

## 2019-08-01 DIAGNOSIS — R768 Other specified abnormal immunological findings in serum: Secondary | ICD-10-CM

## 2019-08-01 DIAGNOSIS — R748 Abnormal levels of other serum enzymes: Secondary | ICD-10-CM

## 2019-08-01 NOTE — Telephone Encounter (Signed)
Called pt to discuss lab results and [place] results.

## 2019-08-02 ENCOUNTER — Encounter (HOSPITAL_COMMUNITY): Payer: Self-pay

## 2019-08-05 NOTE — Telephone Encounter (Signed)
I have spoken with patient.  Thank you.

## 2019-08-07 ENCOUNTER — Other Ambulatory Visit: Payer: Self-pay

## 2019-08-07 ENCOUNTER — Encounter (HOSPITAL_COMMUNITY)
Admission: RE | Admit: 2019-08-07 | Discharge: 2019-08-07 | Disposition: A | Discharge status: Home / Self Care | Payer: Self-pay | Source: Ambulatory Visit | Attending: Cardiology | Admitting: Cardiology

## 2019-08-09 ENCOUNTER — Encounter (HOSPITAL_COMMUNITY): Payer: Self-pay

## 2019-08-14 ENCOUNTER — Encounter (HOSPITAL_COMMUNITY): Payer: Self-pay

## 2019-08-16 ENCOUNTER — Encounter (HOSPITAL_COMMUNITY): Payer: Self-pay

## 2019-08-21 ENCOUNTER — Encounter (HOSPITAL_COMMUNITY): Payer: Self-pay

## 2019-08-21 ENCOUNTER — Encounter: Payer: Self-pay | Admitting: Internal Medicine

## 2019-08-23 ENCOUNTER — Encounter (HOSPITAL_COMMUNITY): Payer: Self-pay

## 2019-08-28 ENCOUNTER — Encounter (HOSPITAL_COMMUNITY): Payer: Self-pay

## 2019-08-28 DIAGNOSIS — M545 Low back pain: Secondary | ICD-10-CM | POA: Diagnosis not present

## 2019-08-28 DIAGNOSIS — M47816 Spondylosis without myelopathy or radiculopathy, lumbar region: Secondary | ICD-10-CM | POA: Diagnosis not present

## 2019-08-30 ENCOUNTER — Encounter (HOSPITAL_COMMUNITY): Payer: Self-pay

## 2019-09-04 ENCOUNTER — Encounter (HOSPITAL_COMMUNITY): Payer: Self-pay

## 2019-09-05 NOTE — Progress Notes (Signed)
Virtual Visit via Video   Due to the COVID-19 pandemic, this visit was completed with telemedicine (audio/video) technology to reduce patient and provider exposure as well as to preserve personal protective equipment.   I connected with Hannah Vasquez by a video enabled telemedicine application and verified that I am speaking with the correct person using two identifiers. Location patient: Home Location provider: [facility], Office Persons participating in the virtual visit: [name], MD   I discussed the limitations of evaluation and management by telemedicine and the availability of in person appointments. The patient expressed understanding and agreed to proceed.  Interactive audio and video telecommunications were attempted between this provider and patient, however failed, due to patient having technical difficulties OR patient did not have access to video capability.  We continued and completed visit with audio only.  Care Team   Patient Care Team: Lucille Passy, MD as PCP - General (Family Medicine) Jerline Pain, MD as PCP - Cardiology (Cardiology) Jerline Pain, MD as Consulting Physician (Cardiology) Darleen Crocker, MD as Consulting Physician (Ophthalmology) Thelma Comp, [place] [facility] as Consulting Physician (Optometry) Griselda Miner, MD as Consulting Physician (Dermatology) Renee Pain, DDS as Consulting Physician (Dentistry)  Subjective:   HPI: Patient is C/O back pain deteriorated over the past two months.. Has had problems in the past with lumbar radiculopathy that also effects the lower extremities.   Followed by Dr. Margarito Liner.  She saw him last week for back injections (had been a year since previous injection) and gave her an rx for tizanidine.  MRI in 06/2018 showed progression of lower lumber disease and disc protrusion.  Gabapentin has helped tremendously and no longer makes her sleepy. She currently has rx for gabapentin 300 mg three times  daily as needed Also needs to refill tramadol.  Review of Systems  Constitutional: Negative for fever and malaise/fatigue.  HENT: Negative for congestion and hearing loss.   Eyes: Negative for blurred vision, discharge and redness.  Respiratory: Negative for cough and shortness of breath.   Cardiovascular: Negative for chest pain, palpitations and leg swelling.  Gastrointestinal: Negative for abdominal pain and heartburn.  Genitourinary: Negative for dysuria.  Musculoskeletal: Positive for back pain. Negative for falls.  Skin: Negative for rash.  Neurological: Positive for tingling. Negative for loss of consciousness and headaches.  Endo/Heme/Allergies: Does not bruise/bleed easily.  Psychiatric/Behavioral: Negative for depression.  All other systems reviewed and are negative.    Patient Active Problem List   Diagnosis Date Noted  . Chronic systolic heart failure ([place]) 07/11/2019  . Elevated serum alkaline phosphatase level 07/10/2019  . Leukocytosis 07/10/2019  . Bilateral hand pain 04/09/2019  . Bilateral hearing loss 04/09/2019  . Trigger finger, left middle finger 10/30/2018  . E. coli UTI (urinary tract infection) 09/05/2018  . Lumbar back pain with radiculopathy affecting lower extremity 05/22/2018  . Osteoporosis 05/15/2018  . Lumbar radiculopathy 04/27/2018  . GERD (gastroesophageal reflux disease) 04/10/2018  . Allergic reaction caused by a drug 11/02/2017  . DDD (degenerative disc disease), thoracolumbar 04/28/2017  . Back pain 04/21/2017  . Hypokalemia 01/06/2017  . NSTEMI (non-ST elevated myocardial infarction) ([place]) 01/01/2017  . Fatigue 11/27/2014  . RBBB 12/19/2013  . Mitral valve prolapse   . HYPERTENSION, BENIGN 08/27/2010  . Anxiety state 06/27/2009  . Coronary atherosclerosis 06/27/2009  . Mitral valve disorder 06/27/2009  . HEMORRHOIDS 06/27/2009  . Allergic rhinitis 06/27/2009  . Disorder of bone and cartilage 11/11/2006    Social  History    Tobacco Use  . Smoking status: Never Smoker  . Smokeless tobacco: Never Used  Substance Use Topics  . Alcohol use: No    Alcohol/week: 0.0 standard drinks    Current Outpatient Medications:  .  acetaminophen (TYLENOL) 500 MG tablet, Take 1 tablet (500 mg total) by mouth 2 (two) times daily., Disp: 60 tablet, Rfl: 0 .  albuterol (PROVENTIL HFA;VENTOLIN HFA) 108 (90 Base) MCG/ACT inhaler, Inhale 1 puff into the lungs every 6 (six) hours as needed for wheezing or shortness of breath., Disp: 1 Inhaler, Rfl: PRN .  amLODipine (NORVASC) 5 MG tablet, Take 1 tablet (5 mg total) by mouth daily., Disp: 90 tablet, Rfl: 3 .  aspirin EC 81 MG tablet, Take 81 mg by mouth every morning. , Disp: , Rfl:  .  atorvastatin (LIPITOR) 80 MG tablet, TAKE 1 TABLET BY MOUTH ONCE A DAY AT [place]., Disp: 90 tablet, Rfl: 3 .  Calcium-Magnesium-Vitamin D (CALCIUM 1200+D3 PO), Take 1,200 mg by mouth., Disp: , Rfl:  .  Cholecalciferol (VITAMIN D3) 1000 UNITS CAPS, Take 1 capsule by mouth every morning. , Disp: , Rfl:  .  diclofenac sodium (VOLTAREN) 1 % GEL, Apply 4 g topically 4 (four) times daily. To affected joint., Disp: 100 g, Rfl: 2 .  fluticasone (FLONASE) 50 MCG/ACT nasal spray, Place 1 spray into both nostrils as needed. , Disp: , Rfl:  .  furosemide (LASIX) 20 MG tablet, Take 1 tablet (20 mg total) by mouth daily as needed., Disp: 90 tablet, Rfl: 3 .  gabapentin (NEURONTIN) 300 MG capsule, Take 1 capsule (300 mg total) by mouth 3 (three) times daily., Disp: 90 capsule, Rfl: 3 .  hydrochlorothiazide (HYDRODIURIL) 25 MG tablet, Take 1 tablet (25 mg total) by mouth daily., Disp: 90 tablet, Rfl: 3 .  isosorbide mononitrate (IMDUR) 30 MG 24 hr tablet, Take 1 tablet (30 mg total) by mouth daily., Disp: 90 tablet, Rfl: 3 .  loratadine (CLARITIN) 10 MG tablet, Take 1 tablet (10 mg total) by mouth every morning., Disp: 30 tablet, Rfl: 2 .  losartan (COZAAR) 100 MG tablet, Take 1 tablet (100 mg total) by mouth  daily., Disp: 90 tablet, Rfl: 3 .  metoprolol succinate (TOPROL-XL) 25 MG 24 hr tablet, Take 1 tablet (25 mg total) by mouth daily., Disp: 90 tablet, Rfl: 3 .  Multiple Vitamin (MULTIVITAMIN) tablet, Take 1 tablet by mouth every morning. , Disp: , Rfl:  .  nitroGLYCERIN (NITROSTAT) 0.4 MG SL tablet, Place 1 tablet (0.4 mg total) under the tongue every 5 (five) minutes as needed for chest pain., Disp: 25 tablet, Rfl: 3 .  Omega-3 Fatty Acids (FISH OIL) 1200 MG CAPS, Take 1 capsule by mouth every morning. , Disp: , Rfl:  .  omeprazole (PRILOSEC) 40 MG capsule, Take 40 mg by mouth daily. , Disp: , Rfl:  .  potassium chloride SA (K-DUR) 20 MEQ tablet, Take 2 tablets (40 mEq total) by mouth 2 (two) times daily., Disp: 360 tablet, Rfl: 3 .  tiZANidine (ZANAFLEX) 4 MG tablet, Take 0.5-1 tablets (2-4 mg total) by mouth as needed., Disp: 30 tablet, Rfl: 1 .  traMADol (ULTRAM) 50 MG tablet, TAKE 1 TABLET BY MOUTH 4 TIMES DAILY AS NEEDED, Disp: 90 tablet, Rfl: 0 .  vitamin B-12 (CYANOCOBALAMIN) 100 MCG tablet, Take 1 tablet (100 mcg total) by mouth daily., Disp: , Rfl:  .  methylPREDNISolone (MEDROL DOSEPAK) 4 MG TBPK tablet, , Disp: , Rfl:  .  oxyCODONE (OXY IR/ROXICODONE) 5 MG immediate release tablet, Take 1 tablet (5 mg total) by mouth every 4 (four) hours as needed for severe pain. (Patient not taking: Reported on 09/06/2019), Disp: 30 tablet, Rfl: 0  Allergies  Allergen Reactions  . Bactroban [Mupirocin Calcium]     Facial redness and swelling  . Codeine     REACTION: nausea and vomiting  . Penicillins Nausea And Vomiting    REACTION: rash  . Valtrex [Valacyclovir Hcl] Swelling    Per pt causes redness    Objective:  BP (!) 147/77   Pulse 61   Ht 5' 2"  (1.575 m)   LMP  (LMP Unknown)   SpO2 99%   BMI 27.25 kg/m   VITALS: Per patient if applicable, see vitals. GENERAL: Alert, appears well and in no acute distress. HEENT: Atraumatic, conjunctiva clear, no obvious abnormalities on  inspection of external nose and ears. NECK: Normal movements of the head and neck. CARDIOPULMONARY: No increased WOB. Speaking in clear sentences. I:E ratio WNL.  MS: Moves all visible extremities without noticeable abnormality. PSYCH: Pleasant and cooperative, well-groomed. Speech normal rate and rhythm. Affect is appropriate. Insight and judgement are appropriate. Attention is focused, linear, and appropriate.  NEURO: CN grossly intact. Oriented as arrived to appointment on time with no prompting. Moves both UE equally.  SKIN: No obvious lesions, wounds, erythema, or cyanosis noted on face or hands.  Depression screen [facility] 2/9 09/06/2019 07/11/2019 04/25/2019  Decreased Interest 0 1 0  Down, Depressed, Hopeless - 1 0  PHQ - 2 Score 0 2 0  Altered sleeping - 3 -  Tired, decreased energy - 1 -  Change in appetite - 0 -  Feeling bad or failure about yourself  - 1 -  Trouble concentrating - 1 -  Moving slowly or fidgety/restless - 0 -  PHQ-9 Score - 8 -  Difficult doing work/chores - Somewhat difficult -  Some recent data might be hidden     . COVID-19 Education: The signs and symptoms of COVID-19 were discussed with the patient and how to seek care for testing if needed. The importance of social distancing was discussed today. . Reviewed expectations re: course of current medical issues. . Discussed self-management of symptoms. . Outlined signs and symptoms indicating need for more acute intervention. . Patient verbalized understanding and all questions were answered. Marland Kitchen Health Maintenance issues including appropriate healthy diet, exercise, and smoking avoidance were discussed with patient. . See orders for this visit as documented in the electronic medical record.  Arnette Norris, MD  Records requested if needed. Time spent:15  minutes, of which >50% was spent in obtaining information about her symptoms, reviewing her previous labs, evaluations, and treatments, counseling her about her  condition (please see the discussed topics above), and developing a plan to further investigate it; she had a number of questions which I addressed.   Lab Results  Component Value Date   WBC 10.3 07/23/2019   HGB 13.0 07/23/2019   HCT 40.1 07/23/2019   PLT 332.0 07/23/2019   GLUCOSE 111 (H) 07/23/2019   CHOL 116 07/02/2019   TRIG 123 07/02/2019   HDL 31 (L) 07/02/2019   LDLDIRECT 151.0 09/09/2011   LDLCALC 63 07/02/2019   ALT 20 07/23/2019   AST 23 07/23/2019   NA 143 07/23/2019   K 3.2 (L) 07/23/2019   CL 103 07/23/2019   CREATININE 0.89 07/23/2019   BUN 11 07/23/2019   CO2 30 07/23/2019   TSH 2.57 07/11/2019  INR 1.12 01/02/2017   HGBA1C 6.1 (H) 01/02/2017    Lab Results  Component Value Date   TSH 2.57 07/11/2019   Lab Results  Component Value Date   WBC 10.3 07/23/2019   HGB 13.0 07/23/2019   HCT 40.1 07/23/2019   MCV 83.4 07/23/2019   PLT 332.0 07/23/2019   Lab Results  Component Value Date   NA 143 07/23/2019   K 3.2 (L) 07/23/2019   CO2 30 07/23/2019   GLUCOSE 111 (H) 07/23/2019   BUN 11 07/23/2019   CREATININE 0.89 07/23/2019   BILITOT 0.8 07/23/2019   ALKPHOS 142 (H) 07/23/2019   AST 23 07/23/2019   ALT 20 07/23/2019   PROT 7.4 07/23/2019   ALBUMIN 4.2 07/23/2019   CALCIUM 9.9 07/23/2019   ANIONGAP 7 08/21/2017   GFR 62.56 07/23/2019   Lab Results  Component Value Date   CHOL 116 07/02/2019   Lab Results  Component Value Date   HDL 31 (L) 07/02/2019   Lab Results  Component Value Date   LDLCALC 63 07/02/2019   Lab Results  Component Value Date   TRIG 123 07/02/2019   Lab Results  Component Value Date   CHOLHDL 3.7 07/02/2019   Lab Results  Component Value Date   HGBA1C 6.1 (H) 01/02/2017       Assessment & Plan:   Problem List Items Addressed This Visit      Active Problems   Lumbar radiculopathy    Followed by Dr. Margarito Liner.  She saw him last week for back injections (had been a year since previous injection) and gave  her an rx for tizanidine.  She is going back to see Dr. Margarito Liner in 3 weeks.  She just wanted to update me.        Resolved Problems   RESOLVED: Dyspnea on exertion - Primary      I have discontinued [place]. Iser's ciprofloxacin. I am also having her maintain her Fish Oil, multivitamin, Vitamin D3, aspirin EC, loratadine, omeprazole, fluticasone, acetaminophen, oxyCODONE, Calcium-Magnesium-Vitamin D (CALCIUM 1200+D3 PO), losartan, vitamin B-12, albuterol, furosemide, potassium chloride SA, diclofenac sodium, nitroGLYCERIN, traMADol, tiZANidine, isosorbide mononitrate, hydrochlorothiazide, amLODipine, metoprolol succinate, gabapentin, atorvastatin, and methylPREDNISolone.  No orders of the defined types were placed in this encounter.    Arnette Norris, MD

## 2019-09-06 ENCOUNTER — Telehealth (INDEPENDENT_AMBULATORY_CARE_PROVIDER_SITE_OTHER): Payer: PPO | Admitting: Family Medicine

## 2019-09-06 ENCOUNTER — Telehealth: Payer: PPO | Admitting: Family Medicine

## 2019-09-06 ENCOUNTER — Encounter: Payer: Self-pay | Admitting: Family Medicine

## 2019-09-06 ENCOUNTER — Encounter (HOSPITAL_COMMUNITY): Payer: Self-pay

## 2019-09-06 VITALS — BP 147/77 | HR 61 | Ht 62.0 in

## 2019-09-06 DIAGNOSIS — M5416 Radiculopathy, lumbar region: Secondary | ICD-10-CM

## 2019-09-06 NOTE — Assessment & Plan Note (Signed)
Followed by Dr. Margarito Liner.  She saw him last week for back injections (had been a year since previous injection) and gave her an rx for tizanidine.  She is going back to see Dr. Margarito Liner in 3 weeks.  She just wanted to update me.

## 2019-09-11 ENCOUNTER — Encounter (HOSPITAL_COMMUNITY): Payer: Self-pay

## 2019-09-13 ENCOUNTER — Encounter (HOSPITAL_COMMUNITY): Payer: Self-pay

## 2019-09-17 ENCOUNTER — Other Ambulatory Visit: Payer: Self-pay | Admitting: Nurse Practitioner

## 2019-09-21 ENCOUNTER — Other Ambulatory Visit: Payer: Self-pay | Admitting: Family Medicine

## 2019-09-24 ENCOUNTER — Telehealth: Payer: Self-pay

## 2019-09-24 NOTE — Telephone Encounter (Signed)
TA-Plz see refill req for Tramadol/per [place] PMP pt is compliant without red flags/thx dmf

## 2019-09-24 NOTE — Telephone Encounter (Signed)
Patient is calling to check the status on her refill request for Tramadol. CB is [phone]

## 2019-09-25 ENCOUNTER — Ambulatory Visit: Payer: PPO | Admitting: Internal Medicine

## 2019-09-25 ENCOUNTER — Other Ambulatory Visit (INDEPENDENT_AMBULATORY_CARE_PROVIDER_SITE_OTHER): Payer: PPO

## 2019-09-25 ENCOUNTER — Encounter: Payer: Self-pay | Admitting: Internal Medicine

## 2019-09-25 VITALS — BP 136/72 | HR 74 | Temp 98.1°F | Ht 62.0 in | Wt 157.0 lb

## 2019-09-25 DIAGNOSIS — R748 Abnormal levels of other serum enzymes: Secondary | ICD-10-CM | POA: Diagnosis not present

## 2019-09-25 DIAGNOSIS — M47816 Spondylosis without myelopathy or radiculopathy, lumbar region: Secondary | ICD-10-CM | POA: Diagnosis not present

## 2019-09-25 DIAGNOSIS — M545 Low back pain: Secondary | ICD-10-CM | POA: Diagnosis not present

## 2019-09-25 LAB — HEPATIC FUNCTION PANEL
ALT: 22 U/L (ref 0–35)
AST: 19 U/L (ref 0–37)
Albumin: 4.2 g/dL (ref 3.5–5.2)
Alkaline Phosphatase: 138 U/L — ABNORMAL HIGH (ref 39–117)
Bilirubin, Direct: 0.1 mg/dL (ref 0.0–0.3)
Total Bilirubin: 0.5 mg/dL (ref 0.2–1.2)
Total Protein: 7.5 g/dL (ref 6.0–8.3)

## 2019-09-25 LAB — PROTIME-INR
INR: 1 ratio (ref 0.8–1.0)
Prothrombin Time: 11.2 s (ref 9.6–13.1)

## 2019-09-25 NOTE — Progress Notes (Signed)
Patient ID: Hannah Vasquez, female   DOB: Jun 13, 1951, 69 y.o.   MRN: [id number] HPI: Hannah Vasquez is a 69 year old female with a past medical history of CAD with history of prior MI in May 2018, hypertension, osteoporosis, history of anxiety who is seen in consultation at the request of Dr. Deborra Medina to evaluate elevated alkaline phosphatase.  She is here alone today.  Her prior GI care was with Dr. Olevia Perches years ago but she had an upper endoscopy and colonoscopy performed by Dr. Amedeo Plenty in 2018.  This colonoscopy was done for screening and was normal.  She has had an elevated alkaline phosphatase over the last 2+ years and she is sent over for this evaluation.  She denies abdominal pain.  No prior history of liver disease.  She does not use alcohol or tobacco.  She does have a family history notable for a brother with liver disease in his past which was felt to be alcohol related.  This was 20 years ago and he is not now known to have cirrhosis.  No jaundice, itching, ascites or lower extremity edema.  No bleeding.  She has regular bowel movements without blood in her stool or melena.  No recent medications which are new other than tizanidine which she uses as a muscle relaxer.   Past Medical History:  Diagnosis Date  . Allergy   . Anxiety   . CAD (coronary artery disease)   . Cataract   . Hemorrhoids   . History of MI (myocardial infarction) 10/07/2017  . Hypertension   . Mitral valve prolapse   . Osteoporosis   . Status post dilation of esophageal narrowing     Past Surgical History:  Procedure Laterality Date  . BASAL CELL CARCINOMA EXCISION  02/05/2016   Dr. Sarajane Jews, [facility]  . BLADDER SURGERY  11/15/1995   bladder neck suspension, urinary incontinence  . CARDIAC CATHETERIZATION  01/03/2017 DES LAD  . CATARACT EXTRACTION Left 04/30/2016   Dr. Tommy Rainwater, [place] N/A 01/03/2017   Procedure: Coronary Stent Intervention;  Surgeon: Nelva Bush,  MD;  Location: [facility];  Service: Cardiovascular;  Laterality: N/A;  . DENTAL SURGERY  06/20/2016   Tooth Implant; Dr. Kalman Shan  . INTRAVASCULAR ULTRASOUND/IVUS N/A 01/03/2017   Procedure: Intravascular Ultrasound/IVUS;  Surgeon: Nelva Bush, MD;  Location: [facility];  Service: Cardiovascular;  Laterality: N/A;  . KNEE SURGERY  2003   torn meniscus  . LEFT HEART CATH AND CORONARY ANGIOGRAPHY N/A 01/03/2017   Procedure: Left Heart Cath and Coronary Angiography;  Surgeon: Nelva Bush, MD;  Location: [place] [facility];  Service: Cardiovascular;  Laterality: N/A;  . MOHS SURGERY Right 2017   right side of face  . ROTATOR CUFF REPAIR    . SHOULDER SURGERY Right 2005    Outpatient Medications Prior to Visit  Medication Sig Dispense Refill  . acetaminophen (TYLENOL) 500 MG tablet Take 1 tablet (500 mg total) by mouth 2 (two) times daily. 60 tablet 0  . albuterol (PROVENTIL HFA;VENTOLIN HFA) 108 (90 Base) MCG/ACT inhaler Inhale 1 puff into the lungs every 6 (six) hours as needed for wheezing or shortness of breath. 1 Inhaler PRN  . amLODipine (NORVASC) 5 MG tablet Take 1 tablet (5 mg total) by mouth daily. 90 tablet 3  . aspirin EC 81 MG tablet Take 81 mg by mouth every morning.     Marland Kitchen atorvastatin (LIPITOR) 80 MG tablet TAKE 1 TABLET BY MOUTH ONCE A  DAY AT [place]. 90 tablet 3  . Calcium-Magnesium-Vitamin D (CALCIUM 1200+D3 PO) Take 1,200 mg by mouth.    . Cholecalciferol (VITAMIN D3) 1000 UNITS CAPS Take 1 capsule by mouth every morning.     . diclofenac sodium (VOLTAREN) 1 % GEL Apply 4 g topically 4 (four) times daily. To affected joint. 100 g 2  . fluticasone (FLONASE) 50 MCG/ACT nasal spray Place 1 spray into both nostrils as needed.     . furosemide (LASIX) 20 MG tablet Take 1 tablet (20 mg total) by mouth daily as needed. 90 tablet 3  . gabapentin (NEURONTIN) 300 MG capsule Take 1 capsule (300 mg total) by mouth 3 (three) times daily. 90 capsule 3  .  hydrochlorothiazide (HYDRODIURIL) 25 MG tablet Take 1 tablet (25 mg total) by mouth daily. 90 tablet 3  . isosorbide mononitrate (IMDUR) 30 MG 24 hr tablet Take 1 tablet (30 mg total) by mouth daily. 90 tablet 3  . loratadine (CLARITIN) 10 MG tablet Take 1 tablet (10 mg total) by mouth every morning. 30 tablet 2  . losartan (COZAAR) 100 MG tablet TAKE 1 TABLET BY MOUTH ONCE A DAY 90 tablet 3  . methylPREDNISolone (MEDROL DOSEPAK) 4 MG TBPK tablet     . metoprolol succinate (TOPROL-XL) 25 MG 24 hr tablet Take 1 tablet (25 mg total) by mouth daily. 90 tablet 3  . Multiple Vitamin (MULTIVITAMIN) tablet Take 1 tablet by mouth every morning.     . nitroGLYCERIN (NITROSTAT) 0.4 MG SL tablet Place 1 tablet (0.4 mg total) under the tongue every 5 (five) minutes as needed for chest pain. 25 tablet 3  . Omega-3 Fatty Acids (FISH OIL) 1200 MG CAPS Take 1 capsule by mouth every morning.     Marland Kitchen omeprazole (PRILOSEC) 40 MG capsule Take 40 mg by mouth daily.     . potassium chloride SA (K-DUR) 20 MEQ tablet Take 2 tablets (40 mEq total) by mouth 2 (two) times daily. 360 tablet 3  . tiZANidine (ZANAFLEX) 4 MG tablet Take 0.5-1 tablets (2-4 mg total) by mouth as needed. 30 tablet 1  . traMADol (ULTRAM) 50 MG tablet TAKE 1 TABLET BY MOUTH 4 TIMES DAILY AS NEEDED 90 tablet 5  . vitamin B-12 (CYANOCOBALAMIN) 100 MCG tablet Take 1 tablet (100 mcg total) by mouth daily.    Marland Kitchen oxyCODONE (OXY IR/ROXICODONE) 5 MG immediate release tablet Take 1 tablet (5 mg total) by mouth every 4 (four) hours as needed for severe pain. (Patient not taking: Reported on 09/06/2019) 30 tablet 0   No facility-administered medications prior to visit.    Allergies  Allergen Reactions  . Bactroban [Mupirocin Calcium]     Facial redness and swelling  . Codeine     REACTION: nausea and vomiting  . Penicillins Nausea And Vomiting    REACTION: rash  . Valtrex [Valacyclovir Hcl] Swelling    Per pt causes redness    Family History  Problem  Relation Age of Onset  . Mitral valve prolapse Mother   . Hypertension Mother   . Hypertension Father   . Brain cancer Father     Social History   Tobacco Use  . Smoking status: Never Smoker  . Smokeless tobacco: Never Used  Substance Use Topics  . Alcohol use: No    Alcohol/week: 0.0 standard drinks  . Drug use: No    ROS: As per history of present illness, otherwise negative  BP 136/72   Pulse 74   Temp  98.1 F (36.7 C)   Ht 5' 2"  (1.575 m)   Wt 157 lb (71.2 kg)   LMP  (LMP Unknown)   BMI 28.72 kg/m  Gen: awake, alert, NAD HEENT: anicteric CV: RRR, no mrg Pulm: CTA b/l Abd: soft, NT/ND, +BS throughout, no hepatosplenomegaly  Ext: no c/c/e Neuro: nonfocal   RELEVANT LABS AND IMAGING: CBC    Component Value Date/Time   WBC 10.3 07/23/2019 0800   RBC 4.81 07/23/2019 0800   HGB 13.0 07/23/2019 0800   HGB 12.5 07/02/2019 1104   HCT 40.1 07/23/2019 0800   HCT 37.2 07/02/2019 1104   PLT 332.0 07/23/2019 0800   PLT 366 07/02/2019 1104   MCV 83.4 07/23/2019 0800   MCV 79 07/02/2019 1104   MCH 26.5 (L) 07/02/2019 1104   MCH 26.0 08/21/2017 1127   MCHC 32.4 07/23/2019 0800   RDW 15.3 07/23/2019 0800   RDW 13.9 07/02/2019 1104   LYMPHSABS 3.0 07/23/2019 0800   MONOABS 0.7 07/23/2019 0800   EOSABS 0.2 07/23/2019 0800   BASOSABS 0.0 07/23/2019 0800    CMP     Component Value Date/Time   NA 143 07/23/2019 0800   NA 142 07/02/2019 1104   K 3.2 (L) 07/23/2019 0800   CL 103 07/23/2019 0800   CO2 30 07/23/2019 0800   GLUCOSE 111 (H) 07/23/2019 0800   BUN 11 07/23/2019 0800   BUN 11 07/02/2019 1104   CREATININE 0.89 07/23/2019 0800   CALCIUM 9.9 07/23/2019 0800   PROT 7.5 09/25/2019 1101   PROT 6.7 07/02/2019 1104   ALBUMIN 4.2 09/25/2019 1101   ALBUMIN 4.2 07/02/2019 1104   AST 19 09/25/2019 1101   ALT 22 09/25/2019 1101   ALKPHOS 138 (H) 09/25/2019 1101   BILITOT 0.5 09/25/2019 1101   BILITOT 0.8 07/02/2019 1104   GFRNONAA 61 07/02/2019 1104    GFRAA 70 07/02/2019 1104   CLINICAL DATA:  Persistently elevated alkaline phosphatase.   EXAM: ULTRASOUND ABDOMEN LIMITED RIGHT UPPER QUADRANT   COMPARISON:  Chest CTA dated 12/01/2012.   FINDINGS: Gallbladder:   No gallstones or wall thickening visualized. No sonographic Murphy sign noted by sonographer.   Common bile duct:   Diameter: 2.6 mm   Liver:   Diffusely echogenic and mildly heterogeneous. No corresponding abnormality on the previous chest CTA in 2014. portal vein is patent on color Doppler imaging with normal direction of blood flow towards the liver.   Other: None.   IMPRESSION: 1. Diffusely echogenic and mildly heterogeneous liver. This is most likely due to steatosis. Chronic hepatitis and cirrhosis can also produce this appearance. 2. Otherwise, normal examination with no gallstones visualized.     Electronically Signed   By: Claudie Revering M.D.   On: 07/27/2019 11:01  Acute hepatitis panel was negative Hepatitis B core antibody and surface antigen negative, hepatitis B surface antibody negative Hepatitis C negative Hep A ab positive GGT normal 27  ASSESSMENT/PLAN: 69 year old female with a past medical history of CAD with history of prior MI in May 2018, hypertension, osteoporosis, history of anxiety who is seen in consultation at the request of Dr. Deborra Medina to evaluate elevated alkaline phosphatase.   1. Isolated elevated alkaline phosphatase --her alkaline phosphatase has been persistent but very mildly elevated since her myocardial infarction in May 2018.  Since then alkaline phosphatase has been persistently elevated however before this was normal.  This raises the question of a medication related elevation.  She is not known to have CHF.  I  have recommended some additional lab testing to rule out other hepatic causes though her AST, ALT bilirubin and GGT were normal.  We can follow alkaline phosphatase over time as well. --Check hepatic function panel  today, INR, AMA, anti-smooth muscle antibody, ANA, IgG and a fractionated alkaline phosphatase --No evidence for advanced or decompensated liver disease --Liver was echogenic by ultrasound but no evidence for cirrhosis.  This likely represents an element of fatty liver disease though her liver enzymes are normal --Further recommendations after lab tests are reviewed --65-monthfollow-up to repeat  2.  CRC screening --normal colonoscopy in 2018, consider repeat in 2028      CKM:KTLZ TMarciano Sequin [place]inton[place]ville[place]  [place] [id number]

## 2019-09-25 NOTE — Patient Instructions (Signed)
If you are age 71 or older, your body mass index should be between 23-30. Your Body mass index is 28.72 kg/m. If this is out of the aforementioned range listed, please consider follow up with your Primary Care Provider.  If you are age 53 or younger, your body mass index should be between 19-25. Your Body mass index is 28.72 kg/m. If this is out of the aformentioned range listed, please consider follow up with your Primary Care Provider.   Due to recent changes in healthcare laws, you may see the results of your imaging and laboratory studies on MyChart before your provider has had a chance to review them.  We understand that in some cases there may be results that are confusing or concerning to you. Not all laboratory results come back in the same time frame and the provider may be waiting for multiple results in order to interpret others.  Please give [place] 48 hours in order for your provider to thoroughly review all the results before contacting the office for clarification of your results.   Your provider has requested that you go to the basement level for lab work before leaving today. Press "B" on the elevator. The lab is located at the first door on the left as you exit the elevator.

## 2019-09-26 LAB — SPECIMEN STATUS REPORT

## 2019-09-26 LAB — ANA: Anti Nuclear Antibody (ANA): NEGATIVE

## 2019-09-27 LAB — ALKALINE PHOSPHATASE, ISOENZYMES
Alkaline Phosphatase: 163 IU/L — ABNORMAL HIGH (ref 39–117)
BONE FRACTION: 21 % (ref 14–68)
INTESTINAL FRAC.: 7 % (ref 0–18)
LIVER FRACTION: 72 % (ref 18–85)

## 2019-09-27 LAB — ANTI-SMOOTH MUSCLE ANTIBODY, IGG: Smooth Muscle Ab: 6 Units (ref 0–19)

## 2019-09-27 LAB — MITOCHONDRIAL ANTIBODIES: Mitochondrial Ab: <20 Units (ref 0.0–20.0)

## 2019-09-27 LAB — IGG: IgG (Immunoglobin G), Serum: 1054 mg/dL (ref 586–1602)

## 2019-09-28 ENCOUNTER — Other Ambulatory Visit: Payer: Self-pay

## 2019-09-28 DIAGNOSIS — R748 Abnormal levels of other serum enzymes: Secondary | ICD-10-CM

## 2019-10-01 DIAGNOSIS — M65311 Trigger thumb, right thumb: Secondary | ICD-10-CM | POA: Diagnosis not present

## 2019-10-01 DIAGNOSIS — M65332 Trigger finger, left middle finger: Secondary | ICD-10-CM | POA: Diagnosis not present

## 2019-10-01 NOTE — Telephone Encounter (Signed)
error 

## 2019-10-05 DIAGNOSIS — H903 Sensorineural hearing loss, bilateral: Secondary | ICD-10-CM | POA: Diagnosis not present

## 2019-10-09 ENCOUNTER — Ambulatory Visit (INDEPENDENT_AMBULATORY_CARE_PROVIDER_SITE_OTHER): Payer: PPO | Admitting: Family Medicine

## 2019-10-09 ENCOUNTER — Encounter: Payer: Self-pay | Admitting: Family Medicine

## 2019-10-09 ENCOUNTER — Other Ambulatory Visit: Payer: Self-pay

## 2019-10-09 VITALS — BP 122/60 | HR 83 | Temp 97.3°F | Ht 62.0 in | Wt 159.0 lb

## 2019-10-09 DIAGNOSIS — R748 Abnormal levels of other serum enzymes: Secondary | ICD-10-CM

## 2019-10-09 DIAGNOSIS — I214 Non-ST elevation (NSTEMI) myocardial infarction: Secondary | ICD-10-CM

## 2019-10-09 DIAGNOSIS — I10 Essential (primary) hypertension: Secondary | ICD-10-CM | POA: Diagnosis not present

## 2019-10-09 DIAGNOSIS — M5416 Radiculopathy, lumbar region: Secondary | ICD-10-CM

## 2019-10-09 NOTE — Assessment & Plan Note (Signed)
Follows with cardiology. BP at goal. On statin and ASA. No symptoms today

## 2019-10-09 NOTE — Assessment & Plan Note (Signed)
Reviewed GI note. Other labs otherwise normal. Suspect likely Fatty liver disease. Continue to follow labs.

## 2019-10-09 NOTE — Assessment & Plan Note (Signed)
Follows with Dr. Fredna Dow and does well with injections with one coming up soon. Takes Tramadol prn and gabapentin prn between injections. Last fill of tramadol 09/24/2019 #90. Previous fill was about 2 months earlier. Return for contract and refill.

## 2019-10-09 NOTE — Patient Instructions (Addendum)
Great to meet you today!  Return when next due for Tramadol refill so we can do a contract and discuss this medication    # Weight loss We talked about a plan to focus on healthy eating -- you are planning to have more healthy foods on hand

## 2019-10-09 NOTE — Progress Notes (Signed)
Subjective:     Hannah Vasquez is a 69 y.o. female presenting for New Patient (Initial Visit) (Transfer from Dr Deborra Medina - no concerns)     HPI  #Hx of MI - follows with cardiology  #chronic back pain - sees Dr. Butch Penny at [facility] - planning to get some back injections in march - had one last year and it was doing well for a 10 months - did get some hip injections w/o improvement - taking the tramadol prn - will take 1 a day but as pain returns will go up to 4 per day - tried tizanidine which makes her very sleepy  #weight gain - has gained about 10 lbs in the last few months - has not been eating great - elevated liver enzymes suspected fatty liver  Review of Systems   Social History   Tobacco Use  Smoking Status Never Smoker  Smokeless Tobacco Never Used        Objective:    BP Readings from Last 3 Encounters:  10/09/19 122/60  09/25/19 136/72  09/06/19 (!) 147/77   Wt Readings from Last 3 Encounters:  10/09/19 159 lb (72.1 kg)  09/25/19 157 lb (71.2 kg)  07/11/19 149 lb (67.6 kg)    BP 122/60 (BP Location: Left Arm, Patient Position: Sitting, Cuff Size: Normal)   Pulse 83   Temp (!) 97.3 F (36.3 C) (Temporal)   Ht 5' 2"  (1.575 m)   Wt 159 lb (72.1 kg)   LMP  (LMP Unknown)   SpO2 99%   BMI 29.08 kg/m    Physical Exam Constitutional:      General: She is not in acute distress.    Appearance: She is well-developed. She is not diaphoretic.  HENT:     Right Ear: External ear normal.     Left Ear: External ear normal.     Nose: Nose normal.  Eyes:     Conjunctiva/sclera: Conjunctivae normal.  Cardiovascular:     Rate and Rhythm: Normal rate and regular rhythm.     Heart sounds: Murmur present.  Pulmonary:     Effort: Pulmonary effort is normal. No respiratory distress.     Breath sounds: Normal breath sounds. No wheezing.  Musculoskeletal:     Cervical back: Neck supple.  Skin:    General: Skin is warm and dry.     Capillary  Refill: Capillary refill takes less than 2 seconds.  Neurological:     Mental Status: She is alert. Mental status is at baseline.  Psychiatric:        Mood and Affect: Mood normal.        Behavior: Behavior normal.           Assessment & Plan:   Problem List Items Addressed This Visit      Cardiovascular and Mediastinum   HYPERTENSION, BENIGN   NSTEMI (non-ST elevated myocardial infarction) ([place]) - Primary    Follows with cardiology. BP at goal. On statin and ASA. No symptoms today        Nervous and Auditory   Lumbar back pain with radiculopathy affecting lower extremity    Follows with Dr. Fredna Dow and does well with injections with one coming up soon. Takes Tramadol prn and gabapentin prn between injections. Last fill of tramadol 09/24/2019 #90. Previous fill was about 2 months earlier. Return for contract and refill.         Other   Elevated serum alkaline phosphatase level    Reviewed  GI note. Other labs otherwise normal. Suspect likely Fatty liver disease. Continue to follow labs.           Return in about 2 months (around 12/07/2019).  Lesleigh Noe, MD

## 2019-10-24 DIAGNOSIS — M545 Low back pain: Secondary | ICD-10-CM | POA: Diagnosis not present

## 2019-10-24 DIAGNOSIS — M47816 Spondylosis without myelopathy or radiculopathy, lumbar region: Secondary | ICD-10-CM | POA: Diagnosis not present

## 2019-10-25 ENCOUNTER — Telehealth: Payer: Self-pay | Admitting: Family Medicine

## 2019-10-25 NOTE — Telephone Encounter (Signed)
Noted will look for records.

## 2019-10-25 NOTE — Telephone Encounter (Signed)
Patient saw Dr Laroy Apple yesterday. Patient had 4 injections in her spine. Patient said Dr. Ron Agee will send a note to Dr.Cody.

## 2019-11-09 ENCOUNTER — Encounter: Payer: Self-pay | Admitting: Cardiology

## 2019-11-09 ENCOUNTER — Other Ambulatory Visit: Payer: Self-pay

## 2019-11-09 ENCOUNTER — Ambulatory Visit: Payer: PPO | Admitting: Cardiology

## 2019-11-09 VITALS — BP 130/74 | HR 99 | Ht 62.0 in | Wt 160.8 lb

## 2019-11-09 DIAGNOSIS — Z79899 Other long term (current) drug therapy: Secondary | ICD-10-CM | POA: Diagnosis not present

## 2019-11-09 DIAGNOSIS — I2510 Atherosclerotic heart disease of native coronary artery without angina pectoris: Secondary | ICD-10-CM | POA: Diagnosis not present

## 2019-11-09 DIAGNOSIS — E7800 Pure hypercholesterolemia, unspecified: Secondary | ICD-10-CM

## 2019-11-09 DIAGNOSIS — I10 Essential (primary) hypertension: Secondary | ICD-10-CM | POA: Diagnosis not present

## 2019-11-09 LAB — BASIC METABOLIC PANEL
BUN/Creatinine Ratio: 11 — ABNORMAL LOW (ref 12–28)
BUN: 11 mg/dL (ref 8–27)
CO2: 24 mmol/L (ref 20–29)
Calcium: 9.2 mg/dL (ref 8.7–10.3)
Chloride: 102 mmol/L (ref 96–106)
Creatinine, Ser: 0.98 mg/dL (ref 0.57–1.00)
GFR calc Af Amer: 67 mL/min/{1.73_m2} (ref >59)
GFR calc non Af Amer: 58 mL/min/{1.73_m2} — ABNORMAL LOW (ref >59)
Glucose: 113 mg/dL — ABNORMAL HIGH (ref 65–99)
Potassium: 3.5 mmol/L (ref 3.5–5.2)
Sodium: 141 mmol/L (ref 134–144)

## 2019-11-09 LAB — LIPID PANEL
Chol/HDL Ratio: 3.8 ratio (ref 0.0–4.4)
Cholesterol, Total: 115 mg/dL (ref 100–199)
HDL: 30 mg/dL — ABNORMAL LOW (ref >39)
LDL Chol Calc (NIH): 63 mg/dL (ref 0–99)
Triglycerides: 123 mg/dL (ref 0–149)
VLDL Cholesterol Cal: 22 mg/dL (ref 5–40)

## 2019-11-09 NOTE — Progress Notes (Signed)
Cardiology Office Note:    Date:  11/09/2019   ID:  Hannah Vasquez, DOB December 02, 1950, MRN [id number]  PCP:  Hannah Noe, MD  Cardiologist:  Hannah Furbish, MD  Electrophysiologist:  None   Referring MD: Hannah Noe, MD     History of Present Illness:    Hannah Vasquez is a 69 y.o. female here for the follow-up of coronary artery disease status post STEMI in May 2018 2 stents to the LAD EF 40% at that time.  Now ischemic cardiomyopathy has resolved.  EF normal.  Previously has had a lot of issues with chronic fatigue. Back pain, arthritis.   2 injections to back.   Encouraged COVID vaccine.   Encouraged her to go to the [facility] when applicable 5 to 10 minutes from her house and begin to exercise.  She asked about leg pain, excellent pulses.  Not PVD.    Past Medical History:  Diagnosis Date  . Allergy   . Anxiety   . CAD (coronary artery disease)   . Cataract   . Does use hearing aid    bilateral as of 10/05/2019  . Hemorrhoids   . History of MI (myocardial infarction) 10/07/2017  . Hypertension   . Mitral valve prolapse   . Osteoporosis   . Status post dilation of esophageal narrowing     Past Surgical History:  Procedure Laterality Date  . BASAL CELL CARCINOMA EXCISION  02/05/2016   Dr. Sarajane Jews, [facility]  . BLADDER SURGERY  11/15/1995   bladder neck suspension, urinary incontinence  . CARDIAC CATHETERIZATION  01/03/2017 DES LAD  . CATARACT EXTRACTION Left 04/30/2016   Dr. Tommy Rainwater, [place] N/A 01/03/2017   Procedure: Coronary Stent Intervention;  Surgeon: Nelva Bush, MD;  Location: [facility];  Service: Cardiovascular;  Laterality: N/A;  . DENTAL SURGERY  06/20/2016   Tooth Implant; Dr. Kalman Shan  . INTRAVASCULAR ULTRASOUND/IVUS N/A 01/03/2017   Procedure: Intravascular Ultrasound/IVUS;  Surgeon: Nelva Bush, MD;  Location: [facility];  Service: Cardiovascular;  Laterality: N/A;  . KNEE  SURGERY  2003   torn meniscus  . LEFT HEART CATH AND CORONARY ANGIOGRAPHY N/A 01/03/2017   Procedure: Left Heart Cath and Coronary Angiography;  Surgeon: Nelva Bush, MD;  Location: [facility];  Service: Cardiovascular;  Laterality: N/A;  . MOHS SURGERY Right 2017   right side of face  . ROTATOR CUFF REPAIR    . SHOULDER SURGERY Right 2005    Current Medications: Current Meds  Medication Sig  . acetaminophen (TYLENOL) 500 MG tablet Take 1 tablet (500 mg total) by mouth 2 (two) times daily.  Marland Kitchen albuterol (PROVENTIL HFA;VENTOLIN HFA) 108 (90 Base) MCG/ACT inhaler Inhale 1 puff into the lungs every 6 (six) hours as needed for wheezing or shortness of breath.  Marland Kitchen amLODipine (NORVASC) 5 MG tablet Take 1 tablet (5 mg total) by mouth daily.  Marland Kitchen aspirin EC 81 MG tablet Take 81 mg by mouth every morning.   Marland Kitchen atorvastatin (LIPITOR) 80 MG tablet TAKE 1 TABLET BY MOUTH ONCE A DAY AT [facility] THE EVENING.  . Calcium-Magnesium-Vitamin D (CALCIUM 1200+D3 PO) Take 1,200 mg by mouth.  . Cholecalciferol (VITAMIN D3) 1000 UNITS CAPS Take 1 capsule by mouth every morning.   . diclofenac sodium (VOLTAREN) 1 % GEL Apply 4 g topically 4 (four) times daily. To affected joint.  . fluticasone (FLONASE) 50 MCG/ACT nasal spray Place 1 spray into both nostrils as  needed.   . furosemide (LASIX) 20 MG tablet Take 1 tablet (20 mg total) by mouth daily as needed.  . gabapentin (NEURONTIN) 300 MG capsule Take 1 capsule (300 mg total) by mouth 3 (three) times daily.  . hydrochlorothiazide (HYDRODIURIL) 25 MG tablet Take 1 tablet (25 mg total) by mouth daily.  . isosorbide mononitrate (IMDUR) 30 MG 24 hr tablet Take 1 tablet (30 mg total) by mouth daily.  Marland Kitchen loratadine (CLARITIN) 10 MG tablet Take 1 tablet (10 mg total) by mouth every morning.  Marland Kitchen losartan (COZAAR) 100 MG tablet TAKE 1 TABLET BY MOUTH ONCE A DAY  . metoprolol succinate (TOPROL-XL) 25 MG 24 hr tablet Take 1 tablet (25 mg total) by mouth daily.  .  Multiple Vitamin (MULTIVITAMIN) tablet Take 1 tablet by mouth every morning.   . nitroGLYCERIN (NITROSTAT) 0.4 MG SL tablet Place 1 tablet (0.4 mg total) under the tongue every 5 (five) minutes as needed for chest pain.  . Omega-3 Fatty Acids (FISH OIL) 1200 MG CAPS Take 1 capsule by mouth every morning.   Marland Kitchen omeprazole (PRILOSEC) 40 MG capsule Take 40 mg by mouth daily.   . potassium chloride SA (K-DUR) 20 MEQ tablet Take 2 tablets (40 mEq total) by mouth 2 (two) times daily.  Marland Kitchen tiZANidine (ZANAFLEX) 4 MG tablet Take 0.5-1 tablets (2-4 mg total) by mouth as needed.  . traMADol (ULTRAM) 50 MG tablet TAKE 1 TABLET BY MOUTH 4 TIMES DAILY AS NEEDED  . vitamin B-12 (CYANOCOBALAMIN) 100 MCG tablet Take 1 tablet (100 mcg total) by mouth daily.     Allergies:   Bactroban [mupirocin calcium], Codeine, Penicillins, and Valtrex [valacyclovir hcl]   Social History   Socioeconomic History  . Marital status: Divorced    Spouse name: Not on file  . Number of children: 2  . Years of education: high school  . Highest education level: Not on file  Occupational History  . Occupation: Medical office---front desk    Comment: Retired  Tobacco Use  . Smoking status: Never Smoker  . Smokeless tobacco: Never Used  Substance and Sexual Activity  . Alcohol use: No    Alcohol/week: 0.0 standard drinks  . Drug use: No  . Sexual activity: Not Currently    Birth control/protection: Post-menopausal  Other Topics Concern  . 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 Difficulty of Paying Living Expenses:   Food Insecurity:   . Worried About Charity fundraiser in the Last Year:   . Arboriculturist in the Last Year:   Transportation Needs:   . Film/video editor (Medical):   Marland Kitchen Lack of Transportation (Non-Medical):   Physical Activity:   . Days of Exercise per Week:   . Minutes of Exercise per Session:   Stress:   .  Feeling of Stress :   Social Connections:   . Frequency of Communication with Friends and Family:   . Frequency of Social Gatherings with Friends and Family:   . Attends Religious Services:   . Active Member of Clubs or Organizations:   . Attends Archivist Meetings:   Marland Kitchen Marital Status:      Family History: The patient's family history includes Brain cancer in her father; Hypertension in her father and mother; Mitral valve prolapse in her mother.  ROS:   Please see the history of present illness.     All other systems reviewed and are negative.  EKGs/Labs/Other Studies Reviewed:    The following studies were reviewed today:  CPX 12/2017 Conclusion: The interpretation of this test is limited due to submaximal effort during the exercise. Based on available data, exercise testing with gas exchange does not indicate cardiopulmonary limitation. There was an early and significant hypertensive response to exercise, with elevated VE/VCO2 slope.    Test, report and preliminary impression by: Landis Martins, MS, ACSM-RCEP 12/15/2017 3:42 PM  Exercise testing stopped early due to severe HTN response to exercise and patient's imbalance. Available data shows a normal pVO2 when compared to matched subjects. The VE/VCO2 slope (a surrogate for dead space ventilation and increased pulmonary pressures) was elevated and likely represents diastolic dysfunction in the setting of a severe HTN response to exercise. No other cardiopulmonary abnormality was noted.   Glori Bickers,  MD  3:07 PM   Cath 01/03/17 Conclusions: 1. Significant 2-vessel coronary artery disease, including diffuse LAD disease with up to 99% stenosis in the mid portion and TIMI-2 flow, as well as 90% small ramus lesion and 40% proximal/mid LCx disease. 2. Moderately reduced LV contraction (LVEF 35-45%) with anterior and apical akinesis. 3. Moderately elevated left ventricular filling pressure. 4. Successful IVUS-guided PCI to the mid and distal LAD with placement of 2 overlapping Resolute Onyx drug-eluting stents with 0% residual stenosis and TIMI-3 flow.  Recommendations: 1. Dual antiplatelet therapy with aspirin and ticagrelor for at least 12 months, ideally longer. 2. Aggressive secondary prevention. 3. Medical therapy for small ramus intermedius and jailed diagonal branch, as PCI would be challenging given small vessel size. 4. Diuresis and titration of evidence-based heart failure therapy, given moderately reduced LV contraction with elevated filling pressure.   ECHO 02/2017 - EF 65%   Echocardiogram 01/03/17: -EF 45%   EKG:  EKG is not ordered today.  The ekg ordered today demonstrates prior shows normal sinus rhythm right bundle branch block with heart rate of 66  Recent Labs: 07/11/2019: TSH 2.57 07/23/2019: BUN 11; Creatinine, Ser 0.89; Hemoglobin 13.0; Platelets 332.0; Potassium 3.2; Sodium 143 09/25/2019: ALT 22  Recent Lipid Panel    Component Value Date/Time   CHOL 116 07/02/2019 1104   TRIG 123 07/02/2019 1104   HDL 31 (L) 07/02/2019 1104   CHOLHDL 3.7 07/02/2019 1104   CHOLHDL 6.3 01/02/2017 0144   VLDL 39 01/02/2017 0144   LDLCALC 63 07/02/2019 1104   LDLDIRECT 151.0 09/09/2011 0919    Physical Exam:    VS:  BP 130/74   Pulse 99   Ht 5' 2"  (1.575 m)   Wt 160 lb 12.8 oz (72.9 kg)   LMP  (LMP Unknown)   SpO2 97%   BMI 29.41 kg/m     Wt Readings from Last 3 Encounters:  11/09/19 160 lb 12.8 oz (72.9 kg)  10/09/19 159 lb (72.1 kg)  09/25/19 157 lb  (71.2 kg)  GEN:  Well nourished, well developed in no acute distress HEENT: Normal NECK: No JVD; No carotid bruits LYMPHATICS: No lymphadenopathy CARDIAC: RRR, no murmurs, rubs, gallops RESPIRATORY:  Clear to auscultation without rales, wheezing or rhonchi  ABDOMEN: Soft, non-tender, non-distended MUSCULOSKELETAL:  No edema; No deformity  SKIN: Warm and dry NEUROLOGIC:  Alert and oriented x 3 PSYCHIATRIC:  Normal affect   ASSESSMENT:    1. Coronary artery disease involving native coronary artery of native heart without angina pectoris   2. Essential hypertension   3. Pure hypercholesterolemia   4. Long-term use of high-risk medication    PLAN:    In order of problems listed above:  Coronary artery disease status post MI in 2018 -LAD stents, jailed diagonal.  Now on aspirin.  Overall been doing quite well.  See studies above.  Continue with aggressive risk factor modification.  Hyperlipidemia -On high intensity statin therapy, LDL has improved to 54 less than 70, repeating lipid panel.  Essential hypertension -Overall reasonably controlled.  Norvasc isosorbide losartan Toprol  Hypokalemia -She is taking potassium supplement.  We are checking be met again today.  HCTZ may be exacerbating this.   Medication Adjustments/Labs and Tests Ordered: Current medicines are reviewed at length with the patient today.  Concerns regarding medicines are outlined above.  Orders Placed This Encounter  Procedures  . Basic metabolic panel  . Lipid panel   No orders of the defined types were placed in this encounter.   Patient Instructions  Medication Instructions:  The current medical regimen is effective;  continue present plan and medications.  *If you need a refill on your cardiac medications before your next appointment, please call your pharmacy*  Lab Work: Please have blood work today (BMP, Lipid)  If you have labs (blood work) drawn today and your tests are completely  normal, you will receive your results only by: Marland Kitchen MyChart Message (if you have MyChart) OR . A paper copy in the mail If you have any lab test that is abnormal or we need to change your treatment, we will call you to review the results.  Follow-Up: At [facility], you and your health needs are our priority.  As part of our continuing mission to provide you with exceptional heart care, we have created designated Provider Care Teams.  These Care Teams include your primary Cardiologist (physician) and Advanced Practice Providers (APPs -  Physician Assistants and Nurse Practitioners) who all work together to provide you with the care you need, when you need it.  We recommend signing up for the patient portal called "MyChart".  Sign up information is provided on this After Visit Summary.  MyChart is used to connect with patients for Virtual Visits (Telemedicine).  Patients are able to view lab/test results, encounter notes, upcoming appointments, etc.  Non-urgent messages can be sent to your provider as well.   To learn more about what you can do with MyChart, go to [email or web address].    Your next appointment:   6 month(s)  The format for your next appointment:   In Person  Provider:   Truitt Merle, NP and 1 year with Dr Marlou Porch.   Thank you for choosing [facility]!!        Signed, Hannah Furbish, MD  11/09/2019 9:31 AM    [place]

## 2019-11-09 NOTE — Patient Instructions (Signed)
Medication Instructions:  The current medical regimen is effective;  continue present plan and medications.  *If you need a refill on your cardiac medications before your next appointment, please call your pharmacy*  Lab Work: Please have blood work today (BMP, Lipid)  If you have labs (blood work) drawn today and your tests are completely normal, you will receive your results only by: Marland Kitchen MyChart Message (if you have MyChart) OR . A paper copy in the mail If you have any lab test that is abnormal or we need to change your treatment, we will call you to review the results.  Follow-Up: At [facility], you and your health needs are our priority.  As part of our continuing mission to provide you with exceptional heart care, we have created designated Provider Care Teams.  These Care Teams include your primary Cardiologist (physician) and Advanced Practice Providers (APPs -  Physician Assistants and Nurse Practitioners) who all work together to provide you with the care you need, when you need it.  We recommend signing up for the patient portal called "MyChart".  Sign up information is provided on this After Visit Summary.  MyChart is used to connect with patients for Virtual Visits (Telemedicine).  Patients are able to view lab/test results, encounter notes, upcoming appointments, etc.  Non-urgent messages can be sent to your provider as well.   To learn more about what you can do with MyChart, go to [email or web address].    Your next appointment:   6 month(s)  The format for your next appointment:   In Person  Provider:   Truitt Merle, NP and 1 year with Dr Marlou Porch.   Thank you for choosing [facility]!!

## 2019-11-12 ENCOUNTER — Telehealth: Payer: Self-pay | Admitting: Cardiology

## 2019-11-12 NOTE — Telephone Encounter (Signed)
New Message    Pt is returning call for results   Please call back

## 2019-11-12 NOTE — Telephone Encounter (Signed)
Returned call to pt and she has been made aware of her lab results and to continue the K+.

## 2019-11-20 DIAGNOSIS — Z1231 Encounter for screening mammogram for malignant neoplasm of breast: Secondary | ICD-10-CM | POA: Diagnosis not present

## 2019-11-20 LAB — HM MAMMOGRAPHY

## 2019-11-22 ENCOUNTER — Other Ambulatory Visit: Payer: Self-pay | Admitting: Physical Medicine and Rehabilitation

## 2019-11-22 ENCOUNTER — Encounter: Payer: Self-pay | Admitting: Family Medicine

## 2019-11-22 DIAGNOSIS — M47816 Spondylosis without myelopathy or radiculopathy, lumbar region: Secondary | ICD-10-CM | POA: Diagnosis not present

## 2019-11-22 DIAGNOSIS — M545 Low back pain, unspecified: Secondary | ICD-10-CM

## 2019-12-04 ENCOUNTER — Ambulatory Visit: Payer: PPO | Admitting: Nurse Practitioner

## 2019-12-06 ENCOUNTER — Telehealth (HOSPITAL_COMMUNITY): Payer: Self-pay | Admitting: Family Medicine

## 2019-12-12 ENCOUNTER — Encounter: Payer: Self-pay | Admitting: Family Medicine

## 2019-12-12 ENCOUNTER — Ambulatory Visit (INDEPENDENT_AMBULATORY_CARE_PROVIDER_SITE_OTHER): Payer: PPO | Admitting: Family Medicine

## 2019-12-12 ENCOUNTER — Ambulatory Visit: Payer: PPO | Admitting: Emergency Medicine

## 2019-12-12 ENCOUNTER — Other Ambulatory Visit: Payer: Self-pay

## 2019-12-12 VITALS — BP 140/78 | HR 92 | Temp 97.2°F | Ht 62.0 in | Wt 160.5 lb

## 2019-12-12 DIAGNOSIS — M5416 Radiculopathy, lumbar region: Secondary | ICD-10-CM | POA: Diagnosis not present

## 2019-12-12 DIAGNOSIS — J4520 Mild intermittent asthma, uncomplicated: Secondary | ICD-10-CM | POA: Diagnosis not present

## 2019-12-12 DIAGNOSIS — J45909 Unspecified asthma, uncomplicated: Secondary | ICD-10-CM | POA: Insufficient documentation

## 2019-12-12 DIAGNOSIS — G629 Polyneuropathy, unspecified: Secondary | ICD-10-CM | POA: Insufficient documentation

## 2019-12-12 MED ORDER — GABAPENTIN 300 MG PO CAPS: 300.0000 mg | ORAL_CAPSULE | Freq: Three times a day (TID) | ORAL | 1 refills | Status: DC

## 2019-12-12 MED ORDER — TRAMADOL HCL 50 MG PO TABS: 50.0000 mg | ORAL_TABLET | Freq: Four times a day (QID) | ORAL | 1 refills | Status: DC | PRN

## 2019-12-12 MED ORDER — ALBUTEROL SULFATE HFA 108 (90 BASE) MCG/ACT IN AERS: 1.0000 | INHALATION_SPRAY | Freq: Four times a day (QID) | RESPIRATORY_TRACT | 1 refills | Status: AC | PRN

## 2019-12-12 NOTE — Progress Notes (Signed)
Subjective:     Hannah Vasquez is a 69 y.o. female presenting for Back Pain (follow up on Tramadol)     HPI   #Chronic back pain - has an MRI on Monday - seeing a NSG - has a lot of arthritis pain - has had injections a few months ago w/o success - prior injections last a year w/o issues - back has been bad the last 2-3 weeks - a few months ago was taking 1 pill and sometimes 3 - but the last few weeks has been taking 4 daily - unable to view PDMP - endorses some abdominal pain - was also taking prednisone  #Neuropathy - b/l feet - is getting breakthrough symptoms - though the gabapentin helps  Review of Systems   Social History   Tobacco Use  Smoking Status Never Smoker  Smokeless Tobacco Never Used        Objective:    BP Readings from Last 3 Encounters:  12/12/19 140/78  11/09/19 130/74  10/09/19 122/60   Wt Readings from Last 3 Encounters:  12/12/19 160 lb 8 oz (72.8 kg)  11/09/19 160 lb 12.8 oz (72.9 kg)  10/09/19 159 lb (72.1 kg)    BP 140/78   Pulse 92   Temp (!) 97.2 F (36.2 C)   Ht 5' 2"  (1.575 m)   Wt 160 lb 8 oz (72.8 kg)   LMP  (LMP Unknown)   SpO2 99%   BMI 29.36 kg/m    Physical Exam Constitutional:      General: She is not in acute distress.    Appearance: She is well-developed. She is not diaphoretic.  HENT:     Right Ear: External ear normal.     Left Ear: External ear normal.     Nose: Nose normal.  Eyes:     Conjunctiva/sclera: Conjunctivae normal.  Cardiovascular:     Rate and Rhythm: Normal rate.  Pulmonary:     Effort: Pulmonary effort is normal.  Musculoskeletal:     Cervical back: Neck supple.  Skin:    General: Skin is warm and dry.     Capillary Refill: Capillary refill takes less than 2 seconds.  Neurological:     Mental Status: She is alert. Mental status is at baseline.  Psychiatric:        Mood and Affect: Mood normal.        Behavior: Behavior normal.           Assessment & Plan:    Problem List Items Addressed This Visit      Respiratory   Reactive airway disease    Rare need for albuterol, but prior expired. Refill today      Relevant Medications   albuterol (VENTOLIN HFA) 108 (90 Base) MCG/ACT inhaler     Nervous and Auditory   Lumbar radiculopathy - Primary    Following with NSG and has upcoming MRI. Given her variation in need for opiates will hold off on contract today. Will reassess in 2 months pending NSG intervention plan. Cont current dosing, try to minimize use as much as possible.       Relevant Medications   gabapentin (NEURONTIN) 300 MG capsule   traMADol (ULTRAM) 50 MG tablet   Neuropathy    Improved with gabapentin but still getting some symptoms. Discussed increasing to 300 mg twice daily and 600 mg at night. Monitor for sedation.       Relevant Medications   gabapentin (NEURONTIN) 300 MG capsule  Return in about 2 months (around 02/11/2020).  Lesleigh Noe, MD

## 2019-12-12 NOTE — Assessment & Plan Note (Signed)
Improved with gabapentin but still getting some symptoms. Discussed increasing to 300 mg twice daily and 600 mg at night. Monitor for sedation.

## 2019-12-12 NOTE — Patient Instructions (Addendum)
#  Neuropathy - Continue taking Gabapentin 300 mg in the morning and afternoon - At night, increase to Gabapentin 600 mg (two capsules)  - call if this seems like too high of a dose and I can send in 100 mg capsules to do a slower increase  #Back pain - make sure you are watching out for constipation

## 2019-12-12 NOTE — Assessment & Plan Note (Signed)
Rare need for albuterol, but prior expired. Refill today

## 2019-12-12 NOTE — Assessment & Plan Note (Addendum)
Following with NSG and has upcoming MRI. Given her variation in need for opiates will hold off on contract today. Will reassess in 2 months pending NSG intervention plan. Cont current dosing, try to minimize use as much as possible.

## 2019-12-17 ENCOUNTER — Other Ambulatory Visit: Payer: PPO

## 2019-12-22 ENCOUNTER — Ambulatory Visit
Admission: RE | Admit: 2019-12-22 | Discharge: 2019-12-22 | Disposition: A | Discharge status: Home / Self Care | Payer: PPO | Source: Ambulatory Visit | Attending: Physical Medicine and Rehabilitation | Admitting: Physical Medicine and Rehabilitation

## 2019-12-22 ENCOUNTER — Other Ambulatory Visit: Payer: Self-pay

## 2019-12-22 DIAGNOSIS — M545 Low back pain, unspecified: Secondary | ICD-10-CM

## 2019-12-22 DIAGNOSIS — M48061 Spinal stenosis, lumbar region without neurogenic claudication: Secondary | ICD-10-CM | POA: Diagnosis not present

## 2019-12-24 ENCOUNTER — Ambulatory Visit: Payer: PPO | Admitting: Cardiology

## 2019-12-27 DIAGNOSIS — M5416 Radiculopathy, lumbar region: Secondary | ICD-10-CM | POA: Diagnosis not present

## 2019-12-27 DIAGNOSIS — M5126 Other intervertebral disc displacement, lumbar region: Secondary | ICD-10-CM | POA: Diagnosis not present

## 2019-12-27 DIAGNOSIS — M545 Low back pain: Secondary | ICD-10-CM | POA: Diagnosis not present

## 2020-01-02 ENCOUNTER — Encounter: Payer: Self-pay | Admitting: Family Medicine

## 2020-01-02 ENCOUNTER — Ambulatory Visit (INDEPENDENT_AMBULATORY_CARE_PROVIDER_SITE_OTHER): Payer: PPO | Admitting: Family Medicine

## 2020-01-02 ENCOUNTER — Other Ambulatory Visit: Payer: Self-pay

## 2020-01-02 ENCOUNTER — Encounter: Payer: Self-pay | Admitting: Cardiovascular Disease

## 2020-01-02 ENCOUNTER — Ambulatory Visit: Payer: PPO | Admitting: Cardiovascular Disease

## 2020-01-02 ENCOUNTER — Telehealth: Payer: Self-pay | Admitting: Cardiovascular Disease

## 2020-01-02 ENCOUNTER — Encounter: Payer: Self-pay | Admitting: *Deleted

## 2020-01-02 VITALS — BP 126/66 | HR 87 | Temp 96.5°F | Resp 20 | Ht 62.0 in | Wt 160.5 lb

## 2020-01-02 VITALS — BP 122/68 | HR 70 | Ht 62.0 in | Wt 159.0 lb

## 2020-01-02 DIAGNOSIS — R079 Chest pain, unspecified: Secondary | ICD-10-CM | POA: Diagnosis not present

## 2020-01-02 DIAGNOSIS — R0600 Dyspnea, unspecified: Secondary | ICD-10-CM

## 2020-01-02 DIAGNOSIS — R0602 Shortness of breath: Secondary | ICD-10-CM

## 2020-01-02 DIAGNOSIS — I2510 Atherosclerotic heart disease of native coronary artery without angina pectoris: Secondary | ICD-10-CM | POA: Diagnosis not present

## 2020-01-02 DIAGNOSIS — I252 Old myocardial infarction: Secondary | ICD-10-CM | POA: Diagnosis not present

## 2020-01-02 DIAGNOSIS — R0609 Other forms of dyspnea: Secondary | ICD-10-CM

## 2020-01-02 NOTE — Telephone Encounter (Signed)
Left message on VM that ok for her son to accompany patient at appointment.

## 2020-01-02 NOTE — Telephone Encounter (Signed)
New Message  Patient is calling in to approval for her son in law to accompany her to her appointment with Dr. Angelena Form today at 3:00pm. States that she is very nervous and would like a second set of ears at the appointment with her to listen and understand the Doctor. Please give patient a call back to confirm.

## 2020-01-02 NOTE — Progress Notes (Signed)
Subjective:     Hannah Vasquez is a 69 y.o. female presenting for Shortness of Breath (not able to see her pulmonologist until june and sees GI at the end of June)     HPI  #Shortness of breath - difficulty having a conversation with with someone w/o having to take a deep breath - getting sternal chest pain  - feels like it is "closed up" will rest and everything will improve - last week was having some stomach pain - has GI appointment June 30 - has Pulm appointment June 8 - has not tried the nitroglycerin - uses inhaler w/ some improvement but does not resolve completely - cardiology appointment March  - Worse: talking, activity - Improves: inhaler and rest - occasional wheezing  - no leg swelling, but has noticed her fingers are swelling - no PND  - sleeping on 2 pillows (previously 1 pillow) - which improves the breathing - chest discomfort is a squeezing pain  #chronic back pain - getting an epidural in the back - was relating this to all her symptoms   Review of Systems   Social History   Tobacco Use  Smoking Status Never Smoker  Smokeless Tobacco Never Used        Objective:    BP Readings from Last 3 Encounters:  01/02/20 126/66  12/12/19 140/78  11/09/19 130/74   Wt Readings from Last 3 Encounters:  01/02/20 160 lb 8 oz (72.8 kg)  12/12/19 160 lb 8 oz (72.8 kg)  11/09/19 160 lb 12.8 oz (72.9 kg)    BP 126/66   Pulse 87   Temp (!) 96.5 F (35.8 C)   Resp 20   Ht 5' 2"  (1.575 m)   Wt 160 lb 8 oz (72.8 kg)   LMP  (LMP Unknown)   SpO2 98%   BMI 29.36 kg/m    Physical Exam Constitutional:      General: She is not in acute distress.    Appearance: She is well-developed. She is not diaphoretic.  HENT:     Head: Normocephalic and atraumatic.     Right Ear: External ear normal.     Left Ear: External ear normal.  Eyes:     Conjunctiva/sclera: Conjunctivae normal.  Cardiovascular:     Rate and Rhythm: Normal rate and regular rhythm.       Heart sounds: No murmur.  Pulmonary:     Effort: Pulmonary effort is normal. No respiratory distress.     Breath sounds: Normal breath sounds. No wheezing or rales.  Abdominal:     General: Abdomen is flat. Bowel sounds are normal. There is no distension.     Palpations: Abdomen is soft.     Tenderness: There is abdominal tenderness (generalized). There is no guarding or rebound.  Musculoskeletal:     Cervical back: Neck supple.     Right lower leg: No edema.     Left lower leg: No edema.  Skin:    General: Skin is warm and dry.     Capillary Refill: Capillary refill takes less than 2 seconds.  Neurological:     Mental Status: She is alert. Mental status is at baseline.  Psychiatric:        Mood and Affect: Mood normal.        Behavior: Behavior normal.     EKG: Stable RBBB, sinus rhythm, no ST elevation. Some T-wave abnormalities      Assessment & Plan:   Problem List Items Addressed  This Visit      Other   History of non-ST elevation myocardial infarction (NSTEMI)   Shortness of breath - Primary    Pt with hx of reactive airway disease, NSTEMI, and CHF who presented with several weeks of on and off worsening SOB with activity and associated epigastric pain as well as occasional neck pain. EKG stable from prior and her VS are stable in the office today. However, symptoms highly concerning for angina with clear lungs and no edema on exam today. ER precautions and urgent cardiology visit (already established and will be seen at 3 pm today). Appreciate cardiology support. Discussed if cardiology work-up negative would recommend f/u visit for neck symptoms (fm hx of brain cancer) and consider trial of daily inhaler until she can see pulmonology. Take nitroglycerin prn for pain.       Relevant Orders   EKG 12-Lead (Completed)    Other Visit Diagnoses    Exertional chest pain           Return if symptoms worsen or fail to improve.  Lesleigh Noe, MD

## 2020-01-02 NOTE — Patient Instructions (Signed)
I am going to work on getting you into Cardiology this week.   I'm concerned that your symptoms are due to your heart.   If you get symptoms - try taking a nitroglycerin to see if they improve  Go to the ER if you develop worsening breathing, chest or stomach discomfort which does not improve with rest.

## 2020-01-02 NOTE — Progress Notes (Signed)
Chief Complaint  Patient presents with  . Follow-up    Chest pain   History of Present Illness: 69 yo female with history of CAD, anxiety, HTN and MVP who is added onto my schedule today for follow up. She is followed in our office by Dr. Marlou Porch. She had an anterior STEMI in May 2018 and had two stents placed in the LAD. Her LVEF was 40% post MI but normalized by echo July 2018. A small diagonal branch was jailed by the stents and she also had disease in a small intermediate branch. She has been on ASA, statin, Imdur and a beta blocker. She was last seen by Dr. Marlou Porch 11/09/19 and was doing well. She presented to her primary care office this morning with c/o dyspnea, back pain, head pain and epigastric pain. She has had recent epidural injections in her back. She has no LE edema or weight gain. She has noticed pains in the left side of her head above her ear that radiate down to her ears and neck. No exertional chest pressure or pain. Dyspnea during the day with exertion.  EKG this am with sinus rhythm with RBBB. She had chest pressure at the time of her heart attack and she is not having similar pain now.   Primary Care Physician: Lesleigh Noe, MD   Past Medical History:  Diagnosis Date  . Allergy   . Anxiety   . CAD (coronary artery disease)   . Cataract   . Does use hearing aid    bilateral as of 10/05/2019  . Hemorrhoids   . History of MI (myocardial infarction) 10/07/2017  . Hypertension   . Mitral valve prolapse   . Osteoporosis   . Status post dilation of esophageal narrowing     Past Surgical History:  Procedure Laterality Date  . BASAL CELL CARCINOMA EXCISION  02/05/2016   Dr. Sarajane Jews, [facility]  . BLADDER SURGERY  11/15/1995   bladder neck suspension, urinary incontinence  . CARDIAC CATHETERIZATION  01/03/2017 DES LAD  . CATARACT EXTRACTION Left 04/30/2016   Dr. Tommy Rainwater, [place] N/A 01/03/2017   Procedure: Coronary  Stent Intervention;  Surgeon: Nelva Bush, MD;  Location: [place] [facility];  Service: Cardiovascular;  Laterality: N/A;  . DENTAL SURGERY  06/20/2016   Tooth Implant; Dr. Kalman Shan  . INTRAVASCULAR ULTRASOUND/IVUS N/A 01/03/2017   Procedure: Intravascular Ultrasound/IVUS;  Surgeon: Nelva Bush, MD;  Location: [place];  Service: Cardiovascular;  Laterality: N/A;  . KNEE SURGERY  2003   torn meniscus  . LEFT HEART CATH AND CORONARY ANGIOGRAPHY N/A 01/03/2017   Procedure: Left Heart Cath and Coronary Angiography;  Surgeon: Nelva Bush, MD;  Location: [place];  Service: Cardiovascular;  Laterality: N/A;  . MOHS SURGERY Right 2017   right side of face  . ROTATOR CUFF REPAIR    . SHOULDER SURGERY Right 2005    Current Outpatient Medications  Medication Sig Dispense Refill  . acetaminophen (TYLENOL) 500 MG tablet Take 1 tablet (500 mg total) by mouth 2 (two) times daily. 60 tablet 0  . albuterol (VENTOLIN HFA) 108 (90 Base) MCG/ACT inhaler Inhale 1 puff into the lungs every 6 (six) hours as needed for wheezing or shortness of breath. 18 g 1  . amLODipine (NORVASC) 5 MG tablet Take 1 tablet (5 mg total) by mouth daily. 90 tablet 3  . aspirin EC 81 MG tablet Take 81 mg by mouth every morning.     Marland Kitchen  atorvastatin (LIPITOR) 80 MG tablet TAKE 1 TABLET BY MOUTH ONCE A DAY AT [facility] THE EVENING. 90 tablet 3  . Calcium-Magnesium-Vitamin D (CALCIUM 1200+D3 PO) Take 1,200 mg by mouth.    . Cholecalciferol (VITAMIN D3) 1000 UNITS CAPS Take 1 capsule by mouth every morning.     . diclofenac sodium (VOLTAREN) 1 % GEL Apply 4 g topically 4 (four) times daily. To affected joint. 100 g 2  . fluticasone (FLONASE) 50 MCG/ACT nasal spray Place 1 spray into both nostrils as needed.     . furosemide (LASIX) 20 MG tablet Take 1 tablet (20 mg total) by mouth daily as needed. 90 tablet 3  . gabapentin (NEURONTIN) 300 MG capsule Take 1 capsule (300 mg total) by mouth 3 (three) times daily.  270 capsule 1  . isosorbide mononitrate (IMDUR) 30 MG 24 hr tablet Take 1 tablet (30 mg total) by mouth daily. 90 tablet 3  . loratadine (CLARITIN) 10 MG tablet Take 1 tablet (10 mg total) by mouth every morning. 30 tablet 2  . losartan (COZAAR) 100 MG tablet TAKE 1 TABLET BY MOUTH ONCE A DAY 90 tablet 3  . metoprolol succinate (TOPROL-XL) 25 MG 24 hr tablet Take 1 tablet (25 mg total) by mouth daily. 90 tablet 3  . Multiple Vitamin (MULTIVITAMIN) tablet Take 1 tablet by mouth every morning.     . nitroGLYCERIN (NITROSTAT) 0.4 MG SL tablet Place 1 tablet (0.4 mg total) under the tongue every 5 (five) minutes as needed for chest pain. 25 tablet 3  . Omega-3 Fatty Acids (FISH OIL) 1200 MG CAPS Take 1 capsule by mouth every morning.     Marland Kitchen omeprazole (PRILOSEC) 40 MG capsule Take 40 mg by mouth daily.     . potassium chloride SA (K-DUR) 20 MEQ tablet Take 2 tablets (40 mEq total) by mouth 2 (two) times daily. 360 tablet 3  . tiZANidine (ZANAFLEX) 4 MG tablet Take 0.5-1 tablets (2-4 mg total) by mouth as needed. 30 tablet 1  . traMADol (ULTRAM) 50 MG tablet Take 1 tablet (50 mg total) by mouth every 6 (six) hours as needed for severe pain. 90 tablet 1  . vitamin B-12 (CYANOCOBALAMIN) 100 MCG tablet Take 1 tablet (100 mcg total) by mouth daily.    . hydrochlorothiazide (HYDRODIURIL) 25 MG tablet Take 1 tablet (25 mg total) by mouth daily. 90 tablet 3   No current facility-administered medications for this visit.    Allergies  Allergen Reactions  . Bactroban [Mupirocin Calcium]     Facial redness and swelling  . Codeine     REACTION: nausea and vomiting  . Penicillins Nausea And Vomiting    REACTION: rash  . Valtrex [Valacyclovir Hcl] Swelling    Per pt causes redness    Social History   Socioeconomic History  . Marital status: Divorced    Spouse name: Not on file  . Number of children: 2  . Years of education: high school  . Highest education level: Not on file  Occupational History   . Occupation: Medical office---front desk    Comment: Retired  Tobacco Use  . Smoking status: Never Smoker  . Smokeless tobacco: Never Used  Substance and Sexual Activity  . Alcohol use: No    Alcohol/week: 0.0 standard drinks  . Drug use: No  . Sexual activity: Not Currently    Birth control/protection: Post-menopausal  Other Topics Concern  . 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 Difficulty of Paying Living Expenses:   Food Insecurity:   . Worried About Charity fundraiser in the Last Year:   . Arboriculturist in the Last Year:   Transportation Needs:   . Film/video editor (Medical):   Marland Kitchen Lack of Transportation (Non-Medical):   Physical Activity:   . Days of Exercise per Week:   . Minutes of Exercise per Session:   Stress:   . Feeling of Stress :   Social Connections:   . Frequency of Communication with Friends and Family:   . Frequency of Social Gatherings with Friends and Family:   . Attends Religious Services:   . Active Member of Clubs or Organizations:   . Attends Archivist Meetings:   Marland Kitchen Marital Status:   Intimate Partner Violence:   . Fear of Current or Ex-Partner:   . Emotionally Abused:   Marland Kitchen Physically Abused:   . Sexually Abused:     Family History  Problem Relation Age of Onset  . Mitral valve prolapse Mother   . Hypertension Mother   . Hypertension Father   . Brain cancer Father     Review of Systems:  As stated in the HPI and  otherwise negative.   BP 122/68   Pulse 70   Ht 5' 2"  (1.575 m)   Wt 159 lb (72.1 kg)   LMP  (LMP Unknown)   SpO2 97%   BMI 29.08 kg/m   Physical Examination: General: Well developed, well nourished, NAD  HEENT: OP clear, mucus membranes moist  SKIN: warm, dry. No rashes. Neuro: No focal deficits  Musculoskeletal: Muscle strength 5/5 all ext  Psychiatric: Mood and affect normal  Neck: No JVD, no carotid bruits, no thyromegaly, no lymphadenopathy.  Lungs:Clear bilaterally, no wheezes, rhonci, crackles Cardiovascular: Regular rate and rhythm. No murmurs, gallops or rubs. Abdomen:Soft. Bowel sounds present. Non-tender.  Extremities: No lower extremity edema. Pulses are 2 + in the bilateral DP/PT.  EKG:  EKG is not ordered today. The ekg ordered today demonstrates  I reviewed the EKG from early this am in primary care and it shows sinus rhythm with chronic RBBB  Recent Labs: 07/11/2019: TSH 2.57 07/23/2019: Hemoglobin 13.0; Platelets 332.0 09/25/2019: ALT 22 11/09/2019: BUN 11; Creatinine, Ser 0.98; Potassium 3.5; Sodium 141   Lipid Panel    Component Value Date/Time   CHOL 115 11/09/2019 0844   TRIG 123 11/09/2019 0844   HDL 30 (L) 11/09/2019 0844   CHOLHDL 3.8 11/09/2019 0844   CHOLHDL 6.3 01/02/2017 0144   VLDL 39 01/02/2017 0144   LDLCALC 63 11/09/2019 0844   LDLDIRECT 151.0 09/09/2011 0919     Wt Readings from Last 3 Encounters:  01/02/20 159 lb (72.1 kg)  01/02/20 160 lb 8 oz (72.8 kg)  12/12/19 160 lb 8 oz (72.8 kg)  Assessment and Plan:  1. CAD with prior stenting of the LAD 2. Dyspnea  Her symptoms are not typical for angina. EKG without ischemic changes. She has mostly fatigue and dyspnea which was not her presenting symptom with her MI in 2018. No exertional chest pain. No evidence of volume overload. I will arrange an echo to assess LV systolic function and exclude valvular disease. I will arrange an exercise nuclear stress test to exclude ischemia.    Follow up in 2-3 weeks with Dr. Marlou Porch  Current medicines are reviewed at length with the patient today.  The patient does not have concerns regarding medicines.  The following changes have been made:  no change  Labs/ tests ordered today include:   Orders Placed This Encounter  Procedures  . MYOCARDIAL PERFUSION IMAGING  . ECHOCARDIOGRAM COMPLETE   Disposition:   F/U with Dr. Marlou Porch in 2-3 weeks.   Signed, Lauree Chandler, MD 01/02/2020 3:39 PM    [facility] [address], [place], [place]  [address] Phone: [phone]; Fax: [phone]

## 2020-01-02 NOTE — Assessment & Plan Note (Addendum)
Pt with hx of reactive airway disease, NSTEMI, and CHF who presented with several weeks of on and off worsening SOB with activity and associated epigastric pain as well as occasional neck pain. EKG stable from prior and her VS are stable in the office today. However, symptoms highly concerning for angina with clear lungs and no edema on exam today. ER precautions and urgent cardiology visit (already established and will be seen at 3 pm today). Appreciate cardiology support. Discussed if cardiology work-up negative would recommend f/u visit for neck symptoms (fm hx of brain cancer) and consider trial of daily inhaler until she can see pulmonology. Take nitroglycerin prn for pain.

## 2020-01-02 NOTE — Patient Instructions (Signed)
Medication Instructions:  No changes today *If you need a refill on your cardiac medications before your next appointment, please call your pharmacy*   Lab Work: none If you have labs (blood work) drawn today and your tests are completely normal, you will receive your results only by: Marland Kitchen MyChart Message (if you have MyChart) OR . A paper copy in the mail If you have any lab test that is abnormal or we need to change your treatment, we will call you to review the results.   Testing/Procedures: Your physician has requested that you have an echocardiogram. Echocardiography is a painless test that uses sound waves to create images of your heart. It provides your doctor with information about the size and shape of your heart and how well your heart's chambers and valves are working. This procedure takes approximately one hour. There are no restrictions for this procedure.  Your physician has requested that you have en exercise stress myoview. For further information please visit [email or web address]. Please follow instruction sheet, as given.    Follow-Up: At [facility], you and your health needs are our priority.  As part of our continuing mission to provide you with exceptional heart care, we have created designated Provider Care Teams.  These Care Teams include your primary Cardiologist (physician) and Advanced Practice Providers (APPs -  Physician Assistants and Nurse Practitioners) who all work together to provide you with the care you need, when you need it.  Your next appointment:   3 week(s)  The format for your next appointment:   In Person  Provider:   You may see Candee Furbish, MD or one of the following Advanced Practice Providers on your designated Care Team:    Truitt Merle, NP  Cecilie Kicks, NP  Kathyrn Drown, NP    Other Instructions

## 2020-01-04 DIAGNOSIS — M545 Low back pain: Secondary | ICD-10-CM | POA: Diagnosis not present

## 2020-01-04 DIAGNOSIS — M5416 Radiculopathy, lumbar region: Secondary | ICD-10-CM | POA: Diagnosis not present

## 2020-01-04 DIAGNOSIS — M5126 Other intervertebral disc displacement, lumbar region: Secondary | ICD-10-CM | POA: Diagnosis not present

## 2020-01-15 ENCOUNTER — Telehealth: Payer: Self-pay | Admitting: Internal Medicine

## 2020-01-15 NOTE — Telephone Encounter (Signed)
Spoke with patient, patient aware that she does not need to fast for lab work. Pt aware that lab orders are already placed.

## 2020-01-16 ENCOUNTER — Other Ambulatory Visit (INDEPENDENT_AMBULATORY_CARE_PROVIDER_SITE_OTHER): Payer: PPO

## 2020-01-16 DIAGNOSIS — R748 Abnormal levels of other serum enzymes: Secondary | ICD-10-CM | POA: Diagnosis not present

## 2020-01-16 LAB — HEPATIC FUNCTION PANEL
ALT: 22 U/L (ref 0–35)
AST: 30 U/L (ref 0–37)
Albumin: 4 g/dL (ref 3.5–5.2)
Alkaline Phosphatase: 117 U/L (ref 39–117)
Bilirubin, Direct: 0.1 mg/dL (ref 0.0–0.3)
Total Bilirubin: 0.6 mg/dL (ref 0.2–1.2)
Total Protein: 7.1 g/dL (ref 6.0–8.3)

## 2020-01-16 LAB — ALKALINE PHOSPHATASE: Alkaline Phosphatase: 117 U/L (ref 39–117)

## 2020-01-16 NOTE — Telephone Encounter (Signed)
See result note.  

## 2020-01-16 NOTE — Telephone Encounter (Signed)
Pt calling for lab results.

## 2020-01-16 NOTE — Telephone Encounter (Signed)
Is requesting a call with her results as soon as we receive them please.

## 2020-01-17 ENCOUNTER — Other Ambulatory Visit: Payer: Self-pay

## 2020-01-17 DIAGNOSIS — R748 Abnormal levels of other serum enzymes: Secondary | ICD-10-CM

## 2020-01-21 ENCOUNTER — Other Ambulatory Visit (HOSPITAL_COMMUNITY)
Admission: RE | Admit: 2020-01-21 | Discharge: 2020-01-21 | Disposition: A | Discharge status: Home / Self Care | Payer: PPO | Source: Ambulatory Visit | Attending: Cardiovascular Disease | Admitting: Cardiovascular Disease

## 2020-01-21 DIAGNOSIS — Z20822 Contact with and (suspected) exposure to covid-19: Secondary | ICD-10-CM | POA: Insufficient documentation

## 2020-01-21 DIAGNOSIS — Z01812 Encounter for preprocedural laboratory examination: Secondary | ICD-10-CM | POA: Insufficient documentation

## 2020-01-21 LAB — SARS CORONAVIRUS 2 (TAT 6-24 HRS): SARS Coronavirus 2: NEGATIVE

## 2020-01-22 ENCOUNTER — Ambulatory Visit: Payer: PPO | Admitting: Emergency Medicine

## 2020-01-23 ENCOUNTER — Telehealth (HOSPITAL_COMMUNITY): Payer: Self-pay

## 2020-01-23 NOTE — Progress Notes (Signed)
Cardiology Office Note   Date:  01/25/2020   ID:  Hannah Vasquez, DOB [birth year], MRN [id number]  PCP:  Lesleigh Noe, MD  Cardiologist:  Dr. Marlou Porch, MD  Chief Complaint  Patient presents with  . Follow-up  . Fatigue    History of Present Illness: Hannah Vasquez is a 69 y.o. female who presents for testing follow up, seen for Dr. Marlou Porch.   Hannah Vasquez has a hx of CAD, anxiety, HTN and MVP. She was recently seen by Dr. Angelena Form, DOD, on 01/02/20. She had presented to her primary care office on day of OV with c/o dyspnea, back pain, head pain and epigastric pain. She has had recent epidural injections in her back. EKG was performed which showed sinus rhythm with RBBB. She had chest pressure at the time of her heart attack and reported no similarities in symptoms.   She has a hx of anterior STEMI 12/2016 at which time she had PCI/DESx2 to the LAD. LVEF noted to be 40% post MI but normalized by echo 02/2017. A small diagonal branch was jailed by the stents and she also had disease in a small intermediate branch. She has tolerated therapy with ASA, statin, Imdur and a beta blocker.   Plan was for echocardiogram to assess LV function and exclude valvular disase and exercise stress test  Both tests to be performed 01/24/20 which were found to be normal.  She is here today for follow-up with her son.  Her largest complaint today seems to be overwhelming fatigue.  She reports that approximately 3 months ago she was able to run errands and clean her home without difficulty however has more recently been so fatigued she can only do activities for 10 to 15 minutes at a time and will need to rest.  She does not have significant shortness of breath or chest pain with this however is concerning given her history of CAD.  As above, she underwent a stress test and an echocardiogram which were both found to be normal which is reassuring.  Reviewed test results in depth with patient and her son.  Given her  persistent symptoms, I have recommended we draw lab work today.  She has a follow-up with pulmonology next month as well as GI.  Once lab work is complete, will forward to patient's PCP.  I have also recommended she follow closely to be evaluated for other fatigued etiologies.  We will also send note to Dr. Gypsy Balsam for his review.  If all other work-up is negative, may pursue LHC given history of CAD to definitively exclude progression.  Past Medical History:  Diagnosis Date  . Allergy   . Anxiety   . CAD (coronary artery disease)   . Cataract   . Does use hearing aid    bilateral as of 10/05/2019  . Hemorrhoids   . History of MI (myocardial infarction) 10/07/2017  . Hypertension   . Mitral valve prolapse   . Osteoporosis   . Status post dilation of esophageal narrowing     Past Surgical History:  Procedure Laterality Date  . BASAL CELL CARCINOMA EXCISION  02/05/2016   Dr. Sarajane Jews, [facility]  . BLADDER SURGERY  11/15/1995   bladder neck suspension, urinary incontinence  . CARDIAC CATHETERIZATION  01/03/2017 DES LAD  . CATARACT EXTRACTION Left 04/30/2016   Dr. Tommy Rainwater, [place] N/A 01/03/2017   Procedure: Coronary Stent Intervention;  Surgeon: Nelva Bush, MD;  Location: [facility]  INVASIVE CV LAB;  Service: Cardiovascular;  Laterality: N/A;  . DENTAL SURGERY  06/20/2016   Tooth Implant; Dr. Kalman Shan  . INTRAVASCULAR ULTRASOUND/IVUS N/A 01/03/2017   Procedure: Intravascular Ultrasound/IVUS;  Surgeon: Nelva Bush, MD;  Location: [place] CV LAB;  Service: Cardiovascular;  Laterality: N/A;  . KNEE SURGERY  2003   torn meniscus  . LEFT HEART CATH AND CORONARY ANGIOGRAPHY N/A 01/03/2017   Procedure: Left Heart Cath and Coronary Angiography;  Surgeon: Nelva Bush, MD;  Location: [place] CV LAB;  Service: Cardiovascular;  Laterality: N/A;  . MOHS SURGERY Right 2017   right side of face  . ROTATOR CUFF REPAIR    . SHOULDER SURGERY  Right 2005     Current Outpatient Medications  Medication Sig Dispense Refill  . albuterol (VENTOLIN HFA) 108 (90 Base) MCG/ACT inhaler Inhale 1 puff into the lungs every 6 (six) hours as needed for wheezing or shortness of breath. 18 g 1  . amLODipine (NORVASC) 5 MG tablet Take 1 tablet (5 mg total) by mouth daily. 90 tablet 3  . aspirin EC 81 MG tablet Take 81 mg by mouth every morning.     Marland Kitchen atorvastatin (LIPITOR) 80 MG tablet TAKE 1 TABLET BY MOUTH ONCE A DAY AT [facility] THE EVENING. 90 tablet 3  . Calcium-Magnesium-Vitamin D (CALCIUM 1200+D3 PO) Take 1,200 mg by mouth.    . Cholecalciferol (VITAMIN D3) 1000 UNITS CAPS Take 1 capsule by mouth every morning.     . diclofenac sodium (VOLTAREN) 1 % GEL Apply 4 g topically 4 (four) times daily. To affected joint. 100 g 2  . fluticasone (FLONASE) 50 MCG/ACT nasal spray Place 1 spray into both nostrils as needed.     . furosemide (LASIX) 20 MG tablet Take 1 tablet (20 mg total) by mouth daily as needed. 90 tablet 3  . gabapentin (NEURONTIN) 300 MG capsule Take 1 capsule (300 mg total) by mouth 3 (three) times daily. 270 capsule 1  . isosorbide mononitrate (IMDUR) 30 MG 24 hr tablet Take 1 tablet (30 mg total) by mouth daily. 90 tablet 3  . loratadine (CLARITIN) 10 MG tablet Take 1 tablet (10 mg total) by mouth every morning. 30 tablet 2  . losartan (COZAAR) 100 MG tablet TAKE 1 TABLET BY MOUTH ONCE A DAY 90 tablet 3  . metoprolol succinate (TOPROL-XL) 25 MG 24 hr tablet Take 1 tablet (25 mg total) by mouth daily. 90 tablet 3  . Multiple Vitamin (MULTIVITAMIN) tablet Take 1 tablet by mouth every morning.     . nitroGLYCERIN (NITROSTAT) 0.4 MG SL tablet Place 1 tablet (0.4 mg total) under the tongue every 5 (five) minutes as needed for chest pain. 25 tablet 3  . Omega-3 Fatty Acids (FISH OIL) 1200 MG CAPS Take 1 capsule by mouth every morning.     Marland Kitchen omeprazole (PRILOSEC) 40 MG capsule Take 40 mg by mouth daily.     . potassium chloride SA (K-DUR) 20  MEQ tablet Take 2 tablets (40 mEq total) by mouth 2 (two) times daily. 360 tablet 3  . tiZANidine (ZANAFLEX) 4 MG tablet Take 0.5-1 tablets (2-4 mg total) by mouth as needed. 30 tablet 1  . traMADol (ULTRAM) 50 MG tablet Take 1 tablet (50 mg total) by mouth every 6 (six) hours as needed for severe pain. 90 tablet 1  . vitamin B-12 (CYANOCOBALAMIN) 100 MCG tablet Take 1 tablet (100 mcg total) by mouth daily.    . hydrochlorothiazide (HYDRODIURIL) 25 MG tablet Take  1 tablet (25 mg total) by mouth daily. 90 tablet 3   No current facility-administered medications for this visit.    Allergies:   Bactroban [mupirocin calcium], Codeine, Penicillins, and Valtrex [valacyclovir hcl]    Social History:  The patient  reports that she has never smoked. She has never used smokeless tobacco. She reports that she does not drink alcohol and does not use drugs.   Family History:  The patient's family history includes Brain cancer in her father; Hypertension in her father and mother; Mitral valve prolapse in her mother.    ROS:  Please see the history of present illness. Otherwise, review of systems are positive for none.   All other systems are reviewed and negative.    PHYSICAL EXAM: VS:  BP 136/86   Pulse 95   Ht 5' 2"  (1.575 m)   Wt 160 lb 12.8 oz (72.9 kg)   LMP  (LMP Unknown)   SpO2 96%   BMI 29.41 kg/m  , BMI Body mass index is 29.41 kg/m.   General: Well developed, well nourished, NAD Neck: Negative for carotid bruits. No JVD Lungs:Clear to ausculation bilaterally. No wheezes, rales, or rhonchi. Breathing is unlabored. Cardiovascular: RRR with S1 S2. No murmurs Extremities: No edema.  Radial pulses 2+ bilaterally Neuro: Alert and oriented. No focal deficits. No facial asymmetry. MAE spontaneously. Psych: Responds to questions appropriately with normal affect.     EKG:  EKG is not ordered today.   Recent Labs: 07/11/2019: TSH 2.57 07/23/2019: Hemoglobin 13.0; Platelets  332.0 11/09/2019: BUN 11; Creatinine, Ser 0.98; Potassium 3.5; Sodium 141 01/16/2020: ALT 22    Lipid Panel    Component Value Date/Time   CHOL 115 11/09/2019 0844   TRIG 123 11/09/2019 0844   HDL 30 (L) 11/09/2019 0844   CHOLHDL 3.8 11/09/2019 0844   CHOLHDL 6.3 01/02/2017 0144   VLDL 39 01/02/2017 0144   LDLCALC 63 11/09/2019 0844   LDLDIRECT 151.0 09/09/2011 0919      Wt Readings from Last 3 Encounters:  01/25/20 160 lb 12.8 oz (72.9 kg)  01/24/20 159 lb (72.1 kg)  01/02/20 159 lb (72.1 kg)      Other studies Reviewed: Additional studies/ records that were reviewed today include: . Review of the above records demonstrates:   Echocardiogram 02/2017:   - Left ventricle: The cavity size was normal. There was mild  concentric hypertrophy. Systolic function was normal. The  estimated ejection fraction was in the range of 60% to 65%. Wall  motion was normal; there were no regional wall motion  abnormalities. There was an increased relative contribution of  atrial contraction to ventricular filling. Doppler parameters are  consistent with abnormal left ventricular relaxation (grade 1  diastolic dysfunction).  - Aortic valve: Trileaflet; normal thickness, moderately calcified  leaflets.  - Mitral valve: Trivial, late systolicprolapse, involving the  anterior leaflet.  - Pulmonary arteries: Systolic pressure could not be accurately  estimated.    Lexiscan stress test 01/24/20:   The left ventricular ejection fraction is hyperdynamic (>65%).  Nuclear stress EF: 77%.  There was no ST segment deviation noted during stress.  The study is normal.  This is a low risk study.   Low risk stress nuclear study with normal perfusion and normal left ventricular regional and global systolic function.  Echocardiogram 01/24/20:  1. Left ventricular ejection fraction, by estimation, is 60 to 65%. The  left ventricle has normal function. The left ventricle  has no regional  wall motion abnormalities.  Left ventricular diastolic parameters were  normal.  2. Right ventricular systolic function is normal. The right ventricular  size is normal.  3. The mitral valve is normal in structure. Trivial mitral valve  regurgitation. No evidence of mitral stenosis.  4. The aortic valve is tricuspid. Aortic valve regurgitation is not  visualized. No aortic stenosis is present.   ASSESSMENT AND PLAN:  1. CAD with prior PCI x2 to LAD 2018: -Recently seen as DOD work in appointment for chest pain, SOB felt to be atypical in nature>> reassuring.  She underwent Lexiscan stress test as well as an echocardiogram which were found to be normal however presents today with persistent symptoms of fatigue. -We will draw TSH, T3/T4 as well as CBC>> will forward results to PCP as I have recommended close follow-up with them.  Also has pulmonary appointment 02/2020 with Dr. Lamonte Sakai which I have encouraged that she keep. -If all other work-up without clear symptom etiology, could consider LHC to definitively exclude progressive CAD -Continue current regimen with ASA, atorvastatin, Toprol, Imdur  2. HTN: -Stable, 136/86 today -No change  3. MVP: -Last echocardiogram 02/2017 with trivial, late systolicprolapse, involving the anterior leaflet. -Repeat echocardiogram 01/24/2020>> no evidence   Current medicines are reviewed at length with the patient today.  The patient does not have concerns regarding medicines.  The following changes have been made:  no change  Labs/ tests ordered today include: CBC, TSH, T3/T4  Orders Placed This Encounter  Procedures  . T3, free  . T4, free  . TSH  . CBC     Disposition:   FU with Dr. Marlou Porch in 6 weeks  Signed, Kathyrn Drown, NP  01/25/2020 4:38 PM    [address], [place], [place]  [address] Phone: [phone]; Fax: [phone]

## 2020-01-23 NOTE — Telephone Encounter (Signed)
Spoke with the patient, detailed instructions given. She stated that she understood and would be here for her test. Asked to call back with any questions.

## 2020-01-24 ENCOUNTER — Ambulatory Visit (HOSPITAL_COMMUNITY): Payer: PPO | Attending: Cardiovascular Disease

## 2020-01-24 ENCOUNTER — Other Ambulatory Visit: Payer: Self-pay

## 2020-01-24 ENCOUNTER — Ambulatory Visit (HOSPITAL_BASED_OUTPATIENT_CLINIC_OR_DEPARTMENT_OTHER): Payer: PPO

## 2020-01-24 DIAGNOSIS — R0609 Other forms of dyspnea: Secondary | ICD-10-CM

## 2020-01-24 DIAGNOSIS — R0600 Dyspnea, unspecified: Secondary | ICD-10-CM | POA: Diagnosis not present

## 2020-01-24 DIAGNOSIS — I2510 Atherosclerotic heart disease of native coronary artery without angina pectoris: Secondary | ICD-10-CM | POA: Insufficient documentation

## 2020-01-24 LAB — ECHOCARDIOGRAM COMPLETE
Height: 62 in
Weight: 2544 oz

## 2020-01-24 LAB — MYOCARDIAL PERFUSION IMAGING
LV dias vol: 49 mL (ref 46–106)
LV sys vol: 11 mL
Peak HR: 118 {beats}/min
SDS: 0
SRS: 0
SSS: 0
TID: 0.98

## 2020-01-24 MED ORDER — TECHNETIUM TC 99M TETROFOSMIN IV KIT
10.2000 | PACK | Freq: Once | INTRAVENOUS | Status: AC | PRN
  Administered 2020-01-24: 10.2 via INTRAVENOUS
  Filled 2020-01-24: qty 11

## 2020-01-24 MED ORDER — REGADENOSON 0.4 MG/5ML IV SOLN
0.4000 mg | Freq: Once | INTRAVENOUS | Status: AC
Start: 2020-01-24 — End: 2020-01-24
  Administered 2020-01-24: 0.4 mg via INTRAVENOUS

## 2020-01-24 MED ORDER — TECHNETIUM TC 99M SESTAMIBI GENERIC - CARDIOLITE
31.4000 | Freq: Once | INTRAVENOUS | Status: AC | PRN
  Administered 2020-01-24: 31.4 via INTRAVENOUS
  Filled 2020-01-24: qty 32

## 2020-01-25 ENCOUNTER — Ambulatory Visit: Payer: PPO | Admitting: Cardiology

## 2020-01-25 ENCOUNTER — Encounter: Payer: Self-pay | Admitting: Cardiology

## 2020-01-25 VITALS — BP 136/86 | HR 95 | Ht 62.0 in | Wt 160.8 lb

## 2020-01-25 DIAGNOSIS — R0609 Other forms of dyspnea: Secondary | ICD-10-CM

## 2020-01-25 DIAGNOSIS — I2510 Atherosclerotic heart disease of native coronary artery without angina pectoris: Secondary | ICD-10-CM

## 2020-01-25 DIAGNOSIS — R5383 Other fatigue: Secondary | ICD-10-CM

## 2020-01-25 DIAGNOSIS — I10 Essential (primary) hypertension: Secondary | ICD-10-CM

## 2020-01-25 DIAGNOSIS — R0600 Dyspnea, unspecified: Secondary | ICD-10-CM | POA: Diagnosis not present

## 2020-01-25 NOTE — Patient Instructions (Signed)
Medication Instructions:   Your physician recommends that you continue on your current medications as directed. Please refer to the Current Medication list given to you today.  *If you need a refill on your cardiac medications before your next appointment, please call your pharmacy*  Lab Work:  You will have labs drawn today: TSH, T3, T4, CBC  If you have labs (blood work) drawn today and your tests are completely normal, you will receive your results only by: Marland Kitchen MyChart Message (if you have MyChart) OR . A paper copy in the mail If you have any lab test that is abnormal or we need to change your treatment, we will call you to review the results.  Testing/Procedures:  None ordered today  Follow-Up: At [facility], you and your health needs are our priority.  As part of our continuing mission to provide you with exceptional heart care, we have created designated Provider Care Teams.  These Care Teams include your primary Cardiologist (physician) and Advanced Practice Providers (APPs -  Physician Assistants and Nurse Practitioners) who all work together to provide you with the care you need, when you need it.  On 03/14/20 at 8:00AM with Candee Furbish, MD

## 2020-01-26 LAB — TSH: TSH: 2.4 u[IU]/mL (ref 0.450–4.500)

## 2020-01-26 LAB — CBC
Hematocrit: 38.7 % (ref 34.0–46.6)
Hemoglobin: 12.9 g/dL (ref 11.1–15.9)
MCH: 28 pg (ref 26.6–33.0)
MCHC: 33.3 g/dL (ref 31.5–35.7)
MCV: 84 fL (ref 79–97)
Platelets: 379 10*3/uL (ref 150–450)
RBC: 4.6 x10E6/uL (ref 3.77–5.28)
RDW: 13.7 % (ref 11.7–15.4)
WBC: 11 10*3/uL — ABNORMAL HIGH (ref 3.4–10.8)

## 2020-01-26 LAB — T4, FREE: Free T4: 0.94 ng/dL (ref 0.82–1.77)

## 2020-01-26 LAB — T3, FREE: T3, Free: 3.6 pg/mL (ref 2.0–4.4)

## 2020-01-28 ENCOUNTER — Other Ambulatory Visit: Payer: Self-pay | Admitting: Cardiology

## 2020-02-04 DIAGNOSIS — M545 Low back pain: Secondary | ICD-10-CM | POA: Diagnosis not present

## 2020-02-04 DIAGNOSIS — M5126 Other intervertebral disc displacement, lumbar region: Secondary | ICD-10-CM | POA: Diagnosis not present

## 2020-02-04 DIAGNOSIS — M5416 Radiculopathy, lumbar region: Secondary | ICD-10-CM | POA: Diagnosis not present

## 2020-02-08 ENCOUNTER — Other Ambulatory Visit: Payer: Self-pay

## 2020-02-08 ENCOUNTER — Ambulatory Visit: Payer: PPO | Admitting: Emergency Medicine

## 2020-02-08 ENCOUNTER — Ambulatory Visit (INDEPENDENT_AMBULATORY_CARE_PROVIDER_SITE_OTHER): Payer: PPO

## 2020-02-08 ENCOUNTER — Encounter: Payer: Self-pay | Admitting: Emergency Medicine

## 2020-02-08 VITALS — BP 124/62 | HR 66 | Temp 98.1°F | Ht 62.0 in | Wt 158.6 lb

## 2020-02-08 DIAGNOSIS — R0602 Shortness of breath: Secondary | ICD-10-CM

## 2020-02-08 MED ORDER — BREO ELLIPTA 100-25 MCG/INH IN AEPB
1.0000 | INHALATION_SPRAY | Freq: Every day | RESPIRATORY_TRACT | 2 refills | Status: DC
Start: 2020-02-08 — End: 2020-03-06

## 2020-02-08 NOTE — Progress Notes (Signed)
@Patient  ID: Hannah Vasquez, female    DOB: [birth year], 69 y.o.   MRN: [id number]  Chief Complaint  Patient presents with  . Follow-up    shortness of breath at rest and with exertion since March 2021    Referring provider: Lesleigh Noe, MD  HPI: 69 yo female never smoker seen for pulmonary consult 2019 for dyspnea Medical history significant for coronary disease status post MI in 2018.  Followed by cardiology   TEST/EVENTS :  Spirometry 2019 normal, negative bronchodilator response.  Diffusing capacity was slightly decreased but corrected to normal range when adjusted for alveolar volume.   02/08/2020 Follow up : Dyspnea  Patient returns for a follow-up visit.  Last seen in August 2019.  Patient complains over the last 3 months she has had progressive shortness of breath and extreme fatigue.  She was seen by her primary care provider with shortness of breath chest tightness and referred to cardiology.  She underwent a 2D echo January 24, 2020 that showed normal EF and no acute abnormalities.  She had a stress Lexiscan January 24, 2020 that was normal with no evidence of ischemia considered a low risk study. Patient denies any cough.  Has an occasional wheeze.  Feels that she is short of breath has low energy and severe fatigue.  Patient says along the same time she has some back issues and had some injections.  She has chronic neuropathy and pain and is on tramadol and Neurontin.  She has had no new medications.  She denies any exertional chest pain, hemoptysis, calf pain.  No history of VTE or family history of VTE. Started in March 2021  She says her appetite is good.  Weight has been steady. Prior to March did all housework, drove, . Active around house , no formal exercise  Lives alone . Retired.  Now she is not able to do her housework and feels totally exhausted at all times.  Prior to COVID-19 was able to do cardiac rehab and felt that she was very active. June 2021 labs  work showed  a normal CBC, TSH, T4, T3.  Labs in February showed a normal IgG, anti-smooth muscle, ANA, mitochondrial antibody and smooth muscle antibody negative Walk test in the office today without desaturations on room air.  O2 saturation remained above 90%  Allergies  Allergen Reactions  . Bactroban [Mupirocin Calcium]     Facial redness and swelling  . Codeine     REACTION: nausea and vomiting  . Penicillins Nausea And Vomiting    REACTION: rash  . Valtrex [Valacyclovir Hcl] Swelling    Per pt causes redness    Immunization History  Administered Date(s) Administered  . Fluad Quad(high Dose 65+) 04/09/2019  . Influenza Split 05/27/2011  . Influenza Whole 06/04/2009  . Influenza,inj,Quad PF,6+ Mos 06/07/2013, 06/12/2014, 05/09/2015, 06/11/2016, 06/03/2017, 04/27/2018  . Pneumococcal Conjugate-13 11/27/2014  . Pneumococcal Polysaccharide-23 11/08/2013  . Td 08/16/1993  . Tdap 09/09/2011  . Zoster 11/19/2009    Past Medical History:  Diagnosis Date  . Allergy   . Anxiety   . CAD (coronary artery disease)   . Cataract   . Does use hearing aid    bilateral as of 10/05/2019  . Hemorrhoids   . History of MI (myocardial infarction) 10/07/2017  . Hypertension   . Mitral valve prolapse   . Osteoporosis   . Status post dilation of esophageal narrowing     Tobacco History: Social History   Tobacco Use  Smoking Status Never Smoker  Smokeless Tobacco Never Used   Counseling given: Not Answered   Outpatient Medications Prior to Visit  Medication Sig Dispense Refill  . albuterol (VENTOLIN HFA) 108 (90 Base) MCG/ACT inhaler Inhale 1 puff into the lungs every 6 (six) hours as needed for wheezing or shortness of breath. 18 g 1  . amLODipine (NORVASC) 5 MG tablet Take 1 tablet (5 mg total) by mouth daily. 90 tablet 3  . aspirin EC 81 MG tablet Take 81 mg by mouth every morning.     Marland Kitchen atorvastatin (LIPITOR) 80 MG tablet TAKE 1 TABLET BY MOUTH ONCE A DAY AT [facility] THE EVENING. 90 tablet 3    . Calcium-Magnesium-Vitamin D (CALCIUM 1200+D3 PO) Take 1,200 mg by mouth.    . Cholecalciferol (VITAMIN D3) 1000 UNITS CAPS Take 1 capsule by mouth every morning.     . diclofenac sodium (VOLTAREN) 1 % GEL Apply 4 g topically 4 (four) times daily. To affected joint. 100 g 2  . fluticasone (FLONASE) 50 MCG/ACT nasal spray Place 1 spray into both nostrils as needed.     . furosemide (LASIX) 20 MG tablet Take 1 tablet (20 mg total) by mouth daily as needed. 90 tablet 3  . gabapentin (NEURONTIN) 300 MG capsule Take 1 capsule (300 mg total) by mouth 3 (three) times daily. 270 capsule 1  . isosorbide mononitrate (IMDUR) 30 MG 24 hr tablet Take 1 tablet (30 mg total) by mouth daily. 90 tablet 3  . loratadine (CLARITIN) 10 MG tablet Take 1 tablet (10 mg total) by mouth every morning. 30 tablet 2  . losartan (COZAAR) 100 MG tablet TAKE 1 TABLET BY MOUTH ONCE A DAY 90 tablet 3  . metoprolol succinate (TOPROL-XL) 25 MG 24 hr tablet Take 1 tablet (25 mg total) by mouth daily. 90 tablet 3  . Multiple Vitamin (MULTIVITAMIN) tablet Take 1 tablet by mouth every morning.     . nitroGLYCERIN (NITROSTAT) 0.4 MG SL tablet Place 1 tablet (0.4 mg total) under the tongue every 5 (five) minutes as needed for chest pain. 25 tablet 3  . Omega-3 Fatty Acids (FISH OIL) 1200 MG CAPS Take 1 capsule by mouth every morning.     Marland Kitchen omeprazole (PRILOSEC) 40 MG capsule Take 40 mg by mouth daily.     . potassium chloride SA (KLOR-CON) 20 MEQ tablet TAKE 2 TABLETS BY MOUTH TWICE DAILY 360 tablet 3  . tiZANidine (ZANAFLEX) 4 MG tablet Take 0.5-1 tablets (2-4 mg total) by mouth as needed. 30 tablet 1  . traMADol (ULTRAM) 50 MG tablet Take 1 tablet (50 mg total) by mouth every 6 (six) hours as needed for severe pain. 90 tablet 1  . vitamin B-12 (CYANOCOBALAMIN) 100 MCG tablet Take 1 tablet (100 mcg total) by mouth daily.    . hydrochlorothiazide (HYDRODIURIL) 25 MG tablet Take 1 tablet (25 mg total) by mouth daily. 90 tablet 3   No  facility-administered medications prior to visit.     Review of Systems:   Constitutional:   No  weight loss, night sweats,  Fevers, chills,  +fatigue, or  lassitude.  HEENT:   No headaches,  Difficulty swallowing,  Tooth/dental problems, or  Sore throat,                No sneezing, itching, ear ache, nasal congestion, post nasal drip,   CV:  No chest pain,  Orthopnea, PND, swelling in lower extremities, anasarca, dizziness, palpitations, syncope.   GI  No  heartburn, indigestion, abdominal pain, nausea, vomiting, diarrhea, change in bowel habits, loss of appetite, bloody stools.   Resp:  .  No chest wall deformity  Skin: no rash or lesions.  GU: no dysuria, change in color of urine, no urgency or frequency.  No flank pain, no hematuria   MS:  No joint pain or swelling.  No decreased range of motion.  No back pain.    Physical Exam  BP 124/62 (BP Location: Right Arm, Cuff Size: Normal)   Pulse 66   Temp 98.1 F (36.7 C) (Oral)   Ht 5' 2"  (1.575 m)   Wt 158 lb 9.6 oz (71.9 kg)   LMP  (LMP Unknown)   SpO2 96%   BMI 29.01 kg/m   GEN: A/Ox3; pleasant , NAD   HEENT:  [place]/AT,  EACs-clear, TMs-wnl, NOSE-clear, THROAT-clear, no lesions, no postnasal drip or exudate noted.   NECK:  Supple w/ fair ROM; no JVD; normal carotid impulses w/o bruits; no thyromegaly or nodules palpated; no lymphadenopathy.    RESP  Clear  P & A; w/o, wheezes/ rales/ or rhonchi. no accessory muscle use, no dullness to percussion  CARD:  RRR, no m/r/g, no peripheral edema, pulses intact, no cyanosis or clubbing.  GI:   Soft & nt; nml bowel sounds; no organomegaly or masses detected.   Musco: Warm bil, no deformities or joint swelling noted.   Neuro: alert, no focal deficits noted.    Skin: Warm, no lesions or rashes    Lab Results:  CBC    Component Value Date/Time   WBC 11.0 (H) 01/25/2020 1631   WBC 10.3 07/23/2019 0800   RBC 4.60 01/25/2020 1631   RBC 4.81 07/23/2019 0800   HGB 12.9  01/25/2020 1631   HCT 38.7 01/25/2020 1631   PLT 379 01/25/2020 1631   MCV 84 01/25/2020 1631   MCH 28.0 01/25/2020 1631   MCH 26.0 08/21/2017 1127   MCHC 33.3 01/25/2020 1631   MCHC 32.4 07/23/2019 0800   RDW 13.7 01/25/2020 1631   LYMPHSABS 3.0 07/23/2019 0800   MONOABS 0.7 07/23/2019 0800   EOSABS 0.2 07/23/2019 0800   BASOSABS 0.0 07/23/2019 0800    BMET    Component Value Date/Time   NA 141 11/09/2019 0844   K 3.5 11/09/2019 0844   CL 102 11/09/2019 0844   CO2 24 11/09/2019 0844   GLUCOSE 113 (H) 11/09/2019 0844   GLUCOSE 111 (H) 07/23/2019 0800   BUN 11 11/09/2019 0844   CREATININE 0.98 11/09/2019 0844   CALCIUM 9.2 11/09/2019 0844   GFRNONAA 58 (L) 11/09/2019 0844   GFRAA 67 11/09/2019 0844    BNP    Component Value Date/Time   BNP 31.2 08/21/2017 2058    ProBNP    Component Value Date/Time   PROBNP 21.0 07/26/2018 1053    Imaging: MYOCARDIAL PERFUSION IMAGING  Result Date: 01/24/2020  The left ventricular ejection fraction is hyperdynamic (>65%).  Nuclear stress EF: 77%.  There was no ST segment deviation noted during stress.  The study is normal.  This is a low risk study.  Low risk stress nuclear study with normal perfusion and normal left ventricular regional and global systolic function.  ECHOCARDIOGRAM COMPLETE  Result Date: 01/24/2020    ECHOCARDIOGRAM REPORT   Patient Name:   CITLALI GAUTNEY Date of Exam: 01/24/2020 Medical Rec #:  [id number]     Height:       62.0 in Accession #:    [id number]  Weight:       159.0 lb Date of Birth:  Sep 29, 1950     BSA:          1.734 m Patient Age:    33 years      BP:           166/84 mmHg Patient Gender: F             HR:           57 bpm. Exam Location:  [place] Procedure: 2D Echo, Cardiac Doppler and Color Doppler Indications:    R06.00 Dyspnea  History:        Patient has prior history of Echocardiogram examinations. STEMI                 and CAD, Mitral Valve Prolapse; Risk Factors:Hypertension.   Sonographer:    Marygrace Drought RCS Referring Phys: [place]  1. Left ventricular ejection fraction, by estimation, is 60 to 65%. The left ventricle has normal function. The left ventricle has no regional wall motion abnormalities. Left ventricular diastolic parameters were normal.  2. Right ventricular systolic function is normal. The right ventricular size is normal.  3. The mitral valve is normal in structure. Trivial mitral valve regurgitation. No evidence of mitral stenosis.  4. The aortic valve is tricuspid. Aortic valve regurgitation is not visualized. No aortic stenosis is present. FINDINGS  Left Ventricle: Left ventricular ejection fraction, by estimation, is 60 to 65%. The left ventricle has normal function. The left ventricle has no regional wall motion abnormalities. The left ventricular internal cavity size was normal in size. There is  no left ventricular hypertrophy. Left ventricular diastolic parameters were normal. Right Ventricle: The right ventricular size is normal. No increase in right ventricular wall thickness. Right ventricular systolic function is normal. Left Atrium: Left atrial size was normal in size. Right Atrium: Right atrial size was normal in size. Pericardium: There is no evidence of pericardial effusion. Mitral Valve: The mitral valve is normal in structure. Trivial mitral valve regurgitation. No evidence of mitral valve stenosis. Tricuspid Valve: The tricuspid valve is normal in structure. Tricuspid valve regurgitation is not demonstrated. No evidence of tricuspid stenosis. Aortic Valve: The aortic valve is tricuspid. . There is mild thickening and mild calcification of the aortic valve. Aortic valve regurgitation is not visualized. No aortic stenosis is present. There is mild thickening of the aortic valve. There is mild calcification of the aortic valve. Pulmonic Valve: The pulmonic valve was grossly normal. Pulmonic valve regurgitation is trivial. No  evidence of pulmonic stenosis. Aorta: The aortic root and ascending aorta are structurally normal, with no evidence of dilitation. IAS/Shunts: The atrial septum is grossly normal.  LEFT VENTRICLE PLAX 2D LVIDd:         4.53 cm  Diastology LVIDs:         2.40 cm  LV e' lateral:   7.94 cm/s LV PW:         1.03 cm  LV E/e' lateral: 12.7 LV IVS:        0.94 cm  LV e' medial:    5.33 cm/s LVOT diam:     2.00 cm  LV E/e' medial:  18.9 LV SV:         85 LV SV Index:   49 LVOT Area:     3.14 cm  RIGHT VENTRICLE RV Basal diam:  3.46 cm RV S prime:     10.60 cm/s TAPSE (M-mode): 2.1  cm LEFT ATRIUM             Index       RIGHT ATRIUM          Index LA diam:        3.40 cm 1.96 cm/m  RA Area:     8.79 cm LA Vol (A2C):   26.5 ml 15.28 ml/m RA Volume:   15.20 ml 8.77 ml/m LA Vol (A4C):   26.7 ml 15.40 ml/m LA Biplane Vol: 29.3 ml 16.90 ml/m  AORTIC VALVE LVOT Vmax:   97.50 cm/s LVOT Vmean:  65.300 cm/s LVOT VTI:    0.270 m  AORTA Ao Root diam: 2.80 cm MITRAL VALVE MV Area (PHT):              SHUNTS MV Decel Time:              Systemic VTI:  0.27 m MV E velocity: 101.00 cm/s  Systemic Diam: 2.00 cm MV A velocity: 96.00 cm/s MV E/A ratio:  1.05 Mertie Moores MD Electronically signed by Mertie Moores MD Signature Date/Time: 01/24/2020/11:38:09 AM    Final     regadenoson (LEXISCAN) injection SOLN 0.4 mg    Date Action Dose Route User   01/24/2020 0904 Given  Intravenous Hornowski Earl Many    technetium sestamibi generic (CARDIOLITE) injection 73.5 millicurie    Date Action Dose Route User   01/24/2020 0904 Contrast Given  Intravenous Hornowski Kubak, Elzbieta    technetium tetrofosmin (TC-MYOVIEW) injection 67.0 millicurie    Date Action Dose Route User   01/24/2020 0721 Contrast Given  Intravenous Nadara Eaton, Luz Brazen      PFT Results Latest Ref Rng & Units 09/06/2017  FVC-Pre L 2.53  FVC-Predicted Pre % 90  FVC-Post L 2.36  FVC-Predicted Post % 84  Pre FEV1/FVC % % 82  Post FEV1/FCV % % 86    FEV1-Pre L 2.08  FEV1-Predicted Pre % 98  FEV1-Post L 2.04  DLCO UNC% % 76  DLCO COR %Predicted % 86  TLC L 4.61  TLC % Predicted % 97  RV % Predicted % 92    No results found for: NITRICOXIDE      Assessment & Plan:   No problem-specific Assessment & Plan notes found for this encounter.     Rexene Edison, NP 02/08/2020

## 2020-02-08 NOTE — Assessment & Plan Note (Addendum)
Progressive shortness of breath x3 months questionable etiology.  May have a component of reactive airways although no significant cough or wheezing.  She does have some shortness of breath and tightness.  Cardiac evaluation has been unrevealing with normal 2D echo and stress test. We will give an empiric trial of Breo.  Walk test without desaturation today in the office. Check chest x-ray Case reviewed with Dr. Lamonte Sakai   Plan  Patient Instructions  Chest xray today .  Begin BREO 1 puff daily , rinse after use.  Return in 2 weeks with pre/post spirometry  Follow up with Dr. Lamonte Sakai  In 2 weeks and As needed   Please contact office for sooner follow up if symptoms do not improve or worsen or seek emergency care

## 2020-02-08 NOTE — Patient Instructions (Addendum)
Chest xray today .  Begin BREO 1 puff daily , rinse after use.  Return in 2 weeks with pre/post spirometry  Follow up with Dr. Lamonte Sakai  In 2 weeks and As needed   Please contact office for sooner follow up if symptoms do not improve or worsen or seek emergency care

## 2020-02-19 ENCOUNTER — Other Ambulatory Visit (HOSPITAL_COMMUNITY)
Admission: RE | Admit: 2020-02-19 | Discharge: 2020-02-19 | Disposition: A | Discharge status: Home / Self Care | Payer: PPO | Source: Ambulatory Visit | Attending: Emergency Medicine | Admitting: Emergency Medicine

## 2020-02-19 DIAGNOSIS — Z01812 Encounter for preprocedural laboratory examination: Secondary | ICD-10-CM | POA: Insufficient documentation

## 2020-02-19 DIAGNOSIS — Z20822 Contact with and (suspected) exposure to covid-19: Secondary | ICD-10-CM | POA: Diagnosis not present

## 2020-02-19 LAB — SARS CORONAVIRUS 2 (TAT 6-24 HRS): SARS Coronavirus 2: NEGATIVE

## 2020-02-21 ENCOUNTER — Other Ambulatory Visit: Payer: Self-pay | Admitting: *Deleted

## 2020-02-21 DIAGNOSIS — R0602 Shortness of breath: Secondary | ICD-10-CM

## 2020-02-22 ENCOUNTER — Other Ambulatory Visit: Payer: PPO

## 2020-02-22 ENCOUNTER — Other Ambulatory Visit: Payer: Self-pay

## 2020-02-22 DIAGNOSIS — R0602 Shortness of breath: Secondary | ICD-10-CM

## 2020-03-03 ENCOUNTER — Encounter: Payer: Self-pay | Admitting: *Deleted

## 2020-03-06 ENCOUNTER — Ambulatory Visit (INDEPENDENT_AMBULATORY_CARE_PROVIDER_SITE_OTHER): Payer: PPO | Admitting: Adult Health

## 2020-03-06 ENCOUNTER — Other Ambulatory Visit: Payer: Self-pay

## 2020-03-06 ENCOUNTER — Other Ambulatory Visit: Payer: Self-pay | Admitting: Adult Health

## 2020-03-06 ENCOUNTER — Encounter: Payer: Self-pay | Admitting: Adult Health

## 2020-03-06 DIAGNOSIS — R0602 Shortness of breath: Secondary | ICD-10-CM | POA: Diagnosis not present

## 2020-03-06 DIAGNOSIS — J4520 Mild intermittent asthma, uncomplicated: Secondary | ICD-10-CM

## 2020-03-06 MED ORDER — BREO ELLIPTA 100-25 MCG/INH IN AEPB: 1.0000 | INHALATION_SPRAY | Freq: Every day | RESPIRATORY_TRACT | 5 refills | Status: DC

## 2020-03-06 NOTE — Progress Notes (Signed)
Virtual Visit via Telephone Note  I connected with Hannah Vasquez on 03/06/20 at  9:00 AM EDT by telephone and verified that I am speaking with the correct person using two identifiers.  Location: Patient: Home  Provider: Office    I discussed the limitations, risks, security and privacy concerns of performing an evaluation and management service by telephone and the availability of in person appointments. I also discussed with the patient that there may be a patient responsible charge related to this service. The patient expressed understanding and agreed to proceed.   History of Present Illness: 69 year old female never smoker seen for pulmonary consult in 2019 for shortness of breath. Medical history significant for coronary disease status post MI in 2018 followed by cardiology  Today's telemedicine visit is for a 1 month follow-up for dyspnea.  Patient returned to the office February 08, 2020 for increased shortness of breath and extreme fatigue for 3 months.. She was referred to cardiology.  2D echo and stress Lexiscan were unrevealing.  Patient had some intermittent wheezing decreased activity t tolerance low energy level and fatigue.  This began around March 2021.  Prior to this said that she was able to do her housework be active and independent.  And over the last several months had noticed a significant decline.  Prior to the COVID-19 pandemic patient was going to cardiac rehab.  She did become less active with the pandemic. She also had lab work in June that showed a normal CBC TSH T4 T3.  And a normal IgG, anti-smooth muscle, ANA, mitochondrial antibody and smooth muscle antibody were negative.  Walk test in the office last visit showed no desaturations on room air. Patient was felt to possibly have a component of reactive airways or mild asthma at last visit.  She was started on Breo.  Patient says she has had clinical improvement and feels that she is starting get back to her baseline.   Her activity tolerance has improved and her activity level is also increased.  She does not feel as short of breath either.  She had pre and post spirometry which was done on February 22, 2020 that showed normal lung function with no airflow obstruction or restriction.  FEV1 was 93% ratio 81 and FVC 87%. Chest x-ray at last visit showed no acute process and clear lungs.   Observations/Objective: Spirometry 2019 normal, negative bronchodilator response.  Diffusing capacity was slightly decreased but corrected to normal range when adjusted for alveolar volume.  Assessment and Plan: Mild intermittent asthma with perceived clinical benefit on Breo.  Spirometry shows normal lung function. Chest x-ray was unrevealing Continue on Breo daily, rinse after use.   Dyspnea suspect is multifactorial with a component of deconditioning.  Cardiac work-up is unrevealing.  Pre and post spirometry shows normal lung function with no airflow obstruction or restriction.  Chest x-ray is clear.  Earlier lab work this year was reported as unrevealing. Patient's activity level and tolerance is improving slowly.  She is getting ready to restart cardiac rehab. Patient is to follow back up in 3 to 4 months and as needed  Plan  Patient Instructions  Continue BREO 1 puff daily , rinse after use.   Follow up with Dr. Lamonte Sakai  In 3 months and As needed   Please contact office for sooner follow up if symptoms do not improve or worsen or seek emergency care         Follow Up Instructions:    I discussed the  assessment and treatment plan with the patient. The patient was provided an opportunity to ask questions and all were answered. The patient agreed with the plan and demonstrated an understanding of the instructions.   The patient was advised to call back or seek an in-person evaluation if the symptoms worsen or if the condition fails to improve as anticipated.  I provided 22 minutes of non-face-to-face time during this  encounter.   Rexene Edison, NP

## 2020-03-06 NOTE — Patient Instructions (Signed)
Continue BREO 1 puff daily , rinse after use.   Follow up with Dr. Lamonte Sakai  In 3 months and As needed   Please contact office for sooner follow up if symptoms do not improve or worsen or seek emergency care

## 2020-03-14 ENCOUNTER — Other Ambulatory Visit (INDEPENDENT_AMBULATORY_CARE_PROVIDER_SITE_OTHER): Payer: PPO

## 2020-03-14 ENCOUNTER — Ambulatory Visit: Payer: PPO | Admitting: Cardiology

## 2020-03-14 ENCOUNTER — Encounter: Payer: Self-pay | Admitting: Internal Medicine

## 2020-03-14 ENCOUNTER — Ambulatory Visit: Payer: PPO | Admitting: Internal Medicine

## 2020-03-14 VITALS — BP 124/66 | HR 72 | Ht 61.61 in | Wt 157.4 lb

## 2020-03-14 DIAGNOSIS — R748 Abnormal levels of other serum enzymes: Secondary | ICD-10-CM

## 2020-03-14 DIAGNOSIS — R6881 Early satiety: Secondary | ICD-10-CM

## 2020-03-14 DIAGNOSIS — R1010 Upper abdominal pain, unspecified: Secondary | ICD-10-CM

## 2020-03-14 DIAGNOSIS — R110 Nausea: Secondary | ICD-10-CM

## 2020-03-14 LAB — CREATININE, SERUM: Creatinine, Ser: 0.91 mg/dL (ref 0.40–1.20)

## 2020-03-14 LAB — BUN: BUN: 11 mg/dL (ref 6–23)

## 2020-03-14 MED ORDER — OMEPRAZOLE 40 MG PO CPDR: 40.0000 mg | DELAYED_RELEASE_CAPSULE | Freq: Every day | ORAL | 3 refills | Status: DC

## 2020-03-14 NOTE — Patient Instructions (Signed)
Your provider has requested that you go to the basement level for lab work before leaving today. Press "B" on the elevator. The lab is located at the first door on the left as you exit the elevator. ______________________________________________________ We have sent the following medications to your pharmacy for you to pick up at your convenience: Omeprazole 40 mg once daily (increase from your previous over the counter dosage) _______________________________________________________ Dennis Bast have been scheduled for a follow up with Alonza Bogus, PA-C on 04/29/20 at 9:00 am. ________________________________________________________ Dennis Bast [facility]. Your appointment time is 11:40 am. Please arrive at 11:10 for registration purposes. Please make certain not to have anything to eat or drink 4 hours prior to your test. In addition, if you have any metal in your body, have a pacemaker or defibrillator, please be sure to let your ordering physician know. This test typically takes 45 minutes to 1 hour to complete. Should you need to reschedule, please call [phone] to do so. ____________________________________________________ If you are age 22 or older, your body mass index should be between 23-30. Your Body mass index is 29.15 kg/m. If this is out of the aforementioned range listed, please consider follow up with your Primary Care Provider.  If you are age 77 or younger, your body mass index should be between 19-25. Your Body mass index is 29.15 kg/m. If this is out of the aformentioned range listed, please consider follow up with your Primary Care Provider.  __________________________________________________ Due to recent changes in healthcare laws, you may see the results of your imaging and laboratory studies on MyChart before your provider has had a chance to review them.  We understand that in some cases there may be  results that are confusing or concerning to you. Not all laboratory results come back in the same time frame and the provider may be waiting for multiple results in order to interpret others.  Please give [place] 48 hours in order for your provider to thoroughly review all the results before contacting the office for clarification of your results.

## 2020-03-14 NOTE — Progress Notes (Signed)
 Subjective:    Patient ID: Hannah Vasquez, female    DOB: 08/06/1951, 69 y.o.   MRN: [id number]  HPI Hannah Vasquez is a 69-year-old female with a past medical history of CAD with prior MI in May 2018, hypertension, osteoporosis, GERD, and prior elevated alkaline phosphatase who is seen to evaluate upper abdominal pain and nausea as well as early satiety.  She is here today with her daughter.  She was last seen in the office in February 2021 to evaluate elevated alkaline phosphatase.  She reports that over the last 3 months she has developed an overall "sick feeling" and a predominantly nausea like sensation.  She has not had vomiting.  She is having early satiety and has not been able to complete full meals as she was previously.  Overall appetite has been decreased.  She is not having significant indigestion as long as she is taking Prilosec.  She is taking over-the-counter Prilosec 20 mg daily.  She was having an uncomfortable sensation in her epigastrium as well and this seems to come and go.  At times he can be more mid abdomen as well.  Eating does not definitively make the nausea better or worse nor the pain.  No new medications other than Breo which she is taking for chest tightness and dyspnea.  Breo has helped with this symptom.  She is not having chest pain.  She does take tramadol 3 to 4 tablets/day but this is been going on for at least a few years.  Bowel movements have not changed and she denies diarrhea, constipation, blood in stool or melena.  Her weight has been stable.  Review of Systems As per HPI, otherwise negative  Current Medications, Allergies, Past Medical History, Past Surgical History, Family History and Social History were reviewed in [place] [facility] electronic medical record.     Objective:   Physical Exam BP 124/66 (BP Location: Left Arm, Patient Position: Sitting, Cuff Size: Normal)   Pulse 72   Ht 5' 1.61" (1.565 m) Comment: height measured without shoes  Wt 157 lb  6 oz (71.4 kg)   LMP  (LMP Unknown)   BMI 29.15 kg/m  Gen: awake, alert, NAD HEENT: anicteric CV: RRR, no mrg Pulm: CTA b/l Abd: soft, NT/ND, +BS throughout Ext: no c/c/e Neuro: nonfocal  CMP     Component Value Date/Time   NA 141 11/09/2019 0844   K 3.5 11/09/2019 0844   CL 102 11/09/2019 0844   CO2 24 11/09/2019 0844   GLUCOSE 113 (H) 11/09/2019 0844   GLUCOSE 111 (H) 07/23/2019 0800   BUN 11 03/14/2020 1024   BUN 11 11/09/2019 0844   CREATININE 0.91 03/14/2020 1024   CALCIUM 9.2 11/09/2019 0844   PROT 7.1 01/16/2020 1010   PROT 6.7 07/02/2019 1104   ALBUMIN 4.0 01/16/2020 1010   ALBUMIN 4.2 07/02/2019 1104   AST 30 01/16/2020 1010   ALT 22 01/16/2020 1010   ALKPHOS 117 01/16/2020 1010   ALKPHOS 117 01/16/2020 1010   BILITOT 0.6 01/16/2020 1010   BILITOT 0.8 07/02/2019 1104   GFRNONAA 58 (L) 11/09/2019 0844   GFRAA 67 11/09/2019 0844         Assessment & Plan:  69-year-old female with a past medical history of CAD with prior MI in May 2018, hypertension, osteoporosis, GERD, and prior elevated alkaline phosphatase who is seen to evaluate upper abdominal pain and nausea as well as early satiety.  1.  Nausea/epigastric pain/early satiety/history of elevated alkaline phosphatase--I   initially recommended upper endoscopy today, however she is very anxious about endoscopic procedure.  2 weeks after her last endoscopic procedures performed at [facility] she had her heart attack.  She has always wondered in the back of her mind if the 2 are related though she understands that it may not have been.  Because of her apprehension with endoscopy we will evaluate this otherwise as follows: --Increase omeprazole to 40 mg daily to see if symptoms improve --MRI abdomen with and without contrast plus MRCP; this will evaluate abdomen and hepatobiliary anatomy.  She does have a history of elevated alk phosphatase though most recently this had returned to normal. --She understands that if  the MRI abdomen is unrevealing and symptoms do not improve with increased dose of PPI that my next recommendation would be for upper endoscopy.  I encouraged her to discuss potential upper endoscopy with Dr. Skains, her cardiologist when she sees him next week.  2.  CRC screening --normal in 2018, consider repeat in 2028  30 minutes total spent today including patient facing time, coordination of care, reviewing medical history/procedures/pertinent radiology studies, and documentation of the encounter.  

## 2020-03-18 ENCOUNTER — Encounter: Payer: Self-pay | Admitting: Cardiology

## 2020-03-18 ENCOUNTER — Ambulatory Visit: Payer: PPO | Admitting: Cardiology

## 2020-03-18 ENCOUNTER — Other Ambulatory Visit: Payer: Self-pay

## 2020-03-18 VITALS — BP 140/72 | HR 76 | Ht 61.0 in | Wt 156.0 lb

## 2020-03-18 DIAGNOSIS — R400 Somnolence: Secondary | ICD-10-CM

## 2020-03-18 DIAGNOSIS — I2510 Atherosclerotic heart disease of native coronary artery without angina pectoris: Secondary | ICD-10-CM

## 2020-03-18 DIAGNOSIS — R5383 Other fatigue: Secondary | ICD-10-CM

## 2020-03-18 DIAGNOSIS — E7800 Pure hypercholesterolemia, unspecified: Secondary | ICD-10-CM | POA: Diagnosis not present

## 2020-03-18 NOTE — Patient Instructions (Signed)
Medication Instructions:  The current medical regimen is effective;  continue present plan and medications.  *If you need a refill on your cardiac medications before your next appointment, please call your pharmacy*  Testing/Procedures: Your physician has recommended that you have a sleep study. This test records several body functions during sleep, including: brain activity, eye movement, oxygen and carbon dioxide blood levels, heart rate and rhythm, breathing rate and rhythm, the flow of air through your mouth and nose, snoring, body muscle movements, and chest and belly movement.  Follow-Up: At [facility], you and your health needs are our priority.  As part of our continuing mission to provide you with exceptional heart care, we have created designated Provider Care Teams.  These Care Teams include your primary Cardiologist (physician) and Advanced Practice Providers (APPs -  Physician Assistants and Nurse Practitioners) who all work together to provide you with the care you need, when you need it.  We recommend signing up for the patient portal called "MyChart".  Sign up information is provided on this After Visit Summary.  MyChart is used to connect with patients for Virtual Visits (Telemedicine).  Patients are able to view lab/test results, encounter notes, upcoming appointments, etc.  Non-urgent messages can be sent to your provider as well.   To learn more about what you can do with MyChart, go to [email or web address].    Your next appointment:   12 month(s)  The format for your next appointment:   In Person  Provider:   Candee Furbish, MD   Thank you for choosing [facility]!!

## 2020-03-18 NOTE — Progress Notes (Signed)
Cardiology Office Note:    Date:  03/18/2020   ID:  Hannah Vasquez, DOB September 11, 1950, MRN [id number]  PCP:  Lesleigh Noe, MD  [facility] Cardiologist:  Candee Furbish, MD  [facility] Electrophysiologist:  None   Referring MD: Lesleigh Noe, MD     History of Present Illness:    Hannah Vasquez is a 69 y.o. female with prior STEMI in May 2018 with 2 stents placed in the LAD.  EF at the time was 40% but then normalized by July 2018.  Overall has been doing fairly well.  Has had some chronic back issues.  Back in June 2021 she had a stress test to evaluate atypical chest discomfort.  Appreciate Dr. Angelena Form seeing her as DOD.  Her nuclear stress test was normal, no ischemia.  Past Medical History:  Diagnosis Date  . Allergy   . Anxiety   . CAD (coronary artery disease)   . Cataract   . Does use hearing aid    bilateral as of 10/05/2019  . Elevated alkaline phosphatase level   . Hemorrhoids   . Hepatic steatosis   . History of MI (myocardial infarction) 10/07/2017  . Hypertension   . Mitral valve prolapse   . Osteoporosis   . Status post dilation of esophageal narrowing     Past Surgical History:  Procedure Laterality Date  . BASAL CELL CARCINOMA EXCISION  02/05/2016   Dr. Sarajane Jews, [facility]  . BLADDER SURGERY  11/15/1995   bladder neck suspension, urinary incontinence  . CARDIAC CATHETERIZATION  01/03/2017 DES LAD  . CATARACT EXTRACTION Left 04/30/2016   Dr. Tommy Rainwater, [place] N/A 01/03/2017   Procedure: Coronary Stent Intervention;  Surgeon: Nelva Bush, MD;  Location: [place] [facility];  Service: Cardiovascular;  Laterality: N/A;  . DENTAL SURGERY  06/20/2016   Tooth Implant; Dr. Kalman Shan  . INTRAVASCULAR ULTRASOUND/IVUS N/A 01/03/2017   Procedure: Intravascular Ultrasound/IVUS;  Surgeon: Nelva Bush, MD;  Location: [place] [facility];  Service: Cardiovascular;  Laterality: N/A;  . KNEE SURGERY  2003   torn  meniscus  . LEFT HEART CATH AND CORONARY ANGIOGRAPHY N/A 01/03/2017   Procedure: Left Heart Cath and Coronary Angiography;  Surgeon: Nelva Bush, MD;  Location: [place] [facility];  Service: Cardiovascular;  Laterality: N/A;  . MOHS SURGERY Right 2017   right side of face  . ROTATOR CUFF REPAIR    . SHOULDER SURGERY Right 2005    Current Medications: Current Meds  Medication Sig  . albuterol (VENTOLIN HFA) 108 (90 Base) MCG/ACT inhaler Inhale 1 puff into the lungs every 6 (six) hours as needed for wheezing or shortness of breath.  Marland Kitchen amLODipine (NORVASC) 5 MG tablet Take 1 tablet (5 mg total) by mouth daily.  Marland Kitchen aspirin EC 81 MG tablet Take 81 mg by mouth every morning.   Marland Kitchen atorvastatin (LIPITOR) 80 MG tablet TAKE 1 TABLET BY MOUTH ONCE A DAY AT [facility] THE EVENING.  . Calcium-Magnesium-Vitamin D (CALCIUM 1200+D3 PO) Take 1,200 mg by mouth.  . Cholecalciferol (VITAMIN D3) 1000 UNITS CAPS Take 1 capsule by mouth every morning.   . diclofenac sodium (VOLTAREN) 1 % GEL Apply 4 g topically 4 (four) times daily. To affected joint.  . fluticasone (FLONASE) 50 MCG/ACT nasal spray Place 1 spray into both nostrils as needed.   . fluticasone furoate-vilanterol (BREO ELLIPTA) 100-25 MCG/INH AEPB Inhale 1 puff into the lungs daily.  . furosemide (LASIX) 20 MG tablet  Take 1 tablet (20 mg total) by mouth daily as needed.  . gabapentin (NEURONTIN) 300 MG capsule Take 1 capsule (300 mg total) by mouth 3 (three) times daily.  . hydrochlorothiazide (HYDRODIURIL) 25 MG tablet Take 1 tablet (25 mg total) by mouth daily.  . isosorbide mononitrate (IMDUR) 30 MG 24 hr tablet Take 1 tablet (30 mg total) by mouth daily.  Marland Kitchen loratadine (CLARITIN) 10 MG tablet Take 1 tablet (10 mg total) by mouth every morning.  Marland Kitchen losartan (COZAAR) 100 MG tablet TAKE 1 TABLET BY MOUTH ONCE A DAY  . metoprolol succinate (TOPROL-XL) 25 MG 24 hr tablet Take 1 tablet (25 mg total) by mouth daily.  . Multiple Vitamin (MULTIVITAMIN)  tablet Take 1 tablet by mouth every morning.   . nitroGLYCERIN (NITROSTAT) 0.4 MG SL tablet Place 1 tablet (0.4 mg total) under the tongue every 5 (five) minutes as needed for chest pain.  . Omega-3 Fatty Acids (FISH OIL) 1200 MG CAPS Take 1 capsule by mouth every morning.   Marland Kitchen omeprazole (PRILOSEC) 40 MG capsule Take 1 capsule (40 mg total) by mouth daily.  . potassium chloride SA (KLOR-CON) 20 MEQ tablet TAKE 2 TABLETS BY MOUTH TWICE DAILY  . tiZANidine (ZANAFLEX) 4 MG tablet Take 0.5-1 tablets (2-4 mg total) by mouth as needed.  . traMADol (ULTRAM) 50 MG tablet Take 1 tablet (50 mg total) by mouth every 6 (six) hours as needed for severe pain.  . vitamin B-12 (CYANOCOBALAMIN) 100 MCG tablet Take 1 tablet (100 mcg total) by mouth daily.     Allergies:   Bactroban [mupirocin calcium], Codeine, Penicillins, and Valtrex [valacyclovir hcl]   Social History   Socioeconomic History  . Marital status: Divorced    Spouse name: Not on file  . Number of children: 2  . Years of education: high school  . Highest education level: Not on file  Occupational History  . Occupation: Medical office---front desk    Comment: Retired  Tobacco Use  . Smoking status: Never Smoker  . Smokeless tobacco: Never Used  Vaping Use  . Vaping Use: Never used  Substance and Sexual Activity  . Alcohol use: No    Alcohol/week: 0.0 standard drinks  . Drug use: No  . Sexual activity: Not Currently    Birth control/protection: Post-menopausal  Other Topics Concern  . 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 Difficulty of Paying Living Expenses:   Food Insecurity:   . Worried About Charity fundraiser in the Last Year:   . Arboriculturist in the Last Year:   Transportation Needs:   . Film/video editor (Medical):   Marland Kitchen Lack of Transportation (Non-Medical):   Physical Activity:   . Days of Exercise per Week:   . Minutes of Exercise per Session:  Stress:   . Feeling of Stress :   Social Connections:   . Frequency of Communication with Friends and Family:   . Frequency of Social Gatherings with Friends and Family:   . Attends Religious Services:   . Active Member of Clubs or Organizations:   . Attends Archivist Meetings:   Marland Kitchen Marital Status:      Family History: The patient's family history includes Brain cancer in her father; Hypertension in her father and mother; Mitral valve prolapse in her mother.  ROS:   Please see the history of present illness.     All other systems reviewed and are negative.  EKGs/Labs/Other Studies Reviewed:    The following studies were reviewed today:  Lexiscan stress test 01/24/20:   The left ventricular ejection fraction is hyperdynamic (>65%).  Nuclear stress EF: 77%.  There was no ST segment deviation noted during stress.  The study is normal.  This is a low risk study.  Low risk stress nuclear study with normal perfusion and normal left ventricular regional and global systolic function.  Echocardiogram 01/24/20:  1. Left ventricular ejection fraction, by estimation, is 60 to 65%. The  left ventricle has normal function. The left ventricle has no regional  wall motion abnormalities. Left ventricular diastolic parameters were  normal.  2. Right ventricular systolic function is normal. The right ventricular  size is normal.  3. The mitral valve is normal in structure. Trivial mitral valve  regurgitation. No evidence of mitral  stenosis.  4. The aortic valve is tricuspid. Aortic valve regurgitation is not  visualized. No aortic stenosis is present.     Recent Labs: 11/09/2019: Potassium 3.5; Sodium 141 01/16/2020: ALT 22 01/25/2020: Hemoglobin 12.9; Platelets 379; TSH 2.400 03/14/2020: BUN 11; Creatinine, Ser 0.91  Recent Lipid Panel    Component Value Date/Time   CHOL 115 11/09/2019 0844   TRIG 123 11/09/2019 0844   HDL 30 (L) 11/09/2019 0844   CHOLHDL 3.8 11/09/2019 0844   CHOLHDL 6.3 01/02/2017 0144   VLDL 39 01/02/2017 0144   LDLCALC 63 11/09/2019 0844   LDLDIRECT 151.0 09/09/2011 0919    Physical Exam:    VS:  BP 140/72   Pulse 76   Ht 5' 1"  (1.549 m)   Wt 156 lb (70.8 kg)   LMP  (LMP Unknown)   SpO2 98%   BMI 29.48 kg/m     Wt Readings from Last 3 Encounters:  03/18/20 156 lb (70.8 kg)  03/14/20 157 lb 6 oz (71.4 kg)  02/08/20 158 lb 9.6 oz (71.9 kg)     GEN:  Well nourished, well developed in no acute distress HEENT: Normal NECK: No JVD; No carotid bruits LYMPHATICS: No lymphadenopathy CARDIAC: RRR, no murmurs, rubs, gallops RESPIRATORY:  Clear to auscultation without rales, wheezing or rhonchi  ABDOMEN: Soft, non-tender, non-distended MUSCULOSKELETAL:  No edema; No deformity  SKIN: Warm and dry NEUROLOGIC:  Alert and oriented x 3 PSYCHIATRIC:  Normal affect   ASSESSMENT:    1. Coronary artery disease involving native coronary artery of native heart without angina pectoris   2. Fatigue, unspecified type   3. Pure hypercholesterolemia   4. Daytime somnolence    PLAN:    In order of problems listed above:  Coronary artery disease -Anterior STEMI 12/2016 EF 40% which eventually normalized -PCI x2 to LAD in 2018, small diagonal branch jailed -Has had episodes of atypical chest discomfort -Pharmacologic stress test and echocardiogram were normal.    Fatigue/daytime somnolence -Still  has some persistent symptoms of fatigue. Wakes up exhausted. Rest a bit prior to leaving  the house.  -Lab work including thyroid work-up and CBC have been unremarkable -I will go ahead and check a home sleep study to evaluate for possible sleep apnea. -Currently on aspirin atorvastatin and Toprol Imdur -She misses cardiac rehab.  I encouraged her to continue with exercise efforts slowly at first then increasing.  SOB --Dr. Lamonte Sakai. Testing normal. Breo inhaler.  Dr. Raquel James. Increased GERD reg  Essential hypertension -Currently stable without any changes.  Mitral valve prolapse -Anterior leaflet trivial.  Overall excellent.   Medication Adjustments/Labs and Tests Ordered: Current medicines are reviewed at length with the patient today.  Concerns regarding medicines are outlined above.  Orders Placed This Encounter  Procedures  . Home sleep test   No orders of the defined types were placed in this encounter.   Patient Instructions  Medication Instructions:  The current medical regimen is effective;  continue present plan and medications.  *If you need a refill on your cardiac medications before your next appointment, please call your pharmacy*  Testing/Procedures: Your physician has recommended that you have a sleep study. This test records several body functions during sleep, including: brain activity, eye movement, oxygen and carbon dioxide blood levels, heart rate and rhythm, breathing rate and rhythm, the flow of air through your mouth and nose, snoring, body muscle movements, and chest and belly movement.  Follow-Up: At [facility], you and your health needs are our priority.  As part of our continuing mission to provide you with exceptional heart care, we have created designated Provider Care Teams.  These Care Teams include your primary Cardiologist (physician) and Advanced Practice Providers (APPs -  Physician Assistants and Nurse Practitioners) who all work together to provide you with the care you need, when you need it.  We recommend signing up for the  patient portal called "MyChart".  Sign up information is provided on this After Visit Summary.  MyChart is used to connect with patients for Virtual Visits (Telemedicine).  Patients are able to view lab/test results, encounter notes, upcoming appointments, etc.  Non-urgent messages can be sent to your provider as well.   To learn more about what you can do with MyChart, go to [email or web address].    Your next appointment:   12 month(s)  The format for your next appointment:   In Person  Provider:   Candee Furbish, MD   Thank you for choosing [facility]!!        Signed, Candee Furbish, MD  03/18/2020 5:20 PM    [place]

## 2020-03-19 ENCOUNTER — Telehealth: Payer: Self-pay | Admitting: *Deleted

## 2020-03-19 NOTE — Telephone Encounter (Signed)
Staff message sent to Gae Bon ok to schedule HST. [facility] does not require a PA for HST.

## 2020-03-24 ENCOUNTER — Telehealth: Payer: Self-pay | Admitting: *Deleted

## 2020-03-24 NOTE — Telephone Encounter (Signed)
-----   Message from Lauralee Evener, [place] sent at 03/19/2020 12:13 PM EDT ----- Regarding: RE: Home sleep study Ok to schedule HST. HTA does not require a PA for home testing. ----- Message ----- From: Shellia Cleverly, RN Sent: 03/18/2020   4:43 PM EDT To: [facility] Pre Cert/Auth, # Subject: Home sleep study                               Pt has been ordered to have a home sleep study.  Thank you

## 2020-03-24 NOTE — Telephone Encounter (Signed)
Patient is aware and agreeable to Home Sleep Study through [facility]. Patient is scheduled for 10/7 at 10:30 to pick up home sleep kit and meet with Respiratory therapist at [facility]. Patient is aware that if this appointment date and time does not work for them they should contact Artis Delay directly at [phone]. Patient is aware that a sleep packet will be sent from [facility] in week.  Left detailed message on voicemail with date and time of titration and informed patient to call back to confirm or reschedule.

## 2020-03-25 ENCOUNTER — Ambulatory Visit (INDEPENDENT_AMBULATORY_CARE_PROVIDER_SITE_OTHER): Payer: PPO | Admitting: Family Medicine

## 2020-03-25 ENCOUNTER — Encounter: Payer: Self-pay | Admitting: Family Medicine

## 2020-03-25 ENCOUNTER — Encounter: Payer: Self-pay | Admitting: Cardiology

## 2020-03-25 ENCOUNTER — Other Ambulatory Visit: Payer: Self-pay

## 2020-03-25 VITALS — BP 118/60 | HR 73 | Temp 97.4°F | Ht 62.0 in | Wt 157.5 lb

## 2020-03-25 DIAGNOSIS — G4700 Insomnia, unspecified: Secondary | ICD-10-CM

## 2020-03-25 DIAGNOSIS — R7309 Other abnormal glucose: Secondary | ICD-10-CM

## 2020-03-25 DIAGNOSIS — R5382 Chronic fatigue, unspecified: Secondary | ICD-10-CM

## 2020-03-25 DIAGNOSIS — R631 Polydipsia: Secondary | ICD-10-CM

## 2020-03-25 DIAGNOSIS — R351 Nocturia: Secondary | ICD-10-CM | POA: Diagnosis not present

## 2020-03-25 DIAGNOSIS — R7303 Prediabetes: Secondary | ICD-10-CM | POA: Diagnosis not present

## 2020-03-25 LAB — POCT GLYCOSYLATED HEMOGLOBIN (HGB A1C): Hemoglobin A1C: 5.7 % — AB (ref 4.0–5.6)

## 2020-03-25 NOTE — Telephone Encounter (Signed)
Error

## 2020-03-25 NOTE — Progress Notes (Signed)
Subjective:     Hannah Vasquez is a 69 y.o. female presenting for Follow-up (after seeing specialists ) and Fatigue (after exertion ie. taking a shower )     HPI  #Fatigue - with exertion - happens several times a week - will have good days and bad days - will happen when she wakes up in the morning - is waiting on the information for sleep study  Feels like the breo inhaler has helped  Endorses that she feels thirsty often  Sleep:  - goes to bed - 8 pm most days - takes 1 hour to fall asleep - wakes up - several times overnight, every 2 hours to use the bathroom - nocturia - 6 months - sometimes wakes up and stays awake for several hours - average nighttime waking for urination - 2 times  Feels like the fatigue dates back to last November  Endorses trouble falling asleep Took OTC sleep aid several years ago   Pain - hx of arthritis - started tramadol to help with pain - taking tramadol prn for pain - normal ANA  Review of Systems  03/18/2020: Cards - normal stress test 01/24/20, echo improved from 2018. Fatigue/daytime somnolence - planning sleep study. BP stable.   Breo inhaler w/ Dr. Lamonte Sakai. GERD with Dr. Raquel James  Social History   Tobacco Use  Smoking Status Never Smoker  Smokeless Tobacco Never Used        Objective:    BP Readings from Last 3 Encounters:  03/25/20 118/60  03/18/20 140/72  03/14/20 124/66   Wt Readings from Last 3 Encounters:  03/25/20 157 lb 8 oz (71.4 kg)  03/18/20 156 lb (70.8 kg)  03/14/20 157 lb 6 oz (71.4 kg)    BP 118/60   Pulse 73   Temp (!) 97.4 F (36.3 C) (Temporal)   Ht 5' 2"  (1.575 m)   Wt 157 lb 8 oz (71.4 kg)   LMP  (LMP Unknown)   SpO2 97%   BMI 28.81 kg/m    Physical Exam Constitutional:      General: She is not in acute distress.    Appearance: She is well-developed. She is not diaphoretic.  HENT:     Right Ear: External ear normal.     Left Ear: External ear normal.     Nose: Nose normal.    Eyes:     Conjunctiva/sclera: Conjunctivae normal.  Cardiovascular:     Rate and Rhythm: Normal rate.  Pulmonary:     Effort: Pulmonary effort is normal.  Musculoskeletal:     Cervical back: Neck supple.  Skin:    General: Skin is warm and dry.     Capillary Refill: Capillary refill takes less than 2 seconds.  Neurological:     Mental Status: She is alert. Mental status is at baseline.  Psychiatric:        Mood and Affect: Mood normal.        Behavior: Behavior normal.           Assessment & Plan:   Problem List Items Addressed This Visit      Other   Fatigue    Persistent fatigue. With some improvement with inhaler and reassuring cardiac work-up. Is planning for home sleep study but does also suffer from frequent nighttime wakening (see plan) and some insomnia (see plan). Work-up thus far reassuring. Did have ferritin <50 in 2019. Will send MyChart to see if she would like to do a trial of iron  supplement or wait and readdress after trying to treat insomnia.       Prediabetes    Improved from prior. Repeat study due to increased thirst and elevated glucose. Will watch and wait at this time, since improved over the last 3 years from 6.1.       Nocturia    Reports urination 2+ times a night. Will start with behavior modification - decreasing caffeine and stopping drinking 1 hour before bed. If persisting discussed urology referral to evaluate for complete bladder emptying      Insomnia    Difficulty falling asleep as well as nocturia and other nighttime wakening. Sleep apnea evaluation - in process. Will try to treat nocturia and sleep hygiene handout provided. Melatonin if still having issues after nocturia improves       Other Visit Diagnoses    Elevated glucose    -  Primary   Relevant Orders   POCT glycosylated hemoglobin (Hb A1C) (Completed)   Increased thirst       Relevant Orders   POCT glycosylated hemoglobin (Hb A1C) (Completed)       Return in about  3 months (around 06/25/2020).  Lesleigh Noe, MD  This visit occurred during the SARS-CoV-2 public health emergency.  Safety protocols were in place, including screening questions prior to the visit, additional usage of staff PPE, and extensive cleaning of exam room while observing appropriate contact time as indicated for disinfecting solutions.

## 2020-03-25 NOTE — Patient Instructions (Signed)
Help with Sleep  1) Switch to decaffeinated tea -- or water after 12 pm 2) Avoid drinking anything 1 hour before bed 3) Empty bladder before bed  -- Monitor for nighttime wakenings to urinate  -- if still waking up more than 2 times per night, then try to stop drinking 2 hours before bed -- if still waking up to urinate -- call or mychart and will refer to Urology  If still having difficulty with sleep - try taking 3-5 mg of Melatonin 1 hour before bedtime  Sleep hygiene checklist: 1. Avoid naps during the day 2. Avoid stimulants such as caffeine and nicotine. Avoid bedtime alcohol (it can speed onset of sleep but the body's metabolism can cause awakenings). At least 2 hours before bedtime 3. All forms of exercise help ensure sound sleep - limit vigorous exercise to morning or late afternoon 4. Avoid food too close to bedtime including chocolate (which contains caffeine) 5. Soak up natural light 6. Establish regular bedtime routine. 7. Associate bed with sleep - avoid TV, computer or phone, reading while in bed. 8. Ensure pleasant, relaxing sleep environment - quiet, dark, cool room.  Good Sleep Hygiene Habits -- Got to bed and wake up within an hour of the same time every day -- Avoid bright screens (from laptop, phone, TV) within at least 30 minutes before bed. The "blue light" supresses the sleep hormone melatonin and the content may stimulate as well -- Maintain a quiet and dark sleep environment (blackout curtains, turn on a fan or white noise to block out disruptive sounds) -- Practicing relaxing activites before bed (taking a shower, reading a book, journaling, meditation app) -- To quiet a busy mind -- consider journaling before bed (jotting down reminders, worry thoughts, as well as positive things like a gratitude list)   Begin a Mindfulness/Meditation practice -- this can take a little as 3 minutes -- You can find resources in books -- Or you can download apps like  ----  Headspace App (which currently has free content called "Weathering the Storm") ---- Calm (which has a few free options)  ---- Insignt Timer ---- Stop, Breathe & Think  # With each of these Apps - you should decline the "start free trial" offer and as you search through the App should be able to access some of their free content. You can also chose to pay for the content if you find one that works well for you.   # Many of them also offer sleep specific content which may help with insomnia

## 2020-03-25 NOTE — Assessment & Plan Note (Signed)
Reports urination 2+ times a night. Will start with behavior modification - decreasing caffeine and stopping drinking 1 hour before bed. If persisting discussed urology referral to evaluate for complete bladder emptying

## 2020-03-25 NOTE — Assessment & Plan Note (Signed)
Difficulty falling asleep as well as nocturia and other nighttime wakening. Sleep apnea evaluation - in process. Will try to treat nocturia and sleep hygiene handout provided. Melatonin if still having issues after nocturia improves

## 2020-03-25 NOTE — Assessment & Plan Note (Signed)
Improved from prior. Repeat study due to increased thirst and elevated glucose. Will watch and wait at this time, since improved over the last 3 years from 6.1.

## 2020-03-25 NOTE — Assessment & Plan Note (Signed)
Persistent fatigue. With some improvement with inhaler and reassuring cardiac work-up. Is planning for home sleep study but does also suffer from frequent nighttime wakening (see plan) and some insomnia (see plan). Work-up thus far reassuring. Did have ferritin <50 in 2019. Will send MyChart to see if she would like to do a trial of iron supplement or wait and readdress after trying to treat insomnia.

## 2020-03-28 ENCOUNTER — Encounter: Payer: Self-pay | Admitting: Family Medicine

## 2020-03-28 DIAGNOSIS — H04123 Dry eye syndrome of bilateral lacrimal glands: Secondary | ICD-10-CM | POA: Diagnosis not present

## 2020-03-28 DIAGNOSIS — H5213 Myopia, bilateral: Secondary | ICD-10-CM | POA: Diagnosis not present

## 2020-03-28 DIAGNOSIS — H26493 Other secondary cataract, bilateral: Secondary | ICD-10-CM | POA: Diagnosis not present

## 2020-03-28 DIAGNOSIS — Z9842 Cataract extraction status, left eye: Secondary | ICD-10-CM | POA: Diagnosis not present

## 2020-03-28 DIAGNOSIS — D3131 Benign neoplasm of right choroid: Secondary | ICD-10-CM | POA: Diagnosis not present

## 2020-03-28 DIAGNOSIS — Z9841 Cataract extraction status, right eye: Secondary | ICD-10-CM | POA: Diagnosis not present

## 2020-04-10 ENCOUNTER — Other Ambulatory Visit: Payer: Self-pay | Admitting: Internal Medicine

## 2020-04-12 ENCOUNTER — Other Ambulatory Visit: Payer: Self-pay

## 2020-04-12 ENCOUNTER — Ambulatory Visit
Admission: RE | Admit: 2020-04-12 | Discharge: 2020-04-12 | Disposition: A | Discharge status: Home / Self Care | Payer: PPO | Source: Ambulatory Visit | Attending: Internal Medicine | Admitting: Internal Medicine

## 2020-04-12 DIAGNOSIS — M47814 Spondylosis without myelopathy or radiculopathy, thoracic region: Secondary | ICD-10-CM | POA: Diagnosis not present

## 2020-04-12 DIAGNOSIS — R1010 Upper abdominal pain, unspecified: Secondary | ICD-10-CM

## 2020-04-12 DIAGNOSIS — I700 Atherosclerosis of aorta: Secondary | ICD-10-CM | POA: Diagnosis not present

## 2020-04-12 DIAGNOSIS — R110 Nausea: Secondary | ICD-10-CM

## 2020-04-12 DIAGNOSIS — M47816 Spondylosis without myelopathy or radiculopathy, lumbar region: Secondary | ICD-10-CM | POA: Diagnosis not present

## 2020-04-12 DIAGNOSIS — K449 Diaphragmatic hernia without obstruction or gangrene: Secondary | ICD-10-CM | POA: Diagnosis not present

## 2020-04-12 DIAGNOSIS — R6881 Early satiety: Secondary | ICD-10-CM

## 2020-04-12 DIAGNOSIS — R748 Abnormal levels of other serum enzymes: Secondary | ICD-10-CM

## 2020-04-12 MED ORDER — GADOBENATE DIMEGLUMINE 529 MG/ML IV SOLN
14.0000 mL | Freq: Once | INTRAVENOUS | Status: AC | PRN
  Administered 2020-04-12: 14 mL via INTRAVENOUS

## 2020-04-25 ENCOUNTER — Other Ambulatory Visit: Payer: Self-pay | Admitting: Nurse Practitioner

## 2020-04-26 ENCOUNTER — Ambulatory Visit (INDEPENDENT_AMBULATORY_CARE_PROVIDER_SITE_OTHER): Payer: PPO

## 2020-04-26 ENCOUNTER — Other Ambulatory Visit: Payer: Self-pay

## 2020-04-26 DIAGNOSIS — Z23 Encounter for immunization: Secondary | ICD-10-CM | POA: Diagnosis not present

## 2020-04-29 ENCOUNTER — Ambulatory Visit: Payer: PPO | Admitting: Gastroenterology

## 2020-04-29 ENCOUNTER — Encounter: Payer: Self-pay | Admitting: Gastroenterology

## 2020-04-29 VITALS — BP 122/62 | HR 72 | Ht 62.0 in | Wt 157.0 lb

## 2020-04-29 DIAGNOSIS — R1010 Upper abdominal pain, unspecified: Secondary | ICD-10-CM

## 2020-04-29 DIAGNOSIS — R6881 Early satiety: Secondary | ICD-10-CM | POA: Insufficient documentation

## 2020-04-29 DIAGNOSIS — R110 Nausea: Secondary | ICD-10-CM

## 2020-04-29 MED ORDER — OMEPRAZOLE 40 MG PO CPDR
40.0000 mg | DELAYED_RELEASE_CAPSULE | Freq: Two times a day (BID) | ORAL | 3 refills | Status: DC
Start: 2020-04-29 — End: 2021-01-19

## 2020-04-29 NOTE — Patient Instructions (Addendum)
If you are age 71 or older, your body mass index should be between 23-30. Your Body mass index is 28.72 kg/m. If this is out of the aforementioned range listed, please consider follow up with your Primary Care Provider.  If you are age 16 or younger, your body mass index should be between 19-25. Your Body mass index is 28.72 kg/m. If this is out of the aformentioned range listed, please consider follow up with your Primary Care Provider.   We have sent the following medications to your pharmacy for you to pick up at your convenience: Omeprazole 40 mg twice daily 30-60 minutes before breakfast and dinner.  Follow up in 8-10 weeks. Call office in early October to schedule appointment.   Due to recent changes in healthcare laws, you may see the results of your imaging and laboratory studies on MyChart before your provider has had a chance to review them.  We understand that in some cases there may be results that are confusing or concerning to you. Not all laboratory results come back in the same time frame and the provider may be waiting for multiple results in order to interpret others.  Please give [place] 48 hours in order for your provider to thoroughly review all the results before contacting the office for clarification of your results.

## 2020-04-29 NOTE — Progress Notes (Signed)
Addendum: Reviewed and agree with assessment and management plan. Marlin Brys M, MD  

## 2020-04-29 NOTE — Progress Notes (Signed)
04/29/2020 Hannah Vasquez [id number] 04/08/1951   HISTORY OF PRESENT ILLNESS:  This is a 69 year old female who is a patient of Dr. Vena Rua.  She was last seen here by Dr. Hilarie Fredrickson on 03/14/2020 (see that note for further details) at which time she was complaining of nausea, epigastric abdominal pain, and early satiety.  He recommended EGD but she was apprehensive so he ordered an MRI of the abdomen instead and increased her omeprazole from 20 mg daily to 40 mg daily.  She is here today for follow-up.  MRI essentially unremarkable for cause of symptoms.  Results as follows:  IMPRESSION: 1. Striking diffuse hepatic steatosis. 2. Small type 1 hiatal hernia. 3. Lower thoracic and lumbar spondylosis and degenerative disc disease. 4.  Aortic Atherosclerosis (ICD10-I70.0).  Says that she does feel like her symptoms are somewhat improved on omeprazole 40 mg daily, but not resolved.  Does not get epigastric pain as frequently.  She is still apprehensive about doing EGD and says that she is supposed to have a sleep study and some cataract type surgery in the near future.   Past Medical History:  Diagnosis Date  . Allergy   . Anxiety   . CAD (coronary artery disease)   . Cataract   . Does use hearing aid    bilateral as of 10/05/2019  . Elevated alkaline phosphatase level   . Hemorrhoids   . Hepatic steatosis   . History of MI (myocardial infarction) 10/07/2017  . Hypertension   . Mitral valve prolapse   . Osteoporosis   . Status post dilation of esophageal narrowing    Past Surgical History:  Procedure Laterality Date  . BASAL CELL CARCINOMA EXCISION  02/05/2016   Dr. Sarajane Jews, [facility]  . BLADDER SURGERY  11/15/1995   bladder neck suspension, urinary incontinence  . CARDIAC CATHETERIZATION  01/03/2017 DES LAD  . CATARACT EXTRACTION Left 04/30/2016   Dr. Tommy Rainwater, [place] N/A 01/03/2017   Procedure: Coronary Stent Intervention;   Surgeon: Nelva Bush, MD;  Location: [facility];  Service: Cardiovascular;  Laterality: N/A;  . DENTAL SURGERY  06/20/2016   Tooth Implant; Dr. Kalman Shan  . INTRAVASCULAR ULTRASOUND/IVUS N/A 01/03/2017   Procedure: Intravascular Ultrasound/IVUS;  Surgeon: Nelva Bush, MD;  Location: [place] [facility];  Service: Cardiovascular;  Laterality: N/A;  . KNEE SURGERY  2003   torn meniscus  . LEFT HEART CATH AND CORONARY ANGIOGRAPHY N/A 01/03/2017   Procedure: Left Heart Cath and Coronary Angiography;  Surgeon: Nelva Bush, MD;  Location: [place] [facility];  Service: Cardiovascular;  Laterality: N/A;  . MOHS SURGERY Right 2017   right side of face  . ROTATOR CUFF REPAIR    . SHOULDER SURGERY Right 2005    reports that she has never smoked. She has never used smokeless tobacco. She reports that she does not drink alcohol and does not use drugs. family history includes Brain cancer in her father; Hypertension in her father and mother; Mitral valve prolapse in her mother. Allergies  Allergen Reactions  . Bactroban [Mupirocin Calcium]     Facial redness and swelling  . Codeine     REACTION: nausea and vomiting  . Penicillins Nausea And Vomiting    REACTION: rash  . Valtrex [Valacyclovir Hcl] Swelling    Per pt causes redness      Outpatient Encounter Medications as of 04/29/2020  Medication Sig  . albuterol (VENTOLIN HFA) 108 (90 Base)  MCG/ACT inhaler Inhale 1 puff into the lungs every 6 (six) hours as needed for wheezing or shortness of breath.  Marland Kitchen amLODipine (NORVASC) 5 MG tablet Take 1 tablet (5 mg total) by mouth daily.  Marland Kitchen aspirin EC 81 MG tablet Take 81 mg by mouth every morning.   Marland Kitchen atorvastatin (LIPITOR) 80 MG tablet TAKE 1 TABLET BY MOUTH ONCE A DAY AT [facility] THE EVENING.  . Calcium-Magnesium-Vitamin D (CALCIUM 1200+D3 PO) Take 1,200 mg by mouth.  . Cholecalciferol (VITAMIN D3) 1000 UNITS CAPS Take 1 capsule by mouth every morning.   . diclofenac sodium (VOLTAREN) 1  % GEL Apply 4 g topically 4 (four) times daily. To affected joint.  . fluticasone (FLONASE) 50 MCG/ACT nasal spray Place 1 spray into both nostrils as needed.   . fluticasone furoate-vilanterol (BREO ELLIPTA) 100-25 MCG/INH AEPB Inhale 1 puff into the lungs daily.  . furosemide (LASIX) 20 MG tablet Take 1 tablet (20 mg total) by mouth daily as needed.  . gabapentin (NEURONTIN) 300 MG capsule Take 1 capsule (300 mg total) by mouth 3 (three) times daily.  . isosorbide mononitrate (IMDUR) 30 MG 24 hr tablet Take 1 tablet (30 mg total) by mouth daily.  Marland Kitchen loratadine (CLARITIN) 10 MG tablet Take 1 tablet (10 mg total) by mouth every morning.  Marland Kitchen losartan (COZAAR) 100 MG tablet TAKE 1 TABLET BY MOUTH ONCE A DAY  . metoprolol succinate (TOPROL-XL) 25 MG 24 hr tablet Take 1 tablet (25 mg total) by mouth daily.  . Multiple Vitamin (MULTIVITAMIN) tablet Take 1 tablet by mouth every morning.   . nitroGLYCERIN (NITROSTAT) 0.4 MG SL tablet Place 1 tablet (0.4 mg total) under the tongue every 5 (five) minutes as needed for chest pain.  . Omega-3 Fatty Acids (FISH OIL) 1200 MG CAPS Take 1 capsule by mouth every morning.   Marland Kitchen omeprazole (PRILOSEC) 40 MG capsule TAKE 1 CAPSULE BY MOUTH ONCE DAILY  . potassium chloride SA (KLOR-CON) 20 MEQ tablet TAKE 2 TABLETS BY MOUTH TWICE DAILY  . traMADol (ULTRAM) 50 MG tablet Take 1 tablet (50 mg total) by mouth every 6 (six) hours as needed for severe pain.  . vitamin B-12 (CYANOCOBALAMIN) 100 MCG tablet Take 1 tablet (100 mcg total) by mouth daily.  . hydrochlorothiazide (HYDRODIURIL) 25 MG tablet Take 1 tablet (25 mg total) by mouth daily.   No facility-administered encounter medications on file as of 04/29/2020.    REVIEW OF SYSTEMS  : All other systems reviewed and negative except where noted in the History of Present Illness.   PHYSICAL EXAM: BP 122/62   Pulse 72   Ht 5' 2"  (1.575 m)   Wt 157 lb (71.2 kg)   LMP  (LMP Unknown)   BMI 28.72 kg/m  General: Well  developed white female in no acute distress Head: Normocephalic and atraumatic Eyes:  Sclerae anicteric, conjunctiva pink. Ears: Normal auditory acuity Lungs: Clear throughout to auscultation; no increased WOB Heart: Regular rate and rhythm; no M/R/G. Abdomen: Soft, non-distended.  BS present.  Non-tender. Musculoskeletal: Symmetrical with no gross deformities  Skin: No lesions on visible extremities Extremities: No edema  Neurological: Alert oriented x 4, grossly non-focal Psychological:  Alert and cooperative. Normal mood and affect  ASSESSMENT AND PLAN: 69 year old female with a past medical history of CAD with prior MI in May 2018, hypertension, osteoporosis, GERD, and prior elevated alkaline phosphatase who is seen to evaluate upper abdominal pain and nausea as well as early satiety.  1.  Nausea/epigastric pain/early  satiety/history of elevated alkaline phosphatase--Upper endoscopy was recommended at her last visit with Dr. Hilarie Fredrickson, however, she was very anxious about endoscopic procedure.  2 weeks after her last endoscopic procedures performed at [facility] she had her heart attack.  She has always wondered in the back of her mind if the 2 are related though she understands that it may not have been.  Because of her apprehension with endoscopy they increased her omeprazole 40 mg daily (from 20 mg daily) and ordered MRI abdomen.  MRI was rather unrevealing as to the cause of her symptoms.  She says that symptoms are better but not gone, pain is less frequent.  She is still apprehensive about EGD.  Says that she has a lot of other medical stuff occurring/being evaluated right now.   --Increase omeprazole to 40 mg BID to see if symptoms improve further. --Follow-up in about 8 weeks with Dr. Hilarie Fredrickson or myself.  If symptoms still not further improved/resolved then next step would be EGD.   CC:  Lesleigh Noe, MD

## 2020-04-30 ENCOUNTER — Telehealth: Payer: Self-pay | Admitting: Gastroenterology

## 2020-04-30 NOTE — Telephone Encounter (Signed)
The pt has been advised to take her omeprazole twice daily 20-30 min prior to breakfast and dinner.  The pt has been advised of the information and verbalized understanding.   She will call if she has any further questions.

## 2020-05-13 DIAGNOSIS — Z961 Presence of intraocular lens: Secondary | ICD-10-CM | POA: Diagnosis not present

## 2020-05-13 DIAGNOSIS — H26492 Other secondary cataract, left eye: Secondary | ICD-10-CM | POA: Diagnosis not present

## 2020-05-13 DIAGNOSIS — H26493 Other secondary cataract, bilateral: Secondary | ICD-10-CM | POA: Diagnosis not present

## 2020-05-13 DIAGNOSIS — H18413 Arcus senilis, bilateral: Secondary | ICD-10-CM | POA: Diagnosis not present

## 2020-05-14 ENCOUNTER — Telehealth: Payer: Self-pay | Admitting: Cardiology

## 2020-05-14 NOTE — Telephone Encounter (Signed)
Spoke with the patient and advised her that she is to go to [facility] on 10/07 at 10:30am to meet with the respiratory therapist and pick up equipment for sleep study. Dr. Radford Pax will be reading her results and we will be in contact with her once they are reviewed with any recommendations and further follow up. Patient verbalized understanding.

## 2020-05-14 NOTE — Telephone Encounter (Signed)
Patient called and had questions about her upcomming sleep study. Please call and  address concerns

## 2020-05-22 ENCOUNTER — Ambulatory Visit (HOSPITAL_BASED_OUTPATIENT_CLINIC_OR_DEPARTMENT_OTHER): Payer: PPO | Attending: Cardiology | Admitting: Cardiology

## 2020-05-22 ENCOUNTER — Other Ambulatory Visit: Payer: Self-pay

## 2020-05-22 VITALS — Ht 62.0 in | Wt 155.0 lb

## 2020-05-22 DIAGNOSIS — R400 Somnolence: Secondary | ICD-10-CM | POA: Diagnosis present

## 2020-05-22 DIAGNOSIS — R0683 Snoring: Secondary | ICD-10-CM | POA: Diagnosis not present

## 2020-05-22 DIAGNOSIS — G4733 Obstructive sleep apnea (adult) (pediatric): Secondary | ICD-10-CM

## 2020-05-22 DIAGNOSIS — G4736 Sleep related hypoventilation in conditions classified elsewhere: Secondary | ICD-10-CM | POA: Insufficient documentation

## 2020-05-31 NOTE — Procedures (Signed)
   Patient Name: Hannah Vasquez, Hannah Vasquez Date: 05/22/2020 Gender: Female D.O.B: April 30, 1951 Age (years): 74 Referring Provider: Jerline Pain Height (inches): 62 Interpreting Physician: Fransico Him MD, ABSM Weight (lbs): 155 RPSGT: [place]er, Holly BMI: 28 MRN: [id number] Neck Size: 14.00  CLINICAL INFORMATION Sleep Study Type: HST  Indication for sleep study: N/A  Epworth Sleepiness Score: 5  SLEEP STUDY TECHNIQUE A multi-channel overnight portable sleep study was performed. The channels recorded were: nasal airflow, thoracic respiratory movement, and oxygen saturation with a pulse oximetry. Snoring was also monitored.  MEDICATIONS Patient self administered medications include: N/A.  SLEEP ARCHITECTURE Patient was studied for 506.6 minutes. The sleep efficiency was 100.0 % and the patient was supine for 21%. The arousal index was 0.0 per hour.  RESPIRATORY PARAMETERS The overall AHI was 18.7 per hour, with a central apnea index of 0.0 per hour.  The oxygen nadir was 78% during sleep.  CARDIAC DATA Mean heart rate during sleep was 59.0 bpm.  IMPRESSIONS - Moderate obstructive sleep apnea occurred during this study (AHI = 18.7/h). - No significant central sleep apnea occurred during this study (CAI = 0.0/h). - Severe oxygen desaturation was noted during this study (Min O2 = 78%). - Patient snored 5.2% during the sleep.  DIAGNOSIS - Obstructive Sleep Apnea (G47.33) - Nocturnal Hypoxemia (G47.36)  RECOMMENDATIONS - Recommend ResMed auto CPAP from 4 to 20cm H2O with heated humidity and mask of choice.  - Positional therapy avoiding supine position during sleep. - Avoid alcohol, sedatives and other CNS depressants that may worsen sleep apnea and disrupt normal sleep architecture. - Sleep hygiene should be reviewed to assess factors that may improve sleep quality. - Weight management and regular exercise should be initiated or continued. - Return to Sleep Center in 8  weeks.  [Electronically signed] 05/31/2020 09:25 PM  Fransico Him MD, ABSM Diplomate, American Board of Sleep Medicine

## 2020-06-02 ENCOUNTER — Telehealth: Payer: Self-pay | Admitting: Cardiology

## 2020-06-02 ENCOUNTER — Telehealth: Payer: Self-pay | Admitting: *Deleted

## 2020-06-02 NOTE — Telephone Encounter (Signed)
Informed patient of sleep study results and patient understanding was verbalized. Patient understands her sleep study showed they have sleep apnea and recommend auto CPAP titration through DME Orders have been placed in Epic. Please set 8 week OV with me.   Upon patient request DME selection is [facility]. Patient understands she will be contacted by [facility] to set up her cpap. Patient understands to call if [facility] does not contact her with new setup in a timely manner. Patient understands they will be called once confirmation has been received from [facility] that they have received their new machine to schedule 10 week follow up appointment.  [place] notified of new cpap order  Please add to airview Patient was grateful for the call and thanked me.

## 2020-06-02 NOTE — Telephone Encounter (Signed)
-----   Message from Sueanne Margarita, MD sent at 05/31/2020  9:29 PM EDT ----- Please let patient know that they have sleep apnea and recommend auto CPAP titration through DME  Orders have been placed in Epic. Please set 8 week OV with me.

## 2020-06-02 NOTE — Telephone Encounter (Signed)
Informed patient of sleep study results and patient understanding was verbalized. Patient understands her sleep study showed they have sleep apnea and recommend auto CPAP titration through DME Orders have been placed in Epic. Please set 8 week OV with me.   Upon patient request DME selection is [facility]. Patient understands she will be contacted by [facility] to set up her cpap. Patient understands to call if [facility] does not contact her with new setup in a timely manner. Patient understands they will be called once confirmation has been received from [facility] that they have received their new machine to schedule 10 week follow up appointment.  [place] notified of new cpap order  Please add to airview Patient was grateful for the call and thanked me.

## 2020-06-02 NOTE — Telephone Encounter (Signed)
Patient calling to get sleep study results.

## 2020-06-03 ENCOUNTER — Other Ambulatory Visit: Payer: Self-pay | Admitting: Family Medicine

## 2020-06-03 DIAGNOSIS — M5416 Radiculopathy, lumbar region: Secondary | ICD-10-CM

## 2020-06-03 DIAGNOSIS — H26491 Other secondary cataract, right eye: Secondary | ICD-10-CM | POA: Diagnosis not present

## 2020-06-03 DIAGNOSIS — Z961 Presence of intraocular lens: Secondary | ICD-10-CM | POA: Diagnosis not present

## 2020-06-03 DIAGNOSIS — G629 Polyneuropathy, unspecified: Secondary | ICD-10-CM

## 2020-06-05 ENCOUNTER — Telehealth: Payer: Self-pay | Admitting: Family Medicine

## 2020-06-05 MED ORDER — TRAMADOL HCL 50 MG PO TABS: 50.0000 mg | ORAL_TABLET | Freq: Four times a day (QID) | ORAL | 1 refills | Status: DC | PRN

## 2020-06-05 MED ORDER — GABAPENTIN 300 MG PO CAPS: 300.0000 mg | ORAL_CAPSULE | Freq: Three times a day (TID) | ORAL | 1 refills | Status: DC

## 2020-06-05 NOTE — Telephone Encounter (Signed)
Pt called in checking on prescription , she has an appointment on 06/25/20 and she is running out of meds.   Best number is  [phone]

## 2020-06-05 NOTE — Telephone Encounter (Signed)
Pt is requesting refills on Tramadol and Gabapentin. She is scheduled for an appt on 11/10. Last OV 03/25/2020.

## 2020-06-05 NOTE — Addendum Note (Signed)
Addended by: Waunita Schooner R on: 06/05/2020 01:27 PM   Modules accepted: Orders

## 2020-06-17 DIAGNOSIS — M545 Low back pain, unspecified: Secondary | ICD-10-CM | POA: Diagnosis not present

## 2020-06-17 DIAGNOSIS — M5126 Other intervertebral disc displacement, lumbar region: Secondary | ICD-10-CM | POA: Diagnosis not present

## 2020-06-17 DIAGNOSIS — M5416 Radiculopathy, lumbar region: Secondary | ICD-10-CM | POA: Diagnosis not present

## 2020-06-25 ENCOUNTER — Other Ambulatory Visit: Payer: Self-pay

## 2020-06-25 ENCOUNTER — Ambulatory Visit (INDEPENDENT_AMBULATORY_CARE_PROVIDER_SITE_OTHER): Payer: PPO | Admitting: Family Medicine

## 2020-06-25 VITALS — BP 120/70 | HR 61 | Temp 97.7°F | Ht 62.0 in | Wt 156.2 lb

## 2020-06-25 DIAGNOSIS — M5416 Radiculopathy, lumbar region: Secondary | ICD-10-CM | POA: Diagnosis not present

## 2020-06-25 DIAGNOSIS — I10 Essential (primary) hypertension: Secondary | ICD-10-CM

## 2020-06-25 DIAGNOSIS — G4733 Obstructive sleep apnea (adult) (pediatric): Secondary | ICD-10-CM | POA: Diagnosis not present

## 2020-06-25 MED ORDER — TRAMADOL HCL 50 MG PO TABS: 50.0000 mg | ORAL_TABLET | Freq: Four times a day (QID) | ORAL | 3 refills | Status: DC | PRN

## 2020-06-25 NOTE — Assessment & Plan Note (Signed)
Likely the cause of her fatigue. She is going to get a CPAP and will start using. Plan to f/u in 3-4 months after starting CPAP. If fatigue persists we will get some labs to assess.

## 2020-06-25 NOTE — Patient Instructions (Signed)
Return in 4 months for medicare wellness visit

## 2020-06-25 NOTE — Progress Notes (Signed)
Subjective:     Hannah Vasquez is a 69 y.o. female presenting for Follow-up (3 mo)     HPI  #Insomnia - diagnosed with sleep apnea - has not been able to start CPAP due to shortage   #Fatigue - is improving overall - not as bad as it was - is sleeping better now that she stopped drinking after 6 pm  - nocturia x 1 overnight - drinking more water  #chronic pain - has back/leg pain - getting ready to get injection - using tramadol 50 mg 2-3 times per day  #hiatal hernia/reflux - will get sick with certain things - her breathing difficulty is getting better with higher dose PPI  Review of Systems   Social History   Tobacco Use  Smoking Status Never Smoker  Smokeless Tobacco Never Used        Objective:    BP Readings from Last 3 Encounters:  06/25/20 120/70  04/29/20 122/62  03/25/20 118/60   Wt Readings from Last 3 Encounters:  06/25/20 156 lb 4 oz (70.9 kg)  05/22/20 155 lb (70.3 kg)  04/29/20 157 lb (71.2 kg)    BP 120/70   Pulse 61   Temp 97.7 F (36.5 C) (Temporal)   Ht 5' 2"  (1.575 m)   Wt 156 lb 4 oz (70.9 kg)   LMP  (LMP Unknown)   SpO2 96%   BMI 28.58 kg/m    Physical Exam Constitutional:      General: She is not in acute distress.    Appearance: She is well-developed. She is not diaphoretic.  HENT:     Right Ear: External ear normal.     Left Ear: External ear normal.     Nose: Nose normal.  Eyes:     Conjunctiva/sclera: Conjunctivae normal.  Cardiovascular:     Rate and Rhythm: Normal rate and regular rhythm.     Heart sounds: No murmur heard.   Pulmonary:     Effort: Pulmonary effort is normal. No respiratory distress.     Breath sounds: Normal breath sounds. No wheezing.  Musculoskeletal:     Cervical back: Neck supple.  Skin:    General: Skin is warm and dry.     Capillary Refill: Capillary refill takes less than 2 seconds.  Neurological:     Mental Status: She is alert. Mental status is at baseline.    Psychiatric:        Mood and Affect: Mood normal.        Behavior: Behavior normal.           Assessment & Plan:   Problem List Items Addressed This Visit      Cardiovascular and Mediastinum   HYPERTENSION, BENIGN - Primary    BP controlled. Cont Imdur 30 mg daily, HCTZ 25 mg daily, losartan 100 mg, metoprolol 25 mg, amlodipine 5 mg.         Respiratory   OSA (obstructive sleep apnea)    Likely the cause of her fatigue. She is going to get a CPAP and will start using. Plan to f/u in 3-4 months after starting CPAP. If fatigue persists we will get some labs to assess.         Nervous and Auditory   Lumbar radiculopathy    Worse recently. Continues to follow with NSG and is getting an injection soon. Has seemed to need more tramadol lately, but hopefully the injection will decrease use. Refill today Tramadol 50 mg up to TID,  though try to limit. Agree with PT after her injection      Relevant Medications   traMADol (ULTRAM) 50 MG tablet       Return in about 4 months (around 10/23/2020).  Lesleigh Noe, MD  This visit occurred during the SARS-CoV-2 public health emergency.  Safety protocols were in place, including screening questions prior to the visit, additional usage of staff PPE, and extensive cleaning of exam room while observing appropriate contact time as indicated for disinfecting solutions.

## 2020-06-25 NOTE — Assessment & Plan Note (Signed)
BP controlled. Cont Imdur 30 mg daily, HCTZ 25 mg daily, losartan 100 mg, metoprolol 25 mg, amlodipine 5 mg.

## 2020-06-25 NOTE — Assessment & Plan Note (Signed)
Worse recently. Continues to follow with NSG and is getting an injection soon. Has seemed to need more tramadol lately, but hopefully the injection will decrease use. Refill today Tramadol 50 mg up to TID, though try to limit. Agree with PT after her injection

## 2020-07-02 DIAGNOSIS — M5416 Radiculopathy, lumbar region: Secondary | ICD-10-CM | POA: Diagnosis not present

## 2020-07-02 DIAGNOSIS — M5126 Other intervertebral disc displacement, lumbar region: Secondary | ICD-10-CM | POA: Diagnosis not present

## 2020-07-17 DIAGNOSIS — M5126 Other intervertebral disc displacement, lumbar region: Secondary | ICD-10-CM | POA: Diagnosis not present

## 2020-07-17 DIAGNOSIS — M545 Low back pain, unspecified: Secondary | ICD-10-CM | POA: Diagnosis not present

## 2020-07-17 DIAGNOSIS — M5416 Radiculopathy, lumbar region: Secondary | ICD-10-CM | POA: Diagnosis not present

## 2020-07-21 ENCOUNTER — Other Ambulatory Visit: Payer: Self-pay

## 2020-07-21 MED ORDER — ISOSORBIDE MONONITRATE ER 30 MG PO TB24: 30.0000 mg | ORAL_TABLET | Freq: Every day | ORAL | 3 refills | Status: DC

## 2020-07-21 MED ORDER — HYDROCHLOROTHIAZIDE 25 MG PO TABS: 25.0000 mg | ORAL_TABLET | Freq: Every day | ORAL | 3 refills | Status: DC

## 2020-07-21 MED ORDER — METOPROLOL SUCCINATE ER 25 MG PO TB24: 25.0000 mg | ORAL_TABLET | Freq: Every day | ORAL | 3 refills | Status: DC

## 2020-07-25 DIAGNOSIS — M5126 Other intervertebral disc displacement, lumbar region: Secondary | ICD-10-CM | POA: Diagnosis not present

## 2020-07-25 DIAGNOSIS — M5416 Radiculopathy, lumbar region: Secondary | ICD-10-CM | POA: Diagnosis not present

## 2020-07-29 ENCOUNTER — Other Ambulatory Visit: Payer: PPO

## 2020-07-29 ENCOUNTER — Other Ambulatory Visit: Payer: Self-pay | Admitting: Family Medicine

## 2020-07-29 ENCOUNTER — Telehealth (INDEPENDENT_AMBULATORY_CARE_PROVIDER_SITE_OTHER): Payer: PPO | Admitting: Family Medicine

## 2020-07-29 ENCOUNTER — Other Ambulatory Visit (INDEPENDENT_AMBULATORY_CARE_PROVIDER_SITE_OTHER): Payer: PPO | Admitting: Family Medicine

## 2020-07-29 ENCOUNTER — Other Ambulatory Visit: Payer: Self-pay

## 2020-07-29 ENCOUNTER — Encounter: Payer: Self-pay | Admitting: Family Medicine

## 2020-07-29 VITALS — Ht 62.0 in | Wt 156.0 lb

## 2020-07-29 DIAGNOSIS — R059 Cough, unspecified: Secondary | ICD-10-CM

## 2020-07-29 LAB — POC INFLUENZA A&B (BINAX/QUICKVUE)
Influenza A, POC: NEGATIVE
Influenza B, POC: NEGATIVE

## 2020-07-29 MED ORDER — PROMETHAZINE-DM 6.25-15 MG/5ML PO SYRP
5.0000 mL | ORAL_SOLUTION | Freq: Every evening | ORAL | 0 refills | Status: DC | PRN
Start: 2020-07-29 — End: 2020-11-18

## 2020-07-29 NOTE — Patient Instructions (Signed)
Likely COVID19  infection vs other viral infection. No clear sign of bacterial infection at this time.  Recommend testing .Marland Kitchen we will call to set up. No SOB.  No red flags/need for ER visit or in-person exam at respiratory clinic at this time..    If SOB begins symptoms worsening.. have low threshold for in-person exam, if severe shortness of breath ER visit recommended.  Can monitor Oxygen saturation at home with home monitor if able to obtain.  Go to ER if O2 sat < 90% on room air.

## 2020-07-29 NOTE — Progress Notes (Signed)
VIRTUAL VISIT Due to national recommendations of social distancing due to [place] 19, a virtual visit is felt to be most appropriate for this patient at this time.   I connected with the patient on 07/29/20 at  9:20 AM EST by virtual telehealth platform and verified that I am speaking with the correct person using two identifiers.   I discussed the limitations, risks, security and privacy concerns of performing an evaluation and management service by  virtual telehealth platform and the availability of in person appointments. I also discussed with the patient that there may be a patient responsible charge related to this service. The patient expressed understanding and agreed to proceed.  Patient location: Home Provider Location: [facility] Participants: Eliezer Lofts and [name]   Chief Complaint  Patient presents with  . Acute Visit    Chest congestion, sore throat, headache, diarrhea, body aches, no fever x3 days    History of Present Illness:  69 year old female patient of Dr. Verda Cumins with history of  HTN, CAD, CHF, reactive airways,  OSA and allergies presents with new onset chest congestion.   She started feel bad on  07/26/2020 .Started runny nose, headache, upset stomach and chest congestion.  She has cough, dry. No SOB, no wheeze. Now with sore throat bilaterally, couigh and runny nose somewhat better today.  Temperature low grade 99 F. Occ diarrhea, no emeiis. Able to eat and keep up with liquids. Some body ache.  No loss of taste or smell, new.   Got epidural injection the day before.. felt lightheaded and weak afterward.  COVID vaccine: none COVID test: none FLU vaccine: 06/2020  No recent travel or known exposure to [place]. No know sick contact but at [facility].   The importance of social distancing was discussed today.   Review of Systems  Constitutional: Negative for chills and fever.  HENT: Positive for congestion and sore throat. Negative for ear pain.    Eyes: Negative for pain and redness.  Respiratory: Positive for cough. Negative for shortness of breath and wheezing.   Cardiovascular: Negative for chest pain, palpitations and leg swelling.  Gastrointestinal: Positive for diarrhea and nausea. Negative for abdominal pain, blood in stool, constipation and vomiting.  Genitourinary: Negative for dysuria.  Musculoskeletal: Negative for falls and myalgias.  Skin: Negative for rash.  Neurological: Positive for dizziness.  Psychiatric/Behavioral: Negative for depression. The patient is not nervous/anxious.       Past Medical History:  Diagnosis Date  . Allergy   . Anxiety   . CAD (coronary artery disease)   . Cataract   . Does use hearing aid    bilateral as of 10/05/2019  . Elevated alkaline phosphatase level   . Hemorrhoids   . Hepatic steatosis   . History of MI (myocardial infarction) 10/07/2017  . Hypertension   . Mitral valve prolapse   . Osteoporosis   . Status post dilation of esophageal narrowing     reports that she has never smoked. She has never used smokeless tobacco. She reports that she does not drink alcohol and does not use drugs.   Current Outpatient Medications:  .  albuterol (VENTOLIN HFA) 108 (90 Base) MCG/ACT inhaler, Inhale 1 puff into the lungs every 6 (six) hours as needed for wheezing or shortness of breath., Disp: 18 g, Rfl: 1 .  amLODipine (NORVASC) 5 MG tablet, Take 1 tablet (5 mg total) by mouth daily., Disp: 90 tablet, Rfl: 3 .  aspirin EC 81 MG  tablet, Take 81 mg by mouth every morning. , Disp: , Rfl:  .  atorvastatin (LIPITOR) 80 MG tablet, TAKE 1 TABLET BY MOUTH ONCE A DAY AT [place]., Disp: 90 tablet, Rfl: 3 .  Calcium-Magnesium-Vitamin D (CALCIUM 1200+D3 PO), Take 1,200 mg by mouth., Disp: , Rfl:  .  Cholecalciferol (VITAMIN D3) 1000 UNITS CAPS, Take 1 capsule by mouth every morning. , Disp: , Rfl:  .  diclofenac sodium (VOLTAREN) 1 % GEL, Apply 4 g topically 4 (four) times daily. To  affected joint., Disp: 100 g, Rfl: 2 .  fluticasone furoate-vilanterol (BREO ELLIPTA) 100-25 MCG/INH AEPB, Inhale 1 puff into the lungs daily., Disp: 60 each, Rfl: 5 .  gabapentin (NEURONTIN) 300 MG capsule, Take 1 capsule (300 mg total) by mouth 3 (three) times daily., Disp: 270 capsule, Rfl: 1 .  hydrochlorothiazide (HYDRODIURIL) 25 MG tablet, Take 1 tablet (25 mg total) by mouth daily., Disp: 90 tablet, Rfl: 3 .  isosorbide mononitrate (IMDUR) 30 MG 24 hr tablet, Take 1 tablet (30 mg total) by mouth daily., Disp: 90 tablet, Rfl: 3 .  loratadine (CLARITIN) 10 MG tablet, Take 1 tablet (10 mg total) by mouth every morning., Disp: 30 tablet, Rfl: 2 .  losartan (COZAAR) 100 MG tablet, TAKE 1 TABLET BY MOUTH ONCE A DAY, Disp: 90 tablet, Rfl: 3 .  metoprolol succinate (TOPROL-XL) 25 MG 24 hr tablet, Take 1 tablet (25 mg total) by mouth daily., Disp: 90 tablet, Rfl: 3 .  Multiple Vitamin (MULTIVITAMIN) tablet, Take 1 tablet by mouth every morning. , Disp: , Rfl:  .  nitroGLYCERIN (NITROSTAT) 0.4 MG SL tablet, Place 1 tablet (0.4 mg total) under the tongue every 5 (five) minutes as needed for chest pain., Disp: 25 tablet, Rfl: 3 .  Omega-3 Fatty Acids (FISH OIL) 1200 MG CAPS, Take 1 capsule by mouth every morning. , Disp: , Rfl:  .  omeprazole (PRILOSEC) 40 MG capsule, Take 1 capsule (40 mg total) by mouth 2 (two) times daily., Disp: 60 capsule, Rfl: 3 .  potassium chloride SA (KLOR-CON) 20 MEQ tablet, TAKE 2 TABLETS BY MOUTH TWICE DAILY, Disp: 360 tablet, Rfl: 3 .  traMADol (ULTRAM) 50 MG tablet, Take 1 tablet (50 mg total) by mouth every 6 (six) hours as needed for severe pain., Disp: 90 tablet, Rfl: 3 .  vitamin B-12 (CYANOCOBALAMIN) 100 MCG tablet, Take 1 tablet (100 mcg total) by mouth daily., Disp: , Rfl:  .  fluticasone (FLONASE) 50 MCG/ACT nasal spray, Place 1 spray into both nostrils as needed.  (Patient not taking: Reported on 07/29/2020), Disp: , Rfl:  .  furosemide (LASIX) 20 MG tablet, Take 1  tablet (20 mg total) by mouth daily as needed. (Patient not taking: Reported on 07/29/2020), Disp: 90 tablet, Rfl: 3   Observations/Objective: Height 5' 2"  (1.575 m), weight 156 lb (70.8 kg). BP at home this AM 148/105 but battery low HR 109 Oxygen sat 99% BP Readings from Last 3 Encounters:  06/25/20 120/70  04/29/20 122/62  03/25/20 118/60     Physical Exam  Physical Exam Constitutional:      General: The patient is not in acute distress. Pulmonary:     Effort: Pulmonary effort is normal. No respiratory distress.  Neurological:     Mental Status: The patient is alert and oriented to person, place, and time.  Psychiatric:        Mood and Affect: Mood normal.        Behavior: Behavior normal.  Assessment and Plan Problem List Items Addressed This Visit    Cough - Primary     Likely COVID19  infection vs other viral infection. No clear sign of bacterial infection at this time.  Recommend testing .. I She will be set up with drive through testing at our office for COVID and flu. No SOB.  No red flags/need for ER visit or in-person exam at respiratory clinic at this time..    Pt moderate risk for COVID complications given CAD,reactive airway, HTN, BMI 28  and age. If SOB begins symptoms worsening.. have low threshold for in-person exam, if severe shortness of breath ER visit recommended.  Can monitor Oxygen saturation at home with home monitor if able to obtain.  Go to ER if O2 sat < 90% on room air.  Reviewed home care and provided information through [place].  Recommended isolation until test returns. If returns positive 10 days quarantine recommended.  Provided info about prevention of spread of COVID 19.         Meds ordered this encounter  Medications  . promethazine-dextromethorphan (PROMETHAZINE-DM) 6.25-15 MG/5ML syrup    Sig: Take 5 mLs by mouth at bedtime as needed for cough.    Dispense:  118 mL    Refill:  0      I discussed the assessment and treatment  plan with the patient. The patient was provided an opportunity to ask questions and all were answered. The patient agreed with the plan and demonstrated an understanding of the instructions.   The patient was advised to call back or seek an in-person evaluation if the symptoms worsen or if the condition fails to improve as anticipated.     Eliezer Lofts, MD

## 2020-07-29 NOTE — Assessment & Plan Note (Addendum)
Likely COVID19  infection vs other viral infection. No clear sign of bacterial infection at this time.  Recommend testing .. I She will be set up with drive through testing at our office for COVID and flu. No SOB.  No red flags/need for ER visit or in-person exam at respiratory clinic at this time..    Pt moderate risk for COVID complications given CAD,reactive airway, HTN, BMI 28  and age. If SOB begins symptoms worsening.. have low threshold for in-person exam, if severe shortness of breath ER visit recommended.  Can monitor Oxygen saturation at home with home monitor if able to obtain.  Go to ER if O2 sat < 90% on room air.  Reviewed home care and provided information through [place].  Recommended isolation until test returns. If returns positive 10 days quarantine recommended.  Provided info about prevention of spread of COVID 19.

## 2020-07-29 NOTE — Progress Notes (Signed)
COVID and rapid flu ordered.

## 2020-07-30 LAB — SPECIMEN STATUS REPORT

## 2020-07-30 LAB — SARS-COV-2, NAA 2 DAY TAT

## 2020-07-30 LAB — NOVEL CORONAVIRUS, NAA: SARS-CoV-2, NAA: NOT DETECTED

## 2020-08-07 ENCOUNTER — Telehealth (HOSPITAL_COMMUNITY): Payer: Self-pay | Admitting: Family Medicine

## 2020-08-11 ENCOUNTER — Other Ambulatory Visit: Payer: Self-pay | Admitting: Family Medicine

## 2020-08-11 ENCOUNTER — Telehealth: Payer: Self-pay | Admitting: Family Medicine

## 2020-08-11 DIAGNOSIS — M5416 Radiculopathy, lumbar region: Secondary | ICD-10-CM

## 2020-08-11 NOTE — Telephone Encounter (Signed)
Patient states that [facility] denied her prescription for Tramadol. Please advise. EM

## 2020-08-11 NOTE — Telephone Encounter (Signed)
Patient called in stating that her medication refill request was denied by the pharmacy. Patient is asking that we resend the request again. Please advise. EM  Tramadol ([place])

## 2020-08-11 NOTE — Telephone Encounter (Signed)
Spoke to pt and told her that 41 with 3 refills was sent in in November. Pt stated that her bottle says no refills. I spoke to pharmacy and they said there was a mistake on their part and they would get the RX filled for pt.

## 2020-08-12 ENCOUNTER — Telehealth: Payer: Self-pay | Admitting: Cardiology

## 2020-08-12 ENCOUNTER — Other Ambulatory Visit (INDEPENDENT_AMBULATORY_CARE_PROVIDER_SITE_OTHER): Payer: PPO

## 2020-08-12 DIAGNOSIS — R748 Abnormal levels of other serum enzymes: Secondary | ICD-10-CM

## 2020-08-12 DIAGNOSIS — I208 Other forms of angina pectoris: Secondary | ICD-10-CM

## 2020-08-12 LAB — HEPATIC FUNCTION PANEL
ALT: 25 U/L (ref 0–35)
AST: 37 U/L (ref 0–37)
Albumin: 4.1 g/dL (ref 3.5–5.2)
Alkaline Phosphatase: 128 U/L — ABNORMAL HIGH (ref 39–117)
Bilirubin, Direct: 0.2 mg/dL (ref 0.0–0.3)
Total Bilirubin: 0.6 mg/dL (ref 0.2–1.2)
Total Protein: 7.5 g/dL (ref 6.0–8.3)

## 2020-08-12 LAB — ALKALINE PHOSPHATASE: Alkaline Phosphatase: 128 U/L — ABNORMAL HIGH (ref 39–117)

## 2020-08-12 NOTE — Telephone Encounter (Signed)
Per Dr Marlou Porch last OV note in August 2021 - -She misses cardiac rehab.  I encouraged her to continue with exercise efforts slowly at first then increasing.  Pt has a HX of prior STEMI 12/2016 with PCI X 2.  Since this history is so remote - not sure if Cardiac Rehab would be covered by her insurance.  Last cardiac rehab visit was 07/2019.  Will have Dr Marlou Porch to review for appropriateness and any new order.

## 2020-08-12 NOTE — Telephone Encounter (Signed)
Patient would like a referral from Dr. Marlou Porch to do Cardiac Rehab. The patient had one but it has run out.

## 2020-08-13 NOTE — Telephone Encounter (Signed)
Perhaps coronary artery disease/prior myocardial infarction/chest pain may cover cardiac rehab referral if she so desires. Candee Furbish, MD

## 2020-08-14 ENCOUNTER — Telehealth (HOSPITAL_COMMUNITY): Payer: Self-pay | Admitting: Family Medicine

## 2020-08-18 ENCOUNTER — Telehealth: Payer: Self-pay | Admitting: *Deleted

## 2020-08-18 DIAGNOSIS — I208 Other forms of angina pectoris: Secondary | ICD-10-CM

## 2020-08-18 NOTE — Telephone Encounter (Signed)
Would you place another referral for Cardiac rehab maintenance and have [place] sign? Unable to use this referral that is for phase II.   Thanks so much  Psychologist, clinical, BSN  Cardiac and Pulmonary Rehab Nurse Navigator   Another order placed as requested for maintenance

## 2020-08-18 NOTE — Telephone Encounter (Signed)
Order placed for cardiac rehab per Dr Marlou Porch.

## 2020-08-19 ENCOUNTER — Telehealth (HOSPITAL_COMMUNITY): Payer: Self-pay

## 2020-08-19 DIAGNOSIS — M545 Low back pain, unspecified: Secondary | ICD-10-CM | POA: Diagnosis not present

## 2020-08-19 DIAGNOSIS — M5416 Radiculopathy, lumbar region: Secondary | ICD-10-CM | POA: Diagnosis not present

## 2020-08-19 NOTE — Telephone Encounter (Signed)
Pt called and wanting to know have we received her cardiac rehab maintenance referral from Dr. Marlou Porch, advised pt that they placed the incorrect referral and needed to place the maintenance cardiac rehab referral advised pt that our nurse navigator send Dr. Marlou Porch office a message to place correct referral and that once they do someone from our office will contact her.

## 2020-08-26 ENCOUNTER — Encounter (HOSPITAL_COMMUNITY)
Admission: RE | Admit: 2020-08-26 | Discharge: 2020-08-26 | Disposition: A | Discharge status: Home / Self Care | Payer: Self-pay | Source: Ambulatory Visit | Attending: Cardiology | Admitting: Cardiology

## 2020-08-26 ENCOUNTER — Other Ambulatory Visit: Payer: Self-pay

## 2020-08-26 DIAGNOSIS — I208 Other forms of angina pectoris: Secondary | ICD-10-CM | POA: Insufficient documentation

## 2020-08-28 ENCOUNTER — Other Ambulatory Visit: Payer: Self-pay

## 2020-08-28 ENCOUNTER — Encounter (HOSPITAL_COMMUNITY)
Admission: RE | Admit: 2020-08-28 | Discharge: 2020-08-28 | Disposition: A | Discharge status: Home / Self Care | Payer: Self-pay | Source: Ambulatory Visit | Attending: Cardiology | Admitting: Cardiology

## 2020-09-02 ENCOUNTER — Encounter (HOSPITAL_COMMUNITY): Payer: Self-pay

## 2020-09-04 ENCOUNTER — Other Ambulatory Visit: Payer: Self-pay

## 2020-09-04 ENCOUNTER — Encounter (HOSPITAL_COMMUNITY)
Admission: RE | Admit: 2020-09-04 | Discharge: 2020-09-04 | Disposition: A | Discharge status: Home / Self Care | Payer: Self-pay | Source: Ambulatory Visit | Attending: Cardiology | Admitting: Cardiology

## 2020-09-09 ENCOUNTER — Encounter (HOSPITAL_COMMUNITY)
Admission: RE | Admit: 2020-09-09 | Discharge: 2020-09-09 | Disposition: A | Discharge status: Home / Self Care | Payer: Self-pay | Source: Ambulatory Visit | Attending: Cardiology | Admitting: Cardiology

## 2020-09-09 ENCOUNTER — Other Ambulatory Visit: Payer: Self-pay

## 2020-09-11 ENCOUNTER — Encounter (HOSPITAL_COMMUNITY)
Admission: RE | Admit: 2020-09-11 | Discharge: 2020-09-11 | Disposition: A | Discharge status: Home / Self Care | Payer: Self-pay | Source: Ambulatory Visit | Attending: Cardiology | Admitting: Cardiology

## 2020-09-11 ENCOUNTER — Other Ambulatory Visit: Payer: Self-pay

## 2020-09-16 ENCOUNTER — Encounter (HOSPITAL_COMMUNITY)
Admission: RE | Admit: 2020-09-16 | Discharge: 2020-09-16 | Disposition: A | Discharge status: Home / Self Care | Payer: Self-pay | Source: Ambulatory Visit | Attending: Cardiology | Admitting: Cardiology

## 2020-09-16 ENCOUNTER — Other Ambulatory Visit: Payer: Self-pay

## 2020-09-16 DIAGNOSIS — I208 Other forms of angina pectoris: Secondary | ICD-10-CM | POA: Insufficient documentation

## 2020-09-17 NOTE — Telephone Encounter (Signed)
Open in error

## 2020-09-18 ENCOUNTER — Encounter (HOSPITAL_COMMUNITY)
Admission: RE | Admit: 2020-09-18 | Discharge: 2020-09-18 | Disposition: A | Discharge status: Home / Self Care | Payer: Self-pay | Source: Ambulatory Visit | Attending: Cardiology | Admitting: Cardiology

## 2020-09-18 ENCOUNTER — Other Ambulatory Visit: Payer: Self-pay

## 2020-09-23 ENCOUNTER — Encounter (HOSPITAL_COMMUNITY)
Admission: RE | Admit: 2020-09-23 | Discharge: 2020-09-23 | Disposition: A | Discharge status: Home / Self Care | Payer: Self-pay | Source: Ambulatory Visit | Attending: Cardiology | Admitting: Cardiology

## 2020-09-23 ENCOUNTER — Other Ambulatory Visit: Payer: Self-pay

## 2020-09-24 ENCOUNTER — Other Ambulatory Visit: Payer: Self-pay | Admitting: Family Medicine

## 2020-09-24 ENCOUNTER — Other Ambulatory Visit: Payer: Self-pay | Admitting: Nurse Practitioner

## 2020-09-24 DIAGNOSIS — M5416 Radiculopathy, lumbar region: Secondary | ICD-10-CM

## 2020-09-24 MED ORDER — AMLODIPINE BESYLATE 5 MG PO TABS: 5.0000 mg | ORAL_TABLET | Freq: Every day | ORAL | 1 refills | Status: DC

## 2020-09-24 NOTE — Telephone Encounter (Signed)
Last office visit 07/29/2020 with Dr. Diona Browner for cough.  Last refilled 06/25/2020 for #90 with 3 refills.  CPE scheduled for 11/13/2020.

## 2020-09-25 ENCOUNTER — Other Ambulatory Visit: Payer: Self-pay

## 2020-09-25 ENCOUNTER — Encounter (HOSPITAL_COMMUNITY)
Admission: RE | Admit: 2020-09-25 | Discharge: 2020-09-25 | Disposition: A | Discharge status: Home / Self Care | Payer: Self-pay | Source: Ambulatory Visit | Attending: Cardiology | Admitting: Cardiology

## 2020-09-30 ENCOUNTER — Other Ambulatory Visit: Payer: Self-pay

## 2020-09-30 ENCOUNTER — Encounter (HOSPITAL_COMMUNITY)
Admission: RE | Admit: 2020-09-30 | Discharge: 2020-09-30 | Disposition: A | Discharge status: Home / Self Care | Payer: Self-pay | Source: Ambulatory Visit | Attending: Cardiology | Admitting: Cardiology

## 2020-10-02 ENCOUNTER — Encounter (HOSPITAL_COMMUNITY): Payer: Self-pay

## 2020-10-07 ENCOUNTER — Encounter (HOSPITAL_COMMUNITY)
Admission: RE | Admit: 2020-10-07 | Discharge: 2020-10-07 | Disposition: A | Discharge status: Home / Self Care | Payer: PPO | Source: Ambulatory Visit | Attending: Cardiology | Admitting: Cardiology

## 2020-10-07 ENCOUNTER — Other Ambulatory Visit: Payer: Self-pay

## 2020-10-09 ENCOUNTER — Encounter (HOSPITAL_COMMUNITY)
Admission: RE | Admit: 2020-10-09 | Discharge: 2020-10-09 | Disposition: A | Discharge status: Home / Self Care | Payer: PPO | Source: Ambulatory Visit | Attending: Cardiology | Admitting: Cardiology

## 2020-10-09 ENCOUNTER — Other Ambulatory Visit: Payer: Self-pay

## 2020-10-14 ENCOUNTER — Encounter (HOSPITAL_COMMUNITY)
Admission: RE | Admit: 2020-10-14 | Discharge: 2020-10-14 | Disposition: A | Discharge status: Home / Self Care | Payer: Self-pay | Source: Ambulatory Visit | Attending: Cardiology | Admitting: Cardiology

## 2020-10-14 ENCOUNTER — Ambulatory Visit: Payer: PPO

## 2020-10-14 ENCOUNTER — Encounter (HOSPITAL_COMMUNITY): Payer: Self-pay

## 2020-10-14 ENCOUNTER — Other Ambulatory Visit: Payer: Self-pay

## 2020-10-14 DIAGNOSIS — I208 Other forms of angina pectoris: Secondary | ICD-10-CM | POA: Insufficient documentation

## 2020-10-16 ENCOUNTER — Other Ambulatory Visit: Payer: Self-pay

## 2020-10-16 ENCOUNTER — Encounter (HOSPITAL_COMMUNITY)
Admission: RE | Admit: 2020-10-16 | Discharge: 2020-10-16 | Disposition: A | Discharge status: Home / Self Care | Payer: Self-pay | Source: Ambulatory Visit | Attending: Cardiology | Admitting: Cardiology

## 2020-10-21 ENCOUNTER — Encounter (HOSPITAL_COMMUNITY)
Admission: RE | Admit: 2020-10-21 | Discharge: 2020-10-21 | Disposition: A | Discharge status: Home / Self Care | Payer: Self-pay | Source: Ambulatory Visit | Attending: Cardiology | Admitting: Cardiology

## 2020-10-21 ENCOUNTER — Other Ambulatory Visit: Payer: Self-pay

## 2020-10-23 ENCOUNTER — Encounter: Payer: PPO | Admitting: Family Medicine

## 2020-10-23 ENCOUNTER — Other Ambulatory Visit: Payer: Self-pay

## 2020-10-23 ENCOUNTER — Encounter (HOSPITAL_COMMUNITY)
Admission: RE | Admit: 2020-10-23 | Discharge: 2020-10-23 | Disposition: A | Discharge status: Home / Self Care | Payer: Self-pay | Source: Ambulatory Visit | Attending: Cardiology | Admitting: Cardiology

## 2020-10-28 ENCOUNTER — Other Ambulatory Visit: Payer: Self-pay

## 2020-10-28 ENCOUNTER — Encounter (HOSPITAL_COMMUNITY)
Admission: RE | Admit: 2020-10-28 | Discharge: 2020-10-28 | Disposition: A | Discharge status: Home / Self Care | Payer: Self-pay | Source: Ambulatory Visit | Attending: Cardiology | Admitting: Cardiology

## 2020-10-30 ENCOUNTER — Other Ambulatory Visit: Payer: Self-pay

## 2020-10-30 ENCOUNTER — Encounter (HOSPITAL_COMMUNITY)
Admission: RE | Admit: 2020-10-30 | Discharge: 2020-10-30 | Disposition: A | Discharge status: Home / Self Care | Payer: Self-pay | Source: Ambulatory Visit | Attending: Cardiology | Admitting: Cardiology

## 2020-11-04 ENCOUNTER — Other Ambulatory Visit: Payer: Self-pay

## 2020-11-04 ENCOUNTER — Encounter (HOSPITAL_COMMUNITY)
Admission: RE | Admit: 2020-11-04 | Discharge: 2020-11-04 | Disposition: A | Discharge status: Home / Self Care | Payer: Self-pay | Source: Ambulatory Visit | Attending: Cardiology | Admitting: Cardiology

## 2020-11-06 ENCOUNTER — Other Ambulatory Visit: Payer: Self-pay

## 2020-11-06 ENCOUNTER — Other Ambulatory Visit: Payer: PPO

## 2020-11-06 ENCOUNTER — Encounter (HOSPITAL_COMMUNITY)
Admission: RE | Admit: 2020-11-06 | Discharge: 2020-11-06 | Disposition: A | Discharge status: Home / Self Care | Payer: Self-pay | Source: Ambulatory Visit | Attending: Cardiology | Admitting: Cardiology

## 2020-11-11 ENCOUNTER — Ambulatory Visit (INDEPENDENT_AMBULATORY_CARE_PROVIDER_SITE_OTHER): Payer: PPO

## 2020-11-11 ENCOUNTER — Other Ambulatory Visit: Payer: Self-pay

## 2020-11-11 ENCOUNTER — Encounter (HOSPITAL_COMMUNITY)
Admission: RE | Admit: 2020-11-11 | Discharge: 2020-11-11 | Disposition: A | Discharge status: Home / Self Care | Payer: Self-pay | Source: Ambulatory Visit | Attending: Cardiology | Admitting: Cardiology

## 2020-11-11 DIAGNOSIS — Z0000 Encounter for general adult medical examination without abnormal findings: Secondary | ICD-10-CM

## 2020-11-11 NOTE — Progress Notes (Signed)
PCP notes:  Health Maintenance: Covid- declined Dexa- due    Abnormal Screenings: none   Patient concerns: none   Nurse concerns: none   Next PCP appt.: 11/18/2020 @ 11:20 am

## 2020-11-11 NOTE — Patient Instructions (Signed)
Hannah Vasquez , Thank you for taking time to come for your Medicare Wellness Visit. I appreciate your ongoing commitment to your health goals. Please review the following plan we discussed and let me know if I can assist you in the future.   Screening recommendations/referrals: Colonoscopy: Up to date, completed 12/28/2016, due 12/2026 Mammogram: Up to date, completed 11/20/2019, due 11/2020 Bone Density: due, will discuss with provider at physical  Recommended yearly ophthalmology/optometry visit for glaucoma screening and checkup Recommended yearly dental visit for hygiene and checkup  Vaccinations: Influenza vaccine: Up to date, completed 04/26/2020, due 03/2021 Pneumococcal vaccine: Completed series Tdap vaccine: Up to date, completed 09/09/2011, due 08/2021 Shingles vaccine: due, check with your insurance regarding coverage if interested    Covid-19:declined   Advanced directives: Please bring a copy of your POA (Power of [place]) and/or Living Will to your next appointment.   Conditions/risks identified: hypertension  Next appointment: Follow up in one year for your annual wellness visit    Preventive Care 72 Years and Older, Female Preventive care refers to lifestyle choices and visits with your health care provider that can promote health and wellness. What does preventive care include?  A yearly physical exam. This is also called an annual well check.  Dental exams once or twice a year.  Routine eye exams. Ask your health care provider how often you should have your eyes checked.  Personal lifestyle choices, including:  Daily care of your teeth and gums.  Regular physical activity.  Eating a healthy diet.  Avoiding tobacco and drug use.  Limiting alcohol use.  Practicing safe sex.  Taking low-dose aspirin every day.  Taking vitamin and mineral supplements as recommended by your health care provider. What happens during an annual well check? The services and screenings  done by your health care provider during your annual well check will depend on your age, overall health, lifestyle risk factors, and family history of disease. Counseling  Your health care provider may ask you questions about your:  Alcohol use.  Tobacco use.  Drug use.  Emotional well-being.  Home and relationship well-being.  Sexual activity.  Eating habits.  History of falls.  Memory and ability to understand (cognition).  Work and work Statistician.  Reproductive health. Screening  You may have the following tests or measurements:  Height, weight, and BMI.  Blood pressure.  Lipid and cholesterol levels. These may be checked every 5 years, or more frequently if you are over 53 years old.  Skin check.  Lung cancer screening. You may have this screening every year starting at age 94 if you have a 30-pack-year history of smoking and currently smoke or have quit within the past 15 years.  Fecal occult blood test (FOBT) of the stool. You may have this test every year starting at age 37.  Flexible sigmoidoscopy or colonoscopy. You may have a sigmoidoscopy every 5 years or a colonoscopy every 10 years starting at age 4.  Hepatitis C blood test.  Hepatitis B blood test.  Sexually transmitted disease (STD) testing.  Diabetes screening. This is done by checking your blood sugar (glucose) after you have not eaten for a while (fasting). You may have this done every 1-3 years.  Bone density scan. This is done to screen for osteoporosis. You may have this done starting at age 39.  Mammogram. This may be done every 1-2 years. Talk to your health care provider about how often you should have regular mammograms. Talk with your health care  provider about your test results, treatment options, and if necessary, the need for more tests. Vaccines  Your health care provider may recommend certain vaccines, such as:  Influenza vaccine. This is recommended every year.  Tetanus,  diphtheria, and acellular pertussis (Tdap, Td) vaccine. You may need a Td booster every 10 years.  Zoster vaccine. You may need this after age 26.  Pneumococcal 13-valent conjugate (PCV13) vaccine. One dose is recommended after age 78.  Pneumococcal polysaccharide (PPSV23) vaccine. One dose is recommended after age 57. Talk to your health care provider about which screenings and vaccines you need and how often you need them. This information is not intended to replace advice given to you by your health care provider. Make sure you discuss any questions you have with your health care provider. Document Released: 08/29/2015 Document Revised: 04/21/2016 Document Reviewed: 06/03/2015 Elsevier Interactive Patient Education  2017 [employer] Prevention in the Home Falls can cause injuries. They can happen to people of all ages. There are many things you can do to make your home safe and to help prevent falls. What can I do on the outside of my home?  Regularly fix the edges of walkways and driveways and fix any cracks.  Remove anything that might make you trip as you walk through a door, such as a raised step or threshold.  Trim any bushes or trees on the path to your home.  Use bright outdoor lighting.  Clear any walking paths of anything that might make someone trip, such as rocks or tools.  Regularly check to see if handrails are loose or broken. Make sure that both sides of any steps have handrails.  Any raised decks and porches should have guardrails on the edges.  Have any leaves, snow, or ice cleared regularly.  Use sand or salt on walking paths during winter.  Clean up any spills in your garage right away. This includes oil or grease spills. What can I do in the bathroom?  Use night lights.  Install grab bars by the toilet and in the tub and shower. Do not use towel bars as grab bars.  Use non-skid mats or decals in the tub or shower.  If you need to sit down in  the shower, use a plastic, non-slip stool.  Keep the floor dry. Clean up any water that spills on the floor as soon as it happens.  Remove soap buildup in the tub or shower regularly.  Attach bath mats securely with double-sided non-slip rug tape.  Do not have throw rugs and other things on the floor that can make you trip. What can I do in the bedroom?  Use night lights.  Make sure that you have a light by your bed that is easy to reach.  Do not use any sheets or blankets that are too big for your bed. They should not hang down onto the floor.  Have a firm chair that has side arms. You can use this for support while you get dressed.  Do not have throw rugs and other things on the floor that can make you trip. What can I do in the kitchen?  Clean up any spills right away.  Avoid walking on wet floors.  Keep items that you use a lot in easy-to-reach places.  If you need to reach something above you, use a strong step stool that has a grab bar.  Keep electrical cords out of the way.  Do not use floor polish  or wax that makes floors slippery. If you must use wax, use non-skid floor wax.  Do not have throw rugs and other things on the floor that can make you trip. What can I do with my stairs?  Do not leave any items on the stairs.  Make sure that there are handrails on both sides of the stairs and use them. Fix handrails that are broken or loose. Make sure that handrails are as long as the stairways.  Check any carpeting to make sure that it is firmly attached to the stairs. Fix any carpet that is loose or worn.  Avoid having throw rugs at the top or bottom of the stairs. If you do have throw rugs, attach them to the floor with carpet tape.  Make sure that you have a light switch at the top of the stairs and the bottom of the stairs. If you do not have them, ask someone to add them for you. What else can I do to help prevent falls?  Wear shoes that:  Do not have high  heels.  Have rubber bottoms.  Are comfortable and fit you well.  Are closed at the toe. Do not wear sandals.  If you use a stepladder:  Make sure that it is fully opened. Do not climb a closed stepladder.  Make sure that both sides of the stepladder are locked into place.  Ask someone to hold it for you, if possible.  Clearly mark and make sure that you can see:  Any grab bars or handrails.  First and last steps.  Where the edge of each step is.  Use tools that help you move around (mobility aids) if they are needed. These include:  Canes.  Walkers.  Scooters.  Crutches.  Turn on the lights when you go into a dark area. Replace any light bulbs as soon as they burn out.  Set up your furniture so you have a clear path. Avoid moving your furniture around.  If any of your floors are uneven, fix them.  If there are any pets around you, be aware of where they are.  Review your medicines with your doctor. Some medicines can make you feel dizzy. This can increase your chance of falling. Ask your doctor what other things that you can do to help prevent falls. This information is not intended to replace advice given to you by your health care provider. Make sure you discuss any questions you have with your health care provider. Document Released: 05/29/2009 Document Revised: 01/08/2016 Document Reviewed: 09/06/2014 Elsevier Interactive Patient Education  2017 [employer].

## 2020-11-11 NOTE — Progress Notes (Signed)
Subjective:   Hannah Vasquez is a 70 y.o. female who presents for Medicare Annual (Subsequent) preventive examination.  Review of Systems: N/A      I connected with the patient today by telephone and verified that I am speaking with the correct person using two identifiers. Location patient: home Location nurse: work Persons participating in the telephone visit: patient, nurse.   I discussed the limitations, risks, security and privacy concerns of performing an evaluation and management service by telephone and the availability of in person appointments. I also discussed with the patient that there may be a patient responsible charge related to this service. The patient expressed understanding and verbally consented to this telephonic visit.        Cardiac Risk Factors include: advanced age (>86mn, >>72women);hypertension     Objective:    Today's Vitals   There is no height or weight on file to calculate BMI.  Advanced Directives 11/11/2020 04/25/2019 04/19/2018 02/22/2017 01/02/2017 01/01/2017 09/20/2016  Does Patient Have a Medical Advance Directive? Yes Yes Yes Yes Yes No Yes  Type of AParamedicof ABear DanceLiving will HAlexand[place]Living will [place]Living will - HC[place]raLiving will - [place] Mar[place]Living will  Does patient want to make changes to medical advance directive? - No - Patient declined - No - Patient declined No - Patient declined - -  Copy of HYabuc[place]ain Chart? No - copy requested No - copy requested No - copy requested - No - copy requested - No - copy requested    Current Medications (verified) Outpatient Encounter Medications as of 11/11/2020  Medication Sig  . albuterol (VENTOLIN HFA) 108 (90 Base) MCG/ACT inhaler Inhale 1 puff into the lungs every 6 (six) hours as needed for wheezing or shortness of breath.  .Marland KitchenamLODipine (NORVASC) 5 MG tablet Take 1  tablet (5 mg total) by mouth daily.  .Marland Kitchenaspirin EC 81 MG tablet Take 81 mg by mouth every morning.   .Marland Kitchenatorvastatin (LIPITOR) 80 MG tablet TAKE 1 TABLET BY [facility]TH [facility] A [facility].  . Calcium-Magnesium-Vitamin D (CALCIUM 1200+D3 PO) Take 1,200 mg by mouth.  . Cholecalciferol (VITAMIN D3) 1000 UNITS CAPS Take 1 capsule by mouth every morning.   . diclofenac sodium (VOLTAREN) 1 % GEL Apply 4 g topically 4 (four) times daily. To affected joint.  . fluticasone (FLONASE) 50 MCG/ACT nasal spray Place 1 spray into both nostrils as needed.  . fluticasone furoate-vilanterol (BREO ELLIPTA) 100-25 MCG/INH AEPB Inhale 1 puff into the lungs daily.  . furosemide (LASIX) 20 MG tablet Take 1 tablet (20 mg total) by mouth daily as needed.  . gabapentin (NEURONTIN) 300 MG capsule Take 1 capsule (300 mg total) by mouth 3 (three) times daily.  . isosorbide mononitrate (IMDUR) 30 MG 24 hr tablet Take 1 tablet (30 mg total) by mouth daily.  .Marland Kitchenloratadine (CLARITIN) 10 MG tablet Take 1 tablet (10 mg total) by mouth every morning.  .Marland Kitchenlosartan (COZAAR) 100 MG tablet TAKE 1 TABLET BY MOUTH ONCE A DAY  . metoprolol succinate (TOPROL-XL) 25 MG 24 hr tablet Take 1 tablet (25 mg total) by mouth daily.  . Multiple Vitamin (MULTIVITAMIN) tablet Take 1 tablet by mouth every morning.   . nitroGLYCERIN (NITROSTAT) 0.4 MG SL tablet Place 1 tablet (0.4 mg total) under the tongue every 5 (five) minutes as needed for chest pain.  . Omega-3 Fatty Acids (  FISH OIL) 1200 MG CAPS Take 1 capsule by mouth every morning.   Marland Kitchen omeprazole (PRILOSEC) 40 MG capsule Take 1 capsule (40 mg total) by mouth 2 (two) times daily.  . potassium chloride SA (KLOR-CON) 20 MEQ tablet TAKE 2 TABLETS BY MOUTH TWICE DAILY  . promethazine-dextromethorphan (PROMETHAZINE-DM) 6.25-15 MG/5ML syrup Take 5 mLs by mouth at bedtime as needed for cough.  . traMADol (ULTRAM) 50 MG tablet TAKE 1 TABLET BY MOUTH EVERY 6 HOURS AS NEEDED FOR SEVERE PAIN.  Marland Kitchen  vitamin B-12 (CYANOCOBALAMIN) 100 MCG tablet Take 1 tablet (100 mcg total) by mouth daily.  . hydrochlorothiazide (HYDRODIURIL) 25 MG tablet Take 1 tablet (25 mg total) by mouth daily.   No facility-administered encounter medications on file as of 11/11/2020.    Allergies (verified) Bactroban [mupirocin calcium], Codeine, Penicillins, and Valtrex [valacyclovir hcl]   History: Past Medical History:  Diagnosis Date  . Allergy   . Anxiety   . CAD (coronary artery disease)   . Cataract   . Does use hearing aid    bilateral as of 10/05/2019  . Elevated alkaline phosphatase level   . Hemorrhoids   . Hepatic steatosis   . History of MI (myocardial infarction) 10/07/2017  . Hypertension   . Mitral valve prolapse   . Osteoporosis   . Status post dilation of esophageal narrowing    Past Surgical History:  Procedure Laterality Date  . BASAL CELL CARCINOMA EXCISION  02/05/2016   Dr. Sarajane Jews, [facility]  . BLADDER SURGERY  11/15/1995   bladder neck suspension, urinary incontinence  . CARDIAC CATHETERIZATION  01/03/2017 DES LAD  . CATARACT EXTRACTION Left 04/30/2016   Dr. Tommy Rainwater, [place] N/A 01/03/2017   Procedure: Coronary Stent Intervention;  Surgeon: Nelva Bush, MD;  Location: [place] [facility];  Service: Cardiovascular;  Laterality: N/A;  . DENTAL SURGERY  06/20/2016   Tooth Implant; Dr. Kalman Shan  . INTRAVASCULAR ULTRASOUND/IVUS N/A 01/03/2017   Procedure: Intravascular Ultrasound/IVUS;  Surgeon: Nelva Bush, MD;  Location: [place] [facility];  Service: Cardiovascular;  Laterality: N/A;  . KNEE SURGERY  2003   torn meniscus  . LEFT HEART CATH AND CORONARY ANGIOGRAPHY N/A 01/03/2017   Procedure: Left Heart Cath and Coronary Angiography;  Surgeon: Nelva Bush, MD;  Location: [place] [facility];  Service: Cardiovascular;  Laterality: N/A;  . MOHS SURGERY Right 2017   right side of face  . ROTATOR CUFF REPAIR    . SHOULDER  SURGERY Right 2005   Family History  Problem Relation Age of Onset  . Mitral valve prolapse Mother   . Hypertension Mother   . Hypertension Father   . Brain cancer Father   . Colon cancer Neg Hx   . Stomach cancer Neg Hx   . Esophageal cancer Neg Hx   . Pancreatic cancer Neg Hx    Social History   Socioeconomic History  . Marital status: Divorced    Spouse name: Not on file  . Number of children: 2  . Years of education: high school  . Highest education level: Not on file  Occupational History  . Occupation: Medical office---front desk    Comment: Retired  Tobacco Use  . Smoking status: Never Smoker  . Smokeless tobacco: Never Used  Vaping Use  . Vaping Use: Never used  Substance and Sexual Activity  . Alcohol use: No    Alcohol/week: 0.0 standard drinks  . Drug use: No  . Sexual activity: Not Currently  Birth control/protection: Post-menopausal  Other Topics Concern  . 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 Difficulty of Paying Living Expenses: Not hard at all  Food Insecurity: No Food Insecurity  . Worried About Charity fundraiser in the Last Year: Never true  . Ran Out of Food in the Last Year: Never true  Transportation Needs: No Transportation Needs  . Lack of Transportation (Medical): No  . Lack of Transportation (Non-Medical): No  Physical Activity: Insufficiently Active  . Days of Exercise per Week: 2 days  . Minutes of Exercise per Session:  50 min  Stress: No Stress Concern Present  . Feeling of Stress : Not at all  Social Connections: Not on file    Tobacco Counseling Counseling given: Not Answered   Clinical Intake:  Pre-visit preparation completed: Yes  Pain : 0-10 Pain Type: Chronic pain Pain Location: Foot Pain Orientation: Right Pain Descriptors / Indicators: Aching Pain Onset: More than a month ago Pain Frequency: Intermittent     Nutritional Risks: None Diabetes: No  How often do you need to have someone help you when you read instructions, pamphlets, or other written materials from your doctor or pharmacy?: 1 - Never What is the last grade level you completed in school?: 12th  Diabetic: No Nutrition Risk Assessment:  Has the patient had any N/V/D within the last 2 months?  No  Does the patient have any non-healing wounds?  No  Has the patient had any unintentional weight loss or weight gain?  No   Diabetes:  Is the patient diabetic?  No  If diabetic, was a CBG obtained today?  N/A  Did the patient bring in their glucometer from home?  N/A  How often do you monitor your CBG's? N/A.   Financial Strains and Diabetes Management:  Are you having any financial strains with the device, your supplies or your medication? N/A.  Does the patient want to be seen by Chronic Care Management for management of their diabetes?  N/A Would the patient like to be referred to a Nutritionist or for Diabetic Management?  N/A   Interpreter Needed?: No  Information entered by :: CJohnson, LPN   Activities of Daily Living In your present state of health, do you have any difficulty performing the following activities: 11/11/2020  Hearing? Y  Comment wears hearing aids  Vision? N  Difficulty concentrating or making decisions? N  Walking or climbing stairs? N  Dressing or bathing? N  Doing errands, shopping? N  Preparing Food and eating ? N  Using the Toilet? N  In the past six months, have you accidently  leaked urine? Y  Comment wears pads  Do you have problems with loss of bowel control? N  Managing your Medications? N  Managing your Finances? N  Housekeeping or managing your Housekeeping? N  Some recent data might be hidden    Patient Care Team: Lesleigh Noe, MD as PCP - General (Family Medicine) Jerline Pain, MD as PCP - Cardiology (Cardiology) Darleen Crocker, MD as Consulting Physician (Ophthalmology) Thelma Comp, [place] as Consulting Physician (Optometry) Griselda Miner, MD as Consulting Physician (Dermatology) Renee Pain, DDS as Consulting Physician (Dentistry)  Indicate any recent Medical Services you may have received from other than [facility] providers in the past year (date may be approximate).     Assessment:   This is a routine wellness examination for [place].  Hearing/Vision screen  Hearing Screening   125Hz  250Hz  500Hz  1000Hz  2000Hz  3000Hz  4000Hz  6000Hz  8000Hz   Right ear:           Left ear:           Vision Screening Comments: Patient gets annual eye exams  Dietary issues and exercise activities discussed: Current Exercise Habits: Structured exercise class, Type of exercise: Other - see comments (cardiac rehab), Time (Minutes): 45, Frequency (Times/Week): 2, Weekly Exercise (Minutes/Week): 90, Intensity: Moderate, Exercise limited by: None identified  Goals    . Increase physical activity     When weather permits, I will resume walking 30-60 min 2-3 days per week.     . Patient Stated     11/11/2020, I will continue cardio rehab 2 days a week for 45 minutes.       Depression Screen PHQ 2/9 Scores 11/11/2020 09/06/2019 07/11/2019 04/25/2019 04/19/2018 01/10/2018 06/01/2017  PHQ - 2 Score 0 0 2 0 0 0 0  PHQ- 9 Score 0 - 8 - - - -    Fall Risk Fall Risk  11/11/2020 07/29/2020 09/06/2019 04/25/2019 04/19/2018  Falls in the past year? 0 0 0 0 No  Number falls in past yr: 0 - - - -  Injury with Fall? 0 - - - -  Comment - - - - -  Risk for fall due to :  Medication side effect - - - -  Follow up Falls evaluation completed;Falls prevention discussed Falls evaluation completed - - -    FALL RISK PREVENTION PERTAINING TO THE HOME:  Any stairs in or around the home? Yes  If so, are there any without handrails? No  Home free of loose throw rugs in walkways, pet beds, electrical cords, etc? Yes  Adequate lighting in your home to reduce risk of falls? Yes   ASSISTIVE DEVICES UTILIZED TO PREVENT FALLS:  Life alert? No  Use of a cane, walker or w/c? No  Grab bars in the bathroom? No  Shower chair or bench in shower? No  Elevated toilet seat or a handicapped toilet? No   TIMED UP AND GO:  Was the test performed? N/A telephone visit .   Cognitive Function: MMSE - Mini Mental State Exam 11/11/2020 04/19/2018 09/20/2016  Orientation to time 5 5 5   Orientation to Place 5 5 5   Registration 3 3 3   Attention/ Calculation 5 5 0  Recall 3 3 3   Language- name 2 objects - 2 0  Language- repeat 1 1 1   Language- follow 3 step command - 3 3  Language- read & follow direction - 1 0  Write a sentence - 1 0  Copy design - 1 0  Total score - 30 20  Mini Cog  Mini-Cog screen was completed. Maximum score is 22. A value of 0 denotes this part of the MMSE was not completed or  the patient failed this part of the Mini-Cog screening.       Immunizations Immunization History  Administered Date(s) Administered  . Fluad Quad(high Dose 65+) 04/09/2019, 04/26/2020  . Influenza Split 05/27/2011  . Influenza Whole 06/04/2009  . Influenza,inj,Quad PF,6+ Mos 06/07/2013, 06/12/2014, 05/09/2015, 06/11/2016, 06/03/2017, 04/27/2018  . Pneumococcal Conjugate-13 11/27/2014  . Pneumococcal Polysaccharide-23 11/08/2013  . Td 08/16/1993  . Tdap 09/09/2011  . Zoster 11/19/2009    TDAP status: Up to date  Flu Vaccine status: Up to date  Pneumococcal vaccine status: Up to date  Covid-19 vaccine status: Declined, Education has been provided regarding the  importance of this vaccine but patient still declined. Advised may receive this vaccine at local pharmacy or Health Dept.or vaccine clinic. Aware to provide a copy of the vaccination record if obtained from local pharmacy or Health Dept. Verbalized acceptance and understanding.  Qualifies for Shingles Vaccine? Yes   Zostavax completed Yes   Shingrix Completed?: No.    Education has been provided regarding the importance of this vaccine. Patient has been advised to call insurance company to determine out of pocket expense if they have not yet received this vaccine. Advised may also receive vaccine at local pharmacy or Health Dept. Verbalized acceptance and understanding.  Screening Tests Health Maintenance  Topic Date Due  . DEXA SCAN  05/05/2020  . COVID-19 Vaccine (1) 11/27/2021 (Originally 10/02/1955)  . MAMMOGRAM  11/19/2020  . TETANUS/TDAP  09/08/2021  . COLONOSCOPY (Pts 45-23yr Insurance coverage will need to be confirmed)  12/29/2026  . INFLUENZA VACCINE  Completed  . Hepatitis C Screening  Completed  . PNA vac Low Risk Adult  Completed  . HPV VACCINES  Aged Out    Health Maintenance  Health Maintenance Due  Topic Date Due  . DEXA SCAN  05/05/2020    Colorectal cancer screening: Type of screening: Colonoscopy. Completed 12/28/2016. Repeat every 10 years  Mammogram status: Completed 11/20/2019. Repeat every year  Bone Density status: due, will discuss with provider  Lung Cancer Screening: (Low Dose CT Chest recommended if Age 72-80years, 30 pack-year currently smoking OR have quit w/in 15 years.) does not qualify.    Additional Screening:  Hepatitis C Screening: does qualify; Completed 07/23/2019   Vision Screening: Recommended annual ophthalmology exams for early detection of glaucoma and other disorders of the eye. Is the patient up to date with their annual eye exam?  Yes  Who is the provider or what is the name of the office in which the patient attends annual eye  exams? Dr. BRick DuffIf pt is not established with a provider, would they like to be referred to a provider to establish care? No .   Dental Screening: Recommended annual dental exams for proper oral hygiene  Community Resource Referral / Chronic Care Management: CRR required this visit?  No   CCM required this visit?  No      Plan:     I have personally reviewed and noted the following in the patient's chart:   . Medical and social history . Use of alcohol, tobacco or illicit drugs  . Current medications and supplements . Functional ability and status . Nutritional status . Physical activity . Advanced directives . List of other physicians . Hospitalizations, surgeries, and ER visits in previous 12 months . Vitals . Screenings to include cognitive, depression, and falls . Referrals and appointments  In addition, I have reviewed and discussed with patient certain preventive protocols, quality metrics, and best practice recommendations. A  written personalized care plan for preventive services as well as general preventive health recommendations were provided to patient.   Due to this being a telephonic visit, the after visit summary with patients personalized plan was offered to patient via office or my-chart. Patient preferred to pick up at office at next visit or via mychart.   Andrez Grime, LPN   [id number]

## 2020-11-12 DIAGNOSIS — M545 Low back pain, unspecified: Secondary | ICD-10-CM | POA: Diagnosis not present

## 2020-11-12 DIAGNOSIS — M5416 Radiculopathy, lumbar region: Secondary | ICD-10-CM | POA: Diagnosis not present

## 2020-11-13 ENCOUNTER — Encounter (HOSPITAL_COMMUNITY)
Admission: RE | Admit: 2020-11-13 | Discharge: 2020-11-13 | Disposition: A | Discharge status: Home / Self Care | Payer: Self-pay | Source: Ambulatory Visit | Attending: Cardiology | Admitting: Cardiology

## 2020-11-13 ENCOUNTER — Encounter: Payer: PPO | Admitting: Family Medicine

## 2020-11-13 ENCOUNTER — Other Ambulatory Visit: Payer: Self-pay

## 2020-11-17 DIAGNOSIS — M5416 Radiculopathy, lumbar region: Secondary | ICD-10-CM | POA: Diagnosis not present

## 2020-11-18 ENCOUNTER — Ambulatory Visit (INDEPENDENT_AMBULATORY_CARE_PROVIDER_SITE_OTHER): Payer: PPO | Admitting: Family Medicine

## 2020-11-18 ENCOUNTER — Encounter: Payer: Self-pay | Admitting: Family Medicine

## 2020-11-18 ENCOUNTER — Other Ambulatory Visit: Payer: Self-pay

## 2020-11-18 VITALS — BP 122/70 | HR 87 | Temp 98.5°F | Ht 61.5 in | Wt 152.5 lb

## 2020-11-18 DIAGNOSIS — Z0000 Encounter for general adult medical examination without abnormal findings: Secondary | ICD-10-CM

## 2020-11-18 DIAGNOSIS — R7303 Prediabetes: Secondary | ICD-10-CM | POA: Diagnosis not present

## 2020-11-18 DIAGNOSIS — M810 Age-related osteoporosis without current pathological fracture: Secondary | ICD-10-CM | POA: Diagnosis not present

## 2020-11-18 DIAGNOSIS — E782 Mixed hyperlipidemia: Secondary | ICD-10-CM

## 2020-11-18 DIAGNOSIS — M5416 Radiculopathy, lumbar region: Secondary | ICD-10-CM | POA: Diagnosis not present

## 2020-11-18 DIAGNOSIS — I10 Essential (primary) hypertension: Secondary | ICD-10-CM

## 2020-11-18 LAB — COMPREHENSIVE METABOLIC PANEL
ALT: 34 U/L (ref 0–35)
AST: 41 U/L — ABNORMAL HIGH (ref 0–37)
Albumin: 4.4 g/dL (ref 3.5–5.2)
Alkaline Phosphatase: 130 U/L — ABNORMAL HIGH (ref 39–117)
BUN: 16 mg/dL (ref 6–23)
CO2: 25 mEq/L (ref 19–32)
Calcium: 10 mg/dL (ref 8.4–10.5)
Chloride: 104 mEq/L (ref 96–112)
Creatinine, Ser: 1.03 mg/dL (ref 0.40–1.20)
GFR: 54.47 mL/min — ABNORMAL LOW (ref >60.00)
Glucose, Bld: 125 mg/dL — ABNORMAL HIGH (ref 70–99)
Potassium: 4.3 mEq/L (ref 3.5–5.1)
Sodium: 141 mEq/L (ref 135–145)
Total Bilirubin: 0.6 mg/dL (ref 0.2–1.2)
Total Protein: 7.9 g/dL (ref 6.0–8.3)

## 2020-11-18 LAB — LIPID PANEL
Cholesterol: 121 mg/dL (ref 0–200)
HDL: 31.7 mg/dL — ABNORMAL LOW (ref >39.00)
LDL Cholesterol: 74 mg/dL (ref 0–99)
NonHDL: 89.13
Total CHOL/HDL Ratio: 4
Triglycerides: 77 mg/dL (ref 0.0–149.0)
VLDL: 15.4 mg/dL (ref 0.0–40.0)

## 2020-11-18 LAB — HEMOGLOBIN A1C: Hgb A1c MFr Bld: 6.5 % (ref 4.6–6.5)

## 2020-11-18 MED ORDER — TRAMADOL HCL 50 MG PO TABS: 50.0000 mg | ORAL_TABLET | Freq: Four times a day (QID) | ORAL | 4 refills | Status: DC | PRN

## 2020-11-18 NOTE — Assessment & Plan Note (Signed)
Doing cardio rehab. Taking Ca and Vit D

## 2020-11-18 NOTE — Patient Instructions (Addendum)
Call [facility] to schedule the dexa -- see if you can get mammogram   Ok to take tramadol 3 times a day - but try to limit to twice a day  Try non medication approaches - if no improvement message and can try medication Urinary Incontinence in Women  Risk Factors - Cannot change: age, pregnancy history, history of hysterectomy - Possible factors you can change: overall health, diuretic use, high impact exercise, weight   General Treatment  1) Appropriate Fluid intake - drink 50-70 oz daily, but spread out how the fluid is consumed 2) Constipation Management - make sure you get fiber in your diet and if constipated a stool softener 3) Weight loss (if overweight) 4) Pelvic Floor strengthening --  ---- Kegel Exercises (practice by holding urine during urination -- feel the pelvic floor muscle contraction). Perform 3 sets of 8-10 contractions working your way to holding each contraction for 10 seconds. Do for 15-20 weeks -----> If you have trouble doing this, try them laying down. Lay down on your back with your feet flat on the ground and knees bent.    Stress Incontinence - Cause: urethral sphincter weakness (muscle weakness) - Symptoms: loss of urine due to specific activities - like coughing, sneezing, exercise, laughing - Medications that can worse: decongestants - Treatment:  >> Kegel exercises or Physical therapy >> Medications: no FDA approved medications >> Surgery evaluation if not improved with physical therapy  Urge Incontinence - Cause: detrusor overactivity (over-active bladder) - Symptoms: lose urine on the way to the bathroom, more frequent trips, urinating overnight - Medications/Substances that worsen: alcohol, caffeine, Diuretics - Treatment >>> Kegel Exercises and Bladder training >>> Bladder Training: Remain stationary when urgency occurs, concentrate on decreasing the urge (Do 3 quick Kegels and 2 deep belly breaths), once controlled walk slowly to the bathroom.  Eventually try to wait 30-60 minutes when the urge occurs >>> In patients with cognitive impairment: prompted voiding (remind regular use of the bathroom), scheduled voiding (take them to the bathroom every 2-3 hours) >>> Medications: oxybutynin, darifenacin, mirabegron

## 2020-11-18 NOTE — Progress Notes (Signed)
Annual Exam   Chief Complaint:  Chief Complaint  Patient presents with  . Medicare Wellness    History of Present Illness:  Ms. Hannah Vasquez is a 70 y.o. [phone] who LMP was No LMP recorded (lmp unknown). Patient has had a hysterectomy., presents today for her annual examination.    Had back injection yesterday   Nutrition She does get adequate calcium and Vitamin D in her diet. Diet: not great recently Exercise: back exercises, cardio rehab  Safety The patient wears seatbelts: yes.     The patient feels safe at home and in their relationships: yes.  Dentist: yes Eye doctor: yes   Menstrual:  Post menopauseal  GYN She is not sexually active.    Cervical Cancer Screening (21-65):   Aged out  Breast Cancer Screening (Age 33-74):  There is no FH of breast cancer. There is no FH of ovarian cancer. BRCA screening Not Indicated.  Last Mammogram: 11/2019 The patient does want a mammogram this year.    Colon Cancer Screening:  Age 37-75 yo - benefits outweigh the risk. Adults 58-85 yo who have never been screened benefit.  Benefits: 134000 people in 2016 will be diagnosed and 49,000 will die - early detection helps [place]: Complications 2/2 to colonoscopy High Risk (Colonoscopy): genetic disorder (Lynch syndrome or familial adenomatous polyposis), personal hx of IBD, previous adenomatous polyp, or previous colorectal cancer, FamHx start 10 years before the age at diagnosis, increased in males and black race  Options:  FIT - looks for hemoglobin (blood in the stool) - specific and fairly sensitive - must be done annually Cologuard - looks for DNA and blood - more sensitive - therefore can have more false positives, every 3 years Colonoscopy - every 10 years if normal - sedation, bowl prep, must have someone drive you  Shared decision making and the patient had decided to do colonoscopy - due 2028.   Social History   Tobacco Use  Smoking Status Never Smoker   Smokeless Tobacco Never Used    Lung Cancer Screening (Ages 03-15): not applicable   Weight Wt Readings from Last 3 Encounters:  11/18/20 152 lb 8 oz (69.2 kg)  07/29/20 156 lb (70.8 kg)  06/25/20 156 lb 4 oz (70.9 kg)   Patient has high BMI  BMI Readings from Last 1 Encounters:  11/18/20 28.35 kg/m     Chronic disease screening Blood pressure monitoring:  BP Readings from Last 3 Encounters:  11/18/20 122/70  06/25/20 120/70  04/29/20 122/62    Lipid Monitoring: Indication for screening: age >53, obesity, diabetes, family hx, CV risk factors.  Lipid screening: Yes  Lab Results  Component Value Date   CHOL 115 11/09/2019   HDL 30 (L) 11/09/2019   LDLCALC 63 11/09/2019   LDLDIRECT 151.0 09/09/2011   TRIG 123 11/09/2019   CHOLHDL 3.8 11/09/2019     Diabetes Screening: age >92, overweight, family hx, PCOS, hx of gestational diabetes, at risk ethnicity Diabetes Screening screening: Yes  Lab Results  Component Value Date   HGBA1C 5.7 (A) 03/25/2020     Past Medical History:  Diagnosis Date  . Allergy   . Anxiety   . CAD (coronary artery disease)   . Cataract   . Does use hearing aid    bilateral as of 10/05/2019  . Elevated alkaline phosphatase level   . Hemorrhoids   . Hepatic steatosis   . History of MI (myocardial infarction) 10/07/2017  . Hypertension   . Mitral valve  prolapse   . Osteoporosis   . Status post dilation of esophageal narrowing     Past Surgical History:  Procedure Laterality Date  . BASAL CELL CARCINOMA EXCISION  02/05/2016   Dr. Sarajane Jews, [facility]  . BLADDER SURGERY  11/15/1995   bladder neck suspension, urinary incontinence  . CARDIAC CATHETERIZATION  01/03/2017 DES LAD  . CATARACT EXTRACTION Left 04/30/2016   Dr. Tommy Rainwater, [place] N/A 01/03/2017   Procedure: Coronary Stent Intervention;  Surgeon: Nelva Bush, MD;  Location: [place] [facility];  Service: Cardiovascular;   Laterality: N/A;  . DENTAL SURGERY  06/20/2016   Tooth Implant; Dr. Kalman Shan  . INTRAVASCULAR ULTRASOUND/IVUS N/A 01/03/2017   Procedure: Intravascular Ultrasound/IVUS;  Surgeon: Nelva Bush, MD;  Location: [place] [facility];  Service: Cardiovascular;  Laterality: N/A;  . KNEE SURGERY  2003   torn meniscus  . LEFT HEART CATH AND CORONARY ANGIOGRAPHY N/A 01/03/2017   Procedure: Left Heart Cath and Coronary Angiography;  Surgeon: Nelva Bush, MD;  Location: [place] [facility];  Service: Cardiovascular;  Laterality: N/A;  . MOHS SURGERY Right 2017   right side of face  . ROTATOR CUFF REPAIR    . SHOULDER SURGERY Right 2005    Prior to Admission medications   Medication Sig Start Date End Date Taking? Authorizing Provider  albuterol (VENTOLIN HFA) 108 (90 Base) MCG/ACT inhaler Inhale 1 puff into the lungs every 6 (six) hours as needed for wheezing or shortness of breath. 12/12/19  Yes Lesleigh Noe, MD  amLODipine (NORVASC) 5 MG tablet Take 1 tablet (5 mg total) by mouth daily. 09/24/20  Yes Lesleigh Noe, MD  aspirin EC 81 MG tablet Take 81 mg by mouth every morning.    Yes [provider]  atorvastatin (LIPITOR) 80 MG tablet TAKE 1 TABLET BY MOUTH ONCE A DAY AT [facility] THE EVENING. 04/28/20  Yes Burtis Junes, NP  Calcium-Magnesium-Vitamin D (CALCIUM 1200+D3 PO) Take 1,200 mg by mouth.   Yes [provider]  Cholecalciferol (VITAMIN D3) 1000 UNITS CAPS Take 1 capsule by mouth every morning.    Yes [provider]  fluticasone (FLONASE) 50 MCG/ACT nasal spray Place 1 spray into both nostrils as needed.   Yes [provider]  furosemide (LASIX) 20 MG tablet Take 1 tablet (20 mg total) by mouth daily as needed. 01/01/19  Yes Jerline Pain, MD  gabapentin (NEURONTIN) 300 MG capsule Take 1 capsule (300 mg total) by mouth 3 (three) times daily. 06/05/20  Yes Lesleigh Noe, MD  isosorbide mononitrate (IMDUR) 30 MG 24 hr tablet Take 1 tablet (30 mg total)  by mouth daily. 07/21/20  Yes Lesleigh Noe, MD  loratadine (CLARITIN) 10 MG tablet Take 1 tablet (10 mg total) by mouth every morning. 11/08/13  Yes Lucille Passy, MD  losartan (COZAAR) 100 MG tablet TAKE 1 TABLET BY MOUTH ONCE A DAY 09/24/20  Yes Jerline Pain, MD  metoprolol succinate (TOPROL-XL) 25 MG 24 hr tablet Take 1 tablet (25 mg total) by mouth daily. 07/21/20  Yes Lesleigh Noe, MD  Multiple Vitamin (MULTIVITAMIN) tablet Take 1 tablet by mouth every morning.    Yes [provider]  nitroGLYCERIN (NITROSTAT) 0.4 MG SL tablet Place 1 tablet (0.4 mg total) under the tongue every 5 (five) minutes as needed for chest pain. 07/02/19  Yes Burtis Junes, NP  Omega-3 Fatty Acids (FISH OIL) 1200 MG CAPS Take 1 capsule by  mouth every morning.    Yes [provider]  omeprazole (PRILOSEC) 40 MG capsule Take 1 capsule (40 mg total) by mouth 2 (two) times daily. 04/29/20  Yes Zehr, Janett Billow D, PA-C  potassium chloride SA (KLOR-CON) 20 MEQ tablet TAKE 2 TABLETS BY MOUTH TWICE DAILY 01/29/20  Yes Jerline Pain, MD  traMADol (ULTRAM) 50 MG tablet TAKE 1 TABLET BY MOUTH EVERY 6 HOURS AS NEEDED FOR SEVERE PAIN. 09/25/20  Yes Lesleigh Noe, MD  vitamin B-12 (CYANOCOBALAMIN) 100 MCG tablet Take 1 tablet (100 mcg total) by mouth daily. 08/28/18  Yes Burtis Junes, NP  hydrochlorothiazide (HYDRODIURIL) 25 MG tablet Take 1 tablet (25 mg total) by mouth daily. 07/21/20 10/19/20  Lesleigh Noe, MD    Allergies  Allergen Reactions  . Promethazine-Dm Nausea And Vomiting  . Bactroban [Mupirocin Calcium]     Facial redness and swelling  . Codeine     REACTION: nausea and vomiting  . Penicillins Nausea And Vomiting    REACTION: rash  . Valtrex [Valacyclovir Hcl] Swelling    Per pt causes redness    Gynecologic History: No LMP recorded (lmp unknown). Patient has had a hysterectomy.  Obstetric History: [id number]  Social History   Socioeconomic History  . Marital status: Divorced     Spouse name: Not on file  . Number of children: 2  . Years of education: high school  . Highest education level: Not on file  Occupational History  . Occupation: Medical office---front desk    Comment: Retired  Tobacco Use  . Smoking status: Never Smoker  . Smokeless tobacco: Never Used  Vaping Use  . Vaping Use: Never used  Substance and Sexual Activity  . Alcohol use: No    Alcohol/week: 0.0 standard drinks  . Drug use: No  . Sexual activity: Not Currently    Birth control/protection: Post-menopausal  Other Topics Concern  . 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 Difficulty of Paying Living Expenses: Not hard at all  Food Insecurity: No Food Insecurity  . Worried About Charity fundraiser in the Last Year: Never true  . Ran Out of Food in the Last Year: Never true  Transportation Needs: No Transportation Needs  . Lack of Transportation (Medical): No  . Lack of Transportation (Non-Medical): No  Physical Activity: Insufficiently Active  . Days of Exercise per Week: 2 days  . Minutes of Exercise per Session: 50 min  Stress: No Stress Concern Present  . Feeling of Stress : Not at all  Social Connections: Not on file  Intimate Partner Violence: Not At Risk  . Fear of Current or Ex-Partner: No  . Emotionally Abused: No  . Physically Abused: No  . Sexually Abused: No    Family History  Problem Relation  Age of Onset  . Mitral valve prolapse Mother   .  Hypertension Mother   . Hypertension Father   . Brain cancer Father   . Colon cancer Neg Hx   . Stomach cancer Neg Hx   . Esophageal cancer Neg Hx   . Pancreatic cancer Neg Hx     Review of Systems  Constitutional: Negative for chills and fever.  HENT: Negative for congestion and sore throat.   Eyes: Negative for blurred vision and double vision.  Respiratory: Negative for shortness of breath.   Cardiovascular: Negative for chest pain.  Gastrointestinal: Negative for heartburn, nausea and vomiting.  Genitourinary: Negative.   Musculoskeletal: Negative.  Negative for myalgias.       Leg discomfort  Skin: Negative for rash.  Neurological: Negative for dizziness and headaches.  Endo/Heme/Allergies: Does not bruise/bleed easily.  Psychiatric/Behavioral: Negative for depression. The patient is not nervous/anxious.      Physical Exam BP 122/70   Pulse 87   Temp 98.5 F (36.9 C) (Temporal)   Ht 5' 1.5" (1.562 m)   Wt 152 lb 8 oz (69.2 kg)   LMP  (LMP Unknown)   SpO2 97%   BMI 28.35 kg/m    BP Readings from Last 3 Encounters:  11/18/20 122/70  06/25/20 120/70  04/29/20 122/62      Physical Exam Constitutional:      General: She is not in acute distress.    Appearance: She is well-developed. She is not diaphoretic.  HENT:     Head: Normocephalic and atraumatic.     Right Ear: External ear normal.     Left Ear: External ear normal.     Nose: Nose normal.  Eyes:     General: No scleral icterus.    Conjunctiva/sclera: Conjunctivae normal.  Cardiovascular:     Rate and Rhythm: Normal rate and regular rhythm.     Heart sounds: No murmur heard.   Pulmonary:     Effort: Pulmonary effort is normal. No respiratory distress.     Breath sounds: Normal breath sounds. No wheezing.  Abdominal:     General: Bowel sounds are normal. There is no distension.     Palpations: Abdomen is soft. There is no mass.     Tenderness: There is no abdominal tenderness. There is no  guarding or rebound.  Musculoskeletal:        General: Normal range of motion.     Cervical back: Neck supple.  Lymphadenopathy:     Cervical: No cervical adenopathy.  Skin:    General: Skin is warm and dry.     Capillary Refill: Capillary refill takes less than 2 seconds.  Neurological:     Mental Status: She is alert and oriented to person, place, and time.     Deep Tendon Reflexes: Reflexes normal.  Psychiatric:        Behavior: Behavior normal.     Results:  PHQ-9:  Flowsheet Row Clinical Support from 11/11/2020 in [place] at [place]  PHQ-9 Total Score 0        Assessment: 70 y.o. [phone] female here for routine annual physical examination.  Plan: Problem List Items Addressed This Visit      Cardiovascular and Mediastinum   HYPERTENSION, BENIGN   Relevant Orders   Comprehensive metabolic panel     Nervous and Auditory   Lumbar radiculopathy   Relevant Medications   traMADol (ULTRAM) 50 MG tablet     Musculoskeletal and Integument   Osteoporosis  Doing cardio rehab. Taking Ca and Vit D      Relevant Orders   DG Bone Density     Other   Prediabetes   Relevant Orders   Hemoglobin A1c    Other Visit Diagnoses    Annual physical exam    -  Primary   Mixed hyperlipidemia       Relevant Orders   Lipid panel      Screening: -- Blood pressure screen normal -- cholesterol screening: will obtain -- Weight screening: overweight: continue to monitor -- Diabetes Screening: will obtain -- Nutrition: Encouraged healthy diet  The ASCVD Risk score Mikey Bussing DC Jr., et al., 2013) failed to calculate for the following reasons:   The patient has a prior MI or stroke diagnosis  -- Statin therapy for Age 66-75 with CVD risk >7.5%  Psych -- Depression screening (PHQ-9):  Flowsheet Row Clinical Support from 11/11/2020 in [place] at [facility]  PHQ-9 Total Score 0       Safety -- tobacco screening: not using -- alcohol screening:   low-risk usage. -- no evidence of domestic violence or intimate partner violence.   Cancer Screening -- pap smear not collected per ASCCP guidelines -- family history of breast cancer screening: done. not at high risk. -- Mammogram - will get -- Colon cancer (age 27+)-- up to date  Immunizations Immunization History  Administered Date(s) Administered  . Fluad Quad(high Dose 65+) 04/09/2019, 04/26/2020  . Influenza Split 05/27/2011  . Influenza Whole 06/04/2009  . Influenza,inj,Quad PF,6+ Mos 06/07/2013, 06/12/2014, 05/09/2015, 06/11/2016, 06/03/2017, 04/27/2018  . Pneumococcal Conjugate-13 11/27/2014  . Pneumococcal Polysaccharide-23 11/08/2013  . Td 08/16/1993  . Tdap 09/09/2011  . Zoster 11/19/2009    -- flu vaccine up to date -- TDAP q10 years up to date -- Shingles (age >67) not up to date - will get at pharmacy -- PPSV-23 (19-64 with chronic disease or smoking) up to date -- PCV-13 (age >55) - one dose followed by PPSV-23 1 year later up to date -- Covid-19 Vaccine declined   Encouraged healthy diet and exercise. Encouraged regular vision and dental care.    Lesleigh Noe, MD

## 2020-11-20 DIAGNOSIS — Z780 Asymptomatic menopausal state: Secondary | ICD-10-CM | POA: Diagnosis not present

## 2020-11-20 DIAGNOSIS — Z1231 Encounter for screening mammogram for malignant neoplasm of breast: Secondary | ICD-10-CM | POA: Diagnosis not present

## 2020-11-20 DIAGNOSIS — M8589 Other specified disorders of bone density and structure, multiple sites: Secondary | ICD-10-CM | POA: Diagnosis not present

## 2020-11-20 LAB — HM MAMMOGRAPHY

## 2020-11-20 LAB — HM DEXA SCAN

## 2020-11-24 ENCOUNTER — Encounter: Payer: Self-pay | Admitting: Family Medicine

## 2020-11-27 DIAGNOSIS — G4733 Obstructive sleep apnea (adult) (pediatric): Secondary | ICD-10-CM | POA: Diagnosis not present

## 2020-12-01 DIAGNOSIS — G4733 Obstructive sleep apnea (adult) (pediatric): Secondary | ICD-10-CM | POA: Diagnosis not present

## 2020-12-03 ENCOUNTER — Encounter: Payer: Self-pay | Admitting: Family Medicine

## 2020-12-09 DIAGNOSIS — M5416 Radiculopathy, lumbar region: Secondary | ICD-10-CM | POA: Diagnosis not present

## 2020-12-10 ENCOUNTER — Telehealth: Payer: Self-pay | Admitting: Family Medicine

## 2020-12-10 NOTE — Telephone Encounter (Signed)
Pt called in wanted to add a vitamin that her back doctor Dr. Ron Agee and he added Turmeric Curcumin 551m

## 2020-12-10 NOTE — Telephone Encounter (Signed)
Added to medication list

## 2020-12-15 ENCOUNTER — Other Ambulatory Visit: Payer: Self-pay | Admitting: Family Medicine

## 2020-12-15 DIAGNOSIS — G629 Polyneuropathy, unspecified: Secondary | ICD-10-CM

## 2020-12-15 NOTE — Telephone Encounter (Signed)
Last Filled - 06/05/20 Last OV - 11/18/2020 Next OV - 05/21/2021

## 2020-12-31 DIAGNOSIS — G4733 Obstructive sleep apnea (adult) (pediatric): Secondary | ICD-10-CM | POA: Diagnosis not present

## 2021-01-06 DIAGNOSIS — M5126 Other intervertebral disc displacement, lumbar region: Secondary | ICD-10-CM | POA: Diagnosis not present

## 2021-01-06 DIAGNOSIS — M5416 Radiculopathy, lumbar region: Secondary | ICD-10-CM | POA: Diagnosis not present

## 2021-01-08 ENCOUNTER — Telehealth: Payer: Self-pay | Admitting: *Deleted

## 2021-01-08 ENCOUNTER — Other Ambulatory Visit: Payer: Self-pay

## 2021-01-08 ENCOUNTER — Telehealth (INDEPENDENT_AMBULATORY_CARE_PROVIDER_SITE_OTHER): Payer: PPO | Admitting: Cardiology

## 2021-01-08 ENCOUNTER — Encounter: Payer: Self-pay | Admitting: Cardiology

## 2021-01-08 VITALS — BP 150/80 | Ht 61.5 in | Wt 155.0 lb

## 2021-01-08 DIAGNOSIS — G4733 Obstructive sleep apnea (adult) (pediatric): Secondary | ICD-10-CM

## 2021-01-08 DIAGNOSIS — G4719 Other hypersomnia: Secondary | ICD-10-CM | POA: Diagnosis not present

## 2021-01-08 DIAGNOSIS — I10 Essential (primary) hypertension: Secondary | ICD-10-CM | POA: Diagnosis not present

## 2021-01-08 NOTE — Progress Notes (Signed)
Virtual Visit via Video Note   This visit type was conducted due to national recommendations for restrictions regarding the COVID-19 Pandemic (e.g. social distancing) in an effort to limit this patient's exposure and mitigate transmission in our community.  Due to her co-morbid illnesses, this patient is at least at moderate risk for complications without adequate follow up.  This format is felt to be most appropriate for this patient at this time.  All issues noted in this document were discussed and addressed.  A limited physical exam was performed with this format.  Please refer to the patient's chart for her consent to telehealth for [facility].   Date:  01/08/2021   ID:  [name], DOB [birth year], MRN [id number] The patient was identified using 2 identifiers.  Patient Location: Home Provider Location: Office/Clinic   Cardiology Office Note:    Date:  01/08/2021   ID:  [name], DOB October 07, 1950, MRN [id number]  PCP:  Lesleigh Noe, MD  Cardiologist:  Candee Furbish, MD    Referring MD: Lesleigh Noe, MD   Chief Complaint  Patient presents with  . Sleep Apnea  . Hypertension    History of Present Illness:    YEVA BISSETTE is a 70 y.o. female with a hx of ASCAD, anxiety, HTN and MVP who was seen by Dr. Marlou Porch last summer and complained of excessive daytime sleepiness and nonrestorative sleep.  She says that on occasion she would wake up from snoring.  She did not wake up gasping for breath and no headaches in the am.  A home sleep study was ordered which showed moderate OSA with an AHI of 18.7/hr and nocturnal hypoxemia with O2 sats as low as 78%.  She was placed on auto CPAP from 4 to 20cm H2O.    She is now here for initial followup. She is doing well with her CPAP device and thinks that she has gotten used to it.  She says that there have been a few nights when she did not use it. She tolerates the nasal pillow mask except for some nasal dryness and went to the  pharmacy and got nasal gel which has helped.  A few mornings she awakened and her chest felt tight when she would breathe.  She also notices that her mouth is dry at night and has to wake up and drink water and sometimes stops using it during the night because her mouth is so dry.  She feels the pressure is adequate.  Since going on CPAP she feels rested in the am and has no significant daytime sleepiness.  She does not think that he snores.     Past Medical History:  Diagnosis Date  . Allergy   . Anxiety   . CAD (coronary artery disease)   . Cataract   . Does use hearing aid    bilateral as of 10/05/2019  . Elevated alkaline phosphatase level   . Hemorrhoids   . Hepatic steatosis   . History of MI (myocardial infarction) 10/07/2017  . Hypertension   . Mitral valve prolapse   . Osteoporosis   . Status post dilation of esophageal narrowing     Past Surgical History:  Procedure Laterality Date  . BASAL CELL CARCINOMA EXCISION  02/05/2016   Dr. Sarajane Jews, [facility]  . BLADDER SURGERY  11/15/1995   bladder neck suspension, urinary incontinence  . CARDIAC CATHETERIZATION  01/03/2017 DES LAD  . CATARACT EXTRACTION Left 04/30/2016  Dr. Tommy Rainwater, [place] N/A 01/03/2017   Procedure: Coronary Stent Intervention;  Surgeon: Nelva Bush, MD;  Location: [place] [facility];  Service: Cardiovascular;  Laterality: N/A;  . DENTAL SURGERY  06/20/2016   Tooth Implant; Dr. Kalman Shan  . INTRAVASCULAR ULTRASOUND/IVUS N/A 01/03/2017   Procedure: Intravascular Ultrasound/IVUS;  Surgeon: Nelva Bush, MD;  Location: [place] [facility];  Service: Cardiovascular;  Laterality: N/A;  . KNEE SURGERY  2003   torn meniscus  . LEFT HEART CATH AND CORONARY ANGIOGRAPHY N/A 01/03/2017   Procedure: Left Heart Cath and Coronary Angiography;  Surgeon: Nelva Bush, MD;  Location: [place] [facility];  Service: Cardiovascular;  Laterality: N/A;  . MOHS SURGERY Right  2017   right side of face  . ROTATOR CUFF REPAIR    . SHOULDER SURGERY Right 2005    Current Medications: Current Meds  Medication Sig  . albuterol (VENTOLIN HFA) 108 (90 Base) MCG/ACT inhaler Inhale 1 puff into the lungs every 6 (six) hours as needed for wheezing or shortness of breath.  Marland Kitchen amLODipine (NORVASC) 5 MG tablet Take 1 tablet (5 mg total) by mouth daily.  Marland Kitchen aspirin EC 81 MG tablet Take 81 mg by mouth every morning.   Marland Kitchen atorvastatin (LIPITOR) 80 MG tablet TAKE 1 TABLET BY MOUTH ONCE A DAY AT [facility] THE EVENING.  . Calcium-Magnesium-Vitamin D (CALCIUM 1200+D3 PO) Take 1,200 mg by mouth.  . Cholecalciferol (VITAMIN D3) 1000 UNITS CAPS Take 1 capsule by mouth every morning.   . fluticasone (FLONASE) 50 MCG/ACT nasal spray Place 1 spray into both nostrils as needed.  . furosemide (LASIX) 20 MG tablet Take 1 tablet (20 mg total) by mouth daily as needed.  . gabapentin (NEURONTIN) 300 MG capsule TAKE 1 CAPSULE BY MOUTH 3 TIMES A DAY  . hydrochlorothiazide (HYDRODIURIL) 25 MG tablet Take 1 tablet (25 mg total) by mouth daily.  . isosorbide mononitrate (IMDUR) 30 MG 24 hr tablet Take 1 tablet (30 mg total) by mouth daily.  Marland Kitchen loratadine (CLARITIN) 10 MG tablet Take 1 tablet (10 mg total) by mouth every morning.  Marland Kitchen losartan (COZAAR) 100 MG tablet TAKE 1 TABLET BY MOUTH ONCE A DAY  . metoprolol succinate (TOPROL-XL) 25 MG 24 hr tablet Take 1 tablet (25 mg total) by mouth daily.  . Multiple Vitamin (MULTIVITAMIN) tablet Take 1 tablet by mouth every morning.   . nitroGLYCERIN (NITROSTAT) 0.4 MG SL tablet Place 1 tablet (0.4 mg total) under the tongue every 5 (five) minutes as needed for chest pain.  . Omega-3 Fatty Acids (FISH OIL) 1200 MG CAPS Take 1 capsule by mouth every morning.   Marland Kitchen omeprazole (PRILOSEC) 40 MG capsule Take 1 capsule (40 mg total) by mouth 2 (two) times daily.  . potassium chloride SA (KLOR-CON) 20 MEQ tablet TAKE 2 TABLETS BY MOUTH TWICE DAILY  . traMADol (ULTRAM) 50 MG  tablet Take 1 tablet (50 mg total) by mouth every 6 (six) hours as needed for severe pain.  . Turmeric Curcumin 500 MG CAPS Take 1 capsule by mouth daily.  . vitamin B-12 (CYANOCOBALAMIN) 100 MCG tablet Take 1 tablet (100 mcg total) by mouth daily.     Allergies:   Promethazine-dm, Bactroban [mupirocin calcium], Codeine, Penicillins, and Valtrex [valacyclovir hcl]   Social History   Socioeconomic History  . Marital status: Divorced    Spouse name: Not on file  . Number of children: 2  . Years of education: high school  . Highest  education level: Not on file  Occupational History  . Occupation: Medical office---front desk    Comment: Retired  Tobacco Use  . Smoking status: Never Smoker  . Smokeless tobacco: Never Used  Vaping Use  . Vaping Use: Never used  Substance and Sexual Activity  . Alcohol use: No    Alcohol/week: 0.0 standard drinks  . Drug use: No  . Sexual activity: Not Currently    Birth control/protection: Post-menopausal  Other Topics Concern  . 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 Difficulty of Paying Living Expenses: Not hard at all  Food Insecurity: No Food Insecurity  . Worried About Charity fundraiser in the Last Year: Never true  . Ran Out of Food in the Last Year: Never true  Transportation Needs: No Transportation Needs  . Lack of Transportation (Medical): No  . Lack of Transportation (Non-Medical): No  Physical Activity: Insufficiently  Active  . Days of Exercise per Week: 2 days  . Minutes of Exercise per Session: 50 min  Stress: No Stress Concern Present  . Feeling of Stress : Not at all  Social Connections: Not on file     Family History: The patient's family history includes Brain cancer in her father; Hypertension in her father and mother; Mitral valve prolapse in her mother. There is no history of Colon cancer, Stomach cancer, Esophageal cancer, or Pancreatic cancer.  ROS:   Please see the history of present illness.    ROS  All other systems reviewed and negative.   EKGs/Labs/Other Studies Reviewed:    The following studies were reviewed today: Home sleep study and PAP download  EKG:  EKG is not ordered today  Recent Labs: 01/25/2020: Hemoglobin 12.9; Platelets 379; TSH 2.400 11/18/2020: ALT 34; BUN 16; Creatinine, Ser 1.03; Potassium 4.3; Sodium 141   Recent Lipid Panel    Component Value Date/Time   CHOL 121 11/18/2020 1208   CHOL 115 11/09/2019 0844   TRIG 77.0 11/18/2020 1208   HDL 31.70 (L) 11/18/2020 1208   HDL 30 (L) 11/09/2019 0844   CHOLHDL 4 11/18/2020 1208   VLDL 15.4 11/18/2020 1208   LDLCALC 74 11/18/2020 1208   LDLCALC 63 11/09/2019 0844   LDLDIRECT 151.0 09/09/2011 0919    CHA2DS2-VASc Score =   [ ] .  Therefore, the patient's annual risk of stroke is   %.        Physical Exam:    VS:  BP (!) 150/80   Ht 5' 1.5" (1.562 m)   Wt 155 lb (70.3 kg)   LMP  (LMP Unknown)   SpO2 98%   BMI 28.81 kg/m     Wt Readings from Last 3 Encounters:  01/08/21 155 lb (70.3 kg)  11/18/20 152 lb 8 oz (69.2 kg)  07/29/20 156  lb (70.8 kg)     Well nourished, well developed female in no acute distress. Well appearing, alert and conversant, regular work of breathing,  good skin color  Eyes- anicteric mouth- oral mucosa is pink  neuro- grossly intact skin- no apparent rash or lesions or cyanosis  ASSESSMENT:    1. OSA (obstructive sleep apnea)   2. HYPERTENSION, BENIGN   3. Excessive  daytime sleepiness    PLAN:    In order of problems listed above:  OSA - The patient is tolerating PAP therapy well without any problems. The PAP download performed by his DME was personally reviewed and interpreted by me today and showed an AHI of 4.9/hr on auto CPAP  with 67% compliance in using more than 4 hours nightly.  The average pressure used was 10cm H2O.  The patient has been using and benefiting from PAP use and will continue to benefit from therapy.  -I encouraged her to be more compliant with her device -I will decrease her CPAP range to 4-14cm H2O to see if that helps with feeling of her chest aching when she takes a breath in -I encouraged her to try to tighten up her chin strap some to help with dry mouth -Continue nasal gel or nasal saline spray for dry nose -I have encouraged her to increase compliance  -I also encouraged her to adjust the humidity on her device  HTN -BP is borderline controlled on exam today>>she says that she always gets nervous with her virtual visits that her technology will not work and most of the time her BP is controlled -Continue prescription drug management with amlodipine 94m daily, HCTZ 230mdaily, Losartan 10070maily, Toprol XL 24m28mily -I have personally reviewed and interpreted outside labs performed by patient's PCP which showed SCr 1.03, K+ 4.3 and Hbg 12.9 -I encouraged her to follow a < 2gm Na diet  Excessive daytime sleepiness -I have personally reviewed and interpreted outside labs performed by patient's PCP which showed Hbg 12.9 and TSH 2.4 from 1 year ago  -likely etiology of patient's sleepiness is OSA which is now adequately treated with improvement in her daytime sleepiness -continue CPAP  Followup with me in 6 weeks   COVID-19 Education: The signs and symptoms of COVID-19 were discussed with the patient and how to seek care for testing (follow up with PCP or arrange E-visit).  The importance of social distancing was  discussed today.  Time:   Today, I have spent 15 minutes with the patient with telehealth technology discussing the above problems and another 10 minutes reviewing sleep study, PAP download and outside labs from PCP  Medication Adjustments/Labs and Tests Ordered: Current medicines are reviewed at length with the patient today.  Concerns regarding medicines are outlined above.  No orders of the defined types were placed in this encounter.  No orders of the defined types were placed in this encounter.  Followup with me in 1 year  Signed, TracFransico Him  01/08/2021 8:40 AM    [place]

## 2021-01-08 NOTE — Telephone Encounter (Signed)
Order placed to [facility] via community message 

## 2021-01-08 NOTE — Telephone Encounter (Signed)
-----   Message from Antonieta Iba, RN sent at 01/08/2021  8:53 AM EDT ----- Per Dr. Radford Pax: change auto PAP to 4-14cm H2O and followup with me in 6 weeks Thanks!

## 2021-01-08 NOTE — Patient Instructions (Signed)
Medication Instructions:  Your physician recommends that you continue on your current medications as directed. Please refer to the Current Medication list given to you today.  *If you need a refill on your cardiac medications before your next appointment, please call your pharmacy*  Follow-Up: At [facility], you and your health needs are our priority.  As part of our continuing mission to provide you with exceptional heart care, we have created designated Provider Care Teams.  These Care Teams include your primary Cardiologist (physician) and Advanced Practice Providers (APPs -  Physician Assistants and Nurse Practitioners) who all work together to provide you with the care you need, when you need it.  Your next appointment:   6 week(s)  The format for your next appointment:   Virtual Visit   Provider:   Fransico Him, MD   Other Instruction Dr. Radford Pax is changing your auto PAP to 4-14cm H2O

## 2021-01-09 DIAGNOSIS — M5126 Other intervertebral disc displacement, lumbar region: Secondary | ICD-10-CM | POA: Diagnosis not present

## 2021-01-09 DIAGNOSIS — M5416 Radiculopathy, lumbar region: Secondary | ICD-10-CM | POA: Diagnosis not present

## 2021-01-19 ENCOUNTER — Other Ambulatory Visit: Payer: Self-pay | Admitting: Gastroenterology

## 2021-01-19 ENCOUNTER — Other Ambulatory Visit: Payer: Self-pay | Admitting: Family Medicine

## 2021-01-29 ENCOUNTER — Telehealth: Payer: Self-pay

## 2021-01-29 ENCOUNTER — Other Ambulatory Visit: Payer: Self-pay

## 2021-01-29 ENCOUNTER — Ambulatory Visit (INDEPENDENT_AMBULATORY_CARE_PROVIDER_SITE_OTHER): Payer: PPO | Admitting: Family Medicine

## 2021-01-29 VITALS — BP 130/70 | HR 70 | Temp 97.9°F | Ht 62.0 in | Wt 157.8 lb

## 2021-01-29 DIAGNOSIS — H9201 Otalgia, right ear: Secondary | ICD-10-CM | POA: Diagnosis not present

## 2021-01-29 DIAGNOSIS — R0602 Shortness of breath: Secondary | ICD-10-CM | POA: Diagnosis not present

## 2021-01-29 DIAGNOSIS — F411 Generalized anxiety disorder: Secondary | ICD-10-CM | POA: Diagnosis not present

## 2021-01-29 DIAGNOSIS — E538 Deficiency of other specified B group vitamins: Secondary | ICD-10-CM

## 2021-01-29 DIAGNOSIS — M8580 Other specified disorders of bone density and structure, unspecified site: Secondary | ICD-10-CM

## 2021-01-29 DIAGNOSIS — I5022 Chronic systolic (congestive) heart failure: Secondary | ICD-10-CM | POA: Diagnosis not present

## 2021-01-29 DIAGNOSIS — G4733 Obstructive sleep apnea (adult) (pediatric): Secondary | ICD-10-CM

## 2021-01-29 DIAGNOSIS — R5383 Other fatigue: Secondary | ICD-10-CM | POA: Diagnosis not present

## 2021-01-29 DIAGNOSIS — R748 Abnormal levels of other serum enzymes: Secondary | ICD-10-CM | POA: Diagnosis not present

## 2021-01-29 LAB — CBC WITH DIFFERENTIAL/PLATELET
Basophils Absolute: 0 10*3/uL (ref 0.0–0.1)
Basophils Relative: 0.3 % (ref 0.0–3.0)
Eosinophils Absolute: 0.2 10*3/uL (ref 0.0–0.7)
Eosinophils Relative: 2 % (ref 0.0–5.0)
HCT: 37.3 % (ref 36.0–46.0)
Hemoglobin: 12.7 g/dL (ref 12.0–15.0)
Lymphocytes Relative: 28.3 % (ref 12.0–46.0)
Lymphs Abs: 2.8 10*3/uL (ref 0.7–4.0)
MCHC: 34 g/dL (ref 30.0–36.0)
MCV: 81.4 fl (ref 78.0–100.0)
Monocytes Absolute: 0.6 10*3/uL (ref 0.1–1.0)
Monocytes Relative: 6.4 % (ref 3.0–12.0)
Neutro Abs: 6.3 10*3/uL (ref 1.4–7.7)
Neutrophils Relative %: 63 % (ref 43.0–77.0)
Platelets: 305 10*3/uL (ref 150.0–400.0)
RBC: 4.58 Mil/uL (ref 3.87–5.11)
RDW: 14.6 % (ref 11.5–15.5)
WBC: 9.9 10*3/uL (ref 4.0–10.5)

## 2021-01-29 LAB — COMPREHENSIVE METABOLIC PANEL
ALT: 36 U/L — ABNORMAL HIGH (ref 0–35)
AST: 53 U/L — ABNORMAL HIGH (ref 0–37)
Albumin: 4.2 g/dL (ref 3.5–5.2)
Alkaline Phosphatase: 140 U/L — ABNORMAL HIGH (ref 39–117)
BUN: 11 mg/dL (ref 6–23)
CO2: 29 mEq/L (ref 19–32)
Calcium: 9.7 mg/dL (ref 8.4–10.5)
Chloride: 102 mEq/L (ref 96–112)
Creatinine, Ser: 0.81 mg/dL (ref 0.40–1.20)
GFR: 72.57 mL/min (ref >60.00)
Glucose, Bld: 121 mg/dL — ABNORMAL HIGH (ref 70–99)
Potassium: 3.7 mEq/L (ref 3.5–5.1)
Sodium: 140 mEq/L (ref 135–145)
Total Bilirubin: 0.7 mg/dL (ref 0.2–1.2)
Total Protein: 7 g/dL (ref 6.0–8.3)

## 2021-01-29 LAB — VITAMIN B12: Vitamin B-12: 1344 pg/mL — ABNORMAL HIGH (ref 211–911)

## 2021-01-29 LAB — VITAMIN D 25 HYDROXY (VIT D DEFICIENCY, FRACTURES): VITD: 114.33 ng/mL (ref 30.00–100.00)

## 2021-01-29 LAB — TSH: TSH: 3.84 u[IU]/mL (ref 0.35–4.50)

## 2021-01-29 LAB — FERRITIN: Ferritin: 97 ng/mL (ref 10.0–291.0)

## 2021-01-29 NOTE — Assessment & Plan Note (Signed)
Using CPAP. Feels sleepiness is improving but not fatigue. Appreciate specialty care

## 2021-01-29 NOTE — Progress Notes (Signed)
Subjective:     Hannah Vasquez is a 70 y.o. female presenting for Fatigue (X couple months ), Ear Pain (R x on and off ), Generalized Body Aches (Has appt with back and spine dr next week, who treats her arthritis. ), and Insomnia (Happens often)     Insomnia   #Fatigue - feels "exhausted" to the point of not leaving her house - skipped a hair appointment yesterday - low energy - endorses some sleepiness - no change with sleep - aches "all over" - does have lower back and hip pain - that she sees a spine doctor for - getting injections - endorses achy symptoms in joints and muscles - denies CP - does notice that her breathing is not as easy as it use to be - [facility] at [place] - has not been going due to the injections -- was going three times a week -- silver sneakers was too much - lead to pain  Endorses some anxiety recently  - worrying about grandchildren - nervous in larger gatherings    # Insomnia  - work up at 2 am and could not go back to sleep - sleeping 5-6 hours daily typically - will occasionally get 7 hours of sleep - does not noticed a difference in energy level when she gets more sleep    #Ear aches - in right ear  - on and off  - has gotten worse recently - also endorses some right sided neck pain and soreness   Review of Systems  Psychiatric/Behavioral:  The patient has insomnia.     Social History   Tobacco Use  Smoking Status Never  Smokeless Tobacco Never        Objective:    BP Readings from Last 3 Encounters:  01/29/21 130/70  01/08/21 (!) 150/80  11/18/20 122/70   Wt Readings from Last 3 Encounters:  01/29/21 157 lb 12 oz (71.6 kg)  01/08/21 155 lb (70.3 kg)  11/18/20 152 lb 8 oz (69.2 kg)    BP 130/70   Pulse 70   Temp 97.9 F (36.6 C) (Temporal)   Ht 5' 2"  (1.575 m)   Wt 157 lb 12 oz (71.6 kg)   LMP  (LMP Unknown)   SpO2 97%   BMI 28.85 kg/m    Physical Exam Constitutional:      General: She is not in  acute distress.    Appearance: She is well-developed. She is not diaphoretic.  HENT:     Right Ear: Tympanic membrane, ear canal and external ear normal.     Left Ear: Tympanic membrane, ear canal and external ear normal.  Eyes:     Conjunctiva/sclera: Conjunctivae normal.     Comments: Conjunctival pallor  Cardiovascular:     Rate and Rhythm: Normal rate and regular rhythm.     Heart sounds: No murmur heard. Pulmonary:     Effort: Pulmonary effort is normal. No respiratory distress.     Breath sounds: Normal breath sounds. No wheezing.  Musculoskeletal:     Cervical back: Neck supple.     Comments: Some swelling of the 3rd digit DIP joint on the right hand  Skin:    General: Skin is warm and dry.     Capillary Refill: Capillary refill takes less than 2 seconds.  Neurological:     Mental Status: She is alert. Mental status is at baseline.     Motor: No weakness.  Psychiatric:        Mood  and Affect: Mood normal.        Behavior: Behavior normal.          Assessment & Plan:   Problem List Items Addressed This Visit       Cardiovascular and Mediastinum   Chronic systolic heart failure ([place])    Appears euvolemic and denies significant exacerbation symptoms as cause of fatigue. Encouraged exercise. Cont furosemide, hctz, imdur, metoprolol, amlodipine, potassium         Respiratory   OSA (obstructive sleep apnea)    Using CPAP. Feels sleepiness is improving but not fatigue. Appreciate specialty care         Musculoskeletal and Integument   Osteopenia   Relevant Orders   Vitamin D, 25-hydroxy     Other   Anxiety state    She notes worsening anxiety but no depression. Handout for non-pharmacy options and number for therapy. F/u if not improving.        Fatigue - Primary    Pt with long-standing fatigue. Treated for OSA w/o significant improvement. Chronic insomnia w/o worsening with less sleep. Also has chronic heart and lung disease which is likely  contributing. Previous ANA normal. She does have OA with chronic pain and is on gabapentin and tramadol for this. Discussed that pain can lead to fatigue and emphasized importance of exercise for overall health and energy and that with chronic conditions exercise is most important. Will repeat TSH as well as anemia and vitamin labs today as some conjunctival pallor. Suspect fatigue is related to medical complexity and multifactorial.        Relevant Orders   CBC with Differential   Ferritin   TSH   Shortness of breath   Relevant Orders   CBC with Differential   Ferritin   Elevated serum alkaline phosphatase level   Relevant Orders   Comprehensive metabolic panel   Vitamin Z61 deficiency   Relevant Orders   Vitamin B12   Other Visit Diagnoses     Right ear pain          Normal ear exam - reassurance  Return if symptoms worsen or fail to improve.  Lesleigh Noe, MD  This visit occurred during the SARS-CoV-2 public health emergency.  Safety protocols were in place, including screening questions prior to the visit, additional usage of staff PPE, and extensive cleaning of exam room while observing appropriate contact time as indicated for disinfecting solutions.

## 2021-01-29 NOTE — Assessment & Plan Note (Signed)
She notes worsening anxiety but no depression. Handout for non-pharmacy options and number for therapy. F/u if not improving.

## 2021-01-29 NOTE — Telephone Encounter (Signed)
Noted mychart to pt to stop supplements

## 2021-01-29 NOTE — Assessment & Plan Note (Signed)
Appears euvolemic and denies significant exacerbation symptoms as cause of fatigue. Encouraged exercise. Cont furosemide, hctz, imdur, metoprolol, amlodipine, potassium

## 2021-01-29 NOTE — Patient Instructions (Signed)
#  Fatigue - would recommend working on regular exercise - labs today - try walking, try lower impact, [place]

## 2021-01-29 NOTE — Telephone Encounter (Signed)
[facility] called @ 1517 with critical results  Vit D 114.33

## 2021-01-29 NOTE — Assessment & Plan Note (Signed)
Pt with long-standing fatigue. Treated for OSA w/o significant improvement. Chronic insomnia w/o worsening with less sleep. Also has chronic heart and lung disease which is likely contributing. Previous ANA normal. She does have OA with chronic pain and is on gabapentin and tramadol for this. Discussed that pain can lead to fatigue and emphasized importance of exercise for overall health and energy and that with chronic conditions exercise is most important. Will repeat TSH as well as anemia and vitamin labs today as some conjunctival pallor. Suspect fatigue is related to medical complexity and multifactorial.

## 2021-01-31 DIAGNOSIS — G4733 Obstructive sleep apnea (adult) (pediatric): Secondary | ICD-10-CM | POA: Diagnosis not present

## 2021-02-03 DIAGNOSIS — M5416 Radiculopathy, lumbar region: Secondary | ICD-10-CM | POA: Diagnosis not present

## 2021-02-13 ENCOUNTER — Other Ambulatory Visit: Payer: Self-pay | Admitting: Cardiology

## 2021-03-02 DIAGNOSIS — G4733 Obstructive sleep apnea (adult) (pediatric): Secondary | ICD-10-CM | POA: Diagnosis not present

## 2021-03-06 ENCOUNTER — Telehealth: Payer: Self-pay | Admitting: *Deleted

## 2021-03-06 ENCOUNTER — Telehealth (INDEPENDENT_AMBULATORY_CARE_PROVIDER_SITE_OTHER): Payer: PPO | Admitting: Cardiology

## 2021-03-06 ENCOUNTER — Encounter: Payer: Self-pay | Admitting: Cardiology

## 2021-03-06 ENCOUNTER — Other Ambulatory Visit: Payer: Self-pay

## 2021-03-06 VITALS — BP 131/68 | HR 71 | Ht 62.0 in | Wt 155.0 lb

## 2021-03-06 DIAGNOSIS — G4719 Other hypersomnia: Secondary | ICD-10-CM

## 2021-03-06 DIAGNOSIS — I10 Essential (primary) hypertension: Secondary | ICD-10-CM | POA: Diagnosis not present

## 2021-03-06 DIAGNOSIS — G4733 Obstructive sleep apnea (adult) (pediatric): Secondary | ICD-10-CM | POA: Diagnosis not present

## 2021-03-06 NOTE — Patient Instructions (Signed)

## 2021-03-06 NOTE — Progress Notes (Signed)
Virtual Visit via Video Note   This visit type was conducted due to national recommendations for restrictions regarding the COVID-19 Pandemic (e.g. social distancing) in an effort to limit this patient's exposure and mitigate transmission in our community.  Due to her co-morbid illnesses, this patient is at least at moderate risk for complications without adequate follow up.  This format is felt to be most appropriate for this patient at this time.  All issues noted in this document were discussed and addressed.  A limited physical exam was performed with this format.  Please refer to the patient's chart for her consent to telehealth for [facility].   Date:  01/08/2021   ID:  Hannah Vasquez, DOB May 10, 1951, MRN [id number]  Patient Location: Home Provider Location: Office/Clinic   Cardiology Office Note:    Date:  03/06/2021   ID:  Hannah Vasquez, Hannah Vasquez November 09, 1950, MRN [id number]  PCP:  Lesleigh Noe, MD  Cardiologist:  Candee Furbish, MD    Referring MD: Lesleigh Noe, MD   Chief Complaint  Patient presents with   Sleep Apnea   Hypertension     History of Present Illness:    Hannah Vasquez is a 70 y.o. female with a hx of ASCAD, anxiety, HTN and MVP who was seen by Dr. Marlou Porch last summer and complained of excessive daytime sleepiness and nonrestorative sleep.  SA home sleep study was ordered which showed moderate OSA with an AHI of 18.7/hr and nocturnal hypoxemia with O2 sats as low as 78%.  She was placed on auto CPAP from 4 to 20cm H2O.    She is doing well with her CPAP device and thinks that she has gotten used to it but says that she still does not sleep well at night.   She tolerates the nasal pillow mask and feels the pressure is adequate.  Since going on CPAP she feels rested in the am and has no significant daytime sleepiness.  She has been having problems with dry mouth as well as dry nose.   Past Medical History:  Diagnosis Date   Allergy    Anxiety    CAD  (coronary artery disease)    Cataract    Does use hearing aid    bilateral as of 10/05/2019   Elevated alkaline phosphatase level    Hemorrhoids    Hepatic steatosis    History of MI (myocardial infarction) 10/07/2017   Hypertension    Mitral valve prolapse    Osteoporosis    Status post dilation of esophageal narrowing     Past Surgical History:  Procedure Laterality Date   BASAL CELL CARCINOMA EXCISION  02/05/2016   Dr. Sarajane Jews, [facility]   BLADDER SURGERY  11/15/1995   bladder neck suspension, urinary incontinence   CARDIAC CATHETERIZATION  01/03/2017 DES LAD   CATARACT EXTRACTION Left 04/30/2016   Dr. Tommy Rainwater, [place] N/A 01/03/2017   Procedure: Coronary Stent Intervention;  Surgeon: Nelva Bush, MD;  Location: [facility];  Service: Cardiovascular;  Laterality: N/A;   DENTAL SURGERY  06/20/2016   Tooth Implant; Dr. Kalman Shan   INTRAVASCULAR ULTRASOUND/IVUS N/A 01/03/2017   Procedure: Intravascular Ultrasound/IVUS;  Surgeon: Nelva Bush, MD;  Location: [place] [facility];  Service: Cardiovascular;  Laterality: N/A;   KNEE SURGERY  2003   torn meniscus   LEFT HEART CATH AND CORONARY ANGIOGRAPHY N/A 01/03/2017   Procedure: Left Heart Cath and Coronary Angiography;  Surgeon:  End, Harrell Gave, MD;  Location: [place] [facility];  Service: Cardiovascular;  Laterality: N/A;   MOHS SURGERY Right 2017   right side of face   ROTATOR CUFF REPAIR     SHOULDER SURGERY Right 2005    Current Medications: Current Meds  Medication Sig   albuterol (VENTOLIN HFA) 108 (90 Base) MCG/ACT inhaler Inhale 1 puff into the lungs every 6 (six) hours as needed for wheezing or shortness of breath.   amLODipine (NORVASC) 5 MG tablet TAKE 1 TABLET BY MOUTH ONCE A DAY   aspirin EC 81 MG tablet Take 81 mg by mouth every morning.    atorvastatin (LIPITOR) 80 MG tablet TAKE 1 TABLET BY MOUTH ONCE A DAY AT [facility] THE EVENING.    Calcium-Magnesium-Vitamin D (CALCIUM 1200+D3 PO) Take 1,200 mg by mouth.   Cholecalciferol (VITAMIN D3) 1000 UNITS CAPS Take 1 capsule by mouth every morning.    fluticasone (FLONASE) 50 MCG/ACT nasal spray Place 1 spray into both nostrils as needed.   furosemide (LASIX) 20 MG tablet Take 1 tablet (20 mg total) by mouth daily as needed.   gabapentin (NEURONTIN) 300 MG capsule TAKE 1 CAPSULE BY MOUTH 3 TIMES A DAY   hydrochlorothiazide (HYDRODIURIL) 25 MG tablet Take 1 tablet (25 mg total) by mouth daily.   isosorbide mononitrate (IMDUR) 30 MG 24 hr tablet Take 1 tablet (30 mg total) by mouth daily.   loratadine (CLARITIN) 10 MG tablet Take 1 tablet (10 mg total) by mouth every morning.   losartan (COZAAR) 100 MG tablet TAKE 1 TABLET BY MOUTH ONCE A DAY   metoprolol succinate (TOPROL-XL) 25 MG 24 hr tablet Take 1 tablet (25 mg total) by mouth daily.   Multiple Vitamin (MULTIVITAMIN) tablet Take 1 tablet by mouth every morning.    nitroGLYCERIN (NITROSTAT) 0.4 MG SL tablet Place 1 tablet (0.4 mg total) under the tongue every 5 (five) minutes as needed for chest pain.   Omega-3 Fatty Acids (FISH OIL) 1200 MG CAPS Take 1 capsule by mouth every morning.    omeprazole (PRILOSEC) 40 MG capsule TAKE 1 CAPSULE BY MOUTH TWICE DAILY   potassium chloride SA (KLOR-CON) 20 MEQ tablet TAKE 2 TABLETS BY MOUTH TWICE DAILY   traMADol (ULTRAM) 50 MG tablet Take 1 tablet (50 mg total) by mouth every 6 (six) hours as needed for severe pain.   Turmeric Curcumin 500 MG CAPS Take 1 capsule by mouth daily.   vitamin B-12 (CYANOCOBALAMIN) 100 MCG tablet Take 1 tablet (100 mcg total) by mouth daily.     Allergies:   Promethazine-dm, Bactroban [mupirocin calcium], Codeine, Penicillins, and Valtrex [valacyclovir hcl]   Social History   Socioeconomic History   Marital status: Divorced    Spouse name: Not on file   Number of children: 2   Years of education: high school   Highest education level: Not on file   Occupational History   Occupation: Medical office---front desk    Comment: Retired  Tobacco Use   Smoking status: Never   Smokeless tobacco: Never  Vaping Use   Vaping Use: Never used  Substance and Sexual Activity   Alcohol use: No    Alcohol/week: 0.0 standard drinks   Drug use: No   Sexual activity: Not Currently    Birth control/protection: Post-menopausal  Other Topics Concern   Not on file  Social History Narrative   10/09/19   From: the area   Living: alone   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      Family: [place] and Probation officer (nearby) - keeps in good touch with them, 3 grandchildren      Enjoys: walking, outdoor activities, family time      Exercise: mostly just walking around the house, in cardio rehab at [facility]   Diet: not great currently      Safety   Seat belts: Yes    Guns: No   Safe in relationships: Yes          Social Determinants of Radio broadcast assistant Strain: Low Risk    Difficulty of Paying Living Expenses: Not hard at all  Food Insecurity: No Food Insecurity   Worried About Charity fundraiser in the Last Year: Never true   [place] in the Last Year: Never true  Transportation Needs: No Transportation Needs   Lack of Transportation (Medical): No   Lack of Transportation (Non-Medical): No  Physical Activity: Insufficiently Active   Days of Exercise per Week: 2 days   Minutes of Exercise per Session: 50 min  Stress: No Stress Concern Present   Feeling of Stress : Not at all  Social Connections: Not on file     Family History: The patient's family history includes Brain cancer in her father; Hypertension in her father and mother; Mitral valve prolapse in her mother. There is no history of Colon cancer, Stomach cancer, Esophageal cancer, or Pancreatic cancer.  ROS:   Please see the history of present illness.    ROS  All other systems reviewed and negative.   EKGs/Labs/Other Studies Reviewed:    The  following studies were reviewed today: Home sleep study and PAP download  EKG:  EKG is not ordered today  Recent Labs: 01/29/2021: ALT 36; BUN 11; Creatinine, Ser 0.81; Hemoglobin 12.7; Platelets 305.0; Potassium 3.7; Sodium 140; TSH 3.84   Recent Lipid Panel    Component Value Date/Time   CHOL 121 11/18/2020 1208   CHOL 115 11/09/2019 0844   TRIG 77.0 11/18/2020 1208   HDL 31.70 (L) 11/18/2020 1208   HDL 30 (L) 11/09/2019 0844   CHOLHDL 4 11/18/2020 1208   VLDL 15.4 11/18/2020 1208   LDLCALC 74 11/18/2020 1208   LDLCALC 63 11/09/2019 0844   LDLDIRECT 151.0 09/09/2011 0919    Physical Exam:    VS:  BP 131/68   Pulse 71   Ht 5' 2"  (1.575 m)   Wt 155 lb (70.3 kg)   LMP  (LMP Unknown)   SpO2 98%   BMI 28.35 kg/m     Wt Readings from Last 3 Encounters:  03/06/21 155 lb (70.3 kg)  01/29/21 157 lb 12 oz (71.6 kg)  01/08/21 155 lb (70.3 kg)   Well nourished, well developed female in no acute distress. Well appearing, alert and conversant, regular work of breathing,  good skin color  Eyes- anicteric mouth- oral mucosa is pink  neuro- grossly intact skin- no apparent rash or lesions or cyanosis  ASSESSMENT:    1. OSA (obstructive sleep apnea)   2. HYPERTENSION, BENIGN   3. Excessive daytime sleepiness    PLAN:    In order of problems listed above:  OSA - The patient is tolerating PAP therapy well without any problems. The PAP download performed by his DME was personally reviewed and interpreted by me today and showed an AHI of 7.1/hr on auto CPAP cm H2O with 77% compliance in using more than 4 hours nightly.  The patient has been using and  benefiting from PAP use and will continue to benefit from therapy.  -I will add a chin strap as I think she is likely breathing some through her mouth since her mouth is dry when she gets up -I encouraged her to use nasal saline spray and adjust the humidity on her device to help with nose dryness   HTN -BP is well controlled on  exam today -Continue prescription drug management with Amlodipine 26m daily, HCTZ 27mdaily, Losartan 10014maily and Toprol Xl 10m47mily with PRN refills -I have personally reviewed and interpreted outside labs performed by patient's PCP which showed SCr 0.81, K+ 3.7 and TSH 3.84 in June 2022    Excessive daytime sleepiness -I have personally reviewed and interpreted outside labs performed by patient's PCP which showed Hbg 12.9 and TSH 2.4 from 1 year ago  -likely etiology of patient's sleepiness is OSA which is now adequately treated with improvement in her daytime sleepiness -continue CPAP  Followup with me in 1 year   COVID-19 Education: The signs and symptoms of COVID-19 were discussed with the patient and how to seek care for testing (follow up with PCP or arrange E-visit).  The importance of social distancing was discussed today.  Time:   Today, I have spent 15 minutes with the patient with telehealth technology discussing the above problems and another 10 minutes reviewing sleep study, PAP download and outside labs from PCP  Medication Adjustments/Labs and Tests Ordered: Current medicines are reviewed at length with the patient today.  Concerns regarding medicines are outlined above.  No orders of the defined types were placed in this encounter.  No orders of the defined types were placed in this encounter.  Followup with me in 1 year  Signed, TracFransico Him  03/06/2021 11:06 AM    [place]

## 2021-03-06 NOTE — Telephone Encounter (Signed)
-----   Message from Antonieta Iba, RN sent at 03/06/2021 11:27 AM EDT ----- Please order patient and new chin strap.  Thanks!

## 2021-03-10 DIAGNOSIS — M5416 Radiculopathy, lumbar region: Secondary | ICD-10-CM | POA: Diagnosis not present

## 2021-03-18 ENCOUNTER — Ambulatory Visit: Payer: PPO | Admitting: Internal Medicine

## 2021-03-20 ENCOUNTER — Other Ambulatory Visit: Payer: Self-pay

## 2021-03-20 ENCOUNTER — Encounter: Payer: Self-pay | Admitting: Cardiology

## 2021-03-20 ENCOUNTER — Ambulatory Visit: Payer: PPO | Admitting: Cardiology

## 2021-03-20 VITALS — BP 128/78 | HR 62 | Ht 62.0 in | Wt 158.8 lb

## 2021-03-20 DIAGNOSIS — I208 Other forms of angina pectoris: Secondary | ICD-10-CM

## 2021-03-20 DIAGNOSIS — E7800 Pure hypercholesterolemia, unspecified: Secondary | ICD-10-CM

## 2021-03-20 DIAGNOSIS — G4733 Obstructive sleep apnea (adult) (pediatric): Secondary | ICD-10-CM

## 2021-03-20 DIAGNOSIS — I10 Essential (primary) hypertension: Secondary | ICD-10-CM | POA: Diagnosis not present

## 2021-03-20 DIAGNOSIS — I2510 Atherosclerotic heart disease of native coronary artery without angina pectoris: Secondary | ICD-10-CM

## 2021-03-20 NOTE — Patient Instructions (Signed)
Medication Instructions:  The current medical regimen is effective;  continue present plan and medications.  *If you need a refill on your cardiac medications before your next appointment, please call your pharmacy*  Follow-Up: At [facility], you and your health needs are our priority.  As part of our continuing mission to provide you with exceptional heart care, we have created designated Provider Care Teams.  These Care Teams include your primary Cardiologist (physician) and Advanced Practice Providers (APPs -  Physician Assistants and Nurse Practitioners) who all work together to provide you with the care you need, when you need it.  We recommend signing up for the patient portal called "MyChart".  Sign up information is provided on this After Visit Summary.  MyChart is used to connect with patients for Virtual Visits (Telemedicine).  Patients are able to view lab/test results, encounter notes, upcoming appointments, etc.  Non-urgent messages can be sent to your provider as well.   To learn more about what you can do with MyChart, go to [email or web address].    Your next appointment:   1 year(s)  The format for your next appointment:   In Person  Provider:   Candee Furbish, MD   Thank you for choosing [facility]!!   Mediterranean Diet A Mediterranean diet refers to food and lifestyle choices that are based on the traditions of countries located on the [employer]. This way of eating has been shown to help prevent certain conditions and improve outcomes forpeople who have chronic diseases, like kidney disease and heart disease. What are tips for following this plan? Lifestyle Cook and eat meals together with your family, when possible. Drink enough fluid to keep your urine clear or pale yellow. Be physically active every day. This includes: Aerobic exercise like running or swimming. Leisure activities like gardening, walking, or housework. Get 7-8 hours of  sleep each night. If recommended by your health care provider, drink red wine in moderation. This means 1 glass a day for nonpregnant women and 2 glasses a day for men. A glass of wine equals 5 oz (150 mL). Reading food labels  Check the serving size of packaged foods. For foods such as rice and pasta, the serving size refers to the amount of cooked product, not dry. Check the total fat in packaged foods. Avoid foods that have saturated fat or trans fats. Check the ingredients list for added sugars, such as corn syrup.  Shopping At the grocery store, buy most of your food from the areas near the walls of the store. This includes: Fresh fruits and vegetables (produce). Grains, beans, nuts, and seeds. Some of these may be available in unpackaged forms or large amounts (in bulk). Fresh seafood. Poultry and eggs. Low-fat dairy products. Buy whole ingredients instead of prepackaged foods. Buy fresh fruits and vegetables in-season from local farmers markets. Buy frozen fruits and vegetables in resealable bags. If you do not have access to quality fresh seafood, buy precooked frozen shrimp or canned fish, such as tuna, salmon, or sardines. Buy small amounts of raw or cooked vegetables, salads, or olives from the deli or salad bar at your store. Stock your pantry so you always have certain foods on hand, such as olive oil, canned tuna, canned tomatoes, rice, pasta, and beans. Cooking Cook foods with extra-virgin olive oil instead of using butter or other vegetable oils. Have meat as a side dish, and have vegetables or grains as your main dish. This means having meat in small portions or  adding small amounts of meat to foods like pasta or stew. Use beans or vegetables instead of meat in common dishes like chili or lasagna. Experiment with different cooking methods. Try roasting or broiling vegetables instead of steaming or sauteing them. Add frozen vegetables to soups, stews, pasta, or rice. Add  nuts or seeds for added healthy fat at each meal. You can add these to yogurt, salads, or vegetable dishes. Marinate fish or vegetables using olive oil, lemon juice, garlic, and fresh herbs. Meal planning  Plan to eat 1 vegetarian meal one day each week. Try to work up to 2 vegetarian meals, if possible. Eat seafood 2 or more times a week. Have healthy snacks readily available, such as: Vegetable sticks with hummus. Greek yogurt. Fruit and nut trail mix. Eat balanced meals throughout the week. This includes: Fruit: 2-3 servings a day Vegetables: 4-5 servings a day Low-fat dairy: 2 servings a day Fish, poultry, or lean meat: 1 serving a day Beans and legumes: 2 or more servings a week Nuts and seeds: 1-2 servings a day Whole grains: 6-8 servings a day Extra-virgin olive oil: 3-4 servings a day Limit red meat and sweets to only a few servings a month  What are my food choices? Mediterranean diet Recommended Grains: Whole-grain pasta. Brown rice. Bulgar wheat. Polenta. Couscous. Whole-wheat bread. [place]. Vegetables: Artichokes. Beets. Broccoli. Cabbage. Carrots. Eggplant. Green beans. Chard. Kale. Spinach. Onions. Leeks. Peas. Squash. Tomatoes. Peppers. Radishes. Fruits: Apples. Apricots. Avocado. Berries. Bananas. Cherries. Dates. Figs. Grapes. Lemons. Melon. Oranges. Peaches. Plums. Pomegranate. Meats and other protein foods: Beans. Almonds. Sunflower seeds. Pine nuts. Peanuts. [place]. Salmon. Scallops. Shrimp. [place]. Tilapia. Clams. Oysters. Eggs. Dairy: Low-fat milk. Cheese. Greek yogurt. Beverages: Water. Red wine. Herbal tea. Fats and oils: Extra virgin olive oil. Avocado oil. Grape seed oil. Sweets and desserts: [place] yogurt with honey. Baked apples. Poached pears. Trail mix. Seasoning and other foods: Basil. Cilantro. Coriander. Cumin. Mint. Parsley. Sage. Rosemary. Tarragon. Garlic. Oregano. Thyme. Pepper. Balsalmic vinegar. Tahini. Hummus. Tomato sauce. Olives.  Mushrooms. Limit these Grains: Prepackaged pasta or rice dishes. Prepackaged cereal with added sugar. Vegetables: Deep fried potatoes (french fries). Fruits: Fruit canned in syrup. Meats and other protein foods: Beef. Pork. Lamb. Poultry with skin. Hot dogs. Bernie[place] Salines. Dairy: Ice cream. Sour cream. Whole milk. Beverages: Juice. Sugar-sweetened soft drinks. Beer. Liquor and spirits. Fats and oils: Butter. Canola oil. Vegetable oil. Beef fat (tallow). Lard. Sweets and desserts: Cookies. Cakes. Pies. Candy. Seasoning and other foods: Mayonnaise. Premade sauces and marinades. The items listed may not be a complete list. Talk with your dietitian aboutwhat dietary choices are right for you. Summary The Mediterranean diet includes both food and lifestyle choices. Eat a variety of fresh fruits and vegetables, beans, nuts, seeds, and whole grains. Limit the amount of red meat and sweets that you eat. Talk with your health care provider about whether it is safe for you to drink red wine in moderation. This means 1 glass a day for nonpregnant women and 2 glasses a day for men. A glass of wine equals 5 oz (150 mL). This information is not intended to replace advice given to you by your health care provider. Make sure you discuss any questions you have with your healthcare provider. Document Revised: 04/01/2016 Document Reviewed: 03/25/2016 Elsevier Patient Education  [place].

## 2021-03-20 NOTE — Progress Notes (Signed)
Cardiology Office Note:    Date:  03/20/2021   ID:  Hannah Vasquez, DOB 02-20-1951, MRN [id number]  PCP:  Lesleigh Noe, MD   [facility] Providers Cardiologist:  Candee Furbish, MD Sleep Medicine:  Fransico Him, MD     Referring MD: Lesleigh Noe, MD   History of Present Illness:    Hannah Vasquez is a 70 y.o. female here for the follow-up of coronary artery disease with 2 prior stents placed in the LAD following STEMI on May 2018.  EF was 40% during her myocardial infarction but normalized by July 2018.  Excellent.  Has chronic back issues.  Back in June 2020 when she had a stress test to evaluate some atypical chest discomfort.  She was seen by the DOD that day.  Nuclear stress test was normal showing no ischemia.  Overall doing fairly well.  She is having some back pain, seeing neurosurgery clinic, back injections.  She is here today with her daughter.  Past Medical History:  Diagnosis Date   Allergy    Anxiety    CAD (coronary artery disease)    Cataract    Does use hearing aid    bilateral as of 10/05/2019   Elevated alkaline phosphatase level    Hemorrhoids    Hepatic steatosis    History of MI (myocardial infarction) 10/07/2017   Hypertension    Mitral valve prolapse    Osteoporosis    Status post dilation of esophageal narrowing     Past Surgical History:  Procedure Laterality Date   BASAL CELL CARCINOMA EXCISION  02/05/2016   Dr. Sarajane Jews, [facility]   BLADDER SURGERY  11/15/1995   bladder neck suspension, urinary incontinence   CARDIAC CATHETERIZATION  01/03/2017 DES LAD   CATARACT EXTRACTION Left 04/30/2016   Dr. Tommy Rainwater, [place] N/A 01/03/2017   Procedure: Coronary Stent Intervention;  Surgeon: Nelva Bush, MD;  Location: [place] [facility];  Service: Cardiovascular;  Laterality: N/A;   DENTAL SURGERY  06/20/2016   Tooth Implant; Dr. Kalman Shan   INTRAVASCULAR ULTRASOUND/IVUS N/A 01/03/2017   Procedure:  Intravascular Ultrasound/IVUS;  Surgeon: Nelva Bush, MD;  Location: [place] [facility];  Service: Cardiovascular;  Laterality: N/A;   KNEE SURGERY  2003   torn meniscus   LEFT HEART CATH AND CORONARY ANGIOGRAPHY N/A 01/03/2017   Procedure: Left Heart Cath and Coronary Angiography;  Surgeon: Nelva Bush, MD;  Location: [place] [facility];  Service: Cardiovascular;  Laterality: N/A;   MOHS SURGERY Right 2017   right side of face   ROTATOR CUFF REPAIR     SHOULDER SURGERY Right 2005    Current Medications: Current Meds  Medication Sig   albuterol (VENTOLIN HFA) 108 (90 Base) MCG/ACT inhaler Inhale 1 puff into the lungs every 6 (six) hours as needed for wheezing or shortness of breath.   amLODipine (NORVASC) 5 MG tablet TAKE 1 TABLET BY MOUTH ONCE A DAY   aspirin EC 81 MG tablet Take 81 mg by mouth every morning.    atorvastatin (LIPITOR) 80 MG tablet TAKE 1 TABLET BY MOUTH ONCE A DAY AT [facility] THE EVENING.   Calcium-Magnesium-Vitamin D (CALCIUM 1200+D3 PO) Take 1,200 mg by mouth.   Cholecalciferol (VITAMIN D3) 1000 UNITS CAPS Take 1 capsule by mouth every morning.    fluticasone (FLONASE) 50 MCG/ACT nasal spray Place 1 spray into both nostrils as needed.   furosemide (LASIX) 20 MG tablet Take 1 tablet (20 mg total)  by mouth daily as needed.   gabapentin (NEURONTIN) 300 MG capsule TAKE 1 CAPSULE BY MOUTH 3 TIMES A DAY   hydrochlorothiazide (HYDRODIURIL) 25 MG tablet Take 1 tablet (25 mg total) by mouth daily.   isosorbide mononitrate (IMDUR) 30 MG 24 hr tablet Take 1 tablet (30 mg total) by mouth daily.   loratadine (CLARITIN) 10 MG tablet Take 1 tablet (10 mg total) by mouth every morning.   losartan (COZAAR) 100 MG tablet TAKE 1 TABLET BY MOUTH ONCE A DAY   metoprolol succinate (TOPROL-XL) 25 MG 24 hr tablet Take 1 tablet (25 mg total) by mouth daily.   Multiple Vitamin (MULTIVITAMIN) tablet Take 1 tablet by mouth every morning.    nitroGLYCERIN (NITROSTAT) 0.4 MG SL tablet Place  1 tablet (0.4 mg total) under the tongue every 5 (five) minutes as needed for chest pain.   Omega-3 Fatty Acids (FISH OIL) 1200 MG CAPS Take 1 capsule by mouth every morning.    omeprazole (PRILOSEC) 40 MG capsule TAKE 1 CAPSULE BY MOUTH TWICE DAILY   potassium chloride SA (KLOR-CON) 20 MEQ tablet TAKE 2 TABLETS BY MOUTH TWICE DAILY   traMADol (ULTRAM) 50 MG tablet Take 1 tablet (50 mg total) by mouth every 6 (six) hours as needed for severe pain.   Turmeric Curcumin 500 MG CAPS Take 1 capsule by mouth daily.   vitamin B-12 (CYANOCOBALAMIN) 100 MCG tablet Take 1 tablet (100 mcg total) by mouth daily.     Allergies:   Promethazine-dm, Bactroban [mupirocin calcium], Codeine, Penicillins, and Valtrex [valacyclovir hcl]   Social History   Socioeconomic History   Marital status: Divorced    Spouse name: Not on file   Number of children: 2   Years of education: high school   Highest education level: Not on file  Occupational History   Occupation: Medical office---front desk    Comment: Retired  Tobacco Use   Smoking status: Never   Smokeless tobacco: Never  Vaping Use   Vaping Use: Never used  Substance and Sexual Activity   Alcohol use: No    Alcohol/week: 0.0 standard drinks   Drug use: No   Sexual activity: Not Currently    Birth control/protection: Post-menopausal  Other Topics Concern   Not on file  Social History Narrative   10/09/19   From: the area   Living: alone   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      Family: Product manager and Probation officer (nearby) - keeps in good touch with them, 3 grandchildren      Enjoys: walking, outdoor activities, family time      Exercise: mostly just walking around the house, in cardio rehab at [facility]   Diet: not great currently      Safety   Seat belts: Yes    Guns: No   Safe in relationships: Yes          Social Determinants of Radio broadcast assistant Strain: Low Risk    Difficulty of Paying Living Expenses: Not  hard at all  Food Insecurity: No Food Insecurity   Worried About Charity fundraiser in the Last Year: Never true   [place] in the Last Year: Never true  Transportation Needs: No Transportation Needs   Lack of Transportation (Medical): No   Lack of Transportation (Non-Medical): No  Physical Activity: Insufficiently Active   Days of Exercise per Week: 2 days   Minutes of Exercise per Session: 50 min  Stress: No Stress Concern Present   Feeling of Stress : Not at all  Social Connections: Not on file     Family History: The patient's family history includes Brain cancer in her father; Hypertension in her father and mother; Mitral valve prolapse in her mother. There is no history of Colon cancer, Stomach cancer, Esophageal cancer, or Pancreatic cancer.  ROS:   Please see the history of present illness.     All other systems reviewed and are negative.  EKGs/Labs/Other Studies Reviewed:   EKG:  EKG is  ordered today.  The ekg ordered today demonstrates SR 62   Recent Labs: 01/29/2021: ALT 36; BUN 11; Creatinine, Ser 0.81; Hemoglobin 12.7; Platelets 305.0; Potassium 3.7; Sodium 140; TSH 3.84  Recent Lipid Panel    Component Value Date/Time   CHOL 121 11/18/2020 1208   CHOL 115 11/09/2019 0844   TRIG 77.0 11/18/2020 1208   HDL 31.70 (L) 11/18/2020 1208   HDL 30 (L) 11/09/2019 0844   CHOLHDL 4 11/18/2020 1208   VLDL 15.4 11/18/2020 1208   LDLCALC 74 11/18/2020 1208   LDLCALC 63 11/09/2019 0844   LDLDIRECT 151.0 09/09/2011 0919     Risk Assessment/Calculations:          Physical Exam:    VS:  BP 128/78   Pulse 62   Ht 5' 2"  (1.575 m)   Wt 158 lb 12.8 oz (72 kg)   LMP  (LMP Unknown)   SpO2 95%   BMI 29.04 kg/m     Wt Readings from Last 3 Encounters:  03/20/21 158 lb 12.8 oz (72 kg)  03/06/21 155 lb (70.3 kg)  01/29/21 157 lb 12 oz (71.6 kg)     GEN:  Well nourished, well developed in no acute distress HEENT: Normal NECK: No JVD; No carotid  bruits LYMPHATICS: No lymphadenopathy CARDIAC: RRR, no murmurs, rubs, gallops RESPIRATORY:  Clear to auscultation without rales, wheezing or rhonchi  ABDOMEN: Soft, non-tender, non-distended MUSCULOSKELETAL:  No edema; No deformity  SKIN: Warm and dry NEUROLOGIC:  Alert and oriented x 3 PSYCHIATRIC:  Normal affect   ASSESSMENT:    1. Coronary artery disease involving native coronary artery of native heart without angina pectoris   2. Stable angina ([place])   3. OSA (obstructive sleep apnea)   4. HYPERTENSION, BENIGN   5. Pure hypercholesterolemia    PLAN:    In order of problems listed above:  Coronary artery disease status post STEMI - Anterior STEMI 12/2016 with EF 40% at the time which eventually returned to normal 55% - She had PCI x2 to the LAD, small diagonal branch was jailed. - She had had some episodes of atypical chest discomfort and a pharmacologic stress test was performed which was normal. - Continue with goal-directed medical therapy which includes aspirin, beta-blocker, high intensity statin LDL 74 hemoglobin A1c 6.5 hemoglobin 12.7 creatinine 0.8 ALT 36.  Hyperlipidemia - Continue with atorvastatin 80 mg a day, last LDL 74 -Asked about nutrition.  Gave her some information.  Essential hypertension - Stable, continue with current medical management -Amlodipine 5 mg once a day losartan 100 mg once a day hydrochlorothiazide 25 mg a day metoprolol 25 mg a day isosorbide 30 mg a day furosemide 20 mg daily  Mitral valve prolapse - Anterior leaflet trivial prolapse.  Overall excellent.  Fatigue/daytime somnolence/ OSA - Dr. Radford Pax has been following. CPAP, nasal pillows.  Getting used to this.  Shortness of breath - Dr. Lamonte Sakai has seen in the past with  pulmonary.  Prior testing unremarkable.  She is on a Breo inhaler       Medication Adjustments/Labs and Tests Ordered: Current medicines are reviewed at length with the patient today.  Concerns regarding medicines  are outlined above.  Orders Placed This Encounter  Procedures   AMB referral to cardiac rehabilitation   EKG 12-Lead    No orders of the defined types were placed in this encounter.   Patient Instructions  Medication Instructions:  The current medical regimen is effective;  continue present plan and medications.  *If you need a refill on your cardiac medications before your next appointment, please call your pharmacy*  Follow-Up: At [facility], you and your health needs are our priority.  As part of our continuing mission to provide you with exceptional heart care, we have created designated Provider Care Teams.  These Care Teams include your primary Cardiologist (physician) and Advanced Practice Providers (APPs -  Physician Assistants and Nurse Practitioners) who all work together to provide you with the care you need, when you need it.  We recommend signing up for the patient portal called "MyChart".  Sign up information is provided on this After Visit Summary.  MyChart is used to connect with patients for Virtual Visits (Telemedicine).  Patients are able to view lab/test results, encounter notes, upcoming appointments, etc.  Non-urgent messages can be sent to your provider as well.   To learn more about what you can do with MyChart, go to [email or web address].    Your next appointment:   1 year(s)  The format for your next appointment:   In Person  Provider:   Candee Furbish, MD   Thank you for choosing [facility]!!   Mediterranean Diet A Mediterranean diet refers to food and lifestyle choices that are based on the traditions of countries located on the [employer]. This way of eating has been shown to help prevent certain conditions and improve outcomes forpeople who have chronic diseases, like kidney disease and heart disease. What are tips for following this plan? Lifestyle Cook and eat meals together with your family, when possible. Drink enough  fluid to keep your urine clear or pale yellow. Be physically active every day. This includes: Aerobic exercise like running or swimming. Leisure activities like gardening, walking, or housework. Get 7-8 hours of sleep each night. If recommended by your health care provider, drink red wine in moderation. This means 1 glass a day for nonpregnant women and 2 glasses a day for men. A glass of wine equals 5 oz (150 mL). Reading food labels  Check the serving size of packaged foods. For foods such as rice and pasta, the serving size refers to the amount of cooked product, not dry. Check the total fat in packaged foods. Avoid foods that have saturated fat or trans fats. Check the ingredients list for added sugars, such as corn syrup.  Shopping At the grocery store, buy most of your food from the areas near the walls of the store. This includes: Fresh fruits and vegetables (produce). Grains, beans, nuts, and seeds. Some of these may be available in unpackaged forms or large amounts (in bulk). Fresh seafood. Poultry and eggs. Low-fat dairy products. Buy whole ingredients instead of prepackaged foods. Buy fresh fruits and vegetables in-season from local farmers markets. Buy frozen fruits and vegetables in resealable bags. If you do not have access to quality fresh seafood, buy precooked frozen shrimp or canned fish, such as tuna, salmon, or sardines. Buy small amounts  of raw or cooked vegetables, salads, or olives from the deli or salad bar at your store. Stock your pantry so you always have certain foods on hand, such as olive oil, canned tuna, canned tomatoes, rice, pasta, and beans. Cooking Cook foods with extra-virgin olive oil instead of using butter or other vegetable oils. Have meat as a side dish, and have vegetables or grains as your main dish. This means having meat in small portions or adding small amounts of meat to foods like pasta or stew. Use beans or vegetables instead of meat in  common dishes like chili or lasagna. Experiment with different cooking methods. Try roasting or broiling vegetables instead of steaming or sauteing them. Add frozen vegetables to soups, stews, pasta, or rice. Add nuts or seeds for added healthy fat at each meal. You can add these to yogurt, salads, or vegetable dishes. Marinate fish or vegetables using olive oil, lemon juice, garlic, and fresh herbs. Meal planning  Plan to eat 1 vegetarian meal one day each week. Try to work up to 2 vegetarian meals, if possible. Eat seafood 2 or more times a week. Have healthy snacks readily available, such as: Vegetable sticks with hummus. Greek yogurt. Fruit and nut trail mix. Eat balanced meals throughout the week. This includes: Fruit: 2-3 servings a day Vegetables: 4-5 servings a day Low-fat dairy: 2 servings a day Fish, poultry, or lean meat: 1 serving a day Beans and legumes: 2 or more servings a week Nuts and seeds: 1-2 servings a day Whole grains: 6-8 servings a day Extra-virgin olive oil: 3-4 servings a day Limit red meat and sweets to only a few servings a month  What are my food choices? Mediterranean diet Recommended Grains: Whole-grain pasta. Brown rice. Bulgar wheat. Polenta. Couscous. Whole-wheat bread. [place]. Vegetables: Artichokes. Beets. Broccoli. Cabbage. Carrots. Eggplant. Green beans. Chard. Kale. Spinach. Onions. Leeks. Peas. Squash. Tomatoes. Peppers. Radishes. Fruits: Apples. Apricots. Avocado. Berries. Bananas. Cherries. Dates. Figs. Grapes. Lemons. Melon. Oranges. Peaches. Plums. Pomegranate. Meats and other protein foods: Beans. Almonds. Sunflower seeds. Pine nuts. Peanuts. [place]. Salmon. Scallops. Shrimp. [place]. Tilapia. Clams. Oysters. Eggs. Dairy: Low-fat milk. Cheese. Greek yogurt. Beverages: Water. Red wine. Herbal tea. Fats and oils: Extra virgin olive oil. Avocado oil. Grape seed oil. Sweets and desserts: [place] yogurt with honey. Baked apples. Poached  pears. Trail mix. Seasoning and other foods: Basil. Cilantro. Coriander. Cumin. Mint. Parsley. Sage. Rosemary. Tarragon. Garlic. Oregano. Thyme. Pepper. Balsalmic vinegar. Tahini. Hummus. Tomato sauce. Olives. Mushrooms. Limit these Grains: Prepackaged pasta or rice dishes. Prepackaged cereal with added sugar. Vegetables: Deep fried potatoes (french fries). Fruits: Fruit canned in syrup. Meats and other protein foods: Beef. Pork. Lamb. Poultry with skin. Hot dogs. [place] Salines. Dairy: Ice cream. Sour cream. Whole milk. Beverages: Juice. Sugar-sweetened soft drinks. Beer. Liquor and spirits. Fats and oils: Butter. Canola oil. Vegetable oil. Beef fat (tallow). Lard. Sweets and desserts: Cookies. Cakes. Pies. Candy. Seasoning and other foods: Mayonnaise. Premade sauces and marinades. The items listed may not be a complete list. Talk with your dietitian aboutwhat dietary choices are right for you. Summary The Mediterranean diet includes both food and lifestyle choices. Eat a variety of fresh fruits and vegetables, beans, nuts, seeds, and whole grains. Limit the amount of red meat and sweets that you eat. Talk with your health care provider about whether it is safe for you to drink red wine in moderation. This means 1 glass a day for nonpregnant women and 2 glasses a day for men. A  glass of wine equals 5 oz (150 mL). This information is not intended to replace advice given to you by your health care provider. Make sure you discuss any questions you have with your healthcare provider. Document Revised: 04/01/2016 Document Reviewed: 03/25/2016 Elsevier Patient Education  [employer].   Signed, Candee Furbish, MD  03/20/2021 9:42 AM    [place]

## 2021-03-30 ENCOUNTER — Other Ambulatory Visit: Payer: Self-pay | Admitting: *Deleted

## 2021-03-30 DIAGNOSIS — Z955 Presence of coronary angioplasty implant and graft: Secondary | ICD-10-CM

## 2021-03-30 NOTE — Progress Notes (Signed)
Jerline Pain, MD  Rowe Pavy, RN; Shellia Cleverly, RN Let's do Maintenance again  Will need a referral for the cardiac rehab maintenance program with the diagnosis for coronary stents  Thanks  Candee Furbish, MD   Order placed per Dr Marlou Porch

## 2021-04-02 DIAGNOSIS — G4733 Obstructive sleep apnea (adult) (pediatric): Secondary | ICD-10-CM | POA: Diagnosis not present

## 2021-04-07 ENCOUNTER — Encounter (HOSPITAL_COMMUNITY)
Admission: RE | Admit: 2021-04-07 | Discharge: 2021-04-07 | Disposition: A | Discharge status: Home / Self Care | Payer: PPO | Source: Ambulatory Visit | Attending: Cardiology | Admitting: Cardiology

## 2021-04-07 ENCOUNTER — Other Ambulatory Visit: Payer: Self-pay

## 2021-04-07 DIAGNOSIS — Z955 Presence of coronary angioplasty implant and graft: Secondary | ICD-10-CM | POA: Insufficient documentation

## 2021-04-07 NOTE — Telephone Encounter (Signed)
Order placed to [facility] via community message 

## 2021-04-09 ENCOUNTER — Other Ambulatory Visit: Payer: Self-pay

## 2021-04-09 ENCOUNTER — Encounter (HOSPITAL_COMMUNITY)
Admission: RE | Admit: 2021-04-09 | Discharge: 2021-04-09 | Disposition: A | Discharge status: Home / Self Care | Payer: PPO | Source: Ambulatory Visit | Attending: Cardiology | Admitting: Cardiology

## 2021-04-10 DIAGNOSIS — G4733 Obstructive sleep apnea (adult) (pediatric): Secondary | ICD-10-CM | POA: Diagnosis not present

## 2021-04-14 ENCOUNTER — Other Ambulatory Visit: Payer: Self-pay

## 2021-04-14 ENCOUNTER — Encounter (HOSPITAL_COMMUNITY)
Admission: RE | Admit: 2021-04-14 | Discharge: 2021-04-14 | Disposition: A | Discharge status: Home / Self Care | Payer: PPO | Source: Ambulatory Visit | Attending: Cardiology | Admitting: Cardiology

## 2021-04-16 ENCOUNTER — Encounter (HOSPITAL_COMMUNITY): Payer: PPO

## 2021-04-16 DIAGNOSIS — Z955 Presence of coronary angioplasty implant and graft: Secondary | ICD-10-CM | POA: Diagnosis not present

## 2021-04-21 ENCOUNTER — Encounter (HOSPITAL_COMMUNITY): Payer: PPO

## 2021-04-23 ENCOUNTER — Encounter (HOSPITAL_COMMUNITY): Payer: PPO

## 2021-04-28 ENCOUNTER — Encounter (HOSPITAL_COMMUNITY)
Admission: RE | Admit: 2021-04-28 | Discharge: 2021-04-28 | Disposition: A | Discharge status: Home / Self Care | Payer: PPO | Source: Ambulatory Visit | Attending: Cardiology | Admitting: Cardiology

## 2021-04-28 ENCOUNTER — Other Ambulatory Visit: Payer: Self-pay

## 2021-04-30 ENCOUNTER — Telehealth (HOSPITAL_COMMUNITY): Payer: Self-pay | Admitting: Family Medicine

## 2021-04-30 ENCOUNTER — Encounter (HOSPITAL_COMMUNITY): Payer: PPO

## 2021-05-03 DIAGNOSIS — G4733 Obstructive sleep apnea (adult) (pediatric): Secondary | ICD-10-CM | POA: Diagnosis not present

## 2021-05-05 ENCOUNTER — Telehealth (HOSPITAL_COMMUNITY): Payer: Self-pay | Admitting: Family Medicine

## 2021-05-05 ENCOUNTER — Encounter (HOSPITAL_COMMUNITY): Payer: PPO

## 2021-05-05 DIAGNOSIS — M5126 Other intervertebral disc displacement, lumbar region: Secondary | ICD-10-CM | POA: Diagnosis not present

## 2021-05-05 DIAGNOSIS — M5416 Radiculopathy, lumbar region: Secondary | ICD-10-CM | POA: Diagnosis not present

## 2021-05-06 ENCOUNTER — Ambulatory Visit: Payer: PPO | Admitting: Internal Medicine

## 2021-05-06 ENCOUNTER — Encounter: Payer: Self-pay | Admitting: Internal Medicine

## 2021-05-06 ENCOUNTER — Other Ambulatory Visit (INDEPENDENT_AMBULATORY_CARE_PROVIDER_SITE_OTHER): Payer: PPO

## 2021-05-06 VITALS — BP 126/66 | HR 78 | Ht 62.0 in | Wt 159.6 lb

## 2021-05-06 DIAGNOSIS — K760 Fatty (change of) liver, not elsewhere classified: Secondary | ICD-10-CM

## 2021-05-06 DIAGNOSIS — R748 Abnormal levels of other serum enzymes: Secondary | ICD-10-CM

## 2021-05-06 DIAGNOSIS — R1013 Epigastric pain: Secondary | ICD-10-CM

## 2021-05-06 LAB — HEPATIC FUNCTION PANEL
ALT: 33 U/L (ref 0–35)
AST: 57 U/L — ABNORMAL HIGH (ref 0–37)
Albumin: 4.1 g/dL (ref 3.5–5.2)
Alkaline Phosphatase: 126 U/L — ABNORMAL HIGH (ref 39–117)
Bilirubin, Direct: 0.2 mg/dL (ref 0.0–0.3)
Total Bilirubin: 0.8 mg/dL (ref 0.2–1.2)
Total Protein: 7.4 g/dL (ref 6.0–8.3)

## 2021-05-06 LAB — CBC WITH DIFFERENTIAL/PLATELET
Basophils Absolute: 0 10*3/uL (ref 0.0–0.1)
Basophils Relative: 0.5 % (ref 0.0–3.0)
Eosinophils Absolute: 0.3 10*3/uL (ref 0.0–0.7)
Eosinophils Relative: 3 % (ref 0.0–5.0)
HCT: 36.6 % (ref 36.0–46.0)
Hemoglobin: 12.2 g/dL (ref 12.0–15.0)
Lymphocytes Relative: 32.3 % (ref 12.0–46.0)
Lymphs Abs: 3 10*3/uL (ref 0.7–4.0)
MCHC: 33.2 g/dL (ref 30.0–36.0)
MCV: 81.7 fl (ref 78.0–100.0)
Monocytes Absolute: 0.6 10*3/uL (ref 0.1–1.0)
Monocytes Relative: 6.8 % (ref 3.0–12.0)
Neutro Abs: 5.4 10*3/uL (ref 1.4–7.7)
Neutrophils Relative %: 57.4 % (ref 43.0–77.0)
Platelets: 296 10*3/uL (ref 150.0–400.0)
RBC: 4.48 Mil/uL (ref 3.87–5.11)
RDW: 14.8 % (ref 11.5–15.5)
WBC: 9.3 10*3/uL (ref 4.0–10.5)

## 2021-05-06 LAB — PROTIME-INR
INR: 1.1 ratio — ABNORMAL HIGH (ref 0.8–1.0)
Prothrombin Time: 11.7 s (ref 9.6–13.1)

## 2021-05-06 LAB — IBC + FERRITIN
Ferritin: 119.3 ng/mL (ref 10.0–291.0)
Iron: 65 ug/dL (ref 42–145)
Saturation Ratios: 21.2 % (ref 20.0–50.0)
TIBC: 306.6 ug/dL (ref 250.0–450.0)
Transferrin: 219 mg/dL (ref 212.0–360.0)

## 2021-05-06 NOTE — Patient Instructions (Addendum)
Your provider has requested that you go to the basement level for lab work before leaving today. Press "B" on the elevator. The lab is located at the first door on the left as you exit the elevator.   You have been scheduled for a Liver Biopsy at [facility] on  05/15/2021 at 1:00pm Arrive at 11:00AM at [facility] Stay Nothing to eat or drink after 7 am, No Omega 3, 5 days before, No Asprin 3 days before . You will need a driver and only one person can come in the hospital with you   Due to recent changes in healthcare laws, you may see the results of your imaging and laboratory studies on MyChart before your provider has had a chance to review them.  We understand that in some cases there may be results that are confusing or concerning to you. Not all laboratory results come back in the same time frame and the provider may be waiting for multiple results in order to interpret others.  Please give [place] 48 hours in order for your provider to thoroughly review all the results before contacting the office for clarification of your results.    Follow up in 3 months  If you are age 72 or older, your body mass index should be between 23-30. Your Body mass index is 29.19 kg/m. If this is out of the aforementioned range listed, please consider follow up with your Primary Care Provider.  If you are age 46 or younger, your body mass index should be between 19-25. Your Body mass index is 29.19 kg/m. If this is out of the aformentioned range listed, please consider follow up with your Primary Care Provider.   __________________________________________________________  The [facility] providers would like to encourage you to use [facility] to communicate with providers for non-urgent requests or questions.  Due to long hold times on the telephone, sending your provider a message by [facility] may be a faster and more efficient way to get a response.  Please allow 48 business hours for a response.  Please  remember that this is for non-urgent requests.

## 2021-05-06 NOTE — Progress Notes (Signed)
Subjective:    Patient ID: Hannah Vasquez, female    DOB: March 28, 1951, 70 y.o.   MRN: [id number]  HPI Hannah Vasquez is a 70 year old female with a history of GERD, elevated liver enzymes, fatty liver, epigastric abdominal pain with early satiety and nausea responsive to PPI, hypertension, osteoporosis and CAD with history of MI who is here for follow-up.  She was last seen here a year ago and is here alone today.  She reports that on the whole she has felt better than she did previously from an abdominal discomfort and pain standpoint.  Last year her PPI was increased to twice daily and her epigastric pain has improved.  She is able to eat full meals but avoid certain trigger foods like lettuce.  She has improvement in nausea and has not had vomiting.  Bowel movements are usually regular with no blood or melena.  She does report feeling "exhausted all the time".  She reports that her sleep is not always complete and she has a hard time falling asleep.  Since April of this year she has been trying to use nasal CPAP.  She at least starts every night with wearing her CPAP but does not always finish the night wearing her CPAP.  She has upcoming steroid injections for her lumbar spine.  She is also followed up with Dr. Marlou Porch with cardiology  She has been a lifelong nonalcohol drinker   Review of Systems As per HPI, otherwise negative  Current Medications, Allergies, Past Medical History, Past Surgical History, Family History and Social History were reviewed in [facility] record.    Objective:   Physical Exam BP 126/66   Pulse 78   Ht 5' 2"  (1.575 m)   Wt 159 lb 9.6 oz (72.4 kg)   LMP  (LMP Unknown)   SpO2 98%   BMI 29.19 kg/m  Gen: awake, alert, NAD HEENT: anicteric  CV: RRR, no mrg Pulm: CTA b/l Abd: soft, NT/ND, +BS throughout Ext: no c/c/e Neuro: nonfocal  Lab Results  Component Value Date   INR 1.0 09/25/2019   INR 1.12 01/02/2017   INR 1.13 01/01/2017    CMP     Component Value Date/Time   NA 140 01/29/2021 0922   NA 141 11/09/2019 0844   K 3.7 01/29/2021 0922   CL 102 01/29/2021 0922   CO2 29 01/29/2021 0922   GLUCOSE 121 (H) 01/29/2021 0922   BUN 11 01/29/2021 0922   BUN 11 11/09/2019 0844   CREATININE 0.81 01/29/2021 0922   CALCIUM 9.7 01/29/2021 0922   PROT 7.0 01/29/2021 0922   PROT 6.7 07/02/2019 1104   ALBUMIN 4.2 01/29/2021 0922   ALBUMIN 4.2 07/02/2019 1104   AST 53 (H) 01/29/2021 0922   ALT 36 (H) 01/29/2021 0922   ALKPHOS 140 (H) 01/29/2021 0922   BILITOT 0.7 01/29/2021 0922   BILITOT 0.8 07/02/2019 1104   GFRNONAA 58 (L) 11/09/2019 0844   GFRAA 67 11/09/2019 0844   MRI ABDOMEN WITHOUT AND WITH CONTRAST (INCLUDING MRCP)   TECHNIQUE: Multiplanar multisequence MR imaging of the abdomen was performed both before and after the administration of intravenous contrast. Heavily T2-weighted images of the biliary and pancreatic ducts were obtained, and three-dimensional MRCP images were rendered by post processing.   CONTRAST:  65m MULTIHANCE GADOBENATE DIMEGLUMINE 529 MG/ML IV SOLN   COMPARISON:  Hepatobiliary ultrasound from 07/27/2019   FINDINGS: Despite efforts by the technologist and patient, motion artifact is present on today's exam and  could not be eliminated. This reduces exam sensitivity and specificity.   Lower chest: Small type 1 hiatal hernia.   Hepatobiliary: Striking diffuse hepatic steatosis. Gallbladder unremarkable. No obvious irregularity or filling defect in the extrahepatic biliary tree. No appreciable abnormal hepatic parenchymal enhancement.   Pancreas:  Unremarkable   Spleen:  Unremarkable   Adrenals/Urinary Tract:  Unremarkable   Stomach/Bowel: Small type 1 hiatal hernia.   Vascular/Lymphatic:  Abdominal aortic atherosclerosis.   Other:  No supplemental non-categorized findings.   Musculoskeletal: L1 hemangioma. Lower thoracic and lumbar spondylosis and degenerative disc  disease.   IMPRESSION: 1. Striking diffuse hepatic steatosis. 2. Small type 1 hiatal hernia. 3. Lower thoracic and lumbar spondylosis and degenerative disc disease. 4.  Aortic Atherosclerosis (ICD10-I70.0).     Electronically Signed   By: Van Clines M.D.   On: 04/14/2020 08:26         Assessment & Plan:  70 year old female with a history of GERD, elevated liver enzymes, fatty liver, epigastric abdominal pain with early satiety and nausea responsive to PPI, hypertension, osteoporosis and CAD with history of MI who is here for follow-up.   Fatty liver/elevated liver enzymes --she has diffuse and dense hepatic steatosis seen by MRI without biliary ductal dilatation.  Her liver enzymes have certainly fluctuated and her alkaline phosphatase has been persistently elevated.  Her AST and ALT were modestly elevated in June of this year.  I am highly suspicious for NASH.  We discussed this at length today.  I would like to confirm the diagnosis and I recommended liver biopsy.  Her autoimmune and viral hepatitis evaluations previously have been negative --Repeat liver enzymes including hepatic function panel, INR, CBC, also check iron studies and 5 nucleotidase --Liver biopsy with radiology --evaluate for NASH and exclude fibrosis --We discussed the importance of exercise and weight loss.  We discussed how 5 to 10% body weight loss can significantly improve biochemical parameters and NASH.  She will work on this in the coming 6 to 12 months --I also recommended she begin Vitamin E 800 IU daily  2.  Epigastric pain/nausea/early satiety --this symptom has significantly improved with increased PPI.  Given improvement we will continue omeprazole 40 mg twice daily for now.  We have discussed EGD over time and she remains skeptical of this procedure given the fact that she had a MI in close proximity to previously performed endoscopic procedures --Continue omeprazole 40 mg twice daily  3 to  74-monthfollow-up  30 minutes total spent today including patient facing time, coordination of care, reviewing medical history/procedures/pertinent radiology studies, and documentation of the encounter.

## 2021-05-07 ENCOUNTER — Encounter (HOSPITAL_COMMUNITY): Payer: PPO

## 2021-05-09 ENCOUNTER — Other Ambulatory Visit: Payer: Self-pay | Admitting: Gastroenterology

## 2021-05-10 LAB — NUCLEOTIDASE, 5', BLOOD: 5-Nucleotidase: 4 U/L (ref 0–10)

## 2021-05-11 DIAGNOSIS — M5126 Other intervertebral disc displacement, lumbar region: Secondary | ICD-10-CM | POA: Diagnosis not present

## 2021-05-11 DIAGNOSIS — M5416 Radiculopathy, lumbar region: Secondary | ICD-10-CM | POA: Diagnosis not present

## 2021-05-12 ENCOUNTER — Encounter (HOSPITAL_COMMUNITY): Payer: PPO

## 2021-05-14 ENCOUNTER — Encounter (HOSPITAL_COMMUNITY): Payer: PPO

## 2021-05-15 ENCOUNTER — Ambulatory Visit (HOSPITAL_COMMUNITY): Payer: PPO

## 2021-05-19 ENCOUNTER — Encounter (HOSPITAL_COMMUNITY): Payer: PPO

## 2021-05-19 DIAGNOSIS — Z48812 Encounter for surgical aftercare following surgery on the circulatory system: Secondary | ICD-10-CM | POA: Insufficient documentation

## 2021-05-19 DIAGNOSIS — Z955 Presence of coronary angioplasty implant and graft: Secondary | ICD-10-CM | POA: Diagnosis not present

## 2021-05-20 ENCOUNTER — Telehealth (HOSPITAL_COMMUNITY): Payer: Self-pay | Admitting: *Deleted

## 2021-05-20 NOTE — Telephone Encounter (Signed)
Patient has been out due to back pain. Recently had injection to help with back pain. Patient will be out 05/21/21 for liver biopsy and out week of 05/25/21. Plans to return on Tuesday 06/02/21.

## 2021-05-21 ENCOUNTER — Other Ambulatory Visit: Payer: Self-pay

## 2021-05-21 ENCOUNTER — Ambulatory Visit (INDEPENDENT_AMBULATORY_CARE_PROVIDER_SITE_OTHER): Payer: PPO | Admitting: Family Medicine

## 2021-05-21 ENCOUNTER — Encounter: Payer: Self-pay | Admitting: Family Medicine

## 2021-05-21 ENCOUNTER — Encounter (HOSPITAL_COMMUNITY): Payer: PPO

## 2021-05-21 VITALS — BP 104/68 | HR 78 | Temp 97.5°F | Ht 62.0 in | Wt 158.0 lb

## 2021-05-21 DIAGNOSIS — Z23 Encounter for immunization: Secondary | ICD-10-CM | POA: Diagnosis not present

## 2021-05-21 DIAGNOSIS — R3915 Urgency of urination: Secondary | ICD-10-CM | POA: Diagnosis not present

## 2021-05-21 DIAGNOSIS — M5135 Other intervertebral disc degeneration, thoracolumbar region: Secondary | ICD-10-CM | POA: Diagnosis not present

## 2021-05-21 DIAGNOSIS — G629 Polyneuropathy, unspecified: Secondary | ICD-10-CM | POA: Diagnosis not present

## 2021-05-21 DIAGNOSIS — G4733 Obstructive sleep apnea (adult) (pediatric): Secondary | ICD-10-CM

## 2021-05-21 DIAGNOSIS — M5416 Radiculopathy, lumbar region: Secondary | ICD-10-CM | POA: Diagnosis not present

## 2021-05-21 MED ORDER — GABAPENTIN 300 MG PO CAPS: 300.0000 mg | ORAL_CAPSULE | Freq: Three times a day (TID) | ORAL | 3 refills | Status: AC

## 2021-05-21 MED ORDER — TRAMADOL HCL 50 MG PO TABS: 50.0000 mg | ORAL_TABLET | Freq: Four times a day (QID) | ORAL | 5 refills | Status: DC | PRN

## 2021-05-21 NOTE — Assessment & Plan Note (Signed)
W/ some incontinence. Hx of bladder tact. No improvement with home Kegels. Discussed urology referral to consider medication vs surgery vs PT.

## 2021-05-21 NOTE — Assessment & Plan Note (Signed)
Stable on gabapentin. Refill provided 300 mg tid

## 2021-05-21 NOTE — Assessment & Plan Note (Signed)
Follows with ortho. Stable on tramadol.   Indication for chronic opioid: chronic back pain Medication and dose: tramadol 50 mg # pills per month: 90 Last UDS date: none, will get at next OV Opioid Treatment Agreement signed (Y/N): no Opioid Treatment Agreement last reviewed with patient:   -- pt needs contract [place] reviewed this encounter (include red flags): Yes

## 2021-05-21 NOTE — Patient Instructions (Signed)
#  Referral I have placed a referral to a specialist for you. You should receive a phone call from the specialty office. Make sure your voicemail is not full and that if you are able to answer your phone to unknown or new numbers.   It may take up to 2 weeks to hear about the referral. If you do not hear anything in 2 weeks, please call our office and ask to speak with the referral coordinator.

## 2021-05-21 NOTE — Progress Notes (Signed)
Subjective:     Hannah Vasquez is a 70 y.o. female presenting for 6 Month Follow-up (Has seen Dr Hilarie Fredrickson recently. Back pain is better while taking tramadol. Gabapentin helps with leg/feet tingling.)     HPI  #chronic back pain - doing well with tramadol - continues to get some injections with Dr. Sula Soda - also taking gabapentin  #Liver issues - seeing Dr. Hilarie Fredrickson - getting liver biopsy done  #urinary urgency - working on kegels - still having issues rushing to the bathroom  - was on medication 20 years ago - had a hysterectomy, meds didn't help, also had a bladder tact   #OSA - still adjusting to her mask for CPAP - overall using it - but sometimes she does not use  Review of Systems   Social History   Tobacco Use  Smoking Status Never  Smokeless Tobacco Never        Objective:    BP Readings from Last 3 Encounters:  05/21/21 104/68  05/06/21 126/66  03/20/21 128/78   Wt Readings from Last 3 Encounters:  05/21/21 158 lb (71.7 kg)  05/06/21 159 lb 9.6 oz (72.4 kg)  03/20/21 158 lb 12.8 oz (72 kg)    BP 104/68 (BP Location: Left Arm, Patient Position: Sitting, Cuff Size: Large)   Pulse 78   Temp (!) 97.5 F (36.4 C)   Ht 5' 2"  (1.575 m)   Wt 158 lb (71.7 kg)   LMP  (LMP Unknown)   SpO2 97%   BMI 28.90 kg/m    Physical Exam Constitutional:      General: She is not in acute distress.    Appearance: She is well-developed. She is not diaphoretic.  HENT:     Right Ear: External ear normal.     Left Ear: External ear normal.  Eyes:     Conjunctiva/sclera: Conjunctivae normal.  Cardiovascular:     Rate and Rhythm: Normal rate and regular rhythm.  Pulmonary:     Effort: Pulmonary effort is normal. No respiratory distress.     Breath sounds: Normal breath sounds. No wheezing.  Musculoskeletal:     Cervical back: Neck supple.  Skin:    General: Skin is warm and dry.     Capillary Refill: Capillary refill takes less than 2 seconds.   Neurological:     Mental Status: She is alert. Mental status is at baseline.  Psychiatric:        Mood and Affect: Mood normal.        Behavior: Behavior normal.          Assessment & Plan:   Problem List Items Addressed This Visit       Respiratory   OSA (obstructive sleep apnea)     Nervous and Auditory   Lumbar radiculopathy   Relevant Medications   traMADol (ULTRAM) 50 MG tablet   gabapentin (NEURONTIN) 300 MG capsule   Neuropathy    Stable on gabapentin. Refill provided 300 mg tid      Relevant Medications   gabapentin (NEURONTIN) 300 MG capsule     Musculoskeletal and Integument   DDD (degenerative disc disease), thoracolumbar    Follows with ortho. Stable on tramadol.   Indication for chronic opioid: chronic back pain Medication and dose: tramadol 50 mg # pills per month: 90 Last UDS date: none, will get at next OV Opioid Treatment Agreement signed (Y/N): no Opioid Treatment Agreement last reviewed with patient:   -- pt needs contract [place] reviewed this  encounter (include red flags): Yes       Relevant Medications   traMADol (ULTRAM) 50 MG tablet     Other   Urinary urgency - Primary    W/ some incontinence. Hx of bladder tact. No improvement with home Kegels. Discussed urology referral to consider medication vs surgery vs PT.       Relevant Orders   Ambulatory referral to Urology   Other Visit Diagnoses     Need for influenza vaccination       Relevant Orders   Flu Vaccine QUAD High Dose(Fluad) (Completed)        Return in about 6 months (around 11/19/2021) for annual exam.  Lesleigh Noe, MD  This visit occurred during the SARS-CoV-2 public health emergency.  Safety protocols were in place, including screening questions prior to the visit, additional usage of staff PPE, and extensive cleaning of exam room while observing appropriate contact time as indicated for disinfecting solutions.

## 2021-05-21 NOTE — Assessment & Plan Note (Signed)
Intermittent difficulty tolerating the CPAP but generally feels this is helping her symptoms

## 2021-05-25 ENCOUNTER — Telehealth: Payer: Self-pay | Admitting: Family Medicine

## 2021-05-25 NOTE — Telephone Encounter (Signed)
Pt is having a surgical procedure Wednesday. They questioned how much B12 she is taking 100 mcg daily. But back in June she was advised in her l;abs to stop taking B12 and Vit D. She will stop them both.

## 2021-05-25 NOTE — Telephone Encounter (Signed)
Pt called stating that she would like a call back to discuss her B12 vitamin that she's taking.

## 2021-05-26 ENCOUNTER — Other Ambulatory Visit (HOSPITAL_COMMUNITY): Payer: Self-pay | Admitting: Physician Assistant

## 2021-05-26 ENCOUNTER — Encounter (HOSPITAL_COMMUNITY): Payer: PPO

## 2021-05-26 NOTE — H&P (Signed)
Chief Complaint: Patient was seen in consultation today for random liver biopsy at the request of Pyrtle,Jay M  Referring Physician(s): Pyrtle,Jay M  Supervising Physician: Corrie Mckusick  Patient Status: [facility] - Out-pt  History of Present Illness: Hannah Vasquez is a 70 y.o. female with PMHs of CAD, HLD, MI, MVP, osteoporosis, dilatation of esophageal narrowing, and elevated  alk phos has been followed by Dr. Hilarie Fredrickson from GI since February 2021. MRI abdomen MRCP on 04/04/2020 showed striking diffuse hepatic steatosis without biliary ductal dilatation, and patient's LFT started increasing in June 2022. Patient had recent follow-up visit with Dr. Hilarie Fredrickson on 05/06/2021 when a random liver biopsy was recommended to the patient to further evaluate the etiology of elevated alk phos and LFTs.  After thorough discussion and shared decision making patient decided to proceed with the remainder of her biopsy.  IR was requested a random liver biopsy.  Patient laying in bed, not in acute distress.  Denise headache, fever, chills, shortness of breath, cough, chest pain, abdominal pain, nausea ,vomiting, and bleeding.   Past Medical History:  Diagnosis Date   Allergy    Anxiety    CAD (coronary artery disease)    Cataract    Does use hearing aid    bilateral as of 10/05/2019   Elevated alkaline phosphatase level    Hemorrhoids    Hepatic steatosis    History of MI (myocardial infarction) 10/07/2017   Hypertension    Mitral valve prolapse    Osteoporosis    Status post dilation of esophageal narrowing     Past Surgical History:  Procedure Laterality Date   BASAL CELL CARCINOMA EXCISION  02/05/2016   Dr. Sarajane Jews, [facility]   BLADDER SURGERY  11/15/1995   bladder neck suspension, urinary incontinence   CARDIAC CATHETERIZATION  01/03/2017 DES LAD   CATARACT EXTRACTION Left 04/30/2016   Dr. Tommy Rainwater, [place] N/A 01/03/2017   Procedure: Coronary  Stent Intervention;  Surgeon: Nelva Bush, MD;  Location: [facility];  Service: Cardiovascular;  Laterality: N/A;   DENTAL SURGERY  06/20/2016   Tooth Implant; Dr. Kalman Shan   INTRAVASCULAR ULTRASOUND/IVUS N/A 01/03/2017   Procedure: Intravascular Ultrasound/IVUS;  Surgeon: Nelva Bush, MD;  Location: [facility];  Service: Cardiovascular;  Laterality: N/A;   KNEE SURGERY  2003   torn meniscus   LEFT HEART CATH AND CORONARY ANGIOGRAPHY N/A 01/03/2017   Procedure: Left Heart Cath and Coronary Angiography;  Surgeon: Nelva Bush, MD;  Location: [facility];  Service: Cardiovascular;  Laterality: N/A;   MOHS SURGERY Right 2017   right side of face   ROTATOR CUFF REPAIR     SHOULDER SURGERY Right 2005    Allergies: Promethazine-dm, Bactroban [mupirocin calcium], Codeine, Valtrex [valacyclovir hcl], and Penicillins  Medications: Prior to Admission medications   Medication Sig Start Date End Date Taking? Authorizing Provider  acetaminophen (TYLENOL) 500 MG tablet Take 1,000 mg by mouth every 6 (six) hours as needed for moderate pain.   Yes [provider]  albuterol (VENTOLIN HFA) 108 (90 Base) MCG/ACT inhaler Inhale 1 puff into the lungs every 6 (six) hours as needed for wheezing or shortness of breath. 12/12/19  Yes Lesleigh Noe, MD  amLODipine (NORVASC) 5 MG tablet TAKE 1 TABLET BY MOUTH ONCE A DAY 01/19/21  Yes Lesleigh Noe, MD  aspirin EC 81 MG tablet Take 81 mg by mouth every morning.    Yes [provider]  atorvastatin (LIPITOR) 80 MG tablet TAKE 1 TABLET BY MOUTH ONCE A DAY AT [facility] THE EVENING. 04/28/20  Yes Burtis Junes, NP  Calcium-Magnesium-Vitamin D (CALCIUM 1200+D3 PO) Take 1,200 mg by mouth daily.   Yes [provider]  Cholecalciferol (VITAMIN D3) 1000 UNITS CAPS Take 1 capsule by mouth every morning.    Yes [provider]  Cyanocobalamin (B-12 PO) Take 1 capsule by mouth daily.   Yes [provider]  fluticasone (FLONASE) 50 MCG/ACT nasal spray Place 1 spray into both nostrils daily as needed for allergies.   Yes [provider]  furosemide (LASIX) 20 MG tablet Take 1 tablet (20 mg total) by mouth daily as needed. 01/01/19  Yes Jerline Pain, MD  gabapentin (NEURONTIN) 300 MG capsule Take 1 capsule (300 mg total) by mouth 3 (three) times daily. 05/21/21  Yes Lesleigh Noe, MD  hydrochlorothiazide (HYDRODIURIL) 25 MG tablet Take 1 tablet (25 mg total) by mouth daily. 07/21/20 01/28/23 Yes Lesleigh Noe, MD  isosorbide mononitrate (IMDUR) 30 MG 24 hr tablet Take 1 tablet (30 mg total) by mouth daily. 07/21/20  Yes Lesleigh Noe, MD  loratadine (CLARITIN) 10 MG tablet Take 1 tablet (10 mg total) by mouth every morning. 11/08/13  Yes Lucille Passy, MD  losartan (COZAAR) 100 MG tablet TAKE 1 TABLET BY MOUTH ONCE A DAY 09/24/20  Yes Jerline Pain, MD  metoprolol succinate (TOPROL-XL) 25 MG 24 hr tablet Take 1 tablet (25 mg total) by mouth daily. 07/21/20  Yes Lesleigh Noe, MD  Multiple Vitamin (MULTIVITAMIN) tablet Take 1 tablet by mouth every morning.    Yes [provider]  nitroGLYCERIN (NITROSTAT) 0.4 MG SL tablet Place 1 tablet (0.4 mg total) under the tongue every 5 (five) minutes as needed for chest pain. 07/02/19  Yes Burtis Junes, NP  Omega-3 Fatty Acids (FISH OIL) 1200 MG CAPS Take 1 capsule by mouth every morning.    Yes [provider]  omeprazole (PRILOSEC) 40 MG capsule TAKE 1 CAPSULE BY MOUTH TWICE DAILY 05/11/21  Yes Zehr, Janett Billow D, PA-C  potassium chloride SA (KLOR-CON) 20 MEQ tablet TAKE 2 TABLETS BY MOUTH TWICE DAILY 02/13/21  Yes Jerline Pain, MD  traMADol (ULTRAM) 50 MG tablet Take 1 tablet (50 mg total) by mouth every 6 (six) hours as needed for severe pain. 05/21/21  Yes Lesleigh Noe, MD  Turmeric Curcumin 500 MG CAPS Take 500 mg by mouth daily.   Yes [provider]  vitamin E 180 MG (400 UNITS) capsule Take 800 Units  by mouth in the morning and at bedtime.   Yes [provider]     Family History  Problem Relation Age of Onset   Mitral valve prolapse Mother    Hypertension Mother    Hypertension Father    Brain cancer Father    Colon cancer Neg Hx    Stomach cancer Neg Hx    Esophageal cancer Neg Hx    Pancreatic cancer Neg Hx     Social History   Socioeconomic History   Marital status: Divorced    Spouse name: Not on file   Number of children: 2   Years of education: high school   Highest education level: Not on file  Occupational History   Occupation: Medical office---front desk    Comment: Retired  Tobacco Use   Smoking status: Never   Smokeless tobacco: Never  Vaping Use   Vaping Use: Never  used  Substance and Sexual Activity   Alcohol use: No    Alcohol/week: 0.0 standard drinks   Drug use: No   Sexual activity: Not Currently    Birth control/protection: Post-menopausal  Other Topics Concern   Not on file  Social History Narrative   10/09/19   From: the area   Living: alone   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      Family: Product manager and Probation officer (nearby) - keeps in good touch with them, 3 grandchildren      Enjoys: walking, outdoor activities, family time      Exercise: mostly just walking around the house, in cardio rehab at [facility]   Diet: not great currently      Safety   Seat belts: Yes    Guns: No   Safe in relationships: Yes          Social Determinants of Radio broadcast assistant Strain: Low Risk    Difficulty of Paying Living Expenses: Not hard at all  Food Insecurity: No Food Insecurity   Worried About Charity fundraiser in the Last Year: Never true   [place] in the Last Year: Never true  Transportation Needs: No Transportation Needs   Lack of Transportation (Medical): No   Lack of Transportation (Non-Medical): No  Physical Activity: Insufficiently Active   Days of Exercise per Week: 2 days   Minutes of  Exercise per Session: 50 min  Stress: No Stress Concern Present   Feeling of Stress : Not at all  Social Connections: Not on file     Review of Systems: A 12 point ROS discussed and pertinent positives are indicated in the HPI above.  All other systems are negative.  Vital Signs: BP (!) 133/59   Pulse 77   Temp 97.7 F (36.5 C) (Oral)   Resp 18   Ht 5' 2"  (1.575 m)   Wt 158 lb (71.7 kg)   LMP  (LMP Unknown)   SpO2 97%   BMI 28.90 kg/m   Physical Exam Vitals and nursing note reviewed.  Constitutional:      General: Patient is not in acute distress.    Appearance: Normal appearance. Patient is not ill-appearing.  HENT:     Head: Normocephalic and atraumatic.     Mouth/Throat:     Mouth: Mucous membranes are moist.     Pharynx: Oropharynx is clear.  Cardiovascular:     Rate and Rhythm: Normal rate and regular rhythm.     Pulses: Normal pulses.     Heart sounds: Normal heart sounds.  Pulmonary:     Effort: Pulmonary effort is normal.     Breath sounds: Normal breath sounds.  Abdominal:     General: Abdomen is flat. Bowel sounds are normal.     Palpations: Abdomen is soft.  Musculoskeletal:     Cervical back: Neck supple.  Skin:    General: Skin is warm and dry.     Coloration: Skin is not jaundiced or pale.  Neurological:     Mental Status: Patient is alert and oriented to person, place, and time.  Psychiatric:        Mood and Affect: Mood normal.        Behavior: Behavior normal.        Judgment: Judgment normal.    MD Evaluation Airway: WNL Heart: WNL Abdomen: WNL Chest/ Lungs: WNL ASA  Classification: 2 Mallampati/Airway Score: Two  Imaging: No results  found.  Labs:  CBC: Recent Labs    01/29/21 0922 05/06/21 0926 05/27/21 0632  WBC 9.9 9.3 9.4  HGB 12.7 12.2 13.3  HCT 37.3 36.6 40.6  PLT 305.0 296.0 321    COAGS: Recent Labs    05/06/21 0926 05/27/21 0632  INR 1.1* 1.0    BMP: Recent Labs    11/18/20 1208 01/29/21 0922  NA  141 140  K 4.3 3.7  CL 104 102  CO2 25 29  GLUCOSE 125* 121*  BUN 16 11  CALCIUM 10.0 9.7  CREATININE 1.03 0.81    LIVER FUNCTION TESTS: Recent Labs    08/12/20 1109 11/18/20 1208 01/29/21 0922 05/06/21 0926  BILITOT 0.6 0.6 0.7 0.8  AST 37 41* 53* 57*  ALT 25 34 36* 33  ALKPHOS 128*  128* 130* 140* 126*  PROT 7.5 7.9 7.0 7.4  ALBUMIN 4.1 4.4 4.2 4.1    TUMOR MARKERS: No results for input(s): AFPTM, CEA, CA199, CHROMGRNA in the last 8760 hours.  Assessment and Plan: 70 y.o. female with elevated alk phos and LFTs with unknown etiology, in need of tissue sampling for further evaluation and management per GI.  IR was recommended image guided under liver biopsy.  N.p.o. since midnight VSS  CBC all w/in normal limit  INR 1.0 On ASA 81 mg, last dose on Friday 10/7  Allergies reviewed  Risks and benefits of random liver biopsy was discussed with the patient and/or patient's family including, but not limited to bleeding, infection, damage to adjacent structures or low yield requiring additional tests.  All of the questions were answered and there is agreement to proceed.  Consent signed and in chart.   Thank you for this interesting consult.  I greatly enjoyed meeting CHARLI HALLE and look forward to participating in their care.  A copy of this report was sent to the requesting provider on this date.  Electronically Signed: Tera Mater, PA-C 05/27/2021, 8:12 AM   I spent a total of  30 Minutes   in face to face in clinical consultation, greater than 50% of which was counseling/coordinating care for random liver biopsy.  This chart was dictated using voice recognition software.  Despite best efforts to proofread,  errors can occur which can change the documentation meaning.

## 2021-05-27 ENCOUNTER — Other Ambulatory Visit: Payer: Self-pay

## 2021-05-27 ENCOUNTER — Encounter (HOSPITAL_COMMUNITY): Payer: Self-pay

## 2021-05-27 ENCOUNTER — Ambulatory Visit (HOSPITAL_COMMUNITY)
Admission: RE | Admit: 2021-05-27 | Discharge: 2021-05-27 | Disposition: A | Discharge status: Home / Self Care | Payer: PPO | Source: Ambulatory Visit | Attending: Internal Medicine | Admitting: Internal Medicine

## 2021-05-27 DIAGNOSIS — R748 Abnormal levels of other serum enzymes: Secondary | ICD-10-CM | POA: Diagnosis not present

## 2021-05-27 DIAGNOSIS — E785 Hyperlipidemia, unspecified: Secondary | ICD-10-CM | POA: Diagnosis not present

## 2021-05-27 DIAGNOSIS — Z7982 Long term (current) use of aspirin: Secondary | ICD-10-CM | POA: Insufficient documentation

## 2021-05-27 DIAGNOSIS — Z7901 Long term (current) use of anticoagulants: Secondary | ICD-10-CM | POA: Insufficient documentation

## 2021-05-27 DIAGNOSIS — K7401 Hepatic fibrosis, early fibrosis: Secondary | ICD-10-CM | POA: Diagnosis not present

## 2021-05-27 DIAGNOSIS — I252 Old myocardial infarction: Secondary | ICD-10-CM | POA: Diagnosis not present

## 2021-05-27 DIAGNOSIS — I10 Essential (primary) hypertension: Secondary | ICD-10-CM | POA: Diagnosis not present

## 2021-05-27 DIAGNOSIS — M810 Age-related osteoporosis without current pathological fracture: Secondary | ICD-10-CM | POA: Diagnosis not present

## 2021-05-27 DIAGNOSIS — K760 Fatty (change of) liver, not elsewhere classified: Secondary | ICD-10-CM | POA: Insufficient documentation

## 2021-05-27 DIAGNOSIS — I2510 Atherosclerotic heart disease of native coronary artery without angina pectoris: Secondary | ICD-10-CM | POA: Diagnosis not present

## 2021-05-27 DIAGNOSIS — K7581 Nonalcoholic steatohepatitis (NASH): Secondary | ICD-10-CM | POA: Diagnosis not present

## 2021-05-27 DIAGNOSIS — Z79899 Other long term (current) drug therapy: Secondary | ICD-10-CM | POA: Insufficient documentation

## 2021-05-27 LAB — CBC
HCT: 40.6 % (ref 36.0–46.0)
Hemoglobin: 13.3 g/dL (ref 12.0–15.0)
MCH: 27.1 pg (ref 26.0–34.0)
MCHC: 32.8 g/dL (ref 30.0–36.0)
MCV: 82.9 fL (ref 80.0–100.0)
Platelets: 321 10*3/uL (ref 150–400)
RBC: 4.9 MIL/uL (ref 3.87–5.11)
RDW: 14.3 % (ref 11.5–15.5)
WBC: 9.4 10*3/uL (ref 4.0–10.5)
nRBC: 0 % (ref 0.0–0.2)

## 2021-05-27 LAB — PROTIME-INR
INR: 1 (ref 0.8–1.2)
Prothrombin Time: 13.6 seconds (ref 11.4–15.2)

## 2021-05-27 MED ORDER — FENTANYL CITRATE (PF) 100 MCG/2ML IJ SOLN
INTRAMUSCULAR | Status: AC
  Filled 2021-05-27: qty 2

## 2021-05-27 MED ORDER — MIDAZOLAM HCL 2 MG/2ML IJ SOLN
INTRAMUSCULAR | Status: DC | PRN
  Administered 2021-05-27: .5 mg via INTRAVENOUS

## 2021-05-27 MED ORDER — LIDOCAINE HCL (PF) 1 % IJ SOLN
INTRAMUSCULAR | Status: AC
  Filled 2021-05-27: qty 30

## 2021-05-27 MED ORDER — FENTANYL CITRATE (PF) 100 MCG/2ML IJ SOLN
INTRAMUSCULAR | Status: DC | PRN
  Administered 2021-05-27: 25 ug via INTRAVENOUS

## 2021-05-27 MED ORDER — SODIUM CHLORIDE 0.9 % IV SOLN: INTRAVENOUS | Status: DC

## 2021-05-27 MED ORDER — MIDAZOLAM HCL 2 MG/2ML IJ SOLN
INTRAMUSCULAR | Status: AC
  Filled 2021-05-27: qty 2

## 2021-05-27 MED ORDER — GELATIN ABSORBABLE 12-7 MM EX MISC
CUTANEOUS | Status: AC
  Filled 2021-05-27: qty 1

## 2021-05-27 NOTE — Procedures (Signed)
Interventional Radiology Procedure Note  Procedure: US guided biopsy of liver, medical liver Complications: None EBL: None Recommendations: - Bedrest 2 hours.   - Routine wound care - Follow up pathology - Advance diet   Signed,  Corrie Mckusick, DO

## 2021-05-28 ENCOUNTER — Encounter (HOSPITAL_COMMUNITY): Payer: PPO

## 2021-05-29 LAB — SURGICAL PATHOLOGY

## 2021-06-01 DIAGNOSIS — M5416 Radiculopathy, lumbar region: Secondary | ICD-10-CM | POA: Diagnosis not present

## 2021-06-01 DIAGNOSIS — M5126 Other intervertebral disc displacement, lumbar region: Secondary | ICD-10-CM | POA: Diagnosis not present

## 2021-06-02 ENCOUNTER — Encounter (HOSPITAL_COMMUNITY): Admission: RE | Admit: 2021-06-02 | Payer: PPO | Source: Ambulatory Visit

## 2021-06-02 ENCOUNTER — Other Ambulatory Visit: Payer: Self-pay

## 2021-06-02 DIAGNOSIS — G4733 Obstructive sleep apnea (adult) (pediatric): Secondary | ICD-10-CM | POA: Diagnosis not present

## 2021-06-04 ENCOUNTER — Other Ambulatory Visit: Payer: Self-pay

## 2021-06-04 ENCOUNTER — Encounter (HOSPITAL_COMMUNITY): Payer: PPO

## 2021-06-04 ENCOUNTER — Telehealth: Payer: Self-pay | Admitting: Internal Medicine

## 2021-06-04 DIAGNOSIS — K760 Fatty (change of) liver, not elsewhere classified: Secondary | ICD-10-CM

## 2021-06-04 NOTE — Telephone Encounter (Signed)
Inbound call from patient requesting results from liver biopsy

## 2021-06-04 NOTE — Telephone Encounter (Signed)
I will comment on the path results attached to the liver biopsy and send it to you in the next few minutes

## 2021-06-09 ENCOUNTER — Other Ambulatory Visit: Payer: Self-pay

## 2021-06-09 ENCOUNTER — Encounter (HOSPITAL_COMMUNITY)
Admission: RE | Admit: 2021-06-09 | Discharge: 2021-06-09 | Disposition: A | Discharge status: Home / Self Care | Payer: PPO | Source: Ambulatory Visit | Attending: Cardiology | Admitting: Cardiology

## 2021-06-11 ENCOUNTER — Other Ambulatory Visit: Payer: Self-pay

## 2021-06-11 ENCOUNTER — Encounter (HOSPITAL_COMMUNITY)
Admission: RE | Admit: 2021-06-11 | Discharge: 2021-06-11 | Disposition: A | Discharge status: Home / Self Care | Payer: PPO | Source: Ambulatory Visit | Attending: Cardiology | Admitting: Cardiology

## 2021-06-11 ENCOUNTER — Other Ambulatory Visit: Payer: PPO

## 2021-06-11 DIAGNOSIS — K760 Fatty (change of) liver, not elsewhere classified: Secondary | ICD-10-CM | POA: Diagnosis not present

## 2021-06-12 LAB — ANGIOTENSIN CONVERTING ENZYME: Angiotensin-Converting Enzyme: 80 U/L — ABNORMAL HIGH (ref 9–67)

## 2021-06-15 ENCOUNTER — Other Ambulatory Visit: Payer: Self-pay | Admitting: Cardiology

## 2021-06-16 ENCOUNTER — Encounter (HOSPITAL_COMMUNITY)
Admission: RE | Admit: 2021-06-16 | Discharge: 2021-06-16 | Disposition: A | Discharge status: Home / Self Care | Payer: PPO | Source: Ambulatory Visit | Attending: Cardiology | Admitting: Cardiology

## 2021-06-16 ENCOUNTER — Other Ambulatory Visit: Payer: Self-pay

## 2021-06-16 DIAGNOSIS — Z955 Presence of coronary angioplasty implant and graft: Secondary | ICD-10-CM | POA: Insufficient documentation

## 2021-06-16 DIAGNOSIS — Z48812 Encounter for surgical aftercare following surgery on the circulatory system: Secondary | ICD-10-CM | POA: Diagnosis not present

## 2021-06-18 ENCOUNTER — Encounter (HOSPITAL_COMMUNITY): Payer: PPO

## 2021-06-19 ENCOUNTER — Telehealth: Payer: Self-pay | Admitting: Internal Medicine

## 2021-06-19 DIAGNOSIS — K760 Fatty (change of) liver, not elsewhere classified: Secondary | ICD-10-CM

## 2021-06-19 DIAGNOSIS — R748 Abnormal levels of other serum enzymes: Secondary | ICD-10-CM

## 2021-06-19 NOTE — Telephone Encounter (Signed)
I called patient and spoke to her by phone.  Via speaker phone her 2 daughters were also present for the entire conversation.  Patient with history of elevated liver enzymes.  Fatty liver by imaging.  Liver biopsy was performed recently which revealed NASH with stage II fibrosis.  There were granulomas seen in the liver biopsy which is not expected with fatty liver alone.  I then checked an ACE level which was elevated.  This raises the question of sarcoidosis.  Of note she has had intermittent dyspnea, particular with exertion.  She has a history of CAD with PCI and follows with Dr. Marlou Porch.  Her dyspnea is not necessarily felt related to her coronary artery disease.  She also saw Dr. Lamonte Sakai in 2021 and at that point had an unremarkable chest x-ray.  We discussed both all NASH and sarcoid at length.  Sarcoidosis can certainly involve the liver but often the lungs as well.  I have recommended the following given her possible to causes of liver inflammation which would be treated definitively quite different and also her ongoing dyspnea.  1.  Referral to [facility] for evaluation for possible liver sarcoidosis after recent biopsy; also in a patient with NASH.  I would like their opinion regarding whether they recommend specific steroid therapy for sarcoid. 2.  After liver consult she will return to my clinic; I reviewed all NASH treatment with her and her daughters today.  I would like her to continue the goal of 5 to 10% weight loss in the next 12 months.  They are all working on this together.  I recommended 30 minutes of moderate exercise 3 to 4 days/week when possible.  Continue Vitamin E 800 IU daily 3.  Follow-up with Dr. Lamonte Sakai in light of possible liver sarcoidosis and elevated ACE level; I will reach out to Dr. Lamonte Sakai to see if he can see the patient in clinic for follow-up and to consider further evaluation for possible pulmonary sarcoidosis 4.  Follow-up with me in January or February, best  after she has been seen in liver clinic  Time provided for questions and answers and she thanked me for the call

## 2021-06-22 NOTE — Telephone Encounter (Signed)
Referral faxed to [facility] liver care.

## 2021-06-23 ENCOUNTER — Telehealth: Payer: Self-pay | Admitting: Cardiology

## 2021-06-23 ENCOUNTER — Encounter (HOSPITAL_COMMUNITY): Payer: PPO

## 2021-06-23 DIAGNOSIS — Z48812 Encounter for surgical aftercare following surgery on the circulatory system: Secondary | ICD-10-CM | POA: Diagnosis not present

## 2021-06-23 DIAGNOSIS — I10 Essential (primary) hypertension: Secondary | ICD-10-CM

## 2021-06-23 MED ORDER — NITROGLYCERIN 0.4 MG SL SUBL: 0.4000 mg | SUBLINGUAL_TABLET | SUBLINGUAL | 3 refills | Status: DC | PRN

## 2021-06-23 NOTE — Telephone Encounter (Signed)
Chart reviewed.  Rx(s) sent to pharmacy electronically.  Patient notified and voiced understanding.

## 2021-06-23 NOTE — Telephone Encounter (Signed)
*  STAT* If patient is at the pharmacy, call can be transferred to refill team.   1. Which medications need to be refilled? (please list name of each medication and dose if known)  nitroGLYCERIN (NITROSTAT) 0.4 MG SL tablet  2. Which pharmacy/location (including street and city if local pharmacy) is medication to be sent to? [place], [place]  3. Do they need a 30 day or 90 day supply? 30 with refills

## 2021-06-25 ENCOUNTER — Encounter (HOSPITAL_COMMUNITY): Payer: PPO

## 2021-06-29 ENCOUNTER — Telehealth: Payer: Self-pay | Admitting: Internal Medicine

## 2021-06-29 NOTE — Telephone Encounter (Signed)
Patient called regarding the referral to the pulmonologist.  She still has not heard from their office yet.  Can you please call and advise?  Thank you.

## 2021-06-29 NOTE — Telephone Encounter (Signed)
Dr Mamie Nick- Were you able to discuss follow up with Dr Lamonte Sakai?

## 2021-06-29 NOTE — Telephone Encounter (Signed)
I have spoken to patient to advise that we have reached out to Dr Lamonte Sakai and she will be hearing something hopefully this week. I also advised that I will check her chart to be sure this is followed up on.

## 2021-06-29 NOTE — Telephone Encounter (Signed)
Hannah Vasquez I reached out to Dr. Lamonte Sakai today again. Please keep this on our radar and I will let you know if I hear back from him in the next several days I wanted her to see him and also [facility] given the possibility of hepatic sarcoid, but also NASH Thanks JMP

## 2021-06-30 ENCOUNTER — Encounter (HOSPITAL_COMMUNITY): Payer: PPO

## 2021-07-01 DIAGNOSIS — N3946 Mixed incontinence: Secondary | ICD-10-CM | POA: Diagnosis not present

## 2021-07-01 DIAGNOSIS — R350 Frequency of micturition: Secondary | ICD-10-CM | POA: Diagnosis not present

## 2021-07-02 ENCOUNTER — Encounter (HOSPITAL_COMMUNITY): Payer: PPO

## 2021-07-03 ENCOUNTER — Telehealth: Payer: Self-pay | Admitting: Emergency Medicine

## 2021-07-03 DIAGNOSIS — G4733 Obstructive sleep apnea (adult) (pediatric): Secondary | ICD-10-CM | POA: Diagnosis not present

## 2021-07-03 NOTE — Telephone Encounter (Signed)
We need to set up Hannah Vasquez for an office visit with APP and then with RB to evaluate progressive shortness of breath.  She has a history of sarcoidosis (newly diagnosed on liver biopsy) and probably needs repeat pulmonary function testing, chest imaging.  Thank you

## 2021-07-06 NOTE — Telephone Encounter (Signed)
Spoke with pt and scheduled for OV with Tammy Parrett on 07/21/21 at 9:30. Nothing further needed at this time.   Routing to [employer] as [employer]

## 2021-07-07 ENCOUNTER — Encounter (HOSPITAL_COMMUNITY): Payer: PPO

## 2021-07-14 ENCOUNTER — Encounter (HOSPITAL_COMMUNITY): Payer: PPO

## 2021-07-14 DIAGNOSIS — M25562 Pain in left knee: Secondary | ICD-10-CM | POA: Diagnosis not present

## 2021-07-14 DIAGNOSIS — M25561 Pain in right knee: Secondary | ICD-10-CM | POA: Diagnosis not present

## 2021-07-15 DIAGNOSIS — N904 Leukoplakia of vulva: Secondary | ICD-10-CM | POA: Diagnosis not present

## 2021-07-15 DIAGNOSIS — N898 Other specified noninflammatory disorders of vagina: Secondary | ICD-10-CM | POA: Diagnosis not present

## 2021-07-15 DIAGNOSIS — N766 Ulceration of vulva: Secondary | ICD-10-CM | POA: Diagnosis not present

## 2021-07-17 DIAGNOSIS — K7401 Hepatic fibrosis, early fibrosis: Secondary | ICD-10-CM | POA: Diagnosis not present

## 2021-07-17 DIAGNOSIS — R748 Abnormal levels of other serum enzymes: Secondary | ICD-10-CM | POA: Diagnosis not present

## 2021-07-17 DIAGNOSIS — K7581 Nonalcoholic steatohepatitis (NASH): Secondary | ICD-10-CM | POA: Diagnosis not present

## 2021-07-17 DIAGNOSIS — K753 Granulomatous hepatitis, not elsewhere classified: Secondary | ICD-10-CM | POA: Diagnosis not present

## 2021-07-20 DIAGNOSIS — M170 Bilateral primary osteoarthritis of knee: Secondary | ICD-10-CM | POA: Diagnosis not present

## 2021-07-21 ENCOUNTER — Ambulatory Visit: Payer: PPO | Admitting: Adult Health

## 2021-07-21 ENCOUNTER — Other Ambulatory Visit: Payer: Self-pay

## 2021-07-21 ENCOUNTER — Encounter: Payer: Self-pay | Admitting: Adult Health

## 2021-07-21 ENCOUNTER — Other Ambulatory Visit: Payer: Self-pay | Admitting: Family Medicine

## 2021-07-21 VITALS — BP 114/60 | HR 71 | Temp 98.7°F | Ht 62.0 in | Wt 160.2 lb

## 2021-07-21 DIAGNOSIS — R0602 Shortness of breath: Secondary | ICD-10-CM

## 2021-07-21 DIAGNOSIS — D869 Sarcoidosis, unspecified: Secondary | ICD-10-CM | POA: Diagnosis not present

## 2021-07-21 DIAGNOSIS — J4520 Mild intermittent asthma, uncomplicated: Secondary | ICD-10-CM

## 2021-07-21 NOTE — Assessment & Plan Note (Signed)
Shortness of breath with activity.  Possibly underlying intermittent asthma.  Dyspnea seems to have improved over the last year.  Previous PFTs have shown no significant airflow obstruction or restriction.  Had a mildly decreased diffusing capacity in 2019.  Chest x-ray reviewed 2021 showed no acute process or adenopathy.  Chart review with contrasted CT chest in 2014 showed no evidence of parenchymal changes or adenopathy. Recent diagnosis of hepatic sarcoidosis with elevated ACE level and nonnecrotizing granulomas on liver biopsies superimposed on steatohepatitis.  We will check a CT chest with contrast looking for sarcoidosis changes in lung parenchyma and possible adenopathy. If CT chest shows sarcoidosis changes consider repeat spirometry with DLCO. Discussed yearly eye exams.  Plan  Patient Instructions  Set up CT Chest with contrast -Sarcoidosis  Need yearly eye exams.  Albuterol inhaler As needed   Activity as tolerated Follow up with Dr. Lamonte Sakai  6-8 weeks and As needed

## 2021-07-21 NOTE — Patient Instructions (Addendum)
Set up CT Chest with contrast -Sarcoidosis  Need yearly eye exams.  Albuterol inhaler As needed   Activity as tolerated Follow up with Dr. Lamonte Sakai  6-8 weeks and As needed

## 2021-07-21 NOTE — Progress Notes (Signed)
@Patient  ID: Hannah Vasquez, female    DOB: April 10, 1951, 70 y.o.   MRN: [id number]  Chief Complaint  Patient presents with   Follow-up    Referring provider: Lesleigh Noe, MD  HPI: 70 year old female never smoker seen for pulmonary consult in 2019 for shortness of breath,  Medical history significant for coronary artery disease status post MI in 2019 followed by cardiology  TEST/EVENTS :  February 22, 2020 Spirometry showed normal lung function with no airflow obstruction or restriction.  FEV1 was 93% ratio 81 and FVC 87%.  08/2017 PFT normal , DLCO 76%   June 2021  that showed a normal CBC TSH T4 T3.  And a normal IgG, anti-smooth muscle, ANA, mitochondrial antibody and smooth muscle antibody were negative.  Walk test in the office last visit showed no desaturations on room air.  01/2020 CXR with nad   07/21/2021 Follow up : Dyspnea /Intermittent Asthma  Patient returns for a follow-up visit.  She was last seen for a video visit in July 2021.  Patient has been followed for intermittent shortness of breath with activities.  Patient was felt to have a possible component of intermittent asthma.  She was started on Breo with some perceived benefit.  She says about a year ago she started to feel better.  She stopped her Breo.  She is had no flare of symptoms.  No cough or wheezing.  She felt that the [facility] was too expensive.  She says that she has been much more active.  Has not noticed that her shortness of breath is a huge issue.  Occasionally gets short of breath with heavy activities but remains very active and walks a lot. She has rare albuterol use.  Previous spirometry July 2021 showed normal lung function.  Previous PFTs and 2019 showed normal lung function with a slightly decreased DLCO at 76%. She has been referred back to our office after recent diagnosis of hepatic sarcoidosis to rule out possible pulmonary involvement  Patient is been following with gastroenterology for hepatic  steatosis.  She recently underwent a liver biopsy with pathology positive for moderately active steatohepatitis and few nonnecrotizing granulomas, and mild fibrosis.  ACE level was elevated at 80 .  This was concerning for overlapping hepatic sarcoidosis.  Patient denies any cough, rash.  Patient gets yearly eye exams.  She has 1 later this week.  Chest x-ray June 2021 showed clear lungs.  Chart review shows patient had a CT chest angio in April 2014  that showed only minimal Subsegmental bibasilar atelectasis.  No adenopathy..  his home   Allergies  Allergen Reactions   Promethazine-Dm Nausea And Vomiting   Bactroban [Mupirocin Calcium]     Facial redness and swelling   Codeine Nausea And Vomiting   Valtrex [Valacyclovir Hcl] Swelling    Per pt causes redness   Penicillins Nausea And Vomiting and Rash    Immunization History  Administered Date(s) Administered   Fluad Quad(high Dose 65+) 04/09/2019, 04/26/2020, 05/21/2021   Influenza Split 05/27/2011   Influenza Whole 06/04/2009   Influenza,inj,Quad PF,6+ Mos 06/07/2013, 06/12/2014, 05/09/2015, 06/11/2016, 06/03/2017, 04/27/2018   Pneumococcal Conjugate-13 11/27/2014   Pneumococcal Polysaccharide-23 11/08/2013   Td 08/16/1993   Tdap 09/09/2011   Zoster, Live 11/19/2009    Past Medical History:  Diagnosis Date   Allergy    Anxiety    CAD (coronary artery disease)    Cataract    Does use hearing aid    bilateral as of 10/05/2019  Elevated alkaline phosphatase level    Hemorrhoids    Hepatic steatosis    History of MI (myocardial infarction) 10/07/2017   Hypertension    Mitral valve prolapse    Osteoporosis    Status post dilation of esophageal narrowing     Tobacco History: Social History   Tobacco Use  Smoking Status Never  Smokeless Tobacco Never   Counseling given: Not Answered   Outpatient Medications Prior to Visit  Medication Sig Dispense Refill   acetaminophen (TYLENOL) 500 MG tablet Take 1,000 mg by  mouth every 6 (six) hours as needed for moderate pain.     albuterol (VENTOLIN HFA) 108 (90 Base) MCG/ACT inhaler Inhale 1 puff into the lungs every 6 (six) hours as needed for wheezing or shortness of breath. 18 g 1   amLODipine (NORVASC) 5 MG tablet TAKE 1 TABLET BY MOUTH ONCE A DAY 90 tablet 2   aspirin EC 81 MG tablet Take 81 mg by mouth every morning.      atorvastatin (LIPITOR) 80 MG tablet TAKE 1 TABLET BY MOUTH ONCE A DAY AT [facility] THE EVENING. 90 tablet 3   Calcium-Magnesium-Vitamin D (CALCIUM 1200+D3 PO) Take 1,200 mg by mouth daily.     clobetasol ointment (TEMOVATE) 0.05 % clobetasol 0.05 % topical ointment  APPLY A THIN LAYER TO THE AFFECTED AREA(S) BY TOPICAL ROUTE 2 TIMES PER DAY     Cyanocobalamin (B-12 PO) Take 1 capsule by mouth daily.     fluticasone (FLONASE) 50 MCG/ACT nasal spray Place 1 spray into both nostrils daily as needed for allergies.     furosemide (LASIX) 20 MG tablet Take 1 tablet (20 mg total) by mouth daily as needed. 90 tablet 3   gabapentin (NEURONTIN) 300 MG capsule Take 1 capsule (300 mg total) by mouth 3 (three) times daily. 270 capsule 3   hydrochlorothiazide (HYDRODIURIL) 25 MG tablet Take 1 tablet (25 mg total) by mouth daily. 90 tablet 3   isosorbide mononitrate (IMDUR) 30 MG 24 hr tablet Take 1 tablet (30 mg total) by mouth daily. 90 tablet 3   loratadine (CLARITIN) 10 MG tablet Take 1 tablet (10 mg total) by mouth every morning. 30 tablet 2   losartan (COZAAR) 100 MG tablet TAKE 1 TABLET BY MOUTH ONCE A DAY 90 tablet 3   metoprolol succinate (TOPROL-XL) 25 MG 24 hr tablet Take 1 tablet (25 mg total) by mouth daily. 90 tablet 3   Multiple Vitamin (MULTIVITAMIN) tablet Take 1 tablet by mouth every morning.      nitroGLYCERIN (NITROSTAT) 0.4 MG SL tablet Place 1 tablet (0.4 mg total) under the tongue every 5 (five) minutes as needed for chest pain. 25 tablet 3   Omega-3 Fatty Acids (FISH OIL) 1200 MG CAPS Take 1 capsule by mouth every morning.       omeprazole (PRILOSEC) 40 MG capsule TAKE 1 CAPSULE BY MOUTH TWICE DAILY 60 capsule 6   potassium chloride SA (KLOR-CON) 20 MEQ tablet TAKE 2 TABLETS BY MOUTH TWICE DAILY 360 tablet 3   traMADol (ULTRAM) 50 MG tablet Take 1 tablet (50 mg total) by mouth every 6 (six) hours as needed for severe pain. 90 tablet 5   ursodiol (ACTIGALL) 300 MG capsule Take by mouth.     vitamin E 180 MG (400 UNITS) capsule Take 800 Units by mouth in the morning and at bedtime.     Cholecalciferol (VITAMIN D3) 1000 UNITS CAPS Take 1 capsule by mouth every morning.  (Patient not taking: Reported  on 07/21/2021)     Turmeric Curcumin 500 MG CAPS Take 500 mg by mouth daily. (Patient not taking: Reported on 07/21/2021)     No facility-administered medications prior to visit.     Review of Systems:   Constitutional:   No  weight loss, night sweats,  Fevers, chills, + fatigue, or  lassitude.  HEENT:   No headaches,  Difficulty swallowing,  Tooth/dental problems, or  Sore throat,                No sneezing, itching, ear ache, nasal congestion, post nasal drip,   CV:  No chest pain,  Orthopnea, PND, swelling in lower extremities, anasarca, dizziness, palpitations, syncope.   GI  No heartburn, indigestion, abdominal pain, nausea, vomiting, diarrhea, change in bowel habits, loss of appetite, bloody stools.   Resp:   No excess mucus, no productive cough,  No non-productive cough,  No coughing up of blood.  No change in color of mucus.  No wheezing.  No chest wall deformity  Skin: no rash or lesions.  GU: no dysuria, change in color of urine, no urgency or frequency.  No flank pain, no hematuria   MS:  No joint pain or swelling.  No decreased range of motion.  No back pain.    Physical Exam  BP 114/60 (BP Location: Left Arm, Patient Position: Sitting, Cuff Size: Normal)   Pulse 71   Temp 98.7 F (37.1 C) (Oral)   Ht 5' 2"  (1.575 m)   Wt 160 lb 3.2 oz (72.7 kg)   LMP  (LMP Unknown)   SpO2 97%   BMI 29.30 kg/m    GEN: A/Ox3; pleasant , NAD, well nourished    HEENT:  [place]/AT,   NOSE-clear, THROAT-clear, no lesions, no postnasal drip or exudate noted.   NECK:  Supple w/ fair ROM; no JVD; normal carotid impulses w/o bruits; no thyromegaly or nodules palpated; no lymphadenopathy.    RESP  Clear  P & A; w/o, wheezes/ rales/ or rhonchi. no accessory muscle use, no dullness to percussion  CARD:  RRR, no m/r/g, no peripheral edema, pulses intact, no cyanosis or clubbing.  GI:   Soft & nt; nml bowel sounds; no organomegaly or masses detected.   Musco: Warm bil, no deformities or joint swelling noted.   Neuro: alert, no focal deficits noted.    Skin: Warm, no lesions or rashes    Lab Results:  CBC    Component Value Date/Time   WBC 9.4 05/27/2021 0632   RBC 4.90 05/27/2021 0632   HGB 13.3 05/27/2021 0632   HGB 12.9 01/25/2020 1631   HCT 40.6 05/27/2021 0632   HCT 38.7 01/25/2020 1631   PLT 321 05/27/2021 0632   PLT 379 01/25/2020 1631   MCV 82.9 05/27/2021 0632   MCV 84 01/25/2020 1631   MCH 27.1 05/27/2021 0632   MCHC 32.8 05/27/2021 0632   RDW 14.3 05/27/2021 0632   RDW 13.7 01/25/2020 1631   LYMPHSABS 3.0 05/06/2021 0926   MONOABS 0.6 05/06/2021 0926   EOSABS 0.3 05/06/2021 0926   BASOSABS 0.0 05/06/2021 0926    BMET    Component Value Date/Time   NA 140 01/29/2021 0922   NA 141 11/09/2019 0844   K 3.7 01/29/2021 0922   CL 102 01/29/2021 0922   CO2 29 01/29/2021 0922   GLUCOSE 121 (H) 01/29/2021 0922   BUN 11 01/29/2021 0922   BUN 11 11/09/2019 0844   CREATININE 0.81 01/29/2021 0922   CALCIUM  9.7 01/29/2021 0922   GFRNONAA 58 (L) 11/09/2019 0844   GFRAA 67 11/09/2019 0844    BNP    Component Value Date/Time   BNP 31.2 08/21/2017 2058    ProBNP    Component Value Date/Time   PROBNP 21.0 07/26/2018 1053    Imaging: No results found.    PFT Results Latest Ref Rng & Units 09/06/2017  FVC-Pre L 2.53  FVC-Predicted Pre % 90  FVC-Post L 2.36  FVC-Predicted  Post % 84  Pre FEV1/FVC % % 82  Post FEV1/FCV % % 86  FEV1-Pre L 2.08  FEV1-Predicted Pre % 98  FEV1-Post L 2.04  DLCO uncorrected ml/min/mmHg 16.46  DLCO UNC% % 76  DLCO corrected ml/min/mmHg 16.78  DLCO COR %Predicted % 77  DLVA Predicted % 86  TLC L 4.61  TLC % Predicted % 97  RV % Predicted % 92    No results found for: NITRICOXIDE      Assessment & Plan:   Shortness of breath Shortness of breath with activity.  Possibly underlying intermittent asthma.  Dyspnea seems to have improved over the last year.  Previous PFTs have shown no significant airflow obstruction or restriction.  Had a mildly decreased diffusing capacity in 2019.  Chest x-ray reviewed 2021 showed no acute process or adenopathy.  Chart review with contrasted CT chest in 2014 showed no evidence of parenchymal changes or adenopathy. Recent diagnosis of hepatic sarcoidosis with elevated ACE level and nonnecrotizing granulomas on liver biopsies superimposed on steatohepatitis.  We will check a CT chest with contrast looking for sarcoidosis changes in lung parenchyma and possible adenopathy. If CT chest shows sarcoidosis changes consider repeat spirometry with DLCO. Discussed yearly eye exams.  Plan  Patient Instructions  Set up CT Chest with contrast -Sarcoidosis  Need yearly eye exams.  Albuterol inhaler As needed   Activity as tolerated Follow up with Dr. Lamonte Sakai  6-8 weeks and As needed        Reactive airway disease Albuterol as needed.     Rexene Edison, NP 07/21/2021

## 2021-07-21 NOTE — Assessment & Plan Note (Signed)
Albuterol as needed 

## 2021-07-22 ENCOUNTER — Ambulatory Visit (INDEPENDENT_AMBULATORY_CARE_PROVIDER_SITE_OTHER): Payer: PPO | Admitting: Internal Medicine

## 2021-07-22 ENCOUNTER — Encounter: Payer: Self-pay | Admitting: Internal Medicine

## 2021-07-22 VITALS — BP 142/82 | HR 78 | Ht 62.0 in

## 2021-07-22 DIAGNOSIS — K760 Fatty (change of) liver, not elsewhere classified: Secondary | ICD-10-CM | POA: Diagnosis not present

## 2021-07-22 DIAGNOSIS — R748 Abnormal levels of other serum enzymes: Secondary | ICD-10-CM | POA: Diagnosis not present

## 2021-07-22 DIAGNOSIS — R1013 Epigastric pain: Secondary | ICD-10-CM

## 2021-07-22 NOTE — Addendum Note (Signed)
Addended by: Vanessa Barbara on: 07/22/2021 05:26 PM   Modules accepted: Orders

## 2021-07-22 NOTE — Patient Instructions (Addendum)
Continue Vitamin E 800IU daily.   Continue Vitamin D 2080m daily.   Follow up in 3 months. Office will contact you to schedule appointment.   If you are age 6914or older, your body mass index should be between 23-30. Your Body mass index is 29.3 kg/m. If this is out of the aforementioned range listed, please consider follow up with your Primary Care Provider.  The [facility] providers would like to encourage you to use [facility] communicate with providers for non-urgent requests or questions.  Due to long hold times on the telephone, sending your provider a message by [facility] be a faster and more efficient way to get a response.  Please allow 48 business hours for a response.  Please remember that this is for non-urgent requests.  _______________________________________________________  Thank you for choosing me and [facility].  Dr. PHilarie Fredrickson

## 2021-07-22 NOTE — Progress Notes (Addendum)
Subjective:    Patient ID: Hannah Vasquez, female    DOB: 23-Jan-1951, 70 y.o.   MRN: [id number]  HPI Monasia Lair is a 70 year old female with a history of GERD, fatty liver and elevated liver enzymes, epigastric pain responsive to PPI, hypertension, CAD and osteoporosis who is here for follow-up.  She is here today with her daughter.  Recently we performed a liver biopsy to evaluate her elevated liver enzymes and suspicion for NASH.  This was performed and showed moderately active steatohepatitis with mild fibrosis stage II of 4.  On review of the path there were nonnecrotizing granulomas raising the question of overlapping sarcoid.  She then had an ACE level drawn which was elevated.  She was referred to Dr. Lamonte Sakai in pulmonary and is well as do not Drazek, NP with with [place].  She reports that she is feeling well of late.  She has been getting bilateral knee injections with gel to help with significant knee pain due to arthritis.  No new abdominal pain.  No itching, jaundice, swelling in the abdomen or lower extremities.  She saw liver clinic, Roosevelt Locks, NP, last Friday and was told to start ursodiol which she is taking 300 mg twice daily, continue Vitamin E and continue to work on weight loss through diet and exercise.  Her liver biopsy is being reviewed in [place] by the hepatology pathologist.  The full note from this clinic visit is not yet available for my review.  She also yesterday pulmonology who ordered a dedicated CT scan of the lungs to evaluate for sarcoid.   Review of Systems As per HPI, otherwise negative  Current Medications, Allergies, Past Medical History, Past Surgical History, Family History and Social History were reviewed in [employer] record.    Objective:   Physical Exam BP (!) 142/82   Pulse 78   Ht 5' 2"  (1.575 m)   LMP  (LMP Unknown)   SpO2 98%   BMI 29.30 kg/m  Gen: awake, alert, NAD HEENT: anicteric Neuro:  nonfocal  ACE = 80  CMP     Component Value Date/Time   NA 140 01/29/2021 0922   NA 141 11/09/2019 0844   K 3.7 01/29/2021 0922   CL 102 01/29/2021 0922   CO2 29 01/29/2021 0922   GLUCOSE 121 (H) 01/29/2021 0922   BUN 11 01/29/2021 0922   BUN 11 11/09/2019 0844   CREATININE 0.81 01/29/2021 0922   CALCIUM 9.7 01/29/2021 0922   PROT 7.4 05/06/2021 0926   PROT 6.7 07/02/2019 1104   ALBUMIN 4.1 05/06/2021 0926   ALBUMIN 4.2 07/02/2019 1104   AST 57 (H) 05/06/2021 0926   ALT 33 05/06/2021 0926   ALKPHOS 126 (H) 05/06/2021 0926   BILITOT 0.8 05/06/2021 0926   BILITOT 0.8 07/02/2019 1104   GFRNONAA 58 (L) 11/09/2019 0844   GFRAA 67 11/09/2019 0844   CBC    Component Value Date/Time   WBC 9.4 05/27/2021 0632   RBC 4.90 05/27/2021 0632   HGB 13.3 05/27/2021 0632   HGB 12.9 01/25/2020 1631   HCT 40.6 05/27/2021 0632   HCT 38.7 01/25/2020 1631   PLT 321 05/27/2021 0632   PLT 379 01/25/2020 1631   MCV 82.9 05/27/2021 0632   MCV 84 01/25/2020 1631   MCH 27.1 05/27/2021 0632   MCHC 32.8 05/27/2021 0632   RDW 14.3 05/27/2021 0632   RDW 13.7 01/25/2020 1631   LYMPHSABS 3.0 05/06/2021 0926   MONOABS 0.6 05/06/2021  0926   EOSABS 0.3 05/06/2021 0926   BASOSABS 0.0 05/06/2021 7017   Lab Results  Component Value Date   INR 1.0 05/27/2021   INR 1.1 (H) 05/06/2021   INR 1.0 09/25/2019         Assessment & Plan:  70 year old female with a history of GERD, fatty liver and elevated liver enzymes, epigastric pain responsive to PPI, hypertension, CAD and osteoporosis who is here for follow-up.  Elevated liver enzymes/NASH (Fibrosis 2 of 18 Jun 2021)/? Sarcoid and PBC --with initiation of ursodiol this raises the question of PBC.  I am going to reach out to [facility], NP directly to get her opinion and also determine what dose of ursodiol they are currently recommending.  I think we will learn more after they review her liver pathology --Further consultation with [facility] --She will continue Vitamin E 800 IU daily --She will continue ursodiol 300 mg twice daily for now --Daily multivitamin is recommended --She will work to lose 10% of her body weight over the next year; we reviewed Mediterranean diet as well as diet low in saturated fats and carbohydrates --Monitor liver enzymes over time  Addendum: I spoke to Roosevelt Locks, NP and she did start ursodiol 300 mg BID for the possibility of sarcoid.  Limited data suggest that this may help in sarcoidosis of the liver.  Also would help should this be PBC.  Biopsy can be unreliable for PBC and she does have elevation in alk phos.  She will continue the ursodiol and be followed up again in liver clinic in 6 months for additional lab work.  Main issue continues to be the fatty liver which we are working on as above.  Her pathology slides will be reviewed in [place] by the hepatopathologists.  2.  Possible sarcoid --certainly could overlap with #1.  Follow-up chest CT as ordered by pulmonology.  She will follow-up with Dr. Lamonte Sakai on 09/10/2021.  3.  History of vitamin D deficiency --okay to resume vitamin D3 2000 IU daily  4.  Epigastric pain/nausea/early satiety --all improved with PPI, we will continue omeprazole 40 mg once to twice daily  3 to 68-monthfollow-up with me  30 minutes total spent today including patient facing time, coordination of care, reviewing medical history/procedures/pertinent radiology studies, and documentation of the encounter.

## 2021-07-23 DIAGNOSIS — H04123 Dry eye syndrome of bilateral lacrimal glands: Secondary | ICD-10-CM | POA: Diagnosis not present

## 2021-07-23 DIAGNOSIS — Z9842 Cataract extraction status, left eye: Secondary | ICD-10-CM | POA: Diagnosis not present

## 2021-07-23 DIAGNOSIS — H5213 Myopia, bilateral: Secondary | ICD-10-CM | POA: Diagnosis not present

## 2021-07-23 DIAGNOSIS — D3131 Benign neoplasm of right choroid: Secondary | ICD-10-CM | POA: Diagnosis not present

## 2021-07-23 DIAGNOSIS — Z9841 Cataract extraction status, right eye: Secondary | ICD-10-CM | POA: Diagnosis not present

## 2021-07-25 ENCOUNTER — Other Ambulatory Visit: Payer: Self-pay | Admitting: Family Medicine

## 2021-07-27 ENCOUNTER — Other Ambulatory Visit: Payer: Self-pay

## 2021-07-27 DIAGNOSIS — M170 Bilateral primary osteoarthritis of knee: Secondary | ICD-10-CM | POA: Diagnosis not present

## 2021-07-27 DIAGNOSIS — N763 Subacute and chronic vulvitis: Secondary | ICD-10-CM | POA: Diagnosis not present

## 2021-07-27 MED ORDER — ATORVASTATIN CALCIUM 80 MG PO TABS: 80.0000 mg | ORAL_TABLET | Freq: Every day | ORAL | 2 refills | Status: DC

## 2021-07-28 NOTE — Addendum Note (Signed)
Addended by: Vanessa Barbara on: 07/28/2021 04:29 PM   Modules accepted: Orders

## 2021-07-29 DIAGNOSIS — K760 Fatty (change of) liver, not elsewhere classified: Secondary | ICD-10-CM | POA: Diagnosis not present

## 2021-08-02 DIAGNOSIS — G4733 Obstructive sleep apnea (adult) (pediatric): Secondary | ICD-10-CM | POA: Diagnosis not present

## 2021-08-03 DIAGNOSIS — M170 Bilateral primary osteoarthritis of knee: Secondary | ICD-10-CM | POA: Diagnosis not present

## 2021-08-12 DIAGNOSIS — N763 Subacute and chronic vulvitis: Secondary | ICD-10-CM | POA: Diagnosis not present

## 2021-08-13 DIAGNOSIS — M5126 Other intervertebral disc displacement, lumbar region: Secondary | ICD-10-CM | POA: Diagnosis not present

## 2021-08-13 DIAGNOSIS — M5416 Radiculopathy, lumbar region: Secondary | ICD-10-CM | POA: Diagnosis not present

## 2021-08-18 ENCOUNTER — Other Ambulatory Visit: Payer: PPO

## 2021-08-21 ENCOUNTER — Other Ambulatory Visit: Payer: PPO

## 2021-08-24 DIAGNOSIS — M5416 Radiculopathy, lumbar region: Secondary | ICD-10-CM | POA: Diagnosis not present

## 2021-09-01 ENCOUNTER — Other Ambulatory Visit: Payer: Self-pay

## 2021-09-01 ENCOUNTER — Ambulatory Visit
Admission: RE | Admit: 2021-09-01 | Discharge: 2021-09-01 | Disposition: A | Discharge status: Home / Self Care | Payer: PPO | Source: Ambulatory Visit | Attending: Adult Health | Admitting: Adult Health

## 2021-09-01 DIAGNOSIS — D869 Sarcoidosis, unspecified: Secondary | ICD-10-CM | POA: Diagnosis not present

## 2021-09-01 DIAGNOSIS — J984 Other disorders of lung: Secondary | ICD-10-CM | POA: Diagnosis not present

## 2021-09-01 DIAGNOSIS — I2510 Atherosclerotic heart disease of native coronary artery without angina pectoris: Secondary | ICD-10-CM | POA: Diagnosis not present

## 2021-09-01 DIAGNOSIS — R1013 Epigastric pain: Secondary | ICD-10-CM

## 2021-09-01 DIAGNOSIS — K449 Diaphragmatic hernia without obstruction or gangrene: Secondary | ICD-10-CM | POA: Diagnosis not present

## 2021-09-01 MED ORDER — IOPAMIDOL (ISOVUE-300) INJECTION 61%
75.0000 mL | Freq: Once | INTRAVENOUS | Status: AC | PRN
  Administered 2021-09-01: 75 mL via INTRAVENOUS

## 2021-09-02 DIAGNOSIS — G4733 Obstructive sleep apnea (adult) (pediatric): Secondary | ICD-10-CM | POA: Diagnosis not present

## 2021-09-02 NOTE — Progress Notes (Signed)
Patient requested that results of the chest CT be routed to the above providers for their review prior to her f/u with them.

## 2021-09-02 NOTE — Progress Notes (Signed)
Called and spoke with patient, advised of results/recommendations per Rexene Edison NP.  She verbalized understanding.  Results routed to her other providers per her request.

## 2021-09-10 ENCOUNTER — Ambulatory Visit (INDEPENDENT_AMBULATORY_CARE_PROVIDER_SITE_OTHER): Payer: PPO | Admitting: Emergency Medicine

## 2021-09-10 ENCOUNTER — Encounter: Payer: Self-pay | Admitting: Emergency Medicine

## 2021-09-10 ENCOUNTER — Other Ambulatory Visit: Payer: Self-pay

## 2021-09-10 DIAGNOSIS — E041 Nontoxic single thyroid nodule: Secondary | ICD-10-CM

## 2021-09-10 DIAGNOSIS — K219 Gastro-esophageal reflux disease without esophagitis: Secondary | ICD-10-CM

## 2021-09-10 DIAGNOSIS — R0602 Shortness of breath: Secondary | ICD-10-CM | POA: Diagnosis not present

## 2021-09-10 DIAGNOSIS — J4520 Mild intermittent asthma, uncomplicated: Secondary | ICD-10-CM

## 2021-09-10 DIAGNOSIS — D869 Sarcoidosis, unspecified: Secondary | ICD-10-CM

## 2021-09-10 DIAGNOSIS — J301 Allergic rhinitis due to pollen: Secondary | ICD-10-CM | POA: Diagnosis not present

## 2021-09-10 NOTE — Assessment & Plan Note (Signed)
Thyroid nodule noted on her CT chest.  She needs a thyroid ultrasound.  I will order this and then we will correlate the results and arrange next steps with Dr. Einar Pheasant.

## 2021-09-10 NOTE — Progress Notes (Addendum)
Subjective:    Patient ID: Hannah Vasquez, female    DOB: Dec 20, 1950, 71 y.o.   MRN: [id number]  Shortness of Breath Associated symptoms include ear pain and headaches. Pertinent negatives include no fever, leg swelling, rash, rhinorrhea, sore throat, vomiting or wheezing.   71 year-old never smoker with a history of hypertension, coronary artery disease status post MI 12/2016, mitral valve prolapse.    As part of her evaluation of dyspnea pulmonary function testing was done on 09/06/17 that I have reviewed.  This shows normal spirometry, no bronchodilator response.  Her lung volumes are normal.  Her diffusion capacity was slightly decreased but corrected to the normal range when adjusted for her alveolar volume. She notes that she was on pred for a rash at the time of this testing.   CXR 08/21/17 >> normal  ROV 04/10/18 --this is a follow-up visit for exertional shortness of breath.  She has stable coronary artery disease, mitral valve prolapse.  Her spirometry has been reassuring.  She is been participating in cardiopulmonary rehab and feels that she has improved significantly. She does a lot of walking, step machine, rowing machine. CPST was done and she had a severe HTN response - losartan was adjusted.   She is on omeprazole and GERD better controlled with this and diet. She is on loratadine qd and flonase prn.    ROV 09/10/21 --71 year old woman, never smoker with hypertension and associated cardiac limitation on CP ST, CAD, mitral valve prolapse, possible mild intermittent asthma although spirometry without clear obstruction.  She has been evaluated for hepatic steatosis and biopsy showed granulomas suggestive of hepatic sarcoidosis.  Elevated ACE level 06/11/2021 at 80  CT chest 09/01/2021 reviewed by me, shows no axillary, mediastinal or hilar adenopathy.  There is 1.5 cm heterogeneous thyroid nodule.  No focal consolidation.  There is some lingular and anterior left lower lobe groundglass  change, mild biapical thickening. On omeprazole twice daily, loratadine, fluticasone as needed She has albuterol which she uses very rarely.  Reports today that she hears some intermittent wheeze w exertion. She has some exertional SOB, happens with mild exertion. Can happen with conversation. She had gained 10-15lbs over 2 yrs. Denies any CP.    Review of Systems  Constitutional:  Negative for fever and unexpected weight change.  HENT:  Positive for congestion, dental problem and ear pain. Negative for nosebleeds, postnasal drip, rhinorrhea, sneezing, sore throat and trouble swallowing.   Eyes:  Positive for itching. Negative for redness.  Respiratory:  Positive for chest tightness and shortness of breath. Negative for cough and wheezing.   Cardiovascular:  Positive for palpitations. Negative for leg swelling.  Gastrointestinal:  Positive for nausea. Negative for vomiting.  Genitourinary:  Negative for dysuria.  Musculoskeletal:  Negative for joint swelling.  Skin:  Negative for rash.  Allergic/Immunologic: Negative.  Negative for environmental allergies, food allergies and immunocompromised state.  Neurological:  Positive for headaches.  Hematological:  Bruises/bleeds easily.  Psychiatric/Behavioral:  Negative for dysphoric mood. The patient is not nervous/anxious.    Past Medical History:  Diagnosis Date   Allergy    Anxiety    CAD (coronary artery disease)    Cataract    Does use hearing aid    bilateral as of 10/05/2019   Elevated alkaline phosphatase level    Hemorrhoids    Hepatic steatosis    History of MI (myocardial infarction) 10/07/2017   Hypertension    Mitral valve prolapse    Osteoporosis  Status post dilation of esophageal narrowing      Family History  Problem Relation Age of Onset   Mitral valve prolapse Mother    Hypertension Mother    Hypertension Father    Brain cancer Father    Colon cancer Neg Hx    Stomach cancer Neg Hx    Esophageal cancer Neg  Hx    Pancreatic cancer Neg Hx      Social History   Socioeconomic History   Marital status: Divorced    Spouse name: Not on file   Number of children: 2   Years of education: high school   Highest education level: Not on file  Occupational History   Occupation: Medical office---front desk    Comment: Retired  Tobacco Use   Smoking status: Never   Smokeless tobacco: Never  Vaping Use   Vaping Use: Never used  Substance and Sexual Activity   Alcohol use: No    Alcohol/week: 0.0 standard drinks   Drug use: No   Sexual activity: Not Currently    Birth control/protection: Post-menopausal  Other Topics Concern   Not on file  Social History Narrative   10/09/19   From: the area   Living: alone   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      Family: Product manager and Probation officer (nearby) - keeps in good touch with them, 3 grandchildren      Enjoys: walking, outdoor activities, family time      Exercise: mostly just walking around the house, in cardio rehab at [facility]   Diet: not great currently      Safety   Seat belts: Yes    Guns: No   Safe in relationships: Yes          Social Determinants of Radio broadcast assistant Strain: Low Risk    Difficulty of Paying Living Expenses: Not hard at all  Food Insecurity: No Food Insecurity   Worried About Charity fundraiser in the Last Year: Never true   [place] in the Last Year: Never true  Transportation Needs: No Transportation Needs   Lack of Transportation (Medical): No   Lack of Transportation (Non-Medical): No  Physical Activity: Insufficiently Active   Days of Exercise per Week: 2 days   Minutes of Exercise per Session: 50 min  Stress: No Stress Concern Present   Feeling of Stress : Not at all  Social Connections: Not on file  Intimate Partner Violence: Not At Risk   Fear of Current or Ex-Partner: No   Emotionally Abused: No   Physically Abused: No   Sexually Abused: No     Allergies   Allergen Reactions   Promethazine-Dm Nausea And Vomiting   Bactroban [Mupirocin Calcium]     Facial redness and swelling   Codeine Nausea And Vomiting   Valtrex [Valacyclovir Hcl] Swelling    Per pt causes redness   Penicillins Nausea And Vomiting and Rash     Outpatient Medications Prior to Visit  Medication Sig Dispense Refill   acetaminophen (TYLENOL) 500 MG tablet Take 1,000 mg by mouth every 6 (six) hours as needed for moderate pain.     albuterol (VENTOLIN HFA) 108 (90 Base) MCG/ACT inhaler Inhale 1 puff into the lungs every 6 (six) hours as needed for wheezing or shortness of breath. 18 g 1   amLODipine (NORVASC) 5 MG tablet TAKE 1 TABLET BY MOUTH ONCE A DAY 90 tablet 2   aspirin EC  81 MG tablet Take 81 mg by mouth every morning.      atorvastatin (LIPITOR) 80 MG tablet Take 1 tablet (80 mg total) by mouth daily at 6 PM. 90 tablet 2   Calcium-Magnesium-Vitamin D (CALCIUM 1200+D3 PO) Take 1,200 mg by mouth daily.     clobetasol ointment (TEMOVATE) 0.05 % clobetasol 0.05 % topical ointment  APPLY A THIN LAYER TO THE AFFECTED AREA(S) BY TOPICAL ROUTE 2 TIMES PER DAY     Cyanocobalamin (B-12 PO) Take 1 capsule by mouth daily.     fluticasone (FLONASE) 50 MCG/ACT nasal spray Place 1 spray into both nostrils daily as needed for allergies.     furosemide (LASIX) 20 MG tablet Take 1 tablet (20 mg total) by mouth daily as needed. 90 tablet 3   gabapentin (NEURONTIN) 300 MG capsule Take 1 capsule (300 mg total) by mouth 3 (three) times daily. 270 capsule 3   hydrochlorothiazide (HYDRODIURIL) 25 MG tablet TAKE 1 TABLET BY MOUTH ONCE A DAY 90 tablet 3   isosorbide mononitrate (IMDUR) 30 MG 24 hr tablet TAKE 1 TABLET BY MOUTH ONCE A DAY 90 tablet 1   loratadine (CLARITIN) 10 MG tablet Take 1 tablet (10 mg total) by mouth every morning. 30 tablet 2   losartan (COZAAR) 100 MG tablet TAKE 1 TABLET BY MOUTH ONCE A DAY 90 tablet 3   metoprolol succinate (TOPROL-XL) 25 MG 24 hr tablet TAKE 1 TABLET  BY MOUTH ONCE A DAY 90 tablet 1   Multiple Vitamin (MULTIVITAMIN) tablet Take 1 tablet by mouth every morning.      nitroGLYCERIN (NITROSTAT) 0.4 MG SL tablet Place 1 tablet (0.4 mg total) under the tongue every 5 (five) minutes as needed for chest pain. 25 tablet 3   Omega-3 Fatty Acids (FISH OIL) 1200 MG CAPS Take 1 capsule by mouth every morning.      omeprazole (PRILOSEC) 40 MG capsule TAKE 1 CAPSULE BY MOUTH TWICE DAILY 60 capsule 6   potassium chloride SA (KLOR-CON) 20 MEQ tablet TAKE 2 TABLETS BY MOUTH TWICE DAILY 360 tablet 3   traMADol (ULTRAM) 50 MG tablet Take 1 tablet (50 mg total) by mouth every 6 (six) hours as needed for severe pain. 90 tablet 5   ursodiol (ACTIGALL) 300 MG capsule Take by mouth.     vitamin E 180 MG (400 UNITS) capsule Take 800 Units by mouth in the morning and at bedtime.     No facility-administered medications prior to visit.        Objective:   Physical Exam Vitals:   09/10/21 0913  BP: 140/72  Pulse: 78  Temp: 98 F (36.7 C)  TempSrc: Oral  SpO2: 96%  Weight: 166 lb 3.2 oz (75.4 kg)  Height: 5' 2"  (1.575 m)   Gen: Pleasant, well-nourished, in no distress,  normal affect  ENT: No lesions,  mouth clear,  oropharynx clear, no postnasal drip  Neck: No JVD, no TMG, no carotid bruits  Lungs: No use of accessory muscles, clear B  Cardiovascular: RRR, distant, no murmur or gallops, no peripheral edema  Musculoskeletal: No deformities, no cyanosis or clubbing  Neuro: alert, non focal  Skin: Warm, no lesions or rash      Assessment & Plan:  Reactive airway disease Unclear diagnosis based on prior pulmonary function testing and cardiopulmonary exercise test.  We will learn more about this with repeat pulmonary function testing.  Keep albuterol available for now.  Allergic rhinitis Continue loratadine Use your fluticasone nasal spray  as needed for congestion and drainage   GERD (gastroesophageal reflux disease) Omeprazole as  ordered  Sarcoidosis Diagnosed by hepatic biopsy and elevated ACE level.  Reviewed her CT scan of the chest, possibly some subtle lingular and anterior left lower lobe groundglass but no evidence of active sarcoidosis, no scar, no mediastinal adenopathy.  I do not see any evidence of active pulmonary sarcoid.  She needs pulmonary function testing now to assess for obstructive lung disease and to establish baseline  Shortness of breath .  Likely multifactorial.  Agree with exercise, weight loss.  She is going to follow with Dr. Marlou Porch in cardiology to ensure cardiac status optimized.  I will perform pulmonary function testing, adjust BD if indicated.  Thyroid nodule Thyroid nodule noted on her CT chest.  She needs a thyroid ultrasound.  I will order this and then we will correlate the results and arrange next steps with Dr. Einar Pheasant.  Baltazar Apo, MD, PhD 09/10/2021, 9:49 AM [facility] [phone] or if no answer [phone]

## 2021-09-10 NOTE — Patient Instructions (Signed)
We will perform pulmonary function testing in next office visit. Keep your albuterol available to use 2 puffs if needed for shortness of breath, chest tightness, wheezing, coughing. Continue your omeprazole as you have been taking it Continue loratadine Use your fluticasone nasal spray as needed for congestion and drainage Agree with working on your exercise, conditioning and weight loss. Follow with Dr. Lamonte Sakai next available with full pulmonary function testing on the same day.

## 2021-09-10 NOTE — Assessment & Plan Note (Signed)
.    Likely multifactorial.  Agree with exercise, weight loss.  She is going to follow with Dr. Marlou Porch in cardiology to ensure cardiac status optimized.  I will perform pulmonary function testing, adjust BD if indicated.

## 2021-09-10 NOTE — Assessment & Plan Note (Signed)
Omeprazole as ordered

## 2021-09-10 NOTE — Addendum Note (Signed)
Addended by: Gavin Potters R on: 09/10/2021 09:53 AM   Modules accepted: Orders

## 2021-09-10 NOTE — Assessment & Plan Note (Signed)
Diagnosed by hepatic biopsy and elevated ACE level.  Reviewed her CT scan of the chest, possibly some subtle lingular and anterior left lower lobe groundglass but no evidence of active sarcoidosis, no scar, no mediastinal adenopathy.  I do not see any evidence of active pulmonary sarcoid.  She needs pulmonary function testing now to assess for obstructive lung disease and to establish baseline

## 2021-09-10 NOTE — Assessment & Plan Note (Signed)
Unclear diagnosis based on prior pulmonary function testing and cardiopulmonary exercise test.  We will learn more about this with repeat pulmonary function testing.  Keep albuterol available for now.

## 2021-09-10 NOTE — Assessment & Plan Note (Signed)
Continue loratadine Use your fluticasone nasal spray as needed for congestion and drainage

## 2021-09-15 ENCOUNTER — Ambulatory Visit
Admission: RE | Admit: 2021-09-15 | Discharge: 2021-09-15 | Disposition: A | Discharge status: Home / Self Care | Payer: PPO | Source: Ambulatory Visit | Attending: Emergency Medicine | Admitting: Emergency Medicine

## 2021-09-15 DIAGNOSIS — E041 Nontoxic single thyroid nodule: Secondary | ICD-10-CM

## 2021-09-16 ENCOUNTER — Telehealth: Payer: Self-pay | Admitting: Primary Care

## 2021-09-16 ENCOUNTER — Other Ambulatory Visit: Payer: Self-pay | Admitting: Primary Care

## 2021-09-16 NOTE — Telephone Encounter (Addendum)
°  Hannah Vasquez,  Please call patient and notify her that Dr. Lamonte Sakai contacted [place] about her thyroid nodule.  I reviewed her thyroid ultrasound and she needs to have a biopsy of the nodule for further evaluation.  If she willing to undergo a thyroid biopsy?  This is a minimally invasive procedure where they take a sample of the nodule which is located on her front neck.  Thanks! Allie Bossier, NP-C   ----- Message from Collene Gobble, MD sent at 09/16/2021  5:49 PM EST ----- Hi Dr Carlis Abbott,  I am following Dr Cody's patient LL.   She had a thyroid nodule seen on CT chest for sarcoid.  I ordered a thyroid US, and it recommends a needle bx. I was going to ask her to follow up with [facility] to continue the workup. Let me know if I can help   [facility]

## 2021-09-17 DIAGNOSIS — E041 Nontoxic single thyroid nodule: Secondary | ICD-10-CM

## 2021-09-17 NOTE — Telephone Encounter (Signed)
Left a VM for pt and also sent a mychart message.

## 2021-09-17 NOTE — Telephone Encounter (Signed)
Pt returned my call and says that she would like to talk things over with her daughters and then will call back on Monday to let us know how she would like to proceed. Pt may also want to talk with Dr. Einar Pheasant first, as well.

## 2021-09-18 NOTE — Telephone Encounter (Signed)
Noted. Will send this message to PCP who will return next week.

## 2021-09-21 NOTE — Telephone Encounter (Signed)
Pt called back she is willing to have the biopsy done and would like to have it done at [facility]. She will need this appt to be on a Tuesday morning

## 2021-09-21 NOTE — Telephone Encounter (Signed)
Order placed for [place] [facility]. Once they get the order they will call the patient and she can chose the day that works for her and the office doing the procedure

## 2021-09-22 DIAGNOSIS — M5416 Radiculopathy, lumbar region: Secondary | ICD-10-CM | POA: Diagnosis not present

## 2021-09-22 NOTE — Telephone Encounter (Signed)
Reviewed other message - pt scheduled for biopsy

## 2021-10-03 DIAGNOSIS — G4733 Obstructive sleep apnea (adult) (pediatric): Secondary | ICD-10-CM | POA: Diagnosis not present

## 2021-10-05 ENCOUNTER — Telehealth: Payer: Self-pay | Admitting: Internal Medicine

## 2021-10-05 NOTE — Telephone Encounter (Signed)
Per OV note in December pt was to follow up in 3-4 months. April is 4 mth so scheduled OV is fine.

## 2021-10-05 NOTE — Telephone Encounter (Signed)
Patient called stating she was supposed to follow up with Dr. Hilarie Vasquez around March 7 after she saw her liver specialist.  His first appointment was April 19, which was made for her.  Did you want to bring her in sooner?  If so, please call patient and advise.  Thank you.

## 2021-10-08 ENCOUNTER — Ambulatory Visit
Admission: RE | Admit: 2021-10-08 | Discharge: 2021-10-08 | Disposition: A | Discharge status: Home / Self Care | Payer: PPO | Source: Ambulatory Visit | Attending: Nurse Practitioner | Admitting: Nurse Practitioner

## 2021-10-08 ENCOUNTER — Other Ambulatory Visit (HOSPITAL_COMMUNITY)
Admission: RE | Admit: 2021-10-08 | Discharge: 2021-10-08 | Disposition: A | Discharge status: Home / Self Care | Payer: PPO | Source: Ambulatory Visit | Attending: Nurse Practitioner | Admitting: Nurse Practitioner

## 2021-10-08 DIAGNOSIS — E041 Nontoxic single thyroid nodule: Secondary | ICD-10-CM | POA: Insufficient documentation

## 2021-10-09 LAB — CYTOLOGY - NON PAP

## 2021-10-12 DIAGNOSIS — Z01419 Encounter for gynecological examination (general) (routine) without abnormal findings: Secondary | ICD-10-CM | POA: Diagnosis not present

## 2021-10-12 DIAGNOSIS — N952 Postmenopausal atrophic vaginitis: Secondary | ICD-10-CM | POA: Diagnosis not present

## 2021-10-12 DIAGNOSIS — N951 Menopausal and female climacteric states: Secondary | ICD-10-CM | POA: Diagnosis not present

## 2021-10-15 DIAGNOSIS — K7581 Nonalcoholic steatohepatitis (NASH): Secondary | ICD-10-CM | POA: Diagnosis not present

## 2021-10-15 DIAGNOSIS — K7402 Hepatic fibrosis, advanced fibrosis: Secondary | ICD-10-CM | POA: Diagnosis not present

## 2021-10-15 DIAGNOSIS — D8689 Sarcoidosis of other sites: Secondary | ICD-10-CM | POA: Diagnosis not present

## 2021-10-16 ENCOUNTER — Other Ambulatory Visit: Payer: Self-pay | Admitting: Nurse Practitioner

## 2021-10-16 DIAGNOSIS — K7402 Hepatic fibrosis, advanced fibrosis: Secondary | ICD-10-CM

## 2021-10-16 DIAGNOSIS — K753 Granulomatous hepatitis, not elsewhere classified: Secondary | ICD-10-CM

## 2021-10-16 DIAGNOSIS — K7581 Nonalcoholic steatohepatitis (NASH): Secondary | ICD-10-CM

## 2021-10-19 ENCOUNTER — Telehealth: Payer: Self-pay | Admitting: Emergency Medicine

## 2021-10-19 NOTE — Telephone Encounter (Signed)
Pt c/o left side neck and throat pain for the past few days  ?She had thyroid biopsy approx 2 weeks ago  ?She states sometimes the pain is radiating to her teeth and also down into left side of her chest  ?Sometimes feels hard to swallow ?She is not having any increased SOB, cough, fever  ?Has ov with PFT here on 10/21/21  ?She is asking if okay for this to wait until then  ?Please advise thanks! ?

## 2021-10-19 NOTE — Telephone Encounter (Signed)
Please let her know that I reviewed the biopsy results and the cytology is consistent with benign thyroid cyst. ?It concerns me that she is experiencing increased pain, difficulty swallowing this long after the biopsy.  I am okay with her waiting until 3/8 if symptoms are stable or improving but if they worsen in any way then she needs to be seen either by [place] or in the ED/urgent care. ?

## 2021-10-19 NOTE — Telephone Encounter (Signed)
Called and spoke with patient.  Dr. Lamonte Sakai results and recommendations given.  Understanding stated.  Nothing further at this time. ?

## 2021-10-20 ENCOUNTER — Telehealth: Payer: Self-pay | Admitting: Emergency Medicine

## 2021-10-20 ENCOUNTER — Encounter: Payer: Self-pay | Admitting: Emergency Medicine

## 2021-10-20 ENCOUNTER — Ambulatory Visit
Admission: EM | Admit: 2021-10-20 | Discharge: 2021-10-20 | Disposition: A | Discharge status: Home / Self Care | Payer: PPO | Attending: Emergency Medicine | Admitting: Emergency Medicine

## 2021-10-20 ENCOUNTER — Other Ambulatory Visit: Payer: Self-pay

## 2021-10-20 DIAGNOSIS — J029 Acute pharyngitis, unspecified: Secondary | ICD-10-CM | POA: Diagnosis not present

## 2021-10-20 LAB — POCT RAPID STREP A (OFFICE): Rapid Strep A Screen: NEGATIVE

## 2021-10-20 MED ORDER — LIDOCAINE VISCOUS HCL 2 % MT SOLN: 15.0000 mL | OROMUCOSAL | 0 refills | Status: DC | PRN

## 2021-10-20 NOTE — ED Triage Notes (Signed)
Pt here with sore throat on just the left side x 3 days. States that her face and eye hurts on that side. Pt had a thyroid biopsy on the left side a few weeks ago.  ?

## 2021-10-20 NOTE — ED Provider Notes (Signed)
Roderic Palau    CSN: [id number] Arrival date & time: 10/20/21  0802      History   Chief Complaint Chief Complaint  Patient presents with   Sore Throat    HPI Hannah Vasquez is a 71 y.o. female.  Patient presents with 3-4 day history of sore throat on the left side of her throat and neck.  No fever, chills, rash, cough, shortness of breath, or other symptoms.  Treatment at home with Tylenol.  Patient had thyroid biopsy on 10/08/2021; the thyroid nodule was benign.  Her medical history includes thyroid nodule, hypertension, NSTEMI, chronic heart failure, reactive airway disease, prediabetes.   The history is provided by the patient and medical records.   Past Medical History:  Diagnosis Date   Allergy    Anxiety    CAD (coronary artery disease)    Cataract    Does use hearing aid    bilateral as of 10/05/2019   Elevated alkaline phosphatase level    Hemorrhoids    Hepatic steatosis    History of MI (myocardial infarction) 10/07/2017   Hypertension    Mitral valve prolapse    Osteoporosis    Status post dilation of esophageal narrowing     Patient Active Problem List   Diagnosis Date Noted   Sarcoidosis 09/10/2021   Thyroid nodule 09/10/2021   Urinary urgency 05/21/2021   Vitamin B12 deficiency 01/29/2021   Cough 07/29/2020   OSA (obstructive sleep apnea) 06/25/2020   Early satiety 04/29/2020   Upper abdominal pain 04/29/2020   Nausea without vomiting 04/29/2020   Prediabetes 03/25/2020   Nocturia 03/25/2020   Insomnia 03/25/2020   Reactive airway disease 12/12/2019   Neuropathy 63/14/9702   Chronic systolic heart failure ([place]) 07/11/2019   Elevated serum alkaline phosphatase level 07/10/2019   Leukocytosis 07/10/2019   Bilateral hand pain 04/09/2019   Bilateral hearing loss 04/09/2019   Trigger finger, left middle finger 10/30/2018   Lumbar back pain with radiculopathy affecting lower extremity 05/22/2018   Osteopenia 05/15/2018   Lumbar  radiculopathy 04/27/2018   GERD (gastroesophageal reflux disease) 04/10/2018   Shortness of breath 08/21/2017   DDD (degenerative disc disease), thoracolumbar 04/28/2017   Back pain 04/21/2017   Hypokalemia 01/06/2017   History of non-ST elevation myocardial infarction (NSTEMI) 01/01/2017   Fatigue 11/27/2014   RBBB 12/19/2013   Mitral valve prolapse    HYPERTENSION, BENIGN 08/27/2010   Anxiety state 06/27/2009   Coronary atherosclerosis 06/27/2009   Mitral valve disorder 06/27/2009   HEMORRHOIDS 06/27/2009   Allergic rhinitis 06/27/2009   Disorder of bone and cartilage 11/11/2006    Past Surgical History:  Procedure Laterality Date   BASAL CELL CARCINOMA EXCISION  02/05/2016   Dr. Sarajane Jews, [facility]   BLADDER SURGERY  11/15/1995   bladder neck suspension, urinary incontinence   CARDIAC CATHETERIZATION  01/03/2017 DES LAD   CATARACT EXTRACTION Left 04/30/2016   Dr. Tommy Rainwater, [place] N/A 01/03/2017   Procedure: Coronary Stent Intervention;  Surgeon: Nelva Bush, MD;  Location: [place] [facility];  Service: Cardiovascular;  Laterality: N/A;   DENTAL SURGERY  06/20/2016   Tooth Implant; Dr. Kalman Shan   INTRAVASCULAR ULTRASOUND/IVUS N/A 01/03/2017   Procedure: Intravascular Ultrasound/IVUS;  Surgeon: Nelva Bush, MD;  Location: [place] [facility];  Service: Cardiovascular;  Laterality: N/A;   KNEE SURGERY  2003   torn meniscus   LEFT HEART CATH AND CORONARY ANGIOGRAPHY N/A 01/03/2017   Procedure: Left Heart Cath and  Coronary Angiography;  Surgeon: Nelva Bush, MD;  Location: [place] [facility];  Service: Cardiovascular;  Laterality: N/A;   MOHS SURGERY Right 2017   right side of face   ROTATOR CUFF REPAIR     SHOULDER SURGERY Right 2005    OB History     Gravida  2   Para  2   Term  2   Preterm      AB      Living  2      SAB      IAB      Ectopic      Multiple      Live Births  2             Home Medications    Prior to Admission medications   Medication Sig Start Date End Date Taking? Authorizing Provider  lidocaine (XYLOCAINE) 2 % solution Use as directed 15 mLs in the mouth or throat as needed for mouth pain. 10/20/21  Yes Sharion Balloon, NP  acetaminophen (TYLENOL) 500 MG tablet Take 1,000 mg by mouth every 6 (six) hours as needed for moderate pain.    [provider]  albuterol (VENTOLIN HFA) 108 (90 Base) MCG/ACT inhaler Inhale 1 puff into the lungs every 6 (six) hours as needed for wheezing or shortness of breath. 12/12/19   Lesleigh Noe, MD  amLODipine (NORVASC) 5 MG tablet TAKE 1 TABLET BY MOUTH ONCE A DAY 01/19/21   Lesleigh Noe, MD  aspirin EC 81 MG tablet Take 81 mg by mouth every morning.     [provider]  atorvastatin (LIPITOR) 80 MG tablet Take 1 tablet (80 mg total) by mouth daily at 6 PM. 07/27/21   Jerline Pain, MD  Calcium-Magnesium-Vitamin D (CALCIUM 1200+D3 PO) Take 1,200 mg by mouth daily.    [provider]  clobetasol ointment (TEMOVATE) 0.05 % clobetasol 0.05 % topical ointment  APPLY A THIN LAYER TO THE AFFECTED AREA(S) BY TOPICAL ROUTE 2 TIMES PER DAY 07/15/21   [provider]  Cyanocobalamin (B-12 PO) Take 1 capsule by mouth daily.    [provider]  fluticasone (FLONASE) 50 MCG/ACT nasal spray Place 1 spray into both nostrils daily as needed for allergies.    [provider]  furosemide (LASIX) 20 MG tablet Take 1 tablet (20 mg total) by mouth daily as needed. 01/01/19   Jerline Pain, MD  gabapentin (NEURONTIN) 300 MG capsule Take 1 capsule (300 mg total) by mouth 3 (three) times daily. 05/21/21   Lesleigh Noe, MD  hydrochlorothiazide (HYDRODIURIL) 25 MG tablet TAKE 1 TABLET BY MOUTH ONCE A DAY 07/27/21   Lesleigh Noe, MD  isosorbide mononitrate (IMDUR) 30 MG 24 hr tablet TAKE 1 TABLET BY MOUTH ONCE A DAY 07/22/21   Lesleigh Noe, MD  loratadine (CLARITIN) 10 MG tablet Take 1  tablet (10 mg total) by mouth every morning. 11/08/13   Lucille Passy, MD  losartan (COZAAR) 100 MG tablet TAKE 1 TABLET BY MOUTH ONCE A DAY 06/16/21   Jerline Pain, MD  metoprolol succinate (TOPROL-XL) 25 MG 24 hr tablet TAKE 1 TABLET BY MOUTH ONCE A DAY 07/22/21   Lesleigh Noe, MD  Multiple Vitamin (MULTIVITAMIN) tablet Take 1 tablet by mouth every morning.     [provider]  nitroGLYCERIN (NITROSTAT) 0.4 MG SL tablet Place 1 tablet (0.4 mg total) under the tongue every 5 (five) minutes as needed for chest pain.  06/23/21   Jerline Pain, MD  Omega-3 Fatty Acids (FISH OIL) 1200 MG CAPS Take 1 capsule by mouth every morning.     [provider]  omeprazole (PRILOSEC) 40 MG capsule TAKE 1 CAPSULE BY MOUTH TWICE DAILY 05/11/21   Zehr, Janett Billow D, PA-C  potassium chloride SA (KLOR-CON) 20 MEQ tablet TAKE 2 TABLETS BY MOUTH TWICE DAILY 02/13/21   Jerline Pain, MD  traMADol (ULTRAM) 50 MG tablet Take 1 tablet (50 mg total) by mouth every 6 (six) hours as needed for severe pain. 05/21/21   Lesleigh Noe, MD  ursodiol (ACTIGALL) 300 MG capsule Take by mouth. 07/17/21   [provider]  vitamin E 180 MG (400 UNITS) capsule Take 800 Units by mouth in the morning and at bedtime.    [provider]    Family History Family History  Problem Relation Age of Onset   Mitral valve prolapse Mother    Hypertension Mother    Hypertension Father    Brain cancer Father    Colon cancer Neg Hx    Stomach cancer Neg Hx    Esophageal cancer Neg Hx    Pancreatic cancer Neg Hx     Social History Social History   Tobacco Use   Smoking status: Never   Smokeless tobacco: Never  Vaping Use   Vaping Use: Never used  Substance Use Topics   Alcohol use: No    Alcohol/week: 0.0 standard drinks   Drug use: No     Allergies   Promethazine-dm, Bactroban [mupirocin calcium], Codeine, Valtrex [valacyclovir hcl], and Penicillins   Review of Systems Review of Systems   Constitutional:  Negative for chills and fever.  HENT:  Positive for sore throat. Negative for ear pain.   Respiratory:  Negative for cough and shortness of breath.   Cardiovascular:  Negative for chest pain and palpitations.  Gastrointestinal:  Negative for diarrhea and vomiting.  Skin:  Negative for color change and rash.  All other systems reviewed and are negative.   Physical Exam Triage Vital Signs ED Triage Vitals  Enc Vitals Group     BP      Pulse      Resp      Temp      Temp src      SpO2      Weight      Height      Head Circumference      Peak Flow      Pain Score      Pain Loc      Pain Edu?      Excl. in [place]?    No data found.  Updated Vital Signs BP (!) 148/73    Pulse 97    Temp 98.4 F (36.9 C)    Resp 18    LMP  (LMP Unknown)    SpO2 96%   Visual Acuity Right Eye Distance:   Left Eye Distance:   Bilateral Distance:    Right Eye Near:   Left Eye Near:    Bilateral Near:     Physical Exam Vitals and nursing note reviewed.  Constitutional:      General: She is not in acute distress.    Appearance: Normal appearance. She is well-developed. She is not ill-appearing.  HENT:     Right Ear: Tympanic membrane normal.     Left Ear: Tympanic membrane normal.     Nose: Nose normal.     Mouth/Throat:  Mouth: Mucous membranes are moist.     Pharynx: Posterior oropharyngeal erythema present.  Cardiovascular:     Rate and Rhythm: Normal rate and regular rhythm.     Heart sounds: No murmur heard. Pulmonary:     Effort: Pulmonary effort is normal. No respiratory distress.     Breath sounds: Normal breath sounds.  Musculoskeletal:     Cervical back: Neck supple.  Skin:    General: Skin is warm and dry.  Neurological:     Mental Status: She is alert.  Psychiatric:        Mood and Affect: Mood normal.        Behavior: Behavior normal.     UC Treatments / Results  Labs (all labs ordered are listed, but only abnormal results are  displayed) Labs Reviewed  NOVEL CORONAVIRUS, NAA  POCT RAPID STREP A (OFFICE)    EKG   Radiology No results found.  Procedures Procedures (including critical care time)  Medications Ordered in UC Medications - No data to display  Initial Impression / Assessment and Plan / UC Course  I have reviewed the triage vital signs and the nursing notes.  Pertinent labs & imaging results that were available during my care of the patient were reviewed by me and considered in my medical decision making (see chart for details).   Sore throat.  Rapid strep negative.  COVID pending.  Treating with viscous lidocaine, gargle and spit.  Discussed symptomatic treatment including Tylenol, rest, hydration.  Instructed patient to follow up with PCP if symptoms are not improving.  Patient agrees to plan of care.    Final Clinical Impressions(s) / UC Diagnoses   Final diagnoses:  Sore throat     Discharge Instructions      Your strep test is negative.  COVID is pending.    Use the viscous lidocaine as discussed.  Follow up with your primary care provider if your symptoms are not improving.        ED Prescriptions     Medication Sig Dispense Auth. Provider   lidocaine (XYLOCAINE) 2 % solution Use as directed 15 mLs in the mouth or throat as needed for mouth pain. 100 mL Sharion Balloon, NP      PDMP not reviewed this encounter.   Sharion Balloon, NP 10/20/21 [phone]

## 2021-10-20 NOTE — Discharge Instructions (Addendum)
Your strep test is negative.  COVID is pending.   ? ?Use the viscous lidocaine as discussed.  Follow up with your primary care provider if your symptoms are not improving.   ? ?

## 2021-10-21 ENCOUNTER — Ambulatory Visit: Payer: PPO | Admitting: Emergency Medicine

## 2021-10-21 LAB — NOVEL CORONAVIRUS, NAA: SARS-CoV-2, NAA: NOT DETECTED

## 2021-10-21 NOTE — Telephone Encounter (Signed)
Called and spoke with patient. She stated that she was seen by UC yesterday and is awaiting her covid test results. She went ahead and cancelled her PFT and OV that were scheduled for today (March 8th). She was able to get rescheduled for 04/14 for both the PFT and OV with RB. She wanted to see if we had any earlier openings for the same day. I advised her that we could move the PFT up to 10am but her OV would have to remain at 4pm since he did not have any more openings. She verbalized understanding and will keep the appt as they are.  ? ?Nothing further needed at time of call.  ?

## 2021-10-26 ENCOUNTER — Ambulatory Visit
Admission: RE | Admit: 2021-10-26 | Discharge: 2021-10-26 | Disposition: A | Discharge status: Home / Self Care | Payer: PPO | Source: Ambulatory Visit | Attending: Nurse Practitioner | Admitting: Nurse Practitioner

## 2021-10-26 DIAGNOSIS — K7581 Nonalcoholic steatohepatitis (NASH): Secondary | ICD-10-CM | POA: Diagnosis not present

## 2021-10-26 DIAGNOSIS — K753 Granulomatous hepatitis, not elsewhere classified: Secondary | ICD-10-CM

## 2021-10-26 DIAGNOSIS — K8020 Calculus of gallbladder without cholecystitis without obstruction: Secondary | ICD-10-CM | POA: Diagnosis not present

## 2021-10-26 DIAGNOSIS — K7402 Hepatic fibrosis, advanced fibrosis: Secondary | ICD-10-CM

## 2021-10-27 ENCOUNTER — Telehealth: Payer: Self-pay

## 2021-10-27 NOTE — Telephone Encounter (Signed)
[facility] - Client ?TELEPHONE ADVICE RECORD ?[facility]? ?Patient ?Name: ?Hannah Vasquez ?IS ?Gender: Female ?DOB: December 08, 1950 ?Age: 71 Y 30 D ?Return ?Phone ?Number: ?[id number] ?(Primary) ?Address: ?City/ ?State/ ?Zip: [place] ? [address] ?Client [facility] - Client ?Client Site [facility] - Day ?Provider Waunita Schooner- MD ?Contact Type Call ?Who Is Calling Patient / Member / Family / Caregiver ?Call Type Triage / Clinical ?Relationship To Patient Self ?Return Phone Number [phone] (Primary) ?Chief Complaint Dizziness ?Reason for Call Symptomatic / Request for Health Information ?Initial Comment Caller states she is light headed, dizzy and off ?balance. ?Translation No ?Nurse Assessment ?Nurse: Altamease Oiler, RN, [place] Date/Time (Eastern Time): 10/27/2021 2:24:39 PM ?Confirm and document reason for call. If ?symptomatic, describe symptoms. ?---pt reports lightheadedness, dizziness and feeling off ?balance. has been ongoing for months. ?Does the patient have any new or worsening ?symptoms? ---Yes ?Will a triage be completed? ---Yes ?Related visit to physician within the last 2 weeks? ---No ?Does the PT have any chronic conditions? (i.e. ?diabetes, asthma, this includes High risk factors for ?pregnancy, etc.) ?---Yes ?List chronic conditions. ---MI htn liver issues ?Is this a behavioral health or substance abuse call? ---No ?Guidelines ?Guideline Title Affirmed Question Affirmed Notes Nurse Date/Time (Eastern ?Time) ?Dizziness - ?Lightheadedness ?[1] MODERATE ?dizziness (e.g., ?interferes with ?normal activities) ?AND [2] has NOT ?been evaluated by ?physician for this ?(Exception: dizziness ?caused by heat ?exposure, sudden ?standing, or poor ?fluid intake) ?Altamease Oiler, RN, [place] 10/27/2021 2:29:53 ?PM ?PLEASE NOTE: All timestamps contained within this report are represented as [place] Standard Time. ?CONFIDENTIALTY NOTICE: This fax transmission  is intended only for the addressee. It contains information that is legally privileged, confidential or ?otherwise protected from use or disclosure. If you are not the intended recipient, you are strictly prohibited from reviewing, disclosing, copying using ?or disseminating any of this information or taking any action in reliance on or regarding this information. If you have received this fax in error, please ?notify us immediately by telephone so that we can arrange for its return to [place]. Phone: [phone], Toll-Free: [phone], Fax: [phone] ?Page: 2 of 2 ?Call Id: [id number] ?Disp. Time (Eastern ?Time) Disposition Final User ?10/27/2021 2:32:58 PM See PCP within 24 Hours Yes Altamease Oiler, RN, Fabio Bering ?Caller Disagree/Comply Comply ?Caller Understands Yes ?PreDisposition Call Doctor ?Care Advice Given Per Guideline ?SEE PCP WITHIN 24 HOURS: * IF OFFICE WILL BE OPEN: You need to be examined within the next 24 hours. Call your doctor ?(or NP/PA) when the office opens and make an appointment. DRINK FLUIDS: * Drink several glasses of fruit juice, other clear ?fluids or water. LIE DOWN AND REST: * Lie down with feet elevated for 1 hour. CALL BACK IF: * Passes out (faints) * You ?become worse CARE ADVICE given per Dizziness (Adult) guideline. ?Comments ?User: Kizzie Fantasia, RN Date/Time Eilene Ghazi Time): 10/27/2021 2:33:56 PM ?cold transfer to office ?Referrals ?REFERRED TO PCP OFFIC ?

## 2021-10-27 NOTE — Telephone Encounter (Signed)
I spoke with pt; pt said she was just calling for an appt. Pt said last time had any dizziness or lightheadedness was few days ago. No problem with dizziness or lightheadedness or feeling off balance today. Pt said no CP,SOB,H/A, dizziness or vision changes today. Pt said she has not cked BP since last doctors visit and that BP was not elevated and BP was fine. Pt cannot take BP now. Pt said her daughter already has scheduled her appt with Dr Einar Pheasant next wk 11/03/21 at 12:20. I offered sooner appt with a different provider and pt said she only wants to see Dr Einar Pheasant. Offered pt appt with Dr Einar Pheasant on 10/30/21 and pt said she will just keep appt her daughter made. UC & ED precautions given and pt voiced understanding. Sending note to Dr Einar Pheasant and [facility] CMA.  ?

## 2021-10-28 NOTE — Telephone Encounter (Signed)
Agree with plan and ER precautions if worsening ?

## 2021-10-31 DIAGNOSIS — G4733 Obstructive sleep apnea (adult) (pediatric): Secondary | ICD-10-CM | POA: Diagnosis not present

## 2021-11-03 ENCOUNTER — Encounter: Payer: Self-pay | Admitting: Family Medicine

## 2021-11-03 ENCOUNTER — Other Ambulatory Visit: Payer: Self-pay

## 2021-11-03 ENCOUNTER — Ambulatory Visit (INDEPENDENT_AMBULATORY_CARE_PROVIDER_SITE_OTHER): Payer: PPO | Admitting: Family Medicine

## 2021-11-03 VITALS — BP 110/62 | HR 64 | Temp 98.6°F | Ht 62.0 in | Wt 164.0 lb

## 2021-11-03 DIAGNOSIS — R748 Abnormal levels of other serum enzymes: Secondary | ICD-10-CM

## 2021-11-03 DIAGNOSIS — M5416 Radiculopathy, lumbar region: Secondary | ICD-10-CM

## 2021-11-03 DIAGNOSIS — R42 Dizziness and giddiness: Secondary | ICD-10-CM | POA: Insufficient documentation

## 2021-11-03 DIAGNOSIS — J0100 Acute maxillary sinusitis, unspecified: Secondary | ICD-10-CM

## 2021-11-03 DIAGNOSIS — E559 Vitamin D deficiency, unspecified: Secondary | ICD-10-CM | POA: Diagnosis not present

## 2021-11-03 DIAGNOSIS — R29898 Other symptoms and signs involving the musculoskeletal system: Secondary | ICD-10-CM

## 2021-11-03 LAB — CBC WITH DIFFERENTIAL/PLATELET
Basophils Absolute: 0.1 10*3/uL (ref 0.0–0.1)
Basophils Relative: 0.4 % (ref 0.0–3.0)
Eosinophils Absolute: 0.3 10*3/uL (ref 0.0–0.7)
Eosinophils Relative: 2.2 % (ref 0.0–5.0)
HCT: 38.5 % (ref 36.0–46.0)
Hemoglobin: 12.7 g/dL (ref 12.0–15.0)
Lymphocytes Relative: 31.2 % (ref 12.0–46.0)
Lymphs Abs: 4 10*3/uL (ref 0.7–4.0)
MCHC: 32.9 g/dL (ref 30.0–36.0)
MCV: 81.7 fl (ref 78.0–100.0)
Monocytes Absolute: 0.8 10*3/uL (ref 0.1–1.0)
Monocytes Relative: 6.5 % (ref 3.0–12.0)
Neutro Abs: 7.6 10*3/uL (ref 1.4–7.7)
Neutrophils Relative %: 59.7 % (ref 43.0–77.0)
Platelets: 367 10*3/uL (ref 150.0–400.0)
RBC: 4.71 Mil/uL (ref 3.87–5.11)
RDW: 15.3 % (ref 11.5–15.5)
WBC: 12.7 10*3/uL — ABNORMAL HIGH (ref 4.0–10.5)

## 2021-11-03 LAB — BASIC METABOLIC PANEL
BUN: 16 mg/dL (ref 6–23)
CO2: 29 mEq/L (ref 19–32)
Calcium: 9.9 mg/dL (ref 8.4–10.5)
Chloride: 102 mEq/L (ref 96–112)
Creatinine, Ser: 0.88 mg/dL (ref 0.40–1.20)
GFR: 65.35 mL/min (ref >60.00)
Glucose, Bld: 104 mg/dL — ABNORMAL HIGH (ref 70–99)
Potassium: 3.7 mEq/L (ref 3.5–5.1)
Sodium: 142 mEq/L (ref 135–145)

## 2021-11-03 LAB — TSH: TSH: 2.81 u[IU]/mL (ref 0.35–5.50)

## 2021-11-03 LAB — C-REACTIVE PROTEIN: CRP: 1.3 mg/dL (ref 0.5–20.0)

## 2021-11-03 LAB — FERRITIN: Ferritin: 58.1 ng/mL (ref 10.0–291.0)

## 2021-11-03 LAB — VITAMIN D 25 HYDROXY (VIT D DEFICIENCY, FRACTURES): VITD: 66.89 ng/mL (ref 30.00–100.00)

## 2021-11-03 LAB — VITAMIN B12: Vitamin B-12: 581 pg/mL (ref 211–911)

## 2021-11-03 LAB — SEDIMENTATION RATE: Sed Rate: 34 mm/hr — ABNORMAL HIGH (ref 0–30)

## 2021-11-03 MED ORDER — DOXYCYCLINE HYCLATE 100 MG PO TABS: 100.0000 mg | ORAL_TABLET | Freq: Two times a day (BID) | ORAL | 0 refills | Status: AC

## 2021-11-03 NOTE — Patient Instructions (Addendum)
Dizziness ?- MRI of the Brain ?- Neurology referral ?- Consider Stopping or holding - Tramadol and Gabapentin to see if this helps with the dizziness ?- would need to taper  ? ? ?Labs today ? ?Check with ortho on the hand ? ?Physical therapy referral  ? ?Jaw pain ?- will try medication for sinus infection ?

## 2021-11-03 NOTE — Progress Notes (Signed)
? ?Subjective:  ? ?  ?Hannah Vasquez is a 71 y.o. female presenting for Fatigue (And weakness increased over last 2 months ) ?  ? ? ?HPI ? ?#Fatigue ?- following with liver specialist and pulmonology ?- was sick a few weeks ago - negative for strept and covid ?- rescheduled her pulm testing because her throat was sore -  ?- PFTs rescheduled for April - has some trouble with breathing ?- endorses lightheadedness - cardiology work-up stable ?-- her dad had lightheaded symptoms and ended up with a cancer diagnosis - and brain tumor  ?- hx of vertigo - saw PT w/ improvement ?- since the biospy of the thyroid feels the left side of the neck has a constant low level pain ?- when she had the cold had left sided throat pain ?- was given lidocaine ?- URI symptoms with dental pain - improved ?- feels like the left side gets sensitive occasionally - 2 weeks was when UC happened ? ?#dizziness ?- will get symptoms at rest - sitting still  ?- in her head  ?- no change with position ?- will also impact her balance at certain times ?- hx of vertigo ?- no falls ?- on tramadol and gabapentin  ?- will feel dizziness in the morning - almost constant symptoms  ?- morning dizziness is the same as the throughout the day symptom ? ? ?#Back pain/sciatic ?- pain - back, legs - murphy weiner - getting back injections w/ improvement in her back and legs  ?- last night had burning/stinging in the toes - has an appointment in April  ?- on gabapentin 300 mg TID ?- injections use to last longer and now within 6 weeks symptoms seem to return ?- daughter is planning to attend that visit as well to see options ?- doing injections  ? ?Dropping things for a few months at least - went shopping and dropped a 12 pack of soda ? ?Review of Systems ? ? ?Social History  ? ?Tobacco Use  ?Smoking Status Never  ?Smokeless Tobacco Never  ? ? ? ?   ?Objective:  ?  ?BP Readings from Last 3 Encounters:  ?11/03/21 110/62  ?10/20/21 (!) 148/73  ?09/10/21 140/72  ? ?Wt  Readings from Last 3 Encounters:  ?11/03/21 164 lb (74.4 kg)  ?09/10/21 166 lb 3.2 oz (75.4 kg)  ?07/21/21 160 lb 3.2 oz (72.7 kg)  ? ? ?BP 110/62   Pulse 64   Temp 98.6 ?F (37 ?C) (Oral)   Ht 5' 2"  (1.575 m)   Wt 164 lb (74.4 kg)   LMP  (LMP Unknown)   SpO2 98%   BMI 30.00 kg/m?  ? ? ?Physical Exam ?Constitutional:   ?   General: She is not in acute distress. ?   Appearance: She is well-developed. She is not diaphoretic.  ?HENT:  ?   Head: Normocephalic and atraumatic.  ?   Right Ear: External ear normal.  ?   Left Ear: External ear normal.  ?   Nose: Nose normal.  ?Eyes:  ?   Conjunctiva/sclera: Conjunctivae normal.  ?Cardiovascular:  ?   Rate and Rhythm: Normal rate and regular rhythm.  ?Pulmonary:  ?   Effort: Pulmonary effort is normal. No respiratory distress.  ?   Breath sounds: Normal breath sounds. No wheezing.  ?Musculoskeletal:  ?   Cervical back: Neck supple.  ?   Comments: UE ?Shoulders 5/5 ?Tricep 5/5 ?Bicep 5/5 ?Wrist flexion 5/5 ?Wrist extension 5/5 ?Opposition of the thumb  5/5 ?Grip 4/5 (with left slightly worse than right)  ?Skin: ?   General: Skin is warm and dry.  ?   Capillary Refill: Capillary refill takes less than 2 seconds.  ?Neurological:  ?   Mental Status: She is alert. Mental status is at baseline.  ?Psychiatric:     ?   Mood and Affect: Mood normal.     ?   Behavior: Behavior normal.  ? ? ? ? ? ?   ?Assessment & Plan:  ? ?Problem List Items Addressed This Visit   ? ?  ? Nervous and Auditory  ? Lumbar radiculopathy  ?  Persistent symptoms, no longer responding to injections like she used to with her orthopedic doctor at [facility].  Discussed that I am concerned that some of her fatigue and dizziness may be secondary to her tramadol and gabapentin however with her pain with poor control I am worried that this may not be the best time to come off of her medications.  Referral to physical therapy to work on strength training and see if that helps reduce her pain.  Continue  follow-up with her orthopedic doctor.  Consider trial of twice daily pain medication. ?  ?  ? Relevant Orders  ? Ambulatory referral to Physical Therapy  ? ANA Screen,IFA,Reflex Titer/Pattern,Reflex Mplx 11 Ab Cascade with IdentRA  ?  ? Other  ? Dizziness, nonspecific - Primary  ?  No clear trigger does not seem consistent with orthostatic or vertigo based on her symptoms.  Recent normal stress test and echo.  She is due for PFTs with pulm but not having shortness of breath associated with this.  We will check labs today to rule out metabolic causes.  Given her vague symptoms we will also get MRI of the brain and refer to neurology as other work-up has been negative.  Long discussion that this could be medication side effect and need for possible coming off of her pain medicines.  Will discuss that in more depth once results come back. ?  ?  ? Relevant Orders  ? MR Brain Wo Contrast  ? Ambulatory referral to Neurology  ? CBC with Differential  ? Ferritin  ? TSH  ? Basic metabolic panel  ? ANA Screen,IFA,Reflex Titer/Pattern,Reflex Mplx 11 Ab Cascade with IdentRA  ? Decreased grip strength of left hand  ?  Weakness seems to be localized to the grip strength only, etiology unclear we will get some inflammatory markers.  Also advised that she discuss with her orthopedic doctor who is already following her for her lumbar spine issues.  To see if C-spine would be warranted. ?  ?  ? Relevant Orders  ? Ambulatory referral to Physical Therapy  ? C-reactive protein  ? Rheumatoid factor  ? Sedimentation Rate  ? ANA Screen,IFA,Reflex Titer/Pattern,Reflex Mplx 11 Ab Cascade with IdentRA  ? ?Other Visit Diagnoses   ? ? Elevated vitamin B12 level      ? Relevant Orders  ? Vitamin B12  ? Vitamin D deficiency      ? Relevant Orders  ? Vitamin D, 25-hydroxy  ? Acute non-recurrent maxillary sinusitis      ? Relevant Medications  ? doxycycline (VIBRA-TABS) 100 MG tablet  ? ?  ? ?I am not convinced she has a sinus infection however  with persistent left-sided face pain following URI we will treat with antibiotics to see if this resolves the symptom.  If the face pain does not improve will probably recommend ear  nose and throat evaluation. ? ?I spent >40 minutes with pt , obtaining history, examining, reviewing chart, documenting encounter and discussing the above plan of care. ? ? ?Return in about 6 weeks (around 12/15/2021). ? ?Lesleigh Noe, MD ? ?This visit occurred during the SARS-CoV-2 public health emergency.  Safety protocols were in place, including screening questions prior to the visit, additional usage of staff PPE, and extensive cleaning of exam room while observing appropriate contact time as indicated for disinfecting solutions.  ? ?

## 2021-11-03 NOTE — Assessment & Plan Note (Signed)
Weakness seems to be localized to the grip strength only, etiology unclear we will get some inflammatory markers.  Also advised that she discuss with her orthopedic doctor who is already following her for her lumbar spine issues.  To see if C-spine would be warranted. ?

## 2021-11-03 NOTE — Assessment & Plan Note (Signed)
No clear trigger does not seem consistent with orthostatic or vertigo based on her symptoms.  Recent normal stress test and echo.  She is due for PFTs with pulm but not having shortness of breath associated with this.  We will check labs today to rule out metabolic causes.  Given her vague symptoms we will also get MRI of the brain and refer to neurology as other work-up has been negative.  Long discussion that this could be medication side effect and need for possible coming off of her pain medicines.  Will discuss that in more depth once results come back. ?

## 2021-11-03 NOTE — Assessment & Plan Note (Signed)
Persistent symptoms, no longer responding to injections like she used to with her orthopedic doctor at [facility].  Discussed that I am concerned that some of her fatigue and dizziness may be secondary to her tramadol and gabapentin however with her pain with poor control I am worried that this may not be the best time to come off of her medications.  Referral to physical therapy to work on strength training and see if that helps reduce her pain.  Continue follow-up with her orthopedic doctor.  Consider trial of twice daily pain medication. ?

## 2021-11-04 ENCOUNTER — Encounter: Payer: Self-pay | Admitting: Neurology

## 2021-11-07 LAB — ANA SCREEN,IFA,REFLEX TITER/PATTERN,REFLEX MPLX 11 AB CASCADE
14-3-3 eta Protein: <0.2 ng/mL (ref <0.2)
Anti Nuclear Antibody (ANA): NEGATIVE
Cyclic Citrullin Peptide Ab: <16 UNITS
Rheumatoid fact SerPl-aCnc: <14 IU/mL (ref <14)

## 2021-11-09 ENCOUNTER — Encounter: Payer: Self-pay | Admitting: *Deleted

## 2021-11-09 ENCOUNTER — Telehealth: Payer: Self-pay | Admitting: Family Medicine

## 2021-11-09 DIAGNOSIS — R519 Headache, unspecified: Secondary | ICD-10-CM

## 2021-11-09 DIAGNOSIS — R42 Dizziness and giddiness: Secondary | ICD-10-CM

## 2021-11-09 NOTE — Telephone Encounter (Signed)
Has her face pain shown any change or improvement?  ? ?She was on antibiotics for possible sinus infection. If there was no change, I would recommend stopping the medication and we can have her see ENT for the face pain ?

## 2021-11-09 NOTE — Telephone Encounter (Signed)
Spoke to pt and she states that there was no improvement in the pain of her face. Advised pt to stop antibiotic, per Dr. Einar Pheasant, and that a referral will be sent to ENT.  ?Pt states that she saw her dentist and they did xrays, to rule out anything dental related, and dentist did not see anything that they could treat.  ?

## 2021-11-09 NOTE — Telephone Encounter (Signed)
Pt called stating that medication doxycycline (VIBRA-TABS) 100 MG tablet is making her throw up. Pt stated that she took it for 2 days and on the 3rd day it made her throw up. Please advise. ?

## 2021-11-09 NOTE — Addendum Note (Signed)
Addended by: Lesleigh Noe on: 11/09/2021 05:29 PM ? ? Modules accepted: Orders ? ?

## 2021-11-10 ENCOUNTER — Ambulatory Visit
Admission: RE | Admit: 2021-11-10 | Discharge: 2021-11-10 | Disposition: A | Discharge status: Home / Self Care | Payer: PPO | Source: Ambulatory Visit | Attending: Family Medicine | Admitting: Family Medicine

## 2021-11-10 ENCOUNTER — Other Ambulatory Visit: Payer: Self-pay

## 2021-11-10 DIAGNOSIS — I6782 Cerebral ischemia: Secondary | ICD-10-CM | POA: Diagnosis not present

## 2021-11-10 DIAGNOSIS — R42 Dizziness and giddiness: Secondary | ICD-10-CM | POA: Diagnosis not present

## 2021-11-12 ENCOUNTER — Encounter: Payer: Self-pay | Admitting: *Deleted

## 2021-11-12 ENCOUNTER — Ambulatory Visit: Payer: PPO

## 2021-11-13 ENCOUNTER — Ambulatory Visit: Payer: PPO | Admitting: Neurology

## 2021-11-13 ENCOUNTER — Encounter: Payer: Self-pay | Admitting: Neurology

## 2021-11-13 VITALS — BP 138/65 | HR 75 | Ht 62.0 in | Wt 164.0 lb

## 2021-11-13 DIAGNOSIS — R42 Dizziness and giddiness: Secondary | ICD-10-CM | POA: Diagnosis not present

## 2021-11-13 DIAGNOSIS — I951 Orthostatic hypotension: Secondary | ICD-10-CM | POA: Diagnosis not present

## 2021-11-13 NOTE — Patient Instructions (Signed)
Good to meet you. Your neurological exam and brain MRI are normal, no clear neurological cause of your symptoms. Please discuss your CPAP difficulties with your doctor and see if using it more consistently helps with symptoms. Start checking BP regularly. Follow-up as needed, call for any changes. ?

## 2021-11-13 NOTE — Progress Notes (Signed)
? ?NEUROLOGY CONSULTATION NOTE ? ?[name] ?MRN: [id number] ?DOB: 1951-01-05 ? ?Referring provider: Dr. Waunita Schooner ?Primary care provider: Dr. Waunita Schooner ? ?Reason for consult:  dizziness ? ?Dear Dr Einar Pheasant: ? ?Thank you for your kind referral of Hannah Vasquez for consultation of the above symptoms. Although her history is well known to you, please allow me to reiterate it for the purpose of our medical record. The patient was accompanied to the clinic by her daughter Duwaine Maxin who also provides collateral information. Records and images were personally reviewed where available. ? ? ?HISTORY OF PRESENT ILLNESS: ?This is a 71 year old right-handed woman with a history of hypertension, hyperlipidemia, NASH, chronic back pain, vertigo,presenting for evaluation of dizziness. Dizziness started gradually around 6 months ago where she feels "like something going around in my head." She denies any spinning sensation. It is almost like she can feel it above her eyes and feels it going through with a slight headache. She reports a constant dizziness with mild headache, the left side of the head hurts more. No clear triggers. She feels the dizziness when sitting, standing, it does not improve when she lies down. She however reported feeling more dizziness during exam today from sitting to standing. She does not feel like she will pass out. She feels nauseated most of the time. She vomited the other day but this was felt due to antibiotic that she has stince stopped. She had neck/jaw pain 3 weeks ago, the left side of her throat was red. Her jaw hurt to eat. This is just now beginning to go away. She has a little problem swallowing, especially bigger pills. She feels her vision is blurred "very little." She has chronic tingling and burning in her feet and toes and gets injections in her back. She has been on the same dose of gabapentin for 2-3 years. She has occasional numbness in her hands. She has some constipation and saw  Urology for urinary urgency. She denies any tremors. She denies any falls or head injuries. No family history of similar symptoms. She does not sleep well with interrupted sleep every few hours. She has had a CPAP machine for about a year but does not use it consistently due to issues with her mask. Lately she has been using it for 5 hours then takes it off.  ? ?I personally reviewed MRI brain without contrast done 11/10/2021 which did not show any acute changes, there was minimal chronic microvascular disease. Images reviewed with them today.  ? ? ?PAST MEDICAL HISTORY: ?Past Medical History:  ?Diagnosis Date  ? Allergy   ? Anxiety   ? CAD (coronary artery disease)   ? Cataract   ? Does use hearing aid   ? bilateral as of 10/05/2019  ? Elevated alkaline phosphatase level   ? Hemorrhoids   ? Hepatic steatosis   ? History of MI (myocardial infarction) 10/07/2017  ? Hypertension   ? Mitral valve prolapse   ? Osteoporosis   ? Status post dilation of esophageal narrowing   ? ? ?PAST SURGICAL HISTORY: ?Past Surgical History:  ?Procedure Laterality Date  ? BASAL CELL CARCINOMA EXCISION  02/05/2016  ? Dr. Sarajane Jews, [facility]  ? BLADDER SURGERY  11/15/1995  ? bladder neck suspension, urinary incontinence  ? CARDIAC CATHETERIZATION  01/03/2017 DES LAD  ? CATARACT EXTRACTION Left 04/30/2016  ? Dr. Tommy Rainwater, [place]  ? CORONARY STENT INTERVENTION N/A 01/03/2017  ? Procedure: Coronary Stent Intervention;  Surgeon: Nelva Bush, MD;  Location: [place] [facility];  Service: Cardiovascular;  Laterality: N/A;  ? DENTAL SURGERY  06/20/2016  ? Tooth Implant; Dr. Kalman Shan  ? INTRAVASCULAR ULTRASOUND/IVUS N/A 01/03/2017  ? Procedure: Intravascular Ultrasound/IVUS;  Surgeon: Nelva Bush, MD;  Location: [place] [facility];  Service: Cardiovascular;  Laterality: N/A;  ? KNEE SURGERY  2003  ? torn meniscus  ? LEFT HEART CATH AND CORONARY ANGIOGRAPHY N/A 01/03/2017  ? Procedure: Left Heart Cath and Coronary Angiography;   Surgeon: Nelva Bush, MD;  Location: [place] [facility];  Service: Cardiovascular;  Laterality: N/A;  ? MOHS SURGERY Right 2017  ? right side of face  ? ROTATOR CUFF REPAIR    ? SHOULDER SURGERY Right 2005  ? ? ?MEDICATIONS: ?Current Outpatient Medications on File Prior to Visit  ?Medication Sig Dispense Refill  ? acetaminophen (TYLENOL) 500 MG tablet Take 1,000 mg by mouth every 6 (six) hours as needed for moderate pain.    ? albuterol (VENTOLIN HFA) 108 (90 Base) MCG/ACT inhaler Inhale 1 puff into the lungs every 6 (six) hours as needed for wheezing or shortness of breath. 18 g 1  ? amLODipine (NORVASC) 5 MG tablet TAKE 1 TABLET BY MOUTH ONCE A DAY 90 tablet 2  ? aspirin EC 81 MG tablet Take 81 mg by mouth every morning.     ? atorvastatin (LIPITOR) 80 MG tablet Take 1 tablet (80 mg total) by mouth daily at 6 PM. 90 tablet 2  ? Calcium-Magnesium-Vitamin D (CALCIUM 1200+D3 PO) Take 1,200 mg by mouth daily.    ? clobetasol ointment (TEMOVATE) 0.05 % clobetasol 0.05 % topical ointment ? APPLY A THIN LAYER TO THE AFFECTED AREA(S) BY TOPICAL ROUTE 2 TIMES PER DAY    ? fluticasone (FLONASE) 50 MCG/ACT nasal spray Place 1 spray into both nostrils daily as needed for allergies.    ? furosemide (LASIX) 20 MG tablet Take 1 tablet (20 mg total) by mouth daily as needed. 90 tablet 3  ? gabapentin (NEURONTIN) 300 MG capsule Take 1 capsule (300 mg total) by mouth 3 (three) times daily. 270 capsule 3  ? hydrochlorothiazide (HYDRODIURIL) 25 MG tablet TAKE 1 TABLET BY MOUTH ONCE A DAY 90 tablet 3  ? isosorbide mononitrate (IMDUR) 30 MG 24 hr tablet TAKE 1 TABLET BY MOUTH ONCE A DAY 90 tablet 1  ? lidocaine (XYLOCAINE) 2 % solution Use as directed 15 mLs in the mouth or throat as needed for mouth pain. 100 mL 0  ? loratadine (CLARITIN) 10 MG tablet Take 1 tablet (10 mg total) by mouth every morning. 30 tablet 2  ? losartan (COZAAR) 100 MG tablet TAKE 1 TABLET BY MOUTH ONCE A DAY 90 tablet 3  ? metoprolol succinate (TOPROL-XL)  25 MG 24 hr tablet TAKE 1 TABLET BY MOUTH ONCE A DAY 90 tablet 1  ? Multiple Vitamin (MULTIVITAMIN) tablet Take 1 tablet by mouth every morning.     ? MYRBETRIQ 50 MG TB24 tablet Take 50 mg by mouth daily.    ? nitroGLYCERIN (NITROSTAT) 0.4 MG SL tablet Place 1 tablet (0.4 mg total) under the tongue every 5 (five) minutes as needed for chest pain. 25 tablet 3  ? Omega-3 Fatty Acids (FISH OIL) 1200 MG CAPS Take 1 capsule by mouth every morning.     ? omeprazole (PRILOSEC) 40 MG capsule TAKE 1 CAPSULE BY MOUTH TWICE DAILY 60 capsule 6  ? potassium chloride SA (KLOR-CON) 20 MEQ tablet TAKE 2 TABLETS BY MOUTH TWICE DAILY 360 tablet 3  ?  traMADol (ULTRAM) 50 MG tablet Take 1 tablet (50 mg total) by mouth every 6 (six) hours as needed for severe pain. 90 tablet 5  ? ursodiol (ACTIGALL) 300 MG capsule Take by mouth.    ? vitamin E 180 MG (400 UNITS) capsule Take 800 Units by mouth in the morning and at bedtime.    ? ?No current facility-administered medications on file prior to visit.  ? ? ?ALLERGIES: ?Allergies  ?Allergen Reactions  ? Promethazine-Dm Nausea And Vomiting  ? Bactroban [Mupirocin Calcium]   ?  Facial redness and swelling  ? Codeine Nausea And Vomiting  ? Valtrex [Valacyclovir Hcl] Swelling  ?  Per pt causes redness  ? Penicillins Nausea And Vomiting and Rash  ? ? ?FAMILY HISTORY: ?Family History  ?Problem Relation Age of Onset  ? Mitral valve prolapse Mother   ? Hypertension Mother   ? Hypertension Father   ? Brain cancer Father   ? Colon cancer Neg Hx   ? Stomach cancer Neg Hx   ? Esophageal cancer Neg Hx   ? Pancreatic cancer Neg Hx   ? ? ?SOCIAL HISTORY: ?Social History  ? ?Socioeconomic History  ? Marital status: Divorced  ?  Spouse name: Not on file  ? Number of children: 2  ? Years of education: high school  ? Highest education level: Not on file  ?Occupational History  ? Occupation: Medical office---front desk  ?  Comment: Retired  ?Tobacco Use  ? Smoking status: Never  ? Smokeless tobacco: Never   ?Vaping Use  ? Vaping Use: Never used  ?Substance and Sexual Activity  ? Alcohol use: No  ?  Alcohol/week: 0.0 standard drinks  ? Drug use: No  ? Sexual activity: Not Currently  ?  Birth control/protectio

## 2021-11-16 ENCOUNTER — Telehealth: Payer: Self-pay | Admitting: Cardiology

## 2021-11-16 ENCOUNTER — Emergency Department (HOSPITAL_COMMUNITY): Payer: PPO

## 2021-11-16 ENCOUNTER — Other Ambulatory Visit: Payer: Self-pay

## 2021-11-16 ENCOUNTER — Emergency Department (HOSPITAL_COMMUNITY)
Admission: EM | Admit: 2021-11-16 | Discharge: 2021-11-16 | Disposition: A | Discharge status: Home / Self Care | Payer: PPO | Attending: Emergency Medicine | Admitting: Emergency Medicine

## 2021-11-16 ENCOUNTER — Encounter (HOSPITAL_COMMUNITY): Payer: Self-pay | Admitting: *Deleted

## 2021-11-16 DIAGNOSIS — R0602 Shortness of breath: Secondary | ICD-10-CM | POA: Diagnosis not present

## 2021-11-16 DIAGNOSIS — R079 Chest pain, unspecified: Secondary | ICD-10-CM | POA: Diagnosis not present

## 2021-11-16 DIAGNOSIS — Z79899 Other long term (current) drug therapy: Secondary | ICD-10-CM | POA: Diagnosis not present

## 2021-11-16 DIAGNOSIS — D72829 Elevated white blood cell count, unspecified: Secondary | ICD-10-CM | POA: Insufficient documentation

## 2021-11-16 DIAGNOSIS — I2510 Atherosclerotic heart disease of native coronary artery without angina pectoris: Secondary | ICD-10-CM | POA: Diagnosis not present

## 2021-11-16 DIAGNOSIS — M542 Cervicalgia: Secondary | ICD-10-CM

## 2021-11-16 DIAGNOSIS — I10 Essential (primary) hypertension: Secondary | ICD-10-CM | POA: Diagnosis not present

## 2021-11-16 DIAGNOSIS — Z7982 Long term (current) use of aspirin: Secondary | ICD-10-CM | POA: Insufficient documentation

## 2021-11-16 DIAGNOSIS — I6522 Occlusion and stenosis of left carotid artery: Secondary | ICD-10-CM

## 2021-11-16 DIAGNOSIS — R110 Nausea: Secondary | ICD-10-CM | POA: Diagnosis not present

## 2021-11-16 DIAGNOSIS — J029 Acute pharyngitis, unspecified: Secondary | ICD-10-CM | POA: Diagnosis not present

## 2021-11-16 DIAGNOSIS — R42 Dizziness and giddiness: Secondary | ICD-10-CM | POA: Diagnosis not present

## 2021-11-16 DIAGNOSIS — R519 Headache, unspecified: Secondary | ICD-10-CM

## 2021-11-16 DIAGNOSIS — E876 Hypokalemia: Secondary | ICD-10-CM | POA: Diagnosis not present

## 2021-11-16 DIAGNOSIS — R0789 Other chest pain: Secondary | ICD-10-CM | POA: Diagnosis not present

## 2021-11-16 LAB — CBC WITH DIFFERENTIAL/PLATELET
Abs Immature Granulocytes: 0.03 10*3/uL (ref 0.00–0.07)
Basophils Absolute: 0.1 10*3/uL (ref 0.0–0.1)
Basophils Relative: 1 %
Eosinophils Absolute: 0.3 10*3/uL (ref 0.0–0.5)
Eosinophils Relative: 3 %
HCT: 40 % (ref 36.0–46.0)
Hemoglobin: 13 g/dL (ref 12.0–15.0)
Immature Granulocytes: 0 %
Lymphocytes Relative: 29 %
Lymphs Abs: 3.3 10*3/uL (ref 0.7–4.0)
MCH: 27.1 pg (ref 26.0–34.0)
MCHC: 32.5 g/dL (ref 30.0–36.0)
MCV: 83.5 fL (ref 80.0–100.0)
Monocytes Absolute: 0.8 10*3/uL (ref 0.1–1.0)
Monocytes Relative: 7 %
Neutro Abs: 6.9 10*3/uL (ref 1.7–7.7)
Neutrophils Relative %: 60 %
Platelets: 324 10*3/uL (ref 150–400)
RBC: 4.79 MIL/uL (ref 3.87–5.11)
RDW: 13.9 % (ref 11.5–15.5)
WBC: 11.4 10*3/uL — ABNORMAL HIGH (ref 4.0–10.5)
nRBC: 0 % (ref 0.0–0.2)

## 2021-11-16 LAB — HEPATIC FUNCTION PANEL
ALT: 16 U/L (ref 0–44)
AST: 22 U/L (ref 15–41)
Albumin: 3.2 g/dL — ABNORMAL LOW (ref 3.5–5.0)
Alkaline Phosphatase: 125 U/L (ref 38–126)
Bilirubin, Direct: <0.1 mg/dL (ref 0.0–0.2)
Total Bilirubin: 0.5 mg/dL (ref 0.3–1.2)
Total Protein: 6.6 g/dL (ref 6.5–8.1)

## 2021-11-16 LAB — BASIC METABOLIC PANEL
Anion gap: 7 (ref 5–15)
BUN: 11 mg/dL (ref 8–23)
CO2: 25 mmol/L (ref 22–32)
Calcium: 9.4 mg/dL (ref 8.9–10.3)
Chloride: 106 mmol/L (ref 98–111)
Creatinine, Ser: 0.97 mg/dL (ref 0.44–1.00)
GFR, Estimated: >60 mL/min (ref >60)
Glucose, Bld: 114 mg/dL — ABNORMAL HIGH (ref 70–99)
Potassium: 3.2 mmol/L — ABNORMAL LOW (ref 3.5–5.1)
Sodium: 138 mmol/L (ref 135–145)

## 2021-11-16 LAB — CBG MONITORING, ED: Glucose-Capillary: 110 mg/dL — ABNORMAL HIGH (ref 70–99)

## 2021-11-16 LAB — TROPONIN I (HIGH SENSITIVITY)
Troponin I (High Sensitivity): 4 ng/L (ref <18)
Troponin I (High Sensitivity): 4 ng/L (ref <18)

## 2021-11-16 LAB — LIPASE, BLOOD: Lipase: 35 U/L (ref 11–51)

## 2021-11-16 MED ORDER — ASPIRIN 81 MG PO CHEW
324.0000 mg | CHEWABLE_TABLET | Freq: Once | ORAL | Status: AC
  Administered 2021-11-16: 324 mg via ORAL
  Filled 2021-11-16: qty 4

## 2021-11-16 MED ORDER — IOHEXOL 350 MG/ML SOLN
140.0000 mL | Freq: Once | INTRAVENOUS | Status: AC | PRN
  Administered 2021-11-16: 140 mL via INTRAVENOUS

## 2021-11-16 NOTE — ED Triage Notes (Signed)
Pt reports onset Saturday of left side neck pain down into her left chest. Has nausea, headache and dizziness. Reports cardiac hx.  ?

## 2021-11-16 NOTE — Telephone Encounter (Signed)
Spoke to the patient's daughter Lovey Newcomer [facility]) about discharge from the hospital. She had questions about the CT angio head neck. Answered any questions that I could. Forwarded those results to the PCP and Neuro. Will sent message to MD [facility] as Juluis Rainier.   ?

## 2021-11-16 NOTE — ED Provider Triage Note (Signed)
Emergency Medicine Provider Triage Evaluation Note ? ?Hannah Vasquez , a 71 y.o. female  was evaluated in triage.  Pt complains of left-sided neck pain radiating into her left collarbone and into her jaw that has been ongoing for about 2 days.  Patient also endorses headache, intermittent dizziness and some nausea.  Patient has cardiac disease to include NSTEMI with two-vessel CAD s/p stent placed in 2018.  She called her cardiologist Dr. Marlou Porch early this morning with her symptoms who referred her emergently to the ED. ? ?Review of Systems  ?Positive: Left neck and jaw pain, headache, dizziness and nausea ?Negative: Abdominal pain ? ?Physical Exam  ?BP (!) 141/69 (BP Location: Left Arm)   Pulse 76   Temp 98.2 ?F (36.8 ?C) (Oral)   Resp 18   LMP  (LMP Unknown)   SpO2 94%  ?Gen:   Awake, no distress   ?Resp:  Normal effort  ?MSK:   Moves extremities without difficulty  ?Other:   ? ?Medical Decision Making  ?Medically screening exam initiated at 10:50 AM.  Appropriate orders placed.  Hannah Vasquez was informed that the remainder of the evaluation will be completed by another provider, this initial triage assessment does not replace that evaluation, and the importance of remaining in the ED until their evaluation is complete. ? ? ?  ?Tonye Pearson, [place] ?11/16/21 1053 ? ?

## 2021-11-16 NOTE — ED Provider Notes (Signed)
?[facility] ?Provider Note ? ? ?CSN: [id number] ?Arrival date & time: 11/16/21  1001 ? ?  ? ?History ? ?Chief Complaint  ?Patient presents with  ? Dizziness  ? Chest Pain  ? ? ?Hannah Vasquez is a 71 y.o. female. ? ?HPI ?71 year old female history of coronary artery disease, hypertension, presents today complaining of left-sided headache with pain radiating to left chest.  She states she has had a headache intermittently over the past month.  She was seen and evaluated for a sore throat.  She states that she then began having a headache.  She reports being sent to a neurologist and having an MRI of her brain.  She complained of dizziness at that time.  MRI of brain obtained March 28 shows no evidence of acute intracranial abnormality minimal chronic small vessel ischemic changes otherwise unremarkable noncontrast MRI.  Reports that she has had ongoing dizziness, continues to have headache, and now has pain radiating to the left side of her chest for the past several days.  She describes the pain as 7 out of 10 and it is pressure-like and somewhat similar to prior cardiac pain.  Dates that she came in today because her daughter insisted.  Her daughter is at the bedside. ? ?  ? ?Home Medications ?Prior to Admission medications   ?Medication Sig Start Date End Date Taking? Authorizing Provider  ?acetaminophen (TYLENOL) 500 MG tablet Take 500 mg by mouth every 6 (six) hours as needed for moderate pain.   Yes [provider]  ?albuterol (VENTOLIN HFA) 108 (90 Base) MCG/ACT inhaler Inhale 1 puff into the lungs every 6 (six) hours as needed for wheezing or shortness of breath. 12/12/19  Yes Lesleigh Noe, MD  ?amLODipine (NORVASC) 5 MG tablet TAKE 1 TABLET BY MOUTH ONCE A DAY ?Patient taking differently: Take 5 mg by mouth daily. 01/19/21  Yes Lesleigh Noe, MD  ?aspirin EC 81 MG tablet Take 81 mg by mouth every morning.    Yes [provider]  ?atorvastatin (LIPITOR)  80 MG tablet Take 1 tablet (80 mg total) by mouth daily at 6 PM. 07/27/21  Yes Jerline Pain, MD  ?Calcium-Magnesium-Vitamin D (CALCIUM 1200+D3 PO) Take 1,200 mg by mouth daily.   Yes [provider]  ?fluticasone (FLONASE) 50 MCG/ACT nasal spray Place 1 spray into both nostrils daily as needed for allergies.   Yes [provider]  ?furosemide (LASIX) 20 MG tablet Take 1 tablet (20 mg total) by mouth daily as needed. ?Patient taking differently: Take 20 mg by mouth daily as needed for edema. 01/01/19  Yes Jerline Pain, MD  ?gabapentin (NEURONTIN) 300 MG capsule Take 1 capsule (300 mg total) by mouth 3 (three) times daily. 05/21/21  Yes Lesleigh Noe, MD  ?hydrochlorothiazide (HYDRODIURIL) 25 MG tablet TAKE 1 TABLET BY MOUTH ONCE A DAY ?Patient taking differently: Take 25 mg by mouth daily. 07/27/21  Yes Lesleigh Noe, MD  ?isosorbide mononitrate (IMDUR) 30 MG 24 hr tablet TAKE 1 TABLET BY MOUTH ONCE A DAY ?Patient taking differently: Take 30 mg by mouth daily. 07/22/21  Yes Lesleigh Noe, MD  ?loratadine (CLARITIN) 10 MG tablet Take 1 tablet (10 mg total) by mouth every morning. 11/08/13  Yes Lucille Passy, MD  ?losartan (COZAAR) 100 MG tablet TAKE 1 TABLET BY MOUTH ONCE A DAY ?Patient taking differently: Take 100 mg by mouth daily. 06/16/21  Yes Jerline Pain, MD  ?metoprolol succinate (TOPROL-XL)  25 MG 24 hr tablet TAKE 1 TABLET BY MOUTH ONCE A DAY ?Patient taking differently: Take 25 mg by mouth daily. 07/22/21  Yes Lesleigh Noe, MD  ?Multiple Vitamin (MULTIVITAMIN) tablet Take 1 tablet by mouth every morning.    Yes [provider]  ?MYRBETRIQ 50 MG TB24 tablet Take 50 mg by mouth daily. 09/28/21  Yes [provider]  ?nitroGLYCERIN (NITROSTAT) 0.4 MG SL tablet Place 1 tablet (0.4 mg total) under the tongue every 5 (five) minutes as needed for chest pain. 06/23/21  Yes Jerline Pain, MD  ?Omega-3 Fatty Acids (FISH OIL) 1200 MG CAPS Take 1,200 mg by mouth every morning.    Yes [provider]  ?omeprazole (PRILOSEC) 40 MG capsule TAKE 1 CAPSULE BY MOUTH TWICE DAILY ?Patient taking differently: Take 40 mg by mouth 2 (two) times daily. 05/11/21  Yes Zehr, Janett Billow D, PA-C  ?potassium chloride SA (KLOR-CON) 20 MEQ tablet TAKE 2 TABLETS BY MOUTH TWICE DAILY ?Patient taking differently: Take 40 mEq by mouth 2 (two) times daily. 02/13/21  Yes Jerline Pain, MD  ?traMADol (ULTRAM) 50 MG tablet Take 1 tablet (50 mg total) by mouth every 6 (six) hours as needed for severe pain. 05/21/21  Yes Lesleigh Noe, MD  ?ursodiol (ACTIGALL) 300 MG capsule Take 600 mg by mouth 2 (two) times daily. 07/17/21  Yes [provider]  ?vitamin E 180 MG (400 UNITS) capsule Take 800 Units by mouth in the morning and at bedtime.   Yes [provider]  ?lidocaine (XYLOCAINE) 2 % solution Use as directed 15 mLs in the mouth or throat as needed for mouth pain. ?Patient not taking: Reported on 11/16/2021 10/20/21   Sharion Balloon, NP  ?   ? ?Allergies    ?Promethazine-dm, Bactroban [mupirocin calcium], Codeine, Valtrex [valacyclovir hcl], and Penicillins   ? ?Review of Systems   ?Review of Systems  ?All other systems reviewed and are negative. ? ?Physical Exam ?Updated Vital Signs ?BP (!) 130/55   Pulse (!) 57   Temp 98.2 ?F (36.8 ?C) (Oral)   Resp 14   LMP  (LMP Unknown)   SpO2 95%  ?Physical Exam ?Vitals and nursing note reviewed.  ?Constitutional:   ?   General: She is not in acute distress. ?   Appearance: She is well-developed and normal weight. She is not ill-appearing.  ?Eyes:  ?   Extraocular Movements: Extraocular movements intact.  ?   Pupils: Pupils are equal, round, and reactive to light.  ?Cardiovascular:  ?   Rate and Rhythm: Normal rate and regular rhythm.  ?   Heart sounds: Normal heart sounds.  ?Pulmonary:  ?   Effort: Pulmonary effort is normal.  ?   Breath sounds: Normal breath sounds.  ?Abdominal:  ?   General: Bowel sounds are normal.  ?   Palpations: Abdomen is soft.   ?Musculoskeletal:     ?   General: Normal range of motion.  ?   Cervical back: Normal range of motion and neck supple.  ?   Right lower leg: No tenderness. No edema.  ?   Left lower leg: No tenderness. No edema.  ?Skin: ?   General: Skin is warm and dry.  ?Neurological:  ?   General: No focal deficit present.  ?   Mental Status: She is alert.  ?   Cranial Nerves: No cranial nerve deficit.  ?   Motor: No weakness.  ?Psychiatric:     ?  Mood and Affect: Mood is not anxious.  ? ? ?ED Results / Procedures / Treatments   ?Labs ?(all labs ordered are listed, but only abnormal results are displayed) ?Labs Reviewed  ?BASIC METABOLIC PANEL - Abnormal; Notable for the following components:  ?    Result Value  ? Potassium 3.2 (*)   ? Glucose, Bld 114 (*)   ? All other components within normal limits  ?CBC WITH DIFFERENTIAL/PLATELET - Abnormal; Notable for the following components:  ? WBC 11.4 (*)   ? All other components within normal limits  ?HEPATIC FUNCTION PANEL - Abnormal; Notable for the following components:  ? Albumin 3.2 (*)   ? All other components within normal limits  ?CBG MONITORING, ED - Abnormal; Notable for the following components:  ? Glucose-Capillary 110 (*)   ? All other components within normal limits  ?LIPASE, BLOOD  ?TROPONIN I (HIGH SENSITIVITY)  ?TROPONIN I (HIGH SENSITIVITY)  ? ? ?EKG ?EKG Interpretation ? ?Date/Time:  Monday November 16 2021 10:18:57 EDT ?Ventricular Rate:  81 ?PR Interval:  174 ?QRS Duration: 124 ?QT Interval:  398 ?QTC Calculation: 462 ?R Axis:   92 ?Text Interpretation: Normal sinus rhythm Right bundle branch block Abnormal ECG When compared with ECG of 24-Jan-2020 09:24, PREVIOUS ECG IS PRESENT Confirmed by Pattricia Boss [phone]) on 11/16/2021 11:19:13 AM ? ?Radiology ?CT ANGIO HEAD NECK W WO CM ? ?Result Date: 11/16/2021 ?CLINICAL DATA:  Left-sided neck pain radiating to collarbone and jaw for 2 days, headache, intermittent dizziness EXAM: CT ANGIOGRAPHY HEAD AND NECK TECHNIQUE:  Multidetector CT imaging of the head and neck was performed using the standard protocol during bolus administration of intravenous contrast. Multiplanar CT image reconstructions and MIPs were obtained to evaluate t

## 2021-11-16 NOTE — Telephone Encounter (Signed)
Patient said she has a pain in her Left shoulder and collar bone. It started a couple weeks ago in her jaw and has been moving down. She has had some nausea and a headache . ?She would not call it chest pain. ? ?She just wanted to come in and get checked out  ?

## 2021-11-16 NOTE — Telephone Encounter (Signed)
Patient's daughter is calling back stating the patient is at the hospital as advised and they stated everything seemed fine besides a clogged artery in her neck. They cannot tell her the cause of pain only what it is not. They are wanting to confirm it is okay for her to be discharged with this, they are awaiting a response from our office before leaving the area to go home. Please advise. ?

## 2021-11-16 NOTE — Discharge Instructions (Addendum)
There is no evidence of heart attack, or other severe abnormalities in your head, neck, or chest. ?Please use acetaminophen and hot and cold therapy as discussed.  Please return if you are having worsening symptoms.  Please call your primary care physician for follow-up ?Please call your primary care physician for follow-up of the 50% left internal carotid artery stenosis. ?

## 2021-11-16 NOTE — Telephone Encounter (Signed)
Advise the patient she needed to go to the ER for a cardiac evaluation now. Verbalized understanding and agreement.  ?

## 2021-11-17 ENCOUNTER — Telehealth: Payer: Self-pay | Admitting: Neurology

## 2021-11-17 ENCOUNTER — Encounter: Payer: Self-pay | Admitting: *Deleted

## 2021-11-17 DIAGNOSIS — M542 Cervicalgia: Secondary | ICD-10-CM

## 2021-11-17 DIAGNOSIS — R42 Dizziness and giddiness: Secondary | ICD-10-CM

## 2021-11-17 NOTE — Telephone Encounter (Signed)
Spoke with pt daughter informed her  that the narrowing in the blood vessel in her neck would not cause pain. Vascular surgery and Cardiology are the ones who follow-up on the blood vessels. We can refer to Vascular specialist if they want. For the pain, pls have her contact PCP. They would like to see Vascular order placed in epic ?

## 2021-11-17 NOTE — Telephone Encounter (Signed)
Pt's daughter called in and left a message. The patient had a lot of pain and discomfort over the weekend. Her heart doctor sent her to the ED. She has an artery that has blockage in her neck on the left side. The hospital wants them to follow up with our office about this.  ?

## 2021-11-17 NOTE — Telephone Encounter (Signed)
Pls let daughter know that the narrowing in the blood vessel in her neck would not cause pain. Vascular surgery and Cardiology are the ones who follow-up on the blood vessels. We can refer to Vascular specialist if they want. For the pain, pls have her contact PCP, thanks  ?

## 2021-11-17 NOTE — Addendum Note (Signed)
Addended by: Jake Seats on: 11/17/2021 01:42 PM ? ? Modules accepted: Orders ? ?

## 2021-11-18 ENCOUNTER — Ambulatory Visit (INDEPENDENT_AMBULATORY_CARE_PROVIDER_SITE_OTHER): Payer: PPO | Admitting: Family Medicine

## 2021-11-18 VITALS — BP 160/88 | HR 67 | Temp 97.6°F | Ht 62.0 in | Wt 163.4 lb

## 2021-11-18 DIAGNOSIS — R519 Headache, unspecified: Secondary | ICD-10-CM

## 2021-11-18 DIAGNOSIS — M5416 Radiculopathy, lumbar region: Secondary | ICD-10-CM | POA: Diagnosis not present

## 2021-11-18 MED ORDER — MELOXICAM 7.5 MG PO TABS: 7.5000 mg | ORAL_TABLET | Freq: Every day | ORAL | 0 refills | Status: DC

## 2021-11-18 NOTE — Patient Instructions (Signed)
Face pain ?- continue with plan to see vascular surgeon and ENT ?- try meloxicam 7.5 mg daily for up to 2 weeks if helping ?- if not helping, do not continue due to risk for heart and kidney injury ?

## 2021-11-18 NOTE — Progress Notes (Signed)
? ?Subjective:  ? ?  ?Hannah Vasquez is a 71 y.o. female presenting for Mass (According to dentist visit, after her last OV here, Dentist says there is a palpable grape sized nodule in the lower left area. ) ?  ? ? ?HPI ? ?#Face pain ?- mri with neuro looked good - no issues ?- continues to have face pain ?- tried heating pad ?- Monday with persistent pain  ?- called her cardiologist - told her to go to the ER ?- cardiac rule-out ?- 50% blockage on left carotid artery - daughter called the cardiologist - they referred to vascular ?- and advised seeing PCP for pain  ?- dentist last week for pain - everything was fine per their work-up ?- daughter called the dentist - x-ray w/o findings, exam grape size nodule lower left - ?salivary gland infection/growth ? ?Left side of the face ?- traveling down the right neck ?- jaw ?- also some headaches ?- cheek and below ?- rare temple location HA ?- taking tylenol which helps ?- pain is aching ?- left side is swollen compared to right ?- pressure sensation  ?- on gabapentin 300 mg tid ?- also tramadol 50 mg up to every 6 hours ?- occasional tylenol ?- no change with her chronic pain medication ? ?Review of Systems ? ? ?Social History  ? ?Tobacco Use  ?Smoking Status Never  ?Smokeless Tobacco Never  ? ? ? ?   ?Objective:  ?  ?BP Readings from Last 3 Encounters:  ?11/18/21 (!) 160/88  ?11/16/21 132/75  ?11/13/21 138/65  ? ?Wt Readings from Last 3 Encounters:  ?11/18/21 163 lb 6 oz (74.1 kg)  ?11/13/21 164 lb (74.4 kg)  ?11/03/21 164 lb (74.4 kg)  ? ? ?BP (!) 160/88   Pulse 67   Temp 97.6 ?F (36.4 ?C) (Oral)   Ht 5' 2"  (1.575 m)   Wt 163 lb 6 oz (74.1 kg)   LMP  (LMP Unknown)   SpO2 97%   BMI 29.88 kg/m?  ? ? ?Physical Exam ?Constitutional:   ?   General: She is not in acute distress. ?   Appearance: She is well-developed. She is not diaphoretic.  ?HENT:  ?   Head:  ?   Jaw: There is normal jaw occlusion. No tenderness.  ?   Salivary Glands: Right salivary gland is not  diffusely enlarged or tender. Left salivary gland is not diffusely enlarged or tender.  ?   Comments: Left sided facial swelling compared to the right.  ?   Right Ear: External ear normal.  ?   Left Ear: External ear normal.  ?   Nose: Nose normal.  ?Eyes:  ?   Conjunctiva/sclera: Conjunctivae normal.  ?Cardiovascular:  ?   Rate and Rhythm: Normal rate and regular rhythm.  ?Pulmonary:  ?   Effort: Pulmonary effort is normal.  ?   Breath sounds: Normal breath sounds.  ?Musculoskeletal:  ?   Cervical back: Neck supple.  ?Skin: ?   General: Skin is warm and dry.  ?   Capillary Refill: Capillary refill takes less than 2 seconds.  ?Neurological:  ?   Mental Status: She is alert. Mental status is at baseline.  ?Psychiatric:     ?   Mood and Affect: Mood normal.     ?   Behavior: Behavior normal.  ? ? ? ? ? ?   ?Assessment & Plan:  ? ?Problem List Items Addressed This Visit   ? ?  ? Other  ?  Left-sided face pain - Primary  ?  Persistent symptoms.  Etiology unclear, reviewed recent brain MRI and CT scan head and neck.  No abnormal finding findings in the eyes or sinuses.  She does have some neck pain and did have arthritis of the neck.  Though her pain is more anterior.  Did not respond to antibiotics for questionable sinus infection when I saw her couple of weeks ago.  Saw neurology for dizziness face pain not discussed.  She has a referral to vascular due to left-sided blockage, though unclear if this is the cause of her pain.  I have referred her to ear nose and throat for additional evaluation.  Unable to palpate any swelling of the glands or masses.  Discussed trial of NSAID for short course to see if that improves symptoms.  She does have mild swelling over the left cheek compared to the right.  We did review side effects to ursodiol, including dizziness and headache.  She notes that multiple symptoms have started since taking this medication.  Advise discussing with her hepatologist. ?  ?  ? Relevant Medications  ?  meloxicam (MOBIC) 7.5 MG tablet  ? ?I spent 35 minutes with pt , obtaining history, examining, reviewing chart, documenting encounter and discussing the above plan of care. ? ? ?Return if symptoms worsen or fail to improve. ? ?Lesleigh Noe, MD ? ?This visit occurred during the SARS-CoV-2 public health emergency.  Safety protocols were in place, including screening questions prior to the visit, additional usage of staff PPE, and extensive cleaning of exam room while observing appropriate contact time as indicated for disinfecting solutions.  ? ?

## 2021-11-18 NOTE — Assessment & Plan Note (Signed)
Persistent symptoms.  Etiology unclear, reviewed recent brain MRI and CT scan head and neck.  No abnormal finding findings in the eyes or sinuses.  She does have some neck pain and did have arthritis of the neck.  Though her pain is more anterior.  Did not respond to antibiotics for questionable sinus infection when I saw her couple of weeks ago.  Saw neurology for dizziness face pain not discussed.  She has a referral to vascular due to left-sided blockage, though unclear if this is the cause of her pain.  I have referred her to ear nose and throat for additional evaluation.  Unable to palpate any swelling of the glands or masses.  Discussed trial of NSAID for short course to see if that improves symptoms.  She does have mild swelling over the left cheek compared to the right.  We did review side effects to ursodiol, including dizziness and headache.  She notes that multiple symptoms have started since taking this medication.  Advise discussing with her hepatologist. ?

## 2021-11-19 ENCOUNTER — Encounter: Payer: PPO | Admitting: Family Medicine

## 2021-11-23 ENCOUNTER — Other Ambulatory Visit: Payer: Self-pay | Admitting: Family Medicine

## 2021-11-23 DIAGNOSIS — M5416 Radiculopathy, lumbar region: Secondary | ICD-10-CM

## 2021-11-23 NOTE — Telephone Encounter (Signed)
Is this okay to refill? 

## 2021-11-25 DIAGNOSIS — Z1231 Encounter for screening mammogram for malignant neoplasm of breast: Secondary | ICD-10-CM | POA: Diagnosis not present

## 2021-11-27 ENCOUNTER — Ambulatory Visit (INDEPENDENT_AMBULATORY_CARE_PROVIDER_SITE_OTHER): Payer: PPO | Admitting: Emergency Medicine

## 2021-11-27 ENCOUNTER — Ambulatory Visit: Payer: PPO | Admitting: Emergency Medicine

## 2021-11-27 ENCOUNTER — Encounter: Payer: Self-pay | Admitting: Emergency Medicine

## 2021-11-27 DIAGNOSIS — J4520 Mild intermittent asthma, uncomplicated: Secondary | ICD-10-CM | POA: Diagnosis not present

## 2021-11-27 DIAGNOSIS — D869 Sarcoidosis, unspecified: Secondary | ICD-10-CM

## 2021-11-27 LAB — PULMONARY FUNCTION TEST
DL/VA % pred: 96 %
DL/VA: 4.02 ml/min/mmHg/L
DLCO cor % pred: 84 %
DLCO cor: 15.26 ml/min/mmHg
DLCO unc % pred: 83 %
DLCO unc: 15.07 ml/min/mmHg
FEF 25-75 Post: 2.83 L/sec
FEF 25-75 Pre: 2.16 L/sec
FEF2575-%Change-Post: 30 %
FEF2575-%Pred-Post: 172 %
FEF2575-%Pred-Pre: 131 %
FEV1-%Change-Post: 5 %
FEV1-%Pred-Post: 101 %
FEV1-%Pred-Pre: 96 %
FEV1-Post: 2.02 L
FEV1-Pre: 1.91 L
FEV1FVC-%Change-Post: 2 %
FEV1FVC-%Pred-Pre: 108 %
FEV6-%Change-Post: 3 %
FEV6-%Pred-Post: 95 %
FEV6-%Pred-Pre: 92 %
FEV6-Post: 2.41 L
FEV6-Pre: 2.34 L
FEV6FVC-%Pred-Post: 105 %
FEV6FVC-%Pred-Pre: 105 %
FVC-%Change-Post: 3 %
FVC-%Pred-Post: 91 %
FVC-%Pred-Pre: 88 %
FVC-Post: 2.41 L
FVC-Pre: 2.34 L
Post FEV1/FVC ratio: 84 %
Post FEV6/FVC ratio: 100 %
Pre FEV1/FVC ratio: 82 %
Pre FEV6/FVC Ratio: 100 %
RV % pred: 91 %
RV: 1.97 L
TLC % pred: 92 %
TLC: 4.38 L

## 2021-11-27 NOTE — Patient Instructions (Signed)
Full PFT performed today. °

## 2021-11-27 NOTE — Progress Notes (Signed)
? ?Subjective:  ? ? Patient ID: Hannah Vasquez, female    DOB: 28-Jun-1951, 71 y.o.   MRN: [id number] ? ?Shortness of Breath ?Associated symptoms include ear pain and headaches. Pertinent negatives include no fever, leg swelling, rash, rhinorrhea, sore throat, vomiting or wheezing.  ? ?ROV 09/10/21 --71 year old woman, never smoker with hypertension and associated cardiac limitation on CP ST, CAD, mitral valve prolapse, possible mild intermittent asthma although spirometry without clear obstruction.  She has been evaluated for hepatic steatosis and biopsy showed granulomas suggestive of hepatic sarcoidosis.  Elevated ACE level 06/11/2021 at 80 ? ?CT chest 09/01/2021 reviewed by me, shows no axillary, mediastinal or hilar adenopathy.  There is 1.5 cm heterogeneous thyroid nodule.  No focal consolidation.  There is some lingular and anterior left lower lobe groundglass change, mild biapical thickening. ?On omeprazole twice daily, loratadine, fluticasone as needed ?She has albuterol which she uses very rarely.  ?Reports today that she hears some intermittent wheeze w exertion. She has some exertional SOB, happens with mild exertion. Can happen with conversation. She had gained 10-15lbs over 2 yrs. Denies any CP.  ? ? ?ROV 11/27/21 --follow-up visit for 71 year old never smoker with a history of hypertension, CAD, mitral valve prolapse.  She has suspected sarcoidosis based on granulomatous disease found on biopsy for hepatic steatosis.  She also has some nonspecific lingular and anterior left lower lobe groundglass change and some biapical thickening on CT scan of the chest (08/2021). ? ?She reports that she hears some wheeze w exertion. She has had L sore throat, L facial pain for several weeks. L sided HA.   ?She has allergic rhinitis and is on loratadine, fluticasone nasal spray as needed ? ?We had arranged for thyroid biopsy of a nodule that turned out to be benign. ? ?Pulmonary function testing performed today and  reviewed by me showed normal spirometry without a bronchodilator response, normal lung volumes, normal diffusion capacity and flow volume loop. ? ? ?Review of Systems  ?Constitutional:  Negative for fever and unexpected weight change.  ?HENT:  Positive for congestion, dental problem and ear pain. Negative for nosebleeds, postnasal drip, rhinorrhea, sneezing, sore throat and trouble swallowing.   ?Eyes:  Positive for itching. Negative for redness.  ?Respiratory:  Positive for chest tightness and shortness of breath. Negative for cough and wheezing.   ?Cardiovascular:  Positive for palpitations. Negative for leg swelling.  ?Gastrointestinal:  Positive for nausea. Negative for vomiting.  ?Genitourinary:  Negative for dysuria.  ?Musculoskeletal:  Negative for joint swelling.  ?Skin:  Negative for rash.  ?Allergic/Immunologic: Negative.  Negative for environmental allergies, food allergies and immunocompromised state.  ?Neurological:  Positive for headaches.  ?Hematological:  Bruises/bleeds easily.  ?Psychiatric/Behavioral:  Negative for dysphoric mood. The patient is not nervous/anxious.   ? ? ?Past Medical History:  ?Diagnosis Date  ? Allergy   ? Anxiety   ? CAD (coronary artery disease)   ? Cataract   ? Does use hearing aid   ? bilateral as of 10/05/2019  ? Elevated alkaline phosphatase level   ? Gallstones   ? Hemorrhoids   ? Hepatic steatosis   ? History of MI (myocardial infarction) 10/07/2017  ? Hypertension   ? Mitral valve prolapse   ? Osteoporosis   ? Sarcoidosis   ? Status post dilation of esophageal narrowing   ?  ? ?Family History  ?Problem Relation Age of Onset  ? Mitral valve prolapse Mother   ? Hypertension Mother   ? Hypertension Father   ?  Brain cancer Father   ? Colon cancer Neg Hx   ? Stomach cancer Neg Hx   ? Esophageal cancer Neg Hx   ? Pancreatic cancer Neg Hx   ?  ? ?Social History  ? ?Socioeconomic History  ? Marital status: Divorced  ?  Spouse name: Not on file  ? Number of children: 2  ? Years  of education: high school  ? Highest education level: Not on file  ?Occupational History  ? Occupation: Medical office---front desk  ?  Comment: Retired  ?Tobacco Use  ? Smoking status: Never  ? Smokeless tobacco: Never  ?Vaping Use  ? Vaping Use: Never used  ?Substance and Sexual Activity  ? Alcohol use: No  ?  Alcohol/week: 0.0 standard drinks  ? Drug use: No  ? Sexual activity: Not Currently  ?  Birth control/protection: Post-menopausal  ?Other Topics Concern  ? Not on file  ?Social History Narrative  ? 10/09/19  ? From: the area  ? Living: alone  ? 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   ? Family: [place] (nearby) - keeps in good touch with them, 3 grandchildren  ?   ? Enjoys: walking, outdoor activities, family time  ?   ? Exercise: mostly just walking around the house, in cardio rehab [facility]  ? Diet: not great currently  ?   ? Safety  ? Seat belts: Yes   ? Guns: No  ? Safe in relationships: Yes   ? Right handed   ?   ? ?Social Determinants of Health  ? ?Financial Resource Strain: Not on file  ?Food Insecurity: Not on file  ?Transportation Needs: Not on file  ?Physical Activity: Not on file  ?Stress: Not on file  ?Social Connections: Not on file  ?Intimate Partner Violence: Not on file  ?  ? ?Allergies  ?Allergen Reactions  ? Promethazine-Dm Nausea And Vomiting  ? Bactroban [Mupirocin Calcium]   ?  Facial redness and swelling  ? Doxycycline Nausea And Vomiting  ? Codeine Nausea And Vomiting  ? Valtrex [Valacyclovir Hcl] Swelling  ? Penicillins Nausea And Vomiting and Rash  ?  ? ?Outpatient Medications Prior to Visit  ?Medication Sig Dispense Refill  ? acetaminophen (TYLENOL) 500 MG tablet Take 500 mg by mouth every 6 (six) hours as needed for moderate pain.    ? albuterol (VENTOLIN HFA) 108 (90 Base) MCG/ACT inhaler Inhale 1 puff into the lungs every 6 (six) hours as needed for wheezing or shortness of breath. 18 g 1  ? amLODipine (NORVASC) 5 MG tablet TAKE 1 TABLET BY MOUTH  ONCE A DAY (Patient taking differently: Take 5 mg by mouth daily.) 90 tablet 2  ? aspirin EC 81 MG tablet Take 81 mg by mouth every morning.     ? atorvastatin (LIPITOR) 80 MG tablet Take 1 tablet (80 mg total) by mouth daily at 6 PM. 90 tablet 2  ? Calcium-Magnesium-Vitamin D (CALCIUM 1200+D3 PO) Take 1,200 mg by mouth daily.    ? fluticasone (FLONASE) 50 MCG/ACT nasal spray Place 1 spray into both nostrils daily as needed for allergies.    ? furosemide (LASIX) 20 MG tablet Take 1 tablet (20 mg total) by mouth daily as needed. (Patient taking differently: Take 20 mg by mouth daily as needed for edema.) 90 tablet 3  ? gabapentin (NEURONTIN) 300 MG capsule Take 1 capsule (300 mg total) by mouth 3 (three) times daily. 270 capsule 3  ?  hydrochlorothiazide (HYDRODIURIL) 25 MG tablet TAKE 1 TABLET BY MOUTH ONCE A DAY (Patient taking differently: Take 25 mg by mouth daily.) 90 tablet 3  ? isosorbide mononitrate (IMDUR) 30 MG 24 hr tablet TAKE 1 TABLET BY MOUTH ONCE A DAY (Patient taking differently: Take 30 mg by mouth daily.) 90 tablet 1  ? lidocaine (XYLOCAINE) 2 % solution Use as directed 15 mLs in the mouth or throat as needed for mouth pain. 100 mL 0  ? loratadine (CLARITIN) 10 MG tablet Take 1 tablet (10 mg total) by mouth every morning. 30 tablet 2  ? losartan (COZAAR) 100 MG tablet TAKE 1 TABLET BY MOUTH ONCE A DAY (Patient taking differently: Take 100 mg by mouth daily.) 90 tablet 3  ? meloxicam (MOBIC) 7.5 MG tablet Take 1 tablet (7.5 mg total) by mouth daily. 14 tablet 0  ? metoprolol succinate (TOPROL-XL) 25 MG 24 hr tablet TAKE 1 TABLET BY MOUTH ONCE A DAY (Patient taking differently: Take 25 mg by mouth daily.) 90 tablet 1  ? Multiple Vitamin (MULTIVITAMIN) tablet Take 1 tablet by mouth every morning.     ? MYRBETRIQ 50 MG TB24 tablet Take 50 mg by mouth daily.    ? nitroGLYCERIN (NITROSTAT) 0.4 MG SL tablet Place 1 tablet (0.4 mg total) under the tongue every 5 (five) minutes as needed for chest pain. 25  tablet 3  ? Omega-3 Fatty Acids (FISH OIL) 1200 MG CAPS Take 1,200 mg by mouth every morning.    ? omeprazole (PRILOSEC) 40 MG capsule TAKE 1 CAPSULE BY MOUTH TWICE DAILY (Patient taking differently: Take 40 mg

## 2021-11-27 NOTE — Progress Notes (Signed)
Full PFT performed today. °

## 2021-11-27 NOTE — Assessment & Plan Note (Signed)
Pulmonary function testing reassuring without any evidence of obstruction or restriction.  Her CT scan of the chest from 08/2021 and then again from 11/16/2021 shows some bibasilar reticular changes consistent with either atelectasis or prior inflammatory change.  No evidence of active sarcoidosis.  We will plan to follow her by chest x-ray for now, repeat her CT chest if there is a clinical change.  Follow-up in 1 year or sooner if needed ?

## 2021-11-27 NOTE — Assessment & Plan Note (Signed)
No evidence for obstruction on her pulmonary function testing.  I think she does have upper airway irritation syndrome, hears wheeze when she exerts.  Her rhinitis and GERD appear to be well treated.  She is going to see ENT soon. ?

## 2021-11-27 NOTE — Patient Instructions (Addendum)
We reviewed your pulmonary function testing today.  These are normal.  Good news. ?We do not need to start any scheduled inhaler medication at this time ?Please keep albuterol available to use 2 puffs if needed for shortness of breath, chest tightness, wheezing. ?Continue to use your loratadine.  Use your fluticasone nasal spray 2 sprays each nostril if needed for congestion and drainage. ?Agree with being seen by ENT ?Follow with Hannah Vasquez as planned.  If he approves then we would support slowly and steadily increasing your exercise and conditioning. ?Follow with Hannah Vasquez in 12 months with a chest x-ray on the same day, or sooner if you have any problems.  ?

## 2021-12-01 ENCOUNTER — Encounter: Payer: Self-pay | Admitting: Family Medicine

## 2021-12-01 DIAGNOSIS — G4733 Obstructive sleep apnea (adult) (pediatric): Secondary | ICD-10-CM | POA: Diagnosis not present

## 2021-12-01 DIAGNOSIS — N6002 Solitary cyst of left breast: Secondary | ICD-10-CM | POA: Diagnosis not present

## 2021-12-01 DIAGNOSIS — R928 Other abnormal and inconclusive findings on diagnostic imaging of breast: Secondary | ICD-10-CM | POA: Diagnosis not present

## 2021-12-01 LAB — HM MAMMOGRAPHY

## 2021-12-02 ENCOUNTER — Encounter: Payer: Self-pay | Admitting: Internal Medicine

## 2021-12-02 ENCOUNTER — Ambulatory Visit: Payer: PPO | Admitting: Internal Medicine

## 2021-12-02 VITALS — BP 120/74 | HR 53 | Ht 62.0 in | Wt 166.0 lb

## 2021-12-02 DIAGNOSIS — R1013 Epigastric pain: Secondary | ICD-10-CM | POA: Diagnosis not present

## 2021-12-02 DIAGNOSIS — K7581 Nonalcoholic steatohepatitis (NASH): Secondary | ICD-10-CM | POA: Diagnosis not present

## 2021-12-02 DIAGNOSIS — D8689 Sarcoidosis of other sites: Secondary | ICD-10-CM | POA: Diagnosis not present

## 2021-12-02 DIAGNOSIS — R748 Abnormal levels of other serum enzymes: Secondary | ICD-10-CM | POA: Diagnosis not present

## 2021-12-02 NOTE — Progress Notes (Signed)
? ?Subjective:  ? ? Patient ID: Hannah Vasquez, female    DOB: 04/16/1951, 71 y.o.   MRN: [id number] ? ?HPI ?Hannah Vasquez is a 71 year old female with a history of hepatic sarcoid/possible PBC/hepatic steatosis, GERD, dyspepsia responsive to PPI, hypertension, CAD and osteoporosis who is here for follow-up.  She is here today with her daughter and was last seen on 07/22/2021. ? ?She reports that she has been dealing with left-sided facial and jaw pain.  Some left throat pain.  Started as an earache.  Became severe.  Was rather constant but did have periods of higher intensity.  Has had a big evaluation including MRI brain, CTA head and neck.  She had a thyroid biopsy which was benign.  She stopped her ursodiol for about a week and feels like the pain improved.  She called liver clinic at [facility] and they did not feel like this was a side effect from this medication and so she resumed it for the past week.  She is still having some though improved left facial pain.  She does have overall fatigue and tiredness.  She is not having abdominal pain.  No jaundice or dark urine.  No itching.  No lower extremity or abdominal swelling. ? ?She has an upcoming visit with ENT. ?She has seen neurology and pulmonary recently. ? ?She is a little concerned because she has been unable to lose weight.  This has been her focus since being told about the steatosis in the liver.  She really has not had the energy for exercise. ? ? ?Review of Systems ?As per HPI, otherwise negative ? ?Current Medications, Allergies, Past Medical History, Past Surgical History, Family History and Social History were reviewed in [employer] record. ?   ?Objective:  ? Physical Exam ?BP 120/74   Pulse (!) 53   Ht 5' 2"  (1.575 m)   Wt 166 lb (75.3 kg)   LMP  (LMP Unknown)   BMI 30.36 kg/m?  ?Gen: awake, alert, NAD ?HEENT: anicteric  ?Ext: no c/c/e ?Neuro: nonfocal ? ?CMP  ?   ?Component Value Date/Time  ? NA 138 11/16/2021 1049  ? NA 141  11/09/2019 0844  ? K 3.2 (L) 11/16/2021 1049  ? CL 106 11/16/2021 1049  ? CO2 25 11/16/2021 1049  ? GLUCOSE 114 (H) 11/16/2021 1049  ? BUN 11 11/16/2021 1049  ? BUN 11 11/09/2019 0844  ? CREATININE 0.97 11/16/2021 1049  ? CALCIUM 9.4 11/16/2021 1049  ? PROT 6.6 11/16/2021 1342  ? PROT 6.7 07/02/2019 1104  ? ALBUMIN 3.2 (L) 11/16/2021 1342  ? ALBUMIN 4.2 07/02/2019 1104  ? AST 22 11/16/2021 1342  ? ALT 16 11/16/2021 1342  ? ALKPHOS 125 11/16/2021 1342  ? BILITOT 0.5 11/16/2021 1342  ? BILITOT 0.8 07/02/2019 1104  ? GFRNONAA >60 11/16/2021 1049  ? GFRAA 67 11/09/2019 0844  ? ? ?  Latest Ref Rng & Units 11/16/2021  ? 10:49 AM 11/03/2021  ?  1:41 PM 05/27/2021  ?  6:32 AM  ?CBC  ?WBC 4.0 - 10.5 K/uL 11.4   12.7   9.4    ?Hemoglobin 12.0 - 15.0 g/dL 13.0   12.7   13.3    ?Hematocrit 36.0 - 46.0 % 40.0   38.5   40.6    ?Platelets 150 - 400 K/uL 324   367.0   321    ? ?Lab Results  ?Component Value Date  ? INR 1.0 05/27/2021  ? INR 1.1 (H) 05/06/2021  ?  INR 1.0 09/25/2019  ? ? ? ? ?   ?Assessment & Plan:  ?71 year old female with a history of hepatic sarcoid/possible PBC/hepatic steatosis, GERD, dyspepsia responsive to PPI, hypertension, CAD and osteoporosis who is here for follow-up.  ? ?NASH (fibrosis 2-3 by bx Nov 2022)/hepatic sarcoid with granulomatous inflammation by biopsy/question PBC --it should be noted that her liver enzymes normalized completely on ursodiol at weight-based dosing (13 mg/kg).  However there is some question as to whether this medication is causing her fatigue, low energy levels and facial pain which has become severe.  We discussed this and while I feel that her facial pain is unlikely related to this medication I cannot tell her that with 962% certainty.  I do think it is definitively helped her liver enzymes which is important given that she has moderate fibrosis.  I reiterated to her and her family that she does not have cirrhosis.  After thorough discussion today I recommended the  following: ?--Given possibility of side effects I would like her to stop ursodiol through Dec 28, 2021; monitor symptoms during the period off of ursodiol; resume ursodiol at same dose 500 mg twice daily from May 15 to June 15 and determine if fatigue or any other side effects recur..  I asked that she send me a MyChart message and we will schedule a visit later in June to let me know if symptoms recur after she resumes ursodiol. ?--Hopefully she will be able to tolerate ursodiol 500 mg twice daily going forward ?--Continue Vitamin E 800 IU daily ?--Continue to work on [employer] and increase exercise as tolerated ?--Repeat hepatic function panel on Dec 28, 2021 to determine if liver enzymes increase after ursodiol is stopped (order in place for her to come for these labs) ? ?2.  Sarcoidosis --hepatic predominantly, see #1.  She has followed with Dr. Lamonte Sakai with pulmonology and no evidence for pulmonary sarcoid at this time ? ?3.  History of vitamin D deficiency --continue vitamin D ? ?4.  Dyspepsia/epigastric pain --this has improved and resolved on PPI, we will continue omeprazole at current dose, 40 mg twice daily AC ? ?5.  Left carotid artery disease --seen recently by CTA, eventual vascular surgery opinion ? ?45 minutes total spent today including patient facing time, coordination of care, reviewing medical history/procedures/pertinent radiology studies, and documentation of the encounter. ? ? ?

## 2021-12-02 NOTE — Patient Instructions (Addendum)
Stop Ursodiol ( Actigall) through 12/28/21.  ? ?Come in for labs on 12/28/21.  ? ?Resume taking Ursodiol on 12/29/21 at same dosing.  ? ?Send my chart message in June letting us know how your doing.  ? ?Please keep follow-up on: 02/09/22 at 8:50am  ? ?Thank you for choosing me and [facility]. ? ?Dr.Jay Pyrtle  ? ?

## 2021-12-04 ENCOUNTER — Encounter: Payer: Self-pay | Admitting: Family Medicine

## 2021-12-08 ENCOUNTER — Ambulatory Visit (INDEPENDENT_AMBULATORY_CARE_PROVIDER_SITE_OTHER): Payer: PPO | Admitting: Family Medicine

## 2021-12-08 VITALS — BP 122/60 | HR 84 | Temp 98.0°F | Ht 62.0 in | Wt 164.5 lb

## 2021-12-08 DIAGNOSIS — Z0000 Encounter for general adult medical examination without abnormal findings: Secondary | ICD-10-CM

## 2021-12-08 MED ORDER — AMLODIPINE BESYLATE 5 MG PO TABS: 5.0000 mg | ORAL_TABLET | Freq: Every day | ORAL | 2 refills | Status: DC

## 2021-12-08 MED ORDER — ISOSORBIDE MONONITRATE ER 30 MG PO TB24: 30.0000 mg | ORAL_TABLET | Freq: Every day | ORAL | 1 refills | Status: DC

## 2021-12-08 MED ORDER — METOPROLOL SUCCINATE ER 25 MG PO TB24
25.0000 mg | ORAL_TABLET | Freq: Every day | ORAL | 1 refills | Status: DC
Start: 2021-12-08 — End: 2022-08-10

## 2021-12-08 NOTE — Progress Notes (Signed)
? ?Subjective:  ? Hannah Vasquez is a 71 y.o. female who presents for Medicare Annual (Subsequent) preventive examination. ? ?Review of Systems    ?Review of Systems  ?Constitutional:  Negative for chills and fever.  ?HENT:  Positive for ear pain. Negative for congestion and sore throat.   ?Eyes:  Negative for blurred vision and double vision.  ?Respiratory:  Negative for shortness of breath.   ?Cardiovascular:  Negative for chest pain.  ?Gastrointestinal:  Negative for heartburn, nausea and vomiting.  ?Genitourinary: Negative.   ?Musculoskeletal:  Positive for neck pain. Negative for myalgias.  ?Skin:  Negative for rash.  ?Neurological:  Negative for dizziness and headaches.  ?Endo/Heme/Allergies:  Does not bruise/bleed easily.  ?Psychiatric/Behavioral:  Negative for depression. The patient is not nervous/anxious.   ? ?Cardiac Risk Factors include: advanced age (>35mn, >>32women);dyslipidemia;obesity (BMI >30kg/m2);hypertension ? ?   ?Objective:  ?  ?Today's Vitals  ? 12/08/21 1057 12/08/21 1103  ?BP: 122/60   ?Pulse: 84   ?Temp: 98 ?F (36.7 ?C)   ?TempSrc: Oral   ?SpO2: 96%   ?Weight: 164 lb 8 oz (74.6 kg)   ?Height: 5' 2"  (1.575 m)   ?PainSc:  2   ? ?Body mass index is 30.09 kg/m?. ? ? ?  12/08/2021  ? 11:09 AM 11/16/2021  ?  1:44 PM 11/13/2021  ? 10:32 AM 05/27/2021  ?  6:42 AM 11/11/2020  ? 11:25 AM 04/25/2019  ?  9:25 AM 04/19/2018  ?  9:11 AM  ?Advanced Directives  ?Does Patient Have a Medical Advance Directive? Yes Yes Yes Yes Yes Yes Yes  ?Type of AIndustrial/product designerof AVarnadoLiving will;Out of facility DNR (pink MOST or yellow form) HChe[place]Living will HPark CityLiving will HDietrichLiving will HUraniaLiving will  ?Does patient want to make changes to medical advance directive? No - Patient declined   No - Patient declined  No - Patient declined   ?Copy of  HFountain Cityin Chart? No - copy requested    No - copy requested No - copy requested No - copy requested  ? ? ?Current Medications (verified) ?Outpatient Encounter Medications as of 12/08/2021  ?Medication Sig  ? acetaminophen (TYLENOL) 500 MG tablet Take 500 mg by mouth every 6 (six) hours as needed for moderate pain.  ? albuterol (VENTOLIN HFA) 108 (90 Base) MCG/ACT inhaler Inhale 1 puff into the lungs every 6 (six) hours as needed for wheezing or shortness of breath.  ? amLODipine (NORVASC) 5 MG tablet TAKE 1 TABLET BY MOUTH ONCE A DAY (Patient taking differently: Take 5 mg by mouth daily.)  ? aspirin EC 81 MG tablet Take 81 mg by mouth every morning.   ? atorvastatin (LIPITOR) 80 MG tablet Take 1 tablet (80 mg total) by mouth daily at 6 PM.  ? Calcium-Magnesium-Vitamin D (CALCIUM 1200+D3 PO) Take 1,200 mg by mouth daily.  ? fluticasone (FLONASE) 50 MCG/ACT nasal spray Place 1 spray into both nostrils daily as needed for allergies.  ? furosemide (LASIX) 20 MG tablet Take 1 tablet (20 mg total) by mouth daily as needed. (Patient taking differently: Take 20 mg by mouth daily as needed for edema.)  ? gabapentin (NEURONTIN) 300 MG capsule Take 1 capsule (300 mg total) by mouth 3 (three) times daily.  ? hydrochlorothiazide (HYDRODIURIL) 25 MG tablet TAKE 1 TABLET BY MOUTH ONCE A DAY (Patient taking differently:  Take 25 mg by mouth daily.)  ? isosorbide mononitrate (IMDUR) 30 MG 24 hr tablet TAKE 1 TABLET BY MOUTH ONCE A DAY (Patient taking differently: Take 30 mg by mouth daily.)  ? loratadine (CLARITIN) 10 MG tablet Take 1 tablet (10 mg total) by mouth every morning.  ? losartan (COZAAR) 100 MG tablet TAKE 1 TABLET BY MOUTH ONCE A DAY (Patient taking differently: Take 100 mg by mouth daily.)  ? meloxicam (MOBIC) 7.5 MG tablet Take 1 tablet (7.5 mg total) by mouth daily.  ? metoprolol succinate (TOPROL-XL) 25 MG 24 hr tablet TAKE 1 TABLET BY MOUTH ONCE A DAY (Patient taking differently: Take 25 mg by  mouth daily.)  ? Multiple Vitamin (MULTIVITAMIN) tablet Take 1 tablet by mouth every morning.   ? MYRBETRIQ 50 MG TB24 tablet Take 50 mg by mouth daily.  ? nitroGLYCERIN (NITROSTAT) 0.4 MG SL tablet Place 1 tablet (0.4 mg total) under the tongue every 5 (five) minutes as needed for chest pain.  ? Omega-3 Fatty Acids (FISH OIL) 1200 MG CAPS Take 1,200 mg by mouth every morning.  ? omeprazole (PRILOSEC) 40 MG capsule TAKE 1 CAPSULE BY MOUTH TWICE DAILY (Patient taking differently: Take 40 mg by mouth 2 (two) times daily.)  ? potassium chloride SA (KLOR-CON) 20 MEQ tablet TAKE 2 TABLETS BY MOUTH TWICE DAILY (Patient taking differently: Take 40 mEq by mouth 2 (two) times daily.)  ? traMADol (ULTRAM) 50 MG tablet TAKE 1 TABLET BY MOUTH EVERY 6 HOURS AS NEEDED FOR SEVERE PAIN  ? vitamin E 180 MG (400 UNITS) capsule Take 800 Units by mouth in the morning and at bedtime.  ? [DISCONTINUED] lidocaine (XYLOCAINE) 2 % solution Use as directed 15 mLs in the mouth or throat as needed for mouth pain.  ? ursodiol (ACTIGALL) 300 MG capsule Take 250 mg by mouth 2 (two) times daily. (Patient not taking: Reported on 12/08/2021)  ? ?No facility-administered encounter medications on file as of 12/08/2021.  ? ? ?Allergies (verified) ?Promethazine-dm, Bactroban [mupirocin calcium], Doxycycline, Codeine, Valtrex [valacyclovir hcl], and Penicillins  ? ?History: ?Past Medical History:  ?Diagnosis Date  ? Allergy   ? Anxiety   ? CAD (coronary artery disease)   ? Cataract   ? Does use hearing aid   ? bilateral as of 10/05/2019  ? Elevated alkaline phosphatase level   ? Gallstones   ? Hemorrhoids   ? Hepatic steatosis   ? History of MI (myocardial infarction) 10/07/2017  ? Hypertension   ? Mitral valve prolapse   ? Osteoporosis   ? Sarcoidosis   ? Status post dilation of esophageal narrowing   ? ?Past Surgical History:  ?Procedure Laterality Date  ? BASAL CELL CARCINOMA EXCISION  02/05/2016  ? Dr. Sarajane Jews, [facility]  ? BLADDER SURGERY   11/15/1995  ? bladder neck suspension, urinary incontinence  ? CARDIAC CATHETERIZATION  01/03/2017 DES LAD  ? CATARACT EXTRACTION Left 04/30/2016  ? Dr. Tommy Rainwater, [place]  ? CORONARY STENT INTERVENTION N/A 01/03/2017  ? Procedure: Coronary Stent Intervention;  Surgeon: Nelva Bush, MD;  Location: [place] [facility];  Service: Cardiovascular;  Laterality: N/A;  ? DENTAL SURGERY  06/20/2016  ? Tooth Implant; Dr. Kalman Shan  ? INTRAVASCULAR ULTRASOUND/IVUS N/A 01/03/2017  ? Procedure: Intravascular Ultrasound/IVUS;  Surgeon: Nelva Bush, MD;  Location: [place] [facility];  Service: Cardiovascular;  Laterality: N/A;  ? KNEE SURGERY  2003  ? torn meniscus  ? LEFT HEART CATH AND CORONARY ANGIOGRAPHY N/A 01/03/2017  ? Procedure:  Left Heart Cath and Coronary Angiography;  Surgeon: Nelva Bush, MD;  Location: [place] [facility];  Service: Cardiovascular;  Laterality: N/A;  ? MOHS SURGERY Right 2017  ? right side of face  ? ROTATOR CUFF REPAIR    ? SHOULDER SURGERY Right 2005  ? ?Family History  ?Problem Relation Age of Onset  ? Mitral valve prolapse Mother   ? Hypertension Mother   ? Hypertension Father   ? Brain cancer Father   ? Colon cancer Neg Hx   ? Stomach cancer Neg Hx   ? Esophageal cancer Neg Hx   ? Pancreatic cancer Neg Hx   ? ?Social History  ? ?Socioeconomic History  ? Marital status: Divorced  ?  Spouse name: Not on file  ? Number of children: 2  ? Years of education: high school  ? Highest education level: Not on file  ?Occupational History  ? Occupation: Medical office---front desk  ?  Comment: Retired  ?Tobacco Use  ? Smoking status: Never  ? Smokeless tobacco: Never  ?Vaping Use  ? Vaping Use: Never used  ?Substance and Sexual Activity  ? Alcohol use: No  ?  Alcohol/week: 0.0 standard drinks  ? Drug use: No  ? Sexual activity: Not Currently  ?  Birth control/protection: Post-menopausal  ?Other Topics Concern  ? Not on file  ?Social History Narrative  ? 10/09/19  ? From: the area  ? Living: alone   ? 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   ? Family: [place] and [place] (nearby) - keeps in good touch with them, 3 grandchildren  ?   ? Enjoys: walking, outdoor activities, family

## 2021-12-08 NOTE — Patient Instructions (Addendum)
Patient Care Team: ?Lesleigh Noe, MD as PCP - General (Family Medicine) ?Jerline Pain, MD as PCP - Cardiology (Cardiology) ?Sueanne Margarita, MD as PCP - Sleep Medicine (Cardiology) ?Darleen Crocker, MD as Consulting Physician (Ophthalmology) ?Thelma Comp, OD as Consulting Physician (Optometry) ?Griselda Miner, MD as Consulting Physician (Dermatology) ?Renee Pain, DDS as Consulting Physician (Dentistry) ?Cameron Sprang, MD as Consulting Physician (Neurology) ?Skotnicki, Franciso Bend, DO as Set designer (Otolaryngology) ?Collene Gobble, MD as Consulting Physician (Pulmonary Disease) ?Drazek, Dawn, CRNP as Designer, jewellery (Nurse Practitioner) ?Sherlyn Hay, DO as Consulting Physician (Obstetrics and Gynecology) ? ? ?Not sure who you see for Urology at [facility] ? ? ?Ask for the next provider to check cholesterol ? ?Shingles and Tdap at the pharmacy ?

## 2021-12-14 DIAGNOSIS — M170 Bilateral primary osteoarthritis of knee: Secondary | ICD-10-CM | POA: Diagnosis not present

## 2021-12-14 DIAGNOSIS — M26609 Unspecified temporomandibular joint disorder, unspecified side: Secondary | ICD-10-CM | POA: Diagnosis not present

## 2021-12-14 DIAGNOSIS — R519 Headache, unspecified: Secondary | ICD-10-CM | POA: Diagnosis not present

## 2021-12-14 DIAGNOSIS — J309 Allergic rhinitis, unspecified: Secondary | ICD-10-CM | POA: Diagnosis not present

## 2021-12-17 DIAGNOSIS — M5416 Radiculopathy, lumbar region: Secondary | ICD-10-CM | POA: Diagnosis not present

## 2021-12-19 ENCOUNTER — Other Ambulatory Visit: Payer: Self-pay | Admitting: Gastroenterology

## 2021-12-21 ENCOUNTER — Other Ambulatory Visit: Payer: Self-pay

## 2021-12-21 DIAGNOSIS — M5416 Radiculopathy, lumbar region: Secondary | ICD-10-CM

## 2021-12-22 ENCOUNTER — Encounter: Payer: PPO | Admitting: Family Medicine

## 2021-12-22 MED ORDER — TRAMADOL HCL 50 MG PO TABS: 50.0000 mg | ORAL_TABLET | Freq: Four times a day (QID) | ORAL | 2 refills | Status: DC | PRN

## 2021-12-22 NOTE — Telephone Encounter (Signed)
Pt states that she takes 3 a day and on her really bad days, she takes a 4th one.  ?

## 2021-12-22 NOTE — Addendum Note (Signed)
Addended by: Lesleigh Noe on: 12/22/2021 02:33 PM ? ? Modules accepted: Orders ? ?

## 2021-12-22 NOTE — Telephone Encounter (Signed)
She has always received #90/month.  ? ?This was refilled. It is ok to occasionally take 4 if needed, but I will not be able to increase her total monthly supply at this time.  ?

## 2021-12-22 NOTE — Addendum Note (Signed)
Addended by: Loreen Freud on: 12/22/2021 02:07 PM ? ? Modules accepted: Orders ? ?

## 2021-12-28 ENCOUNTER — Other Ambulatory Visit (INDEPENDENT_AMBULATORY_CARE_PROVIDER_SITE_OTHER): Payer: PPO

## 2021-12-28 DIAGNOSIS — K7581 Nonalcoholic steatohepatitis (NASH): Secondary | ICD-10-CM

## 2021-12-28 DIAGNOSIS — R748 Abnormal levels of other serum enzymes: Secondary | ICD-10-CM | POA: Diagnosis not present

## 2021-12-29 LAB — HEPATIC FUNCTION PANEL
ALT: 20 U/L (ref 0–35)
AST: 19 U/L (ref 0–37)
Albumin: 3.9 g/dL (ref 3.5–5.2)
Alkaline Phosphatase: 129 U/L — ABNORMAL HIGH (ref 39–117)
Bilirubin, Direct: 0 mg/dL (ref 0.0–0.3)
Total Bilirubin: 0.4 mg/dL (ref 0.2–1.2)
Total Protein: 6.8 g/dL (ref 6.0–8.3)

## 2021-12-30 ENCOUNTER — Other Ambulatory Visit: Payer: Self-pay

## 2021-12-30 DIAGNOSIS — R748 Abnormal levels of other serum enzymes: Secondary | ICD-10-CM

## 2021-12-31 ENCOUNTER — Telehealth: Payer: Self-pay | Admitting: Neurology

## 2021-12-31 DIAGNOSIS — M542 Cervicalgia: Secondary | ICD-10-CM

## 2021-12-31 DIAGNOSIS — I6522 Occlusion and stenosis of left carotid artery: Secondary | ICD-10-CM

## 2021-12-31 DIAGNOSIS — R42 Dizziness and giddiness: Secondary | ICD-10-CM

## 2021-12-31 NOTE — Telephone Encounter (Signed)
Patient called and stated she had a referral sent to vein and vascular.  She wanted to see if one could be sent to [facility], closer to her.

## 2021-12-31 NOTE — Telephone Encounter (Signed)
Referral was placed to [facility]

## 2022-01-05 ENCOUNTER — Other Ambulatory Visit: Payer: Self-pay

## 2022-01-05 ENCOUNTER — Telehealth: Payer: Self-pay | Admitting: Internal Medicine

## 2022-01-05 DIAGNOSIS — K7581 Nonalcoholic steatohepatitis (NASH): Secondary | ICD-10-CM

## 2022-01-05 NOTE — Telephone Encounter (Signed)
Note also sent to [facility] NP.

## 2022-01-05 NOTE — Telephone Encounter (Signed)
Patient called states she started taking the Ursodiol again but she wanted to follow up with the office because she's only been on them for a week now and she is having the same symptoms as before. She is not sure if she should stop it. Also has questions regarding the lab order pending.

## 2022-01-05 NOTE — Telephone Encounter (Signed)
After retrial I would recommend that she stop it. Please make Roosevelt Locks, NP aware as she prescribed it initially Thanks JMP  Repeat CBC, CMP, INR in 2 months -- off ursodiol

## 2022-01-05 NOTE — Telephone Encounter (Signed)
Spoke with pt and she is aware of Dr. Vena Rua recommendations. Lab orders in epic and she knows to have drawn in 2 mth.

## 2022-01-05 NOTE — Telephone Encounter (Signed)
Pt states that she started taking the ursodiol again on 5/16. Reports after taking it for a week she has had a slight headache, dizziness, and feels like she is in a fog. She didn't take it this morning but she wanted to let Dr. Hilarie Fredrickson know and see if he thought she should stop it or keep it taking it longer. Reports the way she feels today she does not think she should be driving. Please advise.

## 2022-01-06 ENCOUNTER — Encounter: Payer: PPO | Admitting: Family Medicine

## 2022-01-14 DIAGNOSIS — R531 Weakness: Secondary | ICD-10-CM | POA: Diagnosis not present

## 2022-01-14 DIAGNOSIS — R2689 Other abnormalities of gait and mobility: Secondary | ICD-10-CM | POA: Diagnosis not present

## 2022-01-14 DIAGNOSIS — R2681 Unsteadiness on feet: Secondary | ICD-10-CM | POA: Diagnosis not present

## 2022-01-14 DIAGNOSIS — R42 Dizziness and giddiness: Secondary | ICD-10-CM | POA: Diagnosis not present

## 2022-01-18 DIAGNOSIS — R42 Dizziness and giddiness: Secondary | ICD-10-CM | POA: Diagnosis not present

## 2022-01-18 DIAGNOSIS — R531 Weakness: Secondary | ICD-10-CM | POA: Diagnosis not present

## 2022-01-18 DIAGNOSIS — R2689 Other abnormalities of gait and mobility: Secondary | ICD-10-CM | POA: Diagnosis not present

## 2022-01-18 DIAGNOSIS — R2681 Unsteadiness on feet: Secondary | ICD-10-CM | POA: Diagnosis not present

## 2022-01-27 DIAGNOSIS — R531 Weakness: Secondary | ICD-10-CM | POA: Diagnosis not present

## 2022-01-27 DIAGNOSIS — R42 Dizziness and giddiness: Secondary | ICD-10-CM | POA: Diagnosis not present

## 2022-01-27 DIAGNOSIS — R2689 Other abnormalities of gait and mobility: Secondary | ICD-10-CM | POA: Diagnosis not present

## 2022-01-27 DIAGNOSIS — R2681 Unsteadiness on feet: Secondary | ICD-10-CM | POA: Diagnosis not present

## 2022-02-04 DIAGNOSIS — R42 Dizziness and giddiness: Secondary | ICD-10-CM | POA: Diagnosis not present

## 2022-02-04 DIAGNOSIS — R531 Weakness: Secondary | ICD-10-CM | POA: Diagnosis not present

## 2022-02-04 DIAGNOSIS — R2681 Unsteadiness on feet: Secondary | ICD-10-CM | POA: Diagnosis not present

## 2022-02-04 DIAGNOSIS — R2689 Other abnormalities of gait and mobility: Secondary | ICD-10-CM | POA: Diagnosis not present

## 2022-02-09 ENCOUNTER — Ambulatory Visit: Payer: PPO | Admitting: Internal Medicine

## 2022-02-09 ENCOUNTER — Encounter: Payer: Self-pay | Admitting: Internal Medicine

## 2022-02-09 VITALS — BP 130/76 | HR 69 | Ht 62.0 in | Wt 165.8 lb

## 2022-02-09 DIAGNOSIS — R1013 Epigastric pain: Secondary | ICD-10-CM | POA: Diagnosis not present

## 2022-02-09 DIAGNOSIS — D8689 Sarcoidosis of other sites: Secondary | ICD-10-CM | POA: Diagnosis not present

## 2022-02-09 DIAGNOSIS — K7581 Nonalcoholic steatohepatitis (NASH): Secondary | ICD-10-CM | POA: Diagnosis not present

## 2022-02-11 ENCOUNTER — Encounter (INDEPENDENT_AMBULATORY_CARE_PROVIDER_SITE_OTHER): Payer: Self-pay

## 2022-02-11 ENCOUNTER — Encounter (INDEPENDENT_AMBULATORY_CARE_PROVIDER_SITE_OTHER): Payer: Self-pay | Admitting: Vascular Surgery

## 2022-02-17 ENCOUNTER — Encounter: Payer: Self-pay | Admitting: Vascular Surgery

## 2022-02-17 ENCOUNTER — Ambulatory Visit: Payer: PPO | Admitting: Vascular Surgery

## 2022-02-17 VITALS — BP 144/76 | HR 68 | Temp 97.9°F | Resp 20 | Ht 62.0 in | Wt 165.8 lb

## 2022-02-17 DIAGNOSIS — I6522 Occlusion and stenosis of left carotid artery: Secondary | ICD-10-CM

## 2022-02-17 NOTE — Progress Notes (Signed)
Patient ID: [name], female   DOB: 03-24-1951, 71 y.o.   MRN: [id number]  Reason for Consult: No chief complaint on file.   Referred by Lesleigh Noe, MD  Subjective:     HPI:  Hannah Vasquez is a 71 y.o. female with history of hypertension, hyperlipidemia and coronary artery disease no previous history of vascular disease.  Hannah Vasquez is on aspirin and a statin for previous MI.  Recently was evaluated by neurology for dizziness and was found to have carotid artery stenosis on further work-up.  Hannah Vasquez denies any history of stroke, TIA or amaurosis.  On evaluation by neurology there were no definitive causes for the dizziness and mostly was attributed to obstructive sleep apnea with plans for follow-up with sleep specialist.  Hannah Vasquez now follows up here for evaluation of carotid stenosis.  Hannah Vasquez was having left neck pain and CT scan was obtained.  Hannah Vasquez states that left neck pain has resolved.  Dizziness is also improved with holding some of her medications.  Past Medical History:  Diagnosis Date   Allergy    Anxiety    CAD (coronary artery disease)    Cataract    Does use hearing aid    bilateral as of 10/05/2019   Elevated alkaline phosphatase level    Gallstones    Hemorrhoids    Hepatic steatosis    History of MI (myocardial infarction) 10/07/2017   Hypertension    Mitral valve prolapse    Osteoporosis    Sarcoidosis    Status post dilation of esophageal narrowing    Family History  Problem Relation Age of Onset   Mitral valve prolapse Mother    Hypertension Mother    Hypertension Father    Brain cancer Father    Colon cancer Neg Hx    Stomach cancer Neg Hx    Esophageal cancer Neg Hx    Pancreatic cancer Neg Hx    Past Surgical History:  Procedure Laterality Date   BASAL CELL CARCINOMA EXCISION  02/05/2016   Dr. Sarajane Jews, [facility]   BLADDER SURGERY  11/15/1995   bladder neck suspension, urinary incontinence   CARDIAC CATHETERIZATION  01/03/2017 DES LAD    CATARACT EXTRACTION Left 04/30/2016   Dr. Tommy Rainwater, [place] N/A 01/03/2017   Procedure: Coronary Stent Intervention;  Surgeon: Nelva Bush, MD;  Location: [place] [facility];  Service: Cardiovascular;  Laterality: N/A;   DENTAL SURGERY  06/20/2016   Tooth Implant; Dr. Kalman Shan   INTRAVASCULAR ULTRASOUND/IVUS N/A 01/03/2017   Procedure: Intravascular Ultrasound/IVUS;  Surgeon: Nelva Bush, MD;  Location: [place] [facility];  Service: Cardiovascular;  Laterality: N/A;   KNEE SURGERY  2003   torn meniscus   LEFT HEART CATH AND CORONARY ANGIOGRAPHY N/A 01/03/2017   Procedure: Left Heart Cath and Coronary Angiography;  Surgeon: Nelva Bush, MD;  Location: [place] [facility];  Service: Cardiovascular;  Laterality: N/A;   MOHS SURGERY Right 2017   right side of face   ROTATOR CUFF REPAIR     SHOULDER SURGERY Right 2005    Short Social History:  Social History   Tobacco Use   Smoking status: Never   Smokeless tobacco: Never  Substance Use Topics   Alcohol use: No    Alcohol/week: 0.0 standard drinks of alcohol    Allergies  Allergen Reactions   Promethazine-Dm Nausea And Vomiting   Bactroban [Mupirocin Calcium]     Facial redness and swelling   Doxycycline Nausea  And Vomiting   Codeine Nausea And Vomiting   Valtrex [Valacyclovir Hcl] Swelling   Penicillins Nausea And Vomiting and Rash    Current Outpatient Medications  Medication Sig Dispense Refill   acetaminophen (TYLENOL) 500 MG tablet Take 500 mg by mouth every 6 (six) hours as needed for moderate pain.     albuterol (VENTOLIN HFA) 108 (90 Base) MCG/ACT inhaler Inhale 1 puff into the lungs every 6 (six) hours as needed for wheezing or shortness of breath. 18 g 1   amLODipine (NORVASC) 5 MG tablet Take 1 tablet (5 mg total) by mouth daily. 90 tablet 2   aspirin EC 81 MG tablet Take 81 mg by mouth every morning.      atorvastatin (LIPITOR) 80 MG tablet Take 1 tablet (80 mg total) by  mouth daily at 6 PM. 90 tablet 2   Calcium-Magnesium-Vitamin D (CALCIUM 1200+D3 PO) Take 1,200 mg by mouth daily.     fluticasone (FLONASE) 50 MCG/ACT nasal spray Place 1 spray into both nostrils daily as needed for allergies.     furosemide (LASIX) 20 MG tablet Take 1 tablet (20 mg total) by mouth daily as needed. (Patient taking differently: Take 20 mg by mouth daily as needed for edema.) 90 tablet 3   gabapentin (NEURONTIN) 300 MG capsule Take 1 capsule (300 mg total) by mouth 3 (three) times daily. 270 capsule 3   hydrochlorothiazide (HYDRODIURIL) 25 MG tablet TAKE 1 TABLET BY MOUTH ONCE A DAY (Patient taking differently: Take 25 mg by mouth daily.) 90 tablet 3   isosorbide mononitrate (IMDUR) 30 MG 24 hr tablet Take 1 tablet (30 mg total) by mouth daily. 90 tablet 1   loratadine (CLARITIN) 10 MG tablet Take 1 tablet (10 mg total) by mouth every morning. 30 tablet 2   losartan (COZAAR) 100 MG tablet TAKE 1 TABLET BY MOUTH ONCE A DAY (Patient taking differently: Take 100 mg by mouth daily.) 90 tablet 3   meloxicam (MOBIC) 7.5 MG tablet Take 1 tablet (7.5 mg total) by mouth daily. 14 tablet 0   metoprolol succinate (TOPROL-XL) 25 MG 24 hr tablet Take 1 tablet (25 mg total) by mouth daily. 90 tablet 1   Multiple Vitamin (MULTIVITAMIN) tablet Take 1 tablet by mouth every morning.      MYRBETRIQ 50 MG TB24 tablet Take 50 mg by mouth daily.     nitroGLYCERIN (NITROSTAT) 0.4 MG SL tablet Place 1 tablet (0.4 mg total) under the tongue every 5 (five) minutes as needed for chest pain. 25 tablet 3   Omega-3 Fatty Acids (FISH OIL) 1200 MG CAPS Take 1,200 mg by mouth every morning.     omeprazole (PRILOSEC) 40 MG capsule Take 1 capsule (40 mg total) by mouth 2 (two) times daily. 60 capsule 11   potassium chloride SA (KLOR-CON) 20 MEQ tablet TAKE 2 TABLETS BY MOUTH TWICE DAILY (Patient taking differently: Take 40 mEq by mouth 2 (two) times daily.) 360 tablet 3   traMADol (ULTRAM) 50 MG tablet Take 1 tablet  (50 mg total) by mouth every 6 (six) hours as needed for severe pain. 90 tablet 2   vitamin E 180 MG (400 UNITS) capsule Take 800 Units by mouth in the morning and at bedtime.     No current facility-administered medications for this visit.    Review of Systems  Constitutional:  Constitutional negative. HENT: HENT negative.  Eyes: Eyes negative.  Respiratory: Respiratory negative.  Cardiovascular: Cardiovascular negative.  GI: Gastrointestinal negative.  Musculoskeletal: Musculoskeletal negative.  Skin: Skin negative.  Neurological: Positive for dizziness.  Hematologic: Hematologic/lymphatic negative.  Psychiatric: Psychiatric negative.        Objective:   Vitals:   02/17/22 1051 02/17/22 1054  BP: 134/72 (!) 144/76  Pulse: 68   Resp: 20   Temp: 97.9 F (36.6 C)   SpO2: 95%       Physical Exam HENT:     Head: Normocephalic.     Nose: Nose normal.  Eyes:     Pupils: Pupils are equal, round, and reactive to light.  Neck:     Vascular: No carotid bruit.  Cardiovascular:     Rate and Rhythm: Normal rate.  Pulmonary:     Effort: Pulmonary effort is normal.  Abdominal:     General: Abdomen is flat.     Palpations: Abdomen is soft.  Musculoskeletal:        General: Normal range of motion.     Right lower leg: No edema.     Left lower leg: No edema.  Lymphadenopathy:     Cervical: No cervical adenopathy.  Skin:    General: Skin is warm and dry.  Neurological:     Mental Status: Hannah Vasquez is alert.  Psychiatric:        Mood and Affect: Mood normal.        Behavior: Behavior normal.        Thought Content: Thought content normal.    Data: CTA IMPRESSION: 1. No evidence of acute intracranial pathology. 2. Mixed plaque in the proximal left ICA resulting in approximately 50% stenosis. Otherwise, patent vasculature of the neck. 3. Mild atherosclerotic plaque in the intracranial ICAs without significant stenosis. Otherwise, patent intracranial vasculature.  We  reviewed her CT scan together which demonstrates no right-sided stenosis 50% left ICA stenosis proximally.     Assessment/Plan:    71 year old female with 50% asymptomatic left ICA stenosis.  We discussed the signs and symptoms of stroke as well as her very low risk of this at this time.  Hannah Vasquez will continue on aspirin and statin.  We will follow her up in 1 year with repeat carotid duplex.    Waynetta Sandy MD [facility]

## 2022-02-18 DIAGNOSIS — M5416 Radiculopathy, lumbar region: Secondary | ICD-10-CM | POA: Diagnosis not present

## 2022-02-18 DIAGNOSIS — M5126 Other intervertebral disc displacement, lumbar region: Secondary | ICD-10-CM | POA: Diagnosis not present

## 2022-02-19 DIAGNOSIS — R531 Weakness: Secondary | ICD-10-CM | POA: Diagnosis not present

## 2022-02-19 DIAGNOSIS — R2681 Unsteadiness on feet: Secondary | ICD-10-CM | POA: Diagnosis not present

## 2022-02-19 DIAGNOSIS — R2689 Other abnormalities of gait and mobility: Secondary | ICD-10-CM | POA: Diagnosis not present

## 2022-02-19 DIAGNOSIS — R42 Dizziness and giddiness: Secondary | ICD-10-CM | POA: Diagnosis not present

## 2022-02-26 DIAGNOSIS — R2689 Other abnormalities of gait and mobility: Secondary | ICD-10-CM | POA: Diagnosis not present

## 2022-02-26 DIAGNOSIS — R42 Dizziness and giddiness: Secondary | ICD-10-CM | POA: Diagnosis not present

## 2022-02-26 DIAGNOSIS — R531 Weakness: Secondary | ICD-10-CM | POA: Diagnosis not present

## 2022-02-26 DIAGNOSIS — R2681 Unsteadiness on feet: Secondary | ICD-10-CM | POA: Diagnosis not present

## 2022-03-01 ENCOUNTER — Other Ambulatory Visit: Payer: Self-pay | Admitting: Cardiology

## 2022-03-04 DIAGNOSIS — R42 Dizziness and giddiness: Secondary | ICD-10-CM | POA: Diagnosis not present

## 2022-03-04 DIAGNOSIS — R531 Weakness: Secondary | ICD-10-CM | POA: Diagnosis not present

## 2022-03-04 DIAGNOSIS — R2689 Other abnormalities of gait and mobility: Secondary | ICD-10-CM | POA: Diagnosis not present

## 2022-03-04 DIAGNOSIS — R2681 Unsteadiness on feet: Secondary | ICD-10-CM | POA: Diagnosis not present

## 2022-03-10 DIAGNOSIS — R2681 Unsteadiness on feet: Secondary | ICD-10-CM | POA: Diagnosis not present

## 2022-03-10 DIAGNOSIS — R42 Dizziness and giddiness: Secondary | ICD-10-CM | POA: Diagnosis not present

## 2022-03-10 DIAGNOSIS — R531 Weakness: Secondary | ICD-10-CM | POA: Diagnosis not present

## 2022-03-10 DIAGNOSIS — R2689 Other abnormalities of gait and mobility: Secondary | ICD-10-CM | POA: Diagnosis not present

## 2022-03-11 DIAGNOSIS — M170 Bilateral primary osteoarthritis of knee: Secondary | ICD-10-CM | POA: Diagnosis not present

## 2022-03-15 ENCOUNTER — Ambulatory Visit: Payer: PPO | Admitting: Cardiology

## 2022-03-15 ENCOUNTER — Encounter: Payer: Self-pay | Admitting: Cardiology

## 2022-03-15 VITALS — BP 120/60 | HR 66 | Ht 62.0 in | Wt 163.6 lb

## 2022-03-15 DIAGNOSIS — G4733 Obstructive sleep apnea (adult) (pediatric): Secondary | ICD-10-CM

## 2022-03-15 DIAGNOSIS — I2510 Atherosclerotic heart disease of native coronary artery without angina pectoris: Secondary | ICD-10-CM | POA: Diagnosis not present

## 2022-03-15 DIAGNOSIS — E7800 Pure hypercholesterolemia, unspecified: Secondary | ICD-10-CM

## 2022-03-15 DIAGNOSIS — I10 Essential (primary) hypertension: Secondary | ICD-10-CM

## 2022-03-15 DIAGNOSIS — I341 Nonrheumatic mitral (valve) prolapse: Secondary | ICD-10-CM

## 2022-03-15 NOTE — Patient Instructions (Signed)
Medication Instructions:  The current medical regimen is effective;  continue present plan and medications.  *If you need a refill on your cardiac medications before your next appointment, please call your pharmacy*  Follow-Up: At [facility], you and your health needs are our priority.  As part of our continuing mission to provide you with exceptional heart care, we have created designated Provider Care Teams.  These Care Teams include your primary Cardiologist (physician) and Advanced Practice Providers (APPs -  Physician Assistants and Nurse Practitioners) who all work together to provide you with the care you need, when you need it.  We recommend signing up for the patient portal called "MyChart".  Sign up information is provided on this After Visit Summary.  MyChart is used to connect with patients for Virtual Visits (Telemedicine).  Patients are able to view lab/test results, encounter notes, upcoming appointments, etc.  Non-urgent messages can be sent to your provider as well.   To learn more about what you can do with MyChart, go to [email or web address].    Your next appointment:   1 year(s)  The format for your next appointment:   In Person  Provider:   Candee Furbish, MD {   Important Information About Sugar

## 2022-03-15 NOTE — Progress Notes (Signed)
Cardiology Office Note:    Date:  03/15/2022   ID:  Hannah Vasquez, DOB May 15, 1951, MRN [id number]  PCP:  Lesleigh Noe, MD   [facility] Providers Cardiologist:  Candee Furbish, MD Sleep Medicine:  Fransico Him, MD     Referring MD: Lesleigh Noe, MD    History of Present Illness:    Hannah Vasquez is a 71 y.o. female here for the follow-up coronary artery disease with 2 prior stent placed LAD STEMI May 2018. RBBB  EF at that time was 40% but normalized in July 2018.  Chronic back issues  June 2020 had a stress test to evaluate atypical chest discomfort and it was normal with no ischemia.  Has had back injections by neuro clinic.  Overall stable.  No lasix for a while, has not needed to take this.  Also recently saw Dr. Donzetta Matters with vascular surgery for 50% carotid stenosis.  He will be monitoring this yearly.  Overall been doing quite well.  Has had knee pain.  Awaiting potential knee replacement.  Here with her daughter.  No chest pain fevers chills nausea vomiting syncope bleeding.  Past Medical History:  Diagnosis Date   Allergy    Anxiety    CAD (coronary artery disease)    Cataract    Does use hearing aid    bilateral as of 10/05/2019   Elevated alkaline phosphatase level    Gallstones    Hemorrhoids    Hepatic steatosis    History of MI (myocardial infarction) 10/07/2017   Hypertension    Mitral valve prolapse    Osteoporosis    Sarcoidosis    Status post dilation of esophageal narrowing     Past Surgical History:  Procedure Laterality Date   BASAL CELL CARCINOMA EXCISION  02/05/2016   Dr. Sarajane Jews, [facility]   BLADDER SURGERY  11/15/1995   bladder neck suspension, urinary incontinence   CARDIAC CATHETERIZATION  01/03/2017 DES LAD   CATARACT EXTRACTION Left 04/30/2016   Dr. Tommy Rainwater, [place] N/A 01/03/2017   Procedure: Coronary Stent Intervention;  Surgeon: Nelva Bush, MD;  Location: [place] [facility];  Service: Cardiovascular;  Laterality: N/A;   DENTAL SURGERY  06/20/2016   Tooth Implant; Dr. Kalman Shan   INTRAVASCULAR ULTRASOUND/IVUS N/A 01/03/2017   Procedure: Intravascular Ultrasound/IVUS;  Surgeon: Nelva Bush, MD;  Location: [place] [facility];  Service: Cardiovascular;  Laterality: N/A;   KNEE SURGERY  2003   torn meniscus   LEFT HEART CATH AND CORONARY ANGIOGRAPHY N/A 01/03/2017   Procedure: Left Heart Cath and Coronary Angiography;  Surgeon: Nelva Bush, MD;  Location: [place] [facility];  Service: Cardiovascular;  Laterality: N/A;   MOHS SURGERY Right 2017   right side of face   ROTATOR CUFF REPAIR     SHOULDER SURGERY Right 2005    Current Medications: Current Meds  Medication Sig   acetaminophen (TYLENOL) 500 MG tablet Take 500 mg by mouth every 6 (six) hours as needed for moderate pain.   albuterol (VENTOLIN HFA) 108 (90 Base) MCG/ACT inhaler Inhale 1 puff into the lungs every 6 (six) hours as needed for wheezing or shortness of breath.   amLODipine (NORVASC) 5 MG tablet Take 1 tablet (5 mg total) by mouth daily.   aspirin EC 81 MG tablet Take 81 mg by mouth every morning.    atorvastatin (LIPITOR) 80 MG tablet Take 1 tablet (80 mg total) by mouth daily at 6 PM.  Calcium-Magnesium-Vitamin D (CALCIUM 1200+D3 PO) Take 1,200 mg by mouth daily.   fluticasone (FLONASE) 50 MCG/ACT nasal spray Place 1 spray into both nostrils daily as needed for allergies.   furosemide (LASIX) 20 MG tablet Take 1 tablet (20 mg total) by mouth daily as needed.   gabapentin (NEURONTIN) 300 MG capsule Take 1 capsule (300 mg total) by mouth 3 (three) times daily.   hydrochlorothiazide (HYDRODIURIL) 25 MG tablet TAKE 1 TABLET BY MOUTH ONCE A DAY   isosorbide mononitrate (IMDUR) 30 MG 24 hr tablet Take 1 tablet (30 mg total) by mouth daily.   loratadine (CLARITIN) 10 MG tablet Take 1 tablet (10 mg total) by mouth every morning.   losartan (COZAAR) 100 MG tablet TAKE 1 TABLET BY MOUTH ONCE A  DAY   meloxicam (MOBIC) 7.5 MG tablet Take 1 tablet (7.5 mg total) by mouth daily.   metoprolol succinate (TOPROL-XL) 25 MG 24 hr tablet Take 1 tablet (25 mg total) by mouth daily.   Multiple Vitamin (MULTIVITAMIN) tablet Take 1 tablet by mouth every morning.    MYRBETRIQ 50 MG TB24 tablet Take 50 mg by mouth daily.   nitroGLYCERIN (NITROSTAT) 0.4 MG SL tablet Place 1 tablet (0.4 mg total) under the tongue every 5 (five) minutes as needed for chest pain.   Omega-3 Fatty Acids (FISH OIL) 1200 MG CAPS Take 1,200 mg by mouth every morning.   omeprazole (PRILOSEC) 40 MG capsule Take 1 capsule (40 mg total) by mouth 2 (two) times daily.   potassium chloride SA (KLOR-CON M) 20 MEQ tablet TAKE 2 TABLETS BY MOUTH TWICE DAILY   tiZANidine (ZANAFLEX) 4 MG tablet Take by mouth.   traMADol (ULTRAM) 50 MG tablet Take 1 tablet (50 mg total) by mouth every 6 (six) hours as needed for severe pain.   vitamin E 180 MG (400 UNITS) capsule Take 800 Units by mouth in the morning and at bedtime.     Allergies:   Promethazine-dm, Bactroban [mupirocin calcium], Doxycycline, Codeine, Valtrex [valacyclovir hcl], and Penicillins   Social History   Socioeconomic History   Marital status: Divorced    Spouse name: Not on file   Number of children: 2   Years of education: high school   Highest education level: Not on file  Occupational History   Occupation: Medical office---front desk    Comment: Retired  Tobacco Use   Smoking status: Never   Smokeless tobacco: Never  Vaping Use   Vaping Use: Never used  Substance and Sexual Activity   Alcohol use: No    Alcohol/week: 0.0 standard drinks of alcohol   Drug use: No   Sexual activity: Not Currently    Birth control/protection: Post-menopausal  Other Topics Concern   Not on file  Social History Narrative   10/09/19   From: the area   Living: alone   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      Family: Product manager and Probation officer (nearby) - keeps  in good touch with them, 3 grandchildren      Enjoys: walking, outdoor activities, family time      Exercise: mostly just walking around the house, in cardio rehab at [facility]   Diet: not great currently      Safety   Seat belts: Yes    Guns: No   Safe in relationships: Yes    Right handed       Social Determinants of Health   Financial Resource Strain: Low Risk  (11/11/2020)  Overall Financial Resource Strain (CARDIA)    Difficulty of Paying Living Expenses: Not hard at all  Food Insecurity: No Food Insecurity (11/11/2020)   Hunger Vital Sign    Worried About Running Out of Food in the Last Year: Never true    Ran Out of Food in the Last Year: Never true  Transportation Needs: No Transportation Needs (11/11/2020)   PRAPARE - Hydrologist (Medical): No    Lack of Transportation (Non-Medical): No  Physical Activity: Insufficiently Active (11/11/2020)   Exercise Vital Sign    Days of Exercise per Week: 2 days    Minutes of Exercise per Session: 50 min  Stress: No Stress Concern Present (11/11/2020)   [place]    Feeling of Stress : Not at all  Social Connections: Not on file     Family History: The patient's family history includes Brain cancer in her father; Hypertension in her father and mother; Mitral valve prolapse in her mother. There is no history of Colon cancer, Stomach cancer, Esophageal cancer, or Pancreatic cancer.  ROS:   Please see the history of present illness.     All other systems reviewed and are negative.  EKGs/Labs/Other Studies Reviewed:    The following studies were reviewed today:  ECHO 2021:  1. Left ventricular ejection fraction, by estimation, is 60 to 65%. The  left ventricle has normal function. The left ventricle has no regional  wall motion abnormalities. Left ventricular diastolic parameters were  normal.   2. Right ventricular systolic function is  normal. The right ventricular  size is normal.   3. The mitral valve is normal in structure. Trivial mitral valve  regurgitation. No evidence of mitral stenosis.   4. The aortic valve is tricuspid. Aortic valve regurgitation is not  visualized. No aortic stenosis is present.   EKG:  Right bundle branch block, 81 on 11/17/2021 Recent Labs: 11/03/2021: TSH 2.81 11/16/2021: BUN 11; Creatinine, Ser 0.97; Hemoglobin 13.0; Platelets 324; Potassium 3.2; Sodium 138 12/28/2021: ALT 20  Recent Lipid Panel    Component Value Date/Time   CHOL 121 11/18/2020 1208   CHOL 115 11/09/2019 0844   TRIG 77.0 11/18/2020 1208   HDL 31.70 (L) 11/18/2020 1208   HDL 30 (L) 11/09/2019 0844   CHOLHDL 4 11/18/2020 1208   VLDL 15.4 11/18/2020 1208   LDLCALC 74 11/18/2020 1208   LDLCALC 63 11/09/2019 0844   LDLDIRECT 151.0 09/09/2011 0919     Risk Assessment/Calculations:              Physical Exam:    VS:  BP 120/60   Pulse 66   Ht 5' 2"  (1.575 m)   Wt 163 lb 9.6 oz (74.2 kg)   LMP  (LMP Unknown)   SpO2 97%   BMI 29.92 kg/m     Wt Readings from Last 3 Encounters:  03/15/22 163 lb 9.6 oz (74.2 kg)  02/17/22 165 lb 12.8 oz (75.2 kg)  02/09/22 165 lb 12.8 oz (75.2 kg)     GEN:  Well nourished, well developed in no acute distress HEENT: Normal NECK: No JVD; No carotid bruits LYMPHATICS: No lymphadenopathy CARDIAC: RRR, no murmurs, no rubs, gallops RESPIRATORY:  Clear to auscultation without rales, wheezing or rhonchi  ABDOMEN: Soft, non-tender, non-distended MUSCULOSKELETAL:  No edema; No deformity  SKIN: Warm and dry NEUROLOGIC:  Alert and oriented x 3 PSYCHIATRIC:  Normal affect   ASSESSMENT:  1. Coronary artery disease involving native coronary artery of native heart without angina pectoris   2. Mitral valve prolapse   3. HYPERTENSION, BENIGN   4. Pure hypercholesterolemia   5. OSA (obstructive sleep apnea)    PLAN:    In order of problems listed above:  Coronary artery  disease status post STEMI - Anterior STEMI 12/2016 with EF 40% at the time which eventually returned to normal 55% - She had PCI x2 to the LAD, small diagonal branch was jailed. - She had had some episodes of atypical chest discomfort and a pharmacologic stress test was performed which was normal. - Continue with goal-directed medical therapy which includes aspirin, beta-blocker, high intensity statin LDL 74 hemoglobin A1c 6.5 hemoglobin 12.7 creatinine 0.8 ALT 36.  No additional changes.  Excellent.   Hyperlipidemia - Continue with atorvastatin 80 mg a day, last LDL 74.  Very close to goal of less than 70. -At prior visit gave her nutritional information.  Essential hypertension - Stable, continue with current medical management -Amlodipine 5 mg once a day losartan 100 mg once a day hydrochlorothiazide 25 mg a day metoprolol 25 mg a day isosorbide 30 mg a day furosemide 20 mg as needed.  Has not needed to use Lasix in quite some time.  -Creatinine 0.97 potassium 3.2  Mitral valve prolapse - Anterior leaflet trivial prolapse.  Trivial regurgitation.  Normal EF.  Reviewed echocardiogram with her.  Excellent.  Fatigue/daytime somnolence/ OSA - Dr. Radford Pax has been following. CPAP, nasal pillows.  Overall doing well.  Shortness of breath - Dr. Lamonte Sakai has seen in the past with pulmonary.  Prior testing unremarkable.  She is on a Breo inhaler. In January has some issues. PRN visit. Last visit November 27 2021.  Doing much better.  Pre op knee  -Upcoming potential knee surgery.  Dr. Charlynne Pander. she may proceed from a cardiac perspective with low to moderate overall cardiac risk.  Doing well.  Able to achieve greater than 4 METS without any difficulty.       Medication Adjustments/Labs and Tests Ordered: Current medicines are reviewed at length with the patient today.  Concerns regarding medicines are outlined above.  No orders of the defined types were placed in this encounter.  No orders of  the defined types were placed in this encounter.   Patient Instructions  Medication Instructions:  The current medical regimen is effective;  continue present plan and medications.  *If you need a refill on your cardiac medications before your next appointment, please call your pharmacy*  Follow-Up: At [facility], you and your health needs are our priority.  As part of our continuing mission to provide you with exceptional heart care, we have created designated Provider Care Teams.  These Care Teams include your primary Cardiologist (physician) and Advanced Practice Providers (APPs -  Physician Assistants and Nurse Practitioners) who all work together to provide you with the care you need, when you need it.  We recommend signing up for the patient portal called "MyChart".  Sign up information is provided on this After Visit Summary.  MyChart is used to connect with patients for Virtual Visits (Telemedicine).  Patients are able to view lab/test results, encounter notes, upcoming appointments, etc.  Non-urgent messages can be sent to your provider as well.   To learn more about what you can do with MyChart, go to [email or web address].    Your next appointment:   1 year(s)  The format for your next appointment:   In  Person  Provider:   Candee Furbish, MD {   Important Information About Sugar         Signed, Candee Furbish, MD  03/15/2022 8:47 AM    [place]

## 2022-03-23 ENCOUNTER — Telehealth: Payer: Self-pay | Admitting: *Deleted

## 2022-03-23 NOTE — Telephone Encounter (Signed)
   Pre-operative Risk Assessment    Patient Name: Hannah Vasquez  DOB: 04-18-1951 MRN: [id number]      Request for Surgical Clearance    Procedure:   LEFT TOTAL KNEE REPLACEMENT  Date of Surgery:  Clearance TBD                                 Surgeon:  Earlie Server, MD Surgeon's Group or Practice Name:  [facility] Phone number:  [phone] Fax number:  [phone]  ATTN:  KELLY HIGH   Type of Clearance Requested:   - Medical  - Pharmacy:  Hold Aspirin NOT INDICATED   Type of Anesthesia:   CHOICE   Additional requests/questions:    Astrid Divine   03/23/2022, 8:51 AM

## 2022-03-23 NOTE — Telephone Encounter (Signed)
   Primary Cardiologist: Candee Furbish, MD  Chart reviewed as part of pre-operative protocol coverage. Given past medical history and time since last visit, based on ACC/AHA guidelines, IMUNIQUE SAMAD would be at acceptable risk for the planned procedure without further cardiovascular testing. She was evaluated by Dr. Marlou Porch in the office on 03/15/22.   I will route this recommendation to the requesting party via Epic fax function and remove from pre-op pool.  Please call with questions.  Emmaline Life, NP-C    03/23/2022, 9:14 AM [place] [address] Office [phone] Fax [phone]

## 2022-03-29 ENCOUNTER — Other Ambulatory Visit: Payer: Self-pay | Admitting: Family Medicine

## 2022-03-29 DIAGNOSIS — M5416 Radiculopathy, lumbar region: Secondary | ICD-10-CM

## 2022-03-31 ENCOUNTER — Other Ambulatory Visit: Payer: Self-pay | Admitting: Family Medicine

## 2022-03-31 ENCOUNTER — Other Ambulatory Visit (INDEPENDENT_AMBULATORY_CARE_PROVIDER_SITE_OTHER): Payer: PPO

## 2022-03-31 DIAGNOSIS — K7581 Nonalcoholic steatohepatitis (NASH): Secondary | ICD-10-CM | POA: Diagnosis not present

## 2022-03-31 DIAGNOSIS — M5416 Radiculopathy, lumbar region: Secondary | ICD-10-CM

## 2022-03-31 LAB — CBC WITH DIFFERENTIAL/PLATELET
Basophils Absolute: 0.1 10*3/uL (ref 0.0–0.1)
Basophils Relative: 0.5 % (ref 0.0–3.0)
Eosinophils Absolute: 0.3 10*3/uL (ref 0.0–0.7)
Eosinophils Relative: 2.8 % (ref 0.0–5.0)
HCT: 41.6 % (ref 36.0–46.0)
Hemoglobin: 13.6 g/dL (ref 12.0–15.0)
Lymphocytes Relative: 37.5 % (ref 12.0–46.0)
Lymphs Abs: 3.8 10*3/uL (ref 0.7–4.0)
MCHC: 32.8 g/dL (ref 30.0–36.0)
MCV: 83.6 fl (ref 78.0–100.0)
Monocytes Absolute: 0.5 10*3/uL (ref 0.1–1.0)
Monocytes Relative: 4.6 % (ref 3.0–12.0)
Neutro Abs: 5.5 10*3/uL (ref 1.4–7.7)
Neutrophils Relative %: 54.6 % (ref 43.0–77.0)
Platelets: 302 10*3/uL (ref 150.0–400.0)
RBC: 4.98 Mil/uL (ref 3.87–5.11)
RDW: 15.1 % (ref 11.5–15.5)
WBC: 10 10*3/uL (ref 4.0–10.5)

## 2022-03-31 LAB — COMPREHENSIVE METABOLIC PANEL
ALT: 19 U/L (ref 0–35)
AST: 19 U/L (ref 0–37)
Albumin: 4.1 g/dL (ref 3.5–5.2)
Alkaline Phosphatase: 143 U/L — ABNORMAL HIGH (ref 39–117)
BUN: 11 mg/dL (ref 6–23)
CO2: 29 mEq/L (ref 19–32)
Calcium: 9.7 mg/dL (ref 8.4–10.5)
Chloride: 101 mEq/L (ref 96–112)
Creatinine, Ser: 0.87 mg/dL (ref 0.40–1.20)
GFR: 66.06 mL/min (ref >60.00)
Glucose, Bld: 146 mg/dL — ABNORMAL HIGH (ref 70–99)
Potassium: 3 mEq/L — ABNORMAL LOW (ref 3.5–5.1)
Sodium: 140 mEq/L (ref 135–145)
Total Bilirubin: 0.6 mg/dL (ref 0.2–1.2)
Total Protein: 7.5 g/dL (ref 6.0–8.3)

## 2022-03-31 LAB — PROTIME-INR
INR: 1 ratio (ref 0.8–1.0)
Prothrombin Time: 11 s (ref 9.6–13.1)

## 2022-04-01 ENCOUNTER — Telehealth: Payer: Self-pay | Admitting: Family Medicine

## 2022-04-01 ENCOUNTER — Telehealth: Payer: Self-pay | Admitting: *Deleted

## 2022-04-01 ENCOUNTER — Other Ambulatory Visit: Payer: Self-pay

## 2022-04-01 DIAGNOSIS — E876 Hypokalemia: Secondary | ICD-10-CM

## 2022-04-01 DIAGNOSIS — I10 Essential (primary) hypertension: Secondary | ICD-10-CM

## 2022-04-01 DIAGNOSIS — K7581 Nonalcoholic steatohepatitis (NASH): Secondary | ICD-10-CM

## 2022-04-01 DIAGNOSIS — Z79899 Other long term (current) drug therapy: Secondary | ICD-10-CM

## 2022-04-01 DIAGNOSIS — M5416 Radiculopathy, lumbar region: Secondary | ICD-10-CM

## 2022-04-01 MED ORDER — TRAMADOL HCL 50 MG PO TABS: 50.0000 mg | ORAL_TABLET | Freq: Four times a day (QID) | ORAL | 0 refills | Status: DC | PRN

## 2022-04-01 MED ORDER — SPIRONOLACTONE 25 MG PO TABS: 25.0000 mg | ORAL_TABLET | Freq: Every day | ORAL | 3 refills | Status: DC

## 2022-04-01 NOTE — Telephone Encounter (Signed)
Spoke with patient. Patient states she already takes Potassium 2 pills 2 times daily and has been taking this regimen for 3 to 4 years. Lab result note from Dr Einar Pheasant stated to take 2 pills daily for 5 days. She wanted to get clarification on that if she is adding 2 extra pills to what she is already taking?

## 2022-04-01 NOTE — Telephone Encounter (Signed)
Last refill: 12/22/21 #90 with 2 Last OV: 11/18/21 MWV Next OV: 04/06/22 surg. Clearance

## 2022-04-01 NOTE — Telephone Encounter (Signed)
Patient called in returning a call she received. She had more questions regarding potassium. Thank you!

## 2022-04-01 NOTE — Telephone Encounter (Signed)
Because of her low potassium, lets try stopping the HCTZ 25 mg and starting spironolactone 25 mg once a day.  Repeat basic metabolic profile in 2 weeks.  Continue to monitor blood pressures at home.   Candee Furbish, MD          Pt is aware of orders per Dr Marlou Porch.  RX sent into [facility] at pt's request.  Lab (BMP) ordered and scheduled for 04/16/22.

## 2022-04-02 NOTE — Telephone Encounter (Signed)
Spoke to pt and relayed message.

## 2022-04-02 NOTE — Telephone Encounter (Signed)
Noted.   Please call and advise patient that she should continue her current potassium regimen.   Cardiology has reached out to her to make changes to medications and plan for recheck labs.   Continue with cardiology plan and sorry for the confusion

## 2022-04-06 ENCOUNTER — Ambulatory Visit: Payer: PPO | Admitting: Family Medicine

## 2022-04-07 ENCOUNTER — Ambulatory Visit
Admission: EM | Admit: 2022-04-07 | Discharge: 2022-04-07 | Disposition: A | Discharge status: Home / Self Care | Payer: PPO | Attending: Emergency Medicine | Admitting: Emergency Medicine

## 2022-04-07 DIAGNOSIS — S81832A Puncture wound without foreign body, left lower leg, initial encounter: Secondary | ICD-10-CM | POA: Diagnosis not present

## 2022-04-07 DIAGNOSIS — W57XXXA Bitten or stung by nonvenomous insect and other nonvenomous arthropods, initial encounter: Secondary | ICD-10-CM | POA: Diagnosis not present

## 2022-04-07 MED ORDER — CEPHALEXIN 500 MG PO CAPS
500.0000 mg | ORAL_CAPSULE | Freq: Two times a day (BID) | ORAL | 0 refills | Status: AC
Start: 2022-04-07 — End: 2022-04-12

## 2022-04-07 MED ORDER — KETOROLAC TROMETHAMINE 30 MG/ML IJ SOLN
30.0000 mg | Freq: Once | INTRAMUSCULAR | Status: AC
Start: 2022-04-07 — End: 2022-04-07
  Administered 2022-04-07: 30 mg via INTRAMUSCULAR

## 2022-04-07 NOTE — ED Triage Notes (Signed)
Pt presents with bee sting to left lower leg that happened on Monday .pt reports drainage and increased redness

## 2022-04-07 NOTE — Discharge Instructions (Addendum)
Today you are being treated for an infection at the site of the bug bite  Begin Keflex every morning and every evening for the next 5 days, you will begin to see improvement day by day, if you have any reaction/intolerance to this medication please let urgent care know as soon as possible so that your medication may be changed  Been given an injection of Toradol which is to reduce inflammation and help with pain.  Office today  You may place ice or heat over the affected area in 10 to 15-minute intervals for general comfort and to help facilitate drainage  You may elevate your leg when sitting and lying down to reduce any swelling  You may take Tylenol 500 mg every 6 hours for pain in addition to your normal daily medications  If your symptoms persist past use of medication or worsen at any point please follow-up primary care or urgent care for reevaluation of site

## 2022-04-07 NOTE — ED Provider Notes (Signed)
[facility]    CSN: [id number] Arrival date & time: 04/07/22  0800      History   Chief Complaint Chief Complaint  Patient presents with   Insect Bite    HPI Hannah Vasquez is a 71 y.o. female.   Patient presents for evaluation for infection at the site of a wasp/bee sting that occurred 3 days ago.  Site is erythematous and painful.  Initially had a pimple-like center that she drained and was able to expel Hannah Vasquez to yellow puslike drainage.  A circle around the site 1 day ago and erythema has spread beyond that today.  Has attempted use of Tylenol for pain.  Denies fever or chills.  Past Medical History:  Diagnosis Date   Allergy    Anxiety    CAD (coronary artery disease)    Cataract    Does use hearing aid    bilateral as of 10/05/2019   Elevated alkaline phosphatase level    Gallstones    Hemorrhoids    Hepatic steatosis    History of MI (myocardial infarction) 10/07/2017   Hypertension    Mitral valve prolapse    Osteoporosis    Sarcoidosis    Status post dilation of esophageal narrowing     Patient Active Problem List   Diagnosis Date Noted   Left-sided face pain 11/18/2021   Dizziness, nonspecific 11/03/2021   Decreased grip strength of left hand 11/03/2021   Sarcoidosis 09/10/2021   Thyroid nodule 09/10/2021   Urinary urgency 05/21/2021   Vitamin B12 deficiency 01/29/2021   Cough 07/29/2020   OSA (obstructive sleep apnea) 06/25/2020   Early satiety 04/29/2020   Upper abdominal pain 04/29/2020   Nausea without vomiting 04/29/2020   Prediabetes 03/25/2020   Nocturia 03/25/2020   Insomnia 03/25/2020   Reactive airway disease 12/12/2019   Neuropathy 56/43/3295   Chronic systolic heart failure ([place]) 07/11/2019   Elevated serum alkaline phosphatase level 07/10/2019   Leukocytosis 07/10/2019   Bilateral hand pain 04/09/2019   Bilateral hearing loss 04/09/2019   Trigger finger, left middle finger 10/30/2018   Lumbar back pain with radiculopathy  affecting lower extremity 05/22/2018   Osteopenia 05/15/2018   Lumbar radiculopathy 04/27/2018   GERD (gastroesophageal reflux disease) 04/10/2018   Shortness of breath 08/21/2017   DDD (degenerative disc disease), thoracolumbar 04/28/2017   Back pain 04/21/2017   Hypokalemia 01/06/2017   History of non-ST elevation myocardial infarction (NSTEMI) 01/01/2017   Fatigue 11/27/2014   RBBB 12/19/2013   Mitral valve prolapse    HYPERTENSION, BENIGN 08/27/2010   Anxiety state 06/27/2009   Coronary atherosclerosis 06/27/2009   Mitral valve disorder 06/27/2009   HEMORRHOIDS 06/27/2009   Allergic rhinitis 06/27/2009   Disorder of bone and cartilage 11/11/2006    Past Surgical History:  Procedure Laterality Date   BASAL CELL CARCINOMA EXCISION  02/05/2016   Dr. Sarajane Jews, [facility]   BLADDER SURGERY  11/15/1995   bladder neck suspension, urinary incontinence   CARDIAC CATHETERIZATION  01/03/2017 DES LAD   CATARACT EXTRACTION Left 04/30/2016   Dr. Tommy Rainwater, [place] N/A 01/03/2017   Procedure: Coronary Stent Intervention;  Surgeon: Nelva Bush, MD;  Location: [place] [facility];  Service: Cardiovascular;  Laterality: N/A;   DENTAL SURGERY  06/20/2016   Tooth Implant; Dr. Kalman Shan   INTRAVASCULAR ULTRASOUND/IVUS N/A 01/03/2017   Procedure: Intravascular Ultrasound/IVUS;  Surgeon: Nelva Bush, MD;  Location: [place];  Service: Cardiovascular;  Laterality: N/A;   KNEE  SURGERY  2003   torn meniscus   LEFT HEART CATH AND CORONARY ANGIOGRAPHY N/A 01/03/2017   Procedure: Left Heart Cath and Coronary Angiography;  Surgeon: Nelva Bush, MD;  Location: [place] [facility];  Service: Cardiovascular;  Laterality: N/A;   MOHS SURGERY Right 2017   right side of face   ROTATOR CUFF REPAIR     SHOULDER SURGERY Right 2005    OB History     Gravida  2   Para  2   Term  2   Preterm      AB      Living  2      SAB      IAB       Ectopic      Multiple      Live Births  2            Home Medications    Prior to Admission medications   Medication Sig Start Date End Date Taking? Authorizing Provider  cephALEXin (KEFLEX) 500 MG capsule Take 1 capsule (500 mg total) by mouth 2 (two) times daily for 5 days. 04/07/22 04/12/22 Yes Jadarius Commons, Leitha Schuller, NP  acetaminophen (TYLENOL) 500 MG tablet Take 500 mg by mouth every 6 (six) hours as needed for moderate pain.    [provider]  albuterol (VENTOLIN HFA) 108 (90 Base) MCG/ACT inhaler Inhale 1 puff into the lungs every 6 (six) hours as needed for wheezing or shortness of breath. 12/12/19   Lesleigh Noe, MD  amLODipine (NORVASC) 5 MG tablet Take 1 tablet (5 mg total) by mouth daily. 12/08/21   Lesleigh Noe, MD  aspirin EC 81 MG tablet Take 81 mg by mouth every morning.     [provider]  atorvastatin (LIPITOR) 80 MG tablet Take 1 tablet (80 mg total) by mouth daily at 6 PM. 07/27/21   Jerline Pain, MD  Calcium-Magnesium-Vitamin D (CALCIUM 1200+D3 PO) Take 1,200 mg by mouth daily.    [provider]  fluticasone (FLONASE) 50 MCG/ACT nasal spray Place 1 spray into both nostrils daily as needed for allergies.    [provider]  furosemide (LASIX) 20 MG tablet Take 1 tablet (20 mg total) by mouth daily as needed. 01/01/19   Jerline Pain, MD  gabapentin (NEURONTIN) 300 MG capsule Take 1 capsule (300 mg total) by mouth 3 (three) times daily. 05/21/21   Lesleigh Noe, MD  isosorbide mononitrate (IMDUR) 30 MG 24 hr tablet Take 1 tablet (30 mg total) by mouth daily. 12/08/21   Lesleigh Noe, MD  loratadine (CLARITIN) 10 MG tablet Take 1 tablet (10 mg total) by mouth every morning. 11/08/13   Lucille Passy, MD  losartan (COZAAR) 100 MG tablet TAKE 1 TABLET BY MOUTH ONCE A DAY 06/16/21   Jerline Pain, MD  meloxicam (MOBIC) 7.5 MG tablet Take 1 tablet (7.5 mg total) by mouth daily. 11/18/21   Lesleigh Noe, MD  metoprolol succinate  (TOPROL-XL) 25 MG 24 hr tablet Take 1 tablet (25 mg total) by mouth daily. 12/08/21   Lesleigh Noe, MD  Multiple Vitamin (MULTIVITAMIN) tablet Take 1 tablet by mouth every morning.     [provider]  MYRBETRIQ 50 MG TB24 tablet Take 50 mg by mouth daily. 09/28/21   [provider]  nitroGLYCERIN (NITROSTAT) 0.4 MG SL tablet Place 1 tablet (0.4 mg total) under the tongue every 5 (five) minutes as needed for chest pain. 06/23/21  Jerline Pain, MD  Omega-3 Fatty Acids (FISH OIL) 1200 MG CAPS Take 1,200 mg by mouth every morning.    [provider]  omeprazole (PRILOSEC) 40 MG capsule Take 1 capsule (40 mg total) by mouth 2 (two) times daily. 12/21/21   Pyrtle, Lajuan Lines, MD  potassium chloride SA (KLOR-CON M) 20 MEQ tablet TAKE 2 TABLETS BY MOUTH TWICE DAILY 03/01/22   Jerline Pain, MD  spironolactone (ALDACTONE) 25 MG tablet Take 1 tablet (25 mg total) by mouth daily. 04/01/22   Jerline Pain, MD  tiZANidine (ZANAFLEX) 4 MG tablet Take by mouth. 03/01/22   [provider]  traMADol (ULTRAM) 50 MG tablet Take 1 tablet (50 mg total) by mouth every 6 (six) hours as needed for severe pain. 04/01/22   Lesleigh Noe, MD  vitamin E 180 MG (400 UNITS) capsule Take 800 Units by mouth in the morning and at bedtime.    [provider]    Family History Family History  Problem Relation Age of Onset   Mitral valve prolapse Mother    Hypertension Mother    Hypertension Father    Brain cancer Father    Colon cancer Neg Hx    Stomach cancer Neg Hx    Esophageal cancer Neg Hx    Pancreatic cancer Neg Hx     Social History Social History   Tobacco Use   Smoking status: Never   Smokeless tobacco: Never  Vaping Use   Vaping Use: Never used  Substance Use Topics   Alcohol use: No    Alcohol/week: 0.0 standard drinks of alcohol   Drug use: No     Allergies   Promethazine-dm, Bactroban [mupirocin calcium], Doxycycline, Codeine, Valtrex [valacyclovir hcl],  and Penicillins   Review of Systems Review of Systems  Constitutional: Negative.   Respiratory: Negative.    Cardiovascular: Negative.   Skin:  Positive for wound. Negative for color change, pallor and rash.  Neurological: Negative.      Physical Exam Triage Vital Signs ED Triage Vitals  Enc Vitals Group     BP 04/07/22 0808 (!) 152/78     Pulse Rate 04/07/22 0808 66     Resp 04/07/22 0808 18     Temp 04/07/22 0808 (!) 97.5 F (36.4 C)     Temp src --      SpO2 04/07/22 0808 96 %     Weight --      Height --      Head Circumference --      Peak Flow --      Pain Score 04/07/22 0807 6     Pain Loc --      Pain Edu? --      Excl. in [place]? --    No data found.  Updated Vital Signs BP (!) 152/78   Pulse 66   Temp (!) 97.5 F (36.4 C)   Resp 18   LMP  (LMP Unknown)   SpO2 96%   Visual Acuity Right Eye Distance:   Left Eye Distance:   Bilateral Distance:    Right Eye Near:   Left Eye Near:    Bilateral Near:     Physical Exam Constitutional:      Appearance: Normal appearance.  HENT:     Head: Normocephalic.  Eyes:     Extraocular Movements: Extraocular movements intact.  Pulmonary:     Effort: Pulmonary effort is normal.  Skin:    Comments: Less than 0.5 cm  wound to the anterior left lower extremity with surrounding erythema, tenderness and warmth, unable to expel drainage with manual pressure   Neurological:     Mental Status: She is alert and oriented to person, place, and time. Mental status is at baseline.  Psychiatric:        Mood and Affect: Mood normal.        Behavior: Behavior normal.      UC Treatments / Results  Labs (all labs ordered are listed, but only abnormal results are displayed) Labs Reviewed - No data to display  EKG   Radiology No results found.  Procedures Procedures (including critical care time)  Medications Ordered in UC Medications  ketorolac (TORADOL) 30 MG/ML injection 30 mg (has no administration in time  range)    Initial Impression / Assessment and Plan / UC Course  I have reviewed the triage vital signs and the nursing notes.  Pertinent labs & imaging results that were available during my care of the patient were reviewed by me and considered in my medical decision making (see chart for details).  Bug bite with infection, initial encounter  On presentation area appears to be infected, discussed with patient, Keflex prescribed, allergy to penicillins and doxycycline confirmed, given Toradol injection in office for pain and recommended continued use of over-the-counter Tylenol outpatient, may use ice and heat as well as elevation for additional supportive measures, given strict precautions that if symptoms persist past use of medication or worsen at any point she is to follow-up with PCP or urgent care for reevaluation, verbalized understanding Final Clinical Impressions(s) / UC Diagnoses   Final diagnoses:  Bug bite with infection, initial encounter     Discharge Instructions      Today you are being treated for an infection at the site of the bug bite  Begin Keflex every morning and every evening for the next 5 days, you will begin to see improvement day by day, if you have any reaction/intolerance to this medication please let urgent care know as soon as possible so that your medication may be changed  Been given an injection of Toradol which is to reduce inflammation and help with pain.  Office today  You may place ice or heat over the affected area in 10 to 15-minute intervals for general comfort and to help facilitate drainage  You may elevate your leg when sitting and lying down to reduce any swelling  You may take Tylenol 500 mg every 6 hours for pain in addition to your normal daily medications  If your symptoms persist past use of medication or worsen at any point please follow-up primary care or urgent care for reevaluation of site   ED Prescriptions     Medication  Sig Dispense Auth. Provider   cephALEXin (KEFLEX) 500 MG capsule Take 1 capsule (500 mg total) by mouth 2 (two) times daily for 5 days. 10 capsule Hans Eden, NP      PDMP not reviewed this encounter.   Hans Eden, [place] 04/07/22 [phone]

## 2022-04-14 ENCOUNTER — Ambulatory Visit
Admission: EM | Admit: 2022-04-14 | Discharge: 2022-04-14 | Disposition: A | Discharge status: Home / Self Care | Payer: PPO | Attending: Emergency Medicine | Admitting: Emergency Medicine

## 2022-04-14 DIAGNOSIS — S80862D Insect bite (nonvenomous), left lower leg, subsequent encounter: Secondary | ICD-10-CM

## 2022-04-14 DIAGNOSIS — W57XXXD Bitten or stung by nonvenomous insect and other nonvenomous arthropods, subsequent encounter: Secondary | ICD-10-CM | POA: Diagnosis not present

## 2022-04-14 DIAGNOSIS — L03116 Cellulitis of left lower limb: Secondary | ICD-10-CM | POA: Diagnosis not present

## 2022-04-14 MED ORDER — SULFAMETHOXAZOLE-TRIMETHOPRIM 800-160 MG PO TABS: 1.0000 | ORAL_TABLET | Freq: Two times a day (BID) | ORAL | 0 refills | Status: AC

## 2022-04-14 NOTE — ED Provider Notes (Signed)
[facility]    CSN: [id number] Arrival date & time: 04/14/22  0801      History   Chief Complaint Chief Complaint  Patient presents with   Insect Bite    HPI Hannah Vasquez is a 71 y.o. female.  Patient presents with tender redness due to an insect bite or sting on her left lower leg that occurred 10 days ago. Patient was seen at this urgent care on 8/232023; diagnosed with puncture wound of left lower extremity and bug bite with infection; treated with cephalexin.  She completed the cephalexin.  The area continues to be red and tender.  No fever, chills, red streaks, numbness, weakness, or other symptoms.  Her medical history includes hypertension.  The history is provided by the patient and medical records.    Past Medical History:  Diagnosis Date   Allergy    Anxiety    CAD (coronary artery disease)    Cataract    Does use hearing aid    bilateral as of 10/05/2019   Elevated alkaline phosphatase level    Gallstones    Hemorrhoids    Hepatic steatosis    History of MI (myocardial infarction) 10/07/2017   Hypertension    Mitral valve prolapse    Osteoporosis    Sarcoidosis    Status post dilation of esophageal narrowing     Patient Active Problem List   Diagnosis Date Noted   Left-sided face pain 11/18/2021   Dizziness, nonspecific 11/03/2021   Decreased grip strength of left hand 11/03/2021   Sarcoidosis 09/10/2021   Thyroid nodule 09/10/2021   Urinary urgency 05/21/2021   Vitamin B12 deficiency 01/29/2021   Cough 07/29/2020   OSA (obstructive sleep apnea) 06/25/2020   Early satiety 04/29/2020   Upper abdominal pain 04/29/2020   Nausea without vomiting 04/29/2020   Prediabetes 03/25/2020   Nocturia 03/25/2020   Insomnia 03/25/2020   Reactive airway disease 12/12/2019   Neuropathy 27/10/5007   Chronic systolic heart failure ([place]) 07/11/2019   Elevated serum alkaline phosphatase level 07/10/2019   Leukocytosis 07/10/2019   Bilateral hand pain  04/09/2019   Bilateral hearing loss 04/09/2019   Trigger finger, left middle finger 10/30/2018   Lumbar back pain with radiculopathy affecting lower extremity 05/22/2018   Osteopenia 05/15/2018   Lumbar radiculopathy 04/27/2018   GERD (gastroesophageal reflux disease) 04/10/2018   Shortness of breath 08/21/2017   DDD (degenerative disc disease), thoracolumbar 04/28/2017   Back pain 04/21/2017   Hypokalemia 01/06/2017   History of non-ST elevation myocardial infarction (NSTEMI) 01/01/2017   Fatigue 11/27/2014   RBBB 12/19/2013   Mitral valve prolapse    HYPERTENSION, BENIGN 08/27/2010   Anxiety state 06/27/2009   Coronary atherosclerosis 06/27/2009   Mitral valve disorder 06/27/2009   HEMORRHOIDS 06/27/2009   Allergic rhinitis 06/27/2009   Disorder of bone and cartilage 11/11/2006    Past Surgical History:  Procedure Laterality Date   BASAL CELL CARCINOMA EXCISION  02/05/2016   Dr. Sarajane Jews, [facility]   BLADDER SURGERY  11/15/1995   bladder neck suspension, urinary incontinence   CARDIAC CATHETERIZATION  01/03/2017 DES LAD   CATARACT EXTRACTION Left 04/30/2016   Dr. Tommy Rainwater, [place] N/A 01/03/2017   Procedure: Coronary Stent Intervention;  Surgeon: Nelva Bush, MD;  Location: [place] [facility];  Service: Cardiovascular;  Laterality: N/A;   DENTAL SURGERY  06/20/2016   Tooth Implant; Dr. Kalman Shan   INTRAVASCULAR ULTRASOUND/IVUS N/A 01/03/2017   Procedure: Intravascular Ultrasound/IVUS;  Surgeon:  End, Harrell Gave, MD;  Location: [place] [facility];  Service: Cardiovascular;  Laterality: N/A;   KNEE SURGERY  2003   torn meniscus   LEFT HEART CATH AND CORONARY ANGIOGRAPHY N/A 01/03/2017   Procedure: Left Heart Cath and Coronary Angiography;  Surgeon: Nelva Bush, MD;  Location: [place] [facility];  Service: Cardiovascular;  Laterality: N/A;   MOHS SURGERY Right 2017   right side of face   ROTATOR CUFF REPAIR     SHOULDER  SURGERY Right 2005    OB History     Gravida  2   Para  2   Term  2   Preterm      AB      Living  2      SAB      IAB      Ectopic      Multiple      Live Births  2            Home Medications    Prior to Admission medications   Medication Sig Start Date End Date Taking? Authorizing Provider  sulfamethoxazole-trimethoprim (BACTRIM DS) 800-160 MG tablet Take 1 tablet by mouth 2 (two) times daily for 7 days. 04/14/22 04/21/22 Yes Sharion Balloon, NP  acetaminophen (TYLENOL) 500 MG tablet Take 500 mg by mouth every 6 (six) hours as needed for moderate pain.    [provider]  albuterol (VENTOLIN HFA) 108 (90 Base) MCG/ACT inhaler Inhale 1 puff into the lungs every 6 (six) hours as needed for wheezing or shortness of breath. 12/12/19   Lesleigh Noe, MD  amLODipine (NORVASC) 5 MG tablet Take 1 tablet (5 mg total) by mouth daily. 12/08/21   Lesleigh Noe, MD  aspirin EC 81 MG tablet Take 81 mg by mouth every morning.     [provider]  atorvastatin (LIPITOR) 80 MG tablet Take 1 tablet (80 mg total) by mouth daily at 6 PM. 07/27/21   Jerline Pain, MD  Calcium-Magnesium-Vitamin D (CALCIUM 1200+D3 PO) Take 1,200 mg by mouth daily.    [provider]  fluticasone (FLONASE) 50 MCG/ACT nasal spray Place 1 spray into both nostrils daily as needed for allergies.    [provider]  furosemide (LASIX) 20 MG tablet Take 1 tablet (20 mg total) by mouth daily as needed. 01/01/19   Jerline Pain, MD  gabapentin (NEURONTIN) 300 MG capsule Take 1 capsule (300 mg total) by mouth 3 (three) times daily. 05/21/21   Lesleigh Noe, MD  isosorbide mononitrate (IMDUR) 30 MG 24 hr tablet Take 1 tablet (30 mg total) by mouth daily. 12/08/21   Lesleigh Noe, MD  loratadine (CLARITIN) 10 MG tablet Take 1 tablet (10 mg total) by mouth every morning. 11/08/13   Lucille Passy, MD  losartan (COZAAR) 100 MG tablet TAKE 1 TABLET BY MOUTH ONCE A DAY 06/16/21    Jerline Pain, MD  meloxicam (MOBIC) 7.5 MG tablet Take 1 tablet (7.5 mg total) by mouth daily. 11/18/21   Lesleigh Noe, MD  metoprolol succinate (TOPROL-XL) 25 MG 24 hr tablet Take 1 tablet (25 mg total) by mouth daily. 12/08/21   Lesleigh Noe, MD  Multiple Vitamin (MULTIVITAMIN) tablet Take 1 tablet by mouth every morning.     [provider]  MYRBETRIQ 50 MG TB24 tablet Take 50 mg by mouth daily. 09/28/21   [provider]  nitroGLYCERIN (NITROSTAT) 0.4 MG SL tablet Place 1 tablet (0.4 mg  total) under the tongue every 5 (five) minutes as needed for chest pain. 06/23/21   Jerline Pain, MD  Omega-3 Fatty Acids (FISH OIL) 1200 MG CAPS Take 1,200 mg by mouth every morning.    [provider]  omeprazole (PRILOSEC) 40 MG capsule Take 1 capsule (40 mg total) by mouth 2 (two) times daily. 12/21/21   Pyrtle, Lajuan Lines, MD  potassium chloride SA (KLOR-CON M) 20 MEQ tablet TAKE 2 TABLETS BY MOUTH TWICE DAILY 03/01/22   Jerline Pain, MD  spironolactone (ALDACTONE) 25 MG tablet Take 1 tablet (25 mg total) by mouth daily. 04/01/22   Jerline Pain, MD  tiZANidine (ZANAFLEX) 4 MG tablet Take by mouth. 03/01/22   [provider]  traMADol (ULTRAM) 50 MG tablet Take 1 tablet (50 mg total) by mouth every 6 (six) hours as needed for severe pain. 04/01/22   Lesleigh Noe, MD  vitamin E 180 MG (400 UNITS) capsule Take 800 Units by mouth in the morning and at bedtime.    [provider]    Family History Family History  Problem Relation Age of Onset   Mitral valve prolapse Mother    Hypertension Mother    Hypertension Father    Brain cancer Father    Colon cancer Neg Hx    Stomach cancer Neg Hx    Esophageal cancer Neg Hx    Pancreatic cancer Neg Hx     Social History Social History   Tobacco Use   Smoking status: Never   Smokeless tobacco: Never  Vaping Use   Vaping Use: Never used  Substance Use Topics   Alcohol use: No    Alcohol/week: 0.0 standard  drinks of alcohol   Drug use: No     Allergies   Promethazine-dm, Bactroban [mupirocin calcium], Doxycycline, Codeine, Valtrex [valacyclovir hcl], and Penicillins   Review of Systems Review of Systems  Constitutional:  Negative for chills and fever.  Musculoskeletal:  Negative for arthralgias, gait problem and joint swelling.  Skin:  Positive for color change and wound.  Neurological:  Negative for weakness and numbness.  All other systems reviewed and are negative.    Physical Exam Triage Vital Signs ED Triage Vitals  Enc Vitals Group     BP      Pulse      Resp      Temp      Temp src      SpO2      Weight      Height      Head Circumference      Peak Flow      Pain Score      Pain Loc      Pain Edu?      Excl. in [place]?    No data found.  Updated Vital Signs BP (!) 155/81   Pulse 76   Temp 97.9 F (36.6 C)   Resp 18   Ht 5' 2"  (1.575 m)   Wt 165 lb 5.5 oz (75 kg)   LMP  (LMP Unknown)   SpO2 98%   BMI 30.24 kg/m   Visual Acuity Right Eye Distance:   Left Eye Distance:   Bilateral Distance:    Right Eye Near:   Left Eye Near:    Bilateral Near:     Physical Exam Vitals and nursing note reviewed.  Constitutional:      General: She is not in acute distress.    Appearance: Normal appearance. She is  well-developed. She is not ill-appearing.  HENT:     Mouth/Throat:     Mouth: Mucous membranes are moist.  Cardiovascular:     Rate and Rhythm: Normal rate and regular rhythm.  Pulmonary:     Effort: Pulmonary effort is normal. No respiratory distress.  Musculoskeletal:        General: No swelling. Normal range of motion.     Cervical back: Neck supple.  Skin:    General: Skin is warm and dry.     Capillary Refill: Capillary refill takes less than 2 seconds.     Findings: Erythema and lesion present.     Comments: LLE: erythema with central lesion.  See picture for details.   Neurological:     Mental Status: She is alert and oriented to person,  place, and time.     Sensory: No sensory deficit.     Motor: No weakness.     Gait: Gait normal.  Psychiatric:        Mood and Affect: Mood normal.        Behavior: Behavior normal.       UC Treatments / Results  Labs (all labs ordered are listed, but only abnormal results are displayed) Labs Reviewed - No data to display  EKG   Radiology No results found.  Procedures Procedures (including critical care time)  Medications Ordered in UC Medications - No data to display  Initial Impression / Assessment and Plan / UC Course  I have reviewed the triage vital signs and the nursing notes.  Pertinent labs & imaging results that were available during my care of the patient were reviewed by me and considered in my medical decision making (see chart for details).   Cellulitis of left lower leg due to insect bite.  Patient completed cephalexin.  Treating today with Bactrim.  Wound care instructions and signs of worsening infection discussed.  Instructed patient to follow-up with her PCP for recheck in 2 to 3 days.  ED precautions discussed.  Education provided on cellulitis and insect bites.  Patient agrees to plan of care.   Final Clinical Impressions(s) / UC Diagnoses   Final diagnoses:  Cellulitis of leg, left  Insect bite of left lower leg, subsequent encounter     Discharge Instructions      Take the Bactrim as directed.  Follow up with your primary care provider in 2-3 days for a recheck.        ED Prescriptions     Medication Sig Dispense Auth. Provider   sulfamethoxazole-trimethoprim (BACTRIM DS) 800-160 MG tablet Take 1 tablet by mouth 2 (two) times daily for 7 days. 14 tablet Sharion Balloon, NP      PDMP not reviewed this encounter.   Sharion Balloon, NP 04/14/22 [phone]

## 2022-04-14 NOTE — ED Triage Notes (Signed)
Patient to Urgent Care for re-evaluation of a bee/wasp sting that occurred on 8/20. Area present to left ankle. Reports area is improved but she is still concerned about the redness and swelling. Area is still very sensitive and sore. Denies any further drainage.   Compliant with prescribed Keflex (completed on Sunday).

## 2022-04-14 NOTE — Discharge Instructions (Addendum)
Take the Bactrim as directed.  Follow up with your primary care provider in 2-3 days for a recheck.

## 2022-04-16 ENCOUNTER — Ambulatory Visit: Payer: PPO | Attending: Cardiology

## 2022-04-16 DIAGNOSIS — Z79899 Other long term (current) drug therapy: Secondary | ICD-10-CM

## 2022-04-16 DIAGNOSIS — E876 Hypokalemia: Secondary | ICD-10-CM | POA: Diagnosis not present

## 2022-04-16 DIAGNOSIS — I10 Essential (primary) hypertension: Secondary | ICD-10-CM

## 2022-04-16 LAB — BASIC METABOLIC PANEL
BUN/Creatinine Ratio: 9 — ABNORMAL LOW (ref 12–28)
BUN: 14 mg/dL (ref 8–27)
CO2: 20 mmol/L (ref 20–29)
Calcium: 9.9 mg/dL (ref 8.7–10.3)
Chloride: 100 mmol/L (ref 96–106)
Creatinine, Ser: 1.53 mg/dL — ABNORMAL HIGH (ref 0.57–1.00)
Glucose: 84 mg/dL (ref 70–99)
Potassium: 4.8 mmol/L (ref 3.5–5.2)
Sodium: 136 mmol/L (ref 134–144)
eGFR: 36 mL/min/{1.73_m2} — ABNORMAL LOW (ref >59)

## 2022-04-20 ENCOUNTER — Ambulatory Visit (INDEPENDENT_AMBULATORY_CARE_PROVIDER_SITE_OTHER): Payer: PPO | Admitting: Family Medicine

## 2022-04-20 ENCOUNTER — Encounter: Payer: Self-pay | Admitting: Family Medicine

## 2022-04-20 ENCOUNTER — Telehealth: Payer: Self-pay | Admitting: *Deleted

## 2022-04-20 VITALS — BP 112/64 | HR 70 | Temp 98.1°F | Ht 62.0 in | Wt 162.2 lb

## 2022-04-20 DIAGNOSIS — L03116 Cellulitis of left lower limb: Secondary | ICD-10-CM

## 2022-04-20 DIAGNOSIS — Z79899 Other long term (current) drug therapy: Secondary | ICD-10-CM

## 2022-04-20 DIAGNOSIS — I10 Essential (primary) hypertension: Secondary | ICD-10-CM

## 2022-04-20 DIAGNOSIS — R110 Nausea: Secondary | ICD-10-CM

## 2022-04-20 MED ORDER — ONDANSETRON 4 MG PO TBDP: 4.0000 mg | ORAL_TABLET | Freq: Three times a day (TID) | ORAL | 0 refills | Status: DC | PRN

## 2022-04-20 NOTE — Telephone Encounter (Signed)
Pt aware of results, to decrease potassium and repeat lab in 2 weeks.  Lab scheduled for 05/04/22.  All questions if any were answered at the time of the call.   Jerline Pain, MD  04/19/2022  7:04 AM EDT     Creatinine is elevated likely from Bactrim  Potassium is replete.  Let's decrease potassium to 3mq once a day from 40 twice a day Check BMET again in 2 weeks MCandee Furbish MD

## 2022-04-20 NOTE — Progress Notes (Signed)
   Subjective:     Hannah Vasquez is a 71 y.o. female presenting for Wound Infection (Sting happened on 04/05/22. Saw urgent care 2 days later. Symptoms have been present for 2 weeks now.)     HPI  #Bee Sting - started on 04/05/2022 - overall improving - persisting redness - went to [facility] 04/07/2022 - was put on keflex - went to UC on 04/14/2022 - was put on Bactrim - since Friday is feeling very sleepy and nauseous - dry heaving - has 1 pill left of the medication - work on last night scratching - has noticed improvement on the bactrim - swelling has improved, lesion has scabbed over  Review of Systems   Social History   Tobacco Use  Smoking Status Never  Smokeless Tobacco Never        Objective:    BP Readings from Last 3 Encounters:  04/20/22 112/64  04/14/22 (!) 155/81  04/07/22 (!) 152/78   Wt Readings from Last 3 Encounters:  04/20/22 162 lb 3.2 oz (73.6 kg)  04/14/22 165 lb 5.5 oz (75 kg)  03/15/22 163 lb 9.6 oz (74.2 kg)    BP 112/64   Pulse 70   Temp 98.1 F (36.7 C) (Oral)   Ht 5' 2"  (1.575 m)   Wt 162 lb 3.2 oz (73.6 kg)   LMP  (LMP Unknown)   SpO2 97%   BMI 29.67 kg/m    Physical Exam Constitutional:      General: She is not in acute distress.    Appearance: She is well-developed. She is not diaphoretic.  HENT:     Right Ear: External ear normal.     Left Ear: External ear normal.     Nose: Nose normal.  Eyes:     Conjunctiva/sclera: Conjunctivae normal.  Cardiovascular:     Rate and Rhythm: Normal rate and regular rhythm.  Pulmonary:     Effort: Pulmonary effort is normal.     Breath sounds: Normal breath sounds.  Musculoskeletal:     Cervical back: Neck supple.  Skin:    General: Skin is warm and dry.     Capillary Refill: Capillary refill takes less than 2 seconds.     Comments: Left lower shin with small area of erythema w/o warmth surrounding a cental scabbed lesion.   Neurological:     Mental Status: She is alert. Mental  status is at baseline.  Psychiatric:        Mood and Affect: Mood normal.        Behavior: Behavior normal.           Assessment & Plan:   Problem List Items Addressed This Visit   None Visit Diagnoses     Left leg cellulitis    -  Primary   Relevant Medications   ondansetron (ZOFRAN-ODT) 4 MG disintegrating tablet   Nausea       Relevant Medications   ondansetron (ZOFRAN-ODT) 4 MG disintegrating tablet      Cellulitis is improved from prior - reviewed UC visit x 2. Complete course of bactrim and update if worsening redness.   Nausea - suspect 2/2 to antibiotics. Zofran prn  Return in about 2 months (around 06/20/2022), or if symptoms worsen or fail to improve, for TOC with Tabitha Dugal .  Lesleigh Noe, MD

## 2022-04-20 NOTE — Patient Instructions (Addendum)
Finish antibiotics  Update in 1-2 days if redness is worse or your fatigue/nausea does not get better   Zofran if needed for nausea

## 2022-04-22 ENCOUNTER — Other Ambulatory Visit: Payer: Self-pay | Admitting: Nurse Practitioner

## 2022-04-22 ENCOUNTER — Telehealth: Payer: Self-pay | Admitting: Family Medicine

## 2022-04-22 DIAGNOSIS — K7581 Nonalcoholic steatohepatitis (NASH): Secondary | ICD-10-CM | POA: Diagnosis not present

## 2022-04-22 DIAGNOSIS — K7402 Hepatic fibrosis, advanced fibrosis: Secondary | ICD-10-CM

## 2022-04-22 DIAGNOSIS — D8689 Sarcoidosis of other sites: Secondary | ICD-10-CM | POA: Diagnosis not present

## 2022-04-22 NOTE — Telephone Encounter (Signed)
Pt called in requesting a Parking Placard form be filled out for her . [facility] [phone]

## 2022-04-24 ENCOUNTER — Other Ambulatory Visit: Payer: Self-pay | Admitting: Cardiology

## 2022-04-26 NOTE — Telephone Encounter (Signed)
Completed and placed in box

## 2022-04-26 NOTE — Telephone Encounter (Signed)
Form placed up front and Pt notified of form ready to be picked up

## 2022-04-27 ENCOUNTER — Ambulatory Visit
Admission: RE | Admit: 2022-04-27 | Discharge: 2022-04-27 | Disposition: A | Discharge status: Home / Self Care | Payer: PPO | Source: Ambulatory Visit | Attending: Nurse Practitioner | Admitting: Nurse Practitioner

## 2022-04-27 DIAGNOSIS — K7402 Hepatic fibrosis, advanced fibrosis: Secondary | ICD-10-CM

## 2022-04-27 DIAGNOSIS — K7581 Nonalcoholic steatohepatitis (NASH): Secondary | ICD-10-CM

## 2022-04-27 DIAGNOSIS — K8020 Calculus of gallbladder without cholecystitis without obstruction: Secondary | ICD-10-CM | POA: Diagnosis not present

## 2022-04-30 ENCOUNTER — Other Ambulatory Visit: Payer: Self-pay | Admitting: Family Medicine

## 2022-04-30 DIAGNOSIS — M5416 Radiculopathy, lumbar region: Secondary | ICD-10-CM

## 2022-05-03 ENCOUNTER — Ambulatory Visit: Payer: PPO | Admitting: Family Medicine

## 2022-05-03 NOTE — Telephone Encounter (Signed)
Please call patient and advise her that Hannah Vasquez dose not do chronic pain. She can keep appointment with Hannah Vasquez but will need a referral to Pain medicine or she can change her TOC to [facility], Dr. Silvio Pate, or Dr. Diona Browner

## 2022-05-03 NOTE — Telephone Encounter (Signed)
Last filled on 04/01/22 LOV 04/20/22 for cellulitis, nausea Next OV on 11/2 with Eugenia Pancoast

## 2022-05-04 ENCOUNTER — Ambulatory Visit: Payer: PPO | Attending: Interventional Cardiology

## 2022-05-04 DIAGNOSIS — I10 Essential (primary) hypertension: Secondary | ICD-10-CM

## 2022-05-04 DIAGNOSIS — Z79899 Other long term (current) drug therapy: Secondary | ICD-10-CM

## 2022-05-04 LAB — BASIC METABOLIC PANEL
BUN/Creatinine Ratio: 13 (ref 12–28)
BUN: 14 mg/dL (ref 8–27)
CO2: 26 mmol/L (ref 20–29)
Calcium: 9.8 mg/dL (ref 8.7–10.3)
Chloride: 104 mmol/L (ref 96–106)
Creatinine, Ser: 1.08 mg/dL — ABNORMAL HIGH (ref 0.57–1.00)
Glucose: 83 mg/dL (ref 70–99)
Potassium: 4 mmol/L (ref 3.5–5.2)
Sodium: 144 mmol/L (ref 134–144)
eGFR: 54 mL/min/{1.73_m2} — ABNORMAL LOW (ref >59)

## 2022-05-04 NOTE — Telephone Encounter (Signed)
Mychart sent to pt.

## 2022-05-06 NOTE — Telephone Encounter (Signed)
Pt has been rescheduled for a TOC to Dr. Diona Browner.

## 2022-05-27 DIAGNOSIS — M5416 Radiculopathy, lumbar region: Secondary | ICD-10-CM | POA: Diagnosis not present

## 2022-06-02 ENCOUNTER — Other Ambulatory Visit: Payer: Self-pay | Admitting: Family Medicine

## 2022-06-02 DIAGNOSIS — M5416 Radiculopathy, lumbar region: Secondary | ICD-10-CM

## 2022-06-02 NOTE — Telephone Encounter (Signed)
  Encourage patient to contact the pharmacy for refills or they can request refills through [facility]  Did the patient contact the pharmacy:  no   LAST APPOINTMENT DATE: 04/20/22  NEXT APPOINTMENT DATE:  MEDICATIONtraMADol (ULTRAM) 50 MG tablet:  Is the patient out of medication? no  If not, how much is left? 1 week left  Is this a 90 day supply: yes  PHARMACY: [facility], [place] Phone:  [phone]  Fax:  [phone]      Let patient know to contact pharmacy at the end of the day to make sure medication is ready.  Please notify patient to allow 48-72 hours to process

## 2022-06-04 MED ORDER — TRAMADOL HCL 50 MG PO TABS: 50.0000 mg | ORAL_TABLET | Freq: Four times a day (QID) | ORAL | 0 refills | Status: DC | PRN

## 2022-06-04 NOTE — Telephone Encounter (Signed)
Refill sent.

## 2022-06-08 NOTE — Telephone Encounter (Signed)
[facility] is still saying they haven't received rx. Can rx be resent please.

## 2022-06-09 MED ORDER — TRAMADOL HCL 50 MG PO TABS: 50.0000 mg | ORAL_TABLET | Freq: Four times a day (QID) | ORAL | 0 refills | Status: DC | PRN

## 2022-06-09 NOTE — Telephone Encounter (Signed)
Hi, this was set as print when it was ordered last week. I have reset it as normal. Please approve.  Thanks!

## 2022-06-09 NOTE — Addendum Note (Signed)
Addended by: Loreen Freud on: 06/09/2022 08:37 AM   Modules accepted: Orders

## 2022-06-10 ENCOUNTER — Telehealth: Payer: Self-pay | Admitting: Family Medicine

## 2022-06-10 NOTE — Telephone Encounter (Signed)
Patient called and said that her rx for traMADol (ULTRAM) 50 MG tablet is usually for 90,however 60 was called in for her . She would like to know if the other 30 could be called in for her?

## 2022-06-14 ENCOUNTER — Encounter (INDEPENDENT_AMBULATORY_CARE_PROVIDER_SITE_OTHER): Payer: Self-pay

## 2022-06-16 ENCOUNTER — Ambulatory Visit: Payer: PPO | Admitting: Family Medicine

## 2022-06-16 ENCOUNTER — Ambulatory Visit (INDEPENDENT_AMBULATORY_CARE_PROVIDER_SITE_OTHER): Payer: PPO

## 2022-06-16 DIAGNOSIS — Z23 Encounter for immunization: Secondary | ICD-10-CM | POA: Diagnosis not present

## 2022-06-16 NOTE — Progress Notes (Signed)
Per orders of Dr. Lorelei Pont, injection of flu shot given by Loreen Freud. Patient tolerated injection well.

## 2022-06-17 ENCOUNTER — Ambulatory Visit: Payer: PPO | Admitting: Family

## 2022-06-22 ENCOUNTER — Other Ambulatory Visit: Payer: Self-pay | Admitting: Cardiology

## 2022-06-23 ENCOUNTER — Other Ambulatory Visit: Payer: Self-pay | Admitting: Physical Medicine and Rehabilitation

## 2022-06-23 DIAGNOSIS — M25552 Pain in left hip: Secondary | ICD-10-CM | POA: Diagnosis not present

## 2022-06-23 DIAGNOSIS — M5416 Radiculopathy, lumbar region: Secondary | ICD-10-CM | POA: Diagnosis not present

## 2022-06-23 DIAGNOSIS — M545 Low back pain, unspecified: Secondary | ICD-10-CM

## 2022-06-23 DIAGNOSIS — M25551 Pain in right hip: Secondary | ICD-10-CM | POA: Diagnosis not present

## 2022-06-24 ENCOUNTER — Telehealth: Payer: Self-pay | Admitting: Cardiology

## 2022-06-24 ENCOUNTER — Other Ambulatory Visit: Payer: Self-pay | Admitting: Cardiology

## 2022-06-24 NOTE — Telephone Encounter (Signed)
*  STAT* If patient is at the pharmacy, call can be transferred to refill team.   1. Which medications need to be refilled? (please list name of each medication and dose if known)   amLODipine (NORVASC) 5 MG table    2. Which pharmacy/location (including street and city if local pharmacy) is medication to be sent to?  [place], [place]    3. Do they need a 30 day or 90 day supply?  90 day

## 2022-06-25 MED ORDER — AMLODIPINE BESYLATE 5 MG PO TABS: 5.0000 mg | ORAL_TABLET | Freq: Every day | ORAL | 2 refills | Status: DC

## 2022-06-25 NOTE — Telephone Encounter (Signed)
Rx sent to pharmacy   

## 2022-07-01 ENCOUNTER — Ambulatory Visit
Admission: RE | Admit: 2022-07-01 | Discharge: 2022-07-01 | Disposition: A | Discharge status: Home / Self Care | Payer: PPO | Source: Ambulatory Visit | Attending: Physical Medicine and Rehabilitation | Admitting: Physical Medicine and Rehabilitation

## 2022-07-01 DIAGNOSIS — M545 Low back pain, unspecified: Secondary | ICD-10-CM

## 2022-07-01 DIAGNOSIS — M4316 Spondylolisthesis, lumbar region: Secondary | ICD-10-CM | POA: Diagnosis not present

## 2022-07-01 DIAGNOSIS — M48061 Spinal stenosis, lumbar region without neurogenic claudication: Secondary | ICD-10-CM | POA: Diagnosis not present

## 2022-07-02 ENCOUNTER — Telehealth: Payer: Self-pay

## 2022-07-02 NOTE — Telephone Encounter (Signed)
Yes it is okay to move transfer care appointment to before her scheduled surgery.  Put her in any open 40-minute slot.

## 2022-07-02 NOTE — Telephone Encounter (Signed)
Received clearance from [facility]. Patient was set up originally with Tabitha to do TOC. Patient was informed that Tabitha did not prescribe ongoing pain medications. Rescheduled to establish with Dr. Diona Browner. Had appointment set up for next week put Dr. Diona Browner will be out of the office so was rescheduled for 3/12. Patient is set for surgery on 2/17. Is there a time we can move her appointment up so be able to fill out for her procedure or can I put in for acute visit to have filled out.

## 2022-07-02 NOTE — Telephone Encounter (Signed)
Called patient states that she is not sure when her surgery will be. She will give our office a call once she has scheduled the provider that was doing is going to retire and will be getting set up with new surgeon. She will call our office to move accordingly after she has date for surgery.

## 2022-07-06 ENCOUNTER — Encounter: Payer: PPO | Admitting: Family Medicine

## 2022-07-10 ENCOUNTER — Other Ambulatory Visit: Payer: PPO

## 2022-07-12 ENCOUNTER — Other Ambulatory Visit (INDEPENDENT_AMBULATORY_CARE_PROVIDER_SITE_OTHER): Payer: PPO

## 2022-07-12 ENCOUNTER — Encounter: Payer: Self-pay | Admitting: *Deleted

## 2022-07-12 DIAGNOSIS — K7581 Nonalcoholic steatohepatitis (NASH): Secondary | ICD-10-CM | POA: Diagnosis not present

## 2022-07-12 LAB — HEPATIC FUNCTION PANEL
ALT: 18 U/L (ref 0–35)
AST: 17 U/L (ref 0–37)
Albumin: 4.2 g/dL (ref 3.5–5.2)
Alkaline Phosphatase: 95 U/L (ref 39–117)
Bilirubin, Direct: 0.1 mg/dL (ref 0.0–0.3)
Total Bilirubin: 0.5 mg/dL (ref 0.2–1.2)
Total Protein: 7.2 g/dL (ref 6.0–8.3)

## 2022-07-13 DIAGNOSIS — M5126 Other intervertebral disc displacement, lumbar region: Secondary | ICD-10-CM | POA: Diagnosis not present

## 2022-07-13 DIAGNOSIS — M5416 Radiculopathy, lumbar region: Secondary | ICD-10-CM | POA: Diagnosis not present

## 2022-07-14 ENCOUNTER — Telehealth: Payer: Self-pay | Admitting: *Deleted

## 2022-07-14 NOTE — Telephone Encounter (Signed)
   Name: Hannah Vasquez  DOB: [birth year]  MRN: [id number]  Primary Cardiologist: Candee Furbish, MD  Chart reviewed as part of pre-operative protocol coverage. Because of DORELLA LASTER past medical history and time since last visit, she will require a follow-up phone office visit in order to better assess preoperative cardiovascular risk.  Per office protocol may hold Aspirin 5-7 days prior to planned procedure.   Pre-op covering staff: - Please schedule appointment and call patient to inform them. If patient already had an upcoming appointment within acceptable timeframe, please add "pre-op clearance" to the appointment notes so provider is aware. - Please contact requesting surgeon's office via preferred method (i.e, phone, fax) to inform them of need for appointment prior to surgery.   Loel Dubonnet, NP  07/14/2022, 11:31 AM

## 2022-07-14 NOTE — Telephone Encounter (Signed)
   Pre-operative Risk Assessment    Patient Name: Hannah Vasquez  DOB: August 02, 1951 MRN: [id number]      Request for Surgical Clearance    Procedure:   LEFT L5-S1 IL ESI INJECTION  Date of Surgery:  Clearance TBD                                 Surgeon:  DR. Laroy Apple Surgeon's Group or Practice Name:  [facility] Phone number:  [phone] Fax number:  [phone] ATTN: INJECTION SCHEDULING   Type of Clearance Requested:   - Medical : ASA x 7 DAYS PRIOR   Type of Anesthesia:  Not Indicated   Additional requests/questions:    Jiles Prows   07/14/2022, 11:04 AM

## 2022-07-15 ENCOUNTER — Telehealth: Payer: Self-pay | Admitting: *Deleted

## 2022-07-15 NOTE — Telephone Encounter (Signed)
Set up tele phone appt for 12/1 for surg clearance.  Med rec and consent done.

## 2022-07-15 NOTE — Telephone Encounter (Signed)
  Patient Consent for Virtual Visit         Hannah Vasquez has provided verbal consent on 07/15/2022 for a virtual visit (video or telephone).   CONSENT FOR VIRTUAL VISIT FOR:  Hannah Vasquez  By participating in this virtual visit I agree to the following:  I hereby voluntarily request, consent and authorize [facility] and its employed or contracted physicians, physician assistants, nurse practitioners or other licensed health care professionals (the Practitioner), to provide me with telemedicine health care services (the "Services") as deemed necessary by the treating Practitioner. I acknowledge and consent to receive the Services by the Practitioner via telemedicine. I understand that the telemedicine visit will involve communicating with the Practitioner through live audiovisual communication technology and the disclosure of certain medical information by electronic transmission. I acknowledge that I have been given the opportunity to request an in-person assessment or other available alternative prior to the telemedicine visit and am voluntarily participating in the telemedicine visit.  I understand that I have the right to withhold or withdraw my consent to the use of telemedicine in the course of my care at any time, without affecting my right to future care or treatment, and that the Practitioner or I may terminate the telemedicine visit at any time. I understand that I have the right to inspect all information obtained and/or recorded in the course of the telemedicine visit and may receive copies of available information for a reasonable fee.  I understand that some of the potential risks of receiving the Services via telemedicine include:  Delay or interruption in medical evaluation due to technological equipment failure or disruption; Information transmitted may not be sufficient (e.g. poor resolution of images) to allow for appropriate medical decision making by the Practitioner;  and/or  In rare instances, security protocols could fail, causing a breach of personal health information.  Furthermore, I acknowledge that it is my responsibility to provide information about my medical history, conditions and care that is complete and accurate to the best of my ability. I acknowledge that Practitioner's advice, recommendations, and/or decision may be based on factors not within their control, such as incomplete or inaccurate data provided by me or distortions of diagnostic images or specimens that may result from electronic transmissions. I understand that the practice of medicine is not an exact science and that Practitioner makes no warranties or guarantees regarding treatment outcomes. I acknowledge that a copy of this consent can be made available to me via my patient portal ([facility]), or I can request a printed copy by calling the office of [facility].    I understand that my insurance will be billed for this visit.   I have read or had this consent read to me. I understand the contents of this consent, which adequately explains the benefits and risks of the Services being provided via telemedicine.  I have been provided ample opportunity to ask questions regarding this consent and the Services and have had my questions answered to my satisfaction. I give my informed consent for the services to be provided through the use of telemedicine in my medical care

## 2022-07-15 NOTE — Telephone Encounter (Signed)
Call transferred

## 2022-07-15 NOTE — Telephone Encounter (Signed)
Vm full will try later.

## 2022-07-16 ENCOUNTER — Ambulatory Visit: Payer: PPO | Attending: Cardiology | Admitting: General Practice

## 2022-07-16 ENCOUNTER — Ambulatory Visit (INDEPENDENT_AMBULATORY_CARE_PROVIDER_SITE_OTHER): Payer: PPO | Admitting: Internal Medicine

## 2022-07-16 ENCOUNTER — Encounter: Payer: Self-pay | Admitting: Internal Medicine

## 2022-07-16 VITALS — BP 128/72 | HR 70 | Ht 62.0 in | Wt 164.3 lb

## 2022-07-16 DIAGNOSIS — R1013 Epigastric pain: Secondary | ICD-10-CM

## 2022-07-16 DIAGNOSIS — G4733 Obstructive sleep apnea (adult) (pediatric): Secondary | ICD-10-CM | POA: Diagnosis not present

## 2022-07-16 DIAGNOSIS — D8689 Sarcoidosis of other sites: Secondary | ICD-10-CM | POA: Diagnosis not present

## 2022-07-16 DIAGNOSIS — K7581 Nonalcoholic steatohepatitis (NASH): Secondary | ICD-10-CM

## 2022-07-16 DIAGNOSIS — Z01810 Encounter for preprocedural cardiovascular examination: Secondary | ICD-10-CM

## 2022-07-16 NOTE — Patient Instructions (Addendum)
We have sent the following medications to your pharmacy for you to pick up at your convenience:  Continue omeprazole 40 mg, Vitamin E  Follow up in 6 months _______________________________________________________  If you are age 73 or older, your body mass index should be between 23-30. Your Body mass index is 30.05 kg/m. If this is out of the aforementioned range listed, please consider follow up with your Primary Care Provider.  If you are age 3 or younger, your body mass index should be between 19-25. Your Body mass index is 30.05 kg/m. If this is out of the aformentioned range listed, please consider follow up with your Primary Care Provider.   ________________________________________________________  The [facility] providers would like to encourage you to use [facility] to communicate with providers for non-urgent requests or questions.  Due to long hold times on the telephone, sending your provider a message by [facility] may be a faster and more efficient way to get a response.  Please allow 48 business hours for a response.  Please remember that this is for non-urgent requests.   Thank you for entrusting me with your care and choosing [facility].  Dr Hilarie Fredrickson

## 2022-07-16 NOTE — Progress Notes (Signed)
 Subjective:    Patient ID: Hannah Vasquez, female    DOB: 09/11/1950, 71 y.o.   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 She is here today with her daughter and was last seen in June 2023.    She reports she is doing well.  Her biggest issues of late continue to be back pain and bilateral knee pain.  She is going to have additional back injection before considering possible back surgery.  She is also been told she may need knee replacement but this is not yet scheduled.  She reports she is doing well.  She is trying to monitor her diet and has been able to lose 5 to 6 pounds though gained several pounds back over the holiday.  She does report drinking at least 1 Coke every day and she also enjoys sweet tea.  She has been using Vitamin E 800 IU daily and was able to successfully reduce omeprazole to 40 mg/day.  No ongoing epigastric pain or heartburn.  Bowel habits have been regular.  No increased abdominal girth or lower extremity edema.  No jaundice or itching.  She has remained off ursodiol  Review of Systems As per HPI, otherwise negative  Current Medications, Allergies, Past Medical History, Past Surgical History, Family History and Social History were reviewed in [facility] electronic medical record.    Objective:   Physical Exam BP 128/72   Pulse 70   Ht 5' 2" (1.575 m)   Wt 164 lb 5 oz (74.5 kg)   LMP  (LMP Unknown)   BMI 30.05 kg/m  Gen: awake, alert, NAD HEENT: anicteric Neuro: nonfocal  CMP     Component Value Date/Time   NA 144 05/04/2022 1132   K 4.0 05/04/2022 1132   CL 104 05/04/2022 1132   CO2 26 05/04/2022 1132   GLUCOSE 83 05/04/2022 1132   GLUCOSE 146 (H) 03/31/2022 0934   BUN 14 05/04/2022 1132   CREATININE 1.08 (H) 05/04/2022 1132   CALCIUM 9.8 05/04/2022 1132   PROT  7.2 07/12/2022 0919   PROT 6.7 07/02/2019 1104   ALBUMIN 4.2 07/12/2022 0919   ALBUMIN 4.2 07/02/2019 1104   AST 17 07/12/2022 0919   ALT 18 07/12/2022 0919   ALKPHOS 95 07/12/2022 0919   BILITOT 0.5 07/12/2022 0919   BILITOT 0.8 07/02/2019 1104   GFRNONAA >60 11/16/2021 1049   GFRAA 67 11/09/2019 0844    Lab Results  Component Value Date   INR 1.0 03/31/2022   INR 1.0 05/27/2021   INR 1.1 (H) 05/06/2021       Assessment & Plan:  71-year-old female with a history of 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  MASH (fibrosis 2-3 by liver biopsy November 2022)/hepatic sarcoid with granulomatous inflammation --she is doing very well her liver enzymes have completely normalized.  This is great news.  Her INR is normal.  Her alk phos is normal now under 100. -- Reassurance provided -- Continue Mediterranean type diet and avoid processed foods and high fructose corn syrup -- She will remain off ursodiol but on Vitamin E 800 IU daily  2.  Sarcoidosis --hepatic, pulmonary without evidence of involvement.  Currently liver enzymes are normal.  Monitor over time  3.  Dyspepsia with epigastric   pain --resolved with twice daily omeprazole and continues well-controlled on once daily omeprazole -- Continue omeprazole 40 mg daily for now  60-monthfollow-up, sooner if needed  30 minutes total spent today including patient facing time, coordination of care, reviewing medical history/procedures/pertinent radiology studies, and documentation of the encounter.

## 2022-07-16 NOTE — Progress Notes (Signed)
Virtual Visit via Telephone Note   Because of ARLYN BUMPUS co-morbid illnesses, she is at least at moderate risk for complications without adequate follow up.  This format is felt to be most appropriate for this patient at this time.  The patient did not have access to video technology/had technical difficulties with video requiring transitioning to audio format only (telephone).  All issues noted in this document were discussed and addressed.  No physical exam could be performed with this format.  Please refer to the patient's chart for her consent to telehealth for [facility].  Evaluation Performed:  Preoperative cardiovascular risk assessment _____________   Date:  07/16/2022   Patient ID:  Hannah Vasquez, DOB [birth year], MRN [id number] Patient Location:  Home Provider location:   Office  Primary Care Provider:  Waunita Schooner, MD Primary Cardiologist:  Candee Furbish, MD  Chief Complaint / Patient Profile   71 y.o. y/o female with a h/o CAD, mitral valve prolapse, HTN, HLD, OSA who is pending left L 5-S1 ESI and presents today for telephonic preoperative cardiovascular risk assessment.  History of Present Illness    Hannah Vasquez is a 71 y.o. female who presents via audio/video conferencing for a telehealth visit today.  Pt was last seen in cardiology clinic on 03/15/2022 by Dr. Marlou Porch.  At that time Hannah Vasquez was doing well .  The patient is now pending procedure as outlined above. Since her last visit, she remained stable from a cardiac standpoint  Today he denies chest pain, shortness of breath, lower extremity edema, fatigue, palpitations, melena, hematuria, hemoptysis, diaphoresis, weakness, presyncope, syncope, orthopnea, and PND.   Past Medical History    Past Medical History:  Diagnosis Date   Advanced hepatic fibrosis    Allergy    Anxiety    CAD (coronary artery disease)    Cataract    Chronic systolic heart failure (HCC)    DDD (degenerative disc  disease), thoracolumbar    Does use hearing aid    bilateral as of 10/05/2019   Elevated alkaline phosphatase level    Gallstones    Hemorrhoids    Hepatic steatosis    History of MI (myocardial infarction) 10/07/2017   Hypertension    Mitral valve prolapse    NASH (nonalcoholic steatohepatitis)    OSA (obstructive sleep apnea)    Osteoporosis    RBBB    Sarcoidosis    Status post dilation of esophageal narrowing    Past Surgical History:  Procedure Laterality Date   BASAL CELL CARCINOMA EXCISION  02/05/2016   Dr. Sarajane Jews, [facility]   BLADDER SURGERY  11/15/1995   bladder neck suspension, urinary incontinence   CARDIAC CATHETERIZATION  01/03/2017 DES LAD   CATARACT EXTRACTION Left 04/30/2016   Dr. Tommy Rainwater, [place] N/A 01/03/2017   Procedure: Coronary Stent Intervention;  Surgeon: Nelva Bush, MD;  Location: [place] [facility];  Service: Cardiovascular;  Laterality: N/A;   DENTAL SURGERY  06/20/2016   Tooth Implant; Dr. Kalman Shan   INTRAVASCULAR ULTRASOUND/IVUS N/A 01/03/2017   Procedure: Intravascular Ultrasound/IVUS;  Surgeon: Nelva Bush, MD;  Location: [place] [facility];  Service: Cardiovascular;  Laterality: N/A;   KNEE SURGERY  2003   torn meniscus   LEFT HEART CATH AND CORONARY ANGIOGRAPHY N/A 01/03/2017   Procedure: Left Heart Cath and Coronary Angiography;  Surgeon: Nelva Bush, MD;  Location: [place] [facility];  Service: Cardiovascular;  Laterality: N/A;   MOHS SURGERY  Right 2017   right side of face   ROTATOR CUFF REPAIR     SHOULDER SURGERY Right 2005    Allergies  Allergies  Allergen Reactions   Promethazine-Dm Nausea And Vomiting   Bactroban [Mupirocin Calcium]     Facial redness and swelling   Doxycycline Nausea And Vomiting   Codeine Nausea And Vomiting   Valtrex [Valacyclovir Hcl] Swelling   Penicillins Nausea And Vomiting and Rash    Home Medications    Prior to Admission medications    Medication Sig Start Date End Date Taking? Authorizing Provider  acetaminophen (TYLENOL) 500 MG tablet Take 500 mg by mouth every 6 (six) hours as needed for moderate pain.    [provider]  albuterol (VENTOLIN HFA) 108 (90 Base) MCG/ACT inhaler Inhale 1 puff into the lungs every 6 (six) hours as needed for wheezing or shortness of breath. 12/12/19   Waunita Schooner, MD  amLODipine (NORVASC) 5 MG tablet Take 1 tablet (5 mg total) by mouth daily. 06/25/22   Jerline Pain, MD  aspirin EC 81 MG tablet Take 81 mg by mouth every morning.     [provider]  atorvastatin (LIPITOR) 80 MG tablet TAKE 1 TABLET BY MOUTH DAILY AT 6 PM 04/27/22   Jerline Pain, MD  Calcium-Magnesium-Vitamin D (CALCIUM 1200+D3 PO) Take 1,200 mg by mouth daily.    [provider]  fluticasone (FLONASE) 50 MCG/ACT nasal spray Place 1 spray into both nostrils daily as needed for allergies.    [provider]  furosemide (LASIX) 20 MG tablet Take 1 tablet (20 mg total) by mouth daily as needed. 01/01/19   Jerline Pain, MD  gabapentin (NEURONTIN) 300 MG capsule Take 1 capsule (300 mg total) by mouth 3 (three) times daily. 05/21/21   Waunita Schooner, MD  isosorbide mononitrate (IMDUR) 30 MG 24 hr tablet Take 1 tablet (30 mg total) by mouth daily. 12/08/21   Waunita Schooner, MD  loratadine (CLARITIN) 10 MG tablet Take 1 tablet (10 mg total) by mouth every morning. 11/08/13   Lucille Passy, MD  losartan (COZAAR) 100 MG tablet TAKE 1 TABLET BY MOUTH ONCE A DAY 06/23/22   Jerline Pain, MD  meloxicam (MOBIC) 7.5 MG tablet Take 1 tablet (7.5 mg total) by mouth daily. 11/18/21   Waunita Schooner, MD  metoprolol succinate (TOPROL-XL) 25 MG 24 hr tablet Take 1 tablet (25 mg total) by mouth daily. 12/08/21   Waunita Schooner, MD  Multiple Vitamin (MULTIVITAMIN) tablet Take 1 tablet by mouth every morning.     [provider]  MYRBETRIQ 50 MG TB24 tablet Take 50 mg by mouth daily. 09/28/21   [provider]  nitroGLYCERIN (NITROSTAT) 0.4 MG SL tablet Place 1 tablet (0.4 mg total) under the tongue every 5 (five) minutes as needed for chest pain. 06/23/21   Jerline Pain, MD  Omega-3 Fatty Acids (FISH OIL) 1200 MG CAPS Take 1,200 mg by mouth every morning.    [provider]  omeprazole (PRILOSEC) 40 MG capsule Take 1 capsule (40 mg total) by mouth 2 (two) times daily. 12/21/21   Pyrtle, Lajuan Lines, MD  ondansetron (ZOFRAN-ODT) 4 MG disintegrating tablet Take 1 tablet (4 mg total) by mouth every 8 (eight) hours as needed for nausea or vomiting. 04/20/22   Waunita Schooner, MD  potassium chloride SA (KLOR-CON M) 20 MEQ tablet TAKE 2 TABLETS BY MOUTH TWICE DAILY Patient taking differently: 20 mEq daily. 06/25/22   Candee Furbish  C, MD  spironolactone (ALDACTONE) 25 MG tablet Take 1 tablet (25 mg total) by mouth daily. 04/01/22   Jerline Pain, MD  tiZANidine (ZANAFLEX) 4 MG tablet Take by mouth. 03/01/22   [provider]  traMADol (ULTRAM) 50 MG tablet Take 1 tablet (50 mg total) by mouth every 6 (six) hours as needed for severe pain. 06/09/22   Kennyth Arnold, FNP  vitamin E 180 MG (400 UNITS) capsule Take 800 Units by mouth in the morning and at bedtime.    [provider]    Physical Exam    Vital Signs:  Hannah Vasquez does not have vital signs available for review today.  Given telephonic nature of communication, physical exam is limited. AAOx3. NAD. Normal affect.  Speech and respirations are unlabored.  Accessory Clinical Findings    None  Assessment & Plan    1.  Preoperative Cardiovascular Risk Assessment: Left L5-S1 ESI injection, [facility], Dr. Laroy Apple      Primary Cardiologist: Candee Furbish, MD  Chart reviewed as part of pre-operative protocol coverage. Given past medical history and time since last visit, based on ACC/AHA guidelines, Hannah Vasquez would be at acceptable risk for the planned procedure without further cardiovascular testing.    Patient was advised that if she develops new symptoms prior to surgery to contact our office to arrange a follow-up appointment.  He verbalized understanding.  Her aspirin may be held for 5 to 7 days prior to her procedure.  Please resume as soon as hemostasis is achieved   I will route this recommendation to the requesting party via Epic fax function and remove from pre-op pool.     Time:   Today, I have spent 5 minutes with the patient with telehealth technology discussing medical history, symptoms, and management plan.  Prior to her phone evaluation I spent greater than 10 minutes reviewing her past medical history and cardiac medications.   Deberah Pelton, NP  07/16/2022, 8:30 AM

## 2022-07-18 ENCOUNTER — Other Ambulatory Visit: Payer: PPO

## 2022-07-20 ENCOUNTER — Other Ambulatory Visit: Payer: Self-pay | Admitting: Cardiology

## 2022-07-20 DIAGNOSIS — M25562 Pain in left knee: Secondary | ICD-10-CM | POA: Diagnosis not present

## 2022-07-20 DIAGNOSIS — M25561 Pain in right knee: Secondary | ICD-10-CM | POA: Diagnosis not present

## 2022-08-05 DIAGNOSIS — R262 Difficulty in walking, not elsewhere classified: Secondary | ICD-10-CM | POA: Diagnosis not present

## 2022-08-05 DIAGNOSIS — M6281 Muscle weakness (generalized): Secondary | ICD-10-CM | POA: Diagnosis not present

## 2022-08-05 DIAGNOSIS — M25661 Stiffness of right knee, not elsewhere classified: Secondary | ICD-10-CM | POA: Diagnosis not present

## 2022-08-05 DIAGNOSIS — M1712 Unilateral primary osteoarthritis, left knee: Secondary | ICD-10-CM | POA: Diagnosis not present

## 2022-08-05 DIAGNOSIS — M1711 Unilateral primary osteoarthritis, right knee: Secondary | ICD-10-CM | POA: Diagnosis not present

## 2022-08-07 ENCOUNTER — Ambulatory Visit
Admission: EM | Admit: 2022-08-07 | Discharge: 2022-08-07 | Disposition: A | Discharge status: Home / Self Care | Payer: PPO | Attending: Urgent Care | Admitting: Urgent Care

## 2022-08-07 DIAGNOSIS — S80811A Abrasion, right lower leg, initial encounter: Secondary | ICD-10-CM | POA: Diagnosis not present

## 2022-08-07 DIAGNOSIS — S60511A Abrasion of right hand, initial encounter: Secondary | ICD-10-CM

## 2022-08-07 MED ORDER — SULFAMETHOXAZOLE-TRIMETHOPRIM 800-160 MG PO TABS: 1.0000 | ORAL_TABLET | Freq: Two times a day (BID) | ORAL | 0 refills | Status: AC

## 2022-08-07 NOTE — Discharge Instructions (Addendum)
Clean your hand twice daily with soap and water.  Do not scrub.  Twice daily soaks with warm Epsom salt bath.  Pat dry.  Apply triple antibiotic ointment while wound appears open.  Do not use hydrogen peroxide or alcohol to clean your wound.  Follow up here or with your primary care provider if your symptoms are worsening or not improving with treatment.

## 2022-08-07 NOTE — ED Triage Notes (Signed)
Pt. Presents to UC after falling 3 day ago. Pt. Has scratches to her right hand and right knee. Pt. Expresses concern for infection to the right hand.

## 2022-08-07 NOTE — ED Provider Notes (Signed)
[facility]    CSN: [id number] Arrival date & time: 08/07/22  0807      History   Chief Complaint No chief complaint on file.   HPI Hannah Vasquez is a 71 y.o. female.   HPI  Patient presents to urgent care following a fall 3 days ago where she suffered abrasions ("scratches") to right hand and right knee.  She expresses concern for infection to the right hand.  Patient states she has been cleaning it with hydrogen peroxide.  PMH includes non-ST elevation MI  Past Medical History:  Diagnosis Date   Advanced hepatic fibrosis    Allergy    Anxiety    CAD (coronary artery disease)    Cataract    Chronic systolic heart failure (HCC)    DDD (degenerative disc disease), thoracolumbar    Does use hearing aid    bilateral as of 10/05/2019   Elevated alkaline phosphatase level    Gallstones    Hemorrhoids    Hepatic steatosis    History of MI (myocardial infarction) 10/07/2017   Hypertension    Mitral valve prolapse    NASH (nonalcoholic steatohepatitis)    OSA (obstructive sleep apnea)    Osteoporosis    RBBB    Sarcoidosis    Status post dilation of esophageal narrowing     Patient Active Problem List   Diagnosis Date Noted   Left-sided face pain 11/18/2021   Dizziness, nonspecific 11/03/2021   Decreased grip strength of left hand 11/03/2021   Sarcoidosis 09/10/2021   Thyroid nodule 09/10/2021   Urinary urgency 05/21/2021   Vitamin B12 deficiency 01/29/2021   Cough 07/29/2020   OSA (obstructive sleep apnea) 06/25/2020   Early satiety 04/29/2020   Upper abdominal pain 04/29/2020   Nausea without vomiting 04/29/2020   Prediabetes 03/25/2020   Nocturia 03/25/2020   Insomnia 03/25/2020   Reactive airway disease 12/12/2019   Neuropathy 65/68/1275   Chronic systolic heart failure ([place]) 07/11/2019   Elevated serum alkaline phosphatase level 07/10/2019   Leukocytosis 07/10/2019   Bilateral hand pain 04/09/2019   Bilateral hearing loss 04/09/2019    Trigger finger, left middle finger 10/30/2018   Lumbar back pain with radiculopathy affecting lower extremity 05/22/2018   Osteopenia 05/15/2018   Lumbar radiculopathy 04/27/2018   GERD (gastroesophageal reflux disease) 04/10/2018   Shortness of breath 08/21/2017   DDD (degenerative disc disease), thoracolumbar 04/28/2017   Back pain 04/21/2017   Hypokalemia 01/06/2017   History of non-ST elevation myocardial infarction (NSTEMI) 01/01/2017   Fatigue 11/27/2014   RBBB 12/19/2013   Mitral valve prolapse    HYPERTENSION, BENIGN 08/27/2010   Anxiety state 06/27/2009   Coronary atherosclerosis 06/27/2009   Mitral valve disorder 06/27/2009   HEMORRHOIDS 06/27/2009   Allergic rhinitis 06/27/2009   Disorder of bone and cartilage 11/11/2006    Past Surgical History:  Procedure Laterality Date   BASAL CELL CARCINOMA EXCISION  02/05/2016   Dr. Sarajane Jews, [facility]   BLADDER SURGERY  11/15/1995   bladder neck suspension, urinary incontinence   CARDIAC CATHETERIZATION  01/03/2017 DES LAD   CATARACT EXTRACTION Left 04/30/2016   Dr. Tommy Rainwater, [place] N/A 01/03/2017   Procedure: Coronary Stent Intervention;  Surgeon: Nelva Bush, MD;  Location: [place] [facility];  Service: Cardiovascular;  Laterality: N/A;   DENTAL SURGERY  06/20/2016   Tooth Implant; Dr. Kalman Shan   INTRAVASCULAR ULTRASOUND/IVUS N/A 01/03/2017   Procedure: Intravascular Ultrasound/IVUS;  Surgeon: Nelva Bush, MD;  Location: [facility]  IN[facility];  Service: Cardiovascular;  Laterality: N/A;   KNEE SURGERY  2003   torn meniscus   LEFT HEART CATH AND CORONARY ANGIOGRAPHY N/A 01/03/2017   Procedure: Left Heart Cath and Coronary Angiography;  Surgeon: Nelva Bush, MD;  Location: [place] [facility];  Service: Cardiovascular;  Laterality: N/A;   MOHS SURGERY Right 2017   right side of face   ROTATOR CUFF REPAIR     SHOULDER SURGERY Right 2005    OB History     Gravida  2    Para  2   Term  2   Preterm      AB      Living  2      SAB      IAB      Ectopic      Multiple      Live Births  2            Home Medications    Prior to Admission medications   Medication Sig Start Date End Date Taking? Authorizing Provider  acetaminophen (TYLENOL) 500 MG tablet Take 500 mg by mouth every 6 (six) hours as needed for moderate pain.    [provider]  albuterol (VENTOLIN HFA) 108 (90 Base) MCG/ACT inhaler Inhale 1 puff into the lungs every 6 (six) hours as needed for wheezing or shortness of breath. 12/12/19   Waunita Schooner, MD  amLODipine (NORVASC) 5 MG tablet Take 1 tablet (5 mg total) by mouth daily. 06/25/22   Jerline Pain, MD  aspirin EC 81 MG tablet Take 81 mg by mouth every morning.     [provider]  atorvastatin (LIPITOR) 80 MG tablet TAKE 1 TABLET BY MOUTH DAILY AT 6 PM 04/27/22   Jerline Pain, MD  Calcium-Magnesium-Vitamin D (CALCIUM 1200+D3 PO) Take 1,200 mg by mouth daily.    [provider]  fluticasone (FLONASE) 50 MCG/ACT nasal spray Place 1 spray into both nostrils daily as needed for allergies.    [provider]  furosemide (LASIX) 20 MG tablet Take 1 tablet (20 mg total) by mouth daily as needed. 01/01/19   Jerline Pain, MD  gabapentin (NEURONTIN) 300 MG capsule Take 1 capsule (300 mg total) by mouth 3 (three) times daily. 05/21/21   Waunita Schooner, MD  isosorbide mononitrate (IMDUR) 30 MG 24 hr tablet TAKE 1 TABLET BY MOUTH DAILY 07/20/22   Jerline Pain, MD  loratadine (CLARITIN) 10 MG tablet Take 1 tablet (10 mg total) by mouth every morning. 11/08/13   Lucille Passy, MD  losartan (COZAAR) 100 MG tablet TAKE 1 TABLET BY MOUTH ONCE A DAY 06/23/22   Jerline Pain, MD  meloxicam (MOBIC) 7.5 MG tablet Take 1 tablet (7.5 mg total) by mouth daily. 11/18/21   Waunita Schooner, MD  metoprolol succinate (TOPROL-XL) 25 MG 24 hr tablet Take 1 tablet (25 mg total) by mouth daily. 12/08/21   Waunita Schooner, MD   Multiple Vitamin (MULTIVITAMIN) tablet Take 1 tablet by mouth every morning.     [provider]  MYRBETRIQ 50 MG TB24 tablet Take 50 mg by mouth daily. 09/28/21   [provider]  nitroGLYCERIN (NITROSTAT) 0.4 MG SL tablet Place 1 tablet (0.4 mg total) under the tongue every 5 (five) minutes as needed for chest pain. 06/23/21   Jerline Pain, MD  Omega-3 Fatty Acids (FISH OIL) 1200 MG CAPS Take 1,200 mg by mouth every morning.    [provider]  omeprazole (PRILOSEC) 40 MG capsule Take 1 capsule (40 mg total) by mouth 2 (two) times daily. 12/21/21   Pyrtle, Lajuan Lines, MD  ondansetron (ZOFRAN-ODT) 4 MG disintegrating tablet Take 1 tablet (4 mg total) by mouth every 8 (eight) hours as needed for nausea or vomiting. 04/20/22   Waunita Schooner, MD  potassium chloride SA (KLOR-CON M) 20 MEQ tablet TAKE 2 TABLETS BY MOUTH TWICE DAILY Patient taking differently: 20 mEq daily. 06/25/22   Jerline Pain, MD  spironolactone (ALDACTONE) 25 MG tablet Take 1 tablet (25 mg total) by mouth daily. 04/01/22   Jerline Pain, MD  tiZANidine (ZANAFLEX) 4 MG tablet Take by mouth. 03/01/22   [provider]  traMADol (ULTRAM) 50 MG tablet Take 1 tablet (50 mg total) by mouth every 6 (six) hours as needed for severe pain. 06/09/22   Kennyth Arnold, FNP  vitamin E 180 MG (400 UNITS) capsule Take 800 Units by mouth in the morning and at bedtime.    [provider]    Family History Family History  Problem Relation Age of Onset   Mitral valve prolapse Mother    Hypertension Mother    Hypertension Father    Brain cancer Father    Colon cancer Neg Hx    Stomach cancer Neg Hx    Esophageal cancer Neg Hx    Pancreatic cancer Neg Hx     Social History Social History   Tobacco Use   Smoking status: Never   Smokeless tobacco: Never  Vaping Use   Vaping Use: Never used  Substance Use Topics   Alcohol use: No    Alcohol/week: 0.0 standard drinks of alcohol   Drug use: No      Allergies   Promethazine-dm, Bactroban [mupirocin calcium], Doxycycline, Codeine, Valtrex [valacyclovir hcl], and Penicillins   Review of Systems Review of Systems   Physical Exam Triage Vital Signs ED Triage Vitals  Enc Vitals Group     BP      Pulse      Resp      Temp      Temp src      SpO2      Weight      Height      Head Circumference      Peak Flow      Pain Score      Pain Loc      Pain Edu?      Excl. in [place]?    No data found.  Updated Vital Signs LMP  (LMP Unknown)   Visual Acuity Right Eye Distance:   Left Eye Distance:   Bilateral Distance:    Right Eye Near:   Left Eye Near:    Bilateral Near:     Physical Exam Constitutional:      Appearance: Normal appearance.  Musculoskeletal:       Hands:       Legs:     Comments: Patient has a laceration/abrasion to the heel of her right hand.  The area of injury is a "T"pattern approximately 1 cm x 1 cm.  The edges are not well-approximated.  There is some crusting/scabbing present and some minor inflammation.  The area is surrounded by an area of erythema of approximately 1 cm.  The wound is dry and there is no discharge present.  She also has some minor abrasions to her right knee that are in states of healing.  There is no erythema or edema present.  Area  is dry and there is no discharge present  Skin:    General: Skin is warm and dry.     Findings: Wound present.  Neurological:     General: No focal deficit present.     Mental Status: She is alert and oriented to person, place, and time.  Psychiatric:        Mood and Affect: Mood normal.        Behavior: Behavior normal.      UC Treatments / Results  Labs (all labs ordered are listed, but only abnormal results are displayed) Labs Reviewed - No data to display  EKG   Radiology No results found.  Procedures Procedures (including critical care time)  Medications Ordered in UC Medications - No data to display  Initial  Impression / Assessment and Plan / UC Course  I have reviewed the triage vital signs and the nursing notes.  Pertinent labs & imaging results that were available during my care of the patient were reviewed by me and considered in my medical decision making (see chart for details).   Minor abrasions in various states of healing to her right hand and right knee.  The right hand has some local erythema and edema.  I suspect this may be related to how she has been cleaning it with hydrogen peroxide several times daily since the event.  I instructed her to stop using hydrogen peroxide, to soak the wound twice daily in a warm Epsom salt bath, pat dry and then apply triple antibiotic ointment.  While I do not think there is any infection present, there is a possibility of a foreign object though my probing did not reveal any gravel or dirt.  I will prescribe an antibiotic to prevent infection.   Final Clinical Impressions(s) / UC Diagnoses   Final diagnoses:  None   Discharge Instructions   None    ED Prescriptions   None    PDMP not reviewed this encounter.   Rose Phi, [place] 08/07/22 [phone]

## 2022-08-10 ENCOUNTER — Other Ambulatory Visit: Payer: Self-pay

## 2022-08-10 MED ORDER — METOPROLOL SUCCINATE ER 25 MG PO TB24: 25.0000 mg | ORAL_TABLET | Freq: Every day | ORAL | 0 refills | Status: DC

## 2022-08-13 DIAGNOSIS — M5416 Radiculopathy, lumbar region: Secondary | ICD-10-CM | POA: Diagnosis not present

## 2022-08-19 DIAGNOSIS — M1712 Unilateral primary osteoarthritis, left knee: Secondary | ICD-10-CM | POA: Diagnosis not present

## 2022-08-19 DIAGNOSIS — M25661 Stiffness of right knee, not elsewhere classified: Secondary | ICD-10-CM | POA: Diagnosis not present

## 2022-08-19 DIAGNOSIS — R262 Difficulty in walking, not elsewhere classified: Secondary | ICD-10-CM | POA: Diagnosis not present

## 2022-08-19 DIAGNOSIS — M6281 Muscle weakness (generalized): Secondary | ICD-10-CM | POA: Diagnosis not present

## 2022-08-19 DIAGNOSIS — M1711 Unilateral primary osteoarthritis, right knee: Secondary | ICD-10-CM | POA: Diagnosis not present

## 2022-08-26 DIAGNOSIS — M25661 Stiffness of right knee, not elsewhere classified: Secondary | ICD-10-CM | POA: Diagnosis not present

## 2022-08-26 DIAGNOSIS — M6281 Muscle weakness (generalized): Secondary | ICD-10-CM | POA: Diagnosis not present

## 2022-08-26 DIAGNOSIS — M1711 Unilateral primary osteoarthritis, right knee: Secondary | ICD-10-CM | POA: Diagnosis not present

## 2022-08-26 DIAGNOSIS — M1712 Unilateral primary osteoarthritis, left knee: Secondary | ICD-10-CM | POA: Diagnosis not present

## 2022-08-26 DIAGNOSIS — R262 Difficulty in walking, not elsewhere classified: Secondary | ICD-10-CM | POA: Diagnosis not present

## 2022-09-02 DIAGNOSIS — M6281 Muscle weakness (generalized): Secondary | ICD-10-CM | POA: Diagnosis not present

## 2022-09-02 DIAGNOSIS — R262 Difficulty in walking, not elsewhere classified: Secondary | ICD-10-CM | POA: Diagnosis not present

## 2022-09-02 DIAGNOSIS — M1711 Unilateral primary osteoarthritis, right knee: Secondary | ICD-10-CM | POA: Diagnosis not present

## 2022-09-02 DIAGNOSIS — M1712 Unilateral primary osteoarthritis, left knee: Secondary | ICD-10-CM | POA: Diagnosis not present

## 2022-09-02 DIAGNOSIS — M25661 Stiffness of right knee, not elsewhere classified: Secondary | ICD-10-CM | POA: Diagnosis not present

## 2022-09-09 ENCOUNTER — Telehealth: Payer: Self-pay | Admitting: *Deleted

## 2022-09-09 DIAGNOSIS — M170 Bilateral primary osteoarthritis of knee: Secondary | ICD-10-CM | POA: Diagnosis not present

## 2022-09-09 NOTE — Telephone Encounter (Signed)
   Name: Hannah Vasquez  DOB: [birth year]  MRN: [id number]  Primary Cardiologist: Candee Furbish, MD  Chart reviewed as part of pre-operative protocol coverage. Because of Hannah Vasquez past medical history and time since last visit, she will require a follow-up telephone visit in order to better assess preoperative cardiovascular risk.  Pre-op covering staff: - Please schedule appointment and call patient to inform them. If patient already had an upcoming appointment within acceptable timeframe, please add "pre-op clearance" to the appointment notes so provider is aware. - Please contact requesting surgeon's office via preferred method (i.e, phone, fax) to inform them of need for appointment prior to surgery.  Her aspirin may be held for 5 to 7 days prior to her procedure. Please resume as soon as hemostasis is achieved    Elgie Collard, PA-C  09/09/2022, 3:47 PM

## 2022-09-09 NOTE — Telephone Encounter (Signed)
   Pre-operative Risk Assessment    Patient Name: Hannah Vasquez  DOB: August 14, 1951 MRN: [id number]      Request for Surgical Clearance    Procedure:   LEFT TOTAL KNEE ARTHROPLASTY  Date of Surgery:  Clearance TBD                                 Surgeon:  DR. DANIEL MARCHWIANY Surgeon's Group or Practice Name:  [facility] Phone number:  [phone] EXT [phone] ATTN: [place] Fax number:  [phone]   Type of Clearance Requested:   - Medical ; ASA    Type of Anesthesia:  Spinal   Additional requests/questions:    Jiles Prows   09/09/2022, 3:00 PM

## 2022-09-10 ENCOUNTER — Telehealth: Payer: Self-pay | Admitting: *Deleted

## 2022-09-10 NOTE — Telephone Encounter (Signed)
Pt agreeable to tele pre op appt 09/27/22 @ 2 pm. Med rec and consent are done.     Patient Consent for Virtual Visit        Hannah Vasquez has provided verbal consent on 09/10/2022 for a virtual visit (video or telephone).   CONSENT FOR VIRTUAL VISIT FOR:  Hannah Vasquez  By participating in this virtual visit I agree to the following:  I hereby voluntarily request, consent and authorize [place] [facility] and its employed or contracted physicians, physician assistants, nurse practitioners or other licensed health care professionals (the Practitioner), to provide me with telemedicine health care services (the "Services") as deemed necessary by the treating Practitioner. I acknowledge and consent to receive the Services by the Practitioner via telemedicine. I understand that the telemedicine visit will involve communicating with the Practitioner through live audiovisual communication technology and the disclosure of certain medical information by electronic transmission. I acknowledge that I have been given the opportunity to request an in-person assessment or other available alternative prior to the telemedicine visit and am voluntarily participating in the telemedicine visit.  I understand that I have the right to withhold or withdraw my consent to the use of telemedicine in the course of my care at any time, without affecting my right to future care or treatment, and that the Practitioner or I may terminate the telemedicine visit at any time. I understand that I have the right to inspect all information obtained and/or recorded in the course of the telemedicine visit and may receive copies of available information for a reasonable fee.  I understand that some of the potential risks of receiving the Services via telemedicine include:  Delay or interruption in medical evaluation due to technological equipment failure or disruption; Information transmitted may not be sufficient (e.g. poor resolution  of images) to allow for appropriate medical decision making by the Practitioner; and/or  In rare instances, security protocols could fail, causing a breach of personal health information.  Furthermore, I acknowledge that it is my responsibility to provide information about my medical history, conditions and care that is complete and accurate to the best of my ability. I acknowledge that Practitioner's advice, recommendations, and/or decision may be based on factors not within their control, such as incomplete or inaccurate data provided by me or distortions of diagnostic images or specimens that may result from electronic transmissions. I understand that the practice of medicine is not an exact science and that Practitioner makes no warranties or guarantees regarding treatment outcomes. I acknowledge that a copy of this consent can be made available to me via my patient portal ([facility]), or I can request a printed copy by calling the office of [facility].    I understand that my insurance will be billed for this visit.   I have read or had this consent read to me. I understand the contents of this consent, which adequately explains the benefits and risks of the Services being provided via telemedicine.  I have been provided ample opportunity to ask questions regarding this consent and the Services and have had my questions answered to my satisfaction. I give my informed consent for the services to be provided through the use of telemedicine in my medical care

## 2022-09-10 NOTE — Telephone Encounter (Signed)
Pt agreeable to tele pre op appt 09/27/22 @ 2 pm. Med rec and consent are done.

## 2022-09-17 ENCOUNTER — Other Ambulatory Visit: Payer: Self-pay | Admitting: Family

## 2022-09-24 NOTE — Progress Notes (Unsigned)
Virtual Visit via Telephone Note   Because of Hannah Vasquez co-morbid illnesses, she is at least at moderate risk for complications without adequate follow up.  This format is felt to be most appropriate for this patient at this time.  The patient did not have access to video technology/had technical difficulties with video requiring transitioning to audio format only (telephone).  All issues noted in this document were discussed and addressed.  No physical exam could be performed with this format.  Please refer to the patient's chart for her consent to telehealth for [facility].  Evaluation Performed:  Preoperative cardiovascular risk assessment _____________   Date:  09/24/2022   Patient ID:  Hannah Vasquez, DOB 06-16-1951, MRN [id number] Patient Location:  Home Provider location:   Office  Primary Care Provider:  Jinny Sanders, MD Primary Cardiologist:  Hannah Furbish, MD  Chief Complaint / Patient Profile   72 y.o. y/o female with a h/o CAD s/p STEMI with 2 stents placed in LAD, RBBB mitral valve prolapse, HTN, HLD, OSA who is pending left total knee arthroplasty and presents today for telephonic preoperative cardiovascular risk assessment.  History of Present Illness    Hannah Vasquez is a 72 y.o. female who presents via audio/video conferencing for a telehealth visit today.  Pt was last seen in cardiology clinic on 03/15/2022 by Dr. Marlou Vasquez.  At that time Hannah Vasquez was doing well with complaint of knee Vasquez..  The patient is now pending procedure as outlined above. Since her last visit, she said no new cardiac complaints.  She has been struggling with her knee Vasquez and is currently having injections in her knees and also her back.   She denies chest Vasquez, shortness of breath, lower extremity edema, fatigue, palpitations, melena, hematuria, hemoptysis, diaphoresis, weakness, presyncope, syncope, orthopnea, and PND.   Her aspirin may be held for 5 to 7 days prior to her  procedure. Please resume as soon as hemostasis is achieved    Past Medical History    Past Medical History:  Diagnosis Date   Advanced hepatic fibrosis    Allergy    Anxiety    CAD (coronary artery disease)    Cataract    Chronic systolic heart failure (HCC)    DDD (degenerative disc disease), thoracolumbar    Does use hearing aid    bilateral as of 10/05/2019   Elevated alkaline phosphatase level    Gallstones    Hemorrhoids    Hepatic steatosis    History of MI (myocardial infarction) 10/07/2017   Hypertension    Mitral valve prolapse    NASH (nonalcoholic steatohepatitis)    OSA (obstructive sleep apnea)    Osteoporosis    RBBB    Sarcoidosis    Status post dilation of esophageal narrowing    Past Surgical History:  Procedure Laterality Date   BASAL CELL CARCINOMA EXCISION  02/05/2016   Dr. Sarajane Vasquez, [facility]   BLADDER SURGERY  11/15/1995   bladder neck suspension, urinary incontinence   CARDIAC CATHETERIZATION  01/03/2017 DES LAD   CATARACT EXTRACTION Left 04/30/2016   Dr. Tommy Vasquez, [place] N/A 01/03/2017   Procedure: Coronary Stent Intervention;  Surgeon: Hannah Bush, MD;  Location: [place];  Service: Cardiovascular;  Laterality: N/A;   DENTAL SURGERY  06/20/2016   Tooth Implant; Dr. Kalman Vasquez   INTRAVASCULAR ULTRASOUND/IVUS N/A 01/03/2017   Procedure: Intravascular Ultrasound/IVUS;  Surgeon: Hannah Bush, MD;  [facility]  INVASIVE CV LAB;  Service: Cardiovascular;  Laterality: N/A;   KNEE SURGERY  2003   torn meniscus   LEFT HEART CATH AND CORONARY ANGIOGRAPHY N/A 01/03/2017   Procedure: Left Heart Cath and Coronary Angiography;  Surgeon: Hannah Bush, MD;  Location: [place] [facility];  Service: Cardiovascular;  Laterality: N/A;   MOHS SURGERY Right 2017   right side of face   ROTATOR CUFF REPAIR     SHOULDER SURGERY Right 2005    Allergies  Allergies  Allergen Reactions   Promethazine-Dm  Nausea And Vomiting   Bactroban [Mupirocin Calcium]     Facial redness and swelling   Doxycycline Nausea And Vomiting   Codeine Nausea And Vomiting   Valtrex [Valacyclovir Hcl] Swelling   Penicillins Nausea And Vomiting and Rash    Home Medications    Prior to Admission medications   Medication Sig Start Date End Date Taking? Authorizing Provider  acetaminophen (TYLENOL) 500 MG tablet Take 500 mg by mouth every 6 (six) hours as needed for moderate Vasquez.    [provider]  albuterol (VENTOLIN HFA) 108 (90 Base) MCG/ACT inhaler Inhale 1 puff into the lungs every 6 (six) hours as needed for wheezing or shortness of breath. 12/12/19   Hannah Schooner, MD  amLODipine (NORVASC) 5 MG tablet Take 1 tablet (5 mg total) by mouth daily. 06/25/22   Hannah Pain, MD  aspirin EC 81 MG tablet Take 81 mg by mouth every morning.     [provider]  atorvastatin (LIPITOR) 80 MG tablet TAKE 1 TABLET BY MOUTH DAILY AT 6 PM 04/27/22   Hannah Pain, MD  Calcium-Magnesium-Vitamin D (CALCIUM 1200+D3 PO) Take 1,200 mg by mouth daily.    [provider]  fluticasone (FLONASE) 50 MCG/ACT nasal spray Place 1 spray into both nostrils daily as needed for allergies.    [provider]  furosemide (LASIX) 20 MG tablet Take 1 tablet (20 mg total) by mouth daily as needed. Patient not taking: Reported on 09/10/2022 01/01/19   Hannah Pain, MD  gabapentin (NEURONTIN) 300 MG capsule Take 1 capsule (300 mg total) by mouth 3 (three) times daily. 05/21/21   Hannah Schooner, MD  isosorbide mononitrate (IMDUR) 30 MG 24 hr tablet TAKE 1 TABLET BY MOUTH DAILY 07/20/22   Hannah Pain, MD  loratadine (CLARITIN) 10 MG tablet Take 1 tablet (10 mg total) by mouth every morning. 11/08/13   Hannah Passy, MD  losartan (COZAAR) 100 MG tablet TAKE 1 TABLET BY MOUTH ONCE A DAY 06/23/22   Hannah Pain, MD  meloxicam (MOBIC) 7.5 MG tablet Take 1 tablet (7.5 mg total) by mouth daily. 11/18/21   Hannah Schooner, MD   metoprolol succinate (TOPROL-XL) 25 MG 24 hr tablet Take 1 tablet (25 mg total) by mouth daily. 08/10/22   Hannah Quint B, FNP  Multiple Vitamin (MULTIVITAMIN) tablet Take 1 tablet by mouth every morning.     [provider]  MYRBETRIQ 50 MG TB24 tablet Take 50 mg by mouth daily. Patient not taking: Reported on 09/10/2022 09/28/21   [provider]  nitroGLYCERIN (NITROSTAT) 0.4 MG SL tablet Place 1 tablet (0.4 mg total) under the tongue every 5 (five) minutes as needed for chest Vasquez. 06/23/21   Hannah Pain, MD  Omega-3 Fatty Acids (FISH OIL) 1200 MG CAPS Take 1,200 mg by mouth every morning.    [provider]  omeprazole (PRILOSEC) 40 MG capsule Take 1 capsule (40 mg total) by  mouth 2 (two) times daily. 12/21/21   Pyrtle, Lajuan Lines, MD  ondansetron (ZOFRAN-ODT) 4 MG disintegrating tablet Take 1 tablet (4 mg total) by mouth every 8 (eight) hours as needed for nausea or vomiting. Patient not taking: Reported on 09/10/2022 04/20/22   Hannah Schooner, MD  potassium chloride SA (KLOR-CON M) 20 MEQ tablet TAKE 2 TABLETS BY MOUTH TWICE DAILY Patient taking differently: 20 mEq daily. 06/25/22   Hannah Pain, MD  spironolactone (ALDACTONE) 25 MG tablet Take 1 tablet (25 mg total) by mouth daily. 04/01/22   Hannah Pain, MD  tiZANidine (ZANAFLEX) 4 MG tablet Take by mouth. 03/01/22   [provider]  traMADol (ULTRAM) 50 MG tablet Take 1 tablet (50 mg total) by mouth every 6 (six) hours as needed for severe Vasquez. 06/09/22   Kennyth Arnold, FNP  vitamin E 180 MG (400 UNITS) capsule Take 800 Units by mouth in the morning and at bedtime.    [provider]    Physical Exam    Vital Signs:  KRISTALYN BISPING does not have vital signs available for review today.  Given telephonic nature of communication, physical exam is limited. AAOx3. NAD. Normal affect.  Speech and respirations are unlabored.  Accessory Clinical Findings    None  Assessment & Plan    1.   Preoperative Cardiovascular Risk Assessment:  The patient affirms she has been doing well without any new cardiac symptoms. They are able to achieve 4 METS without cardiac limitations. Therefore, based on ACC/AHA guidelines, the patient would be at acceptable risk for the planned procedure without further cardiovascular testing. The patient was advised that if she develops new symptoms prior to surgery to contact our office to arrange for a follow-up visit, and she verbalized understanding.   Ms. Zidek perioperative risk of a major cardiac event is 11% according to the Revised Cardiac Risk Index (RCRI).  Therefore, she is at high risk for perioperative complications.   Her functional capacity is fair at 4.73 METs according to the Duke Activity Status Index (DASI). Recommendations: According to ACC/AHA guidelines, no further cardiovascular testing needed.  The patient may proceed to surgery at acceptable risk.   Antiplatelet and/or Anticoagulation Recommendations: Aspirin can be held for 5-7 days prior to her surgery.  Please resume Aspirin post operatively when it is felt to be safe from a bleeding standpoint.      The patient was advised that if she develops new symptoms prior to surgery to contact our office to arrange for a follow-up visit, and she verbalized understanding.    Time:   Today, I have spent 8 minutes with the patient with telehealth technology discussing medical history, symptoms, and management plan.     Mable Fill, Marissa Nestle, NP  09/24/2022, 12:05 PM

## 2022-09-27 ENCOUNTER — Ambulatory Visit: Payer: PPO | Attending: Internal Medicine

## 2022-09-27 DIAGNOSIS — Z01810 Encounter for preprocedural cardiovascular examination: Secondary | ICD-10-CM

## 2022-09-28 ENCOUNTER — Telehealth: Payer: Self-pay | Admitting: Cardiology

## 2022-09-28 MED ORDER — METOPROLOL SUCCINATE ER 25 MG PO TB24: 25.0000 mg | ORAL_TABLET | Freq: Every day | ORAL | 1 refills | Status: DC

## 2022-09-28 NOTE — Telephone Encounter (Signed)
Pt is requesting Dr. Marlou Porch refill her medication metoprolol, that her PCP has been refilling. Would Dr. Marlou Porch like to refill this medication? Please address

## 2022-09-28 NOTE — Telephone Encounter (Signed)
OK to refill.  Pt last saw Derl Barrow 02/2022 and will be due for f/u in 02/2023.

## 2022-09-28 NOTE — Telephone Encounter (Signed)
*  STAT* If patient is at the pharmacy, call can be transferred to refill team.   1. Which medications need to be refilled? (please list name of each medication and dose if known) metoprolol succinate (TOPROL-XL) 25 MG 24 hr tablet   2. Which pharmacy/location (including street and city if local pharmacy) is medication to be sent to? [place], [place]   3. Do they need a 30 day or 90 day supply? 90 day  She states her PCP normally fills this, but her PCP left and she doesn't have a PCP at this time.  She wants to know if Dr Marlou Porch will fill it for her.

## 2022-10-06 DIAGNOSIS — M170 Bilateral primary osteoarthritis of knee: Secondary | ICD-10-CM | POA: Diagnosis not present

## 2022-10-06 DIAGNOSIS — M5416 Radiculopathy, lumbar region: Secondary | ICD-10-CM | POA: Diagnosis not present

## 2022-10-12 ENCOUNTER — Encounter: Payer: Self-pay | Admitting: Internal Medicine

## 2022-10-12 DIAGNOSIS — M170 Bilateral primary osteoarthritis of knee: Secondary | ICD-10-CM | POA: Diagnosis not present

## 2022-10-18 ENCOUNTER — Encounter: Payer: Self-pay | Admitting: Internal Medicine

## 2022-10-19 DIAGNOSIS — M170 Bilateral primary osteoarthritis of knee: Secondary | ICD-10-CM | POA: Diagnosis not present

## 2022-10-26 ENCOUNTER — Encounter: Payer: PPO | Admitting: Family Medicine

## 2022-10-26 DIAGNOSIS — M170 Bilateral primary osteoarthritis of knee: Secondary | ICD-10-CM | POA: Diagnosis not present

## 2022-11-18 DIAGNOSIS — M5416 Radiculopathy, lumbar region: Secondary | ICD-10-CM | POA: Diagnosis not present

## 2022-11-29 ENCOUNTER — Encounter: Payer: Self-pay | Admitting: *Deleted

## 2022-12-06 DIAGNOSIS — M5416 Radiculopathy, lumbar region: Secondary | ICD-10-CM | POA: Diagnosis not present

## 2022-12-20 DIAGNOSIS — Z1231 Encounter for screening mammogram for malignant neoplasm of breast: Secondary | ICD-10-CM | POA: Diagnosis not present

## 2022-12-20 DIAGNOSIS — Z13820 Encounter for screening for osteoporosis: Secondary | ICD-10-CM | POA: Diagnosis not present

## 2022-12-20 DIAGNOSIS — N951 Menopausal and female climacteric states: Secondary | ICD-10-CM | POA: Diagnosis not present

## 2022-12-21 DIAGNOSIS — M5416 Radiculopathy, lumbar region: Secondary | ICD-10-CM | POA: Diagnosis not present

## 2022-12-21 DIAGNOSIS — Z9842 Cataract extraction status, left eye: Secondary | ICD-10-CM | POA: Diagnosis not present

## 2022-12-21 DIAGNOSIS — D3131 Benign neoplasm of right choroid: Secondary | ICD-10-CM | POA: Diagnosis not present

## 2022-12-21 DIAGNOSIS — H5213 Myopia, bilateral: Secondary | ICD-10-CM | POA: Diagnosis not present

## 2022-12-21 DIAGNOSIS — Z9841 Cataract extraction status, right eye: Secondary | ICD-10-CM | POA: Diagnosis not present

## 2022-12-21 DIAGNOSIS — H04123 Dry eye syndrome of bilateral lacrimal glands: Secondary | ICD-10-CM | POA: Diagnosis not present

## 2022-12-30 ENCOUNTER — Other Ambulatory Visit (INDEPENDENT_AMBULATORY_CARE_PROVIDER_SITE_OTHER): Payer: PPO

## 2022-12-30 ENCOUNTER — Ambulatory Visit: Payer: PPO | Admitting: Internal Medicine

## 2022-12-30 ENCOUNTER — Encounter: Payer: Self-pay | Admitting: Internal Medicine

## 2022-12-30 VITALS — BP 122/68 | HR 71 | Ht 66.0 in | Wt 152.0 lb

## 2022-12-30 DIAGNOSIS — D8689 Sarcoidosis of other sites: Secondary | ICD-10-CM | POA: Diagnosis not present

## 2022-12-30 DIAGNOSIS — K7581 Nonalcoholic steatohepatitis (NASH): Secondary | ICD-10-CM

## 2022-12-30 DIAGNOSIS — R1013 Epigastric pain: Secondary | ICD-10-CM | POA: Diagnosis not present

## 2022-12-30 LAB — CBC WITH DIFFERENTIAL/PLATELET
Basophils Absolute: 0.1 10*3/uL (ref 0.0–0.1)
Basophils Relative: 0.5 % (ref 0.0–3.0)
Eosinophils Absolute: 0.3 10*3/uL (ref 0.0–0.7)
Eosinophils Relative: 1.9 % (ref 0.0–5.0)
HCT: 41.2 % (ref 36.0–46.0)
Hemoglobin: 13.8 g/dL (ref 12.0–15.0)
Lymphocytes Relative: 21 % (ref 12.0–46.0)
Lymphs Abs: 2.8 10*3/uL (ref 0.7–4.0)
MCHC: 33.4 g/dL (ref 30.0–36.0)
MCV: 85 fl (ref 78.0–100.0)
Monocytes Absolute: 0.9 10*3/uL (ref 0.1–1.0)
Monocytes Relative: 6.4 % (ref 3.0–12.0)
Neutro Abs: 9.5 10*3/uL — ABNORMAL HIGH (ref 1.4–7.7)
Neutrophils Relative %: 70.2 % (ref 43.0–77.0)
Platelets: 347 10*3/uL (ref 150.0–400.0)
RBC: 4.85 Mil/uL (ref 3.87–5.11)
RDW: 13.2 % (ref 11.5–15.5)
WBC: 13.5 10*3/uL — ABNORMAL HIGH (ref 4.0–10.5)

## 2022-12-30 LAB — COMPREHENSIVE METABOLIC PANEL
ALT: 15 U/L (ref 0–35)
AST: 20 U/L (ref 0–37)
Albumin: 4.5 g/dL (ref 3.5–5.2)
Alkaline Phosphatase: 134 U/L — ABNORMAL HIGH (ref 39–117)
BUN: 13 mg/dL (ref 6–23)
CO2: 28 mEq/L (ref 19–32)
Calcium: 10.4 mg/dL (ref 8.4–10.5)
Chloride: 104 mEq/L (ref 96–112)
Creatinine, Ser: 0.97 mg/dL (ref 0.40–1.20)
GFR: 57.67 mL/min — ABNORMAL LOW (ref >60.00)
Glucose, Bld: 113 mg/dL — ABNORMAL HIGH (ref 70–99)
Potassium: 4.4 mEq/L (ref 3.5–5.1)
Sodium: 142 mEq/L (ref 135–145)
Total Bilirubin: 0.6 mg/dL (ref 0.2–1.2)
Total Protein: 7.8 g/dL (ref 6.0–8.3)

## 2022-12-30 LAB — PROTIME-INR
INR: 1 ratio (ref 0.8–1.0)
Prothrombin Time: 11.1 s (ref 9.6–13.1)

## 2022-12-30 NOTE — Patient Instructions (Addendum)
Your provider has requested that you go to the basement level for lab work before leaving today. Press "B" on the elevator. The lab is located at the first door on the left as you exit the elevator.  _______________________________________________________  Continue prilosec 40 mg daily.  Continue Vitamin E 800 iu daily.  If your blood pressure at your visit was 140/90 or greater, please contact your primary care physician to follow up on this.  _______________________________________________________  If you are age 29 or older, your body mass index should be between 23-30. Your Body mass index is 24.53 kg/m. If this is out of the aforementioned range listed, please consider follow up with your Primary Care Provider.  If you are age 97 or younger, your body mass index should be between 19-25. Your Body mass index is 24.53 kg/m. If this is out of the aformentioned range listed, please consider follow up with your Primary Care Provider.   ________________________________________________________  The [facility] providers would like to encourage you to use [facility] to communicate with providers for non-urgent requests or questions.  Due to long hold times on the telephone, sending your provider a message by [facility] may be a faster and more efficient way to get a response.  Please allow 48 business hours for a response.  Please remember that this is for non-urgent requests.  _______________________________________________________  Due to recent changes in healthcare laws, you may see the results of your imaging and laboratory studies on MyChart before your provider has had a chance to review them.  We understand that in some cases there may be results that are confusing or concerning to you. Not all laboratory results come back in the same time frame and the provider may be waiting for multiple results in order to interpret others.  Please give [place] 48 hours in order for your provider to thoroughly  review all the results before contacting the office for clarification of your results.

## 2022-12-30 NOTE — Progress Notes (Signed)
   Subjective:    Patient ID: Hannah Vasquez, female    DOB: November 12, 1950, 72 y.o.   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  She is here today with her daughter and was last seen on 07/16/2022.  She reports she is doing very well.  She has been able to lose 12 pounds with portion control and dietary choices.  She is very happy with this result.  From a physical perspective she has had no abdominal pain.  She has remained on omeprazole 40 mg once daily without epigastric pain or pyrosis.  Bowel movements have been regular.  No blood in stool or melena.  She does use Vitamin E 800 IU daily.  She continues on meloxicam, gabapentin and tramadol for back pain and knee pain.  She was considering knee arthroplasty but this has been put on hold for now.   Review of Systems As per HPI, otherwise negative  Current Medications, Allergies, Past Medical History, Past Surgical History, Family History and Social History were reviewed in [employer] record.    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   MASH (fibrosis 2-3 by liver biopsy November 2022)/hepatic sarcoid/? PBC --she has done very well.  Her liver enzymes had previously normalized and we will repeat those today.  She did not tolerate ursodiol but has remained on Vitamin E.  She is also been successful at a 12 pound weight loss and her BMI is now normal.  She is congratulated on this achievement. -- Repeat CBC,  CMP and INR -- Continue Mediterranean type diet -- Continue vitamin E 800 IU daily -- Has been seen by [facility] but can follow-up with him in the future as needed  2.  Sarcoidosis --hepatic without evidence of pulmonary involvement.  See #1  3.  Dyspepsia with epigastric pain --resolved and she was able to successfully reduce to once daily omeprazole -- Continue omeprazole 40 mg daily for now particularly in the setting of chronic NSAIDs (meloxicam)  4.  Colon cancer screening --normal colonoscopy May 2018, repeat in May 2028  40-month follow-up, sooner if needed  30 minutes total spent today including patient facing time, coordination of care, reviewing medical history/procedures/pertinent radiology studies, and documentation of the encounter.

## 2022-12-31 ENCOUNTER — Other Ambulatory Visit: Payer: Self-pay | Admitting: Cardiology

## 2023-01-14 ENCOUNTER — Other Ambulatory Visit: Payer: Self-pay | Admitting: Internal Medicine

## 2023-01-18 ENCOUNTER — Ambulatory Visit: Payer: PPO | Admitting: Internal Medicine

## 2023-01-19 DIAGNOSIS — I2510 Atherosclerotic heart disease of native coronary artery without angina pectoris: Secondary | ICD-10-CM | POA: Diagnosis not present

## 2023-01-19 DIAGNOSIS — I5022 Chronic systolic (congestive) heart failure: Secondary | ICD-10-CM | POA: Diagnosis not present

## 2023-01-19 DIAGNOSIS — K7581 Nonalcoholic steatohepatitis (NASH): Secondary | ICD-10-CM | POA: Diagnosis not present

## 2023-01-19 DIAGNOSIS — I10 Essential (primary) hypertension: Secondary | ICD-10-CM | POA: Diagnosis not present

## 2023-01-19 DIAGNOSIS — D869 Sarcoidosis, unspecified: Secondary | ICD-10-CM | POA: Diagnosis not present

## 2023-01-19 DIAGNOSIS — M5442 Lumbago with sciatica, left side: Secondary | ICD-10-CM | POA: Diagnosis not present

## 2023-01-19 DIAGNOSIS — D72829 Elevated white blood cell count, unspecified: Secondary | ICD-10-CM | POA: Diagnosis not present

## 2023-01-19 DIAGNOSIS — M5441 Lumbago with sciatica, right side: Secondary | ICD-10-CM | POA: Diagnosis not present

## 2023-01-19 DIAGNOSIS — M25561 Pain in right knee: Secondary | ICD-10-CM | POA: Diagnosis not present

## 2023-01-19 DIAGNOSIS — M25562 Pain in left knee: Secondary | ICD-10-CM | POA: Diagnosis not present

## 2023-01-19 DIAGNOSIS — G8929 Other chronic pain: Secondary | ICD-10-CM | POA: Diagnosis not present

## 2023-01-24 DIAGNOSIS — D72829 Elevated white blood cell count, unspecified: Secondary | ICD-10-CM | POA: Diagnosis not present

## 2023-02-03 DIAGNOSIS — M5416 Radiculopathy, lumbar region: Secondary | ICD-10-CM | POA: Diagnosis not present

## 2023-03-10 DIAGNOSIS — M25562 Pain in left knee: Secondary | ICD-10-CM | POA: Diagnosis not present

## 2023-03-10 DIAGNOSIS — M5416 Radiculopathy, lumbar region: Secondary | ICD-10-CM | POA: Diagnosis not present

## 2023-03-14 ENCOUNTER — Ambulatory Visit: Payer: PPO | Admitting: Cardiology

## 2023-03-14 ENCOUNTER — Encounter: Payer: Self-pay | Admitting: Cardiology

## 2023-03-14 VITALS — BP 128/74 | HR 62 | Ht 62.0 in | Wt 154.4 lb

## 2023-03-14 DIAGNOSIS — Z01810 Encounter for preprocedural cardiovascular examination: Secondary | ICD-10-CM | POA: Diagnosis not present

## 2023-03-14 DIAGNOSIS — I2510 Atherosclerotic heart disease of native coronary artery without angina pectoris: Secondary | ICD-10-CM | POA: Diagnosis not present

## 2023-03-14 NOTE — Patient Instructions (Signed)

## 2023-03-14 NOTE — Progress Notes (Signed)
I am not this patient's PCP.

## 2023-03-14 NOTE — Progress Notes (Signed)
  Cardiology Office Note:  .   Date:  03/14/2023  ID:  Ray Church, DOB 10-Jan-1951, MRN [id number] PCP: Jerl Mina, MD  [facility] Providers Cardiologist:  Donato Schultz, MD Sleep Medicine:  Armanda Magic, MD    History of Present Illness: .   Hannah Vasquez is a 72 y.o. female here for follow-up of coronary artery disease mitral valve prolapse hypertension hyperlipidemia and OSA with recent L5-S1 surgery.  CAD-LAD STEMI May 2018 Right bundle branch block Ischemic cardiomyopathy-EF 40% but normalized post stent Carotid artery stenosis-Dr. Randie Heinz  Overall she has been doing very well.  No anginal symptoms.  She is somewhat limited by her knee and back.  She would like to try to proceed with surgery if possible.  She does get back injections.  ROS: No fever chills nausea vomiting syncope bleeding  Studies Reviewed: Marland Kitchen   EKG Interpretation Date/Time:  Monday March 14 2023 13:18:20 EDT Ventricular Rate:  62 PR Interval:  180 QRS Duration:  132 QT Interval:  452 QTC Calculation: 458 R Axis:   62  Text Interpretation: Normal sinus rhythm Right bundle branch block T wave abnormality, consider lateral ischemia When compared with ECG of 16-Nov-2021 10:18, T wave inversion less evident in Inferior leads Confirmed by Donato Schultz ([id number]) on 03/14/2023 1:24:45 PM    Echo 2021-EF 60 to 65% mitral valve excellent  Risk Assessment/Calculations:            Physical Exam:   VS:  BP 128/74   Pulse 62   Ht 5\' 2"  (1.575 m)   Wt 154 lb 6.4 oz (70 kg)   LMP  (LMP Unknown)   SpO2 97%   BMI 28.24 kg/m    Wt Readings from Last 3 Encounters:  03/14/23 154 lb 6.4 oz (70 kg)  12/30/22 152 lb (68.9 kg)  07/16/22 164 lb 5 oz (74.5 kg)    GEN: Well nourished, well developed in no acute distress NECK: No JVD; No carotid bruits CARDIAC: RRR, no murmurs, rubs, gallops RESPIRATORY:  Clear to auscultation without rales, wheezing or rhonchi  ABDOMEN: Soft, non-tender,  non-distended EXTREMITIES:  No edema; No deformity   ASSESSMENT AND PLAN: .    Coronary artery disease - LAD stent 2018 anterior STEMI.  2 separate stents were placed.  Small diagonal branch was jailed.  Subsequent pharmacologic stress test was normal after episodes of atypical chest pain. - Continue with goal-directed medical therapy aspirin beta-blocker high intensity statin.  Hyperlipidemia - Prior LDL 74.  Hypertension - Stable amlodipine 5 mg losartan 100 mg hydrochlorothiazide 25 mg metoprolol 25 mg isosorbide 30 mg furosemide 20 mg as needed  Mitral valve prolapse - Trivial anterior.  Shortness of breath - Pulmonary has seen in the past with Dr. Delton Coombes.  Preoperative risk - She may proceed with knee surgery or back surgery with low overall cardiac risk at this point.  She has been very stable post MI from 2018.  Pump function is normal.  She may proceed.        Dispo: 1 yr  Signed, Donato Schultz, MD

## 2023-03-15 DIAGNOSIS — M25561 Pain in right knee: Secondary | ICD-10-CM | POA: Diagnosis not present

## 2023-03-15 DIAGNOSIS — M25562 Pain in left knee: Secondary | ICD-10-CM | POA: Diagnosis not present

## 2023-03-15 DIAGNOSIS — M5459 Other low back pain: Secondary | ICD-10-CM | POA: Diagnosis not present

## 2023-03-18 DIAGNOSIS — M25561 Pain in right knee: Secondary | ICD-10-CM | POA: Diagnosis not present

## 2023-03-18 DIAGNOSIS — M5459 Other low back pain: Secondary | ICD-10-CM | POA: Diagnosis not present

## 2023-03-18 DIAGNOSIS — M25562 Pain in left knee: Secondary | ICD-10-CM | POA: Diagnosis not present

## 2023-03-22 ENCOUNTER — Other Ambulatory Visit: Payer: Self-pay | Admitting: Cardiology

## 2023-03-22 DIAGNOSIS — M5459 Other low back pain: Secondary | ICD-10-CM | POA: Diagnosis not present

## 2023-03-22 DIAGNOSIS — M25562 Pain in left knee: Secondary | ICD-10-CM | POA: Diagnosis not present

## 2023-03-22 DIAGNOSIS — M25561 Pain in right knee: Secondary | ICD-10-CM | POA: Diagnosis not present

## 2023-03-25 ENCOUNTER — Ambulatory Visit
Admission: EM | Admit: 2023-03-25 | Discharge: 2023-03-25 | Disposition: A | Discharge status: Home / Self Care | Payer: PPO | Attending: Family Medicine | Admitting: Family Medicine

## 2023-03-25 DIAGNOSIS — L309 Dermatitis, unspecified: Secondary | ICD-10-CM | POA: Diagnosis not present

## 2023-03-25 MED ORDER — TRIAMCINOLONE ACETONIDE 0.1 % EX CREA: 1.0000 | TOPICAL_CREAM | Freq: Two times a day (BID) | CUTANEOUS | 0 refills | Status: AC | PRN

## 2023-03-25 MED ORDER — DEXAMETHASONE SODIUM PHOSPHATE 10 MG/ML IJ SOLN
10.0000 mg | Freq: Once | INTRAMUSCULAR | Status: AC
  Administered 2023-03-25: 10 mg via INTRAMUSCULAR

## 2023-03-25 NOTE — Discharge Instructions (Signed)
Take Benadryl as needed for any residual itching.  You received a steroid shot this is a long-acting injection that will stay in your system for 24 to 48 hours. I have also prescribed a topical cream that can help minimize itching and irritation if you continue to have residual itching after the injection.

## 2023-03-25 NOTE — ED Provider Notes (Signed)
Renaldo Fiddler    CSN: [id number] Arrival date & time: 03/25/23  1111      History   Chief Complaint Chief Complaint  Patient presents with   Rash    HPI Hannah Vasquez is a 72 y.o. female.   HPI Patient presents with a pruritic rash x 4 days.  The rash is localized to her lower back, abdomen, and upper chest.  Patient denies that she has been in contact with any outdoor irritants. She has been treating symptoms with over-the-counter Cortisone cream without any improvement.  Patient is unable to identify source of rash.  Past Medical History:  Diagnosis Date   Advanced hepatic fibrosis    Allergy    Anxiety    CAD (coronary artery disease)    Cataract    Chronic systolic heart failure (HCC)    DDD (degenerative disc disease), thoracolumbar    Does use hearing aid    bilateral as of 10/05/2019   Elevated alkaline phosphatase level    Gallstones    Hemorrhoids    Hepatic steatosis    History of MI (myocardial infarction) 10/07/2017   Hypertension    Mitral valve prolapse    NASH (nonalcoholic steatohepatitis)    OSA (obstructive sleep apnea)    Osteoporosis    RBBB    Sarcoidosis    Status post dilation of esophageal narrowing     Patient Active Problem List   Diagnosis Date Noted   Left-sided face pain 11/18/2021   Dizziness, nonspecific 11/03/2021   Decreased grip strength of left hand 11/03/2021   Sarcoidosis 09/10/2021   Thyroid nodule 09/10/2021   Urinary urgency 05/21/2021   Vitamin B12 deficiency 01/29/2021   Cough 07/29/2020   OSA (obstructive sleep apnea) 06/25/2020   Early satiety 04/29/2020   Upper abdominal pain 04/29/2020   Nausea without vomiting 04/29/2020   Prediabetes 03/25/2020   Nocturia 03/25/2020   Insomnia 03/25/2020   Reactive airway disease 12/12/2019   Neuropathy 12/12/2019   Chronic systolic heart failure (HCC) 07/11/2019   Elevated serum alkaline phosphatase level 07/10/2019   Leukocytosis 07/10/2019   Bilateral hand  pain 04/09/2019   Bilateral hearing loss 04/09/2019   Trigger finger, left middle finger 10/30/2018   Lumbar back pain with radiculopathy affecting lower extremity 05/22/2018   Osteopenia 05/15/2018   Lumbar radiculopathy 04/27/2018   GERD (gastroesophageal reflux disease) 04/10/2018   Shortness of breath 08/21/2017   DDD (degenerative disc disease), thoracolumbar 04/28/2017   Back pain 04/21/2017   Hypokalemia 01/06/2017   History of non-ST elevation myocardial infarction (NSTEMI) 01/01/2017   Fatigue 11/27/2014   RBBB 12/19/2013   Mitral valve prolapse    HYPERTENSION, BENIGN 08/27/2010   Anxiety state 06/27/2009   Coronary atherosclerosis 06/27/2009   Mitral valve disorder 06/27/2009   HEMORRHOIDS 06/27/2009   Allergic rhinitis 06/27/2009   Disorder of bone and cartilage 11/11/2006    Past Surgical History:  Procedure Laterality Date   BASAL CELL CARCINOMA EXCISION  02/05/2016   Dr. Irene Limbo, [facility]   BLADDER SURGERY  11/15/1995   bladder neck suspension, urinary incontinence   CARDIAC CATHETERIZATION  01/03/2017 DES LAD   CATARACT EXTRACTION Left 04/30/2016   Dr. Darel Hong, [facility]   CORONARY STENT INTERVENTION N/A 01/03/2017   Procedure: Coronary Stent Intervention;  Surgeon: Yvonne Kendall, MD;  [facility]: [facility];  Service: Cardiovascular;  Laterality: N/A;   CORONARY ULTRASOUND/IVUS N/A 01/03/2017   Procedure: Intravascular Ultrasound/IVUS;  Surgeon: Yvonne Kendall, MD;  Location: [facility] INVAS[facility]  CV LAB;  Service: Cardiovascular;  Laterality: N/A;   DENTAL SURGERY  06/20/2016   Tooth Implant; Dr. Azucena Cecil   KNEE SURGERY  2003   torn meniscus   LEFT HEART CATH AND CORONARY ANGIOGRAPHY N/A 01/03/2017   Procedure: Left Heart Cath and Coronary Angiography;  Surgeon: Yvonne Kendall, MD;  Location: [facility] INV[facility] CV LAB;  Service: Cardiovascular;  Laterality: N/A;   MOHS SURGERY Right 2017   right side of face   ROTATOR CUFF REPAIR     SHOULDER  SURGERY Right 2005    OB History     Gravida  2   Para  2   Term  2   Preterm      AB      Living  2      SAB      IAB      Ectopic      Multiple      Live Births  2            Home Medications    Prior to Admission medications   Medication Sig Start Date End Date Taking? Authorizing Provider  acetaminophen (TYLENOL) 500 MG tablet Take 500 mg by mouth every 6 (six) hours as needed for moderate pain.   Yes [provider]  albuterol (VENTOLIN HFA) 108 (90 Base) MCG/ACT inhaler Inhale 1 puff into the lungs every 6 (six) hours as needed for wheezing or shortness of breath. 12/12/19  Yes Gweneth Dimitri, MD  amLODipine (NORVASC) 5 MG tablet Take 1 tablet (5 mg total) by mouth daily. 06/25/22  Yes Jake Bathe, MD  aspirin EC 81 MG tablet Take 81 mg by mouth every morning.    Yes [provider]  atorvastatin (LIPITOR) 80 MG tablet TAKE 1 TABLET BY MOUTH DAILY AT 6 PM 04/27/22  Yes Jake Bathe, MD  Calcium-Magnesium-Vitamin D (CALCIUM 1200+D3 PO) Take 1,200 mg by mouth daily.   Yes [provider]  fluticasone (FLONASE) 50 MCG/ACT nasal spray Place 1 spray into both nostrils daily as needed for allergies.   Yes [provider]  furosemide (LASIX) 20 MG tablet Take 1 tablet (20 mg total) by mouth daily as needed. 01/01/19  Yes Jake Bathe, MD  gabapentin (NEURONTIN) 300 MG capsule Take 1 capsule (300 mg total) by mouth 3 (three) times daily. 05/21/21  Yes Gweneth Dimitri, MD  isosorbide mononitrate (IMDUR) 30 MG 24 hr tablet TAKE 1 TABLET BY MOUTH DAILY 07/20/22  Yes Jake Bathe, MD  loratadine (CLARITIN) 10 MG tablet Take 1 tablet (10 mg total) by mouth every morning. 11/08/13  Yes Dianne Dun, MD  losartan (COZAAR) 100 MG tablet TAKE ONE TABLET BY MOUTH ONCE A DAY 03/22/23  Yes Jake Bathe, MD  meloxicam (MOBIC) 7.5 MG tablet Take 1 tablet (7.5 mg total) by mouth daily. 11/18/21  Yes Gweneth Dimitri, MD  metoprolol succinate  (TOPROL-XL) 25 MG 24 hr tablet Take 1 tablet (25 mg total) by mouth daily. 09/28/22  Yes Jake Bathe, MD  Multiple Vitamin (MULTIVITAMIN) tablet Take 1 tablet by mouth every morning.    Yes [provider]  Omega-3 Fatty Acids (FISH OIL) 1200 MG CAPS Take 1,200 mg by mouth every morning.   Yes [provider]  omeprazole (PRILOSEC) 40 MG capsule Take 1 capsule (40 mg total) by mouth daily. 01/14/23  Yes Pyrtle, Carie Caddy, MD  potassium chloride SA (KLOR-CON M) 20 MEQ tablet TAKE 2 TABLETS BY MOUTH TWICE DAILY  Patient taking differently: 20 mEq daily. 06/25/22  Yes Jake Bathe, MD  spironolactone (ALDACTONE) 25 MG tablet TAKE ONE TABLET BY MOUTH ONCE A DAY 12/31/22  Yes Jake Bathe, MD  tiZANidine (ZANAFLEX) 4 MG tablet Take by mouth. 03/01/22  Yes [provider]  traMADol (ULTRAM) 50 MG tablet Take 1 tablet (50 mg total) by mouth every 6 (six) hours as needed for severe pain. 06/09/22  Yes Worthy Rancher B, FNP  triamcinolone cream (KENALOG) 0.1 % Apply 1 Application topically 2 (two) times daily as needed. 03/25/23  Yes Bing Neighbors, NP  vitamin E 180 MG (400 UNITS) capsule Take 800 Units by mouth in the morning and at bedtime.   Yes [provider]  MYRBETRIQ 50 MG TB24 tablet Take 50 mg by mouth daily. Patient not taking: Reported on 03/14/2023 09/28/21   [provider]  nitroGLYCERIN (NITROSTAT) 0.4 MG SL tablet Place 1 tablet (0.4 mg total) under the tongue every 5 (five) minutes as needed for chest pain. 06/23/21   Jake Bathe, MD  ondansetron (ZOFRAN-ODT) 4 MG disintegrating tablet Take 1 tablet (4 mg total) by mouth every 8 (eight) hours as needed for nausea or vomiting. 04/20/22   Gweneth Dimitri, MD    Family History Family History  Problem Relation Age of Onset   Mitral valve prolapse Mother    Hypertension Mother    Hypertension Father    Brain cancer Father    Colon cancer Neg Hx    Stomach cancer Neg Hx    Esophageal cancer Neg Hx     Pancreatic cancer Neg Hx     Social History Social History   Tobacco Use   Smoking status: Never   Smokeless tobacco: Never  Vaping Use   Vaping status: Never Used  Substance Use Topics   Alcohol use: No    Alcohol/week: 0.0 standard drinks of alcohol   Drug use: No     Allergies   Promethazine-dm, Bactroban [mupirocin calcium], Doxycycline, Codeine, Valtrex [valacyclovir hcl], and Penicillins   Review of Systems Review of Systems Pertinent negatives listed in HPI  Physical Exam Triage Vital Signs ED Triage Vitals  Encounter Vitals Group     BP 03/25/23 1215 130/63     Systolic BP Percentile --      Diastolic BP Percentile --      Pulse Rate 03/25/23 1215 61     Resp 03/25/23 1215 18     Temp 03/25/23 1215 98.2 F (36.8 C)     Temp src --      SpO2 03/25/23 1215 96 %     Weight --      Height --      Head Circumference --      Peak Flow --      Pain Score 03/25/23 1237 0     Pain Loc --      Pain Education --      Exclude from Growth Chart --    No data found.  Updated Vital Signs BP 130/63   Pulse 61   Temp 98.2 F (36.8 C)   Resp 18   LMP  (LMP Unknown)   SpO2 96%   Visual Acuity Right Eye Distance:   Left Eye Distance:   Bilateral Distance:    Right Eye Near:   Left Eye Near:    Bilateral Near:     Physical Exam Vitals and nursing note reviewed.  Constitutional:      Appearance: Normal  appearance.  HENT:     Nose: Nose normal.  Eyes:     Extraocular Movements: Extraocular movements intact.     Pupils: Pupils are equal, round, and reactive to light.  Cardiovascular:     Rate and Rhythm: Normal rate and regular rhythm.  Pulmonary:     Effort: Pulmonary effort is normal.     Breath sounds: Normal breath sounds.  Skin:    General: Skin is warm.     Capillary Refill: Capillary refill takes less than 2 seconds.     Findings: Rash present. Rash is papular.  Neurological:     General: No focal deficit present.     Mental Status:  She is alert.      UC Treatments / Results  Labs (all labs ordered are listed, but only abnormal results are displayed) Labs Reviewed - No data to display  EKG   Radiology No results found.  Procedures Procedures (including critical care time)  Medications Ordered in UC Medications  dexamethasone (DECADRON) injection 10 mg (10 mg Intramuscular Given 03/25/23 1304)    Initial Impression / Assessment and Plan / UC Course  I have reviewed the triage vital signs and the nursing notes.  Pertinent labs & imaging results that were available during my care of the patient were reviewed by me and considered in my medical decision making (see chart for details).    Dermatitis, Decadron 10 mg IM given here in clinic. Continue management of itchiness associated with rash with topical steroid cream. Encouraged to launder bedding as the source of irritant is unknown. Return precautions given. Final Clinical Impressions(s) / UC Diagnoses   Final diagnoses:  Dermatitis     Discharge Instructions      Take Benadryl as needed for any residual itching.  You received a steroid shot this is a long-acting injection that will stay in your system for 24 to 48 hours. I have also prescribed a topical cream that can help minimize itching and irritation if you continue to have residual itching after the injection.    ED Prescriptions     Medication Sig Dispense Auth. Provider   triamcinolone cream (KENALOG) 0.1 % Apply 1 Application topically 2 (two) times daily as needed. 160 g Bing Neighbors, NP      PDMP not reviewed this encounter.   Bing Neighbors, NP 03/28/23 [phone]

## 2023-03-25 NOTE — ED Triage Notes (Signed)
Pt presents with small red itchy bumps on her right back and hip and upper breast and left side that started 4 days ago.

## 2023-03-29 DIAGNOSIS — M25562 Pain in left knee: Secondary | ICD-10-CM | POA: Diagnosis not present

## 2023-03-29 DIAGNOSIS — M25561 Pain in right knee: Secondary | ICD-10-CM | POA: Diagnosis not present

## 2023-03-29 DIAGNOSIS — M5459 Other low back pain: Secondary | ICD-10-CM | POA: Diagnosis not present

## 2023-04-05 DIAGNOSIS — M25561 Pain in right knee: Secondary | ICD-10-CM | POA: Diagnosis not present

## 2023-04-05 DIAGNOSIS — M5459 Other low back pain: Secondary | ICD-10-CM | POA: Diagnosis not present

## 2023-04-05 DIAGNOSIS — M25562 Pain in left knee: Secondary | ICD-10-CM | POA: Diagnosis not present

## 2023-04-12 DIAGNOSIS — M5459 Other low back pain: Secondary | ICD-10-CM | POA: Diagnosis not present

## 2023-04-12 DIAGNOSIS — M25561 Pain in right knee: Secondary | ICD-10-CM | POA: Diagnosis not present

## 2023-04-12 DIAGNOSIS — M25562 Pain in left knee: Secondary | ICD-10-CM | POA: Diagnosis not present

## 2023-04-21 DIAGNOSIS — M25561 Pain in right knee: Secondary | ICD-10-CM | POA: Diagnosis not present

## 2023-04-21 DIAGNOSIS — M5459 Other low back pain: Secondary | ICD-10-CM | POA: Diagnosis not present

## 2023-04-21 DIAGNOSIS — M25562 Pain in left knee: Secondary | ICD-10-CM | POA: Diagnosis not present

## 2023-04-26 DIAGNOSIS — M5459 Other low back pain: Secondary | ICD-10-CM | POA: Diagnosis not present

## 2023-04-26 DIAGNOSIS — M25561 Pain in right knee: Secondary | ICD-10-CM | POA: Diagnosis not present

## 2023-04-26 DIAGNOSIS — M25562 Pain in left knee: Secondary | ICD-10-CM | POA: Diagnosis not present

## 2023-04-27 ENCOUNTER — Other Ambulatory Visit: Payer: Self-pay | Admitting: Cardiology

## 2023-04-27 DIAGNOSIS — I10 Essential (primary) hypertension: Secondary | ICD-10-CM

## 2023-04-28 ENCOUNTER — Ambulatory Visit: Payer: PPO | Admitting: Emergency Medicine

## 2023-05-03 DIAGNOSIS — M25551 Pain in right hip: Secondary | ICD-10-CM | POA: Diagnosis not present

## 2023-05-09 ENCOUNTER — Other Ambulatory Visit: Payer: Self-pay | Admitting: Cardiology

## 2023-05-11 DIAGNOSIS — D869 Sarcoidosis, unspecified: Secondary | ICD-10-CM | POA: Diagnosis not present

## 2023-05-11 DIAGNOSIS — G8929 Other chronic pain: Secondary | ICD-10-CM | POA: Diagnosis not present

## 2023-05-11 DIAGNOSIS — R6889 Other general symptoms and signs: Secondary | ICD-10-CM | POA: Diagnosis not present

## 2023-05-11 DIAGNOSIS — I10 Essential (primary) hypertension: Secondary | ICD-10-CM | POA: Diagnosis not present

## 2023-05-11 DIAGNOSIS — I2510 Atherosclerotic heart disease of native coronary artery without angina pectoris: Secondary | ICD-10-CM | POA: Diagnosis not present

## 2023-05-11 DIAGNOSIS — K7581 Nonalcoholic steatohepatitis (NASH): Secondary | ICD-10-CM | POA: Diagnosis not present

## 2023-05-11 DIAGNOSIS — M5441 Lumbago with sciatica, right side: Secondary | ICD-10-CM | POA: Diagnosis not present

## 2023-05-11 DIAGNOSIS — I5022 Chronic systolic (congestive) heart failure: Secondary | ICD-10-CM | POA: Diagnosis not present

## 2023-05-11 DIAGNOSIS — J309 Allergic rhinitis, unspecified: Secondary | ICD-10-CM | POA: Diagnosis not present

## 2023-05-11 DIAGNOSIS — M5442 Lumbago with sciatica, left side: Secondary | ICD-10-CM | POA: Diagnosis not present

## 2023-05-11 DIAGNOSIS — J4520 Mild intermittent asthma, uncomplicated: Secondary | ICD-10-CM | POA: Diagnosis not present

## 2023-05-16 DIAGNOSIS — M545 Low back pain, unspecified: Secondary | ICD-10-CM | POA: Diagnosis not present

## 2023-05-26 DIAGNOSIS — M47816 Spondylosis without myelopathy or radiculopathy, lumbar region: Secondary | ICD-10-CM | POA: Diagnosis not present

## 2023-06-07 DIAGNOSIS — Z23 Encounter for immunization: Secondary | ICD-10-CM | POA: Diagnosis not present

## 2023-06-15 ENCOUNTER — Ambulatory Visit: Payer: PPO | Admitting: Vascular Surgery

## 2023-06-15 ENCOUNTER — Other Ambulatory Visit: Payer: Self-pay | Admitting: Cardiology

## 2023-06-15 ENCOUNTER — Encounter (HOSPITAL_COMMUNITY): Payer: PPO

## 2023-06-16 DIAGNOSIS — M47816 Spondylosis without myelopathy or radiculopathy, lumbar region: Secondary | ICD-10-CM | POA: Diagnosis not present

## 2023-07-04 DIAGNOSIS — M47816 Spondylosis without myelopathy or radiculopathy, lumbar region: Secondary | ICD-10-CM | POA: Diagnosis not present

## 2023-07-19 DIAGNOSIS — M47816 Spondylosis without myelopathy or radiculopathy, lumbar region: Secondary | ICD-10-CM | POA: Diagnosis not present

## 2023-07-25 DIAGNOSIS — I5022 Chronic systolic (congestive) heart failure: Secondary | ICD-10-CM | POA: Diagnosis not present

## 2023-07-25 DIAGNOSIS — I10 Essential (primary) hypertension: Secondary | ICD-10-CM | POA: Diagnosis not present

## 2023-07-25 DIAGNOSIS — J4520 Mild intermittent asthma, uncomplicated: Secondary | ICD-10-CM | POA: Diagnosis not present

## 2023-07-25 DIAGNOSIS — M25562 Pain in left knee: Secondary | ICD-10-CM | POA: Diagnosis not present

## 2023-07-25 DIAGNOSIS — D869 Sarcoidosis, unspecified: Secondary | ICD-10-CM | POA: Diagnosis not present

## 2023-07-25 DIAGNOSIS — I25119 Atherosclerotic heart disease of native coronary artery with unspecified angina pectoris: Secondary | ICD-10-CM | POA: Diagnosis not present

## 2023-07-25 DIAGNOSIS — K7581 Nonalcoholic steatohepatitis (NASH): Secondary | ICD-10-CM | POA: Diagnosis not present

## 2023-07-25 DIAGNOSIS — M25561 Pain in right knee: Secondary | ICD-10-CM | POA: Diagnosis not present

## 2023-07-27 ENCOUNTER — Other Ambulatory Visit: Payer: Self-pay | Admitting: Internal Medicine

## 2023-07-27 ENCOUNTER — Other Ambulatory Visit: Payer: Self-pay | Admitting: Cardiology

## 2023-07-29 DIAGNOSIS — R413 Other amnesia: Secondary | ICD-10-CM | POA: Diagnosis not present

## 2023-07-29 DIAGNOSIS — I25119 Atherosclerotic heart disease of native coronary artery with unspecified angina pectoris: Secondary | ICD-10-CM | POA: Diagnosis not present

## 2023-07-29 DIAGNOSIS — Z01818 Encounter for other preprocedural examination: Secondary | ICD-10-CM | POA: Diagnosis not present

## 2023-08-25 ENCOUNTER — Other Ambulatory Visit: Payer: Self-pay | Admitting: Internal Medicine

## 2023-08-26 DIAGNOSIS — M47816 Spondylosis without myelopathy or radiculopathy, lumbar region: Secondary | ICD-10-CM | POA: Diagnosis not present

## 2023-09-07 ENCOUNTER — Other Ambulatory Visit: Payer: Self-pay

## 2023-09-07 DIAGNOSIS — I6522 Occlusion and stenosis of left carotid artery: Secondary | ICD-10-CM

## 2023-09-21 ENCOUNTER — Other Ambulatory Visit: Payer: Self-pay | Admitting: Internal Medicine

## 2023-09-21 ENCOUNTER — Ambulatory Visit: Payer: PPO | Admitting: Vascular Surgery

## 2023-09-21 ENCOUNTER — Other Ambulatory Visit: Payer: Self-pay | Admitting: Cardiology

## 2023-09-21 ENCOUNTER — Ambulatory Visit (HOSPITAL_COMMUNITY)
Admission: RE | Admit: 2023-09-21 | Discharge: 2023-09-21 | Disposition: A | Discharge status: Home / Self Care | Payer: PPO | Source: Ambulatory Visit | Attending: Vascular Surgery | Admitting: Vascular Surgery

## 2023-09-21 ENCOUNTER — Encounter: Payer: Self-pay | Admitting: Vascular Surgery

## 2023-09-21 VITALS — BP 146/78 | HR 56 | Temp 98.0°F | Ht 62.0 in | Wt 160.0 lb

## 2023-09-21 DIAGNOSIS — I6522 Occlusion and stenosis of left carotid artery: Secondary | ICD-10-CM | POA: Insufficient documentation

## 2023-09-21 NOTE — Progress Notes (Signed)
 Patient ID: Hannah Vasquez, female   DOB: 01/14/1951, 73 y.o.   MRN: [id number]  Reason for Consult: Follow-up   Referred by Valora Agent, MD  Subjective:     HPI:  Hannah Vasquez is a 73 y.o. female follows up for known carotid stenosis.  I initially saw her 2 years ago with concern for dizziness and carotid stenosis.  She continues to deny history of stroke, TIA or amaurosis and states that her dizziness is improved.  She does have hypertension and hyperlipidemia as well as coronary artery disease.  She takes an aspirin  and a statin daily.  Past Medical History:  Diagnosis Date   Advanced hepatic fibrosis    Allergy    Anxiety    CAD (coronary artery disease)    Cataract    Chronic systolic heart failure (HCC)    DDD (degenerative disc disease), thoracolumbar    Does use hearing aid    bilateral as of 10/05/2019   Elevated alkaline phosphatase level    Gallstones    Hemorrhoids    Hepatic steatosis    History of MI (myocardial infarction) 10/07/2017   Hypertension    Mitral valve prolapse    NASH (nonalcoholic steatohepatitis)    OSA (obstructive sleep apnea)    Osteoporosis    RBBB    Sarcoidosis    Status post dilation of esophageal narrowing    Family History  Problem Relation Age of Onset   Mitral valve prolapse Mother    Hypertension Mother    Hypertension Father    Brain cancer Father    Colon cancer Neg Hx    Stomach cancer Neg Hx    Esophageal cancer Neg Hx    Pancreatic cancer Neg Hx    Past Surgical History:  Procedure Laterality Date   BASAL CELL CARCINOMA EXCISION  02/05/2016   Dr. Jadine, [facility] D[facility]at[facility]   BLADDER SURGERY  11/15/1995   bladder neck suspension, urinary incontinence   CARDIAC CATHETERIZATION  01/03/2017 DES LAD   CATARACT EXTRACTION Left 04/30/2016   Dr. Milan, [facility]   CORONARY STENT INTERVENTION N/A 01/03/2017   Procedure: Coronary Stent Intervention;  Surgeon: Mady Bruckner, MD;  [facility];   Service: Cardiovascular;  Laterality: N/A;   CORONARY ULTRASOUND/IVUS N/A 01/03/2017   Procedure: Intravascular Ultrasound/IVUS;  Surgeon: Mady Bruckner, MD;  [facility]: [facility];  Service: Cardiovascular;  Laterality: N/A;   DENTAL SURGERY  06/20/2016   Tooth Implant; Dr. Ann   KNEE SURGERY  2003   torn meniscus   LEFT HEART CATH AND CORONARY ANGIOGRAPHY N/A 01/03/2017   Procedure: Left Heart Cath and Coronary Angiography;  Surgeon: Mady Bruckner, MD;  Location: [facility];  Service: Cardiovascular;  Laterality: N/A;   MOHS SURGERY Right 2017   right side of face   ROTATOR CUFF REPAIR     SHOULDER SURGERY Right 2005    Short Social History:  Social History   Tobacco Use   Smoking status: Never   Smokeless tobacco: Never  Substance Use Topics   Alcohol use: No    Alcohol/week: 0.0 standard drinks of alcohol    Allergies  Allergen Reactions   Promethazine -Dm Nausea And Vomiting   Bactroban  [Mupirocin  Calcium ]     Facial redness and swelling   Doxycycline  Nausea And Vomiting   Codeine Nausea And Vomiting   Valtrex  [Valacyclovir  Hcl] Swelling   Penicillins Nausea And Vomiting and Rash    Current Outpatient Medications  Medication Sig  Dispense Refill   acetaminophen  (TYLENOL ) 500 MG tablet Take 500 mg by mouth every 6 (six) hours as needed for moderate pain.     albuterol  (VENTOLIN  HFA) 108 (90 Base) MCG/ACT inhaler Inhale 1 puff into the lungs every 6 (six) hours as needed for wheezing or shortness of breath. 18 g 1   amLODipine  (NORVASC ) 5 MG tablet TAKE ONE TABLET BY MOUTH ONCE A DAY 90 tablet 2   aspirin  EC 81 MG tablet Take 81 mg by mouth every morning.      atorvastatin  (LIPITOR ) 80 MG tablet TAKE ONE TABLET BY MOUTH DAILY AT SIX IN THE EVENING 90 tablet 3   Calcium -Magnesium-Vitamin D  (CALCIUM  1200+D3 PO) Take 1,200 mg by mouth daily.     fluticasone  (FLONASE ) 50 MCG/ACT nasal spray Place 1 spray into both nostrils daily as needed for  allergies.     furosemide  (LASIX ) 20 MG tablet Take 1 tablet (20 mg total) by mouth daily as needed. 90 tablet 3   gabapentin  (NEURONTIN ) 300 MG capsule Take 1 capsule (300 mg total) by mouth 3 (three) times daily. 270 capsule 3   isosorbide  mononitrate (IMDUR ) 30 MG 24 hr tablet TAKE ONE TABLET BY MOUTH DAILY 90 tablet 1   loratadine  (CLARITIN ) 10 MG tablet Take 1 tablet (10 mg total) by mouth every morning. 30 tablet 2   losartan  (COZAAR ) 100 MG tablet TAKE ONE TABLET BY MOUTH ONCE A DAY 90 tablet 3   meloxicam  (MOBIC ) 7.5 MG tablet Take 1 tablet (7.5 mg total) by mouth daily. 14 tablet 0   metoprolol  succinate (TOPROL -XL) 25 MG 24 hr tablet TAKE ONE TABLET BY MOUTH ONCE A DAY 90 tablet 3   Multiple Vitamin (MULTIVITAMIN) tablet Take 1 tablet by mouth every morning.      MYRBETRIQ 50 MG TB24 tablet Take 50 mg by mouth daily.     nitroGLYCERIN  (NITROSTAT ) 0.4 MG SL tablet DISSOLVE ONE TABLET UNDER TONGUE AS NEEDED FOR CHEST PAIN. MAY REPEAT FIVE MINUTES APART THREE TIMES IF NEEDED 25 tablet 0   Omega-3 Fatty Acids (FISH OIL ) 1200 MG CAPS Take 1,200 mg by mouth every morning.     omeprazole  (PRILOSEC) 40 MG capsule TAKE ONE CAPSULE (40 MG TOTAL) BY MOUTH DAILY. PER MD: PATIENT NEEDS AN APPOINTMENT FOR FUTURE REFILLS 30 capsule 0   ondansetron  (ZOFRAN -ODT) 4 MG disintegrating tablet Take 1 tablet (4 mg total) by mouth every 8 (eight) hours as needed for nausea or vomiting. 10 tablet 0   potassium chloride  SA (KLOR-CON  M) 20 MEQ tablet TAKE 2 TABLETS BY MOUTH TWICE DAILY (Patient taking differently: 20 mEq daily.) 360 tablet 2   spironolactone  (ALDACTONE ) 25 MG tablet TAKE ONE TABLET BY MOUTH ONCE A DAY (KEEP FOLLOW UP WITH DOCTOR SKAINS FOR FURTHER REFILLS) 90 tablet 0   tiZANidine  (ZANAFLEX ) 4 MG tablet Take by mouth.     traMADol  (ULTRAM ) 50 MG tablet Take 1 tablet (50 mg total) by mouth every 6 (six) hours as needed for severe pain. 60 tablet 0   triamcinolone  cream (KENALOG ) 0.1 % Apply 1  Application topically 2 (two) times daily as needed. 160 g 0   vitamin E 180 MG (400 UNITS) capsule Take 800 Units by mouth in the morning and at bedtime.     No current facility-administered medications for this visit.    Review of Systems  Constitutional:  Constitutional negative. HENT: HENT negative.  Eyes: Eyes negative.  Respiratory: Respiratory negative.  Cardiovascular: Cardiovascular negative.  GI: Gastrointestinal negative.  Musculoskeletal: Musculoskeletal negative.  Skin: Skin negative.  Neurological: Positive for dizziness.  Hematologic: Hematologic/lymphatic negative.  Psychiatric: Psychiatric negative.        Objective:  Objective   Vitals:   09/21/23 1135  BP: (!) 146/78  Pulse: (!) 56  Temp: 98 F (36.7 C)  Weight: 160 lb (72.6 kg)  Height: 5' 2 (1.575 m)   Body mass index is 29.26 kg/m.  Physical Exam HENT:     Head: Normocephalic.     Nose: Nose normal.     Mouth/Throat:     Mouth: Mucous membranes are moist.  Eyes:     Pupils: Pupils are equal, round, and reactive to light.  Neck:     Vascular: No carotid bruit.  Cardiovascular:     Rate and Rhythm: Normal rate.     Pulses: Normal pulses.  Pulmonary:     Effort: Pulmonary effort is normal.  Abdominal:     General: Abdomen is flat.  Musculoskeletal:     Right lower leg: No edema.     Left lower leg: No edema.  Skin:    General: Skin is warm.     Capillary Refill: Capillary refill takes less than 2 seconds.  Neurological:     General: No focal deficit present.     Mental Status: She is alert.  Psychiatric:        Mood and Affect: Mood normal.        Thought Content: Thought content normal.        Judgment: Judgment normal.     Data: Right Carotid Findings:  +----------+--------+--------+--------+------------------+---------------+           PSV cm/sEDV cm/sStenosisPlaque DescriptionComments          +----------+--------+--------+--------+------------------+---------------+  CCA Prox  95      17                                                 +----------+--------+--------+--------+------------------+---------------+  CCA Mid   94      16                                                 +----------+--------+--------+--------+------------------+---------------+  CCA Distal73      16                                                 +----------+--------+--------+--------+------------------+---------------+  ICA Prox  97      19      1-39%   heterogenous      minimal disease  +----------+--------+--------+--------+------------------+---------------+  ICA Mid   68      16                                                 +----------+--------+--------+--------+------------------+---------------+  ICA Distal80      22                                                 +----------+--------+--------+--------+------------------+---------------+  ECA      106     12                                                 +----------+--------+--------+--------+------------------+---------------+   +----------+--------+-------+----------------+-------------------+           PSV cm/sEDV cmsDescribe        Arm Pressure (mmHG)  +----------+--------+-------+----------------+-------------------+  [id number]           Multiphasic, WNL                     +----------+--------+-------+----------------+-------------------+   +---------+--------+--+--------+-+---------+  VertebralPSV cm/s45EDV cm/s9Antegrade  +---------+--------+--+--------+-+---------+      Left Carotid Findings:  +----------+--------+--------+--------+------------------+----------------+            PSV cm/sEDV cm/sStenosisPlaque DescriptionComments           +----------+--------+--------+--------+------------------+----------------+   CCA Prox  120     17                                                    +----------+--------+--------+--------+------------------+----------------+   CCA Mid   75      17                                                   +----------+--------+--------+--------+------------------+----------------+   CCA Distal81      17                                                   +----------+--------+--------+--------+------------------+----------------+   ICA Prox  151     42      40-59%  heterogenous      low end of  range  +----------+--------+--------+--------+------------------+----------------+   ICA Mid   89      28                                                   +----------+--------+--------+--------+------------------+----------------+   ICA Distal72      23                                                   +----------+--------+--------+--------+------------------+----------------+   ECA      94      13                                                   +----------+--------+--------+--------+------------------+----------------+    +----------+--------+--------+----------------+-------------------+           PSV cm/sEDV cm/sDescribe  Arm Pressure (mmHG)  +----------+--------+--------+----------------+-------------------+  Subclavian209            Multiphasic, WNL                     +----------+--------+--------+----------------+-------------------+   +---------+--------+--+--------+--+---------+  VertebralPSV cm/s43EDV cm/s14Antegrade  +---------+--------+--+--------+--+---------+        Summary:  Right Carotid: Velocities in the right ICA are consistent with a 1-39%  stenosis.   Left Carotid: Velocities in the left ICA are consistent with a 40-59%  stenosis.   Vertebrals: Bilateral vertebral arteries demonstrate antegrade flow.  Subclavians: Normal flow hemodynamics were seen in bilateral subclavian               arteries.        Assessment/Plan:     73 year old female with bilateral carotid stenosis on the left measuring 40 to 59% which is consistent with her previous CT angio.  She remains asymptomatic and is on antiplatelet and statin therapy.  We discussed the signs and symptoms of stroke that would require emergent medical evaluation and she demonstrates good understanding and sure that I will see her in 1 year with repeat carotid duplex.     Penne Lonni Colorado MD [facility]

## 2023-09-23 DIAGNOSIS — E538 Deficiency of other specified B group vitamins: Secondary | ICD-10-CM | POA: Diagnosis not present

## 2023-09-23 DIAGNOSIS — R8290 Unspecified abnormal findings in urine: Secondary | ICD-10-CM | POA: Diagnosis not present

## 2023-09-29 ENCOUNTER — Other Ambulatory Visit: Payer: Self-pay | Admitting: Internal Medicine

## 2023-09-30 DIAGNOSIS — E538 Deficiency of other specified B group vitamins: Secondary | ICD-10-CM | POA: Diagnosis not present

## 2023-10-04 DIAGNOSIS — K2100 Gastro-esophageal reflux disease with esophagitis, without bleeding: Secondary | ICD-10-CM | POA: Diagnosis not present

## 2023-10-04 DIAGNOSIS — J4540 Moderate persistent asthma, uncomplicated: Secondary | ICD-10-CM | POA: Diagnosis not present

## 2023-10-04 DIAGNOSIS — R0602 Shortness of breath: Secondary | ICD-10-CM | POA: Diagnosis not present

## 2023-10-07 DIAGNOSIS — E538 Deficiency of other specified B group vitamins: Secondary | ICD-10-CM | POA: Diagnosis not present

## 2023-10-10 DIAGNOSIS — M47816 Spondylosis without myelopathy or radiculopathy, lumbar region: Secondary | ICD-10-CM | POA: Diagnosis not present

## 2023-10-11 ENCOUNTER — Telehealth: Payer: Self-pay | Admitting: Internal Medicine

## 2023-10-11 MED ORDER — OMEPRAZOLE 40 MG PO CPDR: DELAYED_RELEASE_CAPSULE | ORAL | 1 refills | Status: DC

## 2023-10-11 NOTE — Telephone Encounter (Signed)
 Inbound call from patient stating that she wanted to make her yearly appointment with Dr. Terri Piedra for May. I advised patient that he was currently booked up and we were waiting for his June schedule to be released. Patient stated she would call back sometime in March to see about scheduling in June. Patient wanted me to send a message to see if there was anyway she could get a refill for Prilosec in the meantime while she is waiting to schedule an appointment. Please advise.

## 2023-10-11 NOTE — Telephone Encounter (Signed)
 Prescription sent to patient's pharmacy. Left a detailed message informing patient that I sent a refill to her pharmacy and to call back to schedule an appt for June when it becomes available.

## 2023-10-14 DIAGNOSIS — E538 Deficiency of other specified B group vitamins: Secondary | ICD-10-CM | POA: Diagnosis not present

## 2023-10-20 DIAGNOSIS — Z01419 Encounter for gynecological examination (general) (routine) without abnormal findings: Secondary | ICD-10-CM | POA: Diagnosis not present

## 2023-10-20 DIAGNOSIS — Z13820 Encounter for screening for osteoporosis: Secondary | ICD-10-CM | POA: Diagnosis not present

## 2023-10-28 DIAGNOSIS — M170 Bilateral primary osteoarthritis of knee: Secondary | ICD-10-CM | POA: Diagnosis not present

## 2023-11-07 ENCOUNTER — Other Ambulatory Visit: Payer: Self-pay | Admitting: Cardiology

## 2023-11-28 ENCOUNTER — Telehealth: Payer: Self-pay | Admitting: Cardiology

## 2023-11-28 NOTE — Telephone Encounter (Signed)
   Pre-operative Risk Assessment    Patient Name: Hannah Vasquez  DOB: 1951/01/21 MRN: [id number]   Date of last office visit: 03/14/2023 Date of next office visit: 01/30/2024 /  Request for Surgical Clearance    Procedure:   left total knee arthroplasty  Date of Surgery:  Clearance 02/20/24                                Surgeon:  Dr. Liliane Rei Surgeon's Group or Practice Name:  [facility] Phone number:  [phone] Fax number:  [phone] Leron Rankins   Type of Clearance Requested:   - Medical  - Pharmacy:  Hold Aspirin need instruction   Type of Anesthesia:   choice   Additional requests/questions:    SignedLacey Pian   11/28/2023, 4:19 PM

## 2023-11-29 NOTE — Telephone Encounter (Signed)
 Patient has appointment with Dr. Renna Cary in June and would like to keep that for clearance

## 2023-11-29 NOTE — Telephone Encounter (Signed)
   Name: Hannah Vasquez  DOB: [birth year]  MRN: [id number]  Primary Cardiologist: Dorothye Gathers, MD   Preoperative team, please contact this patient and set up a phone call appointment for further preoperative risk assessment. Please obtain consent and complete medication review. Thank you for your help.  I confirm that guidance regarding antiplatelet and oral anticoagulation therapy has been completed and, if necessary, noted below.  Per office protocol, if patient is without any new symptoms or concerns at the time of their virtual visit, she may hold aspirin for 5-7 days prior to procedure. Please resume aspirin as soon as possible postprocedure, at the discretion of the surgeon.    I also confirmed the patient resides in the state of [place] . As per [facility] Medical Board telemedicine laws, the patient must reside in the state in which the provider is licensed.   Ava Boatman, NP 11/29/2023, 8:56 AM [facility]

## 2023-12-16 DIAGNOSIS — M47816 Spondylosis without myelopathy or radiculopathy, lumbar region: Secondary | ICD-10-CM | POA: Diagnosis not present

## 2023-12-26 DIAGNOSIS — Z1231 Encounter for screening mammogram for malignant neoplasm of breast: Secondary | ICD-10-CM | POA: Diagnosis not present

## 2023-12-30 DIAGNOSIS — M5416 Radiculopathy, lumbar region: Secondary | ICD-10-CM | POA: Diagnosis not present

## 2024-01-16 ENCOUNTER — Ambulatory Visit
Admission: RE | Admit: 2024-01-16 | Discharge: 2024-01-16 | Disposition: A | Discharge status: Home / Self Care | Payer: Self-pay | Source: Ambulatory Visit | Attending: Emergency Medicine | Admitting: Emergency Medicine

## 2024-01-16 VITALS — BP 119/73 | HR 80 | Temp 97.8°F | Resp 16

## 2024-01-16 DIAGNOSIS — J069 Acute upper respiratory infection, unspecified: Secondary | ICD-10-CM

## 2024-01-16 MED ORDER — BENZONATATE 100 MG PO CAPS: 100.0000 mg | ORAL_CAPSULE | Freq: Three times a day (TID) | ORAL | 0 refills | Status: DC

## 2024-01-16 MED ORDER — PREDNISONE 10 MG (21) PO TBPK: ORAL_TABLET | Freq: Every day | ORAL | 0 refills | Status: DC

## 2024-01-16 NOTE — Discharge Instructions (Signed)
 Your symptoms today are most likely being caused by a virus and should steadily improve in time it can take up to 7 to 10 days before you truly start to see a turnaround however things will get better  Begin prednisone  every morning with food to open and relax airway, also help with chest discomfort from coughing, avoid use of ibuprofen during treatment but may use Tylenol   You may use Tessalon  pill every 8 hours as needed to help calm your coughing, may take in addition to over-the-counter medicine     For cough: honey 1/2 to 1 teaspoon (you can dilute the honey in water or another fluid).  You can also use guaifenesin and dextromethorphan for cough. You can use a humidifier for chest congestion and cough.  If you don't have a humidifier, you can sit in the bathroom with the hot shower running.      For sore throat: try warm salt water gargles, cepacol lozenges, throat spray, warm tea or water with lemon/honey, popsicles or ice, or OTC cold relief medicine for throat discomfort.   For congestion: take a daily anti-histamine like Zyrtec, Claritin , and a oral decongestant, such as pseudoephedrine.  You can also use Flonase  1-2 sprays in each nostril daily.   It is important to stay hydrated: drink plenty of fluids (water, gatorade/powerade/pedialyte, juices, or teas) to keep your throat moisturized and help further relieve irritation/discomfort.

## 2024-01-16 NOTE — ED Triage Notes (Signed)
 Patient presents to UC for cough, chest congestion, and HA x 2 days. States her daughter was here last week with same symptoms. Treating symptoms with delsym. Req prednisone .

## 2024-01-16 NOTE — ED Provider Notes (Signed)
 Arlander Bellman    CSN: [id number] Arrival date & time: 01/16/24  0846      History   Chief Complaint Chief Complaint  Patient presents with   Cough    Chest pain, congestion, wet cough - Entered by patient    HPI Hannah Vasquez is a 73 y.o. female.   Patient presents for evaluation of nasal congestion, nonproductive cough, intermittent headaches, diarrhea beginning 2 days ago.  Cough causing centralized chest soreness.  Known sick contact who was evaluated in its negative for all viral testing.  Has attempted use of Delsym and Tylenol .  Decreased appetite but tolerating food and liquid.  Past Medical History:  Diagnosis Date   Advanced hepatic fibrosis    Allergy    Anxiety    CAD (coronary artery disease)    Cataract    Chronic systolic heart failure (HCC)    DDD (degenerative disc disease), thoracolumbar    Does use hearing aid    bilateral as of 10/05/2019   Elevated alkaline phosphatase level    Gallstones    Hemorrhoids    Hepatic steatosis    History of MI (myocardial infarction) 10/07/2017   Hypertension    Mitral valve prolapse    NASH (nonalcoholic steatohepatitis)    OSA (obstructive sleep apnea)    Osteoporosis    RBBB    Sarcoidosis    Status post dilation of esophageal narrowing     Patient Active Problem List   Diagnosis Date Noted   Left-sided face pain 11/18/2021   Dizziness, nonspecific 11/03/2021   Decreased grip strength of left hand 11/03/2021   Sarcoidosis 09/10/2021   Thyroid  nodule 09/10/2021   Urinary urgency 05/21/2021   Vitamin B12 deficiency 01/29/2021   Cough 07/29/2020   OSA (obstructive sleep apnea) 06/25/2020   Early satiety 04/29/2020   Upper abdominal pain 04/29/2020   Nausea without vomiting 04/29/2020   Prediabetes 03/25/2020   Nocturia 03/25/2020   Insomnia 03/25/2020   Reactive airway disease 12/12/2019   Neuropathy 12/12/2019   Chronic systolic heart failure (HCC) 07/11/2019   Elevated serum alkaline  phosphatase level 07/10/2019   Leukocytosis 07/10/2019   Bilateral hand pain 04/09/2019   Bilateral hearing loss 04/09/2019   Trigger finger, left middle finger 10/30/2018   Lumbar back pain with radiculopathy affecting lower extremity 05/22/2018   Osteopenia 05/15/2018   Lumbar radiculopathy 04/27/2018   GERD (gastroesophageal reflux disease) 04/10/2018   Shortness of breath 08/21/2017   DDD (degenerative disc disease), thoracolumbar 04/28/2017   Back pain 04/21/2017   Hypokalemia 01/06/2017   History of non-ST elevation myocardial infarction (NSTEMI) 01/01/2017   Fatigue 11/27/2014   RBBB 12/19/2013   Mitral valve prolapse    HYPERTENSION, BENIGN 08/27/2010   Anxiety state 06/27/2009   Coronary atherosclerosis 06/27/2009   Mitral valve disorder 06/27/2009   Hemorrhoids 06/27/2009   Allergic rhinitis 06/27/2009   Disorder of bone and cartilage 11/11/2006    Past Surgical History:  Procedure Laterality Date   BASAL CELL CARCINOMA EXCISION  02/05/2016   Dr. Jerilynn Montenegro, [facility]   BLADDER SURGERY  11/15/1995   bladder neck suspension, urinary incontinence   CARDIAC CATHETERIZATION  01/03/2017 DES LAD   CATARACT EXTRACTION Left 04/30/2016   Dr. Demetrios Finders, [facility]   CORONARY STENT INTERVENTION N/A 01/03/2017   Procedure: Coronary Stent Intervention;  Surgeon: Sammy Crisp, MD;  Location: [facility] INVASIVE CV LAB;  Service: Cardiovascular;  Laterality: N/A;   CORONARY ULTRASOUND/IVUS N/A 01/03/2017   Procedure: Intravascular Ultrasound/IVUS;  Surgeon:  End, Veryl Gottron, MD;  Location: [facility] INVASIVE CV LAB;  Service: Cardiovascular;  Laterality: N/A;   DENTAL SURGERY  06/20/2016   Tooth Implant; Dr. Ramiro Burly   KNEE SURGERY  2003   torn meniscus   LEFT HEART CATH AND CORONARY ANGIOGRAPHY N/A 01/03/2017   Procedure: Left Heart Cath and Coronary Angiography;  Surgeon: Sammy Crisp, MD;  Location: [facility] INVASIVE CV LAB;  Service: Cardiovascular;  Laterality: N/A;   MOHS SURGERY  Right 2017   right side of face   ROTATOR CUFF REPAIR     SHOULDER SURGERY Right 2005    OB History     Gravida  2   Para  2   Term  2   Preterm      AB      Living  2      SAB      IAB      Ectopic      Multiple      Live Births  2            Home Medications    Prior to Admission medications   Medication Sig Start Date End Date Taking? Authorizing Provider  benzonatate  (TESSALON ) 100 MG capsule Take 1 capsule (100 mg total) by mouth every 8 (eight) hours. 01/16/24  Yes Citlally Captain R, NP  predniSONE  (STERAPRED UNI-PAK 21 TAB) 10 MG (21) TBPK tablet Take by mouth daily. Take 6 tabs by mouth daily  for 1 days, then 5 tabs for 1 days, then 4 tabs for 1 days, then 3 tabs for 1 days, 2 tabs for 1 days, then 1 tab by mouth daily for 1 days 01/16/24  Yes Mariajose Mow R, NP  acetaminophen  (TYLENOL ) 500 MG tablet Take 500 mg by mouth every 6 (six) hours as needed for moderate pain.    [provider]  albuterol  (VENTOLIN  HFA) 108 (90 Base) MCG/ACT inhaler Inhale 1 puff into the lungs every 6 (six) hours as needed for wheezing or shortness of breath. 12/12/19   Reford Canterbury, MD  amLODipine  (NORVASC ) 5 MG tablet TAKE ONE TABLET BY MOUTH ONCE A DAY 06/16/23   Hugh Madura, MD  aspirin  EC 81 MG tablet Take 81 mg by mouth every morning.     [provider]  atorvastatin  (LIPITOR ) 80 MG tablet TAKE ONE TABLET BY MOUTH DAILY AT SIX IN THE EVENING 05/09/23   Hugh Madura, MD  Calcium -Magnesium-Vitamin D  (CALCIUM  1200+D3 PO) Take 1,200 mg by mouth daily.    [provider]  fluticasone  (FLONASE ) 50 MCG/ACT nasal spray Place 1 spray into both nostrils daily as needed for allergies.    [provider]  furosemide  (LASIX ) 20 MG tablet Take 1 tablet (20 mg total) by mouth daily as needed. 01/01/19   Hugh Madura, MD  gabapentin  (NEURONTIN ) 300 MG capsule Take 1 capsule (300 mg total) by mouth 3 (three) times daily. 05/21/21   Reford Canterbury, MD   isosorbide  mononitrate (IMDUR ) 30 MG 24 hr tablet TAKE ONE TABLET BY MOUTH DAILY 07/27/23   Hugh Madura, MD  loratadine  (CLARITIN ) 10 MG tablet Take 1 tablet (10 mg total) by mouth every morning. 11/08/13   Aron, Talia M, MD  losartan  (COZAAR ) 100 MG tablet TAKE ONE TABLET BY MOUTH ONCE A DAY 03/22/23   Hugh Madura, MD  meloxicam  (MOBIC ) 7.5 MG tablet Take 1 tablet (7.5 mg total) by mouth daily. 11/18/21   Reford Canterbury, MD  metoprolol  succinate (TOPROL -XL) 25 MG 24  hr tablet TAKE ONE TABLET BY MOUTH ONCE A DAY 05/09/23   Hugh Madura, MD  Multiple Vitamin (MULTIVITAMIN) tablet Take 1 tablet by mouth every morning.     [provider]  MYRBETRIQ 50 MG TB24 tablet Take 50 mg by mouth daily. 09/28/21   [provider]  nitroGLYCERIN  (NITROSTAT ) 0.4 MG SL tablet DISSOLVE ONE TABLET UNDER TONGUE AS NEEDED FOR CHEST PAIN. MAY REPEAT FIVE MINUTES APART THREE TIMES IF NEEDED 04/27/23   Hugh Madura, MD  Omega-3 Fatty Acids (FISH OIL ) 1200 MG CAPS Take 1,200 mg by mouth every morning.    [provider]  omeprazole  (PRILOSEC) 40 MG capsule TAKE ONE CAPSULE (40 MG TOTAL) BY MOUTH DAILY. PER MD: PATIENT NEEDS AN APPOINTMENT FOR FUTURE REFILLS 10/11/23   Pyrtle, Amber Bail, MD  ondansetron  (ZOFRAN -ODT) 4 MG disintegrating tablet Take 1 tablet (4 mg total) by mouth every 8 (eight) hours as needed for nausea or vomiting. 04/20/22   Reford Canterbury, MD  potassium chloride  SA (KLOR-CON  M) 20 MEQ tablet TAKE TWO TABLETS BY MOUTH TWICE DAILY 11/07/23   Hugh Madura, MD  spironolactone  (ALDACTONE ) 25 MG tablet Take 1 tablet (25 mg total) by mouth daily. 09/21/23   Hugh Madura, MD  tiZANidine  (ZANAFLEX ) 4 MG tablet Take by mouth. 03/01/22   [provider]  traMADol  (ULTRAM ) 50 MG tablet Take 1 tablet (50 mg total) by mouth every 6 (six) hours as needed for severe pain. 06/09/22   Webb, Padonda B, FNP  triamcinolone  cream (KENALOG ) 0.1 % Apply 1 Application topically 2 (two) times daily as  needed. 03/25/23   Buena Carmine, NP  vitamin E 180 MG (400 UNITS) capsule Take 800 Units by mouth in the morning and at bedtime.    [provider]    Family History Family History  Problem Relation Age of Onset   Mitral valve prolapse Mother    Hypertension Mother    Hypertension Father    Brain cancer Father    Colon cancer Neg Hx    Stomach cancer Neg Hx    Esophageal cancer Neg Hx    Pancreatic cancer Neg Hx     Social History Social History   Tobacco Use   Smoking status: Never   Smokeless tobacco: Never  Vaping Use   Vaping status: Never Used  Substance Use Topics   Alcohol use: No    Alcohol/week: 0.0 standard drinks of alcohol   Drug use: No     Allergies   Promethazine -dm, Bactroban  [mupirocin  calcium ], Doxycycline , Codeine, Mupirocin , Valtrex  [valacyclovir  hcl], and Penicillins   Review of Systems Review of Systems   Physical Exam Triage Vital Signs ED Triage Vitals  Encounter Vitals Group     BP 01/16/24 0913 119/73     Systolic BP Percentile --      Diastolic BP Percentile --      Pulse Rate 01/16/24 0913 80     Resp 01/16/24 0913 16     Temp 01/16/24 0913 97.8 F (36.6 C)     Temp Source 01/16/24 0913 Temporal     SpO2 01/16/24 0913 96 %     Weight --      Height --      Head Circumference --      Peak Flow --      Pain Score 01/16/24 0912 0     Pain Loc --      Pain Education --      Exclude  from Growth Chart --    No data found.  Updated Vital Signs BP 119/73 (BP Location: Left Arm)   Pulse 80   Temp 97.8 F (36.6 C) (Temporal)   Resp 16   LMP  (LMP Unknown)   SpO2 96%   Visual Acuity Right Eye Distance:   Left Eye Distance:   Bilateral Distance:    Right Eye Near:   Left Eye Near:    Bilateral Near:     Physical Exam Constitutional:      Appearance: Normal appearance.  HENT:     Right Ear: Tympanic membrane, ear canal and external ear normal.     Left Ear: Tympanic membrane, ear canal and external ear  normal.     Nose: Congestion present.     Mouth/Throat:     Mouth: Mucous membranes are moist.     Pharynx: Oropharynx is clear. No oropharyngeal exudate or posterior oropharyngeal erythema.  Eyes:     Extraocular Movements: Extraocular movements intact.  Cardiovascular:     Rate and Rhythm: Normal rate and regular rhythm.     Pulses: Normal pulses.     Heart sounds: Normal heart sounds.  Pulmonary:     Effort: Pulmonary effort is normal.     Breath sounds: Normal breath sounds.  Neurological:     Mental Status: She is alert and oriented to person, place, and time. Mental status is at baseline.      UC Treatments / Results  Labs (all labs ordered are listed, but only abnormal results are displayed) Labs Reviewed - No data to display  EKG   Radiology No results found.  Procedures Procedures (including critical care time)  Medications Ordered in UC Medications - No data to display  Initial Impression / Assessment and Plan / UC Course  I have reviewed the triage vital signs and the nursing notes.  Pertinent labs & imaging results that were available during my care of the patient were reviewed by me and considered in my medical decision making (see chart for details).  Viral URI with cough  Patient is in no signs of distress nor toxic appearing.  Vital signs are stable.  Low suspicion for pneumonia, pneumothorax or bronchitis and therefore will defer imaging. Prescribed prednisone  and Tessalon .May use additional over-the-counter medications as needed for supportive care.  May follow-up with urgent care as needed if symptoms persist or worsen.  Final Clinical Impressions(s) / UC Diagnoses   Final diagnoses:  Viral URI with cough   Discharge Instructions      Your symptoms today are most likely being caused by a virus and should steadily improve in time it can take up to 7 to 10 days before you truly start to see a turnaround however things will get better  Begin  prednisone  every morning with food to open and relax airway, also help with chest discomfort from coughing, avoid use of ibuprofen during treatment but may use Tylenol   You may use Tessalon  pill every 8 hours as needed to help calm your coughing, may take in addition to over-the-counter medicine     For cough: honey 1/2 to 1 teaspoon (you can dilute the honey in water or another fluid).  You can also use guaifenesin and dextromethorphan for cough. You can use a humidifier for chest congestion and cough.  If you don't have a humidifier, you can sit in the bathroom with the hot shower running.      For sore throat: try warm salt water gargles,  cepacol lozenges, throat spray, warm tea or water with lemon/honey, popsicles or ice, or OTC cold relief medicine for throat discomfort.   For congestion: take a daily anti-histamine like Zyrtec, Claritin , and a oral decongestant, such as pseudoephedrine.  You can also use Flonase  1-2 sprays in each nostril daily.   It is important to stay hydrated: drink plenty of fluids (water, gatorade/powerade/pedialyte, juices, or teas) to keep your throat moisturized and help further relieve irritation/discomfort.   ED Prescriptions     Medication Sig Dispense Auth. Provider   predniSONE  (STERAPRED UNI-PAK 21 TAB) 10 MG (21) TBPK tablet Take by mouth daily. Take 6 tabs by mouth daily  for 1 days, then 5 tabs for 1 days, then 4 tabs for 1 days, then 3 tabs for 1 days, 2 tabs for 1 days, then 1 tab by mouth daily for 1 days 21 tablet Katrell Milhorn R, NP   benzonatate  (TESSALON ) 100 MG capsule Take 1 capsule (100 mg total) by mouth every 8 (eight) hours. 21 capsule Baylin Gamblin, Maybelle Spatz, NP      PDMP not reviewed this encounter.   Reena Canning, [place] 01/16/24 [phone]

## 2024-01-17 ENCOUNTER — Other Ambulatory Visit (INDEPENDENT_AMBULATORY_CARE_PROVIDER_SITE_OTHER)

## 2024-01-17 ENCOUNTER — Ambulatory Visit: Admitting: Internal Medicine

## 2024-01-17 ENCOUNTER — Encounter: Payer: Self-pay | Admitting: Internal Medicine

## 2024-01-17 VITALS — BP 130/60 | HR 60 | Ht 61.0 in | Wt 160.1 lb

## 2024-01-17 DIAGNOSIS — K7581 Nonalcoholic steatohepatitis (NASH): Secondary | ICD-10-CM

## 2024-01-17 DIAGNOSIS — D8689 Sarcoidosis of other sites: Secondary | ICD-10-CM

## 2024-01-17 DIAGNOSIS — R1013 Epigastric pain: Secondary | ICD-10-CM | POA: Diagnosis not present

## 2024-01-17 LAB — COMPREHENSIVE METABOLIC PANEL WITH GFR
ALT: 22 U/L (ref 0–35)
AST: 24 U/L (ref 0–37)
Albumin: 4.3 g/dL (ref 3.5–5.2)
Alkaline Phosphatase: 98 U/L (ref 39–117)
BUN: 19 mg/dL (ref 6–23)
CO2: 24 meq/L (ref 19–32)
Calcium: 10.1 mg/dL (ref 8.4–10.5)
Chloride: 104 meq/L (ref 96–112)
Creatinine, Ser: 1.24 mg/dL — ABNORMAL HIGH (ref 0.40–1.20)
GFR: 42.64 mL/min — ABNORMAL LOW (ref >60.00)
Glucose, Bld: 133 mg/dL — ABNORMAL HIGH (ref 70–99)
Potassium: 4.3 meq/L (ref 3.5–5.1)
Sodium: 137 meq/L (ref 135–145)
Total Bilirubin: 0.4 mg/dL (ref 0.2–1.2)
Total Protein: 7.3 g/dL (ref 6.0–8.3)

## 2024-01-17 LAB — CBC WITH DIFFERENTIAL/PLATELET
Basophils Absolute: 0 10*3/uL (ref 0.0–0.1)
Basophils Relative: 0.2 % (ref 0.0–3.0)
Eosinophils Absolute: 0 10*3/uL (ref 0.0–0.7)
Eosinophils Relative: 0.2 % (ref 0.0–5.0)
HCT: 38.2 % (ref 36.0–46.0)
Hemoglobin: 12.8 g/dL (ref 12.0–15.0)
Lymphocytes Relative: 17.6 % (ref 12.0–46.0)
Lymphs Abs: 2.3 10*3/uL (ref 0.7–4.0)
MCHC: 33.4 g/dL (ref 30.0–36.0)
MCV: 87.4 fl (ref 78.0–100.0)
Monocytes Absolute: 0.4 10*3/uL (ref 0.1–1.0)
Monocytes Relative: 3.4 % (ref 3.0–12.0)
Neutro Abs: 10.3 10*3/uL — ABNORMAL HIGH (ref 1.4–7.7)
Neutrophils Relative %: 78.6 % — ABNORMAL HIGH (ref 43.0–77.0)
Platelets: 276 10*3/uL (ref 150.0–400.0)
RBC: 4.37 Mil/uL (ref 3.87–5.11)
RDW: 14.1 % (ref 11.5–15.5)
WBC: 13.1 10*3/uL — ABNORMAL HIGH (ref 4.0–10.5)

## 2024-01-17 LAB — VITAMIN B12: Vitamin B-12: >1500 pg/mL — ABNORMAL HIGH (ref 211–911)

## 2024-01-17 LAB — PROTIME-INR
INR: 1 ratio (ref 0.8–1.0)
Prothrombin Time: 11 s (ref 9.6–13.1)

## 2024-01-17 MED ORDER — OMEPRAZOLE 40 MG PO CPDR: DELAYED_RELEASE_CAPSULE | ORAL | 11 refills | Status: AC

## 2024-01-17 NOTE — Progress Notes (Signed)
 Subjective:    Patient ID: Hannah Vasquez, female    DOB: 1950/11/08, 73 y.o.   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  She is here today with her daughter and was last seen on 12/30/22.  She has a history of liver enzymes elevation, previously moderate as confirmed by biopsy, which has improved over time. Her liver enzymes have been stable since December. No abdominal pain, swelling, itching, or dark urine. She has not experienced any dyspeptic symptoms as long as she continues taking omeprazole . No nausea, vomiting, trouble swallowing, or changes in bowel habits. She occasionally experiences nausea, but it is not consistent and last occurred about a month ago.  She continues to take omeprazole  for acid peptic symptoms, which effectively manages her condition. She also takes vitamin E, 800 IU daily, which she has tolerated well. She previously tried ursodiol but could not tolerate it.  She is scheduled for a knee replacement in July, starting with the left knee, which is the more symptomatic one. She reports that both knees are 'bone in bone' and cause discomfort.  She has an upcoming appointment with her cardiologist on the 16th of this month. She has not needed nitroglycerin  for chest pain recently.  Review of Systems As per HPI, otherwise negative  Current Medications, Allergies, Past Medical History, Past Surgical History, Family History and Social History were reviewed in [employer] record.    Objective:   Physical Exam BP 130/60 (BP Location: Left Arm, Patient Position: Sitting, Cuff Size: Normal)   Pulse 60   Ht 5\' 1"  (1.549 m) Comment: height measured without shoes  Wt 160 lb 1 oz (72.6 kg)   LMP  (LMP Unknown)   BMI 30.24 kg/m  Gen: awake, alert, NAD HEENT: anicteric  Abd: soft,  NT/ND, +BS throughout Ext: no c/c/e Neuro: nonfocal  CMP     Component Value Date/Time   NA 142 12/30/2022 1051   NA 144 05/04/2022 1132   K 4.4 12/30/2022 1051   CL 104 12/30/2022 1051   CO2 28 12/30/2022 1051   GLUCOSE 113 (H) 12/30/2022 1051   BUN 13 12/30/2022 1051   BUN 14 05/04/2022 1132   CREATININE 0.97 12/30/2022 1051   CALCIUM  10.4 12/30/2022 1051   PROT 7.8 12/30/2022 1051   PROT 6.7 07/02/2019 1104   ALBUMIN 4.5 12/30/2022 1051   ALBUMIN 4.2 07/02/2019 1104   AST 20 12/30/2022 1051   ALT 15 12/30/2022 1051   ALKPHOS 134 (H) 12/30/2022 1051   BILITOT 0.6 12/30/2022 1051   BILITOT 0.8 07/02/2019 1104   GFR 57.67 (L) 12/30/2022 1051   EGFR 54 (L) 05/04/2022 1132   GFRNONAA >60 11/16/2021 1049   Lab Results  Component Value Date   INR 1.0 12/30/2022   INR 1.0 03/31/2022   INR 1.0 05/27/2021      Latest Ref Rng & Units 12/30/2022   10:51 AM 03/31/2022    9:34 AM 11/16/2021   10:49 AM  CBC  WBC 4.0 - 10.5 K/uL 13.5  10.0  11.4   Hemoglobin 12.0 - 15.0 g/dL 65.7  84.6  96.2   Hematocrit 36.0 - 46.0 % 41.2  41.6  40.0   Platelets 150.0 - 400.0 K/uL 347.0  302.0  324    AMA neg      Assessment & Plan:  73 year old female with a history of 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 Hepatic sarcoid/?PBC --phosphatase has been slightly elevated but clinically she looks well.  She has been able to continue Vitamin E but was unable to tolerate ursodiol.  She has gained about 8 pounds and we discussed trying to get back to her previous weight.  Certainly no signs of advanced or decompensated portal hypertension -- Repeat CBC CMP and INR, check AMA again -- Continue with Mediterranean type diet and vitamin E 800 IU daily -- Previously seen by [facility] but can follow-up with them as needed  2.  Sarcoidosis --hepatic  without pulmonary involvement.  Currently well compensated and not needing prednisone   3.  Dyspepsia with epigastric pain --responsive to PPI but returns without it -- Continue omeprazole  40 mg daily  4.  CRC screening --normal colonoscopy in May 2018, consider repeat in May 2028  Annual follow-up, sooner if needed  20 minutes total spent today including patient facing time, coordination of care, reviewing medical history/procedures/pertinent radiology studies, and documentation of the encounter.

## 2024-01-17 NOTE — Patient Instructions (Addendum)
 Your provider has requested that you go to the basement level for lab work before leaving today. Press "B" on the elevator. The lab is located at the first door on the left as you exit the elevator.  We have sent the following medications to your pharmacy for you to pick up at your convenience: omeprazole  40 mg daily.   Continue Vitamin E 800 units daily.   _______________________________________________________  If your blood pressure at your visit was 140/90 or greater, please contact your primary care physician to follow up on this.  _______________________________________________________  If you are age 81 or older, your body mass index should be between 23-30. Your Body mass index is 30.24 kg/m. If this is out of the aforementioned range listed, please consider follow up with your Primary Care Provider.  If you are age 42 or younger, your body mass index should be between 19-25. Your Body mass index is 30.24 kg/m. If this is out of the aformentioned range listed, please consider follow up with your Primary Care Provider.   ________________________________________________________  The [facility] providers would like to encourage you to use MYCHART to communicate with providers for non-urgent requests or questions.  Due to long hold times on the telephone, sending your provider a message by [facility] may be a faster and more efficient way to get a response.  Please allow 48 business hours for a response.  Please remember that this is for non-urgent requests.  _______________________________________________________

## 2024-01-18 ENCOUNTER — Ambulatory Visit: Payer: Self-pay | Admitting: Internal Medicine

## 2024-01-19 DIAGNOSIS — M5416 Radiculopathy, lumbar region: Secondary | ICD-10-CM | POA: Diagnosis not present

## 2024-01-23 DIAGNOSIS — J4520 Mild intermittent asthma, uncomplicated: Secondary | ICD-10-CM | POA: Diagnosis not present

## 2024-01-23 DIAGNOSIS — G8929 Other chronic pain: Secondary | ICD-10-CM | POA: Diagnosis not present

## 2024-01-23 DIAGNOSIS — M25562 Pain in left knee: Secondary | ICD-10-CM | POA: Diagnosis not present

## 2024-01-23 DIAGNOSIS — R7301 Impaired fasting glucose: Secondary | ICD-10-CM | POA: Diagnosis not present

## 2024-01-23 DIAGNOSIS — Z0000 Encounter for general adult medical examination without abnormal findings: Secondary | ICD-10-CM | POA: Diagnosis not present

## 2024-01-23 DIAGNOSIS — M1712 Unilateral primary osteoarthritis, left knee: Secondary | ICD-10-CM | POA: Diagnosis not present

## 2024-01-23 DIAGNOSIS — I10 Essential (primary) hypertension: Secondary | ICD-10-CM | POA: Diagnosis not present

## 2024-01-23 DIAGNOSIS — K7581 Nonalcoholic steatohepatitis (NASH): Secondary | ICD-10-CM | POA: Diagnosis not present

## 2024-01-23 DIAGNOSIS — Z01818 Encounter for other preprocedural examination: Secondary | ICD-10-CM | POA: Diagnosis not present

## 2024-01-23 DIAGNOSIS — I5022 Chronic systolic (congestive) heart failure: Secondary | ICD-10-CM | POA: Diagnosis not present

## 2024-01-23 NOTE — H&P (Cosign Needed Addendum)
 TOTAL KNEE ADMISSION H&P  Patient is being admitted for left total knee arthroplasty.  Subjective:  Chief Complaint: Left knee pain.  HPI: Hannah Vasquez, 73 y.o. female has a history of pain and functional disability in the left knee due to arthritis and has failed non-surgical conservative treatments for greater than 12 weeks to include NSAID's and/or analgesics, corticosteriod injections, and activity modification. Onset of symptoms was gradual, starting several years ago with gradually worsening course since that time. The patient noted no past surgery on the left knee.  Patient currently rates pain in the left knee at 8 out of 10 with activity. Patient has night pain, worsening of pain with activity and weight bearing, and pain that interferes with activities of daily living. Patient has evidence of bone-on-bone arthritis in the medial and patellofemoral compartments of both knees with osteophyte formation by imaging studies. There is no active infection.  Patient Active Problem List   Diagnosis Date Noted   Left-sided face pain 11/18/2021   Dizziness, nonspecific 11/03/2021   Decreased grip strength of left hand 11/03/2021   Sarcoidosis 09/10/2021   Thyroid  nodule 09/10/2021   Urinary urgency 05/21/2021   Vitamin B12 deficiency 01/29/2021   Cough 07/29/2020   OSA (obstructive sleep apnea) 06/25/2020   Early satiety 04/29/2020   Upper abdominal pain 04/29/2020   Nausea without vomiting 04/29/2020   Prediabetes 03/25/2020   Nocturia 03/25/2020   Insomnia 03/25/2020   Reactive airway disease 12/12/2019   Neuropathy 12/12/2019   Chronic systolic heart failure (HCC) 07/11/2019   Elevated serum alkaline phosphatase level 07/10/2019   Leukocytosis 07/10/2019   Bilateral hand pain 04/09/2019   Bilateral hearing loss 04/09/2019   Trigger finger, left middle finger 10/30/2018   Lumbar back pain with radiculopathy affecting lower extremity 05/22/2018   Osteopenia 05/15/2018   Lumbar  radiculopathy 04/27/2018   GERD (gastroesophageal reflux disease) 04/10/2018   Shortness of breath 08/21/2017   DDD (degenerative disc disease), thoracolumbar 04/28/2017   Back pain 04/21/2017   Hypokalemia 01/06/2017   History of non-ST elevation myocardial infarction (NSTEMI) 01/01/2017   Fatigue 11/27/2014   RBBB 12/19/2013   Mitral valve prolapse    HYPERTENSION, BENIGN 08/27/2010   Anxiety state 06/27/2009   Coronary atherosclerosis 06/27/2009   Mitral valve disorder 06/27/2009   Hemorrhoids 06/27/2009   Allergic rhinitis 06/27/2009   Disorder of bone and cartilage 11/11/2006    Past Medical History:  Diagnosis Date   Advanced hepatic fibrosis    Allergy    Anxiety    CAD (coronary artery disease)    Cataract    Chronic systolic heart failure (HCC)    DDD (degenerative disc disease), thoracolumbar    Does use hearing aid    bilateral as of 10/05/2019   Elevated alkaline phosphatase level    Gallstones    Hemorrhoids    Hepatic steatosis    History of MI (myocardial infarction) 10/07/2017   Hypertension    Mitral valve prolapse    NASH (nonalcoholic steatohepatitis)    OSA (obstructive sleep apnea)    Osteoporosis    RBBB    Sarcoidosis    Status post dilation of esophageal narrowing     Past Surgical History:  Procedure Laterality Date   BASAL CELL CARCINOMA EXCISION  02/05/2016   Dr. Jadine, [facility]   BLADDER SURGERY  11/15/1995   bladder neck suspension, urinary incontinence   CARDIAC CATHETERIZATION  01/03/2017 DES LAD   CATARACT EXTRACTION Left 04/30/2016   Dr. Milan, [facility]  CORONARY STENT INTERVENTION N/A 01/03/2017   Procedure: Coronary Stent Intervention;  Surgeon: Mady Bruckner, MD;  Location: [facility] INVASIVE CV LAB;  Service: Cardiovascular;  Laterality: N/A;   CORONARY ULTRASOUND/IVUS N/A 01/03/2017   Procedure: Intravascular Ultrasound/IVUS;  Surgeon: Mady Bruckner, MD;  Location: [facility] INVASIVE CV LAB;  Service:  Cardiovascular;  Laterality: N/A;   DENTAL SURGERY  06/20/2016   Tooth Implant; Dr. Ann   KNEE SURGERY  2003   torn meniscus   LEFT HEART CATH AND CORONARY ANGIOGRAPHY N/A 01/03/2017   Procedure: Left Heart Cath and Coronary Angiography;  Surgeon: Mady Bruckner, MD;  Location: [facility] INVASIVE CV LAB;  Service: Cardiovascular;  Laterality: N/A;   MOHS SURGERY Right 2017   right side of face   ROTATOR CUFF REPAIR     SHOULDER SURGERY Right 2005    Prior to Admission medications   Medication Sig Start Date End Date Taking? Authorizing Provider  acetaminophen  (TYLENOL ) 500 MG tablet Take 500 mg by mouth every 6 (six) hours as needed for moderate pain.    [provider]  albuterol  (VENTOLIN  HFA) 108 (90 Base) MCG/ACT inhaler Inhale 1 puff into the lungs every 6 (six) hours as needed for wheezing or shortness of breath. 12/12/19   Velma Raisin, MD  amLODipine  (NORVASC ) 5 MG tablet TAKE ONE TABLET BY MOUTH ONCE A DAY 06/16/23   Jeffrie Oneil BROCKS, MD  aspirin  EC 81 MG tablet Take 81 mg by mouth every morning.     [provider]  atorvastatin  (LIPITOR ) 80 MG tablet TAKE ONE TABLET BY MOUTH DAILY AT SIX IN THE EVENING 05/09/23   Jeffrie Oneil BROCKS, MD  benzonatate  (TESSALON ) 100 MG capsule Take 1 capsule (100 mg total) by mouth every 8 (eight) hours. 01/16/24   Teresa Shelba SAUNDERS, NP  Calcium -Magnesium-Vitamin D  (CALCIUM  1200+D3 PO) Take 1,200 mg by mouth daily.    [provider]  Cyanocobalamin  (VITAMIN B12 PO) Take 1 tablet by mouth daily.    [provider]  fluticasone  (FLONASE ) 50 MCG/ACT nasal spray Place 1 spray into both nostrils daily as needed for allergies.    [provider]  furosemide  (LASIX ) 20 MG tablet Take 1 tablet (20 mg total) by mouth daily as needed. 01/01/19   Jeffrie Oneil BROCKS, MD  gabapentin  (NEURONTIN ) 300 MG capsule Take 1 capsule (300 mg total) by mouth 3 (three) times daily. 05/21/21   Velma Raisin, MD  isosorbide  mononitrate (IMDUR ) 30  MG 24 hr tablet TAKE ONE TABLET BY MOUTH DAILY 07/27/23   Jeffrie Oneil BROCKS, MD  loratadine  (CLARITIN ) 10 MG tablet Take 1 tablet (10 mg total) by mouth every morning. 11/08/13   Aron, Talia M, MD  losartan  (COZAAR ) 100 MG tablet TAKE ONE TABLET BY MOUTH ONCE A DAY 03/22/23   Jeffrie Oneil BROCKS, MD  meloxicam  (MOBIC ) 15 MG tablet Take 15 mg by mouth daily. 12/23/23   [provider]  metoprolol  succinate (TOPROL -XL) 25 MG 24 hr tablet TAKE ONE TABLET BY MOUTH ONCE A DAY 05/09/23   Jeffrie Oneil BROCKS, MD  Multiple Vitamin (MULTIVITAMIN) tablet Take 1 tablet by mouth every morning.     [provider]  MYRBETRIQ 50 MG TB24 tablet Take 50 mg by mouth daily. 09/28/21   [provider]  nitroGLYCERIN  (NITROSTAT ) 0.4 MG SL tablet DISSOLVE ONE TABLET UNDER TONGUE AS NEEDED FOR CHEST PAIN. MAY REPEAT FIVE MINUTES APART THREE TIMES IF NEEDED 04/27/23   Jeffrie Oneil BROCKS, MD  Omega-3 Fatty Acids (FISH OIL ) 1200  MG CAPS Take 1,200 mg by mouth every morning.    [provider]  omeprazole  (PRILOSEC) 40 MG capsule TAKE ONE CAPSULE (40 MG TOTAL) BY MOUTH DAILY. 01/17/24   Pyrtle, Gordy HERO, MD  ondansetron  (ZOFRAN -ODT) 4 MG disintegrating tablet Take 1 tablet (4 mg total) by mouth every 8 (eight) hours as needed for nausea or vomiting. 04/20/22   Velma Raisin, MD  potassium chloride  SA (KLOR-CON  M) 20 MEQ tablet TAKE TWO TABLETS BY MOUTH TWICE DAILY 11/07/23   Jeffrie Oneil BROCKS, MD  predniSONE  (STERAPRED UNI-PAK 21 TAB) 10 MG (21) TBPK tablet Take by mouth daily. Take 6 tabs by mouth daily  for 1 days, then 5 tabs for 1 days, then 4 tabs for 1 days, then 3 tabs for 1 days, 2 tabs for 1 days, then 1 tab by mouth daily for 1 days 01/16/24   Teresa Shelba SAUNDERS, NP  spironolactone  (ALDACTONE ) 25 MG tablet Take 1 tablet (25 mg total) by mouth daily. 09/21/23   Jeffrie Oneil BROCKS, MD  tiZANidine  (ZANAFLEX ) 4 MG tablet Take by mouth. 03/01/22   [provider]  traMADol  (ULTRAM ) 50 MG tablet Take 1 tablet (50 mg total)  by mouth every 6 (six) hours as needed for severe pain. 06/09/22   Webb, Padonda B, FNP  triamcinolone  cream (KENALOG ) 0.1 % Apply 1 Application topically 2 (two) times daily as needed. 03/25/23   Arloa Suzen RAMAN, NP  vitamin E 180 MG (400 UNITS) capsule Take 800 Units by mouth in the morning and at bedtime.    [provider]    Allergies  Allergen Reactions   Promethazine -Dm Nausea And Vomiting   Bactroban  [Mupirocin  Calcium ]     Facial redness and swelling   Doxycycline  Nausea And Vomiting   Codeine Nausea And Vomiting   Mupirocin  Swelling   Valtrex  [Valacyclovir  Hcl] Swelling   Penicillins Nausea And Vomiting and Rash    Social History   Socioeconomic History   Marital status: Divorced    Spouse name: Not on file   Number of children: 2   Years of education: high school   Highest education level: Not on file  Occupational History   Occupation: Medical office---front desk    Comment: Retired  Tobacco Use   Smoking status: Never   Smokeless tobacco: Never  Vaping Use   Vaping status: Never Used  Substance and Sexual Activity   Alcohol use: No    Alcohol/week: 0.0 standard drinks of alcohol   Drug use: No   Sexual activity: Not Currently    Birth control/protection: Post-menopausal  Other Topics Concern   Not on file  Social History Narrative   10/09/19   From: the area   Living: alone   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      Family: Civil engineer, contracting and Radio broadcast assistant (nearby) - keeps in good touch with them, 3 grandchildren      Enjoys: walking, outdoor activities, family time      Exercise: mostly just walking around the house, in cardio rehab at [facility]   Diet: not great currently      Safety   Seat belts: Yes    Guns: No   Safe in relationships: Yes    Right handed       Social Drivers of Health   Financial Resource Strain: Low Risk  (10/04/2023)   Received from [facility]   Overall Financial Resource Strain  (CARDIA)    Difficulty of Paying  Living Expenses: Not hard at all  Food Insecurity: No Food Insecurity (10/04/2023)   Received from [facility]   Hunger Vital Sign    Worried About Running Out of Food in the Last Year: Never true    Ran Out of Food in the Last Year: Never true  Transportation Needs: No Transportation Needs (10/04/2023)   Received from [facility] - Transportation    In the past 12 months, has lack of transportation kept you from medical appointments or from getting medications?: No    Lack of Transportation (Non-Medical): No  Physical Activity: Insufficiently Active (11/11/2020)   Exercise Vital Sign    Days of Exercise per Week: 2 days    Minutes of Exercise per Session: 50 min  Stress: No Stress Concern Present (11/11/2020)   [employer] of Occupational Health - Occupational Stress Questionnaire    Feeling of Stress : Not at all  Social Connections: Not on file  Intimate Partner Violence: Not At Risk (11/11/2020)   Humiliation, Afraid, Rape, and Kick questionnaire    Fear of Current or Ex-Partner: No    Emotionally Abused: No    Physically Abused: No    Sexually Abused: No    Tobacco Use: Low Risk  (01/23/2024)   Received from [facility]   Patient History    Smoking Tobacco Use: Never    Smokeless Tobacco Use: Never    Passive Exposure: Not on file   Social History   Substance and Sexual Activity  Alcohol Use No   Alcohol/week: 0.0 standard drinks of alcohol    Family History  Problem Relation Age of Onset   Mitral valve prolapse Mother    Hypertension Mother    Hypertension Father    Brain cancer Father    Colon cancer Neg Hx    Stomach cancer Neg Hx    Esophageal cancer Neg Hx    Pancreatic cancer Neg Hx     ROS  Objective:  Physical Exam: Well nourished and well developed.  General: Alert and oriented x3, cooperative and pleasant, no acute distress.  Head: normocephalic,  atraumatic, neck supple.  Eyes: EOMI. Abdomen: non-tender to palpation and soft, normoactive bowel sounds. Musculoskeletal: - Left knee: No effusion, range of motion is approximately 0-125 degrees with crepitus on range of motion, tender medially, no lateral tenderness or instability. - Right knee: Range of motion is approximately 5-120 degrees with crepitus on range of motion, tender medially, no lateral tenderness or instability. - Gait: Antalgic on the right. Calves soft and nontender. Motor function intact in LE. Strength 5/5 LE bilaterally. Neuro: Distal pulses 2+. Sensation to light touch intact in LE.  Vital signs in last 24 hours: BP: ()/()  Arterial Line BP: ()/()   Imaging Review Plain radiographs demonstrate severe degenerative joint disease of the left knee. The overall alignment is neutral. The bone quality appears to be adequate for age and reported activity level.  Assessment/Plan:  End stage arthritis, left knee   The patient history, physical examination, clinical judgment of the provider and imaging studies are consistent with end stage degenerative joint disease of the left knee and total knee arthroplasty is deemed medically necessary. The treatment options including medical management, injection therapy arthroscopy and arthroplasty were discussed at length. The risks and benefits of total knee arthroplasty were presented and reviewed. The risks due to aseptic loosening, infection, stiffness, patella tracking problems, thromboembolic complications and other imponderables were discussed. The patient  acknowledged the explanation, agreed to proceed with the plan and consent was signed. Patient is being admitted for inpatient treatment for surgery, pain control, PT, OT, prophylactic antibiotics, VTE prophylaxis, progressive ambulation and ADLs and discharge planning. The patient is planning to be discharged home.  Patient's anticipated LOS is less than 2 midnights, meeting  these requirements: - Lives within 1 hour of care - Has a competent adult at home to recover with post-op recover - NO history of  - Chronic pain requiring opiods  - Diabetes  - Heart failure  - Stroke  - DVT/VTE  - Cardiac arrhythmia  - Respiratory Failure/COPD  - Renal failure  - Anemia  - Advanced Liver disease  Therapy Plans: [facility] ([place]) Disposition: Home with Daughter Planned DVT Prophylaxis: Xarelto 10 mg QD DME Needed: RW PCP: Lynwood Null, MD (clearance received) Cardiologist: Oneil Parchment, MD (clearance received) TXA: IV Allergies: bactroban  (facial swelling), promethazine  (N/V), codeine (N/V), doxycycline  (N/V), penicillins (N/V), valacyclovir  (swelling) Anesthesia Concerns: BMI: 29.3 Last HgbA1c: not diabetic  Pharmacy: [facility] (deliver to room)  Other: -Per cardiology, do NOT stop aspirin  -Hx MI 2018 -Currently taking tramadol  50 mg BID; has tolerated oxycodone  in the past  - Patient was instructed on what medications to stop prior to surgery. - Follow-up visit in 2 weeks with Dr. Melodi - Begin physical therapy following surgery - Pre-operative lab work as pre-surgical testing - Prescriptions will be provided in hospital at time of discharge  R. Zelda Kobs, PA-C Orthopedic Surgery [facility] [place]

## 2024-01-24 LAB — MITOCHONDRIAL ANTIBODIES: Mitochondrial M2 Ab, IgG: <=20 U (ref <=20.0)

## 2024-01-30 ENCOUNTER — Encounter: Payer: Self-pay | Admitting: Cardiology

## 2024-01-30 ENCOUNTER — Ambulatory Visit: Attending: Cardiology | Admitting: Cardiology

## 2024-01-30 VITALS — BP 172/90 | HR 85 | Ht 61.0 in | Wt 160.0 lb

## 2024-01-30 DIAGNOSIS — Z01810 Encounter for preprocedural cardiovascular examination: Secondary | ICD-10-CM | POA: Diagnosis not present

## 2024-01-30 DIAGNOSIS — I2510 Atherosclerotic heart disease of native coronary artery without angina pectoris: Secondary | ICD-10-CM | POA: Diagnosis not present

## 2024-01-30 DIAGNOSIS — I10 Essential (primary) hypertension: Secondary | ICD-10-CM | POA: Diagnosis not present

## 2024-01-30 DIAGNOSIS — R7301 Impaired fasting glucose: Secondary | ICD-10-CM | POA: Diagnosis not present

## 2024-01-30 NOTE — Patient Instructions (Addendum)
 Medication Instructions:  The current medical regimen is effective;  continue present plan and medications.  *If you need a refill on your cardiac medications before your next appointment, please call your pharmacy*  Follow-Up: At [facility], you and your health needs are our priority.  As part of our continuing mission to provide you with exceptional heart care, our providers are all part of one team.  This team includes your primary Cardiologist (physician) and Advanced Practice Providers or APPs (Physician Assistants and Nurse Practitioners) who all work together to provide you with the care you need, when you need it.  Your next appointment:   1 year(s)  Provider:   Dorothye Gathers, MD    We recommend signing up for the patient portal called MyChart.  Sign up information is provided on this After Visit Summary.  MyChart is used to connect with patients for Virtual Visits (Telemedicine).  Patients are able to view lab/test results, encounter notes, upcoming appointments, etc.  Non-urgent messages can be sent to your provider as well.   To learn more about what you can do with MyChart, go to [email or web address].   OK for upcoming knee replacement.

## 2024-01-30 NOTE — Progress Notes (Signed)
 Cardiology Office Note:  .   Date:  01/30/2024  ID:  Janeth Medicus, DOB Apr 07, 1951, MRN [id number] PCP: Lyle San, MD  [facility] Providers Cardiologist:  Dorothye Gathers, MD Sleep Medicine:  Gaylyn Keas, MD     History of Present Illness: .   Hannah Vasquez is a 73 y.o. female Discussed the use of AI scribe software for clinical note transcription with the patient, who gave verbal consent to proceed.  History of Present Illness Hannah Vasquez is a 73 year old female with coronary artery disease and ischemic cardiomyopathy (resolved EF) who presents for preoperative evaluation for knee replacement surgery.  She is scheduled for knee replacement surgery on July 7th for her left knee, with the right knee to follow after recovery. She is here for a preoperative cardiovascular evaluation to ensure safety for the upcoming surgery. No chest pain, shortness of breath, or any unusual symptoms are currently present.  She has a history of coronary artery disease, having experienced a STEMI in May 2018, which was treated with a stent placement in the LAD. At that time, she had ischemic cardiomyopathy with an ejection fraction of 40%, which normalized after PCI. She underwent a stress test in 2021, which was normal and low risk, and an echocardiogram in 2021 showed normal pump function with trivial mitral regurgitation.  She also has left carotid artery stenosis, which was followed by vascular surgery with Dr. Reola Casino. A carotid Doppler in February 2025 showed left 40-59% stenosis and right 1-39% stenosis. A CT scan noted aortic atherosclerosis.  Her current medications include losartan  100 mg daily, spironolactone  25 mg daily, aspirin  81 mg daily, amlodipine  5 mg daily, atorvastatin  80 mg daily, and furosemide  20 mg as needed. She avoids NSAIDs like Aleve  due to kidney concerns and prefers Tylenol  for pain management.  She reports a recent blood pressure reading of 172, which she attributes to  anxiety about the new building and parking. At her primary care visit last week, her blood pressure was 102, and an EKG was performed, which was initially thought to be abnormal but was later deemed normal upon comparison with previous results. She monitors her blood pressure at home and reports it is generally good.     Studies Reviewed: Aaron Aas   EKG Interpretation Date/Time:  Monday January 30 2024 10:11:53 EDT Ventricular Rate:  85 PR Interval:  184 QRS Duration:  122 QT Interval:  376 QTC Calculation: 447 R Axis:   98  Text Interpretation: Sinus rhythm with occasional Premature ventricular complexes Right bundle branch block When compared with ECG of 14-Mar-2023 13:18, Premature ventricular complexes are now Present Confirmed by Dorothye Gathers ([id number]) on 01/30/2024 10:22:58 AM    Results LABS LDL cholesterol: 69 Creatinine: 1.24 (01/30/2024)  RADIOLOGY CT scan: Aortic atherosclerosis Carotid Doppler: Left 40-59% stenosis, right 1-39% stenosis (2025)  DIAGNOSTIC Stress test: Normal, low risk (2021) Echocardiogram 2021: Normal pump function, trivial mitral regurgitation (2021) EKG: Sinus rhythm with PVCs (01/30/2024) Risk Assessment/Calculations:           Physical Exam:   VS:  BP (!) 172/90   Pulse 85   Ht 5' 1 (1.549 m)   Wt 160 lb (72.6 kg)   LMP  (LMP Unknown)   SpO2 95%   BMI 30.23 kg/m    Wt Readings from Last 3 Encounters:  01/30/24 160 lb (72.6 kg)  01/17/24 160 lb 1 oz (72.6 kg)  09/21/23 160 lb (72.6 kg)    GEN: Well nourished,  well developed in no acute distress NECK: No JVD; No carotid bruits CARDIAC: RRR, no murmurs, no rubs, no gallops RESPIRATORY:  Clear to auscultation without rales, wheezing or rhonchi  ABDOMEN: Soft, non-tender, non-distended EXTREMITIES:  No edema; No deformity   ASSESSMENT AND PLAN: .    Assessment and Plan Assessment & Plan Coronary artery disease Coronary artery disease with a stent in the left anterior descending artery since  May 2018. No current chest pain or dyspnea. LDL cholesterol is well-controlled at 69 mg/dL, meeting the goal of less than 70 mg/dL. She continues on secondary prevention measures to avoid recurrence of cardiac events. - Continue atorvastatin  80 mg daily - Continue aspirin  81 mg daily, hold 5 days prior to knee surgery - Continue current antihypertensive regimen - Proceed with knee surgery as planned  Ischemic cardiomyopathy-resolved Ischemic cardiomyopathy with an ejection fraction of 40% that normalized post-PCI. No current heart failure symptoms. - Continue current cardiac medications - Proceed with knee surgery as planned  Hypertension Blood pressure was elevated at 172 mmHg systolic during the visit, likely due to situational factors such as anxiety. Home blood pressure readings have been good. Recent primary care visit showed a blood pressure of 102 mmHg systolic. - Recheck blood pressure before leaving the office - Continue current antihypertensive medications - Monitor blood pressure at home  Hyperlipidemia LDL cholesterol is well-controlled at 69 mg/dL, meeting the goal of less than 70 mg/dL. Atorvastatin  is effectively managing hyperlipidemia. - Continue atorvastatin  80 mg daily  Aortic atherosclerosis Aortic atherosclerosis noted on CT scan. -continue atorvastatin  80 mg  Left carotid artery stenosis Left carotid artery stenosis with 40-59% stenosis on recent carotid Doppler. No new symptoms reported. - Continue current medications to manage risk factors  Premature ventricular contractions (PVCs) EKG shows sinus rhythm with occasional PVCs, not considered problematic at this time.  Pre-op  knee - Proceed with surgery with low overall cardiac risk.  She may hold her aspirin  for 5 days prior to surgery.         1 yr  Signed, Dorothye Gathers, MD

## 2024-01-31 ENCOUNTER — Other Ambulatory Visit: Payer: Self-pay | Admitting: Cardiology

## 2024-01-31 DIAGNOSIS — I10 Essential (primary) hypertension: Secondary | ICD-10-CM | POA: Diagnosis not present

## 2024-01-31 DIAGNOSIS — R7301 Impaired fasting glucose: Secondary | ICD-10-CM | POA: Diagnosis not present

## 2024-02-01 ENCOUNTER — Telehealth: Payer: Self-pay | Admitting: Cardiology

## 2024-02-01 MED ORDER — ISOSORBIDE MONONITRATE ER 30 MG PO TB24: 30.0000 mg | ORAL_TABLET | Freq: Every day | ORAL | 3 refills | Status: DC

## 2024-02-01 NOTE — Telephone Encounter (Signed)
*  STAT* If patient is at the pharmacy, call can be transferred to refill team.   1. Which medications need to be refilled? (please list name of each medication and dose if known) isosorbide  mononitrate (IMDUR ) 30 MG 24 hr tablet    2. Would you like to learn more about the convenience, safety, & potential cost savings by using the [facility]?      3. Are you open to using the [facility] (Type [facility].  ).   4. Which pharmacy/location (including street and city if local pharmacy) is medication to be sent to? [facility] - [place], [place] - [address]    5. Do they need a 30 day or 90 day supply? 90 day

## 2024-02-01 NOTE — Telephone Encounter (Signed)
 Pt's medication was sent to pt's pharmacy as requested. Confirmation received.

## 2024-02-02 ENCOUNTER — Telehealth: Payer: Self-pay | Admitting: Cardiology

## 2024-02-02 DIAGNOSIS — R519 Headache, unspecified: Secondary | ICD-10-CM | POA: Diagnosis not present

## 2024-02-02 DIAGNOSIS — R110 Nausea: Secondary | ICD-10-CM | POA: Diagnosis not present

## 2024-02-02 DIAGNOSIS — I10 Essential (primary) hypertension: Secondary | ICD-10-CM | POA: Diagnosis not present

## 2024-02-02 NOTE — Telephone Encounter (Signed)
 Called patient back about message. Patient stated she woke up with her BP high 202/90. Once patient took her BP medications, her BP went down to 140's/80's. Encouraged patient to continue taking her medications as prescribed. Encouraged patient to check her BP a couple of hours after she takes her medications. Patient does have furosemide  20 mg as needed, encouraged patient if she is having any edema to take her furosemide . Patient stated she does not have any edema right now. Will send message to Dr. Renna Cary for further advisement.

## 2024-02-02 NOTE — Telephone Encounter (Signed)
 Pt c/o BP issue: STAT if pt c/o blurred vision, one-sided weakness or slurred speech.  STAT if BP is GREATER than 180/120 TODAY.  STAT if BP is LESS than 90/60 and SYMPTOMATIC TODAY  1. What is your BP concern? ^BP  2. Have you taken any BP medication today? yes  3. What are your last 5 BP readings?172/90 mon, this morn 202/90, 126/60, 119/90, 123/73 hr 67, 147/80 hr 66, 143/83 last reading 12:13pm  4. Are you having any other symptoms (ex. Dizziness, headache, blurred vision, passed out)? Headache, nausea. Requesting cb after 3

## 2024-02-03 NOTE — Telephone Encounter (Signed)
 No new orders given.  Continue current medications and continue to monitor.

## 2024-02-07 ENCOUNTER — Ambulatory Visit
Admission: RE | Admit: 2024-02-07 | Discharge: 2024-02-07 | Disposition: A | Discharge status: Home / Self Care | Payer: Self-pay | Source: Ambulatory Visit | Attending: Emergency Medicine | Admitting: Emergency Medicine

## 2024-02-07 ENCOUNTER — Ambulatory Visit (INDEPENDENT_AMBULATORY_CARE_PROVIDER_SITE_OTHER)

## 2024-02-07 VITALS — BP 136/81 | HR 95 | Temp 98.7°F | Resp 18

## 2024-02-07 DIAGNOSIS — J929 Pleural plaque without asbestos: Secondary | ICD-10-CM | POA: Diagnosis not present

## 2024-02-07 DIAGNOSIS — J45909 Unspecified asthma, uncomplicated: Secondary | ICD-10-CM | POA: Diagnosis not present

## 2024-02-07 DIAGNOSIS — R051 Acute cough: Secondary | ICD-10-CM | POA: Diagnosis not present

## 2024-02-07 DIAGNOSIS — J069 Acute upper respiratory infection, unspecified: Secondary | ICD-10-CM

## 2024-02-07 DIAGNOSIS — J4521 Mild intermittent asthma with (acute) exacerbation: Secondary | ICD-10-CM

## 2024-02-07 DIAGNOSIS — I700 Atherosclerosis of aorta: Secondary | ICD-10-CM | POA: Diagnosis not present

## 2024-02-07 DIAGNOSIS — R059 Cough, unspecified: Secondary | ICD-10-CM | POA: Diagnosis not present

## 2024-02-07 MED ORDER — ALBUTEROL SULFATE (2.5 MG/3ML) 0.083% IN NEBU
2.5000 mg | INHALATION_SOLUTION | Freq: Once | RESPIRATORY_TRACT | Status: AC
  Administered 2024-02-07: 2.5 mg via RESPIRATORY_TRACT

## 2024-02-07 MED ORDER — AZITHROMYCIN 250 MG PO TABS: 250.0000 mg | ORAL_TABLET | Freq: Every day | ORAL | 0 refills | Status: DC

## 2024-02-07 NOTE — Discharge Instructions (Addendum)
 Take the Zithromax  as directed.  Continue using your albuterol  inhaler as directed.  Follow up with your primary care provider tomorrow.  Go to the emergency department if you have worsening symptoms.

## 2024-02-07 NOTE — ED Provider Notes (Signed)
 Hannah Vasquez    CSN: [id number] Arrival date & time: 02/07/24  1330      History   Chief Complaint Chief Complaint  Patient presents with   Cough    Possible pneumonia - Entered by patient    HPI Hannah Vasquez is a 73 y.o. female.  Accompanied by her daughter, patient presents with congestion, productive cough, body aches, fatigue, headache x 2 to 3 days.  Her symptoms are worse since last night.  She denies fever, chest pain, shortness of breath.  Patient was seen at this urgent care on 01/16/2024; diagnosed with viral URI with cough; treated with Tessalon  Perles and prednisone .  Her cough improved but has lingered since this visit.  She was seen by her PCP on 01/23/2024 and noted to have clear lung sounds and stable reactive airway disease.  Her medical history includes mild intermittent reactive airway disease.  Patient last used her albuterol  inhaler 2 days ago.  The history is provided by the patient, a relative and medical records.    Past Medical History:  Diagnosis Date   Advanced hepatic fibrosis    Allergy    Anxiety    CAD (coronary artery disease)    Cataract    Chronic systolic heart failure (HCC)    DDD (degenerative disc disease), thoracolumbar    Does use hearing aid    bilateral as of 10/05/2019   Elevated alkaline phosphatase level    Gallstones    Hemorrhoids    Hepatic steatosis    History of MI (myocardial infarction) 10/07/2017   Hypertension    Mitral valve prolapse    NASH (nonalcoholic steatohepatitis)    OSA (obstructive sleep apnea)    Osteoporosis    RBBB    Sarcoidosis    Status post dilation of esophageal narrowing     Patient Active Problem List   Diagnosis Date Noted   Left-sided face pain 11/18/2021   Dizziness, nonspecific 11/03/2021   Decreased grip strength of left hand 11/03/2021   Sarcoidosis 09/10/2021   Thyroid  nodule 09/10/2021   Urinary urgency 05/21/2021   Vitamin B12 deficiency 01/29/2021   Cough 07/29/2020    OSA (obstructive sleep apnea) 06/25/2020   Early satiety 04/29/2020   Upper abdominal pain 04/29/2020   Nausea without vomiting 04/29/2020   Prediabetes 03/25/2020   Nocturia 03/25/2020   Insomnia 03/25/2020   Reactive airway disease 12/12/2019   Neuropathy 12/12/2019   Chronic systolic heart failure (HCC) 07/11/2019   Elevated serum alkaline phosphatase level 07/10/2019   Leukocytosis 07/10/2019   Bilateral hand pain 04/09/2019   Bilateral hearing loss 04/09/2019   Trigger finger, left middle finger 10/30/2018   Lumbar back pain with radiculopathy affecting lower extremity 05/22/2018   Osteopenia 05/15/2018   Lumbar radiculopathy 04/27/2018   GERD (gastroesophageal reflux disease) 04/10/2018   Shortness of breath 08/21/2017   DDD (degenerative disc disease), thoracolumbar 04/28/2017   Back pain 04/21/2017   Hypokalemia 01/06/2017   History of non-ST elevation myocardial infarction (NSTEMI) 01/01/2017   Fatigue 11/27/2014   RBBB 12/19/2013   Mitral valve prolapse    HYPERTENSION, BENIGN 08/27/2010   Anxiety state 06/27/2009   Coronary atherosclerosis 06/27/2009   Mitral valve disorder 06/27/2009   Hemorrhoids 06/27/2009   Allergic rhinitis 06/27/2009   Disorder of bone and cartilage 11/11/2006    Past Surgical History:  Procedure Laterality Date   BASAL CELL CARCINOMA EXCISION  02/05/2016   Dr. Jadine, [facility]   BLADDER SURGERY  11/15/1995  bladder neck suspension, urinary incontinence   CARDIAC CATHETERIZATION  01/03/2017 DES LAD   CATARACT EXTRACTION Left 04/30/2016   Dr. Milan, [facility]   CORONARY STENT INTERVENTION N/A 01/03/2017   Procedure: Coronary Stent Intervention;  Surgeon: Mady Bruckner, MD;  Location: [facility] INVASIVE CV LAB;  Service: Cardiovascular;  Laterality: N/A;   CORONARY ULTRASOUND/IVUS N/A 01/03/2017   Procedure: Intravascular Ultrasound/IVUS;  Surgeon: Mady Bruckner, MD;  Location: [facility] INVASIVE CV LAB;  Service:  Cardiovascular;  Laterality: N/A;   DENTAL SURGERY  06/20/2016   Tooth Implant; Dr. Ann   KNEE SURGERY  2003   torn meniscus   LEFT HEART CATH AND CORONARY ANGIOGRAPHY N/A 01/03/2017   Procedure: Left Heart Cath and Coronary Angiography;  Surgeon: Mady Bruckner, MD;  Location: [facility] INVASIVE CV LAB;  Service: Cardiovascular;  Laterality: N/A;   MOHS SURGERY Right 2017   right side of face   ROTATOR CUFF REPAIR     SHOULDER SURGERY Right 2005    OB History     Gravida  2   Para  2   Term  2   Preterm      AB      Living  2      SAB      IAB      Ectopic      Multiple      Live Births  2            Home Medications    Prior to Admission medications   Medication Sig Start Date End Date Taking? Authorizing Provider  azithromycin  (ZITHROMAX ) 250 MG tablet Take 1 tablet (250 mg total) by mouth daily. Take first 2 tablets together, then 1 every day until finished. 02/07/24  Yes Corlis Burnard DEL, NP  acetaminophen  (TYLENOL ) 500 MG tablet Take 500 mg by mouth every 6 (six) hours as needed for moderate pain.    [provider]  albuterol  (VENTOLIN  HFA) 108 (90 Base) MCG/ACT inhaler Inhale 1 puff into the lungs every 6 (six) hours as needed for wheezing or shortness of breath. 12/12/19   Velma Raisin, MD  amLODipine  (NORVASC ) 5 MG tablet TAKE ONE TABLET BY MOUTH ONCE A DAY 06/16/23   Jeffrie Oneil BROCKS, MD  aspirin  EC 81 MG tablet Take 81 mg by mouth every morning.     [provider]  atorvastatin  (LIPITOR ) 80 MG tablet TAKE ONE TABLET BY MOUTH DAILY AT SIX IN THE EVENING 05/09/23   Jeffrie Oneil BROCKS, MD  benzonatate  (TESSALON ) 100 MG capsule Take 1 capsule (100 mg total) by mouth every 8 (eight) hours. Patient not taking: Reported on 02/07/2024 01/16/24   Teresa Shelba SAUNDERS, NP  Calcium -Magnesium-Vitamin D  (CALCIUM  1200+D3 PO) Take 1,200 mg by mouth daily.    [provider]  Cyanocobalamin  (VITAMIN B12 PO) Take 1 tablet by mouth daily.    [provider]  fluticasone  (FLONASE ) 50 MCG/ACT nasal spray Place 1 spray into both nostrils daily as needed for allergies.    [provider]  furosemide  (LASIX ) 20 MG tablet Take 1 tablet (20 mg total) by mouth daily as needed. 01/01/19   Jeffrie Oneil BROCKS, MD  gabapentin  (NEURONTIN ) 300 MG capsule Take 1 capsule (300 mg total) by mouth 3 (three) times daily. 05/21/21   Velma Raisin, MD  isosorbide  mononitrate (IMDUR ) 30 MG 24 hr tablet Take 1 tablet (30 mg total) by mouth daily. 02/01/24   Jeffrie Oneil BROCKS, MD  loratadine  (CLARITIN ) 10 MG tablet Take 1 tablet (  10 mg total) by mouth every morning. 11/08/13   Aron, Talia M, MD  losartan  (COZAAR ) 100 MG tablet TAKE ONE TABLET BY MOUTH ONCE A DAY 03/22/23   Jeffrie Oneil BROCKS, MD  meloxicam  (MOBIC ) 15 MG tablet Take 15 mg by mouth daily. 12/23/23   [provider]  metoprolol  succinate (TOPROL -XL) 25 MG 24 hr tablet TAKE ONE TABLET BY MOUTH ONCE A DAY 05/09/23   Jeffrie Oneil BROCKS, MD  Multiple Vitamin (MULTIVITAMIN) tablet Take 1 tablet by mouth every morning.     [provider]  MYRBETRIQ 50 MG TB24 tablet Take 50 mg by mouth daily. 09/28/21   [provider]  nitroGLYCERIN  (NITROSTAT ) 0.4 MG SL tablet DISSOLVE ONE TABLET UNDER TONGUE AS NEEDED FOR CHEST PAIN. MAY REPEAT FIVE MINUTES APART THREE TIMES IF NEEDED 04/27/23   Jeffrie Oneil BROCKS, MD  Omega-3 Fatty Acids (FISH OIL ) 1200 MG CAPS Take 1,200 mg by mouth every morning.    [provider]  omeprazole  (PRILOSEC) 40 MG capsule TAKE ONE CAPSULE (40 MG TOTAL) BY MOUTH DAILY. 01/17/24   Pyrtle, Gordy HERO, MD  ondansetron  (ZOFRAN -ODT) 4 MG disintegrating tablet Take 1 tablet (4 mg total) by mouth every 8 (eight) hours as needed for nausea or vomiting. 04/20/22   Velma Raisin, MD  potassium chloride  SA (KLOR-CON  M) 20 MEQ tablet TAKE TWO TABLETS BY MOUTH TWICE DAILY 11/07/23   Jeffrie Oneil BROCKS, MD  predniSONE  (STERAPRED UNI-PAK 21 TAB) 10 MG (21) TBPK tablet Take by mouth daily. Take 6 tabs  by mouth daily  for 1 days, then 5 tabs for 1 days, then 4 tabs for 1 days, then 3 tabs for 1 days, 2 tabs for 1 days, then 1 tab by mouth daily for 1 days 01/16/24   Teresa Shelba SAUNDERS, NP  spironolactone  (ALDACTONE ) 25 MG tablet Take 1 tablet (25 mg total) by mouth daily. 09/21/23   Jeffrie Oneil BROCKS, MD  tiZANidine  (ZANAFLEX ) 4 MG tablet Take by mouth. 03/01/22   [provider]  traMADol  (ULTRAM ) 50 MG tablet Take 1 tablet (50 mg total) by mouth every 6 (six) hours as needed for severe pain. 06/09/22   Webb, Padonda B, FNP  triamcinolone  cream (KENALOG ) 0.1 % Apply 1 Application topically 2 (two) times daily as needed. 03/25/23   Arloa Suzen RAMAN, NP  vitamin E 180 MG (400 UNITS) capsule Take 800 Units by mouth in the morning and at bedtime.    [provider]    Family History Family History  Problem Relation Age of Onset   Mitral valve prolapse Mother    Hypertension Mother    Hypertension Father    Brain cancer Father    Colon cancer Neg Hx    Stomach cancer Neg Hx    Esophageal cancer Neg Hx    Pancreatic cancer Neg Hx     Social History Social History   Tobacco Use   Smoking status: Never   Smokeless tobacco: Never  Vaping Use   Vaping status: Never Used  Substance Use Topics   Alcohol use: No    Alcohol/week: 0.0 standard drinks of alcohol   Drug use: No     Allergies   Promethazine -dm, Bactroban  [mupirocin  calcium ], Doxycycline , Codeine, Mupirocin , Valtrex  [valacyclovir  hcl], and Penicillins   Review of Systems Review of Systems  Constitutional:  Positive for fatigue. Negative for chills and fever.  HENT:  Positive for congestion. Negative for ear pain and sore throat.   Respiratory:  Positive for cough  and chest tightness. Negative for shortness of breath.   Cardiovascular:  Negative for chest pain and palpitations.  Neurological:  Positive for headaches.     Physical Exam Triage Vital Signs ED Triage Vitals  Encounter Vitals Group     BP       Girls Systolic BP Percentile      Girls Diastolic BP Percentile      Boys Systolic BP Percentile      Boys Diastolic BP Percentile      Pulse      Resp      Temp      Temp src      SpO2      Weight      Height      Head Circumference      Peak Flow      Pain Score      Pain Loc      Pain Education      Exclude from Growth Chart    No data found.  Updated Vital Signs BP 136/81   Pulse 95   Temp 98.7 F (37.1 C)   Resp 18   LMP  (LMP Unknown)   SpO2 97%   Visual Acuity Right Eye Distance:   Left Eye Distance:   Bilateral Distance:    Right Eye Near:   Left Eye Near:    Bilateral Near:     Physical Exam Constitutional:      General: She is not in acute distress. HENT:     Right Ear: Tympanic membrane normal.     Left Ear: Tympanic membrane normal.     Nose: Rhinorrhea present.     Mouth/Throat:     Mouth: Mucous membranes are moist.     Pharynx: Oropharynx is clear.   Cardiovascular:     Rate and Rhythm: Normal rate and regular rhythm.     Heart sounds: Normal heart sounds.  Pulmonary:     Effort: Pulmonary effort is normal.     Breath sounds: Normal breath sounds. Decreased air movement present. No wheezing.   Neurological:     Mental Status: She is alert.      UC Treatments / Results  Labs (all labs ordered are listed, but only abnormal results are displayed) Labs Reviewed - No data to display  EKG   Radiology DG Chest 2 View Result Date: 02/07/2024 CLINICAL DATA:  cough, hx of reactive airway disease EXAM: CHEST - 2 VIEW COMPARISON:  November 16, 2021 FINDINGS: Biapical pleural thickening. No focal airspace consolidation, pleural effusion, or pneumothorax. No cardiomegaly. Aortic atherosclerosis. No acute fracture or destructive lesion. Multilevel degenerative disc disease of the spine. IMPRESSION: No acute cardiopulmonary abnormality. Electronically Signed   By: Rogelia Myers M.D.   On: 02/07/2024 14:26    Procedures Procedures (including  critical care time)  Medications Ordered in UC Medications  albuterol  (PROVENTIL ) (2.5 MG/3ML) 0.083% nebulizer solution 2.5 mg (2.5 mg Nebulization Given 02/07/24 1419)    Initial Impression / Assessment and Plan / UC Course  I have reviewed the triage vital signs and the nursing notes.  Pertinent labs & imaging results that were available during my care of the patient were reviewed by me and considered in my medical decision making (see chart for details).   Cough, asthma exacerbation, acute upper respiratory infection.  O2 sat 97% on room air.  No respiratory distress.  Albuterol  nebulizer treatment given here.  Improved air movement after albuterol .  Patient was treated with prednisone  3  weeks ago; she notes that this made her feel like she was jumping out of her skin for the first few days but she has taken it in the past without difficulty.  Because of this, no prednisone  prescribed today.  Treating with Zithromax .  Encourage patient to use her albuterol  inhaler every 4-6 hours while awake for the next few days.  Instructed her to follow-up with her PCP tomorrow.  ED precautions given.  Education provided on asthma.  Patient and her daughter agree to plan of care.  Final Clinical Impressions(s) / UC Diagnoses   Final diagnoses:  Acute cough  Mild intermittent asthma with acute exacerbation  Acute upper respiratory infection     Discharge Instructions      Take the Zithromax  as directed.  Continue using your albuterol  inhaler as directed.  Follow up with your primary care provider tomorrow.  Go to the emergency department if you have worsening symptoms.        ED Prescriptions     Medication Sig Dispense Auth. Provider   azithromycin  (ZITHROMAX ) 250 MG tablet Take 1 tablet (250 mg total) by mouth daily. Take first 2 tablets together, then 1 every day until finished. 6 tablet Corlis Burnard DEL, NP      PDMP not reviewed this encounter.   Corlis Burnard DEL, NP 02/07/24 [phone]

## 2024-02-07 NOTE — ED Triage Notes (Signed)
 Patient to Urgent Care with complaints of headaches/ congestion/ productive cough/ chest soreness/ body aches/ low energy.   Symptoms started 2-3 days ago. Worsened over night. Treated for a URI June 2nd (doesn't feel like she fully recovered).  Meds: tylenol 

## 2024-02-11 NOTE — Progress Notes (Addendum)
 COVID Vaccine received:  []  No []  Yes Date of any COVID positive Test in last 90 days:  PCP - Lynwood Null, MD at [facility] [phone] (Work) [phone] (Fax)  Cardiologist - Oneil Parchment, MD clearance in 01-30-24 Epic note and scanned to media Sleep Med- Wilbert Bihari, MD  Pulmonology- Halina Picking, MD at [place]  [phone]  Fax) [phone]  Chest x-ray - 02-07-24  2v  Epic EKG -01-30-2024  Epic   Stress Test - Lexiscan  01-24-2020  Epic ECHO -01-24-2020  Epic  Cardiac Cath - 01-03-2017  LHC / Cors- DES x 2, by Dr. Mady CT Coronary Calcium  score:   Pacemaker / ICD device [x]  No []  Yes   Spinal Cord Stimulator:[x]  No []  Yes       History of Sleep Apnea? []  No [x]  Yes   CPAP used?- []  No [x]  Yes    Does the patient monitor blood sugar?   []  N/A   []  No []  Yes  Patient has: []  NO Hx DM   [x]  Pre-DM   []  DM1  []   DM2 Last A1c was:  6.4 on 01-30-24    no meds  Blood Thinner / Instructions:  none Aspirin  Instructions: ASA 81 mg  Hold x 5 days, last dose: Tuesday  02-14-24  ERAS Protocol Ordered: []  No  [x]  Yes PRE-SURGERY []  ENSURE  [x]  G2   Patient is to be NPO after: 0515  Dental hx: []  Dentures:  []  N/A      []  Bridge or Partial:                   []  Loose or Damaged teeth:   Comments: Patient was given the 5 CHG shower / bath instructions for TKA surgery along with 2 bottles of the CHG soap. Patient will start this on:    02-16-24           Activity level: Able to walk up 2 flights of stairs without becoming significantly short of breath or having chest pain?  []  No   []    Yes   Anesthesia review: CAD-hx STEMI- LHC-DES x 2 (2019) Isch. CM / CHF, HTN, PVCs, MVP, liver disease, sarcoidosis, carotid stenosis,  OSA-CPAP   Patient was recently (02-07-24) seen at [facility] for URI / cough, CXR negative. She has reactive airway disease.   Patient denies shortness of breath, fever, cough and chest pain at PAT appointment.  Patient verbalized understanding and agreement to the  Pre-Surgical Instructions that were given to them at this PAT appointment. Patient was also educated of the need to review these PAT instructions again prior to her surgery.I reviewed the appropriate phone numbers to call if they have any and questions or concerns.

## 2024-02-11 NOTE — Patient Instructions (Addendum)
 SURGICAL WAITING ROOM VISITATION Patients having surgery or a procedure may have no more than 2 support people in the waiting area - these visitors may rotate in the visitor waiting room.   If the patient needs to stay at the hospital during part of their recovery, the visitor guidelines for inpatient rooms apply.  PRE-OP  VISITATION  Pre-op  nurse will coordinate an appropriate time for 1 support person to accompany the patient in pre-op .  This support person may not rotate.  This visitor will be contacted when the time is appropriate for the visitor to come back in the pre-op  area.  Please refer to the [facility] website for the visitor guidelines for Inpatients (after your surgery is over and you are in a regular room).  You are not required to quarantine at this time prior to your surgery. However, you must do this: Hand Hygiene often Do NOT share personal items Notify your provider if you are in close contact with someone who has COVID or you develop fever 100.4 or greater, new onset of sneezing, cough, sore throat, shortness of breath or body aches.  If you test positive for Covid or have been in contact with anyone that has tested positive in the last 10 days please notify you surgeon.    Your procedure is scheduled on:  MONDAY  February 20, 2024  Report to [facility] Main Entrance: [place] entrance where the [employer] is available.   Report to admitting at: 05:45    AM  Call this number if you have any questions or problems the morning of surgery [phone]  Do not eat food after Midnight the night prior to your surgery/procedure.  After Midnight you may have the following liquids until  05:15 AM DAY OF SURGERY  Clear Liquid Diet Water Black Coffee (sugar ok, NO MILK/CREAM OR CREAMERS)  Tea (sugar ok, NO MILK/CREAM OR CREAMERS) regular and decaf                             Plain Jell-O  with no fruit (NO RED)                                           Fruit ices  (not with fruit pulp, NO RED)                                     Popsicles (NO RED)                                                                  Juice: NO CITRUS JUICES: only apple, WHITE grape, WHITE cranberry Sports drinks like Gatorade or Powerade (NO RED)                   The day of surgery:  Drink ONE (1) Pre-Surgery G2 at  05:15 AM the morning of surgery. Drink in one sitting. Do not sip.  This drink was given to you during your hospital pre-op  appointment visit. Nothing else to drink after completing the  Pre-Surgery G2 : No candy, chewing gum or throat lozenges.    FOLLOW ANY ADDITIONAL PRE OP INSTRUCTIONS YOU RECEIVED FROM YOUR SURGEON'S OFFICE!!!   Oral Hygiene is also important to reduce your risk of infection.        Remember - BRUSH YOUR TEETH THE MORNING OF SURGERY WITH YOUR REGULAR TOOTHPASTE  Do NOT smoke after Midnight the night before surgery.  STOP TAKING all Vitamins, Herbs and supplements 1 week before your surgery.   Take ONLY these medicines the morning of surgery with A SIP OF WATER: omeprazole , amlodipine , isosorbide , metoprolol , gabapentin , loratadine  and Tylenol  if needed. You may use your Flonase  nasal spray and the Albuterol  inhaler if needed. Please bring your inhaler with you on the day of surgery.  DO NOT TAKE SPIRONOLACTONE , FUROSEMIDE  OR LOSARTAN  the morning of your surgery   If You have been diagnosed with Sleep Apnea - Bring CPAP mask and tubing day of surgery. We will provide you with a CPAP machine on the day of your surgery.                   You may not have any metal on your body including hair pins, jewelry, and body piercing  Do not wear make-up, lotions, powders, perfumes  or deodorant  Do not wear nail polish including gel and S&S, artificial / acrylic nails, or any other type of covering on natural nails including finger and toenails. If you have artificial nails, gel coating, etc., that needs to be removed by a nail salon, Please  have this removed prior to surgery. Not doing so may mean that your surgery could be cancelled or delayed if the Surgeon or anesthesia staff feels like they are unable to monitor you safely.   Do not shave 48 hours prior to surgery to avoid nicks in your skin which may contribute to postoperative infections.   Contacts, Hearing Aids, dentures or bridgework may not be worn into surgery. DENTURES WILL BE REMOVED PRIOR TO SURGERY PLEASE DO NOT APPLY Poly grip OR ADHESIVES!!!  You may bring a small overnight bag with you on the day of surgery, only pack items that are not valuable. [facility] IS NOT RESPONSIBLE   FOR VALUABLES THAT ARE LOST OR STOLEN.   Do not bring your home medications to the hospital. The Pharmacy will dispense medications listed on your medication list to you during your admission in the Hospital.  Special Instructions: Bring a copy of your healthcare power of attorney and living will documents the day of surgery, if you wish to have them scanned into your [facility]- EPIC  Please read over the following fact sheets you were given: IF YOU HAVE QUESTIONS ABOUT YOUR PRE-OP  INSTRUCTIONS, PLEASE CALL [phone].     Pre-operative 5 CHG Bath Instructions   You can play a key role in reducing the risk of infection after surgery. Your skin needs to be as free of germs as possible. You can reduce the number of germs on your skin by washing with CHG (chlorhexidine gluconate) soap before surgery. CHG is an antiseptic soap that kills germs and continues to kill germs even after washing.   DO NOT use if you have an allergy to chlorhexidine/CHG or antibacterial soaps. If your skin becomes reddened or irritated, stop using the CHG and notify one of our RNs at [phone]  Please shower with the CHG soap starting 4 days before surgery using the following schedule: START SHOWERS ON   THURSDAY  February 16, 2024                                                                                                                                                                               Please keep in mind the following:  DO NOT shave, including legs and underarms, starting the day of your first shower.   You may shave your face at any point before/day of surgery.   Place clean sheets on your bed the day you start using CHG soap. Use a clean washcloth (not used since being washed) for each shower. DO NOT sleep with pets once you start using the CHG.   CHG Shower Instructions:  If you choose to wash your hair and private area, wash first with your normal shampoo/soap.  After you use shampoo/soap, rinse your hair and body thoroughly to remove shampoo/soap residue.  Turn the water OFF and apply about 3 tablespoons (45 ml) of CHG soap to a CLEAN washcloth.  Apply CHG soap ONLY FROM YOUR NECK DOWN TO YOUR TOES (washing for 3-5 minutes)  DO NOT use CHG soap on face, private areas, open wounds, or sores.  Pay special attention to the area where your surgery is being performed.  If you are having back surgery, having someone wash your back for you may be helpful.  Wait 2 minutes after CHG soap is applied, then you may rinse off the CHG soap.  Pat dry with a clean towel  Put on clean clothes/pajamas   If you choose to wear lotion, please use ONLY the CHG-compatible lotions on the back of this paper.     Additional instructions for the day of surgery: DO NOT APPLY any lotions, deodorants, cologne, or perfumes.   Put on clean/comfortable clothes.  Brush your teeth.  Ask your nurse before applying any prescription medications to the skin.      CHG Compatible Lotions   Aveeno Moisturizing lotion  Cetaphil Moisturizing Cream  Cetaphil Moisturizing Lotion  Clairol Herbal Essence Moisturizing Lotion, Dry Skin  Clairol Herbal Essence Moisturizing Lotion, Extra Dry Skin  Clairol Herbal Essence Moisturizing Lotion, Normal Skin  Curel Age Defying Therapeutic  Moisturizing Lotion with Alpha Hydroxy  Curel Extreme Care Body Lotion  Curel Soothing Hands Moisturizing Hand Lotion  Curel Therapeutic Moisturizing Cream, Fragrance-Free  Curel Therapeutic Moisturizing Lotion, Fragrance-Free  Curel Therapeutic Moisturizing Lotion, Original Formula  Eucerin Daily Replenishing Lotion  Eucerin Dry Skin Therapy Plus Alpha Hydroxy Crme  Eucerin Dry Skin Therapy Plus Alpha Hydroxy Lotion  Eucerin Original Crme  Eucerin Original Lotion  Eucerin Plus Crme Eucerin Plus Lotion  Eucerin TriLipid Replenishing Lotion  Keri Anti-Bacterial Hand Lotion  Keri Deep Conditioning Original Lotion Dry  Skin Formula Softly Scented  Keri Deep Conditioning Original Lotion, Fragrance Free Sensitive Skin Formula  Keri Lotion Fast Absorbing Fragrance Free Sensitive Skin Formula  Keri Lotion Fast Absorbing Softly Scented Dry Skin Formula  Keri Original Lotion  Keri Skin Renewal Lotion Keri Silky Smooth Lotion  Keri Silky Smooth Sensitive Skin Lotion  Nivea Body Creamy Conditioning Oil  Nivea Body Extra Enriched Lotion  Nivea Body Original Lotion  Nivea Body Sheer Moisturizing Lotion Nivea Crme  Nivea Skin Firming Lotion  NutraDerm 30 Skin Lotion  NutraDerm Skin Lotion  NutraDerm Therapeutic Skin Cream  NutraDerm Therapeutic Skin Lotion  ProShield Protective Hand Cream  Provon moisturizing lotion   FAILURE TO FOLLOW THESE INSTRUCTIONS MAY RESULT IN THE CANCELLATION OF YOUR SURGERY  PATIENT SIGNATURE_________________________________  NURSE SIGNATURE__________________________________  ________________________________________________________________________         Hannah Vasquez    An incentive spirometer is a tool that can help keep your lungs clear and active. This tool measures how well you are filling your lungs with each breath. Taking long deep breaths may help reverse or decrease the chance of developing breathing (pulmonary) problems  (especially infection) following: A long period of time when you are unable to move or be active. BEFORE THE PROCEDURE  If the spirometer includes an indicator to show your best effort, your nurse or respiratory therapist will set it to a desired goal. If possible, sit up straight or lean slightly forward. Try not to slouch. Hold the incentive spirometer in an upright position. INSTRUCTIONS FOR USE  Sit on the edge of your bed if possible, or sit up as far as you can in bed or on a chair. Hold the incentive spirometer in an upright position. Breathe out normally. Place the mouthpiece in your mouth and seal your lips tightly around it. Breathe in slowly and as deeply as possible, raising the piston or the ball toward the top of the column. Hold your breath for 3-5 seconds or for as long as possible. Allow the piston or ball to fall to the bottom of the column. Remove the mouthpiece from your mouth and breathe out normally. Rest for a few seconds and repeat Steps 1 through 7 at least 10 times every 1-2 hours when you are awake. Take your time and take a few normal breaths between deep breaths. The spirometer may include an indicator to show your best effort. Use the indicator as a goal to work toward during each repetition. After each set of 10 deep breaths, practice coughing to be sure your lungs are clear. If you have an incision (the cut made at the time of surgery), support your incision when coughing by placing a pillow or rolled up towels firmly against it. Once you are able to get out of bed, walk around indoors and cough well. You may stop using the incentive spirometer when instructed by your caregiver.  RISKS AND COMPLICATIONS Take your time so you do not get dizzy or light-headed. If you are in pain, you may need to take or ask for pain medication before doing incentive spirometry. It is harder to take a deep breath if you are having pain. AFTER USE Rest and breathe slowly and  easily. It can be helpful to keep track of a log of your progress. Your caregiver can provide you with a simple table to help with this. If you are using the spirometer at home, follow these instructions: SEEK MEDICAL CARE IF:  You are having difficultly using the spirometer. You  have trouble using the spirometer as often as instructed. Your pain medication is not giving enough relief while using the spirometer. You develop fever of 100.5 F (38.1 C) or higher.                                                                                                    SEEK IMMEDIATE MEDICAL CARE IF:  You cough up bloody sputum that had not been present before. You develop fever of 102 F (38.9 C) or greater. You develop worsening pain at or near the incision site. MAKE SURE YOU:  Understand these instructions. Will watch your condition. Will get help right away if you are not doing well or get worse. Document Released: 12/13/2006 Document Revised: 10/25/2011 Document Reviewed: 02/13/2007 [facility] Patient Information 2014 [place], [place].      If you would like to see a video about joint replacement:   [email or web address]

## 2024-02-13 ENCOUNTER — Encounter (HOSPITAL_COMMUNITY)
Admission: RE | Admit: 2024-02-13 | Discharge: 2024-02-13 | Disposition: A | Discharge status: Home / Self Care | Source: Ambulatory Visit | Attending: Orthopedic Surgery | Admitting: Orthopedic Surgery

## 2024-02-13 DIAGNOSIS — Z01818 Encounter for other preprocedural examination: Secondary | ICD-10-CM

## 2024-02-14 DIAGNOSIS — Z01811 Encounter for preprocedural respiratory examination: Secondary | ICD-10-CM | POA: Diagnosis not present

## 2024-02-14 DIAGNOSIS — G4733 Obstructive sleep apnea (adult) (pediatric): Secondary | ICD-10-CM | POA: Diagnosis not present

## 2024-02-14 DIAGNOSIS — J4540 Moderate persistent asthma, uncomplicated: Secondary | ICD-10-CM | POA: Diagnosis not present

## 2024-02-14 NOTE — Progress Notes (Signed)
 DIVISION OF PULMONARY AND CRITICAL CARE MEDICINE                              FOLLOW UP ENCOUNTER     Chief complaint: Moderate persistent asthma and OSA  History of Present Illness Hannah Vasquez is a 73 year old female with sleep apnea and asthma who presents for pre-operative pulmonary evaluation before left knee replacement surgery.  She is scheduled for left knee replacement surgery on Monday, which is expected to last about an hour and a half. The pre-operative pulmonary evaluation is to assess the risk associated with general anesthesia, particularly due to her history of sleep apnea.  She has sleep apnea and uses a CPAP machine regularly. The machine is functioning well. She takes Claritin  for allergies.  She has a history of moderate persistent asthma with no current symptoms such as chest tightness, wheezing, coughing, or phlegm production. She uses an albuterol  inhaler as needed and has a prescription from February.  Her last pulmonary function test in January or February 2025 showed an FEV1 of 1.97 liters, indicating a nonobstructive pattern.   Past Medical History:   Past Medical History:  Diagnosis Date  . GERD (gastroesophageal reflux disease)   . Hemorrhoid   . History of myocardial infarction   . Irregular heart rhythm   . Sleep apnea     Past Surgical History:   Past Surgical History:  Procedure Laterality Date  . HYSTERECTOMY    . SHOULDER ARTHROSCOPY W/ ACROMIAL REPAIR      Allergies:   Allergies  Allergen Reactions  . Promethazine -Dm Nausea And Vomiting  . Bactroban  [Mupirocin ] Swelling  . Codeine Nausea  . Doxycycline  Nausea  . Penicillin Nausea and Rash  . Promethazine  Nausea  . Valtrex  [Valacyclovir ] Swelling    Current Medications:   Prior to Admission medications  Medication Sig Taking? Last Dose  acetaminophen  (TYLENOL ) 500 MG tablet Take 500 mg by mouth every 6 (six) hours Yes Taking  albuterol  MDI,  PROVENTIL , VENTOLIN , PROAIR , HFA 90 mcg/actuation inhaler Inhale 1 Puff into the lungs every 6 (six) hours as needed for Wheezing Yes Taking  amLODIPine  (NORVASC ) 5 MG tablet Take 5 mg by mouth once daily Yes Taking  aspirin  81 MG EC tablet Take 81 mg by mouth once daily Yes Taking  atorvastatin  (LIPITOR ) 80 MG tablet Take 80 mg by mouth once daily Yes Taking  calcium  carbonate-vitamin D3 (OS-CAL 500+D) 500 mg-10 mcg (400 unit) tablet Take 1 tablet by mouth once daily Yes Taking  fluticasone  propionate (FLONASE ) 50 mcg/actuation nasal spray Place 1 spray into both nostrils once daily as needed for Rhinitis Yes Taking  FUROsemide  (LASIX ) 20 MG tablet Take 20 mg by mouth once daily as needed for Edema Yes Taking  gabapentin  (NEURONTIN ) 300 MG capsule Take 300 mg by mouth 3 (three) times daily Yes Taking  isosorbide  mononitrate (IMDUR ) 30 MG ER tablet Take 30 mg by mouth once daily Yes Taking  loratadine  (CLARITIN ) 10 mg capsule Take 10 mg by mouth once daily Yes Taking  losartan  (COZAAR ) 100 MG tablet Take 100 mg by mouth once daily Yes Taking  meloxicam  (MOBIC ) 7.5 MG tablet Take 7.5 mg by mouth once daily Yes Taking  metoprolol  succinate (TOPROL -XL) 25 MG XL  tablet Take 25 mg by mouth once daily Yes Taking  multivitamin tablet Take 1 tablet by mouth once daily Yes Taking  nitroGLYcerin  (NITROSTAT ) 0.4 MG SL tablet Place 0.4 mg under the tongue every 5 (five) minutes as needed for Chest pain May take up to 3 doses. Yes Taking  omega 3-dha-epa-fish oil  (FISH OIL ) 1,200 (144-216) mg Cap Take 1,200 mg by mouth once daily Yes Taking  omeprazole  (PRILOSEC) 40 MG DR capsule Take 1 capsule (40 mg total) by mouth once daily Yes Taking  potassium chloride  (KLOR-CON  M20) 20 MEQ ER tablet Take 20 mEq by mouth once daily Yes Taking  spironolactone  (ALDACTONE ) 25 MG tablet Take 25 mg by mouth once daily Yes Taking  tiZANidine  (ZANAFLEX ) 4 MG tablet Take 4 mg by mouth 3 (three) times daily Yes Taking  traMADoL   (ULTRAM ) 50 mg tablet Take 50 mg by mouth every 6 (six) hours as needed for Pain Yes Taking  vitamin E 400 UNIT capsule Take 400 Units by mouth 2 (two) times daily Yes Taking    Family History:   Family History  Problem Relation Name Age of Onset  . Osteoporosis (Thinning of bones) Mother    . Dementia Mother    . Brain cancer Father      Social History:   Social History   Socioeconomic History  . Marital status: Divorced  Tobacco Use  . Smoking status: Never  . Smokeless tobacco: Never  Vaping Use  . Vaping status: Never Used  Substance and Sexual Activity  . Alcohol use: Not Currently  . Drug use: Never  . Sexual activity: Not Currently   Social Drivers of Health   Financial Resource Strain: Low Risk  (10/04/2023)   Overall Financial Resource Strain (CARDIA)   . Difficulty of Paying Living Expenses: Not hard at all  Food Insecurity: No Food Insecurity (10/04/2023)   Hunger Vital Sign   . Worried About Programme researcher, broadcasting/film/video in the Last Year: Never true   . Ran Out of Food in the Last Year: Never true  Transportation Needs: No Transportation Needs (10/04/2023)   PRAPARE - Transportation   . Lack of Transportation (Medical): No   . Lack of Transportation (Non-Medical): No  Physical Activity: Insufficiently Active (11/11/2020)   Received from [facility]   Exercise Vital Sign   . On average, how many days per week do you engage in moderate to strenuous exercise (like a brisk walk)?: 2 days   . On average, how many minutes do you engage in exercise at this level?: 50 min  Stress: No Stress Concern Present (11/11/2020)   Received from [facility] - Occupational Stress Questionnaire   . Feeling of Stress : Not at all  Housing Stability: Unknown (10/04/2023)   Housing Stability Vital Sign   . Unable to Pay for Housing in the Last Year: No   . Homeless in the Last Year: No    Review of Systems:   A 10 point review of systems is  negative, except for the pertinent positives and negatives detailed in the HPI.  Vitals:   Vitals:   02/14/24 1544  BP: (!) 142/75  BP Location: Left upper arm  Patient Position: Sitting  BP Cuff Size: Adult  Pulse: 82  SpO2: 98%  Weight: 73.5 kg (162 lb)  Height: 157.5 cm (5' 2.01)     Body mass index is 29.62 kg/m.  Physical Exam:  Physical Exam Vitals and nursing  note reviewed.  Constitutional:      General: She is not in acute distress.    Appearance: Normal appearance. She is not ill-appearing, toxic-appearing or diaphoretic.  HENT:     Head: Normocephalic and atraumatic.     Right Ear: External ear normal.     Left Ear: External ear normal.   Eyes:     General:        Right eye: No discharge.        Left eye: No discharge.     Extraocular Movements: Extraocular movements intact.     Pupils: Pupils are equal, round, and reactive to light.    Cardiovascular:     Rate and Rhythm: Normal rate and regular rhythm.     Pulses: Normal pulses.     Heart sounds: Normal heart sounds. No murmur heard.    No friction rub. No gallop.  Abdominal:     General: Bowel sounds are normal.   Skin:    General: Skin is warm and dry.     Capillary Refill: Capillary refill takes less than 2 seconds.   Neurological:     Mental Status: She is alert.     Lab and Imaging Results:   Results DIAGNOSTIC FEV1: 1.97 liters, 100% in a nonobstructive pattern (08/2023)    Assessment and Plan:   Diagnoses and all orders for this visit:  OSA on CPAP  Moderate persistent asthma, unspecified whether complicated (HHS-HCC)  Preop pulmonary/respiratory exam    Assessment & Plan Moderate persistent asthma Asthma is well-managed with no symptoms of chest tightness, wheezing, coughing, or phlegm production. Last pulmonary function test in January/February 2025 showed FEV1 of 1.97 liters, indicating 100% in a nonobstructive pattern. - Continue current asthma management regimen. -  Ensure inhaler from February is available for use as needed.  Obstructive sleep apnea Obstructive sleep apnea is managed with CPAP, with no reported issues. - Continue using CPAP machine as prescribed.  Presurgical pulmonary evaluation Preoperative evaluation for left knee replacement surgery. Discussed risks associated with general anesthesia due to sleep apnea, with a calculated risk of adverse event during surgery at 1.07% and a 99% chance of a good outcome. Surgery expected to last 1.5 hours. - Proceed with left knee replacement surgery as planned.   Pulmonary preoperative evaluation   ARISCAT ([place])  Preoperative pulmonary risk index in adults- low risk: 1.6% pulmonary postoperative complication rate Arozullah respiratory failure index- low risk: 1.8% pulmonary postoperative complication rate Charlanne calculator for postoperative respiratory failure- low risk: 1.07% probability of postoperative respiratory failure  There are several pulmonary complications that can occur after surgery. These include prolonged mechanical ventilation, hypoxemia, atelectasis, pneumonia, and exacerbations of previous lung disease such as COPD or asthma. These risks are increased by the type of surgery (thoracic and upper abdominal surgeries carrying higher risks), length of surgery (longer surgery have increased risk), and the type of anesthesia. These complications are also affected by active smoking status as well a significant underlying lung disease (obstruction and restriction), older age, and functional status and exercise capacity.  Based on my history, physical exam, and review of available imaging/studies, this patient is at LOW risk of the aforementioned post-operative pulmonary complication.  However, from a pulmonary standpoint, there are no absolute contraindication in proceeding with elective surgery.  I would recommend in the postoperative phase aggressive pulmonary hygiene.  This included  aspiration precautions if intubated, early mobilization with physical therapy and out of bed to chair.  The patient should be given an  incentive spirometer as well.  I spent a total of 42 minutes in both face-to-face and non-face-to-face activities, excluding procedures performed, for this visit on the date of this encounter.     This note has been created using dictation software tool and any typographical errors are purely unintentional.  Patient received an After Visit Summary

## 2024-02-15 NOTE — Progress Notes (Signed)
 Office visit note faxed to [facility]/Dr.Frank Aluisio's office for pre-op  pulmonary clearance. (Fax #'s [phone] & [phone]).

## 2024-02-16 ENCOUNTER — Other Ambulatory Visit: Payer: Self-pay

## 2024-02-16 ENCOUNTER — Encounter (HOSPITAL_COMMUNITY)
Admission: RE | Admit: 2024-02-16 | Discharge: 2024-02-16 | Disposition: A | Discharge status: Home / Self Care | Source: Ambulatory Visit | Attending: Orthopedic Surgery | Admitting: Orthopedic Surgery

## 2024-02-16 ENCOUNTER — Encounter (HOSPITAL_COMMUNITY): Payer: Self-pay

## 2024-02-16 DIAGNOSIS — I700 Atherosclerosis of aorta: Secondary | ICD-10-CM | POA: Insufficient documentation

## 2024-02-16 DIAGNOSIS — Z01812 Encounter for preprocedural laboratory examination: Secondary | ICD-10-CM | POA: Insufficient documentation

## 2024-02-16 DIAGNOSIS — I252 Old myocardial infarction: Secondary | ICD-10-CM | POA: Insufficient documentation

## 2024-02-16 DIAGNOSIS — I2510 Atherosclerotic heart disease of native coronary artery without angina pectoris: Secondary | ICD-10-CM | POA: Diagnosis not present

## 2024-02-16 DIAGNOSIS — G4733 Obstructive sleep apnea (adult) (pediatric): Secondary | ICD-10-CM | POA: Insufficient documentation

## 2024-02-16 DIAGNOSIS — I4510 Unspecified right bundle-branch block: Secondary | ICD-10-CM | POA: Diagnosis not present

## 2024-02-16 DIAGNOSIS — I493 Ventricular premature depolarization: Secondary | ICD-10-CM | POA: Diagnosis not present

## 2024-02-16 DIAGNOSIS — M1712 Unilateral primary osteoarthritis, left knee: Secondary | ICD-10-CM | POA: Diagnosis not present

## 2024-02-16 DIAGNOSIS — D8689 Sarcoidosis of other sites: Secondary | ICD-10-CM | POA: Diagnosis not present

## 2024-02-16 DIAGNOSIS — I10 Essential (primary) hypertension: Secondary | ICD-10-CM | POA: Diagnosis not present

## 2024-02-16 DIAGNOSIS — M5135 Other intervertebral disc degeneration, thoracolumbar region: Secondary | ICD-10-CM | POA: Diagnosis not present

## 2024-02-16 DIAGNOSIS — Z01818 Encounter for other preprocedural examination: Secondary | ICD-10-CM

## 2024-02-16 DIAGNOSIS — J45909 Unspecified asthma, uncomplicated: Secondary | ICD-10-CM | POA: Diagnosis not present

## 2024-02-16 DIAGNOSIS — Z955 Presence of coronary angioplasty implant and graft: Secondary | ICD-10-CM | POA: Diagnosis not present

## 2024-02-16 HISTORY — DX: Unspecified osteoarthritis, unspecified site: M19.90

## 2024-02-16 HISTORY — DX: Unspecified malignant neoplasm of skin, unspecified: C44.90

## 2024-02-16 HISTORY — DX: Acute myocardial infarction, unspecified: I21.9

## 2024-02-16 HISTORY — DX: Personal history of urinary calculi: Z87.442

## 2024-02-16 HISTORY — DX: Gastro-esophageal reflux disease without esophagitis: K21.9

## 2024-02-16 LAB — SURGICAL PCR SCREEN
MRSA, PCR: NEGATIVE
Staphylococcus aureus: NEGATIVE

## 2024-02-16 NOTE — Progress Notes (Addendum)
 Case: [id number] Date/Time: 02/20/24 0805   Procedure: ARTHROPLASTY, KNEE, TOTAL (Left: Knee)   Anesthesia type: Choice   Diagnosis: Primary osteoarthritis of left knee [M17.12]   Pre-op  diagnosis: Left knee osteoarthritis   Location: [facility] ROOM 10 / [facility]   Surgeons: Melodi Lerner, MD       DISCUSSION: Hannah Vasquez is a 73 yo female who presents to PAT prior to surgery above. PMH of HTN, hx of MI (2018), CAD s/p PCI to LAD (2018), carotid artery stenosis, asthma, sarcoidosis of the liver, OSA (uses CPAP), esophageal stricture s/p dilation, arthritis.  Patient with hx of STEMI in 2018 leading to PCI of the LAD. She had ischemic cardiomyopathy at the time which has resolved on last Echo in 2021. Seen by Dr. Jeffrie on 01/30/24. She denied any cardiac symptoms. Per Dr. Jeffrie: Proceed with surgery with low overall cardiac risk.  She may hold her aspirin  for 5 days prior to surgery.  Patient evaluated in PAT due to mild asthma exacerbation diagnosed 6/24 at Urgent Care. CXR obtained on 6/24 was negative. On exam patient appears well. She is alert, oriented. Heart is regular rate and rhythm. Lungs are clear to auscultation with normal respiratory effort. Patient reports her last day of symptoms was 6/26 which is 11 days from surgery. Discussed with patient and her daughter usually we would like patient to be 2 weeks asymptomatic. They state they have an appointment with Pulmonology on 7/1. Will defer to Pulm on clearance.  Patient seen by Pulm at [facility] on 7/1 who she follows for moderate asthma and OSA. Per Dr. Parris: Asthma is well-managed with no symptoms of chest tightness, wheezing, coughing, or phlegm production. Last pulmonary function test in January/February 2025 showed FEV1 of 1.97 liters, indicating 100% in a nonobstructive pattern. She was cleared for surgery as low risk. From a pulmonary standpoint, there are no absolute contraindications in proceeding with elective surgery. Aggressive  pulmonary hygiene recommended in post op phase.  VS:  Wt Readings from Last 3 Encounters:  01/30/24 72.6 kg  01/17/24 72.6 kg  09/21/23 72.6 kg   Temp Readings from Last 3 Encounters:  02/07/24 37.1 C  01/16/24 36.6 C (Temporal)  09/21/23 36.7 C   BP Readings from Last 3 Encounters:  02/07/24 136/81  01/30/24 (!) 172/90  01/17/24 130/60   Pulse Readings from Last 3 Encounters:  02/07/24 95  01/30/24 85  01/17/24 60     PROVIDERS: Valora Agent, MD Cardiologist - Oneil Jeffrie, MD Pulmonologist - Halina Parris, MD  LABS: Labs reviewed: Acceptable for surgery. Mild leukocytosis, likely from recent steroid use. (all labs ordered are listed, but only abnormal results are displayed)  Labs Reviewed - No data to display   IMAGES: CXR 02/07/24:  FINDINGS: Biapical pleural thickening. No focal airspace consolidation, pleural effusion, or pneumothorax. No cardiomegaly. Aortic atherosclerosis. No acute fracture or destructive lesion. Multilevel degenerative disc disease of the spine.   IMPRESSION: No acute cardiopulmonary abnormality.  EKG 01/30/24:  Sinus rhythm with occasional Premature ventricular complexes Right bundle branch block  CV:  Stress test 01/24/2020:  The left ventricular ejection fraction is hyperdynamic (>65%). Nuclear stress EF: 77%. There was no ST segment deviation noted during stress. The study is normal. This is a low risk study.   Low risk stress nuclear study with normal perfusion and normal left ventricular regional and global systolic function.  Echo 01/24/2020:  IMPRESSIONS    1. Left ventricular ejection fraction, by estimation, is 60 to 65%. The  left ventricle has normal function. The left ventricle has no regional wall motion abnormalities. Left ventricular diastolic parameters were normal.  2. Right ventricular systolic function is normal. The right ventricular size is normal.  3. The mitral valve is normal in structure.  Trivial mitral valve regurgitation. No evidence of mitral stenosis.  4. The aortic valve is tricuspid. Aortic valve regurgitation is not visualized. No aortic stenosis is present.  Past Medical History:  Diagnosis Date   Advanced hepatic fibrosis    Allergy    Anxiety    CAD (coronary artery disease)    Cataract    Chronic systolic heart failure (HCC)    DDD (degenerative disc disease), thoracolumbar    Does use hearing aid    bilateral as of 10/05/2019   Elevated alkaline phosphatase level    Gallstones    Hemorrhoids    Hepatic steatosis    History of MI (myocardial infarction) 10/07/2017   Hypertension    Mitral valve prolapse    NASH (nonalcoholic steatohepatitis)    OSA (obstructive sleep apnea)    Osteoporosis    RBBB    Sarcoidosis    Status post dilation of esophageal narrowing     Past Surgical History:  Procedure Laterality Date   BASAL CELL CARCINOMA EXCISION  02/05/2016   Dr. Jadine, [facility]   BLADDER SURGERY  11/15/1995   bladder neck suspension, urinary incontinence   CARDIAC CATHETERIZATION  01/03/2017 DES LAD   CATARACT EXTRACTION Left 04/30/2016   Dr. Milan, [facility]   CORONARY STENT INTERVENTION N/A 01/03/2017   Procedure: Coronary Stent Intervention;  Surgeon: Mady Bruckner, MD;  Location: [facility]AS[facility] CV LAB;  Service: Cardiovascular;  Laterality: N/A;   CORONARY ULTRASOUND/IVUS N/A 01/03/2017   Procedure: Intravascular Ultrasound/IVUS;  Surgeon: Mady Bruckner, MD;  Location: [facility] INVASIVE CV LAB;  Service: Cardiovascular;  Laterality: N/A;   DENTAL SURGERY  06/20/2016   Tooth Implant; Dr. Ann   KNEE SURGERY  2003   torn meniscus   LEFT HEART CATH AND CORONARY ANGIOGRAPHY N/A 01/03/2017   Procedure: Left Heart Cath and Coronary Angiography;  Surgeon: Mady Bruckner, MD;  Location: [facility] INVASIVE CV LAB;  Service: Cardiovascular;  Laterality: N/A;   MOHS SURGERY Right 2017   right side of face   ROTATOR CUFF REPAIR      SHOULDER SURGERY Right 2005    MEDICATIONS:  acetaminophen  (TYLENOL ) 500 MG tablet   albuterol  (VENTOLIN  HFA) 108 (90 Base) MCG/ACT inhaler   amLODipine  (NORVASC ) 5 MG tablet   aspirin  EC 81 MG tablet   atorvastatin  (LIPITOR ) 80 MG tablet   Calcium -Magnesium-Vitamin D  (CALCIUM  1200+D3 PO)   Cyanocobalamin  (VITAMIN B12 PO)   fluticasone  (FLONASE ) 50 MCG/ACT nasal spray   furosemide  (LASIX ) 20 MG tablet   gabapentin  (NEURONTIN ) 300 MG capsule   isosorbide  mononitrate (IMDUR ) 30 MG 24 hr tablet   loratadine  (CLARITIN ) 10 MG tablet   losartan  (COZAAR ) 100 MG tablet   meloxicam  (MOBIC ) 15 MG tablet   metoprolol  succinate (TOPROL -XL) 25 MG 24 hr tablet   Multiple Vitamin (MULTIVITAMIN) tablet   nitroGLYCERIN  (NITROSTAT ) 0.4 MG SL tablet   Omega-3 Fatty Acids (FISH OIL ) 1200 MG CAPS   omeprazole  (PRILOSEC) 40 MG capsule   potassium chloride  SA (KLOR-CON  M) 20 MEQ tablet   spironolactone  (ALDACTONE ) 25 MG tablet   traMADol  (ULTRAM ) 50 MG tablet   triamcinolone  cream (KENALOG ) 0.1 %   vitamin E 180 MG (400 UNITS) capsule   No current facility-administered medications for this encounter.  Burnard CHRISTELLA Odis DEVONNA [facility] Short Stay/Anesthesiology [facility] Phone [phone] 02/16/2024 11:06 AM

## 2024-02-16 NOTE — Progress Notes (Addendum)
  Date of COVID positive in last 90 days:  No  PCP - Lynwood Null Cardiologist - Oneil Parchment, MD Pulmonologist - Halina Picking, MD  Cardiac clearance in Epic dated  01-30-24  Pulmonary clearance in Epic dated 02-14-24  Chest x-ray - 02-07-24 Epic EKG - 01-30-24 Epic Stress Test - 01-24-20 Epic ECHO - 01-24-20 Epic Cardiac Cath - 01-03-17 Epic Pacemaker/ICD device last checked: N/A Spinal Cord Stimulator:  N/A  Bowel Prep - N/A  Sleep Study - Yes, +sleep apnea CPAP - Yes  A1c 6.4 01-30-24 Fasting Blood Sugar -  Checks Blood Sugar _____ times a day  Last dose of GLP1 agonist-  N/A GLP1 instructions:  Do not take after     Last dose of SGLT-2 inhibitors-  N/A SGLT-2 instructions:  Do not take after    Blood Thinner Instructions:  Last dose:   Time: Aspirin  Instructions:  ASA 81 Last Dose:  02-14-24  Activity level:  Difficulty climbing stairs due to joint pain.  Able to perform activities of daily living without stopping and without symptoms of chest pain or shortness of breath.  Patient lives alone.   Anesthesia review:  Hx of MI, CAD, CHF, NASH, Sarcoidosis, OSA, HTN.  Recent URI, all symptoms resolved.  Patient denies shortness of breath, fever, cough and chest pain at PAT appointment  Patient verbalized understanding of instructions that were given to them at the PAT appointment. Patient was also instructed that they will need to review over the PAT instructions again at home before surgery.

## 2024-02-16 NOTE — Anesthesia Preprocedure Evaluation (Addendum)
 Anesthesia Evaluation  Patient identified by MRN, date of birth, ID band Patient awake    Reviewed: Allergy & Precautions, NPO status , Patient's Chart, lab work & pertinent test results  Airway Mallampati: III  TM Distance: >3 FB Neck ROM: Full    Dental  (+) Dental Advisory Given   Pulmonary sleep apnea and Continuous Positive Airway Pressure Ventilation    breath sounds clear to auscultation       Cardiovascular hypertension, Pt. on medications and Pt. on home beta blockers + CAD and + Past MI  + dysrhythmias  Rhythm:Regular Rate:Normal     Neuro/Psych  Neuromuscular disease    GI/Hepatic ,GERD  ,,(+) Hepatitis - (NASH)  Endo/Other  negative endocrine ROS    Renal/GU negative Renal ROS     Musculoskeletal  (+) Arthritis ,    Abdominal   Peds  Hematology negative hematology ROS (+)   Anesthesia Other Findings   Reproductive/Obstetrics                              Anesthesia Physical Anesthesia Plan  ASA: 3  Anesthesia Plan: General   Post-op Pain Management: Ofirmev  IV (intra-op)* and Regional block*   Induction:   PONV Risk Score and Plan: 3 and Propofol  infusion, Dexamethasone , Ondansetron  and Treatment may vary due to age or medical condition  Airway Management Planned: Natural Airway and Simple Face Mask  Additional Equipment:   Intra-op Plan:   Post-operative Plan:   Informed Consent: I have reviewed the patients History and Physical, chart, labs and discussed the procedure including the risks, benefits and alternatives for the proposed anesthesia with the patient or authorized representative who has indicated his/her understanding and acceptance.       Plan Discussed with: CRNA  Anesthesia Plan Comments:          Anesthesia Quick Evaluation

## 2024-02-16 NOTE — Patient Instructions (Addendum)
 SURGICAL WAITING ROOM VISITATION Patients having surgery or a procedure may have no more than 2 support people in the waiting area - these visitors may rotate.    Children under the age of 49 must have an adult with them who is not the patient.  If the patient needs to stay at the hospital during part of their recovery, the visitor guidelines for inpatient rooms apply. Pre-op  nurse will coordinate an appropriate time for 1 support person to accompany patient in pre-op .  This support person may not rotate.    Please refer to the [facility] website for the visitor guidelines for Inpatients (after your surgery is over and you are in a regular room).       Your procedure is scheduled on: 02-20-24   Report to [facility] Main Entrance    Report to admitting at 5:50 AM   Call this number if you have problems the morning of surgery [phone]   Do not eat food :After Midnight.   After Midnight you may have the following liquids until 5:20 AM DAY OF SURGERY  Water Non-Citrus Juices (without pulp, NO RED-Apple, White grape, White cranberry) Black Coffee (NO MILK/CREAM OR CREAMERS, sugar ok)  Clear Tea (NO MILK/CREAM OR CREAMERS, sugar ok) regular and decaf                             Plain Jell-O (NO RED)                                           Fruit ices (not with fruit pulp, NO RED)                                     Popsicles (NO RED)                                                               Sports drinks like Gatorade (NO RED)                   The day of surgery:  Drink ONE (1) Pre-Surgery  G2 by 5:20 AM the morning of surgery. Drink in one sitting. Do not sip.  This drink was given to you during your hospital  pre-op  appointment visit. Nothing else to drink after completing the Pre-Surgery  G2.          If you have questions, please contact your surgeon's office.   FOLLOW  ANY ADDITIONAL PRE OP INSTRUCTIONS YOU RECEIVED FROM YOUR SURGEON'S OFFICE!!!      Oral Hygiene is also important to reduce your risk of infection.                                    Remember - BRUSH YOUR TEETH THE MORNING OF SURGERY WITH YOUR REGULAR TOOTHPASTE   Do NOT smoke after Midnight   Take these medicines the morning of surgery with A SIP OF WATER:    Amlodipine    Gabapentin   Isosorbide    Claritin    Metoprolol    Omeprazole    Okay to use inhalers and nasal spray  Stop all vitamins and herbal supplements 7 days before surgery  Bring CPAP mask and tubing day of surgery.                              You may not have any metal on your body including hair pins, jewelry, and body piercing             Do not wear make-up, lotions, powders, perfumes or deodorant  Do not wear nail polish including gel and S&S, artificial/acrylic nails, or any other type of covering on natural nails including finger and toenails. If you have artificial nails, gel coating, etc. that needs to be removed by a nail salon please have this removed prior to surgery or surgery may need to be canceled/ delayed if the surgeon/ anesthesia feels like they are unable to be safely monitored.   Do not shave  48 hours prior to surgery.           Do not bring valuables to the hospital. [facility] IS NOT RESPONSIBLE   FOR VALUABLES.   Contacts, dentures or bridgework may not be worn into surgery.   Bring small overnight bag day of surgery.   DO NOT BRING YOUR HOME MEDICATIONS TO THE HOSPITAL. PHARMACY WILL DISPENSE MEDICATIONS LISTED ON YOUR MEDICATION LIST TO YOU DURING YOUR ADMISSION IN THE HOSPITAL!     Special Instructions: Bring a copy of your healthcare power of attorney and living will documents the day of surgery if you haven't scanned them before.              Please read over the following fact sheets you were given: IF YOU HAVE QUESTIONS ABOUT YOUR PRE-OP  INSTRUCTIONS PLEASE CALL [phone] Gwen   If you received a COVID test during your pre-op  visit  it is requested that you  wear a mask when out in public, stay away from anyone that may not be feeling well and notify your surgeon if you develop symptoms. If you test positive for Covid or have been in contact with anyone that has tested positive in the last 10 days please notify you surgeon.    Pre-operative 5 CHG Bath Instructions   You can play a key role in reducing the risk of infection after surgery. Your skin needs to be as free of germs as possible. You can reduce the number of germs on your skin by washing with CHG (chlorhexidine gluconate) soap before surgery. CHG is an antiseptic soap that kills germs and continues to kill germs even after washing.   DO NOT use if you have an allergy to chlorhexidine/CHG or antibacterial soaps. If your skin becomes reddened or irritated, stop using the CHG and notify one of our RNs at [phone].   Please shower with the CHG soap starting 4 days before surgery using the following schedule:     Please keep in mind the following:  DO NOT shave, including legs and underarms, starting the day of your first shower.   You may shave your face at any point before/day of surgery.  Place clean sheets on your bed the day you start using CHG soap. Use a clean washcloth (not used since being washed) for each shower. DO NOT sleep with pets once you start using the CHG.   CHG Shower Instructions:  If you choose  to wash your hair and private area, wash first with your normal shampoo/soap.  After you use shampoo/soap, rinse your hair and body thoroughly to remove shampoo/soap residue.  Turn the water OFF and apply about 3 tablespoons (45 ml) of CHG soap to a CLEAN washcloth.  Apply CHG soap ONLY FROM YOUR NECK DOWN TO YOUR TOES (washing for 3-5 minutes)  DO NOT use CHG soap on face, private areas, open wounds, or sores.  Pay special attention to the area where your surgery is being performed.  If you are having back surgery, having someone wash your back for you may be  helpful. Wait 2 minutes after CHG soap is applied, then you may rinse off the CHG soap.  Pat dry with a clean towel  Put on clean clothes/pajamas   If you choose to wear lotion, please use ONLY the CHG-compatible lotions on the back of this paper.     Additional instructions for the day of surgery: DO NOT APPLY any lotions, deodorants, cologne, or perfumes.   Put on clean/comfortable clothes.  Brush your teeth.  Ask your nurse before applying any prescription medications to the skin.      CHG Compatible Lotions   Aveeno Moisturizing lotion  Cetaphil Moisturizing Cream  Cetaphil Moisturizing Lotion  Clairol Herbal Essence Moisturizing Lotion, Dry Skin  Clairol Herbal Essence Moisturizing Lotion, Extra Dry Skin  Clairol Herbal Essence Moisturizing Lotion, Normal Skin  Curel Age Defying Therapeutic Moisturizing Lotion with Alpha Hydroxy  Curel Extreme Care Body Lotion  Curel Soothing Hands Moisturizing Hand Lotion  Curel Therapeutic Moisturizing Cream, Fragrance-Free  Curel Therapeutic Moisturizing Lotion, Fragrance-Free  Curel Therapeutic Moisturizing Lotion, Original Formula  Eucerin Daily Replenishing Lotion  Eucerin Dry Skin Therapy Plus Alpha Hydroxy Crme  Eucerin Dry Skin Therapy Plus Alpha Hydroxy Lotion  Eucerin Original Crme  Eucerin Original Lotion  Eucerin Plus Crme Eucerin Plus Lotion  Eucerin TriLipid Replenishing Lotion  Keri Anti-Bacterial Hand Lotion  Keri Deep Conditioning Original Lotion Dry Skin Formula Softly Scented  Keri Deep Conditioning Original Lotion, Fragrance Free Sensitive Skin Formula  Keri Lotion Fast Absorbing Fragrance Free Sensitive Skin Formula  Keri Lotion Fast Absorbing Softly Scented Dry Skin Formula  Keri Original Lotion  Keri Skin Renewal Lotion Keri Silky Smooth Lotion  Keri Silky Smooth Sensitive Skin Lotion  Nivea Body Creamy Conditioning Oil  Nivea Body Extra Enriched Lotion  Nivea Body Original Lotion  Nivea Body Sheer  Moisturizing Lotion Nivea Crme  Nivea Skin Firming Lotion  NutraDerm 30 Skin Lotion  NutraDerm Skin Lotion  NutraDerm Therapeutic Skin Cream  NutraDerm Therapeutic Skin Lotion  ProShield Protective Hand Cream  Provon moisturizing lotion   PATIENT SIGNATURE_________________________________  NURSE SIGNATURE__________________________________  ________________________________________________________________________    Hannah Vasquez  An incentive spirometer is a tool that can help keep your lungs clear and active. This tool measures how well you are filling your lungs with each breath. Taking long deep breaths may help reverse or decrease the chance of developing breathing (pulmonary) problems (especially infection) following: A long period of time when you are unable to move or be active. BEFORE THE PROCEDURE  If the spirometer includes an indicator to show your best effort, your nurse or respiratory therapist will set it to a desired goal. If possible, sit up straight or lean slightly forward. Try not to slouch. Hold the incentive spirometer in an upright position. INSTRUCTIONS FOR USE  Sit on the edge of your bed if possible, or sit up as far as you can  in bed or on a chair. Hold the incentive spirometer in an upright position. Breathe out normally. Place the mouthpiece in your mouth and seal your lips tightly around it. Breathe in slowly and as deeply as possible, raising the piston or the ball toward the top of the column. Hold your breath for 3-5 seconds or for as long as possible. Allow the piston or ball to fall to the bottom of the column. Remove the mouthpiece from your mouth and breathe out normally. Rest for a few seconds and repeat Steps 1 through 7 at least 10 times every 1-2 hours when you are awake. Take your time and take a few normal breaths between deep breaths. The spirometer may include an indicator to show your best effort. Use the indicator as a goal to work  toward during each repetition. After each set of 10 deep breaths, practice coughing to be sure your lungs are clear. If you have an incision (the cut made at the time of surgery), support your incision when coughing by placing a pillow or rolled up towels firmly against it. Once you are able to get out of bed, walk around indoors and cough well. You may stop using the incentive spirometer when instructed by your caregiver.  RISKS AND COMPLICATIONS Take your time so you do not get dizzy or light-headed. If you are in pain, you may need to take or ask for pain medication before doing incentive spirometry. It is harder to take a deep breath if you are having pain. AFTER USE Rest and breathe slowly and easily. It can be helpful to keep track of a log of your progress. Your caregiver can provide you with a simple table to help with this. If you are using the spirometer at home, follow these instructions: SEEK MEDICAL CARE IF:  You are having difficultly using the spirometer. You have trouble using the spirometer as often as instructed. Your pain medication is not giving enough relief while using the spirometer. You develop fever of 100.5 F (38.1 C) or higher. SEEK IMMEDIATE MEDICAL CARE IF:  You cough up bloody sputum that had not been present before. You develop fever of 102 F (38.9 C) or greater. You develop worsening pain at or near the incision site. MAKE SURE YOU:  Understand these instructions. Will watch your condition. Will get help right away if you are not doing well or get worse. Document Released: 12/13/2006 Document Revised: 10/25/2011 Document Reviewed: 02/13/2007 [facility] Patient Information 2014 [place], [place].   ________________________________________________________________________

## 2024-02-20 ENCOUNTER — Ambulatory Visit (HOSPITAL_COMMUNITY): Payer: Self-pay | Admitting: Physician Assistant

## 2024-02-20 ENCOUNTER — Ambulatory Visit (HOSPITAL_BASED_OUTPATIENT_CLINIC_OR_DEPARTMENT_OTHER): Admitting: Anesthesiology

## 2024-02-20 ENCOUNTER — Other Ambulatory Visit: Payer: Self-pay

## 2024-02-20 ENCOUNTER — Other Ambulatory Visit (HOSPITAL_COMMUNITY): Payer: Self-pay

## 2024-02-20 ENCOUNTER — Encounter (HOSPITAL_COMMUNITY): Payer: Self-pay | Admitting: Orthopedic Surgery

## 2024-02-20 ENCOUNTER — Ambulatory Visit (HOSPITAL_COMMUNITY)
Admission: RE | Admit: 2024-02-20 | Discharge: 2024-02-21 | Disposition: A | Discharge status: Home / Self Care | Attending: Orthopedic Surgery | Admitting: Orthopedic Surgery

## 2024-02-20 ENCOUNTER — Telehealth (HOSPITAL_COMMUNITY): Payer: Self-pay | Admitting: Pharmacy Technician

## 2024-02-20 ENCOUNTER — Encounter (HOSPITAL_COMMUNITY)
Admission: RE | Disposition: A | Discharge status: Home / Self Care | Payer: Self-pay | Source: Home / Self Care | Attending: Orthopedic Surgery

## 2024-02-20 DIAGNOSIS — I110 Hypertensive heart disease with heart failure: Secondary | ICD-10-CM | POA: Diagnosis not present

## 2024-02-20 DIAGNOSIS — I5022 Chronic systolic (congestive) heart failure: Secondary | ICD-10-CM | POA: Insufficient documentation

## 2024-02-20 DIAGNOSIS — I252 Old myocardial infarction: Secondary | ICD-10-CM | POA: Insufficient documentation

## 2024-02-20 DIAGNOSIS — I2510 Atherosclerotic heart disease of native coronary artery without angina pectoris: Secondary | ICD-10-CM

## 2024-02-20 DIAGNOSIS — Z7901 Long term (current) use of anticoagulants: Secondary | ICD-10-CM | POA: Insufficient documentation

## 2024-02-20 DIAGNOSIS — M179 Osteoarthritis of knee, unspecified: Secondary | ICD-10-CM | POA: Diagnosis present

## 2024-02-20 DIAGNOSIS — M1710 Unilateral primary osteoarthritis, unspecified knee: Secondary | ICD-10-CM

## 2024-02-20 DIAGNOSIS — K7581 Nonalcoholic steatohepatitis (NASH): Secondary | ICD-10-CM | POA: Insufficient documentation

## 2024-02-20 DIAGNOSIS — K219 Gastro-esophageal reflux disease without esophagitis: Secondary | ICD-10-CM | POA: Insufficient documentation

## 2024-02-20 DIAGNOSIS — M1712 Unilateral primary osteoarthritis, left knee: Secondary | ICD-10-CM | POA: Insufficient documentation

## 2024-02-20 DIAGNOSIS — I4510 Unspecified right bundle-branch block: Secondary | ICD-10-CM | POA: Diagnosis not present

## 2024-02-20 DIAGNOSIS — M25762 Osteophyte, left knee: Secondary | ICD-10-CM | POA: Diagnosis not present

## 2024-02-20 DIAGNOSIS — G8918 Other acute postprocedural pain: Secondary | ICD-10-CM | POA: Diagnosis not present

## 2024-02-20 HISTORY — PX: TOTAL KNEE ARTHROPLASTY: SHX125

## 2024-02-20 SURGERY — ARTHROPLASTY, KNEE, TOTAL
Anesthesia: General | Site: Knee | Laterality: Left

## 2024-02-20 MED ORDER — FENTANYL CITRATE PF 50 MCG/ML IJ SOSY: 25.0000 ug | PREFILLED_SYRINGE | INTRAMUSCULAR | Status: DC | PRN

## 2024-02-20 MED ORDER — PROPOFOL 1000 MG/100ML IV EMUL
INTRAVENOUS | Status: AC
  Filled 2024-02-20: qty 100

## 2024-02-20 MED ORDER — STERILE WATER FOR IRRIGATION IR SOLN
Status: DC | PRN
  Administered 2024-02-20: 1000 mL

## 2024-02-20 MED ORDER — ACETAMINOPHEN 10 MG/ML IV SOLN
1000.0000 mg | Freq: Four times a day (QID) | INTRAVENOUS | Status: DC
  Administered 2024-02-20: 1000 mg via INTRAVENOUS
  Filled 2024-02-20: qty 100

## 2024-02-20 MED ORDER — OXYCODONE HCL 5 MG PO TABS
5.0000 mg | ORAL_TABLET | ORAL | Status: DC | PRN
  Administered 2024-02-20 – 2024-02-21 (×5): 10 mg via ORAL
  Filled 2024-02-20 (×5): qty 2

## 2024-02-20 MED ORDER — METOCLOPRAMIDE HCL 5 MG/ML IJ SOLN: 5.0000 mg | Freq: Three times a day (TID) | INTRAMUSCULAR | Status: DC | PRN

## 2024-02-20 MED ORDER — MIDAZOLAM HCL 2 MG/2ML IJ SOLN: 1.0000 mg | INTRAMUSCULAR | Status: DC

## 2024-02-20 MED ORDER — AMISULPRIDE (ANTIEMETIC) 5 MG/2ML IV SOLN: 10.0000 mg | Freq: Once | INTRAVENOUS | Status: DC | PRN

## 2024-02-20 MED ORDER — TRANEXAMIC ACID-NACL 1000-0.7 MG/100ML-% IV SOLN
1000.0000 mg | INTRAVENOUS | Status: AC
  Administered 2024-02-20: 1000 mg via INTRAVENOUS
  Filled 2024-02-20: qty 100

## 2024-02-20 MED ORDER — CLONIDINE HCL (ANALGESIA) 100 MCG/ML EP SOLN
EPIDURAL | Status: DC | PRN
Start: 2024-02-20 — End: 2024-02-20
  Administered 2024-02-20: 50 ug

## 2024-02-20 MED ORDER — ACETAMINOPHEN 500 MG PO TABS: 1000.0000 mg | ORAL_TABLET | Freq: Four times a day (QID) | ORAL | Status: DC

## 2024-02-20 MED ORDER — CEFAZOLIN SODIUM-DEXTROSE 2-4 GM/100ML-% IV SOLN
2.0000 g | Freq: Three times a day (TID) | INTRAVENOUS | Status: AC
  Administered 2024-02-20 (×2): 2 g via INTRAVENOUS
  Filled 2024-02-20 (×2): qty 100

## 2024-02-20 MED ORDER — ASPIRIN 81 MG PO TBEC: 81.0000 mg | DELAYED_RELEASE_TABLET | Freq: Two times a day (BID) | ORAL | Status: DC

## 2024-02-20 MED ORDER — GABAPENTIN 300 MG PO CAPS
600.0000 mg | ORAL_CAPSULE | Freq: Every day | ORAL | Status: DC
  Administered 2024-02-20: 600 mg via ORAL
  Filled 2024-02-20: qty 2

## 2024-02-20 MED ORDER — FUROSEMIDE 20 MG PO TABS: 20.0000 mg | ORAL_TABLET | Freq: Every day | ORAL | Status: DC | PRN

## 2024-02-20 MED ORDER — MIDAZOLAM HCL 2 MG/2ML IJ SOLN
INTRAMUSCULAR | Status: AC
  Filled 2024-02-20: qty 2

## 2024-02-20 MED ORDER — DOCUSATE SODIUM 100 MG PO CAPS
100.0000 mg | ORAL_CAPSULE | Freq: Two times a day (BID) | ORAL | Status: DC
  Administered 2024-02-20 – 2024-02-21 (×3): 100 mg via ORAL
  Filled 2024-02-20 (×3): qty 1

## 2024-02-20 MED ORDER — CEFAZOLIN SODIUM-DEXTROSE 2-4 GM/100ML-% IV SOLN
2.0000 g | INTRAVENOUS | Status: AC
  Administered 2024-02-20: 2 g via INTRAVENOUS
  Filled 2024-02-20: qty 100

## 2024-02-20 MED ORDER — SODIUM CHLORIDE (PF) 0.9 % IJ SOLN
INTRAMUSCULAR | Status: DC | PRN
  Administered 2024-02-20: 80 mL

## 2024-02-20 MED ORDER — POVIDONE-IODINE 10 % EX SWAB: 2.0000 | Freq: Once | CUTANEOUS | Status: AC

## 2024-02-20 MED ORDER — PANTOPRAZOLE SODIUM 40 MG PO TBEC
80.0000 mg | DELAYED_RELEASE_TABLET | Freq: Every day | ORAL | Status: DC
  Administered 2024-02-21: 80 mg via ORAL
  Filled 2024-02-20: qty 2

## 2024-02-20 MED ORDER — DIPHENHYDRAMINE HCL 12.5 MG/5ML PO ELIX: 12.5000 mg | ORAL_SOLUTION | ORAL | Status: DC | PRN

## 2024-02-20 MED ORDER — PHENYLEPHRINE HCL-NACL 20-0.9 MG/250ML-% IV SOLN
INTRAVENOUS | Status: AC
  Filled 2024-02-20: qty 250

## 2024-02-20 MED ORDER — FENTANYL CITRATE PF 50 MCG/ML IJ SOSY
50.0000 ug | PREFILLED_SYRINGE | INTRAMUSCULAR | Status: DC
  Administered 2024-02-20: 50 ug via INTRAVENOUS
  Filled 2024-02-20: qty 2

## 2024-02-20 MED ORDER — ROPIVACAINE HCL 5 MG/ML IJ SOLN
INTRAMUSCULAR | Status: DC | PRN
  Administered 2024-02-20: 20 mL via PERINEURAL

## 2024-02-20 MED ORDER — MENTHOL 3 MG MT LOZG: 1.0000 | LOZENGE | OROMUCOSAL | Status: DC | PRN

## 2024-02-20 MED ORDER — ONDANSETRON HCL 4 MG/2ML IJ SOLN: 4.0000 mg | Freq: Four times a day (QID) | INTRAMUSCULAR | Status: DC | PRN

## 2024-02-20 MED ORDER — SODIUM CHLORIDE (PF) 0.9 % IJ SOLN
INTRAMUSCULAR | Status: AC
  Filled 2024-02-20: qty 10

## 2024-02-20 MED ORDER — POTASSIUM CHLORIDE CRYS ER 20 MEQ PO TBCR
40.0000 meq | EXTENDED_RELEASE_TABLET | Freq: Every day | ORAL | Status: DC
  Administered 2024-02-21: 40 meq via ORAL
  Filled 2024-02-20: qty 2

## 2024-02-20 MED ORDER — DEXAMETHASONE SODIUM PHOSPHATE 10 MG/ML IJ SOLN
8.0000 mg | Freq: Once | INTRAMUSCULAR | Status: AC
  Administered 2024-02-20: 10 mg via INTRAVENOUS

## 2024-02-20 MED ORDER — METHOCARBAMOL 500 MG PO TABS
500.0000 mg | ORAL_TABLET | Freq: Four times a day (QID) | ORAL | Status: DC | PRN
  Administered 2024-02-21 (×2): 500 mg via ORAL
  Filled 2024-02-20 (×2): qty 1

## 2024-02-20 MED ORDER — METHOCARBAMOL 1000 MG/10ML IJ SOLN: 500.0000 mg | Freq: Four times a day (QID) | INTRAMUSCULAR | Status: DC | PRN

## 2024-02-20 MED ORDER — NITROGLYCERIN 0.4 MG SL SUBL: 0.4000 mg | SUBLINGUAL_TABLET | SUBLINGUAL | Status: DC | PRN

## 2024-02-20 MED ORDER — METOCLOPRAMIDE HCL 5 MG PO TABS: 5.0000 mg | ORAL_TABLET | Freq: Three times a day (TID) | ORAL | Status: DC | PRN

## 2024-02-20 MED ORDER — ATORVASTATIN CALCIUM 10 MG PO TABS
10.0000 mg | ORAL_TABLET | Freq: Every evening | ORAL | Status: DC
  Administered 2024-02-20: 10 mg via ORAL
  Filled 2024-02-20: qty 1

## 2024-02-20 MED ORDER — DEXAMETHASONE SODIUM PHOSPHATE 10 MG/ML IJ SOLN
10.0000 mg | Freq: Once | INTRAMUSCULAR | Status: AC
  Administered 2024-02-21: 10 mg via INTRAVENOUS
  Filled 2024-02-20: qty 1

## 2024-02-20 MED ORDER — POLYETHYLENE GLYCOL 3350 17 G PO PACK: 17.0000 g | PACK | Freq: Every day | ORAL | Status: DC | PRN

## 2024-02-20 MED ORDER — ISOSORBIDE MONONITRATE ER 30 MG PO TB24
30.0000 mg | ORAL_TABLET | Freq: Every day | ORAL | Status: DC
  Administered 2024-02-20: 30 mg via ORAL
  Filled 2024-02-20: qty 1

## 2024-02-20 MED ORDER — ONDANSETRON HCL 4 MG/2ML IJ SOLN
INTRAMUSCULAR | Status: AC
Start: 2024-02-20 — End: 2024-02-20
  Filled 2024-02-20: qty 2

## 2024-02-20 MED ORDER — ALBUTEROL SULFATE (2.5 MG/3ML) 0.083% IN NEBU: 2.5000 mg | INHALATION_SOLUTION | RESPIRATORY_TRACT | Status: DC | PRN

## 2024-02-20 MED ORDER — AMLODIPINE BESYLATE 5 MG PO TABS
5.0000 mg | ORAL_TABLET | Freq: Every day | ORAL | Status: DC
  Administered 2024-02-21: 5 mg via ORAL
  Filled 2024-02-20: qty 1

## 2024-02-20 MED ORDER — SPIRONOLACTONE 25 MG PO TABS
25.0000 mg | ORAL_TABLET | Freq: Every day | ORAL | Status: DC
  Administered 2024-02-20 – 2024-02-21 (×2): 25 mg via ORAL
  Filled 2024-02-20 (×2): qty 1

## 2024-02-20 MED ORDER — LACTATED RINGERS IV SOLN: INTRAVENOUS | Status: DC

## 2024-02-20 MED ORDER — HYDROMORPHONE HCL 1 MG/ML IJ SOLN
1.0000 mg | INTRAMUSCULAR | Status: DC | PRN
  Administered 2024-02-20: 1 mg via INTRAVENOUS
  Filled 2024-02-20: qty 1

## 2024-02-20 MED ORDER — BISACODYL 10 MG RE SUPP: 10.0000 mg | Freq: Every day | RECTAL | Status: DC | PRN

## 2024-02-20 MED ORDER — PROPOFOL 1000 MG/100ML IV EMUL
INTRAVENOUS | Status: AC
Start: 2024-02-20 — End: 2024-02-20
  Filled 2024-02-20: qty 100

## 2024-02-20 MED ORDER — FLUTICASONE PROPIONATE 50 MCG/ACT NA SUSP: 1.0000 | Freq: Every day | NASAL | Status: DC | PRN

## 2024-02-20 MED ORDER — BUPIVACAINE LIPOSOME 1.3 % IJ SUSP: 20.0000 mL | Freq: Once | INTRAMUSCULAR | Status: AC

## 2024-02-20 MED ORDER — FLEET ENEMA RE ENEM: 1.0000 | ENEMA | Freq: Once | RECTAL | Status: DC | PRN

## 2024-02-20 MED ORDER — ONDANSETRON HCL 4 MG PO TABS: 4.0000 mg | ORAL_TABLET | Freq: Four times a day (QID) | ORAL | Status: DC | PRN

## 2024-02-20 MED ORDER — ORAL CARE MOUTH RINSE: 15.0000 mL | Freq: Once | OROMUCOSAL | Status: AC

## 2024-02-20 MED ORDER — PHENOL 1.4 % MT LIQD: 1.0000 | OROMUCOSAL | Status: DC | PRN

## 2024-02-20 MED ORDER — SODIUM CHLORIDE 0.9 % IR SOLN
Status: DC | PRN
  Administered 2024-02-20: 1000 mL

## 2024-02-20 MED ORDER — GABAPENTIN 300 MG PO CAPS
300.0000 mg | ORAL_CAPSULE | Freq: Two times a day (BID) | ORAL | Status: DC
  Administered 2024-02-20 – 2024-02-21 (×2): 300 mg via ORAL
  Filled 2024-02-20 (×2): qty 1

## 2024-02-20 MED ORDER — 0.9 % SODIUM CHLORIDE (POUR BTL) OPTIME
TOPICAL | Status: DC | PRN
  Administered 2024-02-20: 1000 mL

## 2024-02-20 MED ORDER — BUPIVACAINE LIPOSOME 1.3 % IJ SUSP
INTRAMUSCULAR | Status: AC
  Filled 2024-02-20: qty 20

## 2024-02-20 MED ORDER — LORATADINE 10 MG PO TABS
10.0000 mg | ORAL_TABLET | Freq: Every morning | ORAL | Status: DC
  Administered 2024-02-21: 10 mg via ORAL
  Filled 2024-02-20: qty 1

## 2024-02-20 MED ORDER — PROPOFOL 500 MG/50ML IV EMUL
INTRAVENOUS | Status: DC | PRN
  Administered 2024-02-20: 50 mg via INTRAVENOUS
  Administered 2024-02-20: 40 ug/kg/min via INTRAVENOUS

## 2024-02-20 MED ORDER — CHLORHEXIDINE GLUCONATE 0.12 % MT SOLN
15.0000 mL | Freq: Once | OROMUCOSAL | Status: AC
  Administered 2024-02-20: 15 mL via OROMUCOSAL

## 2024-02-20 MED ORDER — RIVAROXABAN 10 MG PO TABS
10.0000 mg | ORAL_TABLET | Freq: Every day | ORAL | Status: DC
  Administered 2024-02-21: 10 mg via ORAL
  Filled 2024-02-20: qty 1

## 2024-02-20 MED ORDER — GABAPENTIN 300 MG PO CAPS: 300.0000 mg | ORAL_CAPSULE | ORAL | Status: DC

## 2024-02-20 MED ORDER — ASPIRIN 81 MG PO TBEC
81.0000 mg | DELAYED_RELEASE_TABLET | Freq: Every morning | ORAL | Status: DC
Start: 2024-02-21 — End: 2024-02-20

## 2024-02-20 MED ORDER — ACETAMINOPHEN 325 MG PO TABS: 325.0000 mg | ORAL_TABLET | Freq: Four times a day (QID) | ORAL | Status: DC | PRN

## 2024-02-20 MED ORDER — SODIUM CHLORIDE (PF) 0.9 % IJ SOLN
INTRAMUSCULAR | Status: AC
  Filled 2024-02-20: qty 50

## 2024-02-20 MED ORDER — ALBUTEROL SULFATE HFA 108 (90 BASE) MCG/ACT IN AERS: 1.0000 | INHALATION_SPRAY | Freq: Four times a day (QID) | RESPIRATORY_TRACT | Status: DC | PRN

## 2024-02-20 MED ORDER — LOSARTAN POTASSIUM 50 MG PO TABS
100.0000 mg | ORAL_TABLET | Freq: Every day | ORAL | Status: DC
  Administered 2024-02-21: 100 mg via ORAL
  Filled 2024-02-20: qty 2

## 2024-02-20 MED ORDER — ACETAMINOPHEN 500 MG PO TABS
1000.0000 mg | ORAL_TABLET | Freq: Four times a day (QID) | ORAL | Status: DC
  Administered 2024-02-20 – 2024-02-21 (×3): 1000 mg via ORAL
  Filled 2024-02-20 (×3): qty 2

## 2024-02-20 MED ORDER — TRAMADOL HCL 50 MG PO TABS
50.0000 mg | ORAL_TABLET | Freq: Four times a day (QID) | ORAL | Status: DC | PRN
  Administered 2024-02-21 (×2): 50 mg via ORAL
  Filled 2024-02-20 (×2): qty 1

## 2024-02-20 MED ORDER — PROPOFOL 10 MG/ML IV BOLUS
INTRAVENOUS | Status: AC
  Filled 2024-02-20: qty 20

## 2024-02-20 MED ORDER — DEXAMETHASONE SODIUM PHOSPHATE 10 MG/ML IJ SOLN
INTRAMUSCULAR | Status: AC
  Filled 2024-02-20: qty 1

## 2024-02-20 MED ORDER — METOPROLOL SUCCINATE ER 25 MG PO TB24
25.0000 mg | ORAL_TABLET | Freq: Every day | ORAL | Status: DC
  Filled 2024-02-20: qty 1

## 2024-02-20 MED ORDER — BUPIVACAINE IN DEXTROSE 0.75-8.25 % IT SOLN
INTRATHECAL | Status: DC | PRN
Start: 2024-02-20 — End: 2024-02-20
  Administered 2024-02-20: 1.6 mL via INTRATHECAL

## 2024-02-20 MED ORDER — ONDANSETRON HCL 4 MG/2ML IJ SOLN
INTRAMUSCULAR | Status: DC | PRN
  Administered 2024-02-20: 4 mg via INTRAVENOUS

## 2024-02-20 MED ORDER — SODIUM CHLORIDE 0.9 % IV SOLN: INTRAVENOUS | Status: DC

## 2024-02-20 SURGICAL SUPPLY — 44 items
ATTUNE MED DOME PAT 38 KNEE (Knees) IMPLANT
ATTUNE PS FEM LT SZ 5 CEM KNEE (Femur) IMPLANT
ATTUNE PSRP INSR SZ 5 10M KNEE (Insert) IMPLANT
BAG COUNTER SPONGE SURGICOUNT (BAG) IMPLANT
BAG ZIPLOCK 12X15 (MISCELLANEOUS) ×1 IMPLANT
BASE TIBIAL ROT PLAT SZ 5 KNEE (Knees) IMPLANT
BLADE SAG 18X100X1.27 (BLADE) ×1 IMPLANT
BLADE SAW SGTL 11.0X1.19X90.0M (BLADE) ×1 IMPLANT
BNDG ELASTIC 4X5.8 VLCR NS LF (GAUZE/BANDAGES/DRESSINGS) IMPLANT
BNDG ELASTIC 6INX 5YD STR LF (GAUZE/BANDAGES/DRESSINGS) ×1 IMPLANT
BOWL SMART MIX CTS (DISPOSABLE) ×1 IMPLANT
CEMENT HV SMART SET (Cement) ×2 IMPLANT
COVER SURGICAL LIGHT HANDLE (MISCELLANEOUS) ×1 IMPLANT
CUFF TRNQT CYL 34X4.125X (TOURNIQUET CUFF) ×1 IMPLANT
DERMABOND ADVANCED .7 DNX12 (GAUZE/BANDAGES/DRESSINGS) ×1 IMPLANT
DRAPE U-SHAPE 47X51 STRL (DRAPES) ×1 IMPLANT
DRSG AQUACEL AG ADV 3.5X10 (GAUZE/BANDAGES/DRESSINGS) ×1 IMPLANT
DURAPREP 26ML APPLICATOR (WOUND CARE) ×1 IMPLANT
ELECT REM PT RETURN 15FT ADLT (MISCELLANEOUS) ×1 IMPLANT
GLOVE BIO SURGEON STRL SZ 6.5 (GLOVE) IMPLANT
GLOVE BIO SURGEON STRL SZ7 (GLOVE) IMPLANT
GLOVE BIO SURGEON STRL SZ8 (GLOVE) ×1 IMPLANT
GLOVE BIOGEL PI IND STRL 7.0 (GLOVE) ×1 IMPLANT
GLOVE BIOGEL PI IND STRL 8 (GLOVE) ×1 IMPLANT
GOWN STRL REUS W/ TWL LRG LVL3 (GOWN DISPOSABLE) ×1 IMPLANT
HOLDER FOLEY CATH W/STRAP (MISCELLANEOUS) ×1 IMPLANT
IMMOBILIZER KNEE 20 THIGH 36 (SOFTGOODS) ×1 IMPLANT
KIT TURNOVER KIT A (KITS) ×1 IMPLANT
MANIFOLD NEPTUNE II (INSTRUMENTS) ×1 IMPLANT
NS IRRIG 1000ML POUR BTL (IV SOLUTION) ×1 IMPLANT
PACK TOTAL KNEE CUSTOM (KITS) ×1 IMPLANT
PADDING CAST COTTON 6X4 STRL (CAST SUPPLIES) ×2 IMPLANT
PENCIL SMOKE EVACUATOR (MISCELLANEOUS) ×1 IMPLANT
PIN STEINMAN FIXATION KNEE (PIN) IMPLANT
PROTECTOR NERVE ULNAR (MISCELLANEOUS) ×1 IMPLANT
SET HNDPC FAN SPRY TIP SCT (DISPOSABLE) ×1 IMPLANT
SUT MNCRL AB 4-0 PS2 18 (SUTURE) ×1 IMPLANT
SUT VIC AB 2-0 CT1 TAPERPNT 27 (SUTURE) ×3 IMPLANT
SUTURE STRATFX 0 PDS 27 VIOLET (SUTURE) ×1 IMPLANT
TOWEL GREEN STERILE FF (TOWEL DISPOSABLE) ×1 IMPLANT
TRAY FOLEY MTR SLVR 16FR STAT (SET/KITS/TRAYS/PACK) ×1 IMPLANT
TUBE SUCTION HIGH CAP CLEAR NV (SUCTIONS) ×1 IMPLANT
WATER STERILE IRR 1000ML POUR (IV SOLUTION) ×2 IMPLANT
WRAP KNEE MAXI GEL POST OP (GAUZE/BANDAGES/DRESSINGS) ×1 IMPLANT

## 2024-02-20 NOTE — Telephone Encounter (Signed)
 Patient Product/process development scientist completed.    The patient is insured through [employer]/ [employer]. Patient has Medicare and is not eligible for a copay card, but may be able to apply for patient assistance or Medicare RX Payment Plan (Patient Must reach out to their plan, if eligible for payment plan), if available.    Ran test claim for Xarelto  10 mg and the current 30 day co-pay is $47.00.   This test claim was processed through [facility]- copay amounts may vary at other pharmacies due to pharmacy/plan contracts, or as the patient moves through the different stages of their insurance plan.     Reyes Sharps, CPHT Pharmacy Technician III Certified Patient Advocate [facility] Patient Advocate Team Direct Number: [phone]  Fax: [phone]

## 2024-02-20 NOTE — Discharge Instructions (Addendum)
 Hannah Moan, MD Total Joint Specialist [facility] [address] [place], [place] [address] [phone]  TOTAL KNEE REPLACEMENT POSTOPERATIVE DIRECTIONS    Knee Rehabilitation, Guidelines Following Surgery  Results after knee surgery are often greatly improved when you follow the exercise, range of motion and muscle strengthening exercises prescribed by your doctor. Safety measures are also important to protect the knee from further injury. If any of these exercises cause you to have increased pain or swelling in your knee joint, decrease the amount until you are comfortable again and slowly increase them. If you have problems or questions, call your caregiver or physical therapist for advice.   BLOOD CLOT PREVENTION Take a 10 mg Xarelto  once a day for three weeks following surgery.  Continue taking Aspirin  81 mg once daily. You may resume your vitamins/supplements once you have discontinued the Xarelto . Do not take any NSAIDs (Advil, Aleve , Ibuprofen, Meloxicam , etc.) until you have discontinued the Xarelto .   HOME CARE INSTRUCTIONS  Remove items at home which could result in a fall. This includes throw rugs or furniture in walking pathways.  ICE to the affected knee as much as tolerated. Icing helps control swelling. If the swelling is well controlled you will be more comfortable and rehab easier. Continue to use ice on the knee for pain and swelling from surgery. You may notice swelling that will progress down to the foot and ankle. This is normal after surgery. Elevate the leg when you are not up walking on it.    Continue to use the breathing machine which will help keep your temperature down. It is common for your temperature to cycle up and down following surgery, especially at night when you are not up moving around and exerting yourself. The breathing machine keeps your lungs expanded and your temperature down. Do not place pillow under the  operative knee, focus on keeping the knee straight while resting  DIET You may resume your previous home diet once you are discharged from the hospital.  DRESSING / WOUND CARE / SHOWERING Keep your bulky bandage on for 2 days. On the third post-operative day you may remove the Ace bandage and gauze. There is a waterproof adhesive bandage on your skin which will stay in place until your first follow-up appointment. Once you remove this you will not need to place another bandage You may begin showering 3 days following surgery, but do not submerge the incision under water .  ACTIVITY For the first 5 days, the key is rest and control of pain and swelling Do your home exercises twice a day starting on post-operative day 3. On the days you go to physical therapy, just do the home exercises once that day. You should rest, ice and elevate the leg for 50 minutes out of every hour. Get up and walk/stretch for 10 minutes per hour. After 5 days you can increase your activity slowly as tolerated. Walk with your walker as instructed. Use the walker until you are comfortable transitioning to a cane. Walk with the cane in the opposite hand of the operative leg. You may discontinue the cane once you are comfortable and walking steadily. Avoid periods of inactivity such as sitting longer than an hour when not asleep. This helps prevent blood clots.  You may discontinue the knee immobilizer once you are able to perform a straight leg raise while lying down. You may resume a sexual relationship in one month or when given the OK by your doctor.  You may return to work once you are cleared by your doctor.  Do not drive a car for 6 weeks or until released by your surgeon.  Do not drive while taking narcotics.  TED HOSE STOCKINGS Wear the elastic stockings on both legs for three weeks following surgery during the day. You may remove them at night for sleeping.  WEIGHT BEARING Weight bearing as tolerated with assist  device (walker, cane, etc) as directed, use it as long as suggested by your surgeon or therapist, typically at least 4-6 weeks.  POSTOPERATIVE CONSTIPATION PROTOCOL Constipation - defined medically as fewer than three stools per week and severe constipation as less than one stool per week.  One of the most common issues patients have following surgery is constipation.  Even if you have a regular bowel pattern at home, your normal regimen is likely to be disrupted due to multiple reasons following surgery.  Combination of anesthesia, postoperative narcotics, change in appetite and fluid intake all can affect your bowels.  In order to avoid complications following surgery, here are some recommendations in order to help you during your recovery period.  Colace (docusate) - Pick up an over-the-counter form of Colace or another stool softener and take twice a day as long as you are requiring postoperative pain medications.  Take with a full glass of water  daily.  If you experience loose stools or diarrhea, hold the colace until you stool forms back up. If your symptoms do not get better within 1 week or if they get worse, check with your doctor. Dulcolax (bisacodyl ) - Pick up over-the-counter and take as directed by the product packaging as needed to assist with the movement of your bowels.  Take with a full glass of water .  Use this product as needed if not relieved by Colace only.  MiraLax  (polyethylene glycol) - Pick up over-the-counter to have on hand. MiraLax  is a solution that will increase the amount of water  in your bowels to assist with bowel movements.  Take as directed and can mix with a glass of water , juice, soda, coffee, or tea. Take if you go more than two days without a movement. Do not use MiraLax  more than once per day. Call your doctor if you are still constipated or irregular after using this medication for 7 days in a row.  If you continue to have problems with postoperative constipation,  please contact the office for further assistance and recommendations.  If you experience the worst abdominal pain ever or develop nausea or vomiting, please contact the office immediatly for further recommendations for treatment.  ITCHING If you experience itching with your medications, try taking only a single pain pill, or even half a pain pill at a time.  You can also use Benadryl  over the counter for itching or also to help with sleep.   MEDICATIONS See your medication summary on the "After Visit Summary" that the nursing staff will review with you prior to discharge.  You may have some home medications which will be placed on hold until you complete the course of blood thinner medication.  It is important for you to complete the blood thinner medication as prescribed by your surgeon.  Continue your approved medications as instructed at time of discharge.  PRECAUTIONS If you experience chest pain or shortness of breath - call 911 immediately for transfer to the hospital emergency department.  If you develop a fever greater that 101 F, purulent drainage from wound, increased redness or drainage from  wound, foul odor from the wound/dressing, or calf pain - CONTACT YOUR SURGEON.                                                   FOLLOW-UP APPOINTMENTS Make sure you keep all of your appointments after your operation with your surgeon and caregivers. You should call the office at the above phone number and make an appointment for approximately two weeks after the date of your surgery or on the date instructed by your surgeon outlined in the After Visit Summary.  RANGE OF MOTION AND STRENGTHENING EXERCISES  Rehabilitation of the knee is important following a knee injury or an operation. After just a few days of immobilization, the muscles of the thigh which control the knee become weakened and shrink (atrophy). Knee exercises are designed to build up the tone and strength of the thigh muscles and to  improve knee motion. Often times heat used for twenty to thirty minutes before working out will loosen up your tissues and help with improving the range of motion but do not use heat for the first two weeks following surgery. These exercises can be done on a training (exercise) mat, on the floor, on a table or on a bed. Use what ever works the best and is most comfortable for you Knee exercises include:  Leg Lifts - While your knee is still immobilized in a splint or cast, you can do straight leg raises. Lift the leg to 60 degrees, hold for 3 sec, and slowly lower the leg. Repeat 10-20 times 2-3 times daily. Perform this exercise against resistance later as your knee gets better.  Quad and Hamstring Sets - Tighten up the muscle on the front of the thigh (Quad) and hold for 5-10 sec. Repeat this 10-20 times hourly. Hamstring sets are done by pushing the foot backward against an object and holding for 5-10 sec. Repeat as with quad sets.  Leg Slides: Lying on your back, slowly slide your foot toward your buttocks, bending your knee up off the floor (only go as far as is comfortable). Then slowly slide your foot back down until your leg is flat on the floor again. Angel Wings: Lying on your back spread your legs to the side as far apart as you can without causing discomfort.  A rehabilitation program following serious knee injuries can speed recovery and prevent re-injury in the future due to weakened muscles. Contact your doctor or a physical therapist for more information on knee rehabilitation.   POST-OPERATIVE OPIOID TAPER INSTRUCTIONS: It is important to wean off of your opioid medication as soon as possible. If you do not need pain medication after your surgery it is ok to stop day one. Opioids include: Codeine, Hydrocodone(Norco, Vicodin), Oxycodone (Percocet, oxycontin ) and hydromorphone  amongst others.  Long term and even short term use of opiods can cause: Increased pain  response Dependence Constipation Depression Respiratory depression And more.  Withdrawal symptoms can include Flu like symptoms Nausea, vomiting And more Techniques to manage these symptoms Hydrate well Eat regular healthy meals Stay active Use relaxation techniques(deep breathing, meditating, yoga) Do Not substitute Alcohol to help with tapering If you have been on opioids for less than two weeks and do not have pain than it is ok to stop all together.  Plan to wean off of opioids This plan should start  within one week post op of your joint replacement. Maintain the same interval or time between taking each dose and first decrease the dose.  Cut the total daily intake of opioids by one tablet each day Next start to increase the time between doses. The last dose that should be eliminated is the evening dose.   IF YOU ARE TRANSFERRED TO A SKILLED REHAB FACILITY If the patient is transferred to a skilled rehab facility following release from the hospital, a list of the current medications will be sent to the facility for the patient to continue.  When discharged from the skilled rehab facility, please have the facility set up the patient's Home Health Physical Therapy prior to being released. Also, the skilled facility will be responsible for providing the patient with their medications at time of release from the facility to include their pain medication, the muscle relaxants, and their blood thinner medication. If the patient is still at the rehab facility at time of the two week follow up appointment, the skilled rehab facility will also need to assist the patient in arranging follow up appointment in our office and any transportation needs.  MAKE SURE YOU:  Understand these instructions.  Get help right away if you are not doing well or get worse.   DENTAL ANTIBIOTICS:  In most cases prophylactic antibiotics for Dental procdeures after total joint surgery are not  necessary.  Exceptions are as follows:  1. History of prior total joint infection  2. Severely immunocompromised (Organ Transplant, cancer chemotherapy, Rheumatoid biologic meds such as Humera)  3. Poorly controlled diabetes (A1C &gt; 8.0, blood glucose over 200)  If you have one of these conditions, contact your surgeon for an antibiotic prescription, prior to your dental procedure.    Pick up stool softner and laxative for home use following surgery while on pain medications. Do not submerge incision under water . Please use good hand washing techniques while changing dressing each day. May shower starting three days after surgery. Please use a clean towel to pat the incision dry following showers. Continue to use ice for pain and swelling after surgery. Do not use any lotions or creams on the incision until instructed by your surgeon.     Information on my medicine - XARELTO  (Rivaroxaban )  Why was Xarelto  prescribed for you? Xarelto  was prescribed for you to reduce the risk of blood clots forming after orthopedic surgery. The medical term for these abnormal blood clots is venous thromboembolism (VTE).  What do you need to know about xarelto  ? Take your Xarelto  ONCE DAILY at the same time every day. You may take it either with or without food.  If you have difficulty swallowing the tablet whole, you may crush it and mix in applesauce just prior to taking your dose.  Take Xarelto  exactly as prescribed by your doctor and DO NOT stop taking Xarelto  without talking to the doctor who prescribed the medication.  Stopping without other VTE prevention medication to take the place of Xarelto  may increase your risk of developing a clot.  After discharge, you should have regular check-up appointments with your healthcare provider that is prescribing your Xarelto .    What do you do if you miss a dose? If you miss a dose, take it as soon as you remember on the same day then  continue your regularly scheduled once daily regimen the next day. Do not take two doses of Xarelto  on the same day.   Important Safety Information A possible side  effect of Xarelto  is bleeding. You should call your healthcare provider right away if you experience any of the following: Bleeding from an injury or your nose that does not stop. Unusual colored urine (red or dark brown) or unusual colored stools (red or black). Unusual bruising for unknown reasons. A serious fall or if you hit your head (even if there is no bleeding).  Some medicines may interact with Xarelto  and might increase your risk of bleeding while on Xarelto . To help avoid this, consult your healthcare provider or pharmacist prior to using any new prescription or non-prescription medications, including herbals, vitamins, non-steroidal anti-inflammatory drugs (NSAIDs) and supplements.  This website has more information on Xarelto : www.xarelto .com.

## 2024-02-20 NOTE — Anesthesia Procedure Notes (Signed)
 Procedure Name: MAC Date/Time: 02/20/2024 8:15 AM  Performed by: Nada Corean CROME, CRNAPre-anesthesia Checklist: Patient identified, Emergency Drugs available, Suction available, Timeout performed and Patient being monitored Patient Re-evaluated:Patient Re-evaluated prior to induction Oxygen Delivery Method: Simple face mask Preoxygenation: Pre-oxygenation with 100% oxygen Induction Type: IV induction Placement Confirmation: positive ETCO2

## 2024-02-20 NOTE — Evaluation (Signed)
 Physical Therapy Evaluation Patient Details Name: Hannah Vasquez MRN: [id number] DOB: 07-01-1951 Today's Date: 02/20/2024  History of Present Illness  73 yo female S/P Left TKA 02/20/24. PMH: MI,NASH,systolic HF,BBB  Clinical Impression  The patient is eager to get up and ambulate. Patient ambulated x 25' using the RW and Left KI. Patient reports the right knee does buckle( will have a TKA in future).   The patient's daughters will be assisting patient for ~ week, then less coverage.  Pt admitted with above diagnosis.  Pt currently with functional limitations due to the deficits listed below (see PT Problem List). Pt will benefit from acute skilled PT to increase their independence and safety with mobility to allow discharge.    `      If plan is discharge home, recommend the following: A little help with walking and/or transfers;A little help with bathing/dressing/bathroom;Assistance with cooking/housework;Assist for transportation;Help with stairs or ramp for entrance   Can travel by private vehicle        Equipment Recommendations Rolling walker (2 wheels) (youth)  Recommendations for Other Services       Functional Status Assessment Patient has had a recent decline in their functional status and demonstrates the ability to make significant improvements in function in a reasonable and predictable amount of time.     Precautions / Restrictions Precautions Precautions: Knee;Fall Precaution/Restrictions Comments: pt. states the  right knee buckles Required Braces or Orthoses: Knee Immobilizer - Left Knee Immobilizer - Left: Discontinue once straight leg raise with < 10 degree lag Restrictions Weight Bearing Restrictions Per Provider Order: No      Mobility  Bed Mobility Overal bed mobility: Needs Assistance Bed Mobility: Supine to Sit     Supine to sit: Min assist     General bed mobility comments: multimodal cues  to stay on task, cues for  technique     Transfers Overall transfer level: Needs assistance Equipment used: Rolling walker (2 wheels) Transfers: Sit to/from Stand Sit to Stand: Min assist           General transfer comment: cues for hand and left leg position    Ambulation/Gait Ambulation/Gait assistance: Min assist Gait Distance (Feet): 25 Feet Assistive device: Rolling walker (2 wheels) Gait Pattern/deviations: Step-to pattern Gait velocity: decr     General Gait Details: cues for sequence, patient tolerated well  Stairs            Wheelchair Mobility     Tilt Bed    Modified Rankin (Stroke Patients Only)       Balance Overall balance assessment: Needs assistance Sitting-balance support: No upper extremity supported, Feet supported Sitting balance-Leahy Scale: Good     Standing balance support: Bilateral upper extremity supported, During functional activity, Reliant on assistive device for balance Standing balance-Leahy Scale: Poor                               Pertinent Vitals/Pain Pain Assessment Pain Assessment: 0-10 Pain Score: 3  Pain Location: left knee Pain Descriptors / Indicators: Discomfort, Tightness Pain Intervention(s): Monitored during session, Ice applied, Premedicated before session    Home Living Family/patient expects to be discharged to:: Private residence Living Arrangements: Alone Available Help at Discharge: Family;Available 24 hours/day (for first week) Type of Home: House Home Access: Level entry     Alternate Level Stairs-Number of Steps: 2 Home Layout: Multi-level Home Equipment: None;BSC/3in1      Prior Function Prior  Level of Function : Independent/Modified Independent                     Extremity/Trunk Assessment   Upper Extremity Assessment Upper Extremity Assessment: Overall WFL for tasks assessed    Lower Extremity Assessment Lower Extremity Assessment: LLE deficits/detail LLE Deficits / Details: + SLR and knee flex  to ! 45* LLE Sensation: WNL       Communication   Communication Communication: Impaired    Cognition Arousal: Alert Behavior During Therapy: WFL for tasks assessed/performed   PT - Cognitive impairments: Difficult to assess, Sequencing, Awareness                       PT - Cognition Comments: required increased time to process and repetition of  instruction/precautions         Cueing Cueing Techniques: Verbal cues, Tactile cues     General Comments      Exercises Total Joint Exercises Ankle Circles/Pumps: AROM, Both, 10 reps Quad Sets: AROM, 10 reps, Left, Supine Heel Slides: AROM, 10 reps, Left, Supine   Assessment/Plan    PT Assessment Patient needs continued PT services  PT Problem List Decreased strength;Decreased mobility;Decreased safety awareness;Decreased range of motion;Decreased knowledge of precautions;Decreased activity tolerance;Pain       PT Treatment Interventions DME instruction;Therapeutic activities;Gait training;Therapeutic exercise;Patient/family education;Stair training;Functional mobility training    PT Goals (Current goals can be found in the Care Plan section)  Acute Rehab PT Goals Patient Stated Goal: go home, g right TK PT Goal Formulation: With patient/family Time For Goal Achievement: 02/27/24 Potential to Achieve Goals: Good    Frequency 7X/week     Co-evaluation               AM-PAC PT 6 Clicks Mobility  Outcome Measure Help needed turning from your back to your side while in a flat bed without using bedrails?: A Little Help needed moving from lying on your back to sitting on the side of a flat bed without using bedrails?: A Little Help needed moving to and from a bed to a chair (including a wheelchair)?: A Little Help needed standing up from a chair using your arms (e.g., wheelchair or bedside chair)?: A Little Help needed to walk in hospital room?: A Little Help needed climbing 3-5 steps with a railing? : A  Lot 6 Click Score: 17    End of Session Equipment Utilized During Treatment: Gait belt Activity Tolerance: Patient tolerated treatment well Patient left: in chair;with call bell/phone within reach;with chair alarm set;with family/visitor present Nurse Communication: Mobility status PT Visit Diagnosis: Unsteadiness on feet (R26.81);Muscle weakness (generalized) (M62.81);Difficulty in walking, not elsewhere classified (R26.2);Pain Pain - Right/Left: Left Pain - part of body: Knee    Time: 8557-8494 PT Time Calculation (min) (ACUTE ONLY): 23 min   Charges:   PT Evaluation $PT Eval Low Complexity: 1 Low PT Treatments $Gait Training: 8-22 mins PT General Charges $$ ACUTE PT VISIT: 1 Visit         Darice Potters PT Acute Rehabilitation Services Office [phone]   Potters Darice Norris 02/20/2024, 3:44 PM

## 2024-02-20 NOTE — Anesthesia Procedure Notes (Signed)
 Spinal  Patient location during procedure: OR Start time: 02/20/2024 8:06 AM End time: 02/20/2024 8:11 AM Reason for block: surgical anesthesia Staffing Performed: anesthesiologist  Anesthesiologist: Epifanio Charleston, MD Performed by: Epifanio Charleston, MD Authorized by: Epifanio Charleston, MD   Preanesthetic Checklist Completed: patient identified, IV checked, site marked, risks and benefits discussed, surgical consent, monitors and equipment checked, pre-op  evaluation and timeout performed Spinal Block Patient position: sitting Prep: DuraPrep Patient monitoring: heart rate, cardiac monitor, continuous pulse ox and blood pressure Approach: midline Location: L3-4 Injection technique: single-shot Needle Needle type: Pencan  Needle gauge: 24 G Needle length: 9 cm Assessment Sensory level: T4 Events: CSF return

## 2024-02-20 NOTE — Progress Notes (Signed)
 Orthopedic Tech Progress Note Patient Details:  Hannah Vasquez 03-18-1951 [id number]  CPM Left Knee CPM Left Knee: On Left Knee Flexion (Degrees): 40 Left Knee Extension (Degrees): 10  Post Interventions Patient Tolerated: Well  [place] Hannah Vasquez 02/20/2024, 9:56 AM

## 2024-02-20 NOTE — Progress Notes (Signed)
   02/20/24 2303  BiPAP/CPAP/SIPAP  $ Non-Invasive Home Ventilator  Initial  BiPAP/CPAP/SIPAP Pt Type Adult  BiPAP/CPAP/SIPAP DREAMSTATIOND  Mask Type Nasal mask  Dentures removed? Not applicable  Set Rate 0 breaths/min  Respiratory Rate 18 breaths/min  FiO2 (%) 21 %  Patient Home Machine No  Patient Home Mask Yes  Patient Home Tubing Yes  Auto Titrate Yes  Minimum cmH2O 5 cmH2O  Maximum cmH2O 15 cmH2O  Device Plugged into RED Power Outlet Yes

## 2024-02-20 NOTE — Care Plan (Signed)
 Ortho Bundle Case Management Note  Patient Details  Name: Hannah Vasquez MRN: [id number] Date of Birth: 01-14-1951  L TKA on 02/20/24  DCP: Home with daughter DME: RW ordered through [facility] PT: [facility]                   DME Arranged:  Vannie rolling DME Agency:  [employer]  HH Arranged:    HH Agency:     Additional Comments: Please contact me with any questions of if this plan should need to change.  Lyle Pepper, [place] [facility] [phone]   02/20/2024, 7:57 AM

## 2024-02-20 NOTE — Anesthesia Procedure Notes (Signed)
 Anesthesia Regional Block: Adductor canal block   Pre-Anesthetic Checklist: , timeout performed,  Correct Patient, Correct Site, Correct Laterality,  Correct Procedure, Correct Position, site marked,  Risks and benefits discussed,  Surgical consent,  Pre-op  evaluation,  At surgeon's request and post-op pain management  Laterality: Left  Prep: chloraprep       Needles:  Injection technique: Single-shot  Needle Type: Echogenic Needle     Needle Length: 9cm  Needle Gauge: 21     Additional Needles:   Procedures:,,,, ultrasound used (permanent image in chart),,    Narrative:  Start time: 02/20/2024 7:20 AM End time: 02/20/2024 7:25 AM Injection made incrementally with aspirations every 5 mL.  Performed by: Personally  Anesthesiologist: Epifanio Charleston, MD

## 2024-02-20 NOTE — Op Note (Signed)
 OPERATIVE REPORT-TOTAL KNEE ARTHROPLASTY   Pre-operative diagnosis- Osteoarthritis  Left knee(s)  Post-operative diagnosis- Osteoarthritis Left knee(s)  Procedure-  Left  Total Knee Arthroplasty  Surgeon- Dempsey GAILS. Cieara Stierwalt, MD  Assistant- Zelda Kobs, PA-C  Anesthesia-  Adductor canal block and spinal  EBL- 25 ml   Drains None  Tourniquet time-  Total Tourniquet Time Documented: Thigh (Left) - 33 minutes Total: Thigh (Left) - 33 minutes     Complications- None  Condition-PACU - hemodynamically stable.   Brief Clinical Note  Hannah Vasquez is a 73 y.o. year old female with end stage OA of her left knee with progressively worsening pain and dysfunction. She has constant pain, with activity and at rest and significant functional deficits with difficulties even with ADLs. She has had extensive non-op management including analgesics, injections of cortisone and viscosupplements, and home exercise program, but remains in significant pain with significant dysfunction. Radiographs show bone on bone arthritis medial and patellofemoral. She presents now for left Total Knee Arthroplasty.     Procedure in detail---   The patient is brought into the operating room and positioned supine on the operating table. After successful administration of  Adductor canal block and spinal,   a tourniquet is placed high on the  Left thigh(s) and the lower extremity is prepped and draped in the usual sterile fashion. Time out is performed by the operating team and then the  Left lower extremity is wrapped in Esmarch, knee flexed and the tourniquet inflated to 300 mmHg.       A midline incision is made with a ten blade through the subcutaneous tissue to the level of the extensor mechanism. A fresh blade is used to make a medial parapatellar arthrotomy. Soft tissue over the proximal medial tibia is subperiosteally elevated to the joint line with a knife and into the semimembranosus bursa with a Cobb  elevator. Soft tissue over the proximal lateral tibia is elevated with attention being paid to avoiding the patellar tendon on the tibial tubercle. The patella is everted, knee flexed 90 degrees and the ACL and PCL are removed. Findings are bone on bone medial and patellofemoral with large global osteophytes        The drill is used to create a starting hole in the distal femur and the canal is thoroughly irrigated with sterile saline to remove the fatty contents. The 5 degree Left  valgus alignment guide is placed into the femoral canal and the distal femoral cutting block is pinned to remove 10 mm off the distal femur. Resection is made with an oscillating saw.      The tibia is subluxed forward and the menisci are removed. The extramedullary alignment guide is placed referencing proximally at the medial aspect of the tibial tubercle and distally along the second metatarsal axis and tibial crest. The block is pinned to remove 2mm off the more deficient medial  side. Resection is made with an oscillating saw. Size 5is the most appropriate size for the tibia and the proximal tibia is prepared with the modular drill and keel punch for that size.      The femoral sizing guide is placed and size 5 is most appropriate. Rotation is marked off the epicondylar axis and confirmed by creating a rectangular flexion gap at 90 degrees. The size 5 cutting block is pinned in this rotation and the anterior, posterior and chamfer cuts are made with the oscillating saw. The intercondylar block is then placed and that cut is made.  Trial size 5 tibial component, trial size 5 posterior stabilized femur and a 10  mm posterior stabilized rotating platform insert trial is placed. Full extension is achieved with excellent varus/valgus and anterior/posterior balance throughout full range of motion. The patella is everted and thickness measured to be 22  mm. Free hand resection is taken to 12 mm, a 38 template is placed, lug holes  are drilled, trial patella is placed, and it tracks normally. Osteophytes are removed off the posterior femur with the trial in place. All trials are removed and the cut bone surfaces prepared with pulsatile lavage. Cement is mixed and once ready for implantation, the size 5 tibial implant, size  5 posterior stabilized femoral component, and the size 38 patella are cemented in place and the patella is held with the clamp. The trial insert is placed and the knee held in full extension. The Exparel  (20 ml mixed with 60 ml saline) is injected into the extensor mechanism, posterior capsule, medial and lateral gutters and subcutaneous tissues.  All extruded cement is removed and once the cement is hard the permanent 10 mm posterior stabilized rotating platform insert is placed into the tibial tray.      The wound is copiously irrigated with saline solution and the extensor mechanism closed with # 0 Stratofix suture. The tourniquet is released for a total tourniquet time of 33  minutes. Flexion against gravity is 140 degrees and the patella tracks normally. Subcutaneous tissue is closed with 2.0 vicryl and subcuticular with running 4.0 Monocryl. The incision is cleaned and dried and steri-strips and a bulky sterile dressing are applied. The limb is placed into a knee immobilizer and the patient is awakened and transported to recovery in stable condition.      Please note that a surgical assistant was a medical necessity for this procedure in order to perform it in a safe and expeditious manner. Surgical assistant was necessary to retract the ligaments and vital neurovascular structures to prevent injury to them and also necessary for proper positioning of the limb to allow for anatomic placement of the prosthesis.   Dempsey ROCKFORD Isaih Bulger, MD    02/20/2024, 9:17 AM

## 2024-02-20 NOTE — Anesthesia Postprocedure Evaluation (Signed)
 Anesthesia Post Note  Patient: Hannah Vasquez  Procedure(s) Performed: ARTHROPLASTY, KNEE, TOTAL (Left: Knee)     Patient location during evaluation: PACU Anesthesia Type: General Level of consciousness: awake and alert Pain management: pain level controlled Vital Signs Assessment: post-procedure vital signs reviewed and stable Respiratory status: spontaneous breathing and respiratory function stable Cardiovascular status: blood pressure returned to baseline and stable Postop Assessment: spinal receding Anesthetic complications: no   No notable events documented.  Last Vitals:  Vitals:   02/20/24 1145 02/20/24 1746  BP: 120/60 (!) 143/64  Pulse: (!) 47 75  Resp: 15 16  Temp: (!) 36.4 C 36.7 C  SpO2: 95% 95%    Last Pain:  Vitals:   02/20/24 2018  TempSrc:   PainSc: 9                  Epifanio Lamar BRAVO

## 2024-02-20 NOTE — Transfer of Care (Signed)
 Immediate Anesthesia Transfer of Care Note  Patient: Rock [name]  Procedure(s) Performed: ARTHROPLASTY, KNEE, TOTAL (Left: Knee)  Patient Location: PACU  Anesthesia Type:MAC  Level of Consciousness: awake, alert , oriented, and patient cooperative  Airway & Oxygen Therapy: Patient Spontanous Breathing  Post-op Assessment: Report given to RN and Post -op Vital signs reviewed and stable  Post vital signs: Reviewed and stable  Last Vitals:  Vitals Value Taken Time  BP 105/65 02/20/24 09:36  Temp 97   Pulse 52 02/20/24 09:38  Resp 10 02/20/24 09:38  SpO2 96 % 02/20/24 09:38  Vitals shown include unfiled device data.  Last Pain:  Vitals:   02/20/24 0644  TempSrc:   PainSc: 2          Complications: No notable events documented.

## 2024-02-20 NOTE — Interval H&P Note (Signed)
 History and Physical Interval Note:  02/20/2024 6:32 AM  Hannah Vasquez  has presented today for surgery, with the diagnosis of Left knee osteoarthritis.  The various methods of treatment have been discussed with the patient and family. After consideration of risks, benefits and other options for treatment, the patient has consented to  Procedure(s): ARTHROPLASTY, KNEE, TOTAL (Left) as a surgical intervention.  The patient's history has been reviewed, patient examined, no change in status, stable for surgery.  I have reviewed the patient's chart and labs.  Questions were answered to the patient's satisfaction.     Dempsey Sora Olivo

## 2024-02-20 NOTE — Progress Notes (Signed)
 Orthopedic Tech Progress Note Patient Details:  Hannah Vasquez September 02, 1950 [id number]  CPM Left Knee CPM Left Knee: Off Left Knee Flexion (Degrees): 40 Left Knee Extension (Degrees): 10  Post Interventions Patient Tolerated: Well  Hannah Vasquez Hannah Vasquez 02/20/2024, 1:54 PM

## 2024-02-21 ENCOUNTER — Other Ambulatory Visit (HOSPITAL_COMMUNITY): Payer: Self-pay

## 2024-02-21 ENCOUNTER — Encounter (HOSPITAL_COMMUNITY): Payer: Self-pay | Admitting: Orthopedic Surgery

## 2024-02-21 DIAGNOSIS — M1712 Unilateral primary osteoarthritis, left knee: Secondary | ICD-10-CM | POA: Diagnosis not present

## 2024-02-21 LAB — BASIC METABOLIC PANEL WITH GFR
Anion gap: 10 (ref 5–15)
BUN: 11 mg/dL (ref 8–23)
CO2: 23 mmol/L (ref 22–32)
Calcium: 8.7 mg/dL — ABNORMAL LOW (ref 8.9–10.3)
Chloride: 108 mmol/L (ref 98–111)
Creatinine, Ser: 1.1 mg/dL — ABNORMAL HIGH (ref 0.44–1.00)
GFR, Estimated: 52 mL/min — ABNORMAL LOW (ref >60)
Glucose, Bld: 180 mg/dL — ABNORMAL HIGH (ref 70–99)
Potassium: 3.9 mmol/L (ref 3.5–5.1)
Sodium: 141 mmol/L (ref 135–145)

## 2024-02-21 LAB — CBC
HCT: 30.4 % — ABNORMAL LOW (ref 36.0–46.0)
Hemoglobin: 10.2 g/dL — ABNORMAL LOW (ref 12.0–15.0)
MCH: 30.9 pg (ref 26.0–34.0)
MCHC: 33.6 g/dL (ref 30.0–36.0)
MCV: 92.1 fL (ref 80.0–100.0)
Platelets: 282 K/uL (ref 150–400)
RBC: 3.3 MIL/uL — ABNORMAL LOW (ref 3.87–5.11)
RDW: 13.8 % (ref 11.5–15.5)
WBC: 19.7 K/uL — ABNORMAL HIGH (ref 4.0–10.5)
nRBC: 0 % (ref 0.0–0.2)

## 2024-02-21 MED ORDER — ONDANSETRON HCL 4 MG PO TABS
4.0000 mg | ORAL_TABLET | Freq: Four times a day (QID) | ORAL | 0 refills | Status: AC | PRN
  Filled 2024-02-21: qty 20, 5d supply, fill #0

## 2024-02-21 MED ORDER — TRAMADOL HCL 50 MG PO TABS
50.0000 mg | ORAL_TABLET | Freq: Four times a day (QID) | ORAL | 0 refills | Status: DC | PRN
  Filled 2024-02-21 (×2): qty 40, 10d supply, fill #0

## 2024-02-21 MED ORDER — METHOCARBAMOL 500 MG PO TABS
500.0000 mg | ORAL_TABLET | Freq: Four times a day (QID) | ORAL | 0 refills | Status: DC | PRN
Start: 2024-02-21 — End: 2024-06-12
  Filled 2024-02-21: qty 40, 10d supply, fill #0

## 2024-02-21 MED ORDER — RIVAROXABAN 10 MG PO TABS
10.0000 mg | ORAL_TABLET | Freq: Every day | ORAL | 0 refills | Status: DC
  Filled 2024-02-21: qty 20, 20d supply, fill #0

## 2024-02-21 MED ORDER — OXYCODONE HCL 5 MG PO TABS
5.0000 mg | ORAL_TABLET | Freq: Four times a day (QID) | ORAL | 0 refills | Status: DC | PRN
  Filled 2024-02-21: qty 42, 6d supply, fill #0

## 2024-02-21 NOTE — Plan of Care (Signed)
  Problem: Pain Management: Goal: Pain level will decrease with appropriate interventions Outcome: Progressing   Problem: Clinical Measurements: Goal: Postoperative complications will be avoided or minimized Outcome: Progressing   Problem: Activity: Goal: Range of joint motion will improve Outcome: Progressing   Problem: Safety: Goal: Ability to remain free from injury will improve Outcome: Progressing

## 2024-02-21 NOTE — Progress Notes (Signed)
 Physical Therapy Treatment Patient Details Name: Hannah Vasquez MRN: [id number] DOB: Jun 01, 1951 Today's Date: 02/21/2024   History of Present Illness 73 yo female S/P Left TKA 02/20/24. PMH: MI,NASH,systolic HF,BBB    PT Comments  Pt seated in recliner.  Reports supported extension is painful.  Pt educated on benefits of supported extension.  Pt eager to return home.  Reviewed stair training and patient tolerated well.  Re-educated on performing exercises 3x daily.      If plan is discharge home, recommend the following: A little help with walking and/or transfers;A little help with bathing/dressing/bathroom;Assistance with cooking/housework;Assist for transportation;Help with stairs or ramp for entrance   Can travel by private vehicle        Equipment Recommendations  Rolling walker (2 wheels)    Recommendations for Other Services       Precautions / Restrictions Precautions Precautions: Knee;Fall Precaution/Restrictions Comments: pt. states the  right knee buckles Required Braces or Orthoses: Knee Immobilizer - Left (d/c on 7/8 as she is able to perform 10 SLRs unassisted.) Knee Immobilizer - Left: Discontinue once straight leg raise with < 10 degree lag Restrictions Weight Bearing Restrictions Per Provider Order: No     Mobility  Bed Mobility Overal bed mobility: Needs Assistance Bed Mobility: Supine to Sit     Supine to sit: Supervision     General bed mobility comments: Pt received in recliner.    Transfers Overall transfer level: Needs assistance Equipment used: Rolling walker (2 wheels) Transfers: Sit to/from Stand Sit to Stand: Min assist           General transfer comment: Cues for hand placement and forward advancement of LLE to improve ease and reduce pain.  Continues to reach for RW initially and required cues to correct.    Ambulation/Gait Ambulation/Gait assistance: Min assist Gait Distance (Feet): 75 Feet Assistive device: Rolling walker (2  wheels) Gait Pattern/deviations: Step-to pattern, Decreased weight shift to left, Knees buckling, Antalgic Gait velocity: decr     General Gait Details: Cues for RW position and sequencing to improve gt symmetry.  Cues for weight shifting to L side and to soften landing of R foot.  X 1 instance of buckling in R side but able to self correct.   Stairs Stairs: Yes Stairs assistance: Min assist Stair Management: Two rails, Forwards Number of Stairs: 2 General stair comments: Cues for sequencing and hand placement, No LOB provided min assistance for safety as R knee ( non-operative leg) has a history of buckling but this did not occur.   Wheelchair Mobility     Tilt Bed    Modified Rankin (Stroke Patients Only)       Balance Overall balance assessment: Needs assistance Sitting-balance support: No upper extremity supported, Feet supported Sitting balance-Leahy Scale: Good       Standing balance-Leahy Scale: Poor                              Communication    Cognition Arousal: Alert Behavior During Therapy: WFL for tasks assessed/performed   PT - Cognitive impairments: Difficult to assess, Sequencing, Awareness                       PT - Cognition Comments: required increased time to process and repetition of  instruction/precautions        Cueing    Exercises Total Joint Exercises Ankle Circles/Pumps: AROM, Both, 20 reps, Supine Quad  Sets: AROM, Left, 10 reps, Supine Heel Slides: AROM, AAROM, Left, 10 reps, Supine Hip ABduction/ADduction: AROM, AAROM, Left, 10 reps, Supine Straight Leg Raises: AROM, Left, 10 reps, Supine, AAROM (gave AAROM this pm due to pain.) Goniometric ROM: 9-71 L knee ROM.    General Comments        Pertinent Vitals/Pain Pain Assessment Pain Assessment: 0-10 Pain Score: 9  Pain Location: left knee Pain Descriptors / Indicators: Discomfort, Tightness Pain Intervention(s): Monitored during session, Repositioned,  Patient requesting pain meds-RN notified, RN gave pain meds during session    Home Living                          Prior Function            PT Goals (current goals can now be found in the care plan section) Acute Rehab PT Goals Patient Stated Goal: get home and brush teeth Potential to Achieve Goals: Good Progress towards PT goals: Progressing toward goals    Frequency    7X/week      PT Plan      Co-evaluation              AM-PAC PT 6 Clicks Mobility   Outcome Measure  Help needed turning from your back to your side while in a flat bed without using bedrails?: A Little Help needed moving from lying on your back to sitting on the side of a flat bed without using bedrails?: A Little Help needed moving to and from a bed to a chair (including a wheelchair)?: A Little Help needed standing up from a chair using your arms (e.g., wheelchair or bedside chair)?: A Little Help needed to walk in hospital room?: A Little Help needed climbing 3-5 steps with a railing? : A Little 6 Click Score: 18    End of Session Equipment Utilized During Treatment: Gait belt Activity Tolerance: Patient tolerated treatment well Patient left: with call bell/phone within reach;in bed (sitting edge of bed to allow for IV removal.) Nurse Communication: Mobility status PT Visit Diagnosis: Unsteadiness on feet (R26.81);Muscle weakness (generalized) (M62.81);Difficulty in walking, not elsewhere classified (R26.2);Pain Pain - Right/Left: Left Pain - part of body: Knee     Time: 8761-8687 PT Time Calculation (min) (ACUTE ONLY): 34 min  Charges:    $Gait Training: 8-22 mins $Therapeutic Activity: 8-22 mins PT General Charges $$ ACUTE PT VISIT: 1 Visit                     Toya HAMS , PTA Acute Rehabilitation Services Office [phone]    Silvie Obremski JINNY Gosling 02/21/2024, 1:41 PM

## 2024-02-21 NOTE — Progress Notes (Signed)
 TOC meds in a secure bag delivered to pt in room by this RN.

## 2024-02-21 NOTE — Progress Notes (Signed)
 Physical Therapy Treatment Patient Details Name: NYAZIA CANEVARI MRN: [id number] DOB: [birth year] Today's Date: 02/21/2024   History of Present Illness 73 yo female S/P Left TKA 02/20/24. PMH: MI,NASH,systolic HF,BBB    PT Comments  Pt supine in bed on arrival.  Pt eager to mobilize.  Able to perform x 10 SLRs AROM dso KI not utilized.  Minor buckling on R side ( non-surgical limb).  Pt continues to improve.  Plan for stair training this pm before d/c home.     If plan is discharge home, recommend the following: A little help with walking and/or transfers;A little help with bathing/dressing/bathroom;Assistance with cooking/housework;Assist for transportation;Help with stairs or ramp for entrance   Can travel by private vehicle        Equipment Recommendations  Rolling walker (2 wheels) (youth)    Recommendations for Other Services       Precautions / Restrictions Precautions Precautions: Knee;Fall Precaution/Restrictions Comments: pt. states the  right knee buckles Required Braces or Orthoses: Knee Immobilizer - Left (d/c on 7/8 as she is able to do 10 SLRs) Knee Immobilizer - Left: Discontinue once straight leg raise with < 10 degree lag Restrictions Weight Bearing Restrictions Per Provider Order: No     Mobility  Bed Mobility Overal bed mobility: Needs Assistance Bed Mobility: Supine to Sit     Supine to sit: Supervision     General bed mobility comments: Increased time and effort but able to move to edge of bed unsupported.    Transfers Overall transfer level: Needs assistance Equipment used: Rolling walker (2 wheels) Transfers: Sit to/from Stand Sit to Stand: Min assist           General transfer comment: Cues for hand placement to and from seated surface.  Cues to advance LLE forward to reduce pain and improve ease of transition.    Ambulation/Gait Ambulation/Gait assistance: Min assist Gait Distance (Feet): 60 Feet Assistive device: Rolling walker (2  wheels) Gait Pattern/deviations: Step-to pattern, Decreased weight shift to left, Knees buckling, Antalgic       General Gait Details: Cues for RW position and sequencing to improve gt symmetry.  Cues for weight shifting to L side and to soften landing of R foot.   Stairs             Wheelchair Mobility     Tilt Bed    Modified Rankin (Stroke Patients Only)       Balance Overall balance assessment: Needs assistance Sitting-balance support: No upper extremity supported, Feet supported Sitting balance-Leahy Scale: Good       Standing balance-Leahy Scale: Poor                              Communication    Cognition Arousal: Alert Behavior During Therapy: WFL for tasks assessed/performed   PT - Cognitive impairments: Difficult to assess, Sequencing, Awareness                       PT - Cognition Comments: required increased time to process and repetition of  instruction/precautions        Cueing    Exercises Total Joint Exercises Ankle Circles/Pumps: AROM, Both, 20 reps, Supine Quad Sets: AROM, Left, 10 reps, Supine Heel Slides: AROM, AAROM, Left, 10 reps, Supine Hip ABduction/ADduction: AROM, AAROM, Left, 10 reps, Supine Straight Leg Raises: AROM, Left, 10 reps, Supine    General Comments  Pertinent Vitals/Pain Pain Assessment Pain Assessment: 0-10 Pain Location: left knee Pain Descriptors / Indicators: Discomfort, Tightness Pain Intervention(s): Monitored during session, Repositioned    Home Living                          Prior Function            PT Goals (current goals can now be found in the care plan section) Acute Rehab PT Goals Patient Stated Goal: go home, get right TK Potential to Achieve Goals: Good Progress towards PT goals: Progressing toward goals    Frequency    7X/week      PT Plan      Co-evaluation              AM-PAC PT 6 Clicks Mobility   Outcome Measure  Help  needed turning from your back to your side while in a flat bed without using bedrails?: A Little Help needed moving from lying on your back to sitting on the side of a flat bed without using bedrails?: A Little Help needed moving to and from a bed to a chair (including a wheelchair)?: A Little Help needed standing up from a chair using your arms (e.g., wheelchair or bedside chair)?: A Little Help needed to walk in hospital room?: A Little Help needed climbing 3-5 steps with a railing? : A Lot 6 Click Score: 17    End of Session Equipment Utilized During Treatment: Gait belt Activity Tolerance: Patient tolerated treatment well Patient left: in chair;with call bell/phone within reach;with chair alarm set;with family/visitor present Nurse Communication: Mobility status PT Visit Diagnosis: Unsteadiness on feet (R26.81);Muscle weakness (generalized) (M62.81);Difficulty in walking, not elsewhere classified (R26.2);Pain Pain - Right/Left: Left Pain - part of body: Knee     Time: 0931-1011 PT Time Calculation (min) (ACUTE ONLY): 40 min  Charges:    $Gait Training: 8-22 mins $Therapeutic Exercise: 8-22 mins $Therapeutic Activity: 8-22 mins PT General Charges $$ ACUTE PT VISIT: 1 Visit                     Toya HAMS , PTA Acute Rehabilitation Services Office [phone]    Heaven Meeker JINNY Gosling 02/21/2024, 11:21 AM

## 2024-02-21 NOTE — TOC Transition Note (Addendum)
 Transition of Care [facility]) - Discharge Note   Patient Details  Name: Hannah Vasquez MRN: [id number] Date of Birth: 05-15-1951  [facility] CM/SW Contact:  Alfonse JONELLE Rex, RN Phone Number: 02/21/2024, 12:44 PM   Clinical Narrative:   Met with patient at bedside to review dc therapy and home equipment needs, pt confirmed [facility] [place], needs a RW. Youth RW delivered to bedside by [employer]. No TOC needs.     Final next level of care: OP Rehab     Patient Goals and CMS Choice            Discharge Placement                       Discharge Plan and Services Additional resources added to the After Visit Summary for                  DME Arranged: Walker rolling DME Agency: [employer]                  Social Drivers of Health (SDOH) Interventions SDOH Screenings   Food Insecurity: No Food Insecurity (02/20/2024)  Housing: Low Risk  (02/20/2024)  Transportation Needs: No Transportation Needs (02/20/2024)  Utilities: Not At Risk (02/20/2024)  Alcohol Screen: Low Risk  (11/11/2020)  Depression (PHQ2-9): Medium Risk (12/08/2021)  Financial Resource Strain: Low Risk  (10/04/2023)   Received from [facility]  Physical Activity: Insufficiently Active (11/11/2020)  Social Connections: Unknown (02/20/2024)  Stress: No Stress Concern Present (11/11/2020)  Tobacco Use: Low Risk  (02/20/2024)     Readmission Risk Interventions     No data to display

## 2024-02-21 NOTE — Progress Notes (Signed)
 Subjective: 1 Day Post-Op Procedure(s) (LRB): ARTHROPLASTY, KNEE, TOTAL (Left) Patient reports pain as mild.   Patient seen in rounds by Dr. Melodi. Patient is well, and has had no acute complaints or problems No issues overnight. Denies chest pain, SOB, or calf pain. Foley catheter removed this AM.  We will continue therapy today, ambulated 25' yesterday.   Objective: Vital signs in last 24 hours: Temp:  [97 F (36.1 C)-98.3 F (36.8 C)] 98.2 F (36.8 C) (07/08 0553) Pulse Rate:  [44-75] 63 (07/08 0553) Resp:  [10-18] 18 (07/08 0553) BP: (101-144)/(54-88) 130/61 (07/08 0553) SpO2:  [94 %-100 %] 97 % (07/08 0553) FiO2 (%):  [21 %] 21 % (07/07 2303)  Intake/Output from previous day:  Intake/Output Summary (Last 24 hours) at 02/21/2024 0839 Last data filed at 02/21/2024 0650 Gross per 24 hour  Intake 3030.74 ml  Output 2120 ml  Net 910.74 ml     Intake/Output this shift: No intake/output data recorded.  Labs: Recent Labs    02/21/24 0350  HGB 10.2*   Recent Labs    02/21/24 0350  WBC 19.7*  RBC 3.30*  HCT 30.4*  PLT 282   Recent Labs    02/21/24 0350  NA 141  K 3.9  CL 108  CO2 23  BUN 11  CREATININE 1.10*  GLUCOSE 180*  CALCIUM  8.7*   No results for input(s): LABPT, INR in the last 72 hours.  Exam: General - Patient is Alert and Oriented Extremity - Neurologically intact Neurovascular intact Sensation intact distally Dorsiflexion/Plantar flexion intact Dressing - dressing C/D/I Motor Function - intact, moving foot and toes well on exam.   Past Medical History:  Diagnosis Date   Advanced hepatic fibrosis    Allergy    Anxiety    Arthritis    CAD (coronary artery disease)    Cataract    Chronic systolic heart failure (HCC)    DDD (degenerative disc disease), thoracolumbar    Does use hearing aid    bilateral as of 10/05/2019   Elevated alkaline phosphatase level    Gallstones    GERD (gastroesophageal reflux disease)    Hemorrhoids     Hepatic steatosis    History of kidney stones    History of MI (myocardial infarction) 10/07/2017   Hypertension    Mitral valve prolapse    Myocardial infarction (HCC)    NASH (nonalcoholic steatohepatitis)    OSA (obstructive sleep apnea)    Osteoporosis    RBBB    Sarcoidosis    Skin cancer    Face   Status post dilation of esophageal narrowing     Assessment/Plan: 1 Day Post-Op Procedure(s) (LRB): ARTHROPLASTY, KNEE, TOTAL (Left) Principal Problem:   OA (osteoarthritis) of knee  Estimated body mass index is 29.16 kg/m as calculated from the following:   Height as of this encounter: 5' 1 (1.549 m).   Weight as of this encounter: 70 kg. Advance diet Up with therapy D/C IV fluids   Patient's anticipated LOS is less than 2 midnights, meeting these requirements: - Lives within 1 hour of care - Has a competent adult at home to recover with post-op recover - NO history of  - Chronic pain requiring opiods  - Diabetes  - Heart failure  - Stroke  - DVT/VTE  - Cardiac arrhythmia  - Respiratory Failure/COPD  - Renal failure  - Anemia  - Advanced Liver disease   DVT Prophylaxis - Xarelto  Weight bearing as tolerated. Continue therapy.  Plan  is to go Home after hospital stay. Plan for discharge later today if progresses with therapy and meeting goals. Scheduled for OPPT at [facility]. Follow-up in the office in 2 weeks.  The PDMP database was reviewed today prior to any opioid medications being prescribed to this patient.  Roxie Mess, PA-C Orthopedic Surgery [phone] 02/21/2024, 8:39 AM

## 2024-02-24 DIAGNOSIS — R2689 Other abnormalities of gait and mobility: Secondary | ICD-10-CM | POA: Diagnosis not present

## 2024-02-24 DIAGNOSIS — M25562 Pain in left knee: Secondary | ICD-10-CM | POA: Diagnosis not present

## 2024-02-27 DIAGNOSIS — M25562 Pain in left knee: Secondary | ICD-10-CM | POA: Diagnosis not present

## 2024-02-27 DIAGNOSIS — R2689 Other abnormalities of gait and mobility: Secondary | ICD-10-CM | POA: Diagnosis not present

## 2024-02-29 DIAGNOSIS — R2689 Other abnormalities of gait and mobility: Secondary | ICD-10-CM | POA: Diagnosis not present

## 2024-02-29 DIAGNOSIS — M25562 Pain in left knee: Secondary | ICD-10-CM | POA: Diagnosis not present

## 2024-03-02 DIAGNOSIS — M25562 Pain in left knee: Secondary | ICD-10-CM | POA: Diagnosis not present

## 2024-03-02 DIAGNOSIS — R2689 Other abnormalities of gait and mobility: Secondary | ICD-10-CM | POA: Diagnosis not present

## 2024-03-05 DIAGNOSIS — R2689 Other abnormalities of gait and mobility: Secondary | ICD-10-CM | POA: Diagnosis not present

## 2024-03-05 DIAGNOSIS — M25562 Pain in left knee: Secondary | ICD-10-CM | POA: Diagnosis not present

## 2024-03-07 DIAGNOSIS — M25562 Pain in left knee: Secondary | ICD-10-CM | POA: Diagnosis not present

## 2024-03-07 DIAGNOSIS — R2689 Other abnormalities of gait and mobility: Secondary | ICD-10-CM | POA: Diagnosis not present

## 2024-03-09 DIAGNOSIS — R2689 Other abnormalities of gait and mobility: Secondary | ICD-10-CM | POA: Diagnosis not present

## 2024-03-09 DIAGNOSIS — M25562 Pain in left knee: Secondary | ICD-10-CM | POA: Diagnosis not present

## 2024-03-10 ENCOUNTER — Other Ambulatory Visit: Payer: Self-pay | Admitting: Cardiology

## 2024-03-12 DIAGNOSIS — R2689 Other abnormalities of gait and mobility: Secondary | ICD-10-CM | POA: Diagnosis not present

## 2024-03-12 DIAGNOSIS — M25562 Pain in left knee: Secondary | ICD-10-CM | POA: Diagnosis not present

## 2024-03-14 DIAGNOSIS — M25562 Pain in left knee: Secondary | ICD-10-CM | POA: Diagnosis not present

## 2024-03-14 DIAGNOSIS — R2689 Other abnormalities of gait and mobility: Secondary | ICD-10-CM | POA: Diagnosis not present

## 2024-03-16 DIAGNOSIS — R2689 Other abnormalities of gait and mobility: Secondary | ICD-10-CM | POA: Diagnosis not present

## 2024-03-16 DIAGNOSIS — M25562 Pain in left knee: Secondary | ICD-10-CM | POA: Diagnosis not present

## 2024-03-19 DIAGNOSIS — M25562 Pain in left knee: Secondary | ICD-10-CM | POA: Diagnosis not present

## 2024-03-19 DIAGNOSIS — R2689 Other abnormalities of gait and mobility: Secondary | ICD-10-CM | POA: Diagnosis not present

## 2024-03-21 DIAGNOSIS — R2689 Other abnormalities of gait and mobility: Secondary | ICD-10-CM | POA: Diagnosis not present

## 2024-03-21 DIAGNOSIS — M25562 Pain in left knee: Secondary | ICD-10-CM | POA: Diagnosis not present

## 2024-03-23 DIAGNOSIS — R2689 Other abnormalities of gait and mobility: Secondary | ICD-10-CM | POA: Diagnosis not present

## 2024-03-23 DIAGNOSIS — M25562 Pain in left knee: Secondary | ICD-10-CM | POA: Diagnosis not present

## 2024-04-01 ENCOUNTER — Encounter (HOSPITAL_COMMUNITY): Payer: Self-pay

## 2024-04-01 ENCOUNTER — Telehealth: Payer: Self-pay | Admitting: Student

## 2024-04-01 ENCOUNTER — Emergency Department (HOSPITAL_COMMUNITY)
Admission: EM | Admit: 2024-04-01 | Discharge: 2024-04-01 | Disposition: A | Discharge status: Home / Self Care | Attending: Emergency Medicine | Admitting: Emergency Medicine

## 2024-04-01 ENCOUNTER — Other Ambulatory Visit: Payer: Self-pay

## 2024-04-01 ENCOUNTER — Emergency Department (HOSPITAL_COMMUNITY): Admit: 2024-04-01 | Discharge: 2024-04-01 | Disposition: A | Discharge status: Home / Self Care

## 2024-04-01 ENCOUNTER — Emergency Department (HOSPITAL_COMMUNITY)

## 2024-04-01 DIAGNOSIS — L03116 Cellulitis of left lower limb: Secondary | ICD-10-CM | POA: Insufficient documentation

## 2024-04-01 DIAGNOSIS — Z79899 Other long term (current) drug therapy: Secondary | ICD-10-CM | POA: Diagnosis not present

## 2024-04-01 DIAGNOSIS — I5022 Chronic systolic (congestive) heart failure: Secondary | ICD-10-CM | POA: Diagnosis not present

## 2024-04-01 DIAGNOSIS — I110 Hypertensive heart disease with heart failure: Secondary | ICD-10-CM | POA: Insufficient documentation

## 2024-04-01 DIAGNOSIS — Z7901 Long term (current) use of anticoagulants: Secondary | ICD-10-CM | POA: Diagnosis not present

## 2024-04-01 DIAGNOSIS — I2510 Atherosclerotic heart disease of native coronary artery without angina pectoris: Secondary | ICD-10-CM | POA: Diagnosis not present

## 2024-04-01 DIAGNOSIS — M7989 Other specified soft tissue disorders: Secondary | ICD-10-CM

## 2024-04-01 DIAGNOSIS — Z85828 Personal history of other malignant neoplasm of skin: Secondary | ICD-10-CM | POA: Insufficient documentation

## 2024-04-01 DIAGNOSIS — Z96652 Presence of left artificial knee joint: Secondary | ICD-10-CM | POA: Diagnosis not present

## 2024-04-01 LAB — CBC WITH DIFFERENTIAL/PLATELET
Abs Immature Granulocytes: 0.03 K/uL (ref 0.00–0.07)
Basophils Absolute: 0.1 K/uL (ref 0.0–0.1)
Basophils Relative: 0 %
Eosinophils Absolute: 0.6 K/uL — ABNORMAL HIGH (ref 0.0–0.5)
Eosinophils Relative: 5 %
HCT: 41.1 % (ref 36.0–46.0)
Hemoglobin: 13 g/dL (ref 12.0–15.0)
Immature Granulocytes: 0 %
Lymphocytes Relative: 31 %
Lymphs Abs: 3.8 K/uL (ref 0.7–4.0)
MCH: 29.3 pg (ref 26.0–34.0)
MCHC: 31.6 g/dL (ref 30.0–36.0)
MCV: 92.6 fL (ref 80.0–100.0)
Monocytes Absolute: 0.9 K/uL (ref 0.1–1.0)
Monocytes Relative: 7 %
Neutro Abs: 6.8 K/uL (ref 1.7–7.7)
Neutrophils Relative %: 57 %
Platelets: 323 K/uL (ref 150–400)
RBC: 4.44 MIL/uL (ref 3.87–5.11)
RDW: 13.8 % (ref 11.5–15.5)
WBC: 12.1 K/uL — ABNORMAL HIGH (ref 4.0–10.5)
nRBC: 0 % (ref 0.0–0.2)

## 2024-04-01 LAB — COMPREHENSIVE METABOLIC PANEL WITH GFR
ALT: 13 U/L (ref 0–44)
AST: 24 U/L (ref 15–41)
Albumin: 3.7 g/dL (ref 3.5–5.0)
Alkaline Phosphatase: 102 U/L (ref 38–126)
Anion gap: 9 (ref 5–15)
BUN: 14 mg/dL (ref 8–23)
CO2: 22 mmol/L (ref 22–32)
Calcium: 8.1 mg/dL — ABNORMAL LOW (ref 8.9–10.3)
Chloride: 111 mmol/L (ref 98–111)
Creatinine, Ser: 1.02 mg/dL — ABNORMAL HIGH (ref 0.44–1.00)
GFR, Estimated: 57 mL/min — ABNORMAL LOW (ref >60)
Glucose, Bld: 86 mg/dL (ref 70–99)
Potassium: 3.4 mmol/L — ABNORMAL LOW (ref 3.5–5.1)
Sodium: 142 mmol/L (ref 135–145)
Total Bilirubin: 0.3 mg/dL (ref 0.0–1.2)
Total Protein: 7.3 g/dL (ref 6.5–8.1)

## 2024-04-01 LAB — I-STAT CG4 LACTIC ACID, ED: Lactic Acid, Venous: 0.9 mmol/L (ref 0.5–1.9)

## 2024-04-01 LAB — SEDIMENTATION RATE: Sed Rate: 7 mm/h (ref 0–22)

## 2024-04-01 LAB — C-REACTIVE PROTEIN: CRP: 0.5 mg/dL (ref <1.0)

## 2024-04-01 MED ORDER — SODIUM CHLORIDE 0.9 % IV SOLN
1.0000 g | Freq: Once | INTRAVENOUS | Status: AC
  Administered 2024-04-01: 1 g via INTRAVENOUS
  Filled 2024-04-01: qty 10

## 2024-04-01 MED ORDER — CEFADROXIL 500 MG PO CAPS
500.0000 mg | ORAL_CAPSULE | Freq: Two times a day (BID) | ORAL | 0 refills | Status: AC
Start: 2024-04-01 — End: 2024-04-08

## 2024-04-01 NOTE — Progress Notes (Signed)
 Left lower extremity venous duplex has been completed. Preliminary results can be found in CV Proc through chart review.  Results were given to Lavanda Lesches PA.  04/01/24 3:58 PM Cathlyn Collet RVT

## 2024-04-01 NOTE — Discharge Instructions (Signed)
 ### Cellulitis Treatment Information     **What is cellulitis?**      Cellulitis is a common skin infection caused by bacteria, usually streptococci or staphylococci. It can cause redness, swelling, warmth, and pain in the affected area. Sometimes, fever or chills may occur.      **Why is cefadroxil  prescribed?**      Cefadroxil  is an antibiotic used to treat skin infections like cellulitis when they are caused by bacteria that respond to this medicine. It works by stopping the growth of bacteria.[1]      **How should cefadroxil  be taken?**      - Take cefadroxil  exactly as prescribed, even if symptoms improve before finishing the medication.      - It can be taken with or without food. Taking it with food may help prevent stomach upset.[1][2]      - For adults, the usual dose for skin infections is 1 gram per day, either as a single dose or split into two doses (morning and evening).[1][2]         - If you have kidney problems, your doctor may adjust the dose.[1][2]      **How long is treatment?**      Treatment usually lasts 5 to 10 days, depending on how quickly symptoms improve. It is important to finish the full course, even if you feel better.[3][1][2]      **Important things to know:**      - Cefadroxil  only treats bacterial infections. It will not help with viral infections like the common cold.[1]      - Skipping doses or stopping early can make the infection harder to treat and may lead to antibiotic resistance.[1]      - Diarrhea is a common side effect and usually goes away after stopping the medicine. If you develop severe diarrhea with blood or stomach cramps, contact your doctor right away.[1]      - Let your doctor know if you have kidney problems or a history of stomach or bowel disease, especially colitis.[1]      - Rarely, allergic reactions can occur. Seek medical help if you develop rash, itching, swelling, or trouble breathing.      **When to call your  doctor:**      - If your symptoms do not improve after a few days.      - If you develop severe diarrhea, especially with blood.      - If you have any signs of an allergic reaction.      **Preventing resistance:**      Only use antibiotics when prescribed for a proven or strongly suspected bacterial infection. Do not share your medicine with others or use leftover antibiotics.[1]        Contact a health care provider if: You have a fever. Your symptoms do not improve within 1-2 days of starting treatment or you develop new symptoms. Your bone or joint underneath the infected area becomes painful after the skin has healed. Your infection returns in the same area or another area. Signs of this may include: You notice a swollen bump in the infected area. Your red area gets larger, turns dark in color, or becomes more painful. Drainage increases. Pus or a bad smell develops in your infected area. You have more pain. You feel ill and have muscle aches and weakness. You develop vomiting or diarrhea that will not go away. Get help right away if: You notice red streaks coming from the infected area. You  notice the skin turns purple or black and falls off. This symptom may be an emergency. Get help right away. Call 911.              ### References  1. Cefadroxil . Food and Drug Administration. Updated date: 2023-08-15. 2. CEFADROXIL . Food and Drug Administration. Updated date: 2009-03-26. 3. Practice Guidelines for the Diagnosis and Management of Skin and Soft Tissue Infections: 2014 Update by the Infectious Diseases Society of [place]. Mannie PONCHO, Bisno AL, Chambers HF, et al. Clinical Infectious Diseases : An Official Publication of the Infectious Diseases Society of [place]. 2014;59(2):147-59. doi:10.1093/cid/ciu296.

## 2024-04-01 NOTE — ED Provider Notes (Signed)
 [place] EMERGENCY DEPARTMENT AT [facility] Provider Note   CSN: [id number] Arrival date & time: 04/01/24  1509     Patient presents with: Post-op Problem   Hannah Vasquez is a 73 y.o. female who presents emergency department chief complaint of left leg swelling and knee pain.  Patient is almost 6 weeks out from a left total knee arthroplasty with Dr. Melodi.  Patient states that today she noticed that her knee seemed more painful than usual.  She has been ambulatory on the knee.  She denies fevers chills chest pain or shortness of breath.  Her granddaughter came over and also noted that her left lower extremity was swollen.  Her daughter called her cardiologist because she states that she knows they generally check her ankles for swelling.  When she got to the house she noted that the patient only had swelling in the left leg and some redness.  She had a discussion with the on-call practitioner who told her they needed to come to the emergency department to rule out DVT or infection.   HPI     Prior to Admission medications   Medication Sig Start Date End Date Taking? Authorizing Provider  cefadroxil  (DURICEF) 500 MG capsule Take 1 capsule (500 mg total) by mouth 2 (two) times daily for 7 days. 04/01/24 04/08/24 Yes Kaelea Gathright, PA-C  acetaminophen  (TYLENOL ) 500 MG tablet Take 500 mg by mouth every 6 (six) hours as needed for moderate pain.    [provider]  albuterol  (VENTOLIN  HFA) 108 (90 Base) MCG/ACT inhaler Inhale 1 puff into the lungs every 6 (six) hours as needed for wheezing or shortness of breath. 12/12/19   Velma Raisin, MD  amLODipine  (NORVASC ) 5 MG tablet TAKE ONE TABLET BY MOUTH ONCE A DAY 03/12/24   Jeffrie Oneil BROCKS, MD  atorvastatin  (LIPITOR ) 80 MG tablet TAKE ONE TABLET BY MOUTH DAILY AT SIX IN THE EVENING 05/09/23   Jeffrie Oneil BROCKS, MD  fluticasone  (FLONASE ) 50 MCG/ACT nasal spray Place 1 spray into both nostrils daily as needed for allergies.     [provider]  furosemide  (LASIX ) 20 MG tablet Take 1 tablet (20 mg total) by mouth daily as needed. 01/01/19   Jeffrie Oneil BROCKS, MD  gabapentin  (NEURONTIN ) 300 MG capsule Take 1 capsule (300 mg total) by mouth 3 (three) times daily. Patient taking differently: Take 300-600 mg by mouth See admin instructions. 300 mg twice daily, 600 mg at bedtime 05/21/21   Velma Raisin, MD  isosorbide  mononitrate (IMDUR ) 30 MG 24 hr tablet Take 1 tablet (30 mg total) by mouth daily. 02/01/24   Jeffrie Oneil BROCKS, MD  loratadine  (CLARITIN ) 10 MG tablet Take 1 tablet (10 mg total) by mouth every morning. 11/08/13   Aron, Talia M, MD  losartan  (COZAAR ) 100 MG tablet TAKE ONE TABLET BY MOUTH ONCE A DAY 03/12/24   Jeffrie Oneil BROCKS, MD  methocarbamol  (ROBAXIN ) 500 MG tablet Take 1 tablet (500 mg total) by mouth every 6 (six) hours as needed for muscle spasms. 02/21/24   Edmisten, Roxie CROME, PA  metoprolol  succinate (TOPROL -XL) 25 MG 24 hr tablet TAKE ONE TABLET BY MOUTH ONCE A DAY 05/09/23   Jeffrie Oneil BROCKS, MD  nitroGLYCERIN  (NITROSTAT ) 0.4 MG SL tablet DISSOLVE ONE TABLET UNDER TONGUE AS NEEDED FOR CHEST PAIN. MAY REPEAT FIVE MINUTES APART THREE TIMES IF NEEDED 04/27/23   Jeffrie Oneil BROCKS, MD  omeprazole  (PRILOSEC) 40 MG capsule TAKE ONE CAPSULE (40 MG TOTAL) BY MOUTH  DAILY. 01/17/24   Pyrtle, Gordy HERO, MD  ondansetron  (ZOFRAN ) 4 MG tablet Take 1 tablet (4 mg total) by mouth every 6 (six) hours as needed for nausea. 02/21/24   Edmisten, Roxie CROME, PA  oxyCODONE  (OXY IR/ROXICODONE ) 5 MG immediate release tablet Take 1-2 tablets (5-10 mg total) by mouth every 6 (six) hours as needed for severe pain (pain score 7-10). 02/21/24   Edmisten, Kristie L, PA  potassium chloride  SA (KLOR-CON  M) 20 MEQ tablet TAKE TWO TABLETS BY MOUTH TWICE DAILY Patient taking differently: Take 40 mEq by mouth daily. 11/07/23   Jeffrie Oneil BROCKS, MD  rivaroxaban  (XARELTO ) 10 MG TABS tablet Take 1 tablet (10 mg total) by mouth daily with breakfast. 02/22/24   Edmisten,  Kristie L, PA  spironolactone  (ALDACTONE ) 25 MG tablet TAKE ONE TABLET (25 MG TOTAL) BY MOUTH DAILY. (PLEASE CALL TO SCHEDULE ANNUAL APPT FOR MORE REFILLS) 03/12/24   Jeffrie Oneil BROCKS, MD  traMADol  (ULTRAM ) 50 MG tablet Take 1 tablet (50 mg total) by mouth every 6 (six) hours as needed for moderate pain (pain score 4-6). 02/21/24   Edmisten, Kristie L, PA  triamcinolone  cream (KENALOG ) 0.1 % Apply 1 Application topically 2 (two) times daily as needed. 03/25/23   Arloa Suzen RAMAN, NP    Allergies: Promethazine -dm, Bactroban  [mupirocin  calcium ], Doxycycline , Codeine, Mupirocin , Valtrex  [valacyclovir  hcl], and Penicillins    Review of Systems  Updated Vital Signs BP 132/65   Pulse 60   Temp 97.8 F (36.6 C) (Oral)   Resp 18   Ht 5' 1 (1.549 m)   Wt 70.3 kg   LMP  (LMP Unknown)   SpO2 96%   BMI 29.29 kg/m   Physical Exam Vitals and nursing note reviewed.  Constitutional:      General: She is not in acute distress.    Appearance: She is well-developed. She is not diaphoretic.  HENT:     Head: Normocephalic and atraumatic.     Right Ear: External ear normal.     Left Ear: External ear normal.     Nose: Nose normal.     Mouth/Throat:     Mouth: Mucous membranes are moist.  Eyes:     General: No scleral icterus.    Conjunctiva/sclera: Conjunctivae normal.  Cardiovascular:     Rate and Rhythm: Normal rate and regular rhythm.     Heart sounds: Normal heart sounds. No murmur heard.    No friction rub. No gallop.  Pulmonary:     Effort: Pulmonary effort is normal. No respiratory distress.     Breath sounds: Normal breath sounds.  Abdominal:     General: Bowel sounds are normal. There is no distension.     Palpations: Abdomen is soft. There is no mass.     Tenderness: There is no abdominal tenderness. There is no guarding.  Musculoskeletal:     Cervical back: Normal range of motion.     Comments: Left lower extremity with circumferential swelling of the leg from the knee down.  The  left knee has a well-healing midline surgical incision which appears to be well-healing without evidence of serosanguineous or purulent discharge.  Patient is able to flex and extend the knee but has some stiffness and swelling so range of motion is somewhat limited.  There is pitting edema in the left ankle without tenderness.  Normal left ankle and hip examination.  Mild erythema noted in the left lower extremity.  Calf is soft and nontender  Skin:  General: Skin is warm and dry.  Neurological:     Mental Status: She is alert and oriented to person, place, and time.  Psychiatric:        Behavior: Behavior normal.     (all labs ordered are listed, but only abnormal results are displayed) Labs Reviewed  COMPREHENSIVE METABOLIC PANEL WITH GFR - Abnormal; Notable for the following components:      Result Value   Potassium 3.4 (*)    Creatinine, Ser 1.02 (*)    Calcium  8.1 (*)    GFR, Estimated 57 (*)    All other components within normal limits  CBC WITH DIFFERENTIAL/PLATELET - Abnormal; Notable for the following components:   WBC 12.1 (*)    Eosinophils Absolute 0.6 (*)    All other components within normal limits  CULTURE, BLOOD (ROUTINE X 2)  CULTURE, BLOOD (ROUTINE X 2)  SEDIMENTATION RATE  C-REACTIVE PROTEIN  I-STAT CG4 LACTIC ACID, ED    EKG: None  Radiology: DG Knee Complete 4 Views Left Result Date: 04/01/2024 CLINICAL DATA:  Postop swelling and redness. Knee replacement 02/20/2024. EXAM: LEFT KNEE - COMPLETE 4+ VIEW COMPARISON:  None Available. FINDINGS: Total knee arthroplasty. Mild soft tissues swelling around the patella. No effusion demonstrated. IMPRESSION: 1. Mild soft tissue swelling in the knee.  No effusion demonstrated 2. No complication of the prosthetic components. Electronically Signed   By: Jackquline Boxer M.D.   On: 04/01/2024 16:22   VAS US  LOWER EXTREMITY VENOUS (DVT) (ONLY [facility] & [facility]) Result Date: 04/01/2024  Lower Venous DVT Study Patient Name:  SUZI HERNAN  Date of Exam:   04/01/2024 Medical Rec #: [id number]      Accession #:    [id number] Date of Birth: 1951-06-08      Patient Gender: F Patient Age:   40 years Exam Location:  [facility] Procedure:      VAS US  LOWER EXTREMITY VENOUS (DVT) Referring Phys: Monifah Freehling --------------------------------------------------------------------------------  Indications: Swelling.  Risk Factors: Surgery. Comparison Study: No prior studies. Performing Technologist: Cordella Collet RVT  Examination Guidelines: A complete evaluation includes B-mode imaging, spectral Doppler, color Doppler, and power Doppler as needed of all accessible portions of each vessel. Bilateral testing is considered an integral part of a complete examination. Limited examinations for reoccurring indications may be performed as noted. The reflux portion of the exam is performed with the patient in reverse Trendelenburg.  +-----+---------------+---------+-----------+----------+--------------+ RIGHTCompressibilityPhasicitySpontaneityPropertiesThrombus Aging +-----+---------------+---------+-----------+----------+--------------+ CFV  Full           Yes      Yes                                 +-----+---------------+---------+-----------+----------+--------------+   +---------+---------------+---------+-----------+----------+--------------+ LEFT     CompressibilityPhasicitySpontaneityPropertiesThrombus Aging +---------+---------------+---------+-----------+----------+--------------+ CFV      Full           Yes      Yes                                 +---------+---------------+---------+-----------+----------+--------------+ SFJ      Full                                                        +---------+---------------+---------+-----------+----------+--------------+  FV Prox  Full                                                         +---------+---------------+---------+-----------+----------+--------------+ FV Mid   Full                                                        +---------+---------------+---------+-----------+----------+--------------+ FV DistalFull                                                        +---------+---------------+---------+-----------+----------+--------------+ PFV      Full                                                        +---------+---------------+---------+-----------+----------+--------------+ POP      Full           Yes      Yes                                 +---------+---------------+---------+-----------+----------+--------------+ PTV      Full                                                        +---------+---------------+---------+-----------+----------+--------------+ PERO     Full                                                        +---------+---------------+---------+-----------+----------+--------------+     Summary: RIGHT: - No evidence of common femoral vein obstruction.   LEFT: - There is no evidence of deep vein thrombosis in the lower extremity.  - No cystic structure found in the popliteal fossa.  *See table(s) above for measurements and observations.    Preliminary      Procedures   Medications Ordered in the ED  cefTRIAXone  (ROCEPHIN ) 1 g in sodium chloride  0.9 % 100 mL IVPB (0 g Intravenous Stopped 04/01/24 1928)    Clinical Course as of 04/01/24 2310  Sun Apr 01, 2024  1547 WBC(!): 12.1 [AH]  1828 Case discussed with Dr. Ernie who agrees with plan asked that we put her on cefadroxil  and have her follow-up in clinic this coming Tuesday Dr. Melodi [AH]    Clinical Course User Index [AH] Corey Laski, PA-C  Medical Decision Making This patient presents to the ED for concern of LLE swelling and erythema, this involves an extensive number of treatment options, and is a complaint that  carries with it a high risk of complications and morbidity.    Differential diagnosis includes cellulitis, DVT, less likely septic joint based on physical examination, traumatic injury.   Co morbidities:  has a past medical history of Advanced hepatic fibrosis, Allergy, Anxiety, Arthritis, CAD (coronary artery disease), Cataract, Chronic systolic heart failure (HCC), DDD (degenerative disc disease), thoracolumbar, Does use hearing aid, Elevated alkaline phosphatase level, Gallstones, GERD (gastroesophageal reflux disease), Hemorrhoids, Hepatic steatosis, History of kidney stones, History of MI (myocardial infarction) (10/07/2017), Hypertension, Mitral valve prolapse, Myocardial infarction (HCC), NASH (nonalcoholic steatohepatitis), OSA (obstructive sleep apnea), Osteoporosis, RBBB, Sarcoidosis, Skin cancer, and Status post dilation of esophageal narrowing.   Social Determinants of Health:        SDOH Screenings Food Insecurity: No Food Insecurity (02/20/2024) Housing: Low Risk  (02/20/2024) Transportation Needs: No Transportation Needs (02/20/2024) Utilities: Not At Risk (02/20/2024) Alcohol Screen: Low Risk  (11/11/2020) Depression (PHQ2-9): Medium Risk (12/08/2021) Financial Resource Strain: Low Risk  (10/04/2023)     Received from [facility] Physical Activity: Insufficiently Active (11/11/2020) Social Connections: Unknown (02/20/2024) Stress: No Stress Concern Present (11/11/2020) Tobacco Use: Low Risk  (04/01/2024)   Additional history:  {Additional history obtained from patient's daughter at bedside   Lab Tests:  I Ordered, and personally interpreted labs.  The pertinent results include:   I reviewed the patient's labs White blood cell count 12.1, Creatinine 1.02, Lactic acid 0.9  Imaging Studies:  I ordered imaging studies including vascular ultrasound and left knee x-ray I independently visualized and interpreted imaging which showed no acute findings I agree  with the radiologist interpretation  Cardiac Monitoring/ECG:       The patient was maintained on a cardiac monitor.  I personally viewed and interpreted the cardiac monitored which showed an underlying rhythm of:   Medicines ordered and prescription drug management:  I ordered medication including Medications cefTRIAXone  (ROCEPHIN ) 1 g in sodium chloride  0.9 % 100 mL IVPB (0 g Intravenous Stopped 04/01/24 1928) for cellulitis  I have reviewed the patients home medicines and have made adjustments as needed  Test Considered:       I considered CT angiogram of the chest however has no DVT and has no chest chest pain or shortness of breath  Critical Interventions:         Consultations Obtained: Case discussed with Dr. Ernie as per ED course  Problem List / ED Course:       (L03.116) Cellulitis of left lower extremity  (primary encounter diagnosis)   MDM: With left lower extremity swelling after a left total knee arthroplasty about 5 and half weeks ago.  I considered DVT but she is negative after ultrasound, I considered septic joint however patient has no pain with movement of the joint and no obvious effusion.  She does not appear to have an infected surgical wound.  She has a bit of erythema and swelling of the left lower extremity I suspect she has some cellulitis.  Patient will be discharged on cefadroxil  she has very close orthopedic follow-up.  She appears otherwise appropriate for discharge at this time is afebrile and stable.   Dispostion:  After consideration of the diagnostic results and the patients response to treatment, I feel that the patent would benefit from discharge    Amount and/or Complexity of Data  Reviewed Labs: ordered. Decision-making details documented in ED Course. Radiology: ordered.  Risk Prescription drug management.        Final diagnoses:  Cellulitis of left lower extremity    ED Discharge Orders          Ordered    cefadroxil   (DURICEF) 500 MG capsule  2 times daily        04/01/24 1830               Arloa Chroman, PA-C 04/01/24 2325    Dean Clarity, MD 04/04/24 [phone]

## 2024-04-01 NOTE — ED Triage Notes (Signed)
 Patient has knee replacement on 7/7 and noticed the last few days the knee has been hurting more but today noticed swelling down to ankle and some red streaking.  Cards wants blood clot rule out and infection.

## 2024-04-01 NOTE — Telephone Encounter (Signed)
   Patient's daughter called our office with concerns about left leg swelling.  Called and spoke with daughter Alberta (okay per DPR).  Patient recently had a left knee replacement on 02/20/2024 and today they noticed new left ankle swelling that extends up to the calf.  Daughter was planning on having patient seen in the [facility]o Urgent care but wanted to make sure was not a cardiac issue as well.  She denies any chest pain, shortness of breath, orthopnea, or PND.  She was on Xarelto  for about a month after her knee replacement but this was stopped 1 week ago.  Does not sound like CHF.  With recent surgery more concerned that it could be a DVT.  While on the phone, they got a callback from the [facility]o Urgent Care who said they would not be able to see her today.  Therefore, daughters plan to take her to [facility] to be evaluated.  Agree with this.  Ama Mcmaster E Khalani Novoa, PA-C 04/01/2024 2:35 PM

## 2024-04-02 ENCOUNTER — Other Ambulatory Visit: Payer: Self-pay | Admitting: Cardiology

## 2024-04-02 DIAGNOSIS — M25562 Pain in left knee: Secondary | ICD-10-CM | POA: Diagnosis not present

## 2024-04-02 DIAGNOSIS — R42 Dizziness and giddiness: Secondary | ICD-10-CM | POA: Diagnosis not present

## 2024-04-02 DIAGNOSIS — L03116 Cellulitis of left lower limb: Secondary | ICD-10-CM | POA: Diagnosis not present

## 2024-04-02 DIAGNOSIS — R634 Abnormal weight loss: Secondary | ICD-10-CM | POA: Diagnosis not present

## 2024-04-02 DIAGNOSIS — R110 Nausea: Secondary | ICD-10-CM | POA: Diagnosis not present

## 2024-04-03 DIAGNOSIS — M25562 Pain in left knee: Secondary | ICD-10-CM | POA: Diagnosis not present

## 2024-04-03 DIAGNOSIS — R2689 Other abnormalities of gait and mobility: Secondary | ICD-10-CM | POA: Diagnosis not present

## 2024-04-06 LAB — CULTURE, BLOOD (ROUTINE X 2)
Culture: NO GROWTH
Culture: NO GROWTH
Special Requests: ADEQUATE
Special Requests: ADEQUATE

## 2024-05-03 DIAGNOSIS — J4540 Moderate persistent asthma, uncomplicated: Secondary | ICD-10-CM | POA: Diagnosis not present

## 2024-05-03 DIAGNOSIS — G4733 Obstructive sleep apnea (adult) (pediatric): Secondary | ICD-10-CM | POA: Diagnosis not present

## 2024-05-03 DIAGNOSIS — K219 Gastro-esophageal reflux disease without esophagitis: Secondary | ICD-10-CM | POA: Diagnosis not present

## 2024-05-08 ENCOUNTER — Ambulatory Visit
Admission: RE | Admit: 2024-05-08 | Discharge: 2024-05-08 | Disposition: A | Discharge status: Home / Self Care | Payer: Self-pay | Source: Ambulatory Visit | Attending: Emergency Medicine | Admitting: Emergency Medicine

## 2024-05-08 VITALS — BP 115/79 | HR 90 | Temp 98.3°F | Resp 18

## 2024-05-08 DIAGNOSIS — B349 Viral infection, unspecified: Secondary | ICD-10-CM

## 2024-05-08 LAB — POC SOFIA SARS ANTIGEN FIA: SARS Coronavirus 2 Ag: POSITIVE — AB

## 2024-05-08 MED ORDER — ONDANSETRON 4 MG PO TBDP: 4.0000 mg | ORAL_TABLET | Freq: Three times a day (TID) | ORAL | 0 refills | Status: AC | PRN

## 2024-05-08 MED ORDER — LOPERAMIDE HCL 2 MG PO CAPS: 2.0000 mg | ORAL_CAPSULE | Freq: Four times a day (QID) | ORAL | 0 refills | Status: AC | PRN

## 2024-05-08 MED ORDER — MOLNUPIRAVIR EUA 200MG CAPSULE: 4.0000 | ORAL_CAPSULE | Freq: Two times a day (BID) | ORAL | 0 refills | Status: AC

## 2024-05-08 MED ORDER — BENZONATATE 100 MG PO CAPS: 100.0000 mg | ORAL_CAPSULE | Freq: Three times a day (TID) | ORAL | 0 refills | Status: DC

## 2024-05-08 NOTE — ED Triage Notes (Signed)
 Patient to Urgent Care with complaints of nausea/ weakness/ cough/ body aches/ upset stomach/ headaches.   Symptoms started yesterday.   Taking cold and sinus relief.

## 2024-05-08 NOTE — Discharge Instructions (Signed)
 Covid 19 is a virus and should steadily improve in time it can take up to 7 to 10 days before you truly start to see a turnaround however things will get better    Per the CDC you will need to quarantine and to your 24 hours without fever, if no fever may continue activity wearing mask  Begin use of antiviral taking twice daily for 5 days to help reduce amount of virus in your body which minimizes symptoms, will not fully take away your illness  May use Zofran  every 8 hours to help with nausea, placed under tongue and allow to dissolve  May use Imodium  every 6 hours as needed for diarrhea  You may use Tessalon  pill every 8 hours as needed for coughing  You can take Tylenol  and/or Ibuprofen as needed for fever reduction and pain relief.   For cough: honey 1/2 to 1 teaspoon (you can dilute the honey in water  or another fluid).  You can also use guaifenesin and dextromethorphan for cough. You can use a humidifier for chest congestion and cough.  If you don't have a humidifier, you can sit in the bathroom with the hot shower running.      For sore throat: try warm salt water  gargles, cepacol lozenges, throat spray, warm tea or water  with lemon/honey, popsicles or ice, or OTC cold relief medicine for throat discomfort.   For congestion: take a daily anti-histamine like Zyrtec, Claritin , and a oral decongestant, such as pseudoephedrine.  You can also use Flonase  1-2 sprays in each nostril daily.   It is important to stay hydrated: drink plenty of fluids (water , gatorade/powerade/pedialyte, juices, or teas) to keep your throat moisturized and help further relieve irritation/discomfort.

## 2024-05-08 NOTE — ED Provider Notes (Signed)
 Hannah Vasquez    CSN: [id number] Arrival date & time: 05/08/24  9063      History   Chief Complaint Chief Complaint  Patient presents with   Nausea    Nausea, weak, Lightheaded and cough - Entered by patient    HPI Hannah Vasquez is a 73 y.o. female.   Patient presents for evaluation of subjective fever, body aches, intermittent headaches with lightheadedness, productive cough, nasal congestion, nausea without vomiting, sore throat, pain behind the bilateral ears and watery diarrhea beginning 1 day ago.  No known sick contacts.  Decreased appetite with difficulty tolerating both food and fluids.  Has attempted use of Tylenol  and an over-the-counter cold and sinus medicine.  Denies shortness of breath and wheezing.  Past Medical History:  Diagnosis Date   Advanced hepatic fibrosis    Allergy    Anxiety    Arthritis    CAD (coronary artery disease)    Cataract    Chronic systolic heart failure (HCC)    DDD (degenerative disc disease), thoracolumbar    Does use hearing aid    bilateral as of 10/05/2019   Elevated alkaline phosphatase level    Gallstones    GERD (gastroesophageal reflux disease)    Hemorrhoids    Hepatic steatosis    History of kidney stones    History of MI (myocardial infarction) 10/07/2017   Hypertension    Mitral valve prolapse    Myocardial infarction (HCC)    NASH (nonalcoholic steatohepatitis)    OSA (obstructive sleep apnea)    Osteoporosis    RBBB    Sarcoidosis    Skin cancer    Face   Status post dilation of esophageal narrowing     Patient Active Problem List   Diagnosis Date Noted   OA (osteoarthritis) of knee 02/20/2024   Left-sided face pain 11/18/2021   Dizziness, nonspecific 11/03/2021   Decreased grip strength of left hand 11/03/2021   Sarcoidosis 09/10/2021   Thyroid  nodule 09/10/2021   Urinary urgency 05/21/2021   Vitamin B12 deficiency 01/29/2021   Cough 07/29/2020   OSA (obstructive sleep apnea) 06/25/2020    Early satiety 04/29/2020   Upper abdominal pain 04/29/2020   Nausea without vomiting 04/29/2020   Prediabetes 03/25/2020   Nocturia 03/25/2020   Insomnia 03/25/2020   Reactive airway disease 12/12/2019   Neuropathy 12/12/2019   Chronic systolic heart failure (HCC) 07/11/2019   Elevated serum alkaline phosphatase level 07/10/2019   Leukocytosis 07/10/2019   Bilateral hand pain 04/09/2019   Bilateral hearing loss 04/09/2019   Trigger finger, left middle finger 10/30/2018   Lumbar back pain with radiculopathy affecting lower extremity 05/22/2018   Osteopenia 05/15/2018   Lumbar radiculopathy 04/27/2018   GERD (gastroesophageal reflux disease) 04/10/2018   Shortness of breath 08/21/2017   DDD (degenerative disc disease), thoracolumbar 04/28/2017   Back pain 04/21/2017   Hypokalemia 01/06/2017   History of non-ST elevation myocardial infarction (NSTEMI) 01/01/2017   Fatigue 11/27/2014   RBBB 12/19/2013   Mitral valve prolapse    HYPERTENSION, BENIGN 08/27/2010   Anxiety state 06/27/2009   Coronary atherosclerosis 06/27/2009   Mitral valve disorder 06/27/2009   Hemorrhoids 06/27/2009   Allergic rhinitis 06/27/2009   Disorder of bone and cartilage 11/11/2006    Past Surgical History:  Procedure Laterality Date   BASAL CELL CARCINOMA EXCISION  02/05/2016   Dr. Jadine, [facility]   BLADDER SURGERY  11/15/1995   bladder neck suspension, urinary incontinence   CARDIAC CATHETERIZATION  01/03/2017 DES  LAD   CATARACT EXTRACTION Left 04/30/2016   Dr. Milan, [facility]   CORONARY STENT INTERVENTION N/A 01/03/2017   Procedure: Coronary Stent Intervention;  Surgeon: Mady Bruckner, MD;  Location: [facility];  Service: Cardiovascular;  Laterality: N/A;   CORONARY ULTRASOUND/IVUS N/A 01/03/2017   Procedure: Intravascular Ultrasound/IVUS;  Surgeon: Mady Bruckner, MD;  Location: [facility];  Service: Cardiovascular;  Laterality: N/A;   DENTAL SURGERY   06/20/2016   Tooth Implant; Dr. Ann   KNEE SURGERY  2003   torn meniscus   LEFT HEART CATH AND CORONARY ANGIOGRAPHY N/A 01/03/2017   Procedure: Left Heart Cath and Coronary Angiography;  Surgeon: Mady Bruckner, MD;  Location: [facility];  Service: Cardiovascular;  Laterality: N/A;   MOHS SURGERY Right 2017   right side of face   ROTATOR CUFF REPAIR     SHOULDER SURGERY Right 2005   TOTAL KNEE ARTHROPLASTY Left 02/20/2024   Procedure: ARTHROPLASTY, KNEE, TOTAL;  Surgeon: Melodi Lerner, MD;  Location: [facility];  Service: Orthopedics;  Laterality: Left;    OB History     Gravida  2   Para  2   Term  2   Preterm      AB      Living  2      SAB      IAB      Ectopic      Multiple      Live Births  2            Home Medications    Prior to Admission medications   Medication Sig Start Date End Date Taking? Authorizing Provider  albuterol  (VENTOLIN  HFA) 108 (90 Base) MCG/ACT inhaler Inhale 1 puff into the lungs every 6 (six) hours as needed for wheezing or shortness of breath. 12/12/19  Yes Velma Raisin, MD  amLODipine  (NORVASC ) 5 MG tablet TAKE ONE TABLET BY MOUTH ONCE A DAY 03/12/24  Yes Jeffrie Oneil BROCKS, MD  atorvastatin  (LIPITOR ) 80 MG tablet TAKE ONE TABLET BY MOUTH DAILY AT SIX IN THE EVENING 05/09/23  Yes Jeffrie Oneil BROCKS, MD  benzonatate  (TESSALON ) 100 MG capsule Take 1 capsule (100 mg total) by mouth every 8 (eight) hours. 05/08/24  Yes Gurkaran Rahm R, NP  fluticasone  (FLONASE ) 50 MCG/ACT nasal spray Place 1 spray into both nostrils daily as needed for allergies.   Yes [provider]  isosorbide  mononitrate (IMDUR ) 30 MG 24 hr tablet Take 1 tablet (30 mg total) by mouth daily. 02/01/24  Yes Jeffrie Oneil BROCKS, MD  loperamide  (IMODIUM ) 2 MG capsule Take 1 capsule (2 mg total) by mouth 4 (four) times daily as needed for diarrhea or loose stools. 05/08/24  Yes Jillianne Gamino R, NP  loratadine  (CLARITIN ) 10 MG tablet Take 1 tablet (10 mg total) by  mouth every morning. 11/08/13  Yes Aron, Talia M, MD  losartan  (COZAAR ) 100 MG tablet TAKE ONE TABLET BY MOUTH ONCE A DAY 03/12/24  Yes Jeffrie Oneil BROCKS, MD  metoprolol  succinate (TOPROL -XL) 25 MG 24 hr tablet TAKE ONE TABLET BY MOUTH ONCE A DAY 05/09/23  Yes Jeffrie Oneil BROCKS, MD  molnupiravir  EUA (LAGEVRIO ) 200 mg CAPS capsule Take 4 capsules (800 mg total) by mouth 2 (two) times daily for 5 days. 05/08/24 05/13/24 Yes Luciano Cinquemani R, NP  omeprazole  (PRILOSEC) 40 MG capsule TAKE ONE CAPSULE (40 MG TOTAL) BY MOUTH DAILY. 01/17/24  Yes Pyrtle, Gordy HERO, MD  ondansetron  (ZOFRAN -ODT) 4 MG disintegrating tablet Take 1  tablet (4 mg total) by mouth every 8 (eight) hours as needed. 05/08/24  Yes Chayanne Filippi, Shelba SAUNDERS, NP  rivaroxaban  (XARELTO ) 10 MG TABS tablet Take 1 tablet (10 mg total) by mouth daily with breakfast. 02/22/24  Yes Edmisten, Kristie L, PA  spironolactone  (ALDACTONE ) 25 MG tablet TAKE ONE TABLET (25 MG TOTAL) BY MOUTH DAILY. (PLEASE CALL TO SCHEDULE ANNUAL APPT FOR MORE REFILLS) 03/12/24  Yes Jeffrie Oneil BROCKS, MD  acetaminophen  (TYLENOL ) 500 MG tablet Take 500 mg by mouth every 6 (six) hours as needed for moderate pain.    [provider]  furosemide  (LASIX ) 20 MG tablet Take 1 tablet (20 mg total) by mouth daily as needed. 01/01/19   Jeffrie Oneil BROCKS, MD  gabapentin  (NEURONTIN ) 300 MG capsule Take 1 capsule (300 mg total) by mouth 3 (three) times daily. Patient taking differently: Take 300-600 mg by mouth See admin instructions. 300 mg twice daily, 600 mg at bedtime 05/21/21   Velma Raisin, MD  methocarbamol  (ROBAXIN ) 500 MG tablet Take 1 tablet (500 mg total) by mouth every 6 (six) hours as needed for muscle spasms. 02/21/24   Edmisten, Kristie L, PA  nitroGLYCERIN  (NITROSTAT ) 0.4 MG SL tablet DISSOLVE ONE TABLET UNDER TONGUE AS NEEDED FOR CHEST PAIN. MAY REPEAT FIVE MINUTES APART THREE TIMES IF NEEDED 04/27/23   Jeffrie Oneil BROCKS, MD  ondansetron  (ZOFRAN ) 4 MG tablet Take 1 tablet (4 mg total) by mouth every 6  (six) hours as needed for nausea. 02/21/24   Edmisten, Kristie L, PA  oxyCODONE  (OXY IR/ROXICODONE ) 5 MG immediate release tablet Take 1-2 tablets (5-10 mg total) by mouth every 6 (six) hours as needed for severe pain (pain score 7-10). Patient not taking: Reported on 05/08/2024 02/21/24   Edmisten, Roxie L, PA  potassium chloride  SA (KLOR-CON  M) 20 MEQ tablet TAKE TWO TABLETS BY MOUTH TWICE DAILY Patient taking differently: Take 40 mEq by mouth daily. 11/07/23   Jeffrie Oneil BROCKS, MD  traMADol  (ULTRAM ) 50 MG tablet Take 1 tablet (50 mg total) by mouth every 6 (six) hours as needed for moderate pain (pain score 4-6). Patient not taking: Reported on 05/08/2024 02/21/24   Reena Roxie L, PA  triamcinolone  cream (KENALOG ) 0.1 % Apply 1 Application topically 2 (two) times daily as needed. 03/25/23   Arloa Suzen RAMAN, NP    Family History Family History  Problem Relation Age of Onset   Mitral valve prolapse Mother    Hypertension Mother    Hypertension Father    Brain cancer Father    Colon cancer Neg Hx    Stomach cancer Neg Hx    Esophageal cancer Neg Hx    Pancreatic cancer Neg Hx     Social History Social History   Tobacco Use   Smoking status: Never   Smokeless tobacco: Never  Vaping Use   Vaping status: Never Used  Substance Use Topics   Alcohol use: No    Alcohol/week: 0.0 standard drinks of alcohol   Drug use: No     Allergies   Promethazine -dm, Bactroban  [mupirocin  calcium ], Doxycycline , Codeine, Mupirocin , Valtrex  [valacyclovir  hcl], and Penicillins   Review of Systems Review of Systems   Physical Exam Triage Vital Signs ED Triage Vitals  Encounter Vitals Group     BP 05/08/24 0954 115/79     Girls Systolic BP Percentile --      Girls Diastolic BP Percentile --      Boys Systolic BP Percentile --      Boys Diastolic BP  Percentile --      Pulse Rate 05/08/24 0954 90     Resp 05/08/24 0954 18     Temp 05/08/24 0954 98.3 F (36.8 C)     Temp src --      SpO2  05/08/24 0954 98 %     Weight --      Height --      Head Circumference --      Peak Flow --      Pain Score 05/08/24 0946 6     Pain Loc --      Pain Education --      Exclude from Growth Chart --    No data found.  Updated Vital Signs BP 115/79   Pulse 90   Temp 98.3 F (36.8 C)   Resp 18   LMP  (LMP Unknown)   SpO2 98%   Visual Acuity Right Eye Distance:   Left Eye Distance:   Bilateral Distance:    Right Eye Near:   Left Eye Near:    Bilateral Near:     Physical Exam Constitutional:      Appearance: Normal appearance.  HENT:     Head: Normocephalic.     Right Ear: Tympanic membrane, ear canal and external ear normal.     Left Ear: Tympanic membrane, ear canal and external ear normal.     Nose: Congestion present.     Mouth/Throat:     Mouth: Mucous membranes are moist.     Pharynx: Oropharynx is clear.  Eyes:     Extraocular Movements: Extraocular movements intact.  Cardiovascular:     Rate and Rhythm: Normal rate and regular rhythm.     Pulses: Normal pulses.     Heart sounds: Normal heart sounds.  Pulmonary:     Effort: Pulmonary effort is normal.     Breath sounds: Normal breath sounds.  Musculoskeletal:     Cervical back: Normal range of motion and neck supple.  Neurological:     Mental Status: She is alert and oriented to person, place, and time. Mental status is at baseline.      UC Treatments / Results  Labs (all labs ordered are listed, but only abnormal results are displayed) Labs Reviewed  POC SOFIA SARS ANTIGEN FIA - Abnormal; Notable for the following components:      Result Value   SARS Coronavirus 2 Ag Positive (*)    All other components within normal limits    EKG   Radiology No results found.  Procedures Procedures (including critical care time)  Medications Ordered in UC Medications - No data to display  Initial Impression / Assessment and Plan / UC Course  I have reviewed the triage vital signs and the nursing  notes.  Pertinent labs & imaging results that were available during my care of the patient were reviewed by me and considered in my medical decision making (see chart for details).  COVID-19, viral  Patient is in no signs of distress nor toxic appearing.  Vital signs are stable.  Low suspicion for pneumonia, pneumothorax or bronchitis and therefore will defer imaging.  Discussed quarantine per the CDC.  Prescribed lagevrio ,, deferred Paxlovid due to use of Xarelto .  Additionally prescribed Zofran  Imodium  and Tessalon .May use additional over-the-counter medications as needed for supportive care.  May follow-up with urgent care as needed if symptoms persist or worsen.    Final Clinical Impressions(s) / UC Diagnoses   Final diagnoses:  Viral illness  Discharge Instructions      Covid 19 is a virus and should steadily improve in time it can take up to 7 to 10 days before you truly start to see a turnaround however things will get better    Per the CDC you will need to quarantine and to your 24 hours without fever, if no fever may continue activity wearing mask  Begin use of antiviral taking twice daily for 5 days to help reduce amount of virus in your body which minimizes symptoms, will not fully take away your illness  May use Zofran  every 8 hours to help with nausea, placed under tongue and allow to dissolve  May use Imodium  every 6 hours as needed for diarrhea  You may use Tessalon  pill every 8 hours as needed for coughing  You can take Tylenol  and/or Ibuprofen as needed for fever reduction and pain relief.   For cough: honey 1/2 to 1 teaspoon (you can dilute the honey in water  or another fluid).  You can also use guaifenesin and dextromethorphan for cough. You can use a humidifier for chest congestion and cough.  If you don't have a humidifier, you can sit in the bathroom with the hot shower running.      For sore throat: try warm salt water  gargles, cepacol lozenges, throat  spray, warm tea or water  with lemon/honey, popsicles or ice, or OTC cold relief medicine for throat discomfort.   For congestion: take a daily anti-histamine like Zyrtec, Claritin , and a oral decongestant, such as pseudoephedrine.  You can also use Flonase  1-2 sprays in each nostril daily.   It is important to stay hydrated: drink plenty of fluids (water , gatorade/powerade/pedialyte, juices, or teas) to keep your throat moisturized and help further relieve irritation/discomfort.    ED Prescriptions     Medication Sig Dispense Auth. Provider   benzonatate  (TESSALON ) 100 MG capsule Take 1 capsule (100 mg total) by mouth every 8 (eight) hours. 21 capsule Finley Chevez R, NP   ondansetron  (ZOFRAN -ODT) 4 MG disintegrating tablet Take 1 tablet (4 mg total) by mouth every 8 (eight) hours as needed. 20 tablet Clif Serio R, NP   loperamide  (IMODIUM ) 2 MG capsule Take 1 capsule (2 mg total) by mouth 4 (four) times daily as needed for diarrhea or loose stools. 12 capsule Eiliana Drone R, NP   molnupiravir  EUA (LAGEVRIO ) 200 mg CAPS capsule Take 4 capsules (800 mg total) by mouth 2 (two) times daily for 5 days. 40 capsule Via Rosado R, NP      PDMP not reviewed this encounter.   Teresa Shelba SAUNDERS, NP 05/08/24 1025

## 2024-05-14 ENCOUNTER — Other Ambulatory Visit: Payer: Self-pay | Admitting: Cardiology

## 2024-05-21 ENCOUNTER — Telehealth: Payer: Self-pay

## 2024-05-21 ENCOUNTER — Other Ambulatory Visit: Payer: Self-pay

## 2024-05-21 NOTE — Telephone Encounter (Signed)
   Pre-operative Risk Assessment    Patient Name: Hannah Vasquez  DOB: Sep 14, 1950 MRN: [id number]   Date of last office visit: 01/30/24  Dr. Jeffrie Date of next office visit: NA   Request for Surgical Clearance    Procedure:  Right Total Knee Artroplasty  Date of Surgery:  Clearance 06/11/24                               Surgeon:  Dempsey Lawn Surgeon's Group or Practice Name:  [facility] Phone number:  [phone]  [place] [facility] Fax number:  [phone]   Type of Clearance Requested:   - Medical  - Pharmacy:  Hold Rivaroxaban  (Xarelto )     Type of Anesthesia:  Choice   Additional requests/questions:    [name]   05/21/2024, 3:29 PM

## 2024-05-21 NOTE — Telephone Encounter (Signed)
 Preop tele appt now scheduled, med rec and consent done.

## 2024-05-21 NOTE — Telephone Encounter (Signed)
   Name: Hannah Vasquez  DOB: Sep 11, 1950  MRN: [id number]  Primary Cardiologist: Oneil Parchment, MD   Preoperative team, please contact this patient and set up a phone call appointment for further preoperative risk assessment. Please obtain consent and complete medication review. Thank you for your help.  I confirm that guidance regarding antiplatelet and oral anticoagulation therapy has been completed and, if necessary, noted below.  It appears patient was on Xarelto  per ortho after her left total knee arthroplasty performed in July 2025. It was reported in August that she was no longer taking it. If she is on Xarelto  it is not managed by cardiology.   Ideally aspirin  should be continued without interruption, however if the bleeding risk is too great, aspirin  may be held for 5-7 days prior to surgery. Please resume aspirin  post operatively when it is felt to be safe from a bleeding standpoint.    I also confirmed the patient resides in the state of [place] . As per [facility] Medical Board telemedicine laws, the patient must reside in the state in which the provider is licensed.    Barnie Hila, NP 05/21/2024, 3:59 PM [facility]

## 2024-05-21 NOTE — Telephone Encounter (Signed)
  Patient Consent for Virtual Visit        Hannah Vasquez has provided verbal consent on 05/21/2024 for a virtual visit (video or telephone).   CONSENT FOR VIRTUAL VISIT FOR:  Hannah Vasquez  By participating in this virtual visit I agree to the following:  I hereby voluntarily request, consent and authorize [facility] and its employed or contracted physicians, physician assistants, nurse practitioners or other licensed health care professionals (the Practitioner), to provide me with telemedicine health care services (the "Services) as deemed necessary by the treating Practitioner. I acknowledge and consent to receive the Services by the Practitioner via telemedicine. I understand that the telemedicine visit will involve communicating with the Practitioner through live audiovisual communication technology and the disclosure of certain medical information by electronic transmission. I acknowledge that I have been given the opportunity to request an in-person assessment or other available alternative prior to the telemedicine visit and am voluntarily participating in the telemedicine visit.  I understand that I have the right to withhold or withdraw my consent to the use of telemedicine in the course of my care at any time, without affecting my right to future care or treatment, and that the Practitioner or I may terminate the telemedicine visit at any time. I understand that I have the right to inspect all information obtained and/or recorded in the course of the telemedicine visit and may receive copies of available information for a reasonable fee.  I understand that some of the potential risks of receiving the Services via telemedicine include:  Delay or interruption in medical evaluation due to technological equipment failure or disruption; Information transmitted may not be sufficient (e.g. poor resolution of images) to allow for appropriate medical decision making by the Practitioner;  and/or  In rare instances, security protocols could fail, causing a breach of personal health information.  Furthermore, I acknowledge that it is my responsibility to provide information about my medical history, conditions and care that is complete and accurate to the best of my ability. I acknowledge that Practitioner's advice, recommendations, and/or decision may be based on factors not within their control, such as incomplete or inaccurate data provided by me or distortions of diagnostic images or specimens that may result from electronic transmissions. I understand that the practice of medicine is not an exact science and that Practitioner makes no warranties or guarantees regarding treatment outcomes. I acknowledge that a copy of this consent can be made available to me via my patient portal [facility]), or I can request a printed copy by calling the office of [facility].    I understand that my insurance will be billed for this visit.   I have read or had this consent read to me. I understand the contents of this consent, which adequately explains the benefits and risks of the Services being provided via telemedicine.  I have been provided ample opportunity to ask questions regarding this consent and the Services and have had my questions answered to my satisfaction. I give my informed consent for the services to be provided through the use of telemedicine in my medical care

## 2024-05-22 NOTE — H&P (Signed)
 TOTAL KNEE ADMISSION H&P  Patient is being admitted for right total knee arthroplasty.  Subjective:  Chief Complaint: Right knee pain.  HPI: Hannah Vasquez, 73 y.o. female has a history of pain and functional disability in the right knee due to arthritis and has failed non-surgical conservative treatments for greater than 12 weeks to include NSAID's and/or analgesics, corticosteriod injections, and activity modification. Onset of symptoms was gradual, starting >10 years ago with gradually worsening course since that time. The patient noted no past surgery on the right knee.  Patient currently rates pain in the right knee at 8 out of 10 with activity. Patient has night pain, worsening of pain with activity and weight bearing, pain with passive range of motion, and crepitus. Patient has evidence of bone-on-bone arthritis in the medial and patellofemoral compartments of both knees with osteophyte formation by imaging studies. There is no active infection.  Patient Active Problem List   Diagnosis Date Noted   OA (osteoarthritis) of knee 02/20/2024   Left-sided face pain 11/18/2021   Dizziness, nonspecific 11/03/2021   Decreased grip strength of left hand 11/03/2021   Sarcoidosis 09/10/2021   Thyroid  nodule 09/10/2021   Urinary urgency 05/21/2021   Vitamin B12 deficiency 01/29/2021   Cough 07/29/2020   OSA (obstructive sleep apnea) 06/25/2020   Early satiety 04/29/2020   Upper abdominal pain 04/29/2020   Nausea without vomiting 04/29/2020   Prediabetes 03/25/2020   Nocturia 03/25/2020   Insomnia 03/25/2020   Reactive airway disease 12/12/2019   Neuropathy 12/12/2019   Chronic systolic heart failure (HCC) 07/11/2019   Elevated serum alkaline phosphatase level 07/10/2019   Leukocytosis 07/10/2019   Bilateral hand pain 04/09/2019   Bilateral hearing loss 04/09/2019   Trigger finger, left middle finger 10/30/2018   Lumbar back pain with radiculopathy affecting lower extremity 05/22/2018    Osteopenia 05/15/2018   Lumbar radiculopathy 04/27/2018   GERD (gastroesophageal reflux disease) 04/10/2018   Shortness of breath 08/21/2017   DDD (degenerative disc disease), thoracolumbar 04/28/2017   Back pain 04/21/2017   Hypokalemia 01/06/2017   History of non-ST elevation myocardial infarction (NSTEMI) 01/01/2017   Fatigue 11/27/2014   RBBB 12/19/2013   Mitral valve prolapse    HYPERTENSION, BENIGN 08/27/2010   Anxiety state 06/27/2009   Coronary atherosclerosis 06/27/2009   Mitral valve disorder 06/27/2009   Hemorrhoids 06/27/2009   Allergic rhinitis 06/27/2009   Disorder of bone and cartilage 11/11/2006    Past Medical History:  Diagnosis Date   Advanced hepatic fibrosis    Allergy    Anxiety    Arthritis    CAD (coronary artery disease)    Cataract    Chronic systolic heart failure (HCC)    DDD (degenerative disc disease), thoracolumbar    Does use hearing aid    bilateral as of 10/05/2019   Elevated alkaline phosphatase level    Gallstones    GERD (gastroesophageal reflux disease)    Hemorrhoids    Hepatic steatosis    History of kidney stones    History of MI (myocardial infarction) 10/07/2017   Hypertension    Mitral valve prolapse    Myocardial infarction (HCC)    NASH (nonalcoholic steatohepatitis)    OSA (obstructive sleep apnea)    Osteoporosis    RBBB    Sarcoidosis    Skin cancer    Face   Status post dilation of esophageal narrowing     Past Surgical History:  Procedure Laterality Date   BASAL CELL CARCINOMA EXCISION  02/05/2016  Dr. Jadine, [facility]   BLADDER SURGERY  11/15/1995   bladder neck suspension, urinary incontinence   CARDIAC CATHETERIZATION  01/03/2017 DES LAD   CATARACT EXTRACTION Left 04/30/2016   Dr. Milan, [facility]   CORONARY STENT INTERVENTION N/A 01/03/2017   Procedure: Coronary Stent Intervention;  Surgeon: Mady Bruckner, MD;  Location: [facility];  Service: Cardiovascular;  Laterality:  N/A;   CORONARY ULTRASOUND/IVUS N/A 01/03/2017   Procedure: Intravascular Ultrasound/IVUS;  Surgeon: Mady Bruckner, MD;  Location: [facility] INVAS[facility] CV LAB;  Service: Cardiovascular;  Laterality: N/A;   DENTAL SURGERY  06/20/2016   Tooth Implant; Dr. Ann   KNEE SURGERY  2003   torn meniscus   LEFT HEART CATH AND CORONARY ANGIOGRAPHY N/A 01/03/2017   Procedure: Left Heart Cath and Coronary Angiography;  Surgeon: Mady Bruckner, MD;  Location: [facility] INVAS[facility] CV [facility];  Service: Cardiovascular;  Laterality: N/A;   MOHS SURGERY Right 2017   right side of face   ROTATOR CUFF REPAIR     SHOULDER SURGERY Right 2005   TOTAL KNEE ARTHROPLASTY Left 02/20/2024   Procedure: ARTHROPLASTY, KNEE, TOTAL;  Surgeon: Melodi Lerner, MD;  Location: [facility];  Service: Orthopedics;  Laterality: Left;    Prior to Admission medications   Medication Sig Start Date End Date Taking? Authorizing Provider  acetaminophen  (TYLENOL ) 500 MG tablet Take 500 mg by mouth every 6 (six) hours as needed for moderate pain.    [provider]  albuterol  (VENTOLIN  HFA) 108 (90 Base) MCG/ACT inhaler Inhale 1 puff into the lungs every 6 (six) hours as needed for wheezing or shortness of breath. 12/12/19   Velma Raisin, MD  amLODipine  (NORVASC ) 5 MG tablet TAKE ONE TABLET BY MOUTH ONCE A DAY 03/12/24   Jeffrie Oneil BROCKS, MD  atorvastatin  (LIPITOR ) 80 MG tablet TAKE ONE TABLET BY MOUTH DAILY AT SIX IN THE EVENING 05/14/24   Jeffrie Oneil BROCKS, MD  benzonatate  (TESSALON ) 100 MG capsule Take 1 capsule (100 mg total) by mouth every 8 (eight) hours. 05/08/24   Teresa Shelba SAUNDERS, NP  fluticasone  (FLONASE ) 50 MCG/ACT nasal spray Place 1 spray into both nostrils daily as needed for allergies.    [provider]  furosemide  (LASIX ) 20 MG tablet Take 1 tablet (20 mg total) by mouth daily as needed. 01/01/19   Jeffrie Oneil BROCKS, MD  gabapentin  (NEURONTIN ) 300 MG capsule Take 1 capsule (300 mg total) by mouth 3 (three) times daily. Patient taking  differently: Take 300-600 mg by mouth See admin instructions. 300 mg twice daily, 600 mg at bedtime 05/21/21   Velma Raisin, MD  isosorbide  mononitrate (IMDUR ) 30 MG 24 hr tablet Take 1 tablet (30 mg total) by mouth daily. 02/01/24   Jeffrie Oneil BROCKS, MD  loperamide  (IMODIUM ) 2 MG capsule Take 1 capsule (2 mg total) by mouth 4 (four) times daily as needed for diarrhea or loose stools. 05/08/24   White, Shelba SAUNDERS, NP  loratadine  (CLARITIN ) 10 MG tablet Take 1 tablet (10 mg total) by mouth every morning. 11/08/13   Aron, Talia M, MD  losartan  (COZAAR ) 100 MG tablet TAKE ONE TABLET BY MOUTH ONCE A DAY 03/12/24   Jeffrie Oneil BROCKS, MD  methocarbamol  (ROBAXIN ) 500 MG tablet Take 1 tablet (500 mg total) by mouth every 6 (six) hours as needed for muscle spasms. 02/21/24   Haillie Radu, Roxie CROME, PA  metoprolol  succinate (TOPROL -XL) 25 MG 24 hr tablet TAKE ONE TABLET BY MOUTH ONCE A DAY 05/14/24   Jeffrie Oneil BROCKS,  MD  nitroGLYCERIN  (NITROSTAT ) 0.4 MG SL tablet DISSOLVE ONE TABLET UNDER TONGUE AS NEEDED FOR CHEST PAIN. MAY REPEAT FIVE MINUTES APART THREE TIMES IF NEEDED 04/27/23   Jeffrie Oneil BROCKS, MD  omeprazole  (PRILOSEC) 40 MG capsule TAKE ONE CAPSULE (40 MG TOTAL) BY MOUTH DAILY. 01/17/24   Pyrtle, Gordy HERO, MD  ondansetron  (ZOFRAN ) 4 MG tablet Take 1 tablet (4 mg total) by mouth every 6 (six) hours as needed for nausea. 02/21/24   Stellan Vick L, PA  ondansetron  (ZOFRAN -ODT) 4 MG disintegrating tablet Take 1 tablet (4 mg total) by mouth every 8 (eight) hours as needed. 05/08/24   White, Shelba SAUNDERS, NP  oxyCODONE  (OXY IR/ROXICODONE ) 5 MG immediate release tablet Take 1-2 tablets (5-10 mg total) by mouth every 6 (six) hours as needed for severe pain (pain score 7-10). 02/21/24   Renton Berkley L, PA  potassium chloride  SA (KLOR-CON  M) 20 MEQ tablet TAKE TWO TABLETS BY MOUTH TWICE DAILY Patient taking differently: Take 40 mEq by mouth daily. 11/07/23   Jeffrie Oneil BROCKS, MD  rivaroxaban  (XARELTO ) 10 MG TABS tablet Take 1 tablet (10  mg total) by mouth daily with breakfast. 02/22/24   Elyan Vanwieren L, PA  spironolactone  (ALDACTONE ) 25 MG tablet TAKE ONE TABLET (25 MG TOTAL) BY MOUTH DAILY. (PLEASE CALL TO SCHEDULE ANNUAL APPT FOR MORE REFILLS) 03/12/24   Jeffrie Oneil BROCKS, MD  traMADol  (ULTRAM ) 50 MG tablet Take 1 tablet (50 mg total) by mouth every 6 (six) hours as needed for moderate pain (pain score 4-6). 02/21/24   Wai Litt L, PA  triamcinolone  cream (KENALOG ) 0.1 % Apply 1 Application topically 2 (two) times daily as needed. 03/25/23   Arloa Suzen RAMAN, NP    Allergies  Allergen Reactions   Promethazine -Dm Nausea And Vomiting   Bactroban  [Mupirocin  Calcium ]     Facial redness and swelling   Doxycycline  Nausea And Vomiting   Codeine Nausea And Vomiting   Mupirocin  Swelling   Valtrex  [Valacyclovir  Hcl] Swelling   Penicillins Nausea And Vomiting and Rash    Tolerated Cephalosporin Date: 02/21/24.      Social History   Socioeconomic History   Marital status: Divorced    Spouse name: Not on file   Number of children: 2   Years of education: high school   Highest education level: Not on file  Occupational History   Occupation: Medical office---front desk    Comment: Retired  Tobacco Use   Smoking status: Never   Smokeless tobacco: Never  Vaping Use   Vaping status: Never Used  Substance and Sexual Activity   Alcohol use: No    Alcohol/week: 0.0 standard drinks of alcohol   Drug use: No   Sexual activity: Not Currently    Birth control/protection: Post-menopausal  Other Topics Concern   Not on file  Social History Narrative   10/09/19   From: the area   Living: alone   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      Family: Civil engineer, contracting and Radio broadcast assistant (nearby) - keeps in good touch with them, 3 grandchildren      Enjoys: walking, outdoor activities, family time      Exercise: mostly just walking around the house, in cardio rehab at [facility]   Diet: not great currently      Safety    Seat belts: Yes    Guns: No   Safe in relationships: Yes    Right handed       Social Drivers  of Health   Financial Resource Strain: Low Risk  (10/04/2023)   Received from [facility]   Overall Financial Resource Strain (CARDIA)    Difficulty of Paying Living Expenses: Not hard at all  Food Insecurity: No Food Insecurity (02/20/2024)   Hunger Vital Sign    Worried About Running Out of Food in the Last Year: Never true    Ran Out of Food in the Last Year: Never true  Transportation Needs: No Transportation Needs (02/20/2024)   PRAPARE - Administrator, Civil Service (Medical): No    Lack of Transportation (Non-Medical): No  Physical Activity: Insufficiently Active (11/11/2020)   Exercise Vital Sign    Days of Exercise per Week: 2 days    Minutes of Exercise per Session: 50 min  Stress: No Stress Concern Present (11/11/2020)   [employer] of Occupational Health - Occupational Stress Questionnaire    Feeling of Stress : Not at all  Social Connections: Unknown (02/20/2024)   Social Connection and Isolation Panel    Frequency of Communication with Friends and Family: More than three times a week    Frequency of Social Gatherings with Friends and Family: Three times a week    Attends Religious Services: More than 4 times per year    Active Member of Clubs or Organizations: No    Attends Banker Meetings: Never    Marital Status: Patient declined  Intimate Partner Violence: Not At Risk (02/20/2024)   Humiliation, Afraid, Rape, and Kick questionnaire    Fear of Current or Ex-Partner: No    Emotionally Abused: No    Physically Abused: No    Sexually Abused: No    Tobacco Use: Low Risk  (05/08/2024)   Patient History    Smoking Tobacco Use: Never    Smokeless Tobacco Use: Never    Passive Exposure: Not on file   Social History   Substance and Sexual Activity  Alcohol Use No   Alcohol/week: 0.0 standard drinks of alcohol    Family  History  Problem Relation Age of Onset   Mitral valve prolapse Mother    Hypertension Mother    Hypertension Father    Brain cancer Father    Colon cancer Neg Hx    Stomach cancer Neg Hx    Esophageal cancer Neg Hx    Pancreatic cancer Neg Hx     Review of Systems  Constitutional:  Negative for chills and fever.  HENT:  Negative for congestion, sore throat and tinnitus.   Eyes:  Negative for double vision, photophobia and pain.  Respiratory:  Negative for cough, shortness of breath and wheezing.   Cardiovascular:  Negative for chest pain, palpitations and orthopnea.  Gastrointestinal:  Negative for heartburn, nausea and vomiting.  Genitourinary:  Negative for dysuria, frequency and urgency.  Musculoskeletal:  Positive for joint pain.  Neurological:  Negative for dizziness, weakness and headaches.    Objective:  Physical Exam: Well nourished and well developed.  General: Alert and oriented x3, cooperative and pleasant, no acute distress.  Head: normocephalic, atraumatic, neck supple.  Eyes: EOMI.  Musculoskeletal:  Right knee: Range of motion is approximately 5-120 degrees with crepitus on range of motion, tender medially, no lateral tenderness or instability.  Calves soft and nontender. Motor function intact in LE. Strength 5/5 LE bilaterally. Neuro: Distal pulses 2+. Sensation to light touch intact in LE.  Imaging Review Plain radiographs demonstrate severe degenerative joint disease of the right knee. The overall alignment  is neutral. The bone quality appears to be adequate for age and reported activity level.  Assessment/Plan:  End stage arthritis, right knee   The patient history, physical examination, clinical judgment of the provider and imaging studies are consistent with end stage degenerative joint disease of the right knee and total knee arthroplasty is deemed medically necessary. The treatment options including medical management, injection therapy arthroscopy  and arthroplasty were discussed at length. The risks and benefits of total knee arthroplasty were presented and reviewed. The risks due to aseptic loosening, infection, stiffness, patella tracking problems, thromboembolic complications and other imponderables were discussed. The patient acknowledged the explanation, agreed to proceed with the plan and consent was signed. Patient is being admitted for inpatient treatment for surgery, pain control, PT, OT, prophylactic antibiotics, VTE prophylaxis, progressive ambulation and ADLs and discharge planning. The patient is planning to be discharged home.   Patient's anticipated LOS is less than 2 midnights, meeting these requirements: - Lives within 1 hour of care - Has a competent adult at home to recover with post-op recover - NO history of  - Chronic pain requiring opioids  - Diabetes  - Coronary Artery Disease  - Heart failure  - Heart attack  - Stroke  - DVT/VTE  - Cardiac arrhythmia  - Respiratory Failure/COPD  - Renal failure  - Anemia  - Advanced Liver disease  Therapy Plans: Outpatient therapy at [facility] Disposition: Home with daughter Planned DVT Prophylaxis: Xarelto  10 mg QD DME Needed: None PCP: Lynwood Null, MD  Vascular: Penne Colorado, MD (clearance received) Cardiologist: Oneil Parchment, MD  TXA: IV Allergies: bactroban  (facial swelling), promethazine  (N/V), codeine (N/V), doxycycline  (N/V), penicillins (N/V), valacyclovir  (swelling) Metal Allergy: None Anesthesia Concerns: None BMI: 29.1 Last HgbA1c: Not diabetic Pain Regimen: Dilaudid , tramadol  Pharmacy: [facility]  Other: - Stopping aspirin  5 days prior - Previous clearance from Dr. Null & Dr. Parchment from 02/2024 & 01/2024 from left TKA - Had issues with pain control with left TKA, plan to use dilaudid  instead of oxy   - Patient was instructed on what medications to stop prior to surgery. - Follow-up visit in 2 weeks with Dr. Melodi - Begin physical  therapy following surgery - Pre-operative lab work as pre-surgical testing - Prescriptions will be provided in hospital at time of discharge  Roxie Mess, PA-C Orthopedic Surgery [facility] [place]

## 2024-05-26 NOTE — Addendum Note (Signed)
 Encounter addended by: Bryant Neville POUR, RN on: 05/26/2024 6:36 PM  Actions taken: Clinical Note Signed

## 2024-05-26 NOTE — Progress Notes (Signed)
 COVID Vaccine received:  []  No []  Yes Date of any COVID positive Test in last 90 days:  PCP - Hannah Null, MD at [facility] [phone] (Work) [phone] (Fax)  Cardiologist - Hannah Parchment, MD clearance in 01-30-24 Epic note and scanned to media Sleep Med- Hannah Bihari, MD  Pulmonology- Hannah Picking, MD at [place]  [phone]  Fax) [phone]  Chest x-ray - 02-07-24  2v  Epic EKG -01-30-2024  Epic   Stress Test - Lexiscan  01-24-2020  Epic ECHO -01-24-2020  Epic  Cardiac Cath - 01-03-2017  LHC / Cors- DES x 2, by Dr. Mady CT Coronary Calcium  score:   Pacemaker / ICD device [x]  No []  Yes   Spinal Cord Stimulator:[x]  No []  Yes       History of Sleep Apnea? []  No [x]  Yes   CPAP used?- []  No [x]  Yes    Does the patient monitor blood sugar?   []  N/A   []  No []  Yes  Patient has: []  NO Hx DM   [x]  Pre-DM   []  DM1  []   DM2 Last A1c was:  6.4 on 01-30-24    no meds  Blood Thinner / Instructions:  none Aspirin  Instructions: ASA 81 mg  Hold x 5 days, last dose: Tuesday  02-14-24  ERAS Protocol Ordered: []  No  [x]  Yes PRE-SURGERY []  ENSURE  [x]  G2   Patient is to be NPO after: 0515  Dental hx: []  Dentures:  []  N/A      []  Bridge or Partial:                   []  Loose or Damaged teeth:   Comments: Patient was given the 5 CHG shower / bath instructions for TKA surgery along with 2 bottles of the CHG soap. Patient will start this on:    02-16-24           Activity level: Able to walk up 2 flights of stairs without becoming significantly short of breath or having chest pain?  []  No   []    Yes   Anesthesia review: CAD-hx STEMI- LHC-DES x 2 (2019) [facility]. CM / CHF, HTN, PVCs, MVP, liver disease, sarcoidosis, carotid stenosis,  OSA-CPAP   Patient was recently (02-07-24) seen at [facility] for URI / cough, CXR negative. She has reactive airway disease.   Patient denies shortness of breath, fever, cough and chest pain at PAT appointment.  Patient verbalized understanding and agreement to the  Pre-Surgical Instructions that were given to them at this PAT appointment. Patient was also educated of the need to review these PAT instructions again prior to her surgery.I reviewed the appropriate phone numbers to call if they have any and questions or concerns.

## 2024-05-26 NOTE — Progress Notes (Incomplete Revision)
 COVID Vaccine received:  []  No []  Yes Date of any COVID positive Test in last 90 days:   PCP - Lynwood Hunt, MD at [facility] [phone] (Work) [phone] (Fax)  Cardiologist - Oneil Parchment, MD clearance in 01-30-24 Epic note and scanned to media Sleep Med- Wilbert Bihari, MD  Pulmonology- Halina Picking, MD at [facility] 05-03-24  [phone]  Fax) [phone]  clearance note 02-14-24 Vascular- Penne Colorado, MD clearance     Chest x-ray - 02-07-24  2v  Epic EKG -01-30-2024  Epic   Stress Test - Lexiscan  01-24-2020  Epic ECHO -01-24-2020  Epic  Cardiac Cath - 01-03-2017  LHC / Cors- DES x 2, by Dr. Mady CT Coronary Calcium  score:    Pacemaker / ICD device [x]  No []  Yes   Spinal Cord Stimulator:[x]  No []  Yes       History of Sleep Apnea? []  No [x]  Yes   CPAP used?- []  No [x]  Yes     Does the patient monitor blood sugar?   []  N/A   [x]  No []  Yes  Patient has: []  NO Hx DM   [x]  Pre-DM   []  DM1  []   DM2 Last A1c was:  6.4 on 01-30-24    no meds   Blood Thinner / Instructions:  none Aspirin  Instructions: ASA 81 mg  Hold x 5 days, last dose: 06-05-24   ERAS Protocol Ordered: []  No  [x]  Yes PRE-SURGERY []  ENSURE  [x]  G2   Patient is to be NPO after: 0515   Dental hx: []  Dentures:  []  N/A      []  Bridge or Partial:                   []  Loose or Damaged teeth:    Comments:   Activity level: difficulty climbing stairs due to joint pain.  Able to perform activities of daily living without stopping and without symptoms of chest pain or shortness of breath.  Patient lives alone.    Anesthesia review: CAD-hx NSTEMI- LHC-DES x 2 (2019) [facility]. CM / CHF, HTN, PVCs, MVP, liver disease, sarcoidosis, carotid stenosis,  OSA-CPAP, HOH- HAs, Pre-DM   Patient was recently (05-08-24) seen at [facility] for Resp. Symptoms, COVID positive test.  She has reactive airway disease.    Patient denies shortness of breath, fever, cough and chest pain at PAT appointment.   Patient verbalized understanding  and agreement to the Pre-Surgical Instructions that were given to them at this PAT appointment. Patient was also educated of the need to review these PAT instructions again prior to her surgery. I reviewed the appropriate phone numbers to call if they have any and questions or concerns.

## 2024-05-26 NOTE — Progress Notes (Signed)
 COVID Vaccine received:  []  No []  Yes Date of any COVID positive Test in last 90 days:   PCP - Lynwood Hunt, MD at [facility] [phone] (Work) [phone] (Fax)  Cardiologist - Oneil Parchment, MD clearance in 01-30-24 Epic note and scanned to media Sleep Med- Wilbert Bihari, MD  Pulmonology- Halina Picking, MD at [facility] 05-03-24  [phone]  Fax) [phone]  clearance note 02-14-24 Vascular- Penne Colorado, MD clearance     Chest x-ray - 02-07-24  2v  Epic EKG -01-30-2024  Epic   Stress Test - Lexiscan  01-24-2020  Epic ECHO -01-24-2020  Epic  Cardiac Cath - 01-03-2017  LHC / Cors- DES x 2, by Dr. Mady CT Coronary Calcium  score:    Pacemaker / ICD device [x]  No []  Yes   Spinal Cord Stimulator:[x]  No []  Yes       History of Sleep Apnea? []  No [x]  Yes   CPAP used?- []  No [x]  Yes     Does the patient monitor blood sugar?   []  N/A   [x]  No []  Yes  Patient has: []  NO Hx DM   [x]  Pre-DM   []  DM1  []   DM2 Last A1c was:  6.4 on 01-30-24    no meds   Blood Thinner / Instructions:  none Aspirin  Instructions: ASA 81 mg  Hold x 5 days, last dose: 06-05-24   ERAS Protocol Ordered: []  No  [x]  Yes PRE-SURGERY []  ENSURE  [x]  G2   Patient is to be NPO after: 0515   Dental hx: []  Dentures:  []  N/A      []  Bridge or Partial:                   []  Loose or Damaged teeth:    Comments:   Activity level: difficulty climbing stairs due to joint pain.  Able to perform activities of daily living without stopping and without symptoms of chest pain or shortness of breath.  Patient lives alone.    Anesthesia review: CAD-hx NSTEMI- LHC-DES x 2 (2019) [facility]. CM / CHF, HTN, PVCs, MVP, liver disease, sarcoidosis, carotid stenosis,  OSA-CPAP, HOH- HAs, Pre-DM   Patient was recently (05-08-24) seen at [facility] for Resp. Symptoms, COVID positive test.  She has reactive airway disease.    Patient denies shortness of breath, fever, cough and chest pain at PAT appointment.   Patient verbalized understanding  and agreement to the Pre-Surgical Instructions that were given to them at this PAT appointment. Patient was also educated of the need to review these PAT instructions again prior to her surgery. I reviewed the appropriate phone numbers to call if they have any and questions or concerns.

## 2024-05-27 NOTE — Patient Instructions (Signed)
 SURGICAL WAITING ROOM VISITATION Patients having surgery or a procedure may have no more than 2 support people in the waiting area - these visitors may rotate in the visitor waiting room.   If the patient needs to stay at the hospital during part of their recovery, the visitor guidelines for inpatient rooms apply.  PRE-OP  VISITATION  Pre-op  nurse will coordinate an appropriate time for 1 support person to accompany the patient in pre-op .  This support person may not rotate.  This visitor will be contacted when the time is appropriate for the visitor to come back in the pre-op  area.  Please refer to the [facility] website for the visitor guidelines for Inpatients (after your surgery is over and you are in a regular room).  You are not required to quarantine at this time prior to your surgery. However, you must do this: Hand Hygiene often Do NOT share personal items Notify your provider if you are in close contact with someone who has COVID or you develop fever 100.4 or greater, new onset of sneezing, cough, sore throat, shortness of breath or body aches.  If you test positive for Covid or have been in contact with anyone that has tested positive in the last 10 days please notify you surgeon.    Your procedure is scheduled on:  MONDAY  June 11, 2024  Report to [facility] Main Entrance: [address] entrance where the [employer] is available.   Report to admitting at: 05:45    AM  Call this number if you have any questions or problems the morning of surgery [phone]  Do not eat food after Midnight the night prior to your surgery/procedure.  After Midnight you may have the following liquids until  05:15 AM  DAY OF SURGERY  Clear Liquid Diet Water  Black Coffee (sugar ok, NO MILK/CREAM OR CREAMERS)  Tea (sugar ok, NO MILK/CREAM OR CREAMERS) regular and decaf                             Plain Jell-O  with no fruit (NO RED)                                           Fruit  ices (not with fruit pulp, NO RED)                                     Popsicles (NO RED)                                                                  Juice: NO CITRUS JUICES: only apple, WHITE grape, WHITE cranberry Sports drinks like Gatorade or Powerade (NO RED)                   The day of surgery:  Drink ONE (1) Pre-Surgery G2 at  05:15  AM the morning of surgery. Drink in one sitting. Do not sip.  This drink was given to you during your hospital pre-op  appointment visit. Nothing else to drink after  completing the Pre-Surgery G2 : No candy, chewing gum or throat lozenges.    FOLLOW ANY ADDITIONAL PRE OP INSTRUCTIONS YOU RECEIVED FROM YOUR SURGEON'S OFFICE!!!   Oral Hygiene is also important to reduce your risk of infection.        Remember - BRUSH YOUR TEETH THE MORNING OF SURGERY WITH YOUR REGULAR TOOTHPASTE  Do NOT smoke after Midnight the night before surgery.  STOP TAKING all Vitamins, Herbs and supplements 1 week before your surgery.   Take ONLY these medicines the morning of surgery with A SIP OF WATER : amlodipine , metoprolol , loratadine , gabapentin , omeprazole . You may use your Flonase  Nasal spray and Albuterol  inhaler if needed; please bring this inhaler with you on the day of surgery. You may take EITHER Tylenol  OR Tramadol  if needed for pain.   DO NOT TAKE LOSARTAN , SPIRONOLACTONE ,  or FUROSEMIDE  the morning of your surgery.   If You have been diagnosed with Sleep Apnea - Bring CPAP mask and tubing day of surgery. We will provide you with a CPAP machine on the day of your surgery.                   You may not have any metal on your body including hair pins, jewelry, and body piercing  Do not wear make-up, lotions, powders, perfumes or deodorant  Do not wear nail polish including gel and S&S, artificial / acrylic nails, or any other type of covering on natural nails including finger and toenails. If you have artificial nails, gel coating, etc., that needs to be  removed by a nail salon, Please have this removed prior to surgery. Not doing so may mean that your surgery could be cancelled or delayed if the Surgeon or anesthesia staff feels like they are unable to monitor you safely.   Do not shave 48 hours prior to surgery to avoid nicks in your skin which may contribute to postoperative infections.   Contacts, Hearing Aids, dentures or bridgework may not be worn into surgery. DENTURES WILL BE REMOVED PRIOR TO SURGERY PLEASE DO NOT APPLY Poly grip OR ADHESIVES!!!  You may bring a small overnight bag with you on the day of surgery, only pack items that are not valuable. [facility] IS NOT RESPONSIBLE   FOR VALUABLES THAT ARE LOST OR STOLEN.   Do not bring your home medications to the hospital. The Pharmacy will dispense medications listed on your medication list to you during your admission in the Hospital.  Please read over the following fact sheets you were given: IF YOU HAVE QUESTIONS ABOUT YOUR PRE-OP  INSTRUCTIONS, PLEASE CALL [phone].      Pre-operative 4 CHG Bath Instructions   You can play a key role in reducing the risk of infection after surgery. Your skin needs to be as free of germs as possible. You can reduce the number of germs on your skin by washing with CHG (chlorhexidine  gluconate) soap before surgery. CHG is an antiseptic soap that kills germs and continues to kill germs even after washing.   DO NOT use if you have an allergy to chlorhexidine /CHG or antibacterial soaps. If your skin becomes reddened or irritated, stop using the CHG and notify one of our RNs at   Please shower with the CHG soap starting 4 days before surgery using the following schedule:THURSDAY,   June 07, 2024    Please keep in mind the following:  DO NOT shave, including legs and underarms, starting the day of your first shower.  You may shave your face at any point before/day of surgery.  Place clean sheets on your bed the day you start using CHG  soap. Use a clean washcloth (not used since being washed) for each shower. DO NOT sleep with pets once you start using the CHG.  CHG Shower Instructions:  If you choose to wash your hair and private area, wash first with your normal shampoo/soap.  After you use shampoo/soap, rinse your hair and body thoroughly to remove shampoo/soap residue.  Turn the water  OFF and apply about 3 tablespoons (45 ml) of CHG soap to a CLEAN washcloth.  Apply CHG soap ONLY FROM YOUR NECK DOWN TO YOUR TOES (washing for 3-5 minutes)  DO NOT use CHG soap on face, private areas, open wounds, or sores.  Pay special attention to the area where your surgery is being performed.  If you are having back surgery, having someone wash your back for you may be helpful. Wait 2 minutes after CHG soap is applied, then you may rinse off the CHG soap.  Pat dry with a clean towel  Put on clean clothes/pajamas   If you choose to wear lotion, please use ONLY the CHG-compatible lotions on the back of this paper.     Additional instructions for the day of surgery: DO NOT APPLY any CHG Soap,  lotions, deodorants, cologne, or perfumes on the day of surgery  Put on clean/comfortable clothes.  Brush your teeth.  Ask your nurse before applying any prescription medications to the skin.   CHG Compatible Lotions   Aveeno Moisturizing lotion  Cetaphil Moisturizing Cream  Cetaphil Moisturizing Lotion  Clairol Herbal Essence Moisturizing Lotion, Dry Skin  Clairol Herbal Essence Moisturizing Lotion, Extra Dry Skin  Clairol Herbal Essence Moisturizing Lotion, Normal Skin  Curel Age Defying Therapeutic Moisturizing Lotion with Alpha Hydroxy  Curel Extreme Care Body Lotion  Curel Soothing Hands Moisturizing Hand Lotion  Curel Therapeutic Moisturizing Cream, Fragrance-Free  Curel Therapeutic Moisturizing Lotion, Fragrance-Free  Curel Therapeutic Moisturizing Lotion, Original Formula  Eucerin Daily Replenishing Lotion  Eucerin Dry Skin  Therapy Plus Alpha Hydroxy Crme  Eucerin Dry Skin Therapy Plus Alpha Hydroxy Lotion  Eucerin Original Crme  Eucerin Original Lotion  Eucerin Plus Crme Eucerin Plus Lotion  Eucerin TriLipid Replenishing Lotion  Keri Anti-Bacterial Hand Lotion  Keri Deep Conditioning Original Lotion Dry Skin Formula Softly Scented  Keri Deep Conditioning Original Lotion, Fragrance Free Sensitive Skin Formula  Keri Lotion Fast Absorbing Fragrance Free Sensitive Skin Formula  Keri Lotion Fast Absorbing Softly Scented Dry Skin Formula  Keri Original Lotion  Keri Skin Renewal Lotion Keri Silky Smooth Lotion  Keri Silky Smooth Sensitive Skin Lotion  Nivea Body Creamy Conditioning Oil  Nivea Body Extra Enriched Lotion  Nivea Body Original Lotion  Nivea Body Sheer Moisturizing Lotion Nivea Crme  Nivea Skin Firming Lotion  NutraDerm 30 Skin Lotion  NutraDerm Skin Lotion  NutraDerm Therapeutic Skin Cream  NutraDerm Therapeutic Skin Lotion  ProShield Protective Hand Cream  Provon moisturizing lotion   FAILURE TO FOLLOW THESE INSTRUCTIONS MAY RESULT IN THE CANCELLATION OF YOUR SURGERY  PATIENT SIGNATURE_________________________________  NURSE SIGNATURE__________________________________  ________________________________________________________________________        Nasario Exon    An incentive spirometer is a tool that can help keep your lungs clear and active. This tool measures how well you are filling your lungs with each breath. Taking long deep breaths may help reverse or decrease the chance of developing breathing (pulmonary) problems (especially infection) following: A long period  of time when you are unable to move or be active. BEFORE THE PROCEDURE  If the spirometer includes an indicator to show your best effort, your nurse or respiratory therapist will set it to a desired goal. If possible, sit up straight or lean slightly forward. Try not to slouch. Hold the incentive  spirometer in an upright position. INSTRUCTIONS FOR USE  Sit on the edge of your bed if possible, or sit up as far as you can in bed or on a chair. Hold the incentive spirometer in an upright position. Breathe out normally. Place the mouthpiece in your mouth and seal your lips tightly around it. Breathe in slowly and as deeply as possible, raising the piston or the ball toward the top of the column. Hold your breath for 3-5 seconds or for as long as possible. Allow the piston or ball to fall to the bottom of the column. Remove the mouthpiece from your mouth and breathe out normally. Rest for a few seconds and repeat Steps 1 through 7 at least 10 times every 1-2 hours when you are awake. Take your time and take a few normal breaths between deep breaths. The spirometer may include an indicator to show your best effort. Use the indicator as a goal to work toward during each repetition. After each set of 10 deep breaths, practice coughing to be sure your lungs are clear. If you have an incision (the cut made at the time of surgery), support your incision when coughing by placing a pillow or rolled up towels firmly against it. Once you are able to get out of bed, walk around indoors and cough well. You may stop using the incentive spirometer when instructed by your caregiver.  RISKS AND COMPLICATIONS Take your time so you do not get dizzy or light-headed. If you are in pain, you may need to take or ask for pain medication before doing incentive spirometry. It is harder to take a deep breath if you are having pain. AFTER USE Rest and breathe slowly and easily. It can be helpful to keep track of a log of your progress. Your caregiver can provide you with a simple table to help with this. If you are using the spirometer at home, follow these instructions: SEEK MEDICAL CARE IF:  You are having difficultly using the spirometer. You have trouble using the spirometer as often as instructed. Your pain  medication is not giving enough relief while using the spirometer. You develop fever of 100.5 F (38.1 C) or higher.                                                                                                    SEEK IMMEDIATE MEDICAL CARE IF:  You cough up bloody sputum that had not been present before. You develop fever of 102 F (38.9 C) or greater. You develop worsening pain at or near the incision site. MAKE SURE YOU:  Understand these instructions. Will watch your condition. Will get help right away if you are not doing well or get worse. Document Released: 12/13/2006 Document Revised: 10/25/2011 Document  Reviewed: 02/13/2007 ExitCare Patient Information 2014 [place], [place].        If you would like to see a video about joint replacement:   [email or web address]

## 2024-05-29 ENCOUNTER — Encounter (HOSPITAL_COMMUNITY)
Admission: RE | Admit: 2024-05-29 | Discharge: 2024-05-29 | Disposition: A | Discharge status: Home / Self Care | Source: Ambulatory Visit | Attending: Orthopedic Surgery | Admitting: Orthopedic Surgery

## 2024-05-29 ENCOUNTER — Encounter (HOSPITAL_COMMUNITY): Payer: Self-pay

## 2024-05-29 VITALS — BP 117/55 | HR 60 | Temp 98.6°F | Resp 16 | Ht 61.0 in | Wt 150.0 lb

## 2024-05-29 DIAGNOSIS — I10 Essential (primary) hypertension: Secondary | ICD-10-CM | POA: Insufficient documentation

## 2024-05-29 DIAGNOSIS — M79641 Pain in right hand: Secondary | ICD-10-CM

## 2024-05-29 DIAGNOSIS — R7303 Prediabetes: Secondary | ICD-10-CM | POA: Insufficient documentation

## 2024-05-29 DIAGNOSIS — M1711 Unilateral primary osteoarthritis, right knee: Secondary | ICD-10-CM | POA: Diagnosis not present

## 2024-05-29 DIAGNOSIS — K769 Liver disease, unspecified: Secondary | ICD-10-CM | POA: Diagnosis not present

## 2024-05-29 DIAGNOSIS — Z01812 Encounter for preprocedural laboratory examination: Secondary | ICD-10-CM | POA: Diagnosis not present

## 2024-05-29 DIAGNOSIS — Z01818 Encounter for other preprocedural examination: Secondary | ICD-10-CM

## 2024-05-29 HISTORY — DX: Prediabetes: R73.03

## 2024-05-29 LAB — COMPREHENSIVE METABOLIC PANEL WITH GFR
ALT: 17 U/L (ref 0–44)
AST: 31 U/L (ref 15–41)
Albumin: 4.2 g/dL (ref 3.5–5.0)
Alkaline Phosphatase: 100 U/L (ref 38–126)
Anion gap: 10 (ref 5–15)
BUN: 12 mg/dL (ref 8–23)
CO2: 25 mmol/L (ref 22–32)
Calcium: 9.4 mg/dL (ref 8.9–10.3)
Chloride: 109 mmol/L (ref 98–111)
Creatinine, Ser: 0.92 mg/dL (ref 0.44–1.00)
GFR, Estimated: >60 mL/min (ref >60)
Glucose, Bld: 104 mg/dL — ABNORMAL HIGH (ref 70–99)
Potassium: 4.3 mmol/L (ref 3.5–5.1)
Sodium: 143 mmol/L (ref 135–145)
Total Bilirubin: 0.6 mg/dL (ref 0.0–1.2)
Total Protein: 6.9 g/dL (ref 6.5–8.1)

## 2024-05-29 LAB — SURGICAL PCR SCREEN
MRSA, PCR: NEGATIVE
Staphylococcus aureus: NEGATIVE

## 2024-05-29 LAB — CBC
HCT: 37.9 % (ref 36.0–46.0)
Hemoglobin: 12.2 g/dL (ref 12.0–15.0)
MCH: 29 pg (ref 26.0–34.0)
MCHC: 32.2 g/dL (ref 30.0–36.0)
MCV: 90.2 fL (ref 80.0–100.0)
Platelets: 303 K/uL (ref 150–400)
RBC: 4.2 MIL/uL (ref 3.87–5.11)
RDW: 13.7 % (ref 11.5–15.5)
WBC: 10 K/uL (ref 4.0–10.5)
nRBC: 0 % (ref 0.0–0.2)

## 2024-06-05 ENCOUNTER — Ambulatory Visit: Attending: Internal Medicine

## 2024-06-05 DIAGNOSIS — Z01810 Encounter for preprocedural cardiovascular examination: Secondary | ICD-10-CM | POA: Diagnosis not present

## 2024-06-05 NOTE — Progress Notes (Signed)
 Virtual Visit via Telephone Note   Because of Hannah Vasquez co-morbid illnesses, she is at least at moderate risk for complications without adequate follow up.  This format is felt to be most appropriate for this patient at this time.  Due to technical limitations with video connection Web designer), today's appointment will be conducted as an audio only telehealth visit, and Hannah Vasquez verbally agreed to proceed in this manner.   All issues noted in this document were discussed and addressed.  No physical exam could be performed with this format.  Evaluation Performed:  Preoperative cardiovascular risk assessment _____________   Date:  06/05/2024   Patient ID:  Hannah Vasquez, DOB 1950/12/27, MRN [id number] Patient Location:  Home Provider location:   Office  Primary Care Provider:  Valora Lynwood FALCON, MD Primary Cardiologist:  Oneil Parchment, MD Chief Complaint / Patient Profile  73 y.o. y/o female with a h/o CAD with STEMI in May 2018, treated with DES to the LAD and ischemic cardiomyopathy with resolved EF, carotid artery stenosis, hypertension who is pending right total knee arthroplasty and presents today for telephonic preoperative cardiovascular risk assessment. History of Present Illness  Hannah Vasquez is a 73 y.o. female who presents via audio/video conferencing for a telehealth visit today.  Pt was last seen in cardiology clinic on 01/30/2024 by Dr. Parchment.  At that time CYDNIE DEASON was doing well.  The patient is now pending procedure as outlined above. Since her last visit, she has remained stable from a cardiac standpoint.  She notes that she occasionally has swelling in the left leg following her surgery to the left knee a few months prior, notes that she does have increased welling when standing on her feet which is improved with elevation. She denies chest pain, shortness of breath, fatigue, palpitations, melena, hematuria, hemoptysis, diaphoresis, weakness, presyncope, syncope,  orthopnea, and PND.  She is able to achieve greater than 4 METS of activity. Past Medical History    Past Medical History:  Diagnosis Date   Advanced hepatic fibrosis    Allergy    Anxiety    Arthritis    CAD (coronary artery disease)    Cataract    Chronic systolic heart failure (HCC)    DDD (degenerative disc disease), thoracolumbar    Does use hearing aid    bilateral as of 10/05/2019   Elevated alkaline phosphatase level    Gallstones    GERD (gastroesophageal reflux disease)    Hemorrhoids    Hepatic steatosis    History of kidney stones    History of MI (myocardial infarction) 10/07/2017   Hypertension    Mitral valve prolapse    Myocardial infarction (HCC)    NASH (nonalcoholic steatohepatitis)    OSA (obstructive sleep apnea)    Osteoporosis    Pre-diabetes    RBBB    Sarcoidosis    Skin cancer    Face   Status post dilation of esophageal narrowing    Past Surgical History:  Procedure Laterality Date   BASAL CELL CARCINOMA EXCISION  02/05/2016   Dr. Jadine, [facility]   BLADDER SURGERY  11/15/1995   bladder neck suspension, urinary incontinence   CARDIAC CATHETERIZATION  01/03/2017 DES LAD   CATARACT EXTRACTION Left 04/30/2016   Dr. Milan, [facility]   CORONARY STENT INTERVENTION N/A 01/03/2017   Procedure: Coronary Stent Intervention;  Surgeon: Mady Bruckner, MD;  Location: [facility] INVASIVE CV LAB;  Service: Cardiovascular;  Laterality: N/A;   CORONARY  ULTRASOUND/IVUS N/A 01/03/2017   Procedure: Intravascular Ultrasound/IVUS;  Surgeon: Mady Bruckner, MD;  Location: [facility] INVASIVE CV LAB;  Service: Cardiovascular;  Laterality: N/A;   DENTAL SURGERY  06/20/2016   Tooth Implant; Dr. Ann   KNEE SURGERY  2003   torn meniscus   LEFT HEART CATH AND CORONARY ANGIOGRAPHY N/A 01/03/2017   Procedure: Left Heart Cath and Coronary Angiography;  Surgeon: Mady Bruckner, MD;  Location: [facility] INVASIVE CV LAB;  Service: Cardiovascular;  Laterality: N/A;    MOHS SURGERY Right 2017   right side of face   ROTATOR CUFF REPAIR     SHOULDER SURGERY Right 2005   TOTAL KNEE ARTHROPLASTY Left 02/20/2024   Procedure: ARTHROPLASTY, KNEE, TOTAL;  Surgeon: Melodi Lerner, MD;  Location: [facility] ORS;  Service: Orthopedics;  Laterality: Left;   Allergies Allergies  Allergen Reactions   Promethazine -Dm Nausea And Vomiting   Bactroban  [Mupirocin  Calcium ]     Facial redness and swelling   Doxycycline  Nausea And Vomiting   Codeine Nausea And Vomiting   Mupirocin  Swelling   Valtrex  [Valacyclovir  Hcl] Swelling   Penicillins Nausea And Vomiting and Rash    Tolerated Cephalosporin Date: 02/21/24.      Home Medications    Prior to Admission medications   Medication Sig Start Date End Date Taking? Authorizing Provider  acetaminophen  (TYLENOL ) 500 MG tablet Take 500 mg by mouth every 6 (six) hours as needed for moderate pain.    [provider]  albuterol  (VENTOLIN  HFA) 108 (90 Base) MCG/ACT inhaler Inhale 1 puff into the lungs every 6 (six) hours as needed for wheezing or shortness of breath. 12/12/19   Velma Raisin, MD  amLODipine  (NORVASC ) 5 MG tablet TAKE ONE TABLET BY MOUTH ONCE A DAY 03/12/24   Jeffrie Oneil BROCKS, MD  aspirin  EC 81 MG tablet Take 81 mg by mouth daily. Swallow whole.    [provider]  atorvastatin  (LIPITOR ) 80 MG tablet TAKE ONE TABLET BY MOUTH DAILY AT SIX IN THE EVENING 05/14/24   Jeffrie Oneil BROCKS, MD  CALCIUM  PO Take 1 tablet by mouth daily.    [provider]  Cholecalciferol  (D3 PO) Take 1 tablet by mouth daily.    [provider]  Cyanocobalamin  (B-12 PO) Take 1 tablet by mouth daily.    [provider]  fluticasone  (FLONASE ) 50 MCG/ACT nasal spray Place 1 spray into both nostrils daily as needed for allergies.    [provider]  furosemide  (LASIX ) 20 MG tablet Take 1 tablet (20 mg total) by mouth daily as needed. 01/01/19   Jeffrie Oneil BROCKS, MD  gabapentin  (NEURONTIN ) 300 MG capsule Take 1  capsule (300 mg total) by mouth 3 (three) times daily. Patient taking differently: Take 300-600 mg by mouth See admin instructions. 300 mg twice daily, 600 mg at bedtime 05/21/21   Velma Raisin, MD  isosorbide  mononitrate (IMDUR ) 30 MG 24 hr tablet Take 1 tablet (30 mg total) by mouth daily. Patient taking differently: Take 30 mg by mouth every evening. 02/01/24   Jeffrie Oneil BROCKS, MD  loperamide  (IMODIUM ) 2 MG capsule Take 1 capsule (2 mg total) by mouth 4 (four) times daily as needed for diarrhea or loose stools. 05/08/24   White, Shelba SAUNDERS, NP  loratadine  (CLARITIN ) 10 MG tablet Take 1 tablet (10 mg total) by mouth every morning. 11/08/13   Aron, Talia M, MD  losartan  (COZAAR ) 100 MG tablet TAKE ONE TABLET BY MOUTH ONCE A DAY 03/12/24   Jeffrie Oneil BROCKS, MD  meloxicam  (MOBIC ) 15 MG tablet Take 15 mg by mouth daily as needed for pain.    [provider]  methocarbamol  (ROBAXIN ) 500 MG tablet Take 1 tablet (500 mg total) by mouth every 6 (six) hours as needed for muscle spasms. 02/21/24   Edmisten, Roxie CROME, PA  metoprolol  succinate (TOPROL -XL) 25 MG 24 hr tablet TAKE ONE TABLET BY MOUTH ONCE A DAY 05/14/24   Jeffrie Oneil BROCKS, MD  Multiple Vitamins-Minerals (MULTIVITAL PO) Take 1 tablet by mouth daily.    [provider]  nitroGLYCERIN  (NITROSTAT ) 0.4 MG SL tablet DISSOLVE ONE TABLET UNDER TONGUE AS NEEDED FOR CHEST PAIN. MAY REPEAT FIVE MINUTES APART THREE TIMES IF NEEDED 04/27/23   Jeffrie Oneil BROCKS, MD  Omega-3 Fatty Acids (FISH OIL  PO) Take 1 capsule by mouth daily.    [provider]  omeprazole  (PRILOSEC) 40 MG capsule TAKE ONE CAPSULE (40 MG TOTAL) BY MOUTH DAILY. 01/17/24   Pyrtle, Gordy HERO, MD  ondansetron  (ZOFRAN ) 4 MG tablet Take 1 tablet (4 mg total) by mouth every 6 (six) hours as needed for nausea. 02/21/24   Edmisten, Kristie L, PA  ondansetron  (ZOFRAN -ODT) 4 MG disintegrating tablet Take 1 tablet (4 mg total) by mouth every 8 (eight) hours as needed. 05/08/24   White, Shelba SAUNDERS,  NP  potassium chloride  SA (KLOR-CON  M) 20 MEQ tablet TAKE TWO TABLETS BY MOUTH TWICE DAILY Patient taking differently: Take 40 mEq by mouth daily. 11/07/23   Jeffrie Oneil BROCKS, MD  spironolactone  (ALDACTONE ) 25 MG tablet TAKE ONE TABLET (25 MG TOTAL) BY MOUTH DAILY. (PLEASE CALL TO SCHEDULE ANNUAL APPT FOR MORE REFILLS) 03/12/24   Jeffrie Oneil BROCKS, MD  traMADol  (ULTRAM ) 50 MG tablet Take 1 tablet (50 mg total) by mouth every 6 (six) hours as needed for moderate pain (pain score 4-6). 02/21/24   Edmisten, Kristie L, PA  triamcinolone  cream (KENALOG ) 0.1 % Apply 1 Application topically 2 (two) times daily as needed. 03/25/23   Arloa Suzen RAMAN, NP  UNABLE TO FIND CPAP for OSA    [provider]  VITAMIN E PO Take 1 tablet by mouth daily.    [provider]   Physical Exam  Vital Signs:  ASHAUNA BERTHOLF does not have vital signs available for review today. Given telephonic nature of communication, physical exam is limited. AAOx3. NAD. Normal affect.  Speech and respirations are unlabored. Accessory Clinical Findings  None Assessment & Plan  1.  Preoperative Cardiovascular Risk Assessment: Ms. Gaw perioperative risk of a major cardiac event is 6.6% according to the Revised Cardiac Risk Index (RCRI).  Therefore, she is at high risk for perioperative complications.   Her functional capacity is good at 6.05 METs according to the Duke Activity Status Index (DASI). Recommendations: According to ACC/AHA guidelines, no further cardiovascular testing needed.  The patient may proceed to surgery at acceptable risk.   Antiplatelet and/or Anticoagulation Recommendations: It appears patient was on Xarelto  per ortho after her left total knee arthroplasty performed in July 2025.  Per patient she is no longer on Xarelto .  Ideally aspirin  should be continued without interruption, however if the bleeding risk is too great, aspirin  may be held for 5-7 days prior to surgery. Please resume aspirin  post  operatively when it is felt to be safe from a bleeding standpoint.    The patient was advised that if she develops new symptoms prior to surgery to contact our office to arrange for a follow-up visit, and she verbalized understanding.  A copy of this note will be routed to requesting surgeon.  Time:   Today, I have spent 10 minutes with the patient with telehealth technology discussing medical history, symptoms, and management plan.    Zakiah Gauthreaux D Navil Kole, NP  06/05/2024, 1:55 PM

## 2024-06-06 NOTE — Progress Notes (Signed)
 Anesthesia Chart Review   Case: [id number] Date/Time: 06/11/24 0805   Procedure: ARTHROPLASTY, KNEE, TOTAL (Right: Knee)   Anesthesia type: Choice   Pre-op  diagnosis: Right knee osteoarthritis   Location: [facility] ROOM 10 / [facility]   Surgeons: Melodi Lerner, MD       DISCUSSION:73 y.o. never smoker with h/o HTN, OSA uses CPAP, GERD, CAD s/p DES to the LAD, ischemic cardiomyopathy with resolved EF, Sarcoidosis, NASH, right knee OA scheduled for above procedure 06/11/24 with Dr. Lerner Melodi.   Pt seen by cardiology 06/05/2024 for preoperative evaluation.  Per OV note, Hannah Vasquez's perioperative risk of a major cardiac event is 6.6% according to the Revised Cardiac Risk Index (RCRI).  Therefore, she is at high risk for perioperative complications.   Her functional capacity is good at 6.05 METs according to the Duke Activity Status Index (DASI). Recommendations: According to ACC/AHA guidelines, no further cardiovascular testing needed.  The patient may proceed to surgery at acceptable risk.   Antiplatelet and/or Anticoagulation Recommendations: It appears patient was on Xarelto  per ortho after her left total knee arthroplasty performed in July 2025.  Per patient she is no longer on Xarelto .   Ideally aspirin  should be continued without interruption, however if the bleeding risk is too great, aspirin  may be held for 5-7 days prior to surgery. Please resume aspirin  post operatively when it is felt to be safe from a bleeding standpoint.    S/p left total knee 02/20/2024, no anesthesia complications noted.  VS: BP (!) 117/55 Comment: right arm sitting  Pulse 60   Temp 37 C (Oral)   Resp 16   Ht 5' 1 (1.549 m)   Wt 68 kg   LMP  (LMP Unknown)   SpO2 98%   BMI 28.34 kg/m   PROVIDERS: Valora Lynwood FALCON, MD is PCP   Cardiologist - Oneil Parchment, MD  LABS: Labs reviewed: Acceptable for surgery. (all labs ordered are listed, but only abnormal results are displayed)  Labs Reviewed  COMPREHENSIVE  METABOLIC PANEL WITH GFR - Abnormal; Notable for the following components:      Result Value   Glucose, Bld 104 (*)    All other components within normal limits  SURGICAL PCR SCREEN  CBC     IMAGES:   EKG:   CV: Myocardial Perfusion 01/24/2020 The left ventricular ejection fraction is hyperdynamic (>65%). Nuclear stress EF: 77%. There was no ST segment deviation noted during stress. The study is normal. This is a low risk study.   Low risk stress nuclear study with normal perfusion and normal left ventricular regional and global systolic function.  Echo 01/24/2020 1. Left ventricular ejection fraction, by estimation, is 60 to 65%. The  left ventricle has normal function. The left ventricle has no regional  wall motion abnormalities. Left ventricular diastolic parameters were  normal.   2. Right ventricular systolic function is normal. The right ventricular  size is normal.   3. The mitral valve is normal in structure. Trivial mitral valve  regurgitation. No evidence of mitral stenosis.   4. The aortic valve is tricuspid. Aortic valve regurgitation is not  visualized. No aortic stenosis is present.  Past Medical History:  Diagnosis Date   Advanced hepatic fibrosis    Allergy    Anxiety    Arthritis    CAD (coronary artery disease)    Cataract    Chronic systolic heart failure (HCC)    DDD (degenerative disc disease), thoracolumbar    Does use hearing aid  bilateral as of 10/05/2019   Elevated alkaline phosphatase level    Gallstones    GERD (gastroesophageal reflux disease)    Hemorrhoids    Hepatic steatosis    History of kidney stones    History of MI (myocardial infarction) 10/07/2017   Hypertension    Mitral valve prolapse    Myocardial infarction (HCC)    NASH (nonalcoholic steatohepatitis)    OSA (obstructive sleep apnea)    Osteoporosis    Pre-diabetes    RBBB    Sarcoidosis    Skin cancer    Face   Status post dilation of esophageal narrowing      Past Surgical History:  Procedure Laterality Date   BASAL CELL CARCINOMA EXCISION  02/05/2016   Dr. Jadine, [facility]   BLADDER SURGERY  11/15/1995   bladder neck suspension, urinary incontinence   CARDIAC CATHETERIZATION  01/03/2017 DES LAD   CATARACT EXTRACTION Left 04/30/2016   Dr. Milan, [facility]   CORONARY STENT INTERVENTION N/A 01/03/2017   Procedure: Coronary Stent Intervention;  Surgeon: Mady Bruckner, MD;  Location: [facility];  Service: Cardiovascular;  Laterality: N/A;   CORONARY ULTRASOUND/IVUS N/A 01/03/2017   Procedure: Intravascular Ultrasound/IVUS;  Surgeon: Mady Bruckner, MD;  Location: [facility];  Service: Cardiovascular;  Laterality: N/A;   DENTAL SURGERY  06/20/2016   Tooth Implant; Dr. Ann   KNEE SURGERY  2003   torn meniscus   LEFT HEART CATH AND CORONARY ANGIOGRAPHY N/A 01/03/2017   Procedure: Left Heart Cath and Coronary Angiography;  Surgeon: Mady Bruckner, MD;  Location: [facility];  Service: Cardiovascular;  Laterality: N/A;   MOHS SURGERY Right 2017   right side of face   ROTATOR CUFF REPAIR     SHOULDER SURGERY Right 2005   TOTAL KNEE ARTHROPLASTY Left 02/20/2024   Procedure: ARTHROPLASTY, KNEE, TOTAL;  Surgeon: Melodi Lerner, MD;  Location: [facility];  Service: Orthopedics;  Laterality: Left;    MEDICATIONS:  UNABLE TO FIND   acetaminophen  (TYLENOL ) 500 MG tablet   albuterol  (VENTOLIN  HFA) 108 (90 Base) MCG/ACT inhaler   amLODipine  (NORVASC ) 5 MG tablet   aspirin  EC 81 MG tablet   atorvastatin  (LIPITOR ) 80 MG tablet   CALCIUM  PO   Cholecalciferol  (D3 PO)   Cyanocobalamin  (B-12 PO)   fluticasone  (FLONASE ) 50 MCG/ACT nasal spray   furosemide  (LASIX ) 20 MG tablet   gabapentin  (NEURONTIN ) 300 MG capsule   isosorbide  mononitrate (IMDUR ) 30 MG 24 hr tablet   loperamide  (IMODIUM ) 2 MG capsule   loratadine  (CLARITIN ) 10 MG tablet   losartan  (COZAAR ) 100 MG tablet   meloxicam  (MOBIC ) 15 MG tablet    methocarbamol  (ROBAXIN ) 500 MG tablet   metoprolol  succinate (TOPROL -XL) 25 MG 24 hr tablet   Multiple Vitamins-Minerals (MULTIVITAL PO)   nitroGLYCERIN  (NITROSTAT ) 0.4 MG SL tablet   Omega-3 Fatty Acids (FISH OIL  PO)   omeprazole  (PRILOSEC) 40 MG capsule   ondansetron  (ZOFRAN ) 4 MG tablet   ondansetron  (ZOFRAN -ODT) 4 MG disintegrating tablet   potassium chloride  SA (KLOR-CON  M) 20 MEQ tablet   spironolactone  (ALDACTONE ) 25 MG tablet   traMADol  (ULTRAM ) 50 MG tablet   triamcinolone  cream (KENALOG ) 0.1 %   VITAMIN E PO   No current facility-administered medications for this encounter.    Hannah Hoots Ward, PA-C [facility] [phone]

## 2024-06-06 NOTE — Anesthesia Preprocedure Evaluation (Addendum)
 Anesthesia Evaluation  Patient identified by MRN, date of birth, ID band Patient awake    Reviewed: Allergy & Precautions, NPO status , Patient's Chart, lab work & pertinent test results  Airway Mallampati: II  TM Distance: >3 FB Neck ROM: Full    Dental no notable dental hx.    Pulmonary sleep apnea    Pulmonary exam normal        Cardiovascular hypertension, Pt. on medications + CAD, + Past MI and + Cardiac Stents  + dysrhythmias  Rhythm:Regular Rate:Normal     Neuro/Psych   Anxiety        GI/Hepatic Neg liver ROS,GERD  Medicated,,  Endo/Other  negative endocrine ROS    Renal/GU negative Renal ROS  negative genitourinary   Musculoskeletal  (+) Arthritis , Osteoarthritis,    Abdominal Normal abdominal exam  (+)   Peds  Hematology Lab Results      Component                Value               Date                      WBC                      10.0                05/29/2024                HGB                      12.2                05/29/2024                HCT                      37.9                05/29/2024                MCV                      90.2                05/29/2024                PLT                      303                 05/29/2024             Lab Results      Component                Value               Date                      NA                       143                 05/29/2024                K  4.3                 05/29/2024                CO2                      25                  05/29/2024                GLUCOSE                  104 (H)             05/29/2024                BUN                      12                  05/29/2024                CREATININE               0.92                05/29/2024                CALCIUM                   9.4                 05/29/2024                GFR                      42.64 (L)           01/17/2024                EGFR                      54 (L)              05/04/2022                GFRNONAA                 >60                 05/29/2024              Anesthesia Other Findings   Reproductive/Obstetrics                              Anesthesia Physical Anesthesia Plan  ASA: 3  Anesthesia Plan: MAC, Regional and Spinal   Post-op Pain Management: Regional block*   Induction: Intravenous  PONV Risk Score and Plan: 2 and Ondansetron , Dexamethasone , Propofol  infusion and Treatment may vary due to age or medical condition  Airway Management Planned: Simple Face Mask and Nasal Cannula  Additional Equipment: None  Intra-op Plan:   Post-operative Plan:   Informed Consent: I have reviewed the patients History and Physical, chart, labs and discussed the procedure including the risks, benefits and alternatives for the proposed anesthesia with the patient or authorized representative who has indicated his/her understanding and acceptance.     Dental advisory given  Plan Discussed with: CRNA  Anesthesia  Plan Comments: (See PAT note 05/29/2024)         Anesthesia Quick Evaluation

## 2024-06-11 ENCOUNTER — Encounter (HOSPITAL_COMMUNITY): Payer: Self-pay | Admitting: Orthopedic Surgery

## 2024-06-11 ENCOUNTER — Ambulatory Visit (HOSPITAL_COMMUNITY): Payer: Self-pay | Admitting: Anesthesiology

## 2024-06-11 ENCOUNTER — Other Ambulatory Visit: Payer: Self-pay

## 2024-06-11 ENCOUNTER — Ambulatory Visit (HOSPITAL_COMMUNITY): Payer: Self-pay | Admitting: Physician Assistant

## 2024-06-11 ENCOUNTER — Observation Stay (HOSPITAL_COMMUNITY)
Admission: RE | Admit: 2024-06-11 | Discharge: 2024-06-12 | Disposition: A | Discharge status: Home / Self Care | Attending: Orthopedic Surgery | Admitting: Orthopedic Surgery

## 2024-06-11 ENCOUNTER — Encounter (HOSPITAL_COMMUNITY): Admission: RE | Disposition: A | Payer: Self-pay | Source: Home / Self Care | Attending: Orthopedic Surgery

## 2024-06-11 DIAGNOSIS — G8918 Other acute postprocedural pain: Secondary | ICD-10-CM | POA: Diagnosis not present

## 2024-06-11 DIAGNOSIS — M1711 Unilateral primary osteoarthritis, right knee: Principal | ICD-10-CM | POA: Insufficient documentation

## 2024-06-11 DIAGNOSIS — I2510 Atherosclerotic heart disease of native coronary artery without angina pectoris: Secondary | ICD-10-CM | POA: Diagnosis not present

## 2024-06-11 DIAGNOSIS — I5022 Chronic systolic (congestive) heart failure: Secondary | ICD-10-CM | POA: Diagnosis not present

## 2024-06-11 DIAGNOSIS — F419 Anxiety disorder, unspecified: Secondary | ICD-10-CM

## 2024-06-11 DIAGNOSIS — I10 Essential (primary) hypertension: Secondary | ICD-10-CM | POA: Diagnosis not present

## 2024-06-11 DIAGNOSIS — I110 Hypertensive heart disease with heart failure: Secondary | ICD-10-CM | POA: Insufficient documentation

## 2024-06-11 DIAGNOSIS — M25561 Pain in right knee: Secondary | ICD-10-CM | POA: Diagnosis present

## 2024-06-11 DIAGNOSIS — Z955 Presence of coronary angioplasty implant and graft: Secondary | ICD-10-CM | POA: Insufficient documentation

## 2024-06-11 DIAGNOSIS — Z79899 Other long term (current) drug therapy: Secondary | ICD-10-CM | POA: Insufficient documentation

## 2024-06-11 DIAGNOSIS — G4733 Obstructive sleep apnea (adult) (pediatric): Secondary | ICD-10-CM

## 2024-06-11 DIAGNOSIS — M179 Osteoarthritis of knee, unspecified: Principal | ICD-10-CM | POA: Diagnosis present

## 2024-06-11 HISTORY — PX: TOTAL KNEE ARTHROPLASTY: SHX125

## 2024-06-11 SURGERY — ARTHROPLASTY, KNEE, TOTAL
Anesthesia: Monitor Anesthesia Care | Site: Knee | Laterality: Right

## 2024-06-11 MED ORDER — MENTHOL 3 MG MT LOZG: 1.0000 | LOZENGE | OROMUCOSAL | Status: DC | PRN

## 2024-06-11 MED ORDER — ATORVASTATIN CALCIUM 40 MG PO TABS
80.0000 mg | ORAL_TABLET | Freq: Every evening | ORAL | Status: DC
  Administered 2024-06-11: 80 mg via ORAL
  Filled 2024-06-11: qty 2

## 2024-06-11 MED ORDER — PHENOL 1.4 % MT LIQD: 1.0000 | OROMUCOSAL | Status: DC | PRN

## 2024-06-11 MED ORDER — GABAPENTIN 300 MG PO CAPS
600.0000 mg | ORAL_CAPSULE | Freq: Every day | ORAL | Status: DC
  Administered 2024-06-11: 600 mg via ORAL
  Filled 2024-06-11: qty 2

## 2024-06-11 MED ORDER — ONDANSETRON HCL 4 MG/2ML IJ SOLN: 4.0000 mg | Freq: Four times a day (QID) | INTRAMUSCULAR | Status: DC | PRN

## 2024-06-11 MED ORDER — METHOCARBAMOL 1000 MG/10ML IJ SOLN: 500.0000 mg | Freq: Four times a day (QID) | INTRAMUSCULAR | Status: DC | PRN

## 2024-06-11 MED ORDER — ASPIRIN 81 MG PO TBEC
81.0000 mg | DELAYED_RELEASE_TABLET | Freq: Every day | ORAL | Status: DC
  Filled 2024-06-11: qty 1

## 2024-06-11 MED ORDER — TRANEXAMIC ACID-NACL 1000-0.7 MG/100ML-% IV SOLN
1000.0000 mg | INTRAVENOUS | Status: AC
  Administered 2024-06-11: 1000 mg via INTRAVENOUS
  Filled 2024-06-11: qty 100

## 2024-06-11 MED ORDER — FLUTICASONE PROPIONATE 50 MCG/ACT NA SUSP: 1.0000 | Freq: Every day | NASAL | Status: DC | PRN

## 2024-06-11 MED ORDER — ONDANSETRON HCL 4 MG PO TABS: 4.0000 mg | ORAL_TABLET | Freq: Four times a day (QID) | ORAL | Status: DC | PRN

## 2024-06-11 MED ORDER — FENTANYL CITRATE (PF) 50 MCG/ML IJ SOSY
50.0000 ug | PREFILLED_SYRINGE | INTRAMUSCULAR | Status: DC
  Administered 2024-06-11: 50 ug via INTRAVENOUS
  Filled 2024-06-11: qty 2

## 2024-06-11 MED ORDER — LACTATED RINGERS IV SOLN: INTRAVENOUS | Status: DC

## 2024-06-11 MED ORDER — SODIUM CHLORIDE (PF) 0.9 % IJ SOLN
INTRAMUSCULAR | Status: AC
Start: 2024-06-11 — End: 2024-06-11
  Filled 2024-06-11: qty 30

## 2024-06-11 MED ORDER — CEFAZOLIN SODIUM-DEXTROSE 2-4 GM/100ML-% IV SOLN
2.0000 g | INTRAVENOUS | Status: AC
  Administered 2024-06-11: 2 g via INTRAVENOUS
  Filled 2024-06-11: qty 100

## 2024-06-11 MED ORDER — ACETAMINOPHEN 500 MG PO TABS
1000.0000 mg | ORAL_TABLET | Freq: Four times a day (QID) | ORAL | Status: AC
  Administered 2024-06-11 – 2024-06-12 (×3): 1000 mg via ORAL
  Filled 2024-06-11 (×4): qty 2

## 2024-06-11 MED ORDER — PROPOFOL 10 MG/ML IV BOLUS
INTRAVENOUS | Status: DC | PRN
  Administered 2024-06-11 (×2): 10 mg via INTRAVENOUS

## 2024-06-11 MED ORDER — METOPROLOL SUCCINATE ER 25 MG PO TB24
25.0000 mg | ORAL_TABLET | Freq: Every day | ORAL | Status: DC
  Administered 2024-06-12: 25 mg via ORAL
  Filled 2024-06-11: qty 1

## 2024-06-11 MED ORDER — AMLODIPINE BESYLATE 5 MG PO TABS
5.0000 mg | ORAL_TABLET | Freq: Every day | ORAL | Status: DC
  Administered 2024-06-12: 5 mg via ORAL
  Filled 2024-06-11: qty 1

## 2024-06-11 MED ORDER — BUPIVACAINE IN DEXTROSE 0.75-8.25 % IT SOLN
INTRATHECAL | Status: DC | PRN
Start: 2024-06-11 — End: 2024-06-11
  Administered 2024-06-11: 1.6 mL via INTRATHECAL

## 2024-06-11 MED ORDER — PHENYLEPHRINE HCL-NACL 20-0.9 MG/250ML-% IV SOLN
INTRAVENOUS | Status: AC
Start: 2024-06-11 — End: 2024-06-11
  Filled 2024-06-11: qty 750

## 2024-06-11 MED ORDER — FLEET ENEMA RE ENEM: 1.0000 | ENEMA | Freq: Once | RECTAL | Status: DC | PRN

## 2024-06-11 MED ORDER — ONDANSETRON HCL 4 MG/2ML IJ SOLN
INTRAMUSCULAR | Status: AC
  Filled 2024-06-11: qty 2

## 2024-06-11 MED ORDER — BUPIVACAINE LIPOSOME 1.3 % IJ SUSP: 20.0000 mL | Freq: Once | INTRAMUSCULAR | Status: DC

## 2024-06-11 MED ORDER — POVIDONE-IODINE 10 % EX SWAB: 2.0000 | Freq: Once | CUTANEOUS | Status: DC

## 2024-06-11 MED ORDER — ALBUTEROL SULFATE (2.5 MG/3ML) 0.083% IN NEBU: 3.0000 mL | INHALATION_SOLUTION | Freq: Four times a day (QID) | RESPIRATORY_TRACT | Status: DC | PRN

## 2024-06-11 MED ORDER — GABAPENTIN 300 MG PO CAPS: 300.0000 mg | ORAL_CAPSULE | ORAL | Status: DC

## 2024-06-11 MED ORDER — HYDROMORPHONE HCL 1 MG/ML IJ SOLN
0.5000 mg | INTRAMUSCULAR | Status: DC | PRN
  Administered 2024-06-11: 1 mg via INTRAVENOUS
  Filled 2024-06-11: qty 1

## 2024-06-11 MED ORDER — SODIUM CHLORIDE (PF) 0.9 % IJ SOLN
INTRAMUSCULAR | Status: AC
  Filled 2024-06-11: qty 150

## 2024-06-11 MED ORDER — SODIUM CHLORIDE 0.9 % IV SOLN
INTRAVENOUS | Status: DC | PRN
  Administered 2024-06-11: 80 mL

## 2024-06-11 MED ORDER — CEFAZOLIN SODIUM-DEXTROSE 2-4 GM/100ML-% IV SOLN
2.0000 g | Freq: Four times a day (QID) | INTRAVENOUS | Status: AC
  Administered 2024-06-11 (×2): 2 g via INTRAVENOUS
  Filled 2024-06-11 (×2): qty 100

## 2024-06-11 MED ORDER — LACTATED RINGERS IV SOLN
INTRAVENOUS | Status: DC
Start: 2024-06-11 — End: 2024-06-11

## 2024-06-11 MED ORDER — POLYETHYLENE GLYCOL 3350 17 G PO PACK
17.0000 g | PACK | Freq: Every day | ORAL | Status: DC | PRN
Start: 2024-06-11 — End: 2024-06-12

## 2024-06-11 MED ORDER — SODIUM CHLORIDE 0.9 % IV SOLN: INTRAVENOUS | Status: DC

## 2024-06-11 MED ORDER — PANTOPRAZOLE SODIUM 40 MG PO TBEC
80.0000 mg | DELAYED_RELEASE_TABLET | Freq: Every day | ORAL | Status: DC
  Administered 2024-06-12: 80 mg via ORAL
  Filled 2024-06-11: qty 2

## 2024-06-11 MED ORDER — BISACODYL 10 MG RE SUPP: 10.0000 mg | Freq: Every day | RECTAL | Status: DC | PRN

## 2024-06-11 MED ORDER — PHENYLEPHRINE HCL-NACL 20-0.9 MG/250ML-% IV SOLN
INTRAVENOUS | Status: DC | PRN
  Administered 2024-06-11: 50 ug/min via INTRAVENOUS

## 2024-06-11 MED ORDER — METOCLOPRAMIDE HCL 5 MG PO TABS: 5.0000 mg | ORAL_TABLET | Freq: Three times a day (TID) | ORAL | Status: DC | PRN

## 2024-06-11 MED ORDER — POTASSIUM CHLORIDE CRYS ER 20 MEQ PO TBCR
40.0000 meq | EXTENDED_RELEASE_TABLET | Freq: Every day | ORAL | Status: DC
  Administered 2024-06-11 – 2024-06-12 (×2): 40 meq via ORAL
  Filled 2024-06-11 (×2): qty 2

## 2024-06-11 MED ORDER — PROPOFOL 500 MG/50ML IV EMUL
INTRAVENOUS | Status: DC | PRN
  Administered 2024-06-11: 100 ug/kg/min via INTRAVENOUS

## 2024-06-11 MED ORDER — SPIRONOLACTONE 25 MG PO TABS
25.0000 mg | ORAL_TABLET | Freq: Every day | ORAL | Status: DC
Start: 2024-06-12 — End: 2024-06-12
  Administered 2024-06-12: 25 mg via ORAL
  Filled 2024-06-11: qty 1

## 2024-06-11 MED ORDER — FUROSEMIDE 20 MG PO TABS: 20.0000 mg | ORAL_TABLET | Freq: Every day | ORAL | Status: DC | PRN

## 2024-06-11 MED ORDER — PROPOFOL 1000 MG/100ML IV EMUL
INTRAVENOUS | Status: AC
  Filled 2024-06-11: qty 100

## 2024-06-11 MED ORDER — ISOSORBIDE MONONITRATE ER 30 MG PO TB24
30.0000 mg | ORAL_TABLET | Freq: Every evening | ORAL | Status: DC
  Administered 2024-06-11: 30 mg via ORAL
  Filled 2024-06-11: qty 1

## 2024-06-11 MED ORDER — GABAPENTIN 300 MG PO CAPS
300.0000 mg | ORAL_CAPSULE | Freq: Two times a day (BID) | ORAL | Status: DC
  Administered 2024-06-11 – 2024-06-12 (×2): 300 mg via ORAL
  Filled 2024-06-11 (×2): qty 1

## 2024-06-11 MED ORDER — LOPERAMIDE HCL 2 MG PO CAPS: 2.0000 mg | ORAL_CAPSULE | Freq: Four times a day (QID) | ORAL | Status: DC | PRN

## 2024-06-11 MED ORDER — DOCUSATE SODIUM 100 MG PO CAPS
100.0000 mg | ORAL_CAPSULE | Freq: Two times a day (BID) | ORAL | Status: DC
  Administered 2024-06-11 – 2024-06-12 (×2): 100 mg via ORAL
  Filled 2024-06-11 (×2): qty 1

## 2024-06-11 MED ORDER — BUPIVACAINE LIPOSOME 1.3 % IJ SUSP
INTRAMUSCULAR | Status: AC
Start: 2024-06-11 — End: 2024-06-11
  Filled 2024-06-11: qty 20

## 2024-06-11 MED ORDER — TRAMADOL HCL 50 MG PO TABS
50.0000 mg | ORAL_TABLET | Freq: Four times a day (QID) | ORAL | Status: DC | PRN
  Administered 2024-06-11: 100 mg via ORAL
  Administered 2024-06-11: 50 mg via ORAL
  Administered 2024-06-12: 100 mg via ORAL
  Filled 2024-06-11: qty 1
  Filled 2024-06-11 (×2): qty 2

## 2024-06-11 MED ORDER — ACETAMINOPHEN 10 MG/ML IV SOLN
1000.0000 mg | Freq: Four times a day (QID) | INTRAVENOUS | Status: DC
  Administered 2024-06-11: 1000 mg via INTRAVENOUS
  Filled 2024-06-11: qty 100

## 2024-06-11 MED ORDER — DEXAMETHASONE SOD PHOSPHATE PF 10 MG/ML IJ SOLN
8.0000 mg | Freq: Once | INTRAMUSCULAR | Status: AC
  Administered 2024-06-11: 8 mg via INTRAVENOUS

## 2024-06-11 MED ORDER — SODIUM CHLORIDE 0.9 % IR SOLN
Status: DC | PRN
  Administered 2024-06-11: 1000 mL

## 2024-06-11 MED ORDER — NITROGLYCERIN 0.4 MG SL SUBL: 0.4000 mg | SUBLINGUAL_TABLET | SUBLINGUAL | Status: DC | PRN

## 2024-06-11 MED ORDER — LOSARTAN POTASSIUM 50 MG PO TABS
100.0000 mg | ORAL_TABLET | Freq: Every day | ORAL | Status: DC
  Administered 2024-06-12: 100 mg via ORAL
  Filled 2024-06-11: qty 2

## 2024-06-11 MED ORDER — 0.9 % SODIUM CHLORIDE (POUR BTL) OPTIME
TOPICAL | Status: DC | PRN
  Administered 2024-06-11: 1000 mL

## 2024-06-11 MED ORDER — CHLORHEXIDINE GLUCONATE 0.12 % MT SOLN
15.0000 mL | Freq: Once | OROMUCOSAL | Status: AC
  Administered 2024-06-11: 15 mL via OROMUCOSAL

## 2024-06-11 MED ORDER — ONDANSETRON HCL 4 MG/2ML IJ SOLN
INTRAMUSCULAR | Status: DC | PRN
  Administered 2024-06-11: 4 mg via INTRAVENOUS

## 2024-06-11 MED ORDER — RIVAROXABAN 10 MG PO TABS
10.0000 mg | ORAL_TABLET | Freq: Every day | ORAL | Status: DC
  Administered 2024-06-12: 10 mg via ORAL
  Filled 2024-06-11: qty 1

## 2024-06-11 MED ORDER — DEXAMETHASONE SOD PHOSPHATE PF 10 MG/ML IJ SOLN
10.0000 mg | Freq: Once | INTRAMUSCULAR | Status: AC
  Administered 2024-06-12: 10 mg via INTRAVENOUS

## 2024-06-11 MED ORDER — METHOCARBAMOL 500 MG PO TABS
500.0000 mg | ORAL_TABLET | Freq: Four times a day (QID) | ORAL | Status: DC | PRN
  Administered 2024-06-11 – 2024-06-12 (×3): 500 mg via ORAL
  Filled 2024-06-11 (×3): qty 1

## 2024-06-11 MED ORDER — HYDROMORPHONE HCL 2 MG PO TABS
2.0000 mg | ORAL_TABLET | ORAL | Status: DC | PRN
  Administered 2024-06-11 (×2): 2 mg via ORAL
  Administered 2024-06-12 (×2): 4 mg via ORAL
  Filled 2024-06-11 (×3): qty 1
  Filled 2024-06-11: qty 2
  Filled 2024-06-11: qty 1

## 2024-06-11 MED ORDER — ORAL CARE MOUTH RINSE: 15.0000 mL | Freq: Once | OROMUCOSAL | Status: AC

## 2024-06-11 MED ORDER — DIPHENHYDRAMINE HCL 12.5 MG/5ML PO ELIX: 12.5000 mg | ORAL_SOLUTION | ORAL | Status: DC | PRN

## 2024-06-11 MED ORDER — LORATADINE 10 MG PO TABS
10.0000 mg | ORAL_TABLET | Freq: Every morning | ORAL | Status: DC
  Administered 2024-06-12: 10 mg via ORAL
  Filled 2024-06-11: qty 1

## 2024-06-11 MED ORDER — METOCLOPRAMIDE HCL 5 MG/ML IJ SOLN: 5.0000 mg | Freq: Three times a day (TID) | INTRAMUSCULAR | Status: DC | PRN

## 2024-06-11 MED ORDER — MIDAZOLAM HCL (PF) 2 MG/2ML IJ SOLN: 1.0000 mg | INTRAMUSCULAR | Status: DC

## 2024-06-11 SURGICAL SUPPLY — 39 items
ATTUNE MED DOME PAT 38 KNEE (Knees) IMPLANT
ATTUNE PS FEM RT SZ 6 CEM KNEE (Femur) IMPLANT
ATTUNE PSRP INSR SZ6 8 KNEE (Insert) IMPLANT
BAG ZIPLOCK 12X15 (MISCELLANEOUS) ×1 IMPLANT
BASE TIBIAL ROT PLAT SZ 5 KNEE (Knees) IMPLANT
BLADE SAG 18X100X1.27 (BLADE) ×1 IMPLANT
BLADE SAW SGTL 11.0X1.19X90.0M (BLADE) ×1 IMPLANT
BNDG ELASTIC 6INX 5YD STR LF (GAUZE/BANDAGES/DRESSINGS) ×1 IMPLANT
BOWL SMART MIX CTS (DISPOSABLE) ×1 IMPLANT
CEMENT HV SMART SET (Cement) ×2 IMPLANT
COVER SURGICAL LIGHT HANDLE (MISCELLANEOUS) ×1 IMPLANT
CUFF TRNQT CYL 34X4.125X (TOURNIQUET CUFF) ×1 IMPLANT
DERMABOND ADVANCED .7 DNX12 (GAUZE/BANDAGES/DRESSINGS) ×1 IMPLANT
DRAPE U-SHAPE 47X51 STRL (DRAPES) ×1 IMPLANT
DRSG AQUACEL AG ADV 3.5X10 (GAUZE/BANDAGES/DRESSINGS) ×1 IMPLANT
DURAPREP 26ML APPLICATOR (WOUND CARE) ×1 IMPLANT
ELECT REM PT RETURN 15FT ADLT (MISCELLANEOUS) ×1 IMPLANT
GLOVE BIO SURGEON STRL SZ 6.5 (GLOVE) IMPLANT
GLOVE BIO SURGEON STRL SZ8 (GLOVE) ×1 IMPLANT
GLOVE BIOGEL PI IND STRL 7.0 (GLOVE) ×1 IMPLANT
GLOVE BIOGEL PI IND STRL 8 (GLOVE) ×1 IMPLANT
GOWN STRL REUS W/ TWL LRG LVL3 (GOWN DISPOSABLE) ×1 IMPLANT
IMMOBILIZER KNEE 20 THIGH 36 (SOFTGOODS) ×1 IMPLANT
KIT TURNOVER KIT A (KITS) ×1 IMPLANT
MANIFOLD NEPTUNE II (INSTRUMENTS) ×1 IMPLANT
NS IRRIG 1000ML POUR BTL (IV SOLUTION) ×1 IMPLANT
PACK TOTAL KNEE CUSTOM (KITS) ×1 IMPLANT
PADDING CAST COTTON 6X4 STRL (CAST SUPPLIES) ×2 IMPLANT
PENCIL SMOKE EVACUATOR (MISCELLANEOUS) ×1 IMPLANT
PIN STEINMAN FIXATION KNEE (PIN) IMPLANT
PROTECTOR NERVE ULNAR (MISCELLANEOUS) ×1 IMPLANT
SET HNDPC FAN SPRY TIP SCT (DISPOSABLE) ×1 IMPLANT
SUT MNCRL AB 4-0 PS2 18 (SUTURE) ×1 IMPLANT
SUT VIC AB 2-0 CT1 TAPERPNT 27 (SUTURE) ×3 IMPLANT
SUTURE STRATFX 0 PDS 27 VIOLET (SUTURE) ×1 IMPLANT
TOWEL GREEN STERILE FF (TOWEL DISPOSABLE) ×1 IMPLANT
TRAY FOLEY MTR SLVR 14FR STAT (SET/KITS/TRAYS/PACK) IMPLANT
TUBE SUCTION HIGH CAP CLEAR NV (SUCTIONS) ×1 IMPLANT
WRAP KNEE MAXI GEL POST OP (GAUZE/BANDAGES/DRESSINGS) ×1 IMPLANT

## 2024-06-11 NOTE — Transfer of Care (Signed)
 Immediate Anesthesia Transfer of Care Note  Patient: Hannah Vasquez  Procedure(s) Performed: ARTHROPLASTY, KNEE, TOTAL (Right: Knee)  Patient Location: PACU  Anesthesia Type:Spinal  Level of Consciousness: sedated  Airway & Oxygen Therapy: Patient Spontanous Breathing and Patient connected to face mask oxygen  Post-op Assessment: Report given to RN and Post -op Vital signs reviewed and stable  Post vital signs: Reviewed and stable  Last Vitals:  Vitals Value Taken Time  BP    Temp    Pulse 61 06/11/24 09:27  Resp 15 06/11/24 09:27  SpO2 99 % 06/11/24 09:27  Vitals shown include unfiled device data.  Last Pain:  Vitals:   06/11/24 0654  TempSrc:   PainSc: 0-No pain         Complications: No notable events documented.

## 2024-06-11 NOTE — Progress Notes (Signed)
   06/11/24 2250  BiPAP/CPAP/SIPAP  BiPAP/CPAP/SIPAP Pt Type Adult  BiPAP/CPAP/SIPAP Resmed  Mask Type Prongs-short  Dentures removed? Not applicable  Mask Size Small  FiO2 (%) 21 %  Patient Home Machine No  Patient Home Mask Yes  Patient Home Tubing Yes  Auto Titrate Yes  Minimum cmH2O 5 cmH2O  Maximum cmH2O 20 cmH2O  Device Plugged into RED Power Outlet Yes

## 2024-06-11 NOTE — Plan of Care (Signed)
  Problem: Education: Goal: Knowledge of General Education information will improve Description: Including pain rating scale, medication(s)/side effects and non-pharmacologic comfort measures Outcome: Progressing   Problem: Health Behavior/Discharge Planning: Goal: Ability to manage health-related needs will improve Outcome: Progressing   Problem: Clinical Measurements: Goal: Ability to maintain clinical measurements within normal limits will improve Outcome: Progressing Goal: Will remain free from infection Outcome: Progressing Goal: Diagnostic test results will improve Outcome: Progressing Goal: Respiratory complications will improve Outcome: Progressing Goal: Cardiovascular complication will be avoided Outcome: Progressing   Problem: Activity: Goal: Risk for activity intolerance will decrease Outcome: Adequate for Discharge   Problem: Nutrition: Goal: Adequate nutrition will be maintained Outcome: Completed/Met   Problem: Coping: Goal: Level of anxiety will decrease Outcome: Progressing   Problem: Elimination: Goal: Will not experience complications related to bowel motility Outcome: Progressing Goal: Will not experience complications related to urinary retention Outcome: Progressing   Problem: Pain Managment: Goal: General experience of comfort will improve and/or be controlled Outcome: Progressing   Problem: Safety: Goal: Ability to remain free from injury will improve Outcome: Progressing   Problem: Skin Integrity: Goal: Risk for impaired skin integrity will decrease Outcome: Progressing   Problem: Education: Goal: Knowledge of the prescribed therapeutic regimen will improve Outcome: Adequate for Discharge Goal: Individualized Educational Video(s) Outcome: Completed/Met   Problem: Activity: Goal: Ability to avoid complications of mobility impairment will improve Outcome: Progressing Goal: Range of joint motion will improve Outcome: Adequate for  Discharge   Problem: Clinical Measurements: Goal: Postoperative complications will be avoided or minimized Outcome: Progressing   Problem: Pain Management: Goal: Pain level will decrease with appropriate interventions Outcome: Progressing   Problem: Skin Integrity: Goal: Will show signs of wound healing Outcome: Progressing

## 2024-06-11 NOTE — Care Plan (Signed)
 Ortho Bundle Case Management Note  Patient Details  Name: Hannah Vasquez MRN: [id number] Date of Birth: 03-Jan-1951  R TKA on 06/11/24   DCP: Home with daughter   DME: No needs  PT: [facility]                DME Arranged:  N/A DME Agency:  NA  HH Arranged:    HH Agency:     Additional Comments: Please contact me with any questions of if this plan should need to change.  Lyle Pepper, CCM [facility]  [phone]  Ext. [phone]   06/11/2024, 8:21 AM

## 2024-06-11 NOTE — Interval H&P Note (Signed)
 History and Physical Interval Note:  06/11/2024 6:26 AM  Hannah Vasquez  has presented today for surgery, with the diagnosis of Right knee osteoarthritis.  The various methods of treatment have been discussed with the patient and family. After consideration of risks, benefits and other options for treatment, the patient has consented to  Procedure(s): ARTHROPLASTY, KNEE, TOTAL (Right) as a surgical intervention.  The patient's history has been reviewed, patient examined, no change in status, stable for surgery.  I have reviewed the patient's chart and labs.  Questions were answered to the patient's satisfaction.     Hannah Vasquez

## 2024-06-11 NOTE — Progress Notes (Signed)
 Orthopedic Tech Progress Note Patient Details:  Hannah Vasquez 1950-10-12 [id number] Applied CPM per order. Will remove at 2:02pm.  CPM Right Knee CPM Right Knee: On Right Knee Flexion (Degrees): 40 Right Knee Extension (Degrees): 10  Post Interventions Patient Tolerated: Well Instructions Provided: Adjustment of device, Care of device, Poper ambulation with device Ortho Devices Type of Ortho Device: CPM padding Ortho Device/Splint Location: RLE Ortho Device/Splint Interventions: Ordered, Application, Adjustment   Post Interventions Patient Tolerated: Well Instructions Provided: Adjustment of device, Care of device, Poper ambulation with device  [name] 06/11/2024, 10:07 AM

## 2024-06-11 NOTE — Op Note (Signed)
 OPERATIVE REPORT-TOTAL KNEE ARTHROPLASTY   Pre-operative diagnosis- Osteoarthritis  Right knee(s)  Post-operative diagnosis- Osteoarthritis Right knee(s)  Procedure-  Right  Total Knee Arthroplasty  Surgeon- Dempsey GAILS. Scotlynn Noyes, MD  Assistant- Corean Sender, PA-C   Anesthesia-  Adductor canal block and spinal  EBL-20 mL   Drains None  Tourniquet time-  Total Tourniquet Time Documented: Thigh (Right) - 32 minutes Total: Thigh (Right) - 32 minutes     Complications- None  Condition-PACU - hemodynamically stable.   Brief Clinical Note  Hannah Vasquez is a 73 y.o. year old female with end stage OA of her right knee with progressively worsening pain and dysfunction. She has constant pain, with activity and at rest and significant functional deficits with difficulties even with ADLs. She has had extensive non-op management including analgesics, injections of cortisone and viscosupplements, and home exercise program, but remains in significant pain with significant dysfunction.Radiographs show bone on bone arthritis medial and patellofemoral. She presents now for right Total Knee Arthroplasty.     Procedure in detail---   The patient is brought into the operating room and positioned supine on the operating table. After successful administration of  Adductor canal block and spinal,   a tourniquet is placed high on the  Right thigh(s) and the lower extremity is prepped and draped in the usual sterile fashion. Time out is performed by the operating team and then the  Right lower extremity is wrapped in Esmarch, knee flexed and the tourniquet inflated to 300 mmHg.       A midline incision is made with a ten blade through the subcutaneous tissue to the level of the extensor mechanism. A fresh blade is used to make a medial parapatellar arthrotomy. Soft tissue over the proximal medial tibia is subperiosteally elevated to the joint line with a knife and into the semimembranosus bursa with a  Cobb elevator. Soft tissue over the proximal lateral tibia is elevated with attention being paid to avoiding the patellar tendon on the tibial tubercle. The patella is everted, knee flexed 90 degrees and the ACL and PCL are removed. Findings are bone on bone medial and patellofemoral with large global osteophytes.        The drill is used to create a starting hole in the distal femur and the canal is thoroughly irrigated with sterile saline to remove the fatty contents. The 5 degree Right  valgus alignment guide is placed into the femoral canal and the distal femoral cutting block is pinned to remove 10 mm off the distal femur. Resection is made with an oscillating saw.      The tibia is subluxed forward and the menisci are removed. The extramedullary alignment guide is placed referencing proximally at the medial aspect of the tibial tubercle and distally along the second metatarsal axis and tibial crest. The block is pinned to remove 2mm off the more deficient medial  side. Resection is made with an oscillating saw. Size 5is the most appropriate size for the tibia and the proximal tibia is prepared with the modular drill and keel punch for that size.      The femoral sizing guide is placed and size 6 is most appropriate. Rotation is marked off the epicondylar axis and confirmed by creating a rectangular flexion gap at 90 degrees. The size 6 cutting block is pinned in this rotation and the anterior, posterior and chamfer cuts are made with the oscillating saw. The intercondylar block is then placed and that cut is made.  Trial size 5 tibial component, trial size 6 posterior stabilized femur and a 8  mm posterior stabilized rotating platform insert trial is placed. Full extension is achieved with excellent varus/valgus and anterior/posterior balance throughout full range of motion. The patella is everted and thickness measured to be 22  mm. Free hand resection is taken to 12 mm, a 38 template is placed, lug  holes are drilled, trial patella is placed, and it tracks normally. Osteophytes are removed off the posterior femur with the trial in place. All trials are removed and the cut bone surfaces prepared with pulsatile lavage. Cement is mixed and once ready for implantation, the size 5 tibial implant, size  6 posterior stabilized femoral component, and the size 38 patella are cemented in place and the patella is held with the clamp. The trial insert is placed and the knee held in full extension. The Exparel  (20 ml mixed with 60 ml saline) is injected into the extensor mechanism, posterior capsule, medial and lateral gutters and subcutaneous tissues.  All extruded cement is removed and once the cement is hard the permanent 8 mm posterior stabilized rotating platform insert is placed into the tibial tray.      The wound is copiously irrigated with saline solution and the extensor mechanism closed with # 0 Stratofix suture. The tourniquet is released for a total tourniquet time of 32  minutes. Flexion against gravity is 140 degrees and the patella tracks normally. Subcutaneous tissue is closed with 2.0 vicryl and subcuticular with running 4.0 Monocryl. The incision is cleaned and dried and steri-strips and a bulky sterile dressing are applied. The limb is placed into a knee immobilizer and the patient is awakened and transported to recovery in stable condition.      Please note that a surgical assistant was a medical necessity for this procedure in order to perform it in a safe and expeditious manner. Surgical assistant was necessary to retract the ligaments and vital neurovascular structures to prevent injury to them and also necessary for proper positioning of the limb to allow for anatomic placement of the prosthesis.   Dempsey ROCKFORD Grover Robinson, MD    06/11/2024, 9:12 AM

## 2024-06-11 NOTE — Anesthesia Procedure Notes (Signed)
 Spinal  Patient location during procedure: OR Start time: 06/11/2024 8:04 AM End time: 06/11/2024 8:07 AM Reason for block: surgical anesthesia Staffing Performed: resident/CRNA  Anesthesiologist: Dorethea Cordella SQUIBB, DO Resident/CRNA: Carleton Garnette SAUNDERS, CRNA Performed by: Carleton Garnette SAUNDERS, CRNA Authorized by: Dorethea Cordella SQUIBB, DO   Preanesthetic Checklist Completed: patient identified, IV checked, site marked, risks and benefits discussed, surgical consent, monitors and equipment checked, pre-op  evaluation and timeout performed Spinal Block Patient position: sitting Prep: DuraPrep Patient monitoring: heart rate, continuous pulse ox, blood pressure and cardiac monitor Approach: midline Location: L3-4 Injection technique: single-shot Needle Needle type: Pencan  Needle gauge: 24 G Needle length: 10 cm Needle insertion depth: 7 cm Assessment Sensory level: T6 Events: CSF return Additional Notes Timeout performed. Patient in sitting position. L3-4 identified. Cleansed with duraprep. SAB without difficulty. To supine position.

## 2024-06-11 NOTE — Progress Notes (Signed)
 Orthopedic Tech Progress Note Patient Details:  Hannah Vasquez [birth year] [id number]  Patient ID: Rock [name], female   DOB: October 13, 1950, 73 y.o.   MRN: [id number] Removed pt from CPM.  [name] 06/11/2024, 2:28 PM

## 2024-06-11 NOTE — Anesthesia Postprocedure Evaluation (Signed)
 Anesthesia Post Note  Patient: Hannah Vasquez  Procedure(s) Performed: ARTHROPLASTY, KNEE, TOTAL (Right: Knee)     Patient location during evaluation: PACU Anesthesia Type: Regional, MAC and Spinal Level of consciousness: awake and alert Pain management: pain level controlled Vital Signs Assessment: post-procedure vital signs reviewed and stable Respiratory status: spontaneous breathing, nonlabored ventilation, respiratory function stable and patient connected to nasal cannula oxygen Cardiovascular status: stable and blood pressure returned to baseline Postop Assessment: no apparent nausea or vomiting Anesthetic complications: no   No notable events documented.  Last Vitals:  Vitals:   06/11/24 1107 06/11/24 1306  BP: 124/73 (!) 140/64  Pulse: (!) 55 66  Resp:  18  Temp: 37.1 C 36.9 C  SpO2: 97% 98%    Last Pain:  Vitals:   06/11/24 1336  TempSrc:   PainSc: 6                  Sriyan Cutting P Ibrohim Simmers

## 2024-06-11 NOTE — Evaluation (Signed)
 Physical Therapy Evaluation Patient Details Name: Hannah Vasquez MRN: [id number] DOB: 1951/07/08 Today's Date: 06/11/2024  History of Present Illness  73 yo female s/p R TKA on 06/11/24. PMH: OA, sarcoidosis, OSA, neuropathy, DDD, NSTEMI, CAD, NASH, RBBB, LTKA 2025, R RCR  Clinical Impression  Pt is s/p TKA resulting in the deficits listed below (see PT Problem List).  Pt  reports RLE in throbbing despite all meds, agreeable to PT. Pt amb 50' with RW and CGA-min assist, distance limited by pain. Anticipate steady progress as long as pain remains controlled.  Pt will benefit from acute skilled PT to increase their independence and safety with mobility to allow discharge.          If plan is discharge home, recommend the following: A little help with walking and/or transfers;A little help with bathing/dressing/bathroom;Help with stairs or ramp for entrance;Assist for transportation;Assistance with cooking/housework   Can travel by private vehicle        Equipment Recommendations None recommended by PT  Recommendations for Other Services       Functional Status Assessment Patient has had a recent decline in their functional status and demonstrates the ability to make significant improvements in function in a reasonable and predictable amount of time.     Precautions / Restrictions Precautions Precautions: Fall;Knee Required Braces or Orthoses: Knee Immobilizer - Left Restrictions Weight Bearing Restrictions Per Provider Order: No Other Position/Activity Restrictions: WBAT      Mobility  Bed Mobility Overal bed mobility: Needs Assistance Bed Mobility: Supine to Sit     Supine to sit: Min assist, Contact guard     General bed mobility comments: cues for technique, incr time needed, light assist to progress RLE off bed    Transfers Overall transfer level: Needs assistance Equipment used: Rolling walker (2 wheels) Transfers: Sit to/from Stand Sit to Stand: Min  assist           General transfer comment: cues for hand placement and RLE position    Ambulation/Gait Ambulation/Gait assistance: Min assist Gait Distance (Feet): 50 Feet Assistive device: Rolling walker (2 wheels) Gait Pattern/deviations: Step-to pattern       General Gait Details: cues for sequence and RW position, light assist to steady  Stairs            Wheelchair Mobility     Tilt Bed    Modified Rankin (Stroke Patients Only)       Balance Overall balance assessment: Mild deficits observed, not formally tested                                           Pertinent Vitals/Pain Pain Assessment Pain Assessment: 0-10 Pain Score: 5  Pain Location: right knee Pain Descriptors / Indicators: Discomfort, Sore, Throbbing Pain Intervention(s): Limited activity within patient's tolerance, Monitored during session, Premedicated before session, Repositioned    Home Living Family/patient expects to be discharged to:: Private residence Living Arrangements: Alone   Type of Home: House Home Access: Level entry     Alternate Level Stairs-Number of Steps: 2 Home Layout: Multi-level Home Equipment: Teacher, English As A Foreign Language (2 wheels);Cane - single point;Shower seat;Grab bars - tub/shower Additional Comments: family assisting for a few wks    Prior Function Prior Level of Function : Independent/Modified Independent             Mobility Comments: independent ADLs Comments: independent  Extremity/Trunk Assessment   Upper Extremity Assessment Upper Extremity Assessment: Overall WFL for tasks assessed    Lower Extremity Assessment Lower Extremity Assessment: RLE deficits/detail RLE Deficits / Details: ankle WFL, knee extension/ hip flexors 3/5, limited by anticipated post op pain and weakness       Communication   Communication Communication: No apparent difficulties    Cognition Arousal: Alert Behavior During Therapy: WFL for  tasks assessed/performed   PT - Cognitive impairments: No apparent impairments                         Following commands: Intact       Cueing Cueing Techniques: Verbal cues     General Comments      Exercises Total Joint Exercises Ankle Circles/Pumps: AROM, Both, 10 reps Quad Sets: Both, 5 reps, AROM   Assessment/Plan    PT Assessment Patient needs continued PT services  PT Problem List Decreased strength;Decreased range of motion;Decreased activity tolerance;Decreased balance;Decreased knowledge of use of DME;Decreased mobility;Pain       PT Treatment Interventions DME instruction;Therapeutic exercise;Gait training;Stair training;Functional mobility training;Therapeutic activities;Patient/family education    PT Goals (Current goals can be found in the Care Plan section)  Acute Rehab PT Goals PT Goal Formulation: With patient Time For Goal Achievement: 06/18/24 Potential to Achieve Goals: Good    Frequency 7X/week     Co-evaluation               AM-PAC PT 6 Clicks Mobility  Outcome Measure Help needed turning from your back to your side while in a flat bed without using bedrails?: A Little Help needed moving from lying on your back to sitting on the side of a flat bed without using bedrails?: A Little Help needed moving to and from a bed to a chair (including a wheelchair)?: A Little Help needed standing up from a chair using your arms (e.g., wheelchair or bedside chair)?: A Little Help needed to walk in hospital room?: A Little Help needed climbing 3-5 steps with a railing? : A Lot 6 Click Score: 17    End of Session Equipment Utilized During Treatment: Gait belt Activity Tolerance: Patient tolerated treatment well Patient left: in chair;with call bell/phone within reach;with chair alarm set;with family/visitor present   PT Visit Diagnosis: Other abnormalities of gait and mobility (R26.89)    Time: 8569-8542 PT Time Calculation (min)  (ACUTE ONLY): 27 min   Charges:   PT Evaluation $PT Eval Low Complexity: 1 Low PT Treatments $Gait Training: 8-22 mins PT General Charges $$ ACUTE PT VISIT: 1 Visit         Taggert Bozzi, PT  Acute Rehab Dept [facility]) [phone]  06/11/2024   [facility] 06/11/2024, 3:54 PM

## 2024-06-11 NOTE — Anesthesia Procedure Notes (Signed)
 Anesthesia Regional Block: Adductor canal block   Pre-Anesthetic Checklist: , timeout performed,  Correct Patient, Correct Site, Correct Laterality,  Correct Procedure, Correct Position, site marked,  Risks and benefits discussed,  Surgical consent,  Pre-op  evaluation,  At surgeon's request and post-op pain management  Laterality: Right  Prep: Dura Prep       Needles:  Injection technique: Single-shot  Needle Type: Echogenic Stimulator Needle     Needle Length: 10cm  Needle Gauge: 20     Additional Needles:   Procedures:,,,, ultrasound used (permanent image in chart),,    Narrative:  Start time: 06/11/2024 7:45 AM End time: 06/11/2024 7:48 AM Injection made incrementally with aspirations every 5 mL.  Performed by: Personally  Anesthesiologist: Dorethea Cordella SQUIBB, DO  Additional Notes: Patient identified. Risks/Benefits/Options discussed with patient including but not limited to bleeding, infection, nerve damage, failed block, incomplete pain control. Patient expressed understanding and wished to proceed. All questions were answered. Sterile technique was used throughout the entire procedure. Please see nursing notes for vital signs. Aspirated in 5cc intervals with injection for negative confirmation. Patient was given instructions on fall risk and not to get out of bed. All questions and concerns addressed with instructions to call with any issues or inadequate analgesia.

## 2024-06-11 NOTE — Discharge Instructions (Addendum)
 Dempsey Moan, MD Total Joint Specialist [facility] [place] [address], [address] [place], [place] [address] [phone]  TOTAL KNEE REPLACEMENT POSTOPERATIVE DIRECTIONS    Knee Rehabilitation, Guidelines Following Surgery  Results after knee surgery are often greatly improved when you follow the exercise, range of motion and muscle strengthening exercises prescribed by your doctor. Safety measures are also important to protect the knee from further injury. If any of these exercises cause you to have increased pain or swelling in your knee joint, decrease the amount until you are comfortable again and slowly increase them. If you have problems or questions, call your caregiver or physical therapist for advice.   BLOOD CLOT PREVENTION Take a 10 mg Xarelto  once a day for three weeks following surgery.  Resume 81 mg Aspirin  daily.  You may resume your vitamins/supplements once you have discontinued the Xarelto . Do not take any NSAIDs (Advil, Aleve , Ibuprofen, Meloxicam , etc.) until you have discontinued the Xarelto .    HOME CARE INSTRUCTIONS  Remove items at home which could result in a fall. This includes throw rugs or furniture in walking pathways.  ICE to the affected knee as much as tolerated. Icing helps control swelling. If the swelling is well controlled you will be more comfortable and rehab easier. Continue to use ice on the knee for pain and swelling from surgery. You may notice swelling that will progress down to the foot and ankle. This is normal after surgery. Elevate the leg when you are not up walking on it.    Continue to use the breathing machine which will help keep your temperature down. It is common for your temperature to cycle up and down following surgery, especially at night when you are not up moving around and exerting yourself. The breathing machine keeps your lungs expanded and your temperature down. Do not place pillow under the operative knee,  focus on keeping the knee straight while resting  DIET You may resume your previous home diet once you are discharged from the hospital.  DRESSING / WOUND CARE / SHOWERING Keep your bulky bandage on for 2 days. On the third post-operative day you may remove the Ace bandage and gauze. There is a waterproof adhesive bandage on your skin which will stay in place until your first follow-up appointment. Once you remove this you will not need to place another bandage You may begin showering 3 days following surgery, but do not submerge the incision under water .  ACTIVITY For the first 5 days, the key is rest and control of pain and swelling Do your home exercises twice a day starting on post-operative day 3. On the days you go to physical therapy, just do the home exercises once that day. You should rest, ice and elevate the leg for 50 minutes out of every hour. Get up and walk/stretch for 10 minutes per hour. After 5 days you can increase your activity slowly as tolerated. Walk with your walker as instructed. Use the walker until you are comfortable transitioning to a cane. Walk with the cane in the opposite hand of the operative leg. You may discontinue the cane once you are comfortable and walking steadily. Avoid periods of inactivity such as sitting longer than an hour when not asleep. This helps prevent blood clots.  You may discontinue the knee immobilizer once you are able to perform a straight leg raise while lying down. You may resume a sexual relationship in one month or when given the OK by your doctor.  You may return to work once you are cleared by your doctor.  Do not drive a car for 6 weeks or until released by your surgeon.  Do not drive while taking narcotics.  TED HOSE STOCKINGS Wear the elastic stockings on both legs for three weeks following surgery during the day. You may remove them at night for sleeping.  WEIGHT BEARING Weight bearing as tolerated with assist device (walker,  cane, etc) as directed, use it as long as suggested by your surgeon or therapist, typically at least 4-6 weeks.  POSTOPERATIVE CONSTIPATION PROTOCOL Constipation - defined medically as fewer than three stools per week and severe constipation as less than one stool per week.  One of the most common issues patients have following surgery is constipation.  Even if you have a regular bowel pattern at home, your normal regimen is likely to be disrupted due to multiple reasons following surgery.  Combination of anesthesia, postoperative narcotics, change in appetite and fluid intake all can affect your bowels.  In order to avoid complications following surgery, here are some recommendations in order to help you during your recovery period.  Colace (docusate) - Pick up an over-the-counter form of Colace or another stool softener and take twice a day as long as you are requiring postoperative pain medications.  Take with a full glass of water  daily.  If you experience loose stools or diarrhea, hold the colace until you stool forms back up. If your symptoms do not get better within 1 week or if they get worse, check with your doctor. Dulcolax (bisacodyl ) - Pick up over-the-counter and take as directed by the product packaging as needed to assist with the movement of your bowels.  Take with a full glass of water .  Use this product as needed if not relieved by Colace only.  MiraLax  (polyethylene glycol) - Pick up over-the-counter to have on hand. MiraLax  is a solution that will increase the amount of water  in your bowels to assist with bowel movements.  Take as directed and can mix with a glass of water , juice, soda, coffee, or tea. Take if you go more than two days without a movement. Do not use MiraLax  more than once per day. Call your doctor if you are still constipated or irregular after using this medication for 7 days in a row.  If you continue to have problems with postoperative constipation, please contact  the office for further assistance and recommendations.  If you experience the worst abdominal pain ever or develop nausea or vomiting, please contact the office immediatly for further recommendations for treatment.  ITCHING If you experience itching with your medications, try taking only a single pain pill, or even half a pain pill at a time.  You can also use Benadryl  over the counter for itching or also to help with sleep.   MEDICATIONS See your medication summary on the "After Visit Summary" that the nursing staff will review with you prior to discharge.  You may have some home medications which will be placed on hold until you complete the course of blood thinner medication.  It is important for you to complete the blood thinner medication as prescribed by your surgeon.  Continue your approved medications as instructed at time of discharge.  PRECAUTIONS If you experience chest pain or shortness of breath - call 911 immediately for transfer to the hospital emergency department.  If you develop a fever greater that 101 F, purulent drainage from wound, increased redness or drainage from  wound, foul odor from the wound/dressing, or calf pain - CONTACT YOUR SURGEON.                                                   FOLLOW-UP APPOINTMENTS Make sure you keep all of your appointments after your operation with your surgeon and caregivers. You should call the office at the above phone number and make an appointment for approximately two weeks after the date of your surgery or on the date instructed by your surgeon outlined in the After Visit Summary.  RANGE OF MOTION AND STRENGTHENING EXERCISES  Rehabilitation of the knee is important following a knee injury or an operation. After just a few days of immobilization, the muscles of the thigh which control the knee become weakened and shrink (atrophy). Knee exercises are designed to build up the tone and strength of the thigh muscles and to improve knee  motion. Often times heat used for twenty to thirty minutes before working out will loosen up your tissues and help with improving the range of motion but do not use heat for the first two weeks following surgery. These exercises can be done on a training (exercise) mat, on the floor, on a table or on a bed. Use what ever works the best and is most comfortable for you Knee exercises include:  Leg Lifts - While your knee is still immobilized in a splint or cast, you can do straight leg raises. Lift the leg to 60 degrees, hold for 3 sec, and slowly lower the leg. Repeat 10-20 times 2-3 times daily. Perform this exercise against resistance later as your knee gets better.  Quad and Hamstring Sets - Tighten up the muscle on the front of the thigh (Quad) and hold for 5-10 sec. Repeat this 10-20 times hourly. Hamstring sets are done by pushing the foot backward against an object and holding for 5-10 sec. Repeat as with quad sets.  Leg Slides: Lying on your back, slowly slide your foot toward your buttocks, bending your knee up off the floor (only go as far as is comfortable). Then slowly slide your foot back down until your leg is flat on the floor again. Angel Wings: Lying on your back spread your legs to the side as far apart as you can without causing discomfort.  A rehabilitation program following serious knee injuries can speed recovery and prevent re-injury in the future due to weakened muscles. Contact your doctor or a physical therapist for more information on knee rehabilitation.   POST-OPERATIVE OPIOID TAPER INSTRUCTIONS: It is important to wean off of your opioid medication as soon as possible. If you do not need pain medication after your surgery it is ok to stop day one. Opioids include: Codeine, Hydrocodone(Norco, Vicodin), Oxycodone (Percocet, oxycontin ) and hydromorphone  amongst others.  Long term and even short term use of opiods can cause: Increased pain  response Dependence Constipation Depression Respiratory depression And more.  Withdrawal symptoms can include Flu like symptoms Nausea, vomiting And more Techniques to manage these symptoms Hydrate well Eat regular healthy meals Stay active Use relaxation techniques(deep breathing, meditating, yoga) Do Not substitute Alcohol to help with tapering If you have been on opioids for less than two weeks and do not have pain than it is ok to stop all together.  Plan to wean off of opioids This plan should start  within one week post op of your joint replacement. Maintain the same interval or time between taking each dose and first decrease the dose.  Cut the total daily intake of opioids by one tablet each day Next start to increase the time between doses. The last dose that should be eliminated is the evening dose.   IF YOU ARE TRANSFERRED TO A SKILLED REHAB FACILITY If the patient is transferred to a skilled rehab facility following release from the hospital, a list of the current medications will be sent to the facility for the patient to continue.  When discharged from the skilled rehab facility, please have the facility set up the patient's Home Health Physical Therapy prior to being released. Also, the skilled facility will be responsible for providing the patient with their medications at time of release from the facility to include their pain medication, the muscle relaxants, and their blood thinner medication. If the patient is still at the rehab facility at time of the two week follow up appointment, the skilled rehab facility will also need to assist the patient in arranging follow up appointment in our office and any transportation needs.  MAKE SURE YOU:  Understand these instructions.  Get help right away if you are not doing well or get worse.   DENTAL ANTIBIOTICS:  In most cases prophylactic antibiotics for Dental procdeures after total joint surgery are not  necessary.  Exceptions are as follows:  1. History of prior total joint infection  2. Severely immunocompromised (Organ Transplant, cancer chemotherapy, Rheumatoid biologic meds such as Humera)  3. Poorly controlled diabetes (A1C &gt; 8.0, blood glucose over 200)  If you have one of these conditions, contact your surgeon for an antibiotic prescription, prior to your dental procedure.    Pick up stool softner and laxative for home use following surgery while on pain medications. Do not submerge incision under water . Please use good hand washing techniques while changing dressing each day. May shower starting three days after surgery. Please use a clean towel to pat the incision dry following showers. Continue to use ice for pain and swelling after surgery. Do not use any lotions or creams on the incision until instructed by your surgeon.        Information on my medicine - XARELTO  (Rivaroxaban )  Why was Xarelto  prescribed for you? Xarelto  was prescribed for you to reduce the risk of blood clots forming after orthopedic surgery. The medical term for these abnormal blood clots is venous thromboembolism (VTE).  What do you need to know about xarelto  ? Take your Xarelto  ONCE DAILY at the same time every day. You may take it either with or without food.  If you have difficulty swallowing the tablet whole, you may crush it and mix in applesauce just prior to taking your dose.  Take Xarelto  exactly as prescribed by your doctor and DO NOT stop taking Xarelto  without talking to the doctor who prescribed the medication.  Stopping without other VTE prevention medication to take the place of Xarelto  may increase your risk of developing a clot.  After discharge, you should have regular check-up appointments with your healthcare provider that is prescribing your Xarelto .    What do you do if you miss a dose? If you miss a dose, take it as soon as you remember on the same day  then continue your regularly scheduled once daily regimen the next day. Do not take two doses of Xarelto  on the same day.   Important Safety Information  A possible side effect of Xarelto  is bleeding. You should call your healthcare provider right away if you experience any of the following: Bleeding from an injury or your nose that does not stop. Unusual colored urine (red or dark brown) or unusual colored stools (red or black). Unusual bruising for unknown reasons. A serious fall or if you hit your head (even if there is no bleeding).  Some medicines may interact with Xarelto  and might increase your risk of bleeding while on Xarelto . To help avoid this, consult your healthcare provider or pharmacist prior to using any new prescription or non-prescription medications, including herbals, vitamins, non-steroidal anti-inflammatory drugs (NSAIDs) and supplements.  This website has more information on Xarelto : [email or web address] .com.

## 2024-06-12 ENCOUNTER — Other Ambulatory Visit (HOSPITAL_COMMUNITY): Payer: Self-pay

## 2024-06-12 ENCOUNTER — Encounter (HOSPITAL_COMMUNITY): Payer: Self-pay | Admitting: Orthopedic Surgery

## 2024-06-12 DIAGNOSIS — M1711 Unilateral primary osteoarthritis, right knee: Secondary | ICD-10-CM | POA: Diagnosis not present

## 2024-06-12 LAB — CBC
HCT: 33.4 % — ABNORMAL LOW (ref 36.0–46.0)
Hemoglobin: 10.3 g/dL — ABNORMAL LOW (ref 12.0–15.0)
MCH: 27.8 pg (ref 26.0–34.0)
MCHC: 30.8 g/dL (ref 30.0–36.0)
MCV: 90 fL (ref 80.0–100.0)
Platelets: 329 K/uL (ref 150–400)
RBC: 3.71 MIL/uL — ABNORMAL LOW (ref 3.87–5.11)
RDW: 13.6 % (ref 11.5–15.5)
WBC: 20.3 K/uL — ABNORMAL HIGH (ref 4.0–10.5)
nRBC: 0 % (ref 0.0–0.2)

## 2024-06-12 LAB — BASIC METABOLIC PANEL WITH GFR
Anion gap: 12 (ref 5–15)
BUN: 11 mg/dL (ref 8–23)
CO2: 21 mmol/L — ABNORMAL LOW (ref 22–32)
Calcium: 9.1 mg/dL (ref 8.9–10.3)
Chloride: 106 mmol/L (ref 98–111)
Creatinine, Ser: 0.77 mg/dL (ref 0.44–1.00)
GFR, Estimated: >60 mL/min (ref >60)
Glucose, Bld: 191 mg/dL — ABNORMAL HIGH (ref 70–99)
Potassium: 4.1 mmol/L (ref 3.5–5.1)
Sodium: 140 mmol/L (ref 135–145)

## 2024-06-12 MED ORDER — HYDROMORPHONE HCL 2 MG PO TABS
2.0000 mg | ORAL_TABLET | Freq: Four times a day (QID) | ORAL | 0 refills | Status: AC | PRN
  Filled 2024-06-12: qty 42, 6d supply, fill #0

## 2024-06-12 MED ORDER — RIVAROXABAN 10 MG PO TABS
10.0000 mg | ORAL_TABLET | Freq: Every day | ORAL | 0 refills | Status: AC
  Filled 2024-06-12: qty 20, 20d supply, fill #0

## 2024-06-12 MED ORDER — METHOCARBAMOL 500 MG PO TABS
500.0000 mg | ORAL_TABLET | Freq: Four times a day (QID) | ORAL | 0 refills | Status: DC | PRN
  Filled 2024-06-12: qty 40, 10d supply, fill #0

## 2024-06-12 MED ORDER — TRAMADOL HCL 50 MG PO TABS
50.0000 mg | ORAL_TABLET | Freq: Four times a day (QID) | ORAL | 0 refills | Status: AC | PRN
  Filled 2024-06-12: qty 40, 5d supply, fill #0

## 2024-06-12 NOTE — Progress Notes (Signed)
 Subjective: 1 Day Post-Op Procedure(s) (LRB): ARTHROPLASTY, KNEE, TOTAL (Right) Patient reports pain as mild.   Patient seen in rounds by Dr. Melodi. Patient is well, and has had no acute complaints or problems No issues overnight. Denies chest pain, SOB, or calf pain. Foley catheter to be removed this AM.  We will continue therapy today, ambulated 50' yesterday.   Objective: Vital signs in last 24 hours: Temp:  [97.3 F (36.3 C)-98.8 F (37.1 C)] 97.4 F (36.3 C) (10/28 0627) Pulse Rate:  [50-73] 62 (10/28 0627) Resp:  [12-18] 16 (10/28 0627) BP: (96-154)/(50-99) 154/82 (10/28 0627) SpO2:  [90 %-100 %] 97 % (10/28 0627) FiO2 (%):  [21 %] 21 % (10/27 2250)  Intake/Output from previous day:  Intake/Output Summary (Last 24 hours) at 06/12/2024 0900 Last data filed at 06/12/2024 0631 Gross per 24 hour  Intake 2517.3 ml  Output 2095 ml  Net 422.3 ml     Intake/Output this shift: No intake/output data recorded.  Labs: Recent Labs    06/12/24 0326  HGB 10.3*   Recent Labs    06/12/24 0326  WBC 20.3*  RBC 3.71*  HCT 33.4*  PLT 329   Recent Labs    06/12/24 0326  NA 140  K 4.1  CL 106  CO2 21*  BUN 11  CREATININE 0.77  GLUCOSE 191*  CALCIUM  9.1   No results for input(s): LABPT, INR in the last 72 hours.  Exam: General - Patient is Alert and Oriented Extremity - Neurologically intact Neurovascular intact Sensation intact distally Dorsiflexion/Plantar flexion intact Dressing - dressing C/D/I Motor Function - intact, moving foot and toes well on exam.   Past Medical History:  Diagnosis Date   Advanced hepatic fibrosis    Allergy    Anxiety    Arthritis    CAD (coronary artery disease)    Cataract    Chronic systolic heart failure (HCC)    DDD (degenerative disc disease), thoracolumbar    Does use hearing aid    bilateral as of 10/05/2019   Elevated alkaline phosphatase level    Gallstones    GERD (gastroesophageal reflux disease)     Hemorrhoids    Hepatic steatosis    History of kidney stones    History of MI (myocardial infarction) 10/07/2017   Hypertension    Mitral valve prolapse    Myocardial infarction (HCC)    NASH (nonalcoholic steatohepatitis)    OSA (obstructive sleep apnea)    Osteoporosis    Pre-diabetes    RBBB    Sarcoidosis    Skin cancer    Face   Status post dilation of esophageal narrowing     Assessment/Plan: 1 Day Post-Op Procedure(s) (LRB): ARTHROPLASTY, KNEE, TOTAL (Right) Principal Problem:   OA (osteoarthritis) of knee  Estimated body mass index is 28.33 kg/m as calculated from the following:   Height as of this encounter: 5' 1 (1.549 m).   Weight as of this encounter: 68 kg. Advance diet Up with therapy D/C IV fluids   Patient's anticipated LOS is less than 2 midnights, meeting these requirements: - Younger than 5 - Lives within 1 hour of care - Has a competent adult at home to recover with post-op recover - NO history of  - Chronic pain requiring opiods  - Diabetes  - Coronary Artery Disease  - Heart failure  - Heart attack  - Stroke  - DVT/VTE  - Cardiac arrhythmia  - Respiratory Failure/COPD  - Renal failure  - Anemia  -  Advanced Liver disease     DVT Prophylaxis - Xarelto  Weight bearing as tolerated. Continue therapy.  Plan is to go Home after hospital stay. Plan for discharge later today if progresses with therapy and meeting goals. Scheduled for OPPT at [facility]. Follow-up in the office in 2 weeks.  The PDMP database was reviewed today prior to any opioid medications being prescribed to this patient.  Roxie Mess, PA-C Orthopedic Surgery [phone] 06/12/2024, 9:00 AM

## 2024-06-12 NOTE — Progress Notes (Signed)
 Discharge medications delivered to patient at he bedside in a secure bag

## 2024-06-12 NOTE — TOC Transition Note (Signed)
 Transition of Care [facility]) - Discharge Note   Patient Details  Name: Hannah Vasquez MRN: [id number] Date of Birth: [birth year]  [facility] CM/SW Contact:  NORMAN ASPEN, LCSW Phone Number: 06/12/2024, 11:45 AM   Clinical Narrative:     Met with pt who confirms she has needed DME in the home.  OPPT already arranged with [facility].  No further IP CM needs.  Final next level of care: OP Rehab Barriers to Discharge: No Barriers Identified   Patient Goals and CMS Choice Patient states their goals for this hospitalization and ongoing recovery are:: return home          Discharge Placement                       Discharge Plan and Services Additional resources added to the After Visit Summary for                  DME Arranged: N/A DME Agency: NA                  Social Drivers of Health (SDOH) Interventions SDOH Screenings   Food Insecurity: No Food Insecurity (06/11/2024)  Housing: Low Risk  (06/11/2024)  Transportation Needs: No Transportation Needs (06/11/2024)  Utilities: Not At Risk (06/11/2024)  Alcohol Screen: Low Risk  (11/11/2020)  Depression (PHQ2-9): Medium Risk (12/08/2021)  Financial Resource Strain: Low Risk  (10/04/2023)   Received from [facility]  Physical Activity: Insufficiently Active (11/11/2020)  Social Connections: Moderately Isolated (06/11/2024)  Stress: No Stress Concern Present (11/11/2020)  Tobacco Use: Low Risk  (06/11/2024)     Readmission Risk Interventions     No data to display

## 2024-06-12 NOTE — Progress Notes (Signed)
 Physical Therapy Treatment Patient Details Name: Hannah Vasquez MRN: [id number] DOB: Oct 29, 1950 Today's Date: 06/12/2024   History of Present Illness 73 yo female s/p R TKA on 06/11/24. PMH: OA, sarcoidosis, OSA, neuropathy, DDD, NSTEMI, CAD, NASH, RBBB, LTKA 2025, R RCR    PT Comments  Pt progressing well this session. Reviewed mobility as below and TKA HEP. dtr present for session.  All questions and concerns addressed regarding PT/mobility   If plan is discharge home, recommend the following: A little help with walking and/or transfers;A little help with bathing/dressing/bathroom;Help with stairs or ramp for entrance;Assist for transportation;Assistance with cooking/housework   Can travel by private vehicle        Equipment Recommendations  None recommended by PT    Recommendations for Other Services       Precautions / Restrictions Precautions Precautions: Fall;Knee Precaution/Restrictions Comments: discussed use of sleeping in KI if needed to maintain knee extensionl reviewed donning/doffing and precautions Required Braces or Orthoses: Knee Immobilizer - Left Knee Immobilizer - Left: Other (comment) Restrictions RLE Weight Bearing Per Provider Order: Weight bearing as tolerated     Mobility  Bed Mobility Overal bed mobility: Needs Assistance Bed Mobility: Supine to Sit     Supine to sit: Supervision     General bed mobility comments: cues to self assist    Transfers Overall transfer level: Needs assistance Equipment used: Rolling walker (2 wheels) Transfers: Sit to/from Stand Sit to Stand: Supervision, Contact guard assist           General transfer comment: cues for hand placement and RLE position    Ambulation/Gait Ambulation/Gait assistance: Contact guard assist, Supervision Gait Distance (Feet): 70 Feet Assistive device: Rolling walker (2 wheels) Gait Pattern/deviations: Step-to pattern, Step-through pattern Gait velocity: decr but functional      General Gait Details: cues for sequence and RW position, progressiong to step through gait with good stability, no LOB   Stairs Stairs: Yes Stairs assistance: Contact guard assist Stair Management: Two rails, Step to pattern Number of Stairs: 2 General stair comments: cues for sequence and technique; good stability, no LOB.  dtr present for session.   Wheelchair Mobility     Tilt Bed    Modified Rankin (Stroke Patients Only)       Balance                                            Communication Communication Communication: No apparent difficulties  Cognition Arousal: Alert Behavior During Therapy: WFL for tasks assessed/performed   PT - Cognitive impairments: No apparent impairments                         Following commands: Intact      Cueing Cueing Techniques: Verbal cues  Exercises Total Joint Exercises Ankle Circles/Pumps: AROM, Both, 10 reps Quad Sets: Both, 5 reps, AROM Heel Slides: AAROM, Right, 10 reps    General Comments        Pertinent Vitals/Pain Pain Assessment Pain Assessment: Faces Faces Pain Scale: Hurts even more Pain Location: right knee Pain Descriptors / Indicators: Discomfort, Sore, Throbbing Pain Intervention(s): Limited activity within patient's tolerance, Monitored during session, Premedicated before session, Repositioned, Ice applied    Home Living  Prior Function            PT Goals (current goals can now be found in the care plan section) Acute Rehab PT Goals PT Goal Formulation: With patient Time For Goal Achievement: 06/18/24 Potential to Achieve Goals: Good Progress towards PT goals: Progressing toward goals    Frequency    7X/week      PT Plan      Co-evaluation              AM-PAC PT 6 Clicks Mobility   Outcome Measure  Help needed turning from your back to your side while in a flat bed without using bedrails?: None Help needed  moving from lying on your back to sitting on the side of a flat bed without using bedrails?: None Help needed moving to and from a bed to a chair (including a wheelchair)?: A Little Help needed standing up from a chair using your arms (e.g., wheelchair or bedside chair)?: A Little Help needed to walk in hospital room?: A Little Help needed climbing 3-5 steps with a railing? : A Little 6 Click Score: 20    End of Session Equipment Utilized During Treatment: Gait belt Activity Tolerance: Patient tolerated treatment well Patient left: in chair;with call bell/phone within reach;with chair alarm set;with family/visitor present Nurse Communication: Mobility status PT Visit Diagnosis: Other abnormalities of gait and mobility (R26.89)     Time: 8968-8940 PT Time Calculation (min) (ACUTE ONLY): 28 min  Charges:    $Gait Training: 8-22 mins $Therapeutic Exercise: 8-22 mins PT General Charges $$ ACUTE PT VISIT: 1 Visit                     Eriyah Fernando, PT  Acute Rehab Dept [facility]) [phone]  06/12/2024    [facility] 06/12/2024, 11:15 AM

## 2024-06-12 NOTE — Care Management Obs Status (Signed)
 MEDICARE OBSERVATION STATUS NOTIFICATION   Patient Details  Name: Hannah Vasquez MRN: [id number] Date of Birth: 1950/10/28   Medicare Observation Status Notification Given:  Yes  Signed by daughter  NORMAN ASPEN, LCSW 06/12/2024, 11:42 AM

## 2024-06-14 DIAGNOSIS — M6281 Muscle weakness (generalized): Secondary | ICD-10-CM | POA: Diagnosis not present

## 2024-06-14 DIAGNOSIS — M25561 Pain in right knee: Secondary | ICD-10-CM | POA: Diagnosis not present

## 2024-06-14 DIAGNOSIS — R2689 Other abnormalities of gait and mobility: Secondary | ICD-10-CM | POA: Diagnosis not present

## 2024-06-18 ENCOUNTER — Other Ambulatory Visit (HOSPITAL_COMMUNITY): Payer: Self-pay

## 2024-06-18 DIAGNOSIS — M25561 Pain in right knee: Secondary | ICD-10-CM | POA: Diagnosis not present

## 2024-06-18 DIAGNOSIS — R2689 Other abnormalities of gait and mobility: Secondary | ICD-10-CM | POA: Diagnosis not present

## 2024-06-18 DIAGNOSIS — M6281 Muscle weakness (generalized): Secondary | ICD-10-CM | POA: Diagnosis not present

## 2024-06-18 NOTE — Discharge Summary (Signed)
 Patient ID: Hannah Vasquez MRN: [id number] DOB/AGE: 1951/01/25 73 y.o.  Admit date: 06/11/2024 Discharge date: 06/12/2024  Admission Diagnoses:  Principal Problem:   OA (osteoarthritis) of knee   Discharge Diagnoses:  Same  Past Medical History:  Diagnosis Date   Advanced hepatic fibrosis    Allergy    Anxiety    Arthritis    CAD (coronary artery disease)    Cataract    Chronic systolic heart failure (HCC)    DDD (degenerative disc disease), thoracolumbar    Does use hearing aid    bilateral as of 10/05/2019   Elevated alkaline phosphatase level    Gallstones    GERD (gastroesophageal reflux disease)    Hemorrhoids    Hepatic steatosis    History of kidney stones    History of MI (myocardial infarction) 10/07/2017   Hypertension    Mitral valve prolapse    Myocardial infarction (HCC)    NASH (nonalcoholic steatohepatitis)    OSA (obstructive sleep apnea)    Osteoporosis    Pre-diabetes    RBBB    Sarcoidosis    Skin cancer    Face   Status post dilation of esophageal narrowing     Surgeries: Procedure(s): ARTHROPLASTY, KNEE, TOTAL on 06/11/2024   Consultants:   Discharged Condition: Improved  Hospital Course: Hannah Vasquez is an 73 y.o. female who was admitted 06/11/2024 for operative treatment ofOA (osteoarthritis) of knee. Patient has severe unremitting pain that affects sleep, daily activities, and work/hobbies. After pre-op  clearance the patient was taken to the operating room on 06/11/2024 and underwent  Procedure(s): ARTHROPLASTY, KNEE, TOTAL.    Patient was given perioperative antibiotics:  Anti-infectives (From admission, onward)    Start     Dose/Rate Route Frequency Ordered Stop   06/11/24 1400  ceFAZolin  (ANCEF ) IVPB 2g/100 mL premix        2 g 200 mL/hr over 30 Minutes Intravenous Every 6 hours 06/11/24 1058 06/11/24 2200   06/11/24 0630  ceFAZolin  (ANCEF ) IVPB 2g/100 mL premix        2 g 200 mL/hr over 30 Minutes Intravenous On call to O.R.  06/11/24 9373 06/11/24 9191        Patient was given sequential compression devices, early ambulation, and chemoprophylaxis to prevent DVT.  Patient benefited maximally from hospital stay and there were no complications.    Recent vital signs: No data found.   Recent laboratory studies: No results for input(s): WBC, HGB, HCT, PLT, NA, K, CL, CO2, BUN, CREATININE, GLUCOSE, INR, CALCIUM  in the last 72 hours.  Invalid input(s): PT, 2   Discharge Medications:   Allergies as of 06/12/2024       Reactions   Promethazine -dm Nausea And Vomiting   Bactroban  [mupirocin  Calcium ]    Facial redness and swelling   Doxycycline  Nausea And Vomiting   Codeine Nausea And Vomiting   Mupirocin  Swelling   Valtrex  [valacyclovir  Hcl] Swelling   Penicillins Nausea And Vomiting, Rash   Tolerated Cephalosporin Date: 02/21/24. Tolerated Cephalosporin Date: 06/12/24.          Medication List     STOP taking these medications    aspirin  EC 81 MG tablet   CALCIUM  PO   D3 PO   FISH OIL  PO   meloxicam  15 MG tablet Commonly known as: MOBIC    MULTIVITAL PO   VITAMIN E PO       TAKE these medications    acetaminophen  500 MG tablet Commonly known as: TYLENOL  Take 500 mg  by mouth every 6 (six) hours as needed for moderate pain.   albuterol  108 (90 Base) MCG/ACT inhaler Commonly known as: VENTOLIN  HFA Inhale 1 puff into the lungs every 6 (six) hours as needed for wheezing or shortness of breath.   amLODipine  5 MG tablet Commonly known as: NORVASC  TAKE ONE TABLET BY MOUTH ONCE A DAY   atorvastatin  80 MG tablet Commonly known as: LIPITOR  TAKE ONE TABLET BY MOUTH DAILY AT SIX IN THE EVENING   B-12 PO Take 1 tablet by mouth daily.   fluticasone  50 MCG/ACT nasal spray Commonly known as: FLONASE  Place 1 spray into both nostrils daily as needed for allergies.   furosemide  20 MG tablet Commonly known as: LASIX  Take 1 tablet (20 mg total) by mouth  daily as needed.   gabapentin  300 MG capsule Commonly known as: NEURONTIN  Take 1 capsule (300 mg total) by mouth 3 (three) times daily. What changed:  how much to take when to take this additional instructions   HYDROmorphone  2 MG tablet Commonly known as: DILAUDID  Take 1-2 tablets (2-4 mg total) by mouth every 6 (six) hours as needed for severe pain (pain score 7-10).   isosorbide  mononitrate 30 MG 24 hr tablet Commonly known as: IMDUR  Take 1 tablet (30 mg total) by mouth daily. What changed: when to take this   loperamide  2 MG capsule Commonly known as: IMODIUM  Take 1 capsule (2 mg total) by mouth 4 (four) times daily as needed for diarrhea or loose stools.   loratadine  10 MG tablet Commonly known as: CLARITIN  Take 1 tablet (10 mg total) by mouth every morning.   losartan  100 MG tablet Commonly known as: COZAAR  TAKE ONE TABLET BY MOUTH ONCE A DAY   methocarbamol  500 MG tablet Commonly known as: ROBAXIN  Take 1 tablet (500 mg total) by mouth every 6 (six) hours as needed for muscle spasms.   metoprolol  succinate 25 MG 24 hr tablet Commonly known as: TOPROL -XL TAKE ONE TABLET BY MOUTH ONCE A DAY   nitroGLYCERIN  0.4 MG SL tablet Commonly known as: NITROSTAT  DISSOLVE ONE TABLET UNDER TONGUE AS NEEDED FOR CHEST PAIN. MAY REPEAT FIVE MINUTES APART THREE TIMES IF NEEDED   omeprazole  40 MG capsule Commonly known as: PRILOSEC TAKE ONE CAPSULE (40 MG TOTAL) BY MOUTH DAILY.   ondansetron  4 MG disintegrating tablet Commonly known as: ZOFRAN -ODT Take 1 tablet (4 mg total) by mouth every 8 (eight) hours as needed.   ondansetron  4 MG tablet Commonly known as: ZOFRAN  Take 1 tablet (4 mg total) by mouth every 6 (six) hours as needed for nausea.   potassium chloride  SA 20 MEQ tablet Commonly known as: KLOR-CON  M TAKE TWO TABLETS BY MOUTH TWICE DAILY What changed: when to take this   spironolactone  25 MG tablet Commonly known as: ALDACTONE  TAKE ONE TABLET (25 MG TOTAL) BY  MOUTH DAILY. (PLEASE CALL TO SCHEDULE ANNUAL APPT FOR MORE REFILLS)   traMADol  50 MG tablet Commonly known as: ULTRAM  Take 1-2 tablets (50-100 mg total) by mouth every 6 (six) hours as needed for moderate pain (pain score 4-6). What changed: how much to take   triamcinolone  cream 0.1 % Commonly known as: KENALOG  Apply 1 Application topically 2 (two) times daily as needed.   UNABLE TO FIND CPAP for OSA   Xarelto  10 MG Tabs tablet Generic drug: rivaroxaban  Take 1 tablet (10 mg total) by mouth daily with breakfast for 20 days. Then resume one 81 mg aspirin  once a day  Discharge Care Instructions  (From admission, onward)           Start     Ordered   06/12/24 0000  Weight bearing as tolerated        06/12/24 0910   06/12/24 0000  Change dressing       Comments: You may remove the bulky bandage (ACE wrap and gauze) two days after surgery. You will have an adhesive waterproof bandage underneath. Leave this in place until your first follow-up appointment.   06/12/24 0910            Diagnostic Studies: No results found.  Disposition: Discharge disposition: 01-Home or Self Care       Discharge Instructions     Call MD / Call 911   Complete by: As directed    If you experience chest pain or shortness of breath, CALL 911 and be transported to the hospital emergency room.  If you develope a fever above 101 F, pus (white drainage) or increased drainage or redness at the wound, or calf pain, call your surgeon's office.   Change dressing   Complete by: As directed    You may remove the bulky bandage (ACE wrap and gauze) two days after surgery. You will have an adhesive waterproof bandage underneath. Leave this in place until your first follow-up appointment.   Constipation Prevention   Complete by: As directed    Drink plenty of fluids.  Prune juice may be helpful.  You may use a stool softener, such as Colace (over the counter) 100 mg twice a day.  Use  MiraLax  (over the counter) for constipation as needed.   Diet - low sodium heart healthy   Complete by: As directed    Do not put a pillow under the knee. Place it under the heel.   Complete by: As directed    Driving restrictions   Complete by: As directed    No driving for two weeks   Post-operative opioid taper instructions:   Complete by: As directed    POST-OPERATIVE OPIOID TAPER INSTRUCTIONS: It is important to wean off of your opioid medication as soon as possible. If you do not need pain medication after your surgery it is ok to stop day one. Opioids include: Codeine, Hydrocodone(Norco, Vicodin), Oxycodone (Percocet, oxycontin ) and hydromorphone  amongst others.  Long term and even short term use of opiods can cause: Increased pain response Dependence Constipation Depression Respiratory depression And more.  Withdrawal symptoms can include Flu like symptoms Nausea, vomiting And more Techniques to manage these symptoms Hydrate well Eat regular healthy meals Stay active Use relaxation techniques(deep breathing, meditating, yoga) Do Not substitute Alcohol to help with tapering If you have been on opioids for less than two weeks and do not have pain than it is ok to stop all together.  Plan to wean off of opioids This plan should start within one week post op of your joint replacement. Maintain the same interval or time between taking each dose and first decrease the dose.  Cut the total daily intake of opioids by one tablet each day Next start to increase the time between doses. The last dose that should be eliminated is the evening dose.      TED hose   Complete by: As directed    Use stockings (TED hose) for three weeks on both leg(s).  You may remove them at night for sleeping.   Weight bearing as tolerated   Complete by: As directed  Follow-up Information     Avital Dancy, Roxie CROME, [place]. Go on 06/26/2024.   Specialty: Orthopedic Surgery Why: You are  scheduled for a post op appointment on Tuesday 06/26/24 at 3:15pm Contact information: [address] [place] [address] [phone]                  Signed: Roxie Mess 06/18/2024, 10:30 AM

## 2024-06-20 DIAGNOSIS — M6281 Muscle weakness (generalized): Secondary | ICD-10-CM | POA: Diagnosis not present

## 2024-06-20 DIAGNOSIS — M25561 Pain in right knee: Secondary | ICD-10-CM | POA: Diagnosis not present

## 2024-06-20 DIAGNOSIS — R2689 Other abnormalities of gait and mobility: Secondary | ICD-10-CM | POA: Diagnosis not present

## 2024-06-22 DIAGNOSIS — R2689 Other abnormalities of gait and mobility: Secondary | ICD-10-CM | POA: Diagnosis not present

## 2024-06-22 DIAGNOSIS — M25561 Pain in right knee: Secondary | ICD-10-CM | POA: Diagnosis not present

## 2024-06-22 DIAGNOSIS — M6281 Muscle weakness (generalized): Secondary | ICD-10-CM | POA: Diagnosis not present

## 2024-06-25 DIAGNOSIS — M25561 Pain in right knee: Secondary | ICD-10-CM | POA: Diagnosis not present

## 2024-06-25 DIAGNOSIS — M6281 Muscle weakness (generalized): Secondary | ICD-10-CM | POA: Diagnosis not present

## 2024-06-25 DIAGNOSIS — R2689 Other abnormalities of gait and mobility: Secondary | ICD-10-CM | POA: Diagnosis not present

## 2024-06-27 DIAGNOSIS — R2689 Other abnormalities of gait and mobility: Secondary | ICD-10-CM | POA: Diagnosis not present

## 2024-06-27 DIAGNOSIS — M6281 Muscle weakness (generalized): Secondary | ICD-10-CM | POA: Diagnosis not present

## 2024-06-27 DIAGNOSIS — M25561 Pain in right knee: Secondary | ICD-10-CM | POA: Diagnosis not present

## 2024-06-28 DIAGNOSIS — M25561 Pain in right knee: Secondary | ICD-10-CM | POA: Diagnosis not present

## 2024-06-28 DIAGNOSIS — M6281 Muscle weakness (generalized): Secondary | ICD-10-CM | POA: Diagnosis not present

## 2024-06-28 DIAGNOSIS — R2689 Other abnormalities of gait and mobility: Secondary | ICD-10-CM | POA: Diagnosis not present

## 2024-07-02 DIAGNOSIS — M5442 Lumbago with sciatica, left side: Secondary | ICD-10-CM | POA: Diagnosis not present

## 2024-07-02 DIAGNOSIS — R2689 Other abnormalities of gait and mobility: Secondary | ICD-10-CM | POA: Diagnosis not present

## 2024-07-02 DIAGNOSIS — I10 Essential (primary) hypertension: Secondary | ICD-10-CM | POA: Diagnosis not present

## 2024-07-02 DIAGNOSIS — R110 Nausea: Secondary | ICD-10-CM | POA: Diagnosis not present

## 2024-07-02 DIAGNOSIS — M25561 Pain in right knee: Secondary | ICD-10-CM | POA: Diagnosis not present

## 2024-07-02 DIAGNOSIS — M5441 Lumbago with sciatica, right side: Secondary | ICD-10-CM | POA: Diagnosis not present

## 2024-07-02 DIAGNOSIS — M6281 Muscle weakness (generalized): Secondary | ICD-10-CM | POA: Diagnosis not present

## 2024-07-02 DIAGNOSIS — G8929 Other chronic pain: Secondary | ICD-10-CM | POA: Diagnosis not present

## 2024-07-04 DIAGNOSIS — R2689 Other abnormalities of gait and mobility: Secondary | ICD-10-CM | POA: Diagnosis not present

## 2024-07-04 DIAGNOSIS — M6281 Muscle weakness (generalized): Secondary | ICD-10-CM | POA: Diagnosis not present

## 2024-07-04 DIAGNOSIS — M25561 Pain in right knee: Secondary | ICD-10-CM | POA: Diagnosis not present

## 2024-07-10 DIAGNOSIS — R2689 Other abnormalities of gait and mobility: Secondary | ICD-10-CM | POA: Diagnosis not present

## 2024-07-10 DIAGNOSIS — M6281 Muscle weakness (generalized): Secondary | ICD-10-CM | POA: Diagnosis not present

## 2024-07-10 DIAGNOSIS — M25561 Pain in right knee: Secondary | ICD-10-CM | POA: Diagnosis not present

## 2024-07-11 DIAGNOSIS — R112 Nausea with vomiting, unspecified: Secondary | ICD-10-CM | POA: Diagnosis not present

## 2024-07-11 DIAGNOSIS — R630 Anorexia: Secondary | ICD-10-CM | POA: Diagnosis not present

## 2024-07-11 DIAGNOSIS — R42 Dizziness and giddiness: Secondary | ICD-10-CM | POA: Diagnosis not present

## 2024-07-11 DIAGNOSIS — R438 Other disturbances of smell and taste: Secondary | ICD-10-CM | POA: Diagnosis not present

## 2024-07-13 DIAGNOSIS — R2689 Other abnormalities of gait and mobility: Secondary | ICD-10-CM | POA: Diagnosis not present

## 2024-07-13 DIAGNOSIS — M25561 Pain in right knee: Secondary | ICD-10-CM | POA: Diagnosis not present

## 2024-07-13 DIAGNOSIS — M6281 Muscle weakness (generalized): Secondary | ICD-10-CM | POA: Diagnosis not present

## 2024-07-17 DIAGNOSIS — M6281 Muscle weakness (generalized): Secondary | ICD-10-CM | POA: Diagnosis not present

## 2024-07-17 DIAGNOSIS — M25561 Pain in right knee: Secondary | ICD-10-CM | POA: Diagnosis not present

## 2024-07-17 DIAGNOSIS — R2689 Other abnormalities of gait and mobility: Secondary | ICD-10-CM | POA: Diagnosis not present

## 2024-07-20 DIAGNOSIS — Z5189 Encounter for other specified aftercare: Secondary | ICD-10-CM | POA: Diagnosis not present

## 2024-07-23 DIAGNOSIS — R2689 Other abnormalities of gait and mobility: Secondary | ICD-10-CM | POA: Diagnosis not present

## 2024-07-23 DIAGNOSIS — M25561 Pain in right knee: Secondary | ICD-10-CM | POA: Diagnosis not present

## 2024-07-23 DIAGNOSIS — M6281 Muscle weakness (generalized): Secondary | ICD-10-CM | POA: Diagnosis not present

## 2024-07-25 DIAGNOSIS — M25561 Pain in right knee: Secondary | ICD-10-CM | POA: Diagnosis not present

## 2024-07-25 DIAGNOSIS — M6281 Muscle weakness (generalized): Secondary | ICD-10-CM | POA: Diagnosis not present

## 2024-07-25 DIAGNOSIS — R2689 Other abnormalities of gait and mobility: Secondary | ICD-10-CM | POA: Diagnosis not present

## 2024-07-30 ENCOUNTER — Ambulatory Visit
Admission: RE | Admit: 2024-07-30 | Discharge: 2024-07-30 | Disposition: A | Source: Ambulatory Visit | Attending: Family Medicine | Admitting: Family Medicine

## 2024-07-30 ENCOUNTER — Other Ambulatory Visit: Payer: Self-pay | Admitting: Family Medicine

## 2024-07-30 DIAGNOSIS — R600 Localized edema: Secondary | ICD-10-CM | POA: Diagnosis present

## 2024-08-03 ENCOUNTER — Other Ambulatory Visit (HOSPITAL_COMMUNITY): Payer: Self-pay

## 2024-08-10 ENCOUNTER — Emergency Department (HOSPITAL_COMMUNITY)

## 2024-08-10 ENCOUNTER — Encounter (HOSPITAL_COMMUNITY): Payer: Self-pay

## 2024-08-10 ENCOUNTER — Other Ambulatory Visit: Payer: Self-pay

## 2024-08-10 ENCOUNTER — Emergency Department (HOSPITAL_COMMUNITY): Admission: EM | Admit: 2024-08-10 | Discharge: 2024-08-10 | Disposition: A | Discharge status: Home / Self Care

## 2024-08-10 DIAGNOSIS — R079 Chest pain, unspecified: Secondary | ICD-10-CM | POA: Insufficient documentation

## 2024-08-10 DIAGNOSIS — M5412 Radiculopathy, cervical region: Secondary | ICD-10-CM | POA: Insufficient documentation

## 2024-08-10 DIAGNOSIS — E876 Hypokalemia: Secondary | ICD-10-CM | POA: Diagnosis not present

## 2024-08-10 DIAGNOSIS — M542 Cervicalgia: Secondary | ICD-10-CM | POA: Diagnosis present

## 2024-08-10 LAB — CBC
HCT: 34.8 % — ABNORMAL LOW (ref 36.0–46.0)
Hemoglobin: 11.4 g/dL — ABNORMAL LOW (ref 12.0–15.0)
MCH: 28.7 pg (ref 26.0–34.0)
MCHC: 32.8 g/dL (ref 30.0–36.0)
MCV: 87.7 fL (ref 80.0–100.0)
Platelets: 311 K/uL (ref 150–400)
RBC: 3.97 MIL/uL (ref 3.87–5.11)
RDW: 13.4 % (ref 11.5–15.5)
WBC: 11 K/uL — ABNORMAL HIGH (ref 4.0–10.5)
nRBC: 0 % (ref 0.0–0.2)

## 2024-08-10 LAB — BASIC METABOLIC PANEL WITH GFR
Anion gap: 11 (ref 5–15)
BUN: 10 mg/dL (ref 8–23)
CO2: 26 mmol/L (ref 22–32)
Calcium: 9.2 mg/dL (ref 8.9–10.3)
Chloride: 103 mmol/L (ref 98–111)
Creatinine, Ser: 0.83 mg/dL (ref 0.44–1.00)
GFR, Estimated: >60 mL/min
Glucose, Bld: 119 mg/dL — ABNORMAL HIGH (ref 70–99)
Potassium: 3.4 mmol/L — ABNORMAL LOW (ref 3.5–5.1)
Sodium: 139 mmol/L (ref 135–145)

## 2024-08-10 LAB — TROPONIN T, HIGH SENSITIVITY
Troponin T High Sensitivity: <15 ng/L (ref 0–19)
Troponin T High Sensitivity: <15 ng/L (ref 0–19)

## 2024-08-10 MED ORDER — ACETAMINOPHEN 500 MG PO TABS
1000.0000 mg | ORAL_TABLET | Freq: Once | ORAL | Status: AC
  Administered 2024-08-10: 1000 mg via ORAL
  Filled 2024-08-10: qty 2

## 2024-08-10 MED ORDER — LIDOCAINE 5 % EX PTCH: 1.0000 | MEDICATED_PATCH | CUTANEOUS | 0 refills | Status: AC

## 2024-08-10 MED ORDER — METHOCARBAMOL 500 MG PO TABS: 500.0000 mg | ORAL_TABLET | Freq: Two times a day (BID) | ORAL | 0 refills | Status: AC

## 2024-08-10 MED ORDER — METHOCARBAMOL 500 MG PO TABS
500.0000 mg | ORAL_TABLET | Freq: Once | ORAL | Status: AC
  Administered 2024-08-10: 500 mg via ORAL
  Filled 2024-08-10: qty 1

## 2024-08-10 MED ORDER — LIDOCAINE 5 % EX PTCH
1.0000 | MEDICATED_PATCH | Freq: Once | CUTANEOUS | Status: DC
  Administered 2024-08-10: 1 via TRANSDERMAL
  Filled 2024-08-10: qty 1

## 2024-08-10 NOTE — ED Provider Triage Note (Signed)
 Emergency Medicine Provider Triage Evaluation Note  Hannah Vasquez , a 73 y.o. female  was evaluated in triage.  Pt complains of back pain, shoulder pain, chest pain, onset this morning when she woke up.  She did take 3 nitroglycerin  at home with no relief of her symptoms.  EMS gave her aspirin .  She does have a history of an MI in the past, a few years ago, but says that those symptoms were different.  Her pain is very reproducible with any movement of her neck or shoulder, and radiates to her left elbow  Review of Systems  Positive: Shoulder pain, neck pain, chest pain Negative: Fevers, chills, lightheadedness  Physical Exam  BP (!) 155/59 (BP Location: Right Arm)   Pulse 77   Temp 98.2 F (36.8 C) (Oral)   Resp 16   Ht 5' 2 (1.575 m)   Wt 63 kg   LMP  (LMP Unknown)   SpO2 95%   BMI 25.42 kg/m  Gen:   Awake, no distress   Resp:  Normal effort  MSK:   Stiffness and difficulty moving the left arm and shoulder due to pain in her neck and shoulder.  Reproducible pain with palpation suprascapular  Medical Decision Making  Medically screening exam initiated at 1:15 PM.  Appropriate orders placed.  Hannah Vasquez was informed that the remainder of the evaluation will be completed by another provider, this initial triage assessment does not replace that evaluation, and the importance of remaining in the ED until their evaluation is complete.  Left shoulder and neck pain with some referred pain to the left chest.  EKG shows no acute ischemia per my read.  Patient is pending labs, reassessment.   Cottie Donnice PARAS, MD 08/10/24 [phone]

## 2024-08-10 NOTE — ED Provider Notes (Signed)
 3:57 PM Assumed care of patient from off-going team. For more details, please see note from same day.  In brief, this is a 73 y.o. female  Likely cervical radiculopathy -s/p robaxin , lidocaine   Plan/Dispo at time of sign-out & ED Course since sign-out: [ ]  delta trop  BP (!) 127/53 (BP Location: Right Arm)   Pulse 60   Temp 98.7 F (37.1 C) (Oral)   Resp 18   Ht 5' 2 (1.575 m)   Wt 63 kg   LMP  (LMP Unknown)   SpO2 96%   BMI 25.42 kg/m    ED Course:   Clinical Course as of 08/10/24 1557  Fri Aug 10, 2024  1317 Echo from 2021: IMPRESSIONS     1. Left ventricular ejection fraction, by estimation, is 60 to 65%. The  left ventricle has normal function. The left ventricle has no regional  wall motion abnormalities. Left ventricular diastolic parameters were  normal.   2. Right ventricular systolic function is normal. The right ventricular  size is normal.   3. The mitral valve is normal in structure. Trivial mitral valve  regurgitation. No evidence of mitral stenosis.   4. The aortic valve is tricuspid. Aortic valve regurgitation is not  visualized. No aortic stenosis is present.   [TY]  1546 Patient reports feeling much improved.  Initial troponin negative.  Awaiting repeat troponin.  Likely discharge if no change. [TY]    Clinical Course User Index [TY] Neysa Caron PARAS, DO   D/w patient. Her pain was in the left neck and left shoulder/arm. Radiated down to L elbow. No trauma/falls. No midline neck pain, no neurologic deficits, no swelling/compartment swelling. Intact radial pulse.  She states she follows with Dr. Sheree for carotid artery calcifications and states she off going is due for a follow-up with him.  Patient has never had any neck surgery, no midline neck tenderness at this time.  Offgoing EDP thought most likely cervical radiculopathy.  To also possible MSK.  She feels better after treatment with Robaxin  and lidocaine .  Patient does have a history of heart attack  per daughter at bedside and so I will advise that she follow-up with her cardiologist outpatient as well.  Patient is overall feeling better.  I advised that if her symptoms return or get worse that she return to the emergency department immediately.  Patient and her daughter both report understanding.  Dispo: DC w/ discharge instructions/return precautions. All questions answered to patient's satisfaction.   ------------------------------- Sid Boning, MD Emergency Medicine  This note was created using dictation software, which may contain spelling or grammatical errors.   Boning Sid SAILOR, MD 08/10/24 2010

## 2024-08-10 NOTE — Discharge Instructions (Addendum)
 Please follow-up with your primary doctor.  He may take over-the-counter medication such as Tylenol  for baseline pain control.  Use the lidocaine  patch and muscle relaxer as as needed.  Please use caution as the muscle relaxer can predispose you a prescription to falls and delirium.  Return for fevers, chills, chest pain, shortness of breath, difficulty breathing, lightheadedness, palpitations, passout numbness tingling changes in sensation in your extremity, weakness or any new or worsening symptoms that are concerning to you. You can follow up with neurosurgery and spine doctors within 1-2 weeks for your symptoms.

## 2024-08-10 NOTE — ED Triage Notes (Signed)
 Pt to er via ems, per ems pt has a hx of stemi, states that she took 3 nitroglycerin  before their arrival without any relief, states that she completed her aspirin  so pt has received 324mg  of aspirin , states that they also gave 100mcg of fentanyl  with some relief.    Pt states that she is here for chest pain and pain in her L arm,

## 2024-08-10 NOTE — ED Provider Notes (Signed)
 " [facility] Provider Note   CSN: [id number] Arrival date & time: 08/10/24  1226     Patient presents with: Chest Pain   Hannah Vasquez is a 73 y.o. female.   73 year old female present emergency department left-sided neck pain rating down to her shoulder and her arm.  Was in usual state of health yesterday.  Went to sleep woke up current condition.  Cannot turn her head without pain.  Was concerned because she has a cardiac history took nitro and aspirin  prior to arrival.  Denies numbness tingling changes in sensation.  No trauma.  No weakness.  No shortness of breath   Chest Pain      Prior to Admission medications  Medication Sig Start Date End Date Taking? Authorizing Provider  lidocaine  (LIDODERM ) 5 % Place 1 patch onto the skin daily. Remove & Discard patch within 12 hours or as directed by MD 08/10/24  Yes Neysa Caron PARAS, DO  methocarbamol  (ROBAXIN ) 500 MG tablet Take 1 tablet (500 mg total) by mouth 2 (two) times daily. 08/10/24  Yes Neysa Caron PARAS, DO  acetaminophen  (TYLENOL ) 500 MG tablet Take 500 mg by mouth every 6 (six) hours as needed for moderate pain.    [provider]  albuterol  (VENTOLIN  HFA) 108 (90 Base) MCG/ACT inhaler Inhale 1 puff into the lungs every 6 (six) hours as needed for wheezing or shortness of breath. 12/12/19   Velma Raisin, MD  amLODipine  (NORVASC ) 5 MG tablet TAKE ONE TABLET BY MOUTH ONCE A DAY 03/12/24   Jeffrie Oneil BROCKS, MD  atorvastatin  (LIPITOR ) 80 MG tablet TAKE ONE TABLET BY MOUTH DAILY AT SIX IN THE EVENING 05/14/24   Jeffrie Oneil BROCKS, MD  Cyanocobalamin  (B-12 PO) Take 1 tablet by mouth daily.    [provider]  fluticasone  (FLONASE ) 50 MCG/ACT nasal spray Place 1 spray into both nostrils daily as needed for allergies.    [provider]  furosemide  (LASIX ) 20 MG tablet Take 1 tablet (20 mg total) by mouth daily as needed. 01/01/19   Jeffrie Oneil BROCKS, MD  gabapentin   (NEURONTIN ) 300 MG capsule Take 1 capsule (300 mg total) by mouth 3 (three) times daily. Patient taking differently: Take 300-600 mg by mouth See admin instructions. 300 mg twice daily, 600 mg at bedtime 05/21/21   Velma Raisin, MD  HYDROmorphone  (DILAUDID ) 2 MG tablet Take 1-2 tablets (2-4 mg total) by mouth every 6 (six) hours as needed for severe pain (pain score 7-10). 06/12/24   Edmisten, Roxie CROME, PA  isosorbide  mononitrate (IMDUR ) 30 MG 24 hr tablet Take 1 tablet (30 mg total) by mouth daily. Patient taking differently: Take 30 mg by mouth every evening. 02/01/24   Jeffrie Oneil BROCKS, MD  loperamide  (IMODIUM ) 2 MG capsule Take 1 capsule (2 mg total) by mouth 4 (four) times daily as needed for diarrhea or loose stools. 05/08/24   Teresa Shelba SAUNDERS, NP  loratadine  (CLARITIN ) 10 MG tablet Take 1 tablet (10 mg total) by mouth every morning. 11/08/13   Aron, Talia M, MD  losartan  (COZAAR ) 100 MG tablet TAKE ONE TABLET BY MOUTH ONCE A DAY 03/12/24   Jeffrie Oneil BROCKS, MD  metoprolol  succinate (TOPROL -XL) 25 MG 24 hr tablet TAKE ONE TABLET BY MOUTH ONCE A DAY 05/14/24   Jeffrie Oneil BROCKS, MD  nitroGLYCERIN  (NITROSTAT ) 0.4 MG SL tablet DISSOLVE ONE TABLET UNDER TONGUE AS NEEDED FOR CHEST PAIN. MAY REPEAT FIVE MINUTES APART THREE TIMES IF  NEEDED 04/27/23   Jeffrie Oneil BROCKS, MD  omeprazole  (PRILOSEC) 40 MG capsule TAKE ONE CAPSULE (40 MG TOTAL) BY MOUTH DAILY. 01/17/24   Pyrtle, Gordy HERO, MD  ondansetron  (ZOFRAN ) 4 MG tablet Take 1 tablet (4 mg total) by mouth every 6 (six) hours as needed for nausea. 02/21/24   Edmisten, Kristie L, PA  ondansetron  (ZOFRAN -ODT) 4 MG disintegrating tablet Take 1 tablet (4 mg total) by mouth every 8 (eight) hours as needed. 05/08/24   White, Shelba SAUNDERS, NP  potassium chloride  SA (KLOR-CON  M) 20 MEQ tablet TAKE TWO TABLETS BY MOUTH TWICE DAILY Patient taking differently: Take 40 mEq by mouth daily. 11/07/23   Jeffrie Oneil BROCKS, MD  spironolactone  (ALDACTONE ) 25 MG tablet TAKE ONE TABLET (25 MG TOTAL)  BY MOUTH DAILY. (PLEASE CALL TO SCHEDULE ANNUAL APPT FOR MORE REFILLS) 03/12/24   Jeffrie Oneil BROCKS, MD  traMADol  (ULTRAM ) 50 MG tablet Take 1-2 tablets (50-100 mg total) by mouth every 6 (six) hours as needed for moderate pain (pain score 4-6). 06/12/24   Edmisten, Kristie L, PA  triamcinolone  cream (KENALOG ) 0.1 % Apply 1 Application topically 2 (two) times daily as needed. 03/25/23   Arloa Suzen RAMAN, NP  UNABLE TO FIND CPAP for OSA    [provider]    Allergies: Promethazine -dm, Bactroban  [mupirocin  calcium ], Doxycycline , Codeine, Mupirocin , Valtrex  [valacyclovir  hcl], and Penicillins    Review of Systems  Cardiovascular:  Positive for chest pain.    Updated Vital Signs BP (!) 127/53 (BP Location: Right Arm)   Pulse 60   Temp 98.7 F (37.1 C) (Oral)   Resp 18   Ht 5' 2 (1.575 m)   Wt 63 kg   LMP  (LMP Unknown)   SpO2 96%   BMI 25.42 kg/m   Physical Exam Vitals and nursing note reviewed.  Constitutional:      General: She is not in acute distress.    Appearance: She is not toxic-appearing.  HENT:     Head: Normocephalic and atraumatic.  Neck:     Comments: No midline tenderness.  Tenderness to the pericervical musculature on the left.  [place] positive.  = Cardiovascular:     Rate and Rhythm: Normal rate and regular rhythm.     Heart sounds: Normal heart sounds.  Pulmonary:     Effort: Pulmonary effort is normal.  Chest:     Chest wall: No tenderness.  Abdominal:     Palpations: Abdomen is soft.  Musculoskeletal:     Cervical back: Normal range of motion and neck supple.     Right lower leg: No edema.     Left lower leg: No edema.  Skin:    General: Skin is warm.     Capillary Refill: Capillary refill takes less than 2 seconds.  Neurological:     Mental Status: She is alert.  Psychiatric:        Mood and Affect: Mood normal.        Behavior: Behavior normal.     (all labs ordered are listed, but only abnormal results are displayed) Labs  Reviewed  BASIC METABOLIC PANEL WITH GFR - Abnormal; Notable for the following components:      Result Value   Potassium 3.4 (*)    Glucose, Bld 119 (*)    All other components within normal limits  CBC - Abnormal; Notable for the following components:   WBC 11.0 (*)    Hemoglobin 11.4 (*)    HCT 34.8 (*)  All other components within normal limits  TROPONIN T, HIGH SENSITIVITY  TROPONIN T, HIGH SENSITIVITY    EKG: EKG Interpretation Date/Time:  Friday August 10 2024 12:36:23 EST Ventricular Rate:  73 PR Interval:  190 QRS Duration:  128 QT Interval:  446 QTC Calculation: 491 R Axis:   87  Text Interpretation: Normal sinus rhythm with sinus arrhythmia Right bundle branch block T wave abnormality, consider inferior ischemia Abnormal ECG When compared with ECG of 30-Jan-2024 10:11, PREVIOUS ECG IS PRESENT No significant changes Confirmed by Cottie Cough [phone]) on 08/10/2024 1:09:41 PM  Radiology: [facility] Chest 2 View Result Date: 08/10/2024 CLINICAL DATA:  Chest pain EXAM: CHEST - 2 VIEW COMPARISON:  February 07, 2024 FINDINGS: The heart size and mediastinal contours are within normal limits. Both lungs are clear. The visualized skeletal structures are unremarkable. IMPRESSION: No active cardiopulmonary disease. Electronically Signed   By: Lynwood Landy Raddle M.D.   On: 08/10/2024 13:40     Procedures   Medications Ordered in the ED  lidocaine  (LIDODERM ) 5 % 1 patch (1 patch Transdermal Patch Applied 08/10/24 1335)  acetaminophen  (TYLENOL ) tablet 1,000 mg (1,000 mg Oral Given 08/10/24 1334)  methocarbamol  (ROBAXIN ) tablet 500 mg (500 mg Oral Given 08/10/24 1334)    Clinical Course as of 08/10/24 1633  Fri Aug 10, 2024  1317 Echo from 2021: IMPRESSIONS     1. Left ventricular ejection fraction, by estimation, is 60 to 65%. The  left ventricle has normal function. The left ventricle has no regional  wall motion abnormalities. Left ventricular diastolic parameters were   normal.   2. Right ventricular systolic function is normal. The right ventricular  size is normal.   3. The mitral valve is normal in structure. Trivial mitral valve  regurgitation. No evidence of mitral stenosis.   4. The aortic valve is tricuspid. Aortic valve regurgitation is not  visualized. No aortic stenosis is present.   [TY]  1546 Patient reports feeling much improved.  Initial troponin negative.  Awaiting repeat troponin.  Likely discharge if no change. [TY]    Clinical Course User Index [TY] Neysa Caron PARAS, DO                                 Medical Decision Making Is a well-appearing 73 year old female presenting emergency department for neck pain.  Afebrile nontachycardic, slightly hypertensive.  Physical exam reassuring neurovascular intact in her left upper extremity.  Equal pulses.  Lung sounds equal.  EKG not consistent with ischemic changes on my independent review.  Initial troponin negative.  Does have a minor leukocytosis unclear significance.  Stable anemia.  Minor low potassium.  Chest x-ray without pneumonia pneumothorax.  Strongly suspect MSK etiology as her pain is quite reproducible with head movement and palpation.  However given her cardiac history screening cardiac labs ordered.  Given lidocaine  patch, Tylenol  and Robaxin .  See course for further MDM and final disposition.  Amount and/or Complexity of Data Reviewed Labs: ordered. Radiology: ordered.  Risk OTC drugs. Prescription drug management.      Final diagnoses:  Cervical radiculopathy    ED Discharge Orders          Ordered    lidocaine  (LIDODERM ) 5 %  Every 24 hours        08/10/24 1549    methocarbamol  (ROBAXIN ) 500 MG tablet  2 times daily        08/10/24 1549  Neysa Caron PARAS, DO 08/10/24 1633  "

## 2024-08-14 ENCOUNTER — Ambulatory Visit: Admitting: Nurse Practitioner

## 2024-08-22 ENCOUNTER — Other Ambulatory Visit: Payer: Self-pay | Admitting: Vascular Surgery

## 2024-08-22 DIAGNOSIS — I6522 Occlusion and stenosis of left carotid artery: Secondary | ICD-10-CM

## 2024-08-27 ENCOUNTER — Ambulatory Visit (HOSPITAL_COMMUNITY)
Admission: RE | Admit: 2024-08-27 | Discharge: 2024-08-27 | Disposition: A | Discharge status: Home / Self Care | Source: Ambulatory Visit | Attending: Surgery | Admitting: Surgery

## 2024-08-27 DIAGNOSIS — I6522 Occlusion and stenosis of left carotid artery: Secondary | ICD-10-CM | POA: Insufficient documentation

## 2024-08-28 ENCOUNTER — Encounter: Payer: Self-pay | Admitting: Cardiology

## 2024-08-28 ENCOUNTER — Ambulatory Visit: Attending: Cardiology | Admitting: Cardiology

## 2024-08-28 VITALS — BP 110/58 | HR 72 | Ht 62.0 in | Wt 137.0 lb

## 2024-08-28 DIAGNOSIS — I5022 Chronic systolic (congestive) heart failure: Secondary | ICD-10-CM

## 2024-08-28 DIAGNOSIS — I10 Essential (primary) hypertension: Secondary | ICD-10-CM | POA: Diagnosis not present

## 2024-08-28 DIAGNOSIS — I2510 Atherosclerotic heart disease of native coronary artery without angina pectoris: Secondary | ICD-10-CM

## 2024-08-28 NOTE — Progress Notes (Signed)
 " Cardiology Office Note:  .   Date:  08/28/2024  ID:  Hannah Vasquez, DOB [birth year], MRN [id number] PCP: Valora Lynwood FALCON, MD  [facility] Providers Cardiologist:  Oneil Parchment, MD Sleep Medicine:  Wilbert Bihari, MD     History of Present Illness: .   Hannah Vasquez is a 74 y.o. female Discussed the use of AI scribe   History of Present Illness Hannah Vasquez is a 74 year old female who presents for post-hospital follow-up after a total knee replacement.  She is currently in the recovery phase following her total knee replacement in October 2025. She experiences ongoing pain and stiffness, particularly when standing up after sitting for a while.  She has a history of coronary artery disease with ischemic cardiomyopathy. She experienced a STEMI in May 2018, which was treated with a stent to the LAD. Her ejection fraction was initially reduced to 40% but normalized after PCI. Her last echocardiogram in December 2021 showed an ejection fraction of 65% with trivial mitral regurgitation. No current chest pain or related symptoms are reported.  Her current medications include atorvastatin  80 mg, isosorbide  30 mg, Lasix , Xuriden 100 mg, Toprol  25 mg, spironolactone  25 mg, and losartan . She is concerned about the number of medications and potential duplications, especially since her heart function has improved.  She was in the emergency department the day after Christmas due to cervical radiculopathy, experiencing neck pain and numbness on the left side. An ultrasound was performed, and she is scheduled to see Doctor Gari next week for follow-up.  Her recent lab results include a creatinine level of 0.8, hemoglobin of 11.4, and LDL cholesterol of 69. She lives two hours away from her sister, who helps coordinate her medical care.     Studies Reviewed: .        Results Labs Creatinine (07/2024): 0.8 Hemoglobin (07/2024): 11.4 LDL (07/2024): 69  Diagnostic Echocardiogram (2021):  Ejection fraction 65%, trivial mitral regurgitation, normal left ventricular systolic function Carotid Doppler (08/27/2024): Mild stenosis 1-39% bilaterally Risk Assessment/Calculations:            Physical Exam:   VS:  BP (!) 110/58   Pulse 72   Ht 5' 2 (1.575 m)   Wt 137 lb (62.1 kg)   LMP  (LMP Unknown)   SpO2 98%   BMI 25.06 kg/m    Wt Readings from Last 3 Encounters:  08/28/24 137 lb (62.1 kg)  08/10/24 139 lb (63 kg)  06/11/24 149 lb 14.6 oz (68 kg)    GEN: Well nourished, well developed in no acute distress NECK: No JVD; No carotid bruits CARDIAC: RRR, no murmurs, no rubs, no gallops RESPIRATORY:  Clear to auscultation without rales, wheezing or rhonchi  ABDOMEN: Soft, non-tender, non-distended EXTREMITIES:  No edema; No deformity   ASSESSMENT AND PLAN: .    Assessment and Plan Assessment & Plan Coronary artery disease with resolved ischemic cardiomyopathy, post-PCI Coronary artery disease with ischemic cardiomyopathy, status post-PCI in May 2018. Ejection fraction normalized post-PCI. No current chest pain or anginal symptoms. LDL cholesterol is well-controlled at 69 mg/dL. Current medications include atorvastatin , isosorbide , and others. Consideration to simplify medication regimen due to lack of anginal symptoms and normal ejection fraction. - Discontinued isosorbide  due to lack of anginal symptoms. - Continue atorvastatin  80 mg daily. - Monitor for any recurrence of chest pain or anginal symptoms.  Mild bilateral carotid artery stenosis Mild stenosis (1-39%) bilaterally on recent carotid Doppler. No acute symptoms reported.  Hypertension Blood pressure is well-controlled at 110/58 mmHg. Current medications include losartan , spironolactone , and amlodipine . Plan to simplify medication regimen by discontinuing amlodipine  due to well-controlled blood pressure and lack of cardiac benefit. - Discontinued amlodipine  5 mg daily. - Monitor blood pressure at home and  record daily readings. - Report if blood pressure consistently exceeds 140 mmHg systolic.  Hyperlipidemia LDL cholesterol is well-controlled at 69 mg/dL with current atorvastatin  therapy. - Continue atorvastatin  80 mg daily.  We will see how she does off of the amlodipine  5 mg and the isosorbide  30 mg.  If blood pressure elevates significantly, she will let us  know.       Dispo: 1 yr  Signed, Oneil Parchment, MD  "

## 2024-08-28 NOTE — Patient Instructions (Signed)
 Medication Instructions:  Please discontinue your Imdur  and Amlodipine . Continue all other medications as listed.  *If you need a refill on your cardiac medications before your next appointment, please call your pharmacy*  Follow-Up: At [facility], you and your health needs are our priority.  As part of our continuing mission to provide you with exceptional heart care, our providers are all part of one team.  This team includes your primary Cardiologist (physician) and Advanced Practice Providers or APPs (Physician Assistants and Nurse Practitioners) who all work together to provide you with the care you need, when you need it.  Your next appointment:   1 year(s)  Provider:   Oneil Parchment, MD    We recommend signing up for the patient portal called MyChart.  Sign up information is provided on this After Visit Summary.  MyChart is used to connect with patients for Virtual Visits (Telemedicine).  Patients are able to view lab/test results, encounter notes, upcoming appointments, etc.  Non-urgent messages can be sent to your provider as well.   To learn more about what you can do with MyChart, go to [email or web address].

## 2024-09-04 ENCOUNTER — Encounter: Payer: Self-pay | Admitting: Vascular Surgery

## 2024-09-04 ENCOUNTER — Ambulatory Visit: Attending: Vascular Surgery | Admitting: Vascular Surgery

## 2024-09-04 VITALS — BP 112/76 | HR 76 | Temp 97.9°F | Ht 62.0 in | Wt 135.0 lb

## 2024-09-04 DIAGNOSIS — I6523 Occlusion and stenosis of bilateral carotid arteries: Secondary | ICD-10-CM

## 2024-09-04 NOTE — Progress Notes (Signed)
 "  Patient ID: Hannah Vasquez, female   DOB: 01-03-1951, 74 y.o.   MRN: [id number]  Reason for Consult: Follow-up   Referred by Valora Lynwood FALCON, MD  Subjective:     HPI:  Hannah Vasquez is a 74 y.o. female has a history of known carotid artery stenosis with dizziness but no history of stroke or TIA.  She does have hypertension and hyperlipidemia as well as coronary artery disease for which she is followed with Dr. Jeffrie.  More recently she was having left neck pain radiating down her left arm and also has back pain and was concerned the symptoms were related to her carotid arteries.  She currently takes a statin.  She has lost significant weight after bilateral knee replacements last year and states that her heart function has improved.  Past Medical History:  Diagnosis Date   Advanced hepatic fibrosis    Allergy    Anxiety    Arthritis    CAD (coronary artery disease)    Cataract    Chronic systolic heart failure (HCC)    DDD (degenerative disc disease), thoracolumbar    Does use hearing aid    bilateral as of 10/05/2019   Elevated alkaline phosphatase level    Gallstones    GERD (gastroesophageal reflux disease)    Hemorrhoids    Hepatic steatosis    History of kidney stones    History of MI (myocardial infarction) 10/07/2017   Hypertension    Mitral valve prolapse    Myocardial infarction (HCC)    NASH (nonalcoholic steatohepatitis)    OSA (obstructive sleep apnea)    Osteoporosis    Pre-diabetes    RBBB    Sarcoidosis    Skin cancer    Face   Status post dilation of esophageal narrowing    Family History  Problem Relation Age of Onset   Mitral valve prolapse Mother    Hypertension Mother    Hypertension Father    Brain cancer Father    Colon cancer Neg Hx    Stomach cancer Neg Hx    Esophageal cancer Neg Hx    Pancreatic cancer Neg Hx    Past Surgical History:  Procedure Laterality Date   BASAL CELL CARCINOMA EXCISION  02/05/2016   Dr. Jadine, [facility]  Dermatology   BLADDER SURGERY  11/15/1995   bladder neck suspension, urinary incontinence   CARDIAC CATHETERIZATION  01/03/2017 DES LAD   CATARACT EXTRACTION Left 04/30/2016   Dr. Milan, [facility]   CORONARY STENT INTERVENTION N/A 01/03/2017   Procedure: Coronary Stent Intervention;  Surgeon: Mady Bruckner, MD;  Location: [facility];  Service: Cardiovascular;  Laterality: N/A;   CORONARY ULTRASOUND/IVUS N/A 01/03/2017   Procedure: Intravascular Ultrasound/IVUS;  Surgeon: Mady Bruckner, MD;  Location: [facility];  Service: Cardiovascular;  Laterality: N/A;   DENTAL SURGERY  06/20/2016   Tooth Implant; Dr. Ann   KNEE SURGERY  2003   torn meniscus   LEFT HEART CATH AND CORONARY ANGIOGRAPHY N/A 01/03/2017   Procedure: Left Heart Cath and Coronary Angiography;  Surgeon: Mady Bruckner, MD;  Location: [facility];  Service: Cardiovascular;  Laterality: N/A;   MOHS SURGERY Right 2017   right side of face   ROTATOR CUFF REPAIR     SHOULDER SURGERY Right 2005   TOTAL KNEE ARTHROPLASTY Left 02/20/2024   Procedure: ARTHROPLASTY, KNEE, TOTAL;  Surgeon: Melodi Lerner, MD;  Location: [facility];  Service: Orthopedics;  Laterality: Left;   TOTAL  KNEE ARTHROPLASTY Right 06/11/2024   Procedure: ARTHROPLASTY, KNEE, TOTAL;  Surgeon: Melodi Lerner, MD;  Location: [facility];  Service: Orthopedics;  Laterality: Right;    Short Social History:  Social History   Tobacco Use   Smoking status: Never   Smokeless tobacco: Never  Substance Use Topics   Alcohol use: No    Alcohol/week: 0.0 standard drinks of alcohol    Allergies[1]  Current Outpatient Medications  Medication Sig Dispense Refill   acetaminophen  (TYLENOL ) 500 MG tablet Take 500 mg by mouth every 6 (six) hours as needed for moderate pain.     albuterol  (VENTOLIN  HFA) 108 (90 Base) MCG/ACT inhaler Inhale 1 puff into the lungs every 6 (six) hours as needed for wheezing or shortness of breath. 18 g 1   atorvastatin   (LIPITOR ) 80 MG tablet TAKE ONE TABLET BY MOUTH DAILY AT SIX IN THE EVENING 90 tablet 2   Cyanocobalamin  (B-12 PO) Take 1 tablet by mouth daily.     fluticasone  (FLONASE ) 50 MCG/ACT nasal spray Place 1 spray into both nostrils daily as needed for allergies.     furosemide  (LASIX ) 20 MG tablet Take 1 tablet (20 mg total) by mouth daily as needed. 90 tablet 3   gabapentin  (NEURONTIN ) 300 MG capsule Take 1 capsule (300 mg total) by mouth 3 (three) times daily. (Patient taking differently: Take 300-600 mg by mouth See admin instructions. 300 mg twice daily, 600 mg at bedtime) 270 capsule 3   HYDROmorphone  (DILAUDID ) 2 MG tablet Take 1-2 tablets (2-4 mg total) by mouth every 6 (six) hours as needed for severe pain (pain score 7-10). (Patient not taking: Reported on 08/28/2024) 42 tablet 0   lidocaine  (LIDODERM ) 5 % Place 1 patch onto the skin daily. Remove & Discard patch within 12 hours or as directed by MD 14 patch 0   loperamide  (IMODIUM ) 2 MG capsule Take 1 capsule (2 mg total) by mouth 4 (four) times daily as needed for diarrhea or loose stools. 12 capsule 0   loratadine  (CLARITIN ) 10 MG tablet Take 1 tablet (10 mg total) by mouth every morning. 30 tablet 2   losartan  (COZAAR ) 100 MG tablet TAKE ONE TABLET BY MOUTH ONCE A DAY 90 tablet 3   methocarbamol  (ROBAXIN ) 500 MG tablet Take 1 tablet (500 mg total) by mouth 2 (two) times daily. (Patient not taking: Reported on 08/28/2024) 20 tablet 0   metoprolol  succinate (TOPROL -XL) 25 MG 24 hr tablet TAKE ONE TABLET BY MOUTH ONCE A DAY 90 tablet 2   nitroGLYCERIN  (NITROSTAT ) 0.4 MG SL tablet DISSOLVE ONE TABLET UNDER TONGUE AS NEEDED FOR CHEST PAIN. MAY REPEAT FIVE MINUTES APART THREE TIMES IF NEEDED 25 tablet 0   omeprazole  (PRILOSEC) 40 MG capsule TAKE ONE CAPSULE (40 MG TOTAL) BY MOUTH DAILY. 30 capsule 11   ondansetron  (ZOFRAN ) 4 MG tablet Take 1 tablet (4 mg total) by mouth every 6 (six) hours as needed for nausea. 20 tablet 0   ondansetron  (ZOFRAN -ODT) 4  MG disintegrating tablet Take 1 tablet (4 mg total) by mouth every 8 (eight) hours as needed. (Patient not taking: Reported on 08/28/2024) 20 tablet 0   potassium chloride  SA (KLOR-CON  M) 20 MEQ tablet TAKE TWO TABLETS BY MOUTH TWICE DAILY (Patient taking differently: Take 40 mEq by mouth daily.) 360 tablet 1   spironolactone  (ALDACTONE ) 25 MG tablet TAKE ONE TABLET (25 MG TOTAL) BY MOUTH DAILY. (PLEASE CALL TO SCHEDULE ANNUAL APPT FOR MORE REFILLS) 90 tablet 3   traMADol  (ULTRAM ) 50  MG tablet Take 1-2 tablets (50-100 mg total) by mouth every 6 (six) hours as needed for moderate pain (pain score 4-6). 40 tablet 0   triamcinolone  cream (KENALOG ) 0.1 % Apply 1 Application topically 2 (two) times daily as needed. 160 g 0   UNABLE TO FIND CPAP for OSA     No current facility-administered medications for this visit.    Review of Systems  Constitutional:  Constitutional negative. HENT: HENT negative.  Eyes: Eyes negative.  Respiratory: Respiratory negative.  Cardiovascular: Cardiovascular negative.  GI: Gastrointestinal negative.  Musculoskeletal:       Left neck and shoulder pain Skin: Skin negative.  Neurological: Positive for dizziness and focal weakness.  Hematologic: Hematologic/lymphatic negative.  Psychiatric: Psychiatric negative.        Objective:  Objective   Vitals:   09/04/24 1133  BP: 112/76  Pulse: 76  Temp: 97.9 F (36.6 C)  SpO2: 96%  Weight: 135 lb (61.2 kg)  Height: 5' 2 (1.575 m)   Body mass index is 24.69 kg/m.  Physical Exam HENT:     Head: Normocephalic.     Nose: Nose normal.  Eyes:     Pupils: Pupils are equal, round, and reactive to light.  Neck:     Vascular: No carotid bruit.  Cardiovascular:     Rate and Rhythm: Normal rate.     Pulses: Normal pulses.     Heart sounds: No murmur heard. Pulmonary:     Breath sounds: Normal breath sounds.  Abdominal:     General: Abdomen is flat.  Musculoskeletal:        General: Normal range of motion.      Cervical back: Normal range of motion.     Right lower leg: No edema.     Left lower leg: No edema.  Skin:    General: Skin is warm and dry.     Capillary Refill: Capillary refill takes less than 2 seconds.  Neurological:     General: No focal deficit present.     Mental Status: She is alert.  Psychiatric:        Mood and Affect: Mood normal.     Data: Right Carotid Findings:  +----------+--------+--------+--------+---------------------+--------+           PSV cm/sEDV cm/sStenosisPlaque Description   Comments  +----------+--------+--------+--------+---------------------+--------+  CCA Prox  78      9                                              +----------+--------+--------+--------+---------------------+--------+  CCA Distal69      13                                             +----------+--------+--------+--------+---------------------+--------+  ICA Prox  80      20      1-39%   focal and homogeneous          +----------+--------+--------+--------+---------------------+--------+  ICA Mid   80      21                                             +----------+--------+--------+--------+---------------------+--------+  ICA Distal49  13                                             +----------+--------+--------+--------+---------------------+--------+  ECA      88      6                                              +----------+--------+--------+--------+---------------------+--------+   +----------+--------+-------+----------------+-------------------+           PSV cm/sEDV cmsDescribe        Arm Pressure (mmHG)  +----------+--------+-------+----------------+-------------------+  [id number]    0      Multiphasic, [id number]                  +----------+--------+-------+----------------+-------------------+   +---------+--------+--+--------+-+---------+  VertebralPSV cm/s50EDV cm/s9Antegrade   +---------+--------+--+--------+-+---------+      Left Carotid Findings:  +----------+--------+--------+--------+----------------------+-------------  ----+           PSV cm/sEDV cm/sStenosisPlaque Description    Comments            +----------+--------+--------+--------+----------------------+-------------  ----+  CCA Prox  109     17                                                        +----------+--------+--------+--------+----------------------+-------------  ----+  CCA Distal73      13                                                        +----------+--------+--------+--------+----------------------+-------------  ----+  ICA Prox  126     38      1-39%   focal and heterogenoushigh end of  range  +----------+--------+--------+--------+----------------------+-------------  ----+  ICA Mid   153     21                                                        +----------+--------+--------+--------+----------------------+-------------  ----+  ICA Distal90      16                                                        +----------+--------+--------+--------+----------------------+-------------  ----+  ECA      80      0                                                         +----------+--------+--------+--------+----------------------+-------------  ----+   +----------+--------+--------+----------------+-------------------+  PSV cm/sEDV cm/sDescribe        Arm Pressure (mmHG)  +----------+--------+--------+----------------+-------------------+  Subclavian114    0       Multiphasic, TWO884                  +----------+--------+--------+----------------+-------------------+   +---------+--------+--+--------+--+---------+  VertebralPSV cm/s70EDV cm/s14Antegrade  +---------+--------+--+--------+--+---------+         Summary:  Right Carotid: Velocities in the right ICA are consistent with a  1-39%  stenosis.   Left Carotid: Velocities in the left ICA are consistent with a 1-39%  stenosis.   Vertebrals: Bilateral vertebral arteries demonstrate antegrade flow.  Subclavians: Normal flow hemodynamics were seen in bilateral subclavian               arteries.      Assessment/Plan:     74 year old female with mild bilateral carotid artery stenosis that remains asymptomatic.  Previously the left carotid velocity was slightly higher placing her in the 40 to 59% range but she was always at the low range and the velocities but most recent testing place her into the 1 to 39% category.  As such we can see her back in 2 years with repeat carotid duplex.  We discussed the signs and symptoms of stroke and she demonstrates good understanding.     Penne Lonni Colorado MD [facility]      [1]  Allergies Allergen Reactions   Promethazine -Dm Nausea And Vomiting   Bactroban  [Mupirocin  Calcium ]     Facial redness and swelling   Doxycycline  Nausea And Vomiting   Codeine Nausea And Vomiting   Mupirocin  Swelling   Valtrex  [Valacyclovir  Hcl] Swelling   Penicillins Nausea And Vomiting and Rash    Tolerated Cephalosporin Date: 02/21/24. Tolerated Cephalosporin Date: 06/12/24.       "

## 2024-09-18 ENCOUNTER — Other Ambulatory Visit (HOSPITAL_COMMUNITY): Payer: Self-pay
